# Patient Record
Sex: Male | Born: 1970 | Race: Black or African American | Hispanic: No | Marital: Married | Smoking: Former smoker
Health system: Southern US, Community
[De-identification: ages and names within clinical notes are randomized; demographics above are authoritative.]

## PROBLEM LIST (undated history)

## (undated) DIAGNOSIS — K219 Gastro-esophageal reflux disease without esophagitis: Secondary | ICD-10-CM

## (undated) DIAGNOSIS — C9000 Multiple myeloma not having achieved remission: Secondary | ICD-10-CM

## (undated) DIAGNOSIS — Z923 Personal history of irradiation: Secondary | ICD-10-CM

## (undated) DIAGNOSIS — C801 Malignant (primary) neoplasm, unspecified: Secondary | ICD-10-CM

## (undated) DIAGNOSIS — Z828 Family history of other disabilities and chronic diseases leading to disablement, not elsewhere classified: Secondary | ICD-10-CM

## (undated) DIAGNOSIS — IMO0002 Reserved for concepts with insufficient information to code with codable children: Secondary | ICD-10-CM

## (undated) DIAGNOSIS — IMO0001 Reserved for inherently not codable concepts without codable children: Secondary | ICD-10-CM

## (undated) DIAGNOSIS — Z8489 Family history of other specified conditions: Secondary | ICD-10-CM

## (undated) HISTORY — DX: Personal history of irradiation: Z92.3

## (undated) HISTORY — PX: PORTACATH PLACEMENT: SHX2246

---

## 1997-12-31 ENCOUNTER — Encounter: Admission: RE | Admit: 1997-12-31 | Discharge: 1997-12-31 | Payer: Self-pay | Admitting: *Deleted

## 1998-06-04 ENCOUNTER — Encounter: Payer: Self-pay | Admitting: Emergency Medicine

## 1998-06-04 ENCOUNTER — Emergency Department (HOSPITAL_COMMUNITY): Admission: EM | Admit: 1998-06-04 | Discharge: 1998-06-04 | Payer: Self-pay | Admitting: Emergency Medicine

## 1998-06-06 ENCOUNTER — Emergency Department (HOSPITAL_COMMUNITY): Admission: EM | Admit: 1998-06-06 | Discharge: 1998-06-06 | Payer: Self-pay

## 1999-01-20 ENCOUNTER — Encounter: Payer: Self-pay | Admitting: Emergency Medicine

## 1999-01-20 ENCOUNTER — Emergency Department (HOSPITAL_COMMUNITY): Admission: EM | Admit: 1999-01-20 | Discharge: 1999-01-20 | Payer: Self-pay | Admitting: Emergency Medicine

## 1999-05-15 ENCOUNTER — Emergency Department (HOSPITAL_COMMUNITY): Admission: EM | Admit: 1999-05-15 | Discharge: 1999-05-15 | Payer: Self-pay | Admitting: Emergency Medicine

## 1999-05-15 ENCOUNTER — Encounter: Payer: Self-pay | Admitting: Emergency Medicine

## 1999-06-19 ENCOUNTER — Emergency Department (HOSPITAL_COMMUNITY): Admission: EM | Admit: 1999-06-19 | Discharge: 1999-06-19 | Payer: Self-pay | Admitting: Emergency Medicine

## 2000-11-05 ENCOUNTER — Emergency Department (HOSPITAL_COMMUNITY): Admission: EM | Admit: 2000-11-05 | Discharge: 2000-11-05 | Payer: Self-pay | Admitting: Emergency Medicine

## 2000-11-05 ENCOUNTER — Encounter: Payer: Self-pay | Admitting: Emergency Medicine

## 2001-09-28 ENCOUNTER — Emergency Department (HOSPITAL_COMMUNITY): Admission: EM | Admit: 2001-09-28 | Discharge: 2001-09-28 | Payer: Self-pay | Admitting: Emergency Medicine

## 2001-10-01 ENCOUNTER — Encounter: Payer: Self-pay | Admitting: Emergency Medicine

## 2001-10-01 ENCOUNTER — Emergency Department (HOSPITAL_COMMUNITY): Admission: EM | Admit: 2001-10-01 | Discharge: 2001-10-01 | Payer: Self-pay | Admitting: *Deleted

## 2010-05-24 ENCOUNTER — Emergency Department (HOSPITAL_COMMUNITY): Admission: EM | Admit: 2010-05-24 | Discharge: 2010-05-24 | Payer: Self-pay | Admitting: Emergency Medicine

## 2010-10-13 LAB — DIFFERENTIAL
Basophils Relative: 1 % (ref 0–1)
Eosinophils Relative: 2 % (ref 0–5)
Lymphocytes Relative: 41 % (ref 12–46)
Monocytes Absolute: 0.2 10*3/uL (ref 0.1–1.0)
Monocytes Relative: 6 % (ref 3–12)
Neutro Abs: 1.8 10*3/uL (ref 1.7–7.7)

## 2010-10-13 LAB — BASIC METABOLIC PANEL
Chloride: 109 mEq/L (ref 96–112)
GFR calc non Af Amer: >60 mL/min (ref >60)
Glucose, Bld: 94 mg/dL (ref 70–99)
Potassium: 4.1 mEq/L (ref 3.5–5.1)
Sodium: 140 mEq/L (ref 135–145)

## 2010-10-13 LAB — URINALYSIS, ROUTINE W REFLEX MICROSCOPIC
Bilirubin Urine: NEGATIVE
Glucose, UA: NEGATIVE mg/dL
Hgb urine dipstick: NEGATIVE
Ketones, ur: NEGATIVE mg/dL
Nitrite: NEGATIVE
Protein, ur: NEGATIVE mg/dL
Specific Gravity, Urine: 1.015 (ref 1.005–1.030)
Urobilinogen, UA: 0.2 mg/dL (ref 0.0–1.0)
pH: 6 (ref 5.0–8.0)

## 2010-10-13 LAB — CBC
HCT: 39.1 % (ref 39.0–52.0)
Hemoglobin: 13.4 g/dL (ref 13.0–17.0)
MCHC: 34.3 g/dL (ref 30.0–36.0)
MCV: 90 fL (ref 78.0–100.0)

## 2011-02-20 ENCOUNTER — Emergency Department (HOSPITAL_COMMUNITY)
Admission: EM | Admit: 2011-02-20 | Discharge: 2011-02-20 | Disposition: A | Discharge status: Home / Self Care | Payer: PRIVATE HEALTH INSURANCE | Attending: Emergency Medicine | Admitting: Emergency Medicine

## 2011-02-20 ENCOUNTER — Emergency Department (HOSPITAL_COMMUNITY): Payer: PRIVATE HEALTH INSURANCE

## 2011-02-20 DIAGNOSIS — S46909A Unspecified injury of unspecified muscle, fascia and tendon at shoulder and upper arm level, unspecified arm, initial encounter: Secondary | ICD-10-CM | POA: Insufficient documentation

## 2011-02-20 DIAGNOSIS — W010XXA Fall on same level from slipping, tripping and stumbling without subsequent striking against object, initial encounter: Secondary | ICD-10-CM | POA: Insufficient documentation

## 2011-02-20 DIAGNOSIS — M25519 Pain in unspecified shoulder: Secondary | ICD-10-CM | POA: Insufficient documentation

## 2011-02-20 DIAGNOSIS — S4980XA Other specified injuries of shoulder and upper arm, unspecified arm, initial encounter: Secondary | ICD-10-CM | POA: Insufficient documentation

## 2011-02-20 DIAGNOSIS — Y92009 Unspecified place in unspecified non-institutional (private) residence as the place of occurrence of the external cause: Secondary | ICD-10-CM | POA: Insufficient documentation

## 2011-04-16 ENCOUNTER — Emergency Department (HOSPITAL_COMMUNITY)
Admission: EM | Admit: 2011-04-16 | Discharge: 2011-04-16 | Disposition: A | Discharge status: Home / Self Care | Payer: PRIVATE HEALTH INSURANCE | Attending: Emergency Medicine | Admitting: Emergency Medicine

## 2011-04-16 DIAGNOSIS — T394X2A Poisoning by antirheumatics, not elsewhere classified, intentional self-harm, initial encounter: Secondary | ICD-10-CM | POA: Insufficient documentation

## 2011-04-16 DIAGNOSIS — T391X1A Poisoning by 4-Aminophenol derivatives, accidental (unintentional), initial encounter: Secondary | ICD-10-CM | POA: Insufficient documentation

## 2011-04-16 DIAGNOSIS — R45851 Suicidal ideations: Secondary | ICD-10-CM | POA: Insufficient documentation

## 2011-04-16 DIAGNOSIS — R109 Unspecified abdominal pain: Secondary | ICD-10-CM | POA: Insufficient documentation

## 2011-04-16 LAB — RAPID URINE DRUG SCREEN, HOSP PERFORMED
Opiates: NOT DETECTED
Tetrahydrocannabinol: NOT DETECTED

## 2011-04-16 LAB — COMPREHENSIVE METABOLIC PANEL
BUN: 16 mg/dL (ref 6–23)
Calcium: 9.4 mg/dL (ref 8.4–10.5)
Creatinine, Ser: 1 mg/dL (ref 0.50–1.35)
GFR calc Af Amer: >60 mL/min (ref >60)
Glucose, Bld: 83 mg/dL (ref 70–99)
Total Protein: 7.1 g/dL (ref 6.0–8.3)

## 2011-04-16 LAB — URINALYSIS, ROUTINE W REFLEX MICROSCOPIC
Hgb urine dipstick: NEGATIVE
Protein, ur: NEGATIVE mg/dL
Urobilinogen, UA: 0.2 mg/dL (ref 0.0–1.0)

## 2011-04-16 LAB — CBC
HCT: 39.7 % (ref 39.0–52.0)
MCHC: 35.3 g/dL (ref 30.0–36.0)
MCV: 88 fL (ref 78.0–100.0)
RDW: 12.4 % (ref 11.5–15.5)

## 2011-04-16 LAB — DIFFERENTIAL
Eosinophils Absolute: 0.1 10*3/uL (ref 0.0–0.7)
Eosinophils Relative: 1 % (ref 0–5)
Lymphocytes Relative: 42 % (ref 12–46)
Lymphs Abs: 1.8 10*3/uL (ref 0.7–4.0)
Monocytes Absolute: 0.4 10*3/uL (ref 0.1–1.0)

## 2011-04-16 LAB — SALICYLATE LEVEL: Salicylate Lvl: <2 mg/dL — ABNORMAL LOW (ref 2.8–20.0)

## 2011-04-16 LAB — ETHANOL: Alcohol, Ethyl (B): <11 mg/dL (ref 0–11)

## 2011-04-17 LAB — ACETAMINOPHEN LEVEL: Acetaminophen (Tylenol), Serum: 24 ug/mL (ref 10–30)

## 2011-11-13 ENCOUNTER — Emergency Department (HOSPITAL_BASED_OUTPATIENT_CLINIC_OR_DEPARTMENT_OTHER)
Admission: EM | Admit: 2011-11-13 | Discharge: 2011-11-13 | Disposition: A | Discharge status: Home / Self Care | Payer: PRIVATE HEALTH INSURANCE | Attending: Emergency Medicine | Admitting: Emergency Medicine

## 2011-11-13 ENCOUNTER — Encounter (HOSPITAL_BASED_OUTPATIENT_CLINIC_OR_DEPARTMENT_OTHER): Payer: Self-pay | Admitting: *Deleted

## 2011-11-13 DIAGNOSIS — H5789 Other specified disorders of eye and adnexa: Secondary | ICD-10-CM | POA: Insufficient documentation

## 2011-11-13 DIAGNOSIS — H11419 Vascular abnormalities of conjunctiva, unspecified eye: Secondary | ICD-10-CM | POA: Insufficient documentation

## 2011-11-13 DIAGNOSIS — H109 Unspecified conjunctivitis: Secondary | ICD-10-CM | POA: Insufficient documentation

## 2011-11-13 MED ORDER — CIPROFLOXACIN HCL 0.3 % OP SOLN
2.0000 [drp] | OPHTHALMIC | Status: DC
  Administered 2011-11-13: 2 [drp] via OPHTHALMIC
  Filled 2011-11-13: qty 2.5

## 2011-11-13 MED ORDER — FLUORESCEIN SODIUM 1 MG OP STRP
1.0000 | ORAL_STRIP | Freq: Once | OPHTHALMIC | Status: DC
  Filled 2011-11-13: qty 1

## 2011-11-13 MED ORDER — TETRACAINE HCL 0.5 % OP SOLN
1.0000 [drp] | Freq: Once | OPHTHALMIC | Status: DC
  Filled 2011-11-13: qty 2

## 2011-11-13 NOTE — ED Provider Notes (Signed)
History     CSN: [id number]  Arrival date & time 11/13/11  2012   First MD Initiated Contact with Patient 11/13/11 2132      Chief Complaint  Patient presents with  . Eye Injury    (Consider location/radiation/quality/duration/timing/severity/associated sxs/prior treatment) HPI Comments: Pt states that he was loading a trunk earlier today and he felt like something flew in his right eye:pt states that he has washed it out and he still has the sensation  Patient is a 41 y.o. male presenting with eye injury. The history is provided by the patient. No language interpreter was used.  Eye Injury This is a new problem. The current episode started today. The problem occurs constantly. The problem has been unchanged. The symptoms are aggravated by nothing. He has tried nothing for the symptoms.    History reviewed. No pertinent past medical history.  History reviewed. No pertinent past surgical history.  History reviewed. No pertinent family history.  History  Substance Use Topics  . Smoking status: Never Smoker   . Smokeless tobacco: Not on file  . Alcohol Use: No      Review of Systems  Constitutional: Negative.   HENT: Negative.   Eyes: Positive for redness.  Respiratory: Negative.   Cardiovascular: Negative.   Neurological: Negative.     Allergies  Review of patient's allergies indicates no known allergies.  Home Medications   Current Outpatient Rx  Name Route Sig Dispense Refill  . ACETAMINOPHEN 500 MG PO TABS Oral Take 1,000 mg by mouth every 6 (six) hours as needed. Patient used this medication for pain.    Marland Kitchen VITAMIN C 100 MG PO TABS Oral Take 100 mg by mouth daily.    . IBUPROFEN 200 MG PO TABS Oral Take 400 mg by mouth every 6 (six) hours as needed. Patient used this medication for pain.    Marland Kitchen ALKA-SELTZER PLUS SINUS PO Oral Take 2 tablets by mouth daily as needed. Patient used this medication for cold and sinus issues.      BP 127/86  Pulse 95  Temp(Src)  99.2 F (37.3 C) (Oral)  Ht 6' (1.829 m)  Wt 200 lb (90.719 kg)  BMI 27.12 kg/m2  SpO2 98%  Physical Exam  Nursing note and vitals reviewed. Constitutional: He is oriented to person, place, and time. He appears well-developed and well-nourished.  HENT:  Head: Normocephalic and atraumatic.  Eyes: EOM are normal. Pupils are equal, round, and reactive to light. Right conjunctiva is injected.  Slit lamp exam:      The right eye shows no fluorescein uptake.  Neck: Neck supple.  Cardiovascular: Normal rate and regular rhythm.   Pulmonary/Chest: Effort normal and breath sounds normal.  Musculoskeletal: Normal range of motion.  Neurological: He is alert and oriented to person, place, and time.    ED Course  Procedures (including critical care time)  Labs Reviewed - No data to display No results found.   1. Conjunctivitis       MDM  No sign of fb or abrasion noted:will treat as pt eye is very injected and inflamed at this point:pt given optho referal        Teressa Lower, NP 11/14/11 0009

## 2011-11-13 NOTE — ED Notes (Signed)
Pt states that while at work this Pm something got into his right eye pt states that despite irrigating eye he still feels as though something is in his eye pt with tearing and redness as well as pain

## 2011-11-13 NOTE — Discharge Instructions (Signed)
Conjunctivitis Conjunctivitis is commonly called "pink eye." Conjunctivitis can be caused by bacterial or viral infection, allergies, or injuries. There is usually redness of the lining of the eye, itching, discomfort, and sometimes discharge. There may be deposits of matter along the eyelids. A viral infection usually causes a watery discharge, while a bacterial infection causes a yellowish, thick discharge. Pink eye is very contagious and spreads by direct contact. You may be given antibiotic eyedrops as part of your treatment. Before using your eye medicine, remove all drainage from the eye by washing gently with warm water and cotton balls. Continue to use the medication until you have awakened 2 mornings in a row without discharge from the eye. Do not rub your eye. This increases the irritation and helps spread infection. Use separate towels from other household members. Wash your hands with soap and water before and after touching your eyes. Use cold compresses to reduce pain and sunglasses to relieve irritation from light. Do not wear contact lenses or wear eye makeup until the infection is gone. SEEK MEDICAL CARE IF:   Your symptoms are not better after 3 days of treatment.   You have increased pain or trouble seeing.   The outer eyelids become very red or swollen.  Document Released: 08/25/2004 Document Revised: 07/07/2011 Document Reviewed: 07/18/2005 [employer] Patient Information 2012 [employer]. 

## 2011-11-14 NOTE — ED Provider Notes (Signed)
Medical screening examination/treatment/procedure(s) were performed by non-physician practitioner and as supervising physician I was immediately available for consultation/collaboration.   Forbes Cellar, MD 11/14/11 [phone]

## 2012-11-02 ENCOUNTER — Emergency Department (HOSPITAL_COMMUNITY)
Admission: EM | Admit: 2012-11-02 | Discharge: 2012-11-02 | Disposition: A | Discharge status: Home / Self Care | Payer: BC Managed Care – PPO | Attending: Emergency Medicine | Admitting: Emergency Medicine

## 2012-11-02 ENCOUNTER — Encounter (HOSPITAL_COMMUNITY): Payer: Self-pay | Admitting: Emergency Medicine

## 2012-11-02 ENCOUNTER — Emergency Department (HOSPITAL_COMMUNITY): Payer: BC Managed Care – PPO

## 2012-11-02 DIAGNOSIS — S20211A Contusion of right front wall of thorax, initial encounter: Secondary | ICD-10-CM

## 2012-11-02 DIAGNOSIS — Y9367 Activity, basketball: Secondary | ICD-10-CM | POA: Insufficient documentation

## 2012-11-02 DIAGNOSIS — S20219A Contusion of unspecified front wall of thorax, initial encounter: Secondary | ICD-10-CM | POA: Insufficient documentation

## 2012-11-02 DIAGNOSIS — Y9239 Other specified sports and athletic area as the place of occurrence of the external cause: Secondary | ICD-10-CM | POA: Insufficient documentation

## 2012-11-02 DIAGNOSIS — R296 Repeated falls: Secondary | ICD-10-CM | POA: Insufficient documentation

## 2012-11-02 MED ORDER — IBUPROFEN 600 MG PO TABS: 600.0000 mg | ORAL_TABLET | Freq: Four times a day (QID) | ORAL | Status: DC | PRN

## 2012-11-02 MED ORDER — TRAMADOL HCL 50 MG PO TABS: 50.0000 mg | ORAL_TABLET | Freq: Four times a day (QID) | ORAL | Status: DC | PRN

## 2012-11-02 NOTE — ED Provider Notes (Signed)
History    This chart was scribed for non-physician practitioner working with Vida Roller, MD by Leone Payor, ED Scribe. This patient was seen in room WTR6/WTR6 and the patient's care was started at 1645.   CSN: [id number]  Arrival date & time 11/02/12  1645   None     Chief Complaint  Patient presents with  . Pain    Right ribcage     The history is provided by the patient. No language interpreter was used.    Mark [name] is a 42 y.o. male who presents to the Emergency Department complaining of ongoing, right rib area pain starting 2 days ago after fall while playing basketball. Pt states the pain is sharp and rates pain as an 8/10. Pt states he was in pain the day of onset but states the pain was reduced with ibuprofen. The pain returned today while at work. He denies taking ibuprofen today. He denies hemoptysis. Pt states that twisting, deep breathing, and coughing aggravate the pain.   Pt denies smoking and alcohol use.  History reviewed. No pertinent past medical history.  History reviewed. No pertinent past surgical history.  No family history on file.  History  Substance Use Topics  . Smoking status: Never Smoker   . Smokeless tobacco: Not on file  . Alcohol Use: No      Review of Systems  Constitutional: Negative for fever.  HENT: Negative for sore throat and rhinorrhea.   Eyes: Negative for redness.  Respiratory: Negative for cough and shortness of breath.        No hemoptysis  Cardiovascular: Positive for chest pain.  Gastrointestinal: Negative for nausea, vomiting, abdominal pain and diarrhea.  Genitourinary: Negative for dysuria.  Musculoskeletal: Positive for arthralgias (right rib area). Negative for myalgias.  Skin: Negative for rash.  Neurological: Negative for headaches.    Allergies  Review of patient's allergies indicates no known allergies.  Home Medications   Current Outpatient Rx  Name  Route  Sig  Dispense  Refill  . acetaminophen  (TYLENOL) 500 MG tablet   Oral   Take 1,000 mg by mouth every 6 (six) hours as needed. Patient used this medication for pain.         . Ascorbic Acid (VITAMIN C) 100 MG tablet   Oral   Take 100 mg by mouth daily.         Marland Kitchen ibuprofen (ADVIL,MOTRIN) 200 MG tablet   Oral   Take 400 mg by mouth every 6 (six) hours as needed. Patient used this medication for pain.         Marland Kitchen Phenylephrine-Aspirin (ALKA-SELTZER PLUS SINUS PO)   Oral   Take 2 tablets by mouth daily as needed. Patient used this medication for cold and sinus issues.           BP 134/77  Pulse 72  Temp(Src) 98.7 F (37.1 C) (Oral)  Resp 20  Wt 200 lb (90.719 kg)  BMI 27.12 kg/m2  SpO2 98%  Physical Exam  Nursing note and vitals reviewed. Constitutional: He appears well-developed and well-nourished. No distress.  HENT:  Head: Normocephalic and atraumatic.  Eyes: EOM are normal.  Neck: Neck supple. No tracheal deviation present.  Cardiovascular: Normal rate.   Pulmonary/Chest: Effort normal and breath sounds normal. No respiratory distress. He has no wheezes. He has no rales. He exhibits no tenderness.  Good expansion  Musculoskeletal: Normal range of motion.  Tenderness to right inferolateral ribs without bruising or deformity.  Neurological: He is alert.  Skin: Skin is warm and dry.  Psychiatric: He has a normal mood and affect. His behavior is normal.    ED Course  Procedures (including critical care time)  DIAGNOSTIC STUDIES: Oxygen Saturation is 98% on room air, normal by my interpretation.    COORDINATION OF CARE: 6:06 PM Discussed treatment plan with pt at bedside and pt agreed to plan.    Labs Reviewed - No data to display Dg Ribs Unilateral W/chest Right  11/02/2012  *RADIOLOGY REPORT*  Clinical Data: Anterior and lateral pain.  Fell while playing basketball.  Shortness of breath.  RIGHT RIBS AND CHEST - 3+ VIEW  Comparison: None.  Findings: Heart size is normal.  Lungs are free of focal  consolidations.  There is mild right base atelectasis and right apical pleural scarring.  No evidence for acute fracture.  IMPRESSION: No evidence for acute  abnormality.   Original Report Authenticated By: Norva Pavlov, M.D.      1. Rib contusion, right, initial encounter    6:18 PM Patient seen and examined. Patient informed of x-ray results.   Vital signs reviewed and are as follows: Filed Vitals:   11/02/12 1713  BP: 134/77  Pulse: 72  Temp: 98.7 F (37.1 C)  Resp: 20   Patient encouraged to take 10 deep breaths every hour to fully expanded lungs. Patient urged to return with worsening shortness of breath or trouble breathing. Patient counseled on use of ibuprofen.    MDM  Rib contusion. X-ray shows no pneumothorax or other abnormality of long. Patient has normal breath sounds, good expansion, no respiratory difficulty.  I personally performed the services described in this documentation, which was scribed in my presence. The recorded information has been reviewed and is accurate.    Renne Crigler, PA-C 11/02/12 [phone]

## 2012-11-02 NOTE — Progress Notes (Signed)
[facility] ED CM noted pt with coverage but no pcp listed [facility] ED CM spoke with pt on how to obtain an in network pcp with insurance coverage via the customer service number or web site   

## 2012-11-02 NOTE — ED Notes (Signed)
Patient fell while playing basketball on Wednesday injuring right rib area.  Pain is sharp and rates pain as an 8/10.

## 2012-11-02 NOTE — ED Provider Notes (Signed)
Medical screening examination/treatment/procedure(s) were performed by non-physician practitioner and as supervising physician I was immediately available for consultation/collaboration.    Vida Roller, MD 11/02/12 [phone]

## 2012-11-26 ENCOUNTER — Emergency Department (HOSPITAL_COMMUNITY): Payer: BC Managed Care – PPO

## 2012-11-26 ENCOUNTER — Encounter (HOSPITAL_COMMUNITY): Payer: Self-pay | Admitting: Emergency Medicine

## 2012-11-26 ENCOUNTER — Emergency Department (HOSPITAL_COMMUNITY)
Admission: EM | Admit: 2012-11-26 | Discharge: 2012-11-26 | Disposition: A | Discharge status: Home / Self Care | Payer: BC Managed Care – PPO | Attending: Emergency Medicine | Admitting: Emergency Medicine

## 2012-11-26 DIAGNOSIS — G8911 Acute pain due to trauma: Secondary | ICD-10-CM | POA: Insufficient documentation

## 2012-11-26 DIAGNOSIS — M549 Dorsalgia, unspecified: Secondary | ICD-10-CM

## 2012-11-26 DIAGNOSIS — M545 Low back pain, unspecified: Secondary | ICD-10-CM | POA: Insufficient documentation

## 2012-11-26 MED ORDER — IBUPROFEN 800 MG PO TABS: 800.0000 mg | ORAL_TABLET | Freq: Three times a day (TID) | ORAL | Status: DC

## 2012-11-26 MED ORDER — DIAZEPAM 5 MG PO TABS
5.0000 mg | ORAL_TABLET | Freq: Once | ORAL | Status: AC
  Administered 2012-11-26: 5 mg via ORAL
  Filled 2012-11-26: qty 1

## 2012-11-26 MED ORDER — KETOROLAC TROMETHAMINE 60 MG/2ML IM SOLN
60.0000 mg | Freq: Once | INTRAMUSCULAR | Status: AC
  Administered 2012-11-26: 60 mg via INTRAMUSCULAR
  Filled 2012-11-26: qty 2

## 2012-11-26 MED ORDER — CYCLOBENZAPRINE HCL 10 MG PO TABS: 10.0000 mg | ORAL_TABLET | Freq: Two times a day (BID) | ORAL | Status: DC | PRN

## 2012-11-26 NOTE — ED Provider Notes (Signed)
History     CSN: [id number]  Arrival date & time 11/26/12  7829   First MD Initiated Contact with Patient 11/26/12 1830      Chief Complaint  Patient presents with  . Back Pain    (Consider location/radiation/quality/duration/timing/severity/associated sxs/prior treatment) HPI Comments: Patient is a 42 year old male who presents with gradual onset of lower back pain that started this morning. The pain is aching and severe and radiates down his right leg. The pain is constant. Movement makes the pain worse as well as sitting. Nothing makes the pain better. Patient has not tried anything for pain. No associated symptoms. No saddles paresthesias or bladder/bowel incontinence.     Patient is a 42 y.o. male presenting with back pain.  Back Pain   History reviewed. No pertinent past medical history.  History reviewed. No pertinent past surgical history.  No family history on file.  History  Substance Use Topics  . Smoking status: Never Smoker   . Smokeless tobacco: Not on file  . Alcohol Use: No      Review of Systems  Musculoskeletal: Positive for back pain.  All other systems reviewed and are negative.    Allergies  Review of patient's allergies indicates no known allergies.  Home Medications   Current Outpatient Rx  Name  Route  Sig  Dispense  Refill  . ibuprofen (ADVIL,MOTRIN) 600 MG tablet   Oral   Take 1 tablet (600 mg total) by mouth every 6 (six) hours as needed for pain.   20 tablet   0     BP 141/86  Pulse 69  Temp(Src) 97.9 F (36.6 C) (Oral)  Resp 20  Wt 200 lb (90.719 kg)  BMI 27.12 kg/m2  SpO2 97%  Physical Exam  Nursing note and vitals reviewed. Constitutional: He is oriented to person, place, and time. He appears well-developed and well-nourished. No distress.  HENT:  Head: Normocephalic and atraumatic.  Eyes: Conjunctivae are normal.  Neck: Normal range of motion.  Cardiovascular: Normal rate and regular rhythm.  Exam reveals no  gallop and no friction rub.   No murmur heard. Pulmonary/Chest: Effort normal and breath sounds normal. He has no wheezes. He has no rales. He exhibits no tenderness.  Abdominal: Soft. There is no tenderness.  Musculoskeletal: Normal range of motion.  Paraspinal lumbosacral tenderness to palpation. No midline spine tenderness to palpation.   Neurological: He is alert and oriented to person, place, and time. Coordination normal.  Extremity strength and sensation equal and intact bilaterally. Speech is goal-oriented. Moves limbs without ataxia.   Skin: Skin is warm and dry.  Psychiatric: He has a normal mood and affect. His behavior is normal.    ED Course  Procedures (including critical care time)  Labs Reviewed - No data to display Dg Lumbar Spine Complete  11/26/2012  *RADIOLOGY REPORT*  Clinical Data: Back pain and right leg pain.  Injured several weeks ago.  LUMBAR SPINE - COMPLETE 4+ VIEW  Comparison: CT 05/24/2010  Findings: Five lumbar-type vertebral bodies show normal alignment. Disc space heights are within normal limits.  No evidence of facet arthropathy or pars defect.  No other focal finding.  Sacroiliac joints appear normal.  IMPRESSION: Normal radiographs   Original Report Authenticated By: Paulina Fusi, M.D.      1. Back pain       MDM  7:37 PM Xray of lumbar spine unremarkable. Patient will have toradol and valium for symptoms. No bladder/bowel incontinence or saddle paresthesias. Patient  able to ambulate without difficulty.         Emilia Beck, PA-C 11/26/12 1946

## 2012-11-26 NOTE — ED Notes (Signed)
Patient with right sided lower back pain radiating down right leg.  Thigh feels numb at times.  Patient reports he can't sit in one position for very long.  Seen here 2-3 weeks ago for a rib injury while playing ball.  Back pain started about two weeks ago, and patient has never had it before.

## 2012-12-04 NOTE — ED Provider Notes (Signed)
Medical screening examination/treatment/procedure(s) were performed by non-physician practitioner and as supervising physician I was immediately available for consultation/collaboration.  Raeford Razor, MD 12/04/12 [phone]

## 2013-01-16 ENCOUNTER — Encounter (HOSPITAL_BASED_OUTPATIENT_CLINIC_OR_DEPARTMENT_OTHER): Payer: Self-pay | Admitting: Emergency Medicine

## 2013-01-16 ENCOUNTER — Emergency Department (HOSPITAL_BASED_OUTPATIENT_CLINIC_OR_DEPARTMENT_OTHER)
Admission: EM | Admit: 2013-01-16 | Discharge: 2013-01-16 | Disposition: A | Discharge status: Home / Self Care | Payer: Worker's Compensation | Attending: Emergency Medicine | Admitting: Emergency Medicine

## 2013-01-16 ENCOUNTER — Emergency Department (HOSPITAL_BASED_OUTPATIENT_CLINIC_OR_DEPARTMENT_OTHER): Payer: Worker's Compensation

## 2013-01-16 DIAGNOSIS — Y9389 Activity, other specified: Secondary | ICD-10-CM | POA: Insufficient documentation

## 2013-01-16 DIAGNOSIS — Y990 Civilian activity done for income or pay: Secondary | ICD-10-CM | POA: Insufficient documentation

## 2013-01-16 DIAGNOSIS — Y9289 Other specified places as the place of occurrence of the external cause: Secondary | ICD-10-CM | POA: Insufficient documentation

## 2013-01-16 DIAGNOSIS — S20221A Contusion of right back wall of thorax, initial encounter: Secondary | ICD-10-CM

## 2013-01-16 DIAGNOSIS — R296 Repeated falls: Secondary | ICD-10-CM | POA: Insufficient documentation

## 2013-01-16 DIAGNOSIS — S20229A Contusion of unspecified back wall of thorax, initial encounter: Secondary | ICD-10-CM | POA: Insufficient documentation

## 2013-01-16 MED ORDER — IBUPROFEN 800 MG PO TABS: 800.0000 mg | ORAL_TABLET | Freq: Three times a day (TID) | ORAL | Status: DC

## 2013-01-16 MED ORDER — CYCLOBENZAPRINE HCL 10 MG PO TABS: 10.0000 mg | ORAL_TABLET | Freq: Two times a day (BID) | ORAL | Status: DC | PRN

## 2013-01-16 MED ORDER — TRAMADOL HCL 50 MG PO TABS: 50.0000 mg | ORAL_TABLET | Freq: Four times a day (QID) | ORAL | Status: DC | PRN

## 2013-01-16 NOTE — ED Provider Notes (Signed)
History     CSN: [id number]  Arrival date & time 01/16/13  1954   First MD Initiated Contact with Patient 01/16/13 2005      Chief Complaint  Patient presents with  . Fall  . Back Injury  . Workers comp     (Consider location/radiation/quality/duration/timing/severity/associated sxs/prior treatment) HPI Comments: Patient comes to the ER for evaluation of back injury. Patient reports that he had an injury at work earlier today. Patient was walking, pushing a hand truck. He lost his balance and fell backwards. Patient complaining of low back pain, more right than left. He did not hit his head. No loss of consciousness. Patient denies headache and neck pain. No numbness or tingling to lower extremities. No change in bowel or bladder function.  Patient is a 42 y.o. male presenting with fall.  Fall Pertinent negatives include no headaches.    No past medical history on file.  No past surgical history on file.  No family history on file.  History  Substance Use Topics  . Smoking status: Never Smoker   . Smokeless tobacco: Not on file  . Alcohol Use: No      Review of Systems  Musculoskeletal: Positive for back pain.  Neurological: Negative for headaches.  Hematological: Negative.   All other systems reviewed and are negative.    Allergies  Review of patient's allergies indicates no known allergies.  Home Medications  No current outpatient prescriptions on file.  BP 114/75  Pulse 91  Temp(Src) 98.9 F (37.2 C) (Oral)  Resp 16  Ht 6' (1.829 m)  Wt 205 lb (92.987 kg)  BMI 27.8 kg/m2  SpO2 99%  Physical Exam  Constitutional: He is oriented to person, place, and time. He appears well-developed and well-nourished. No distress.  HENT:  Head: Normocephalic and atraumatic.  Right Ear: Hearing normal.  Left Ear: Hearing normal.  Nose: Nose normal.  Mouth/Throat: Oropharynx is clear and moist and mucous membranes are normal.  Eyes: Conjunctivae and EOM are  normal. Pupils are equal, round, and reactive to light.  Neck: Normal range of motion. Neck supple.  Cardiovascular: Regular rhythm, S1 normal and S2 normal.  Exam reveals no gallop and no friction rub.   No murmur heard. Pulmonary/Chest: Effort normal and breath sounds normal. No respiratory distress. He exhibits no tenderness.  Abdominal: Soft. Normal appearance and bowel sounds are normal. There is no hepatosplenomegaly. There is no tenderness. There is no rebound, no guarding, no tenderness at McBurney's point and negative Murphy's sign. No hernia.  Musculoskeletal: Normal range of motion.       Lumbar back: He exhibits tenderness and spasm. He exhibits normal range of motion, no bony tenderness, no swelling and no deformity.  Neurological: He is alert and oriented to person, place, and time. He has normal strength. No cranial nerve deficit or sensory deficit. Coordination normal. GCS eye subscore is 4. GCS verbal subscore is 5. GCS motor subscore is 6.  Skin: Skin is warm, dry and intact. No rash noted. No cyanosis.  Psychiatric: He has a normal mood and affect. His speech is normal and behavior is normal. Thought content normal.    ED Course  Procedures (including critical care time)  Labs Reviewed - No data to display Dg Lumbar Spine Complete  01/16/2013   *RADIOLOGY REPORT*  Clinical Data: Fall, back pain.  LUMBAR SPINE - COMPLETE 4+ VIEW  Comparison: None.  Findings: There are five lumbar-type vertebral bodies.  No fracture or malalignment.  Disc spaces  well maintained.  SI joints are symmetric.  IMPRESSION: No acute bony abnormality.   Original Report Authenticated By: Charlett Nose, M.D.     Diagnosis: Back strain/contusion    MDM  Patient presents with low back pain after a fall. Pain is mostly in the lower back, on the right side. No step-off or defect on examination in the midline lumbar. No neck or upper back tenderness. X-ray of the lumbar spine was unremarkable. Patient's  neurologic exam including strength, sensation is normal. Patient to be treated with rest and analgesia.        Gilda Crease, MD 01/16/13 2045

## 2013-01-16 NOTE — ED Notes (Signed)
Patient transported to X-ray 

## 2013-01-16 NOTE — ED Notes (Signed)
Pt fell at work.  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  Pt having lower back pain and slight right shoulder pain.  No head injury or LOC.

## 2013-01-21 ENCOUNTER — Emergency Department (HOSPITAL_COMMUNITY)
Admission: EM | Admit: 2013-01-21 | Discharge: 2013-01-21 | Disposition: A | Discharge status: Home / Self Care | Payer: BC Managed Care – PPO | Attending: Emergency Medicine | Admitting: Emergency Medicine

## 2013-01-21 ENCOUNTER — Encounter (HOSPITAL_COMMUNITY): Payer: Self-pay | Admitting: *Deleted

## 2013-01-21 DIAGNOSIS — Y929 Unspecified place or not applicable: Secondary | ICD-10-CM | POA: Insufficient documentation

## 2013-01-21 DIAGNOSIS — S61209A Unspecified open wound of unspecified finger without damage to nail, initial encounter: Secondary | ICD-10-CM | POA: Insufficient documentation

## 2013-01-21 DIAGNOSIS — Y9389 Activity, other specified: Secondary | ICD-10-CM | POA: Insufficient documentation

## 2013-01-21 DIAGNOSIS — IMO0002 Reserved for concepts with insufficient information to code with codable children: Secondary | ICD-10-CM

## 2013-01-21 DIAGNOSIS — W268XXA Contact with other sharp object(s), not elsewhere classified, initial encounter: Secondary | ICD-10-CM | POA: Insufficient documentation

## 2013-01-21 DIAGNOSIS — Z23 Encounter for immunization: Secondary | ICD-10-CM | POA: Insufficient documentation

## 2013-01-21 DIAGNOSIS — Z791 Long term (current) use of non-steroidal anti-inflammatories (NSAID): Secondary | ICD-10-CM | POA: Insufficient documentation

## 2013-01-21 MED ORDER — TETANUS-DIPHTH-ACELL PERTUSSIS 5-2.5-18.5 LF-MCG/0.5 IM SUSP
0.5000 mL | Freq: Once | INTRAMUSCULAR | Status: AC
  Administered 2013-01-21: 0.5 mL via INTRAMUSCULAR
  Filled 2013-01-21: qty 0.5

## 2013-01-21 NOTE — ED Notes (Signed)
Cut left index finger on picture frame, bleeding controlled

## 2013-01-21 NOTE — ED Provider Notes (Signed)
History     CSN: [id number]  Arrival date & time 01/21/13  0019   First MD Initiated Contact with Patient 01/21/13 [phone]      Chief Complaint  Patient presents with  . Laceration    (Consider location/radiation/quality/duration/timing/severity/associated sxs/prior treatment) HPI Comments: Cut left index finger on picture frame about 2 hours ago  Patient is a 42 y.o. male presenting with skin laceration. The history is provided by the patient.  Laceration Location:  Finger Finger laceration location:  L index finger Length (cm):  .5 Depth:  Cutaneous Quality: straight   Bleeding: uncontrolled   Time since incident:  2 hours Laceration mechanism:  Broken glass Pain details:    Quality:  Dull   Severity:  Mild   Timing:  Constant Relieved by:  Pressure Tetanus status:  Out of date   History reviewed. No pertinent past medical history.  History reviewed. No pertinent past surgical history.  No family history on file.  History  Substance Use Topics  . Smoking status: Never Smoker   . Smokeless tobacco: Not on file  . Alcohol Use: No      Review of Systems  Constitutional: Negative for fever and chills.  Skin: Positive for wound.  Neurological: Negative for numbness.  All other systems reviewed and are negative.    Allergies  Review of patient's allergies indicates no known allergies.  Home Medications   Current Outpatient Rx  Name  Route  Sig  Dispense  Refill  . ibuprofen (ADVIL,MOTRIN) 200 MG tablet   Oral   Take 400 mg by mouth every 6 (six) hours as needed for pain.         . cyclobenzaprine (FLEXERIL) 10 MG tablet   Oral   Take 1 tablet (10 mg total) by mouth 2 (two) times daily as needed for muscle spasms.   20 tablet   0   . ibuprofen (ADVIL,MOTRIN) 800 MG tablet   Oral   Take 1 tablet (800 mg total) by mouth 3 (three) times daily.   21 tablet   0   . traMADol (ULTRAM) 50 MG tablet   Oral   Take 1 tablet (50 mg total) by mouth  every 6 (six) hours as needed for pain.   15 tablet   0     BP 126/79  Pulse 80  Temp(Src) 98.7 F (37.1 C) (Oral)  Resp 18  SpO2 97%  Physical Exam  Nursing note and vitals reviewed. Constitutional: He appears well-developed and well-nourished.  Eyes: Pupils are equal, round, and reactive to light.  Neck: Normal range of motion.  Cardiovascular: Normal rate.   Pulmonary/Chest: Effort normal.  Musculoskeletal: Normal range of motion. He exhibits tenderness.       Hands: Neurological: He is alert.  Skin: Skin is warm.    ED Course  LACERATION REPAIR Date/Time: 01/21/2013 1:57 AM Performed by: Arman Filter Authorized by: Arman Filter Consent: Verbal consent obtained. Risks and benefits: risks, benefits and alternatives were discussed Consent given by: patient Patient understanding: patient states understanding of the procedure being performed Patient identity confirmed: verbally with patient Time out: Immediately prior to procedure a "time out" was called to verify the correct patient, procedure, equipment, support staff and site/side marked as required. Body area: upper extremity Location details: left index finger Laceration length: 0.5 cm Foreign bodies: glass Tendon involvement: none Nerve involvement: none Vascular damage: no Anesthesia: local infiltration Local anesthetic: lidocaine 1% without epinephrine Anesthetic total: 1 ml Preparation: Patient was  prepped and draped in the usual sterile fashion. Irrigation solution: saline Irrigation method: syringe Amount of cleaning: standard Debridement: none Degree of undermining: none Skin closure: 4-0 Prolene Number of sutures: 4 Technique: simple Approximation: close Approximation difficulty: simple Dressing: antibiotic ointment Patient tolerance: Patient tolerated the procedure well with no immediate complications.   (including critical care time)  Labs Reviewed - No data to display No results  found.   1. Laceration       MDM   Patient.  Sutured.  Uncomplicated closure is instructed to have the sutures removed in 10 days to wash daily with soap and water twice, and antibiotic ointment and cover with a Band-Aid        Arman Filter, NP 01/21/13 0159  Arman Filter, NP 01/27/13 2055

## 2013-01-25 NOTE — ED Provider Notes (Signed)
Medical screening examination/treatment/procedure(s) were performed by non-physician practitioner and as supervising physician I was immediately available for consultation/collaboration.   Benny Lennert, MD 01/25/13 [phone]

## 2013-01-28 ENCOUNTER — Emergency Department (HOSPITAL_COMMUNITY)
Admission: EM | Admit: 2013-01-28 | Discharge: 2013-01-28 | Disposition: A | Discharge status: Home / Self Care | Payer: BC Managed Care – PPO | Attending: Emergency Medicine | Admitting: Emergency Medicine

## 2013-01-28 ENCOUNTER — Encounter (HOSPITAL_COMMUNITY): Payer: Self-pay | Admitting: Emergency Medicine

## 2013-01-28 DIAGNOSIS — S39012D Strain of muscle, fascia and tendon of lower back, subsequent encounter: Secondary | ICD-10-CM

## 2013-01-28 DIAGNOSIS — Y9389 Activity, other specified: Secondary | ICD-10-CM | POA: Insufficient documentation

## 2013-01-28 DIAGNOSIS — K2970 Gastritis, unspecified, without bleeding: Secondary | ICD-10-CM | POA: Insufficient documentation

## 2013-01-28 DIAGNOSIS — R112 Nausea with vomiting, unspecified: Secondary | ICD-10-CM | POA: Insufficient documentation

## 2013-01-28 DIAGNOSIS — Y9289 Other specified places as the place of occurrence of the external cause: Secondary | ICD-10-CM | POA: Insufficient documentation

## 2013-01-28 DIAGNOSIS — R1013 Epigastric pain: Secondary | ICD-10-CM | POA: Insufficient documentation

## 2013-01-28 DIAGNOSIS — W19XXXA Unspecified fall, initial encounter: Secondary | ICD-10-CM | POA: Insufficient documentation

## 2013-01-28 DIAGNOSIS — Z79899 Other long term (current) drug therapy: Secondary | ICD-10-CM | POA: Insufficient documentation

## 2013-01-28 DIAGNOSIS — S335XXA Sprain of ligaments of lumbar spine, initial encounter: Secondary | ICD-10-CM | POA: Insufficient documentation

## 2013-01-28 DIAGNOSIS — IMO0001 Reserved for inherently not codable concepts without codable children: Secondary | ICD-10-CM | POA: Insufficient documentation

## 2013-01-28 DIAGNOSIS — Y990 Civilian activity done for income or pay: Secondary | ICD-10-CM | POA: Insufficient documentation

## 2013-01-28 MED ORDER — HYDROCODONE-ACETAMINOPHEN 5-325 MG PO TABS
1.0000 | ORAL_TABLET | Freq: Once | ORAL | Status: AC
  Administered 2013-01-28: 1 via ORAL
  Filled 2013-01-28: qty 1

## 2013-01-28 MED ORDER — PANTOPRAZOLE SODIUM 40 MG PO TBEC
40.0000 mg | DELAYED_RELEASE_TABLET | Freq: Every day | ORAL | Status: DC
  Administered 2013-01-28: 40 mg via ORAL
  Filled 2013-01-28: qty 1

## 2013-01-28 MED ORDER — LANSOPRAZOLE 30 MG PO CPDR: 30.0000 mg | DELAYED_RELEASE_CAPSULE | Freq: Every day | ORAL | Status: DC

## 2013-01-28 MED ORDER — ONDANSETRON 4 MG PO TBDP: ORAL_TABLET | ORAL | Status: DC

## 2013-01-28 MED ORDER — ONDANSETRON 4 MG PO TBDP
4.0000 mg | ORAL_TABLET | Freq: Once | ORAL | Status: AC
  Administered 2013-01-28: 4 mg via ORAL
  Filled 2013-01-28: qty 1

## 2013-01-28 MED ORDER — HYDROCODONE-ACETAMINOPHEN 5-325 MG PO TABS: 1.0000 | ORAL_TABLET | ORAL | Status: DC | PRN

## 2013-01-28 MED ORDER — GI COCKTAIL ~~LOC~~
30.0000 mL | Freq: Once | ORAL | Status: AC
  Administered 2013-01-28: 30 mL via ORAL
  Filled 2013-01-28: qty 30

## 2013-01-28 NOTE — ED Provider Notes (Signed)
History    CSN: [id number] Arrival date & time 01/28/13  7829  First MD Initiated Contact with Patient 01/28/13 0740     Chief Complaint  Patient presents with  . Back Pain  . Nausea   (Consider location/radiation/quality/duration/timing/severity/associated sxs/prior Treatment) HPI Pt recently treated for lumbar strain s/p fall at work. Given Rx for flexeril, Ibuprofen 800 mg and ultram. Pt states he has developed epigastric pain and nausea over the last few days and stopped taking all medication. No gross blood or coffee ground emesis. No fever, chills. Pt cot to have R lumbar and R shoulder pain. No weakness, numbness or urinary incontinence.  History reviewed. No pertinent past medical history. History reviewed. No pertinent past surgical history. No family history on file. History  Substance Use Topics  . Smoking status: Never Smoker   . Smokeless tobacco: Not on file  . Alcohol Use: No    Review of Systems  Constitutional: Negative for chills.  HENT: Negative for neck pain.   Respiratory: Negative for shortness of breath.   Cardiovascular: Negative for chest pain.  Gastrointestinal: Positive for nausea, vomiting and abdominal pain. Negative for diarrhea, constipation and blood in stool.  Genitourinary: Negative for difficulty urinating.  Musculoskeletal: Positive for myalgias and back pain.  Skin: Negative for rash and wound.  Neurological: Negative for dizziness, weakness, light-headedness, numbness and headaches.  All other systems reviewed and are negative.    Allergies  Review of patient's allergies indicates no known allergies.  Home Medications   Current Outpatient Rx  Name  Route  Sig  Dispense  Refill  . cyclobenzaprine (FLEXERIL) 10 MG tablet   Oral   Take 1 tablet (10 mg total) by mouth 2 (two) times daily as needed for muscle spasms.   20 tablet   0   . traMADol (ULTRAM) 50 MG tablet   Oral   Take 1 tablet (50 mg total) by mouth every 6 (six)  hours as needed for pain.   15 tablet   0   . HYDROcodone-acetaminophen (NORCO) 5-325 MG per tablet   Oral   Take 1 tablet by mouth every 4 (four) hours as needed for pain.   10 tablet   0   . lansoprazole (PREVACID) 30 MG capsule   Oral   Take 1 capsule (30 mg total) by mouth daily.   30 capsule   0   . ondansetron (ZOFRAN ODT) 4 MG disintegrating tablet      4mg  ODT q4 hours prn nausea/vomit   8 tablet   0    BP 154/90  Pulse 80  Temp(Src) 98 F (36.7 C) (Oral)  Resp 19  SpO2 94% Physical Exam  Nursing note and vitals reviewed. Constitutional: He is oriented to person, place, and time. He appears well-developed and well-nourished. No distress.  HENT:  Head: Normocephalic and atraumatic.  Mouth/Throat: Oropharynx is clear and moist.  Eyes: EOM are normal. Pupils are equal, round, and reactive to light.  Neck: Normal range of motion. Neck supple.  Cardiovascular: Normal rate and regular rhythm.   Pulmonary/Chest: Effort normal and breath sounds normal. No respiratory distress. He has no wheezes. He has no rales.  Abdominal: Soft. Bowel sounds are normal. He exhibits no distension and no mass. There is tenderness (epigastric tenderness to palpation. No rebound or guarding. ). There is no rebound and no guarding.  Musculoskeletal: Normal range of motion. He exhibits tenderness (TTP over R trapezius and R lumbar paraspinal muscles. No midline T/L spine  tenderness). He exhibits no edema.  Neurological: He is alert and oriented to person, place, and time.  5/5 motor in all ext, sensation intact  Skin: Skin is warm and dry. No rash noted. No erythema.  Psychiatric: He has a normal mood and affect. His behavior is normal.    ED Course  Procedures (including critical care time) Labs Reviewed - No data to display No results found. 1. NSAID induced gastritis, initial encounter   2. Lumbar strain, subsequent encounter     MDM  Advised to stop Ibuprofen. Can cont flexeril.  Will add norco, prevacid and zofran. Pt has developed NSAID gastritis. Return precautions given.   Loren Racer, MD 01/28/13 [id number]

## 2013-01-28 NOTE — ED Notes (Signed)
Pt states that he had an accident (states a stack of bread fell on him) at work and was seen for pain. Pt states that he has horrible lower back and is nauseated when he takes the meds he was given and hasnt ben able to sleep in the last several days due to the pain.

## 2013-02-01 ENCOUNTER — Inpatient Hospital Stay (HOSPITAL_COMMUNITY)
Admission: EM | Admit: 2013-02-01 | Discharge: 2013-02-07 | DRG: 578 | Disposition: A | Discharge status: Home / Self Care | Payer: BC Managed Care – PPO | Attending: Internal Medicine | Admitting: Internal Medicine

## 2013-02-01 ENCOUNTER — Encounter (HOSPITAL_COMMUNITY): Payer: Self-pay | Admitting: *Deleted

## 2013-02-01 ENCOUNTER — Emergency Department (HOSPITAL_COMMUNITY): Payer: BC Managed Care – PPO

## 2013-02-01 ENCOUNTER — Inpatient Hospital Stay (HOSPITAL_COMMUNITY): Payer: BC Managed Care – PPO

## 2013-02-01 DIAGNOSIS — T4275XA Adverse effect of unspecified antiepileptic and sedative-hypnotic drugs, initial encounter: Secondary | ICD-10-CM | POA: Diagnosis present

## 2013-02-01 DIAGNOSIS — K5900 Constipation, unspecified: Secondary | ICD-10-CM | POA: Diagnosis present

## 2013-02-01 DIAGNOSIS — R112 Nausea with vomiting, unspecified: Secondary | ICD-10-CM | POA: Diagnosis present

## 2013-02-01 DIAGNOSIS — M949 Disorder of cartilage, unspecified: Secondary | ICD-10-CM

## 2013-02-01 DIAGNOSIS — T380X5A Adverse effect of glucocorticoids and synthetic analogues, initial encounter: Secondary | ICD-10-CM | POA: Diagnosis not present

## 2013-02-01 DIAGNOSIS — E860 Dehydration: Secondary | ICD-10-CM | POA: Diagnosis present

## 2013-02-01 DIAGNOSIS — M898X9 Other specified disorders of bone, unspecified site: Secondary | ICD-10-CM

## 2013-02-01 DIAGNOSIS — M899 Disorder of bone, unspecified: Secondary | ICD-10-CM

## 2013-02-01 DIAGNOSIS — Z833 Family history of diabetes mellitus: Secondary | ICD-10-CM

## 2013-02-01 DIAGNOSIS — R7309 Other abnormal glucose: Secondary | ICD-10-CM | POA: Diagnosis not present

## 2013-02-01 DIAGNOSIS — M799 Soft tissue disorder, unspecified: Secondary | ICD-10-CM | POA: Diagnosis present

## 2013-02-01 DIAGNOSIS — N179 Acute kidney failure, unspecified: Secondary | ICD-10-CM | POA: Diagnosis present

## 2013-02-01 DIAGNOSIS — C9000 Multiple myeloma not having achieved remission: Principal | ICD-10-CM | POA: Diagnosis present

## 2013-02-01 LAB — CALCIUM
Calcium: >15 mg/dL (ref 8.4–10.5)
Calcium: >15 mg/dL (ref 8.4–10.5)

## 2013-02-01 LAB — CBC WITH DIFFERENTIAL/PLATELET
Basophils Absolute: 0 10*3/uL (ref 0.0–0.1)
Eosinophils Absolute: 0 10*3/uL (ref 0.0–0.7)
Eosinophils Relative: 1 % (ref 0–5)
Lymphs Abs: 1 10*3/uL (ref 0.7–4.0)
MCH: 31.1 pg (ref 26.0–34.0)
MCV: 90.4 fL (ref 78.0–100.0)
Monocytes Absolute: 0.4 10*3/uL (ref 0.1–1.0)
Platelets: 268 10*3/uL (ref 150–400)
RDW: 11.6 % (ref 11.5–15.5)

## 2013-02-01 LAB — URINALYSIS, ROUTINE W REFLEX MICROSCOPIC
Hgb urine dipstick: NEGATIVE
Leukocytes, UA: NEGATIVE
Nitrite: NEGATIVE
Protein, ur: NEGATIVE mg/dL
Specific Gravity, Urine: 1.014 (ref 1.005–1.030)
Urobilinogen, UA: 0.2 mg/dL (ref 0.0–1.0)

## 2013-02-01 LAB — HEMOGLOBIN A1C
Hgb A1c MFr Bld: 5.2 % (ref <5.7)
Mean Plasma Glucose: 103 mg/dL (ref <117)

## 2013-02-01 LAB — COMPREHENSIVE METABOLIC PANEL
ALT: 18 U/L (ref 0–53)
Calcium: >15 mg/dL (ref 8.4–10.5)
Creatinine, Ser: 1.68 mg/dL — ABNORMAL HIGH (ref 0.50–1.35)
GFR calc Af Amer: 57 mL/min — ABNORMAL LOW (ref >90)
Glucose, Bld: 166 mg/dL — ABNORMAL HIGH (ref 70–99)
Sodium: 136 mEq/L (ref 135–145)
Total Protein: 7.9 g/dL (ref 6.0–8.3)

## 2013-02-01 MED ORDER — ACETAMINOPHEN 325 MG PO TABS: 650.0000 mg | ORAL_TABLET | Freq: Four times a day (QID) | ORAL | Status: DC | PRN

## 2013-02-01 MED ORDER — SODIUM CHLORIDE 0.9 % IJ SOLN
3.0000 mL | Freq: Two times a day (BID) | INTRAMUSCULAR | Status: DC
  Administered 2013-02-02 – 2013-02-06 (×6): 3 mL via INTRAVENOUS

## 2013-02-01 MED ORDER — SODIUM CHLORIDE 0.9 % IV SOLN
INTRAVENOUS | Status: DC
  Administered 2013-02-01 – 2013-02-03 (×5): via INTRAVENOUS
  Administered 2013-02-05: 75 mL/h via INTRAVENOUS

## 2013-02-01 MED ORDER — DEXAMETHASONE SODIUM PHOSPHATE 10 MG/ML IJ SOLN
8.0000 mg | Freq: Three times a day (TID) | INTRAMUSCULAR | Status: DC
  Administered 2013-02-01 – 2013-02-07 (×18): 8 mg via INTRAVENOUS
  Filled 2013-02-01 (×21): qty 0.8

## 2013-02-01 MED ORDER — ONDANSETRON HCL 4 MG/2ML IJ SOLN
4.0000 mg | Freq: Once | INTRAMUSCULAR | Status: AC
  Administered 2013-02-01: 4 mg via INTRAVENOUS
  Filled 2013-02-01: qty 2

## 2013-02-01 MED ORDER — HEPARIN SODIUM (PORCINE) 5000 UNIT/ML IJ SOLN
5000.0000 [IU] | Freq: Three times a day (TID) | INTRAMUSCULAR | Status: DC
  Administered 2013-02-01 – 2013-02-02 (×3): 5000 [IU] via SUBCUTANEOUS
  Filled 2013-02-01 (×6): qty 1

## 2013-02-01 MED ORDER — ONDANSETRON HCL 4 MG PO TABS: 4.0000 mg | ORAL_TABLET | Freq: Four times a day (QID) | ORAL | Status: DC | PRN

## 2013-02-01 MED ORDER — CALCITONIN (SALMON) 200 UNIT/ML IJ SOLN
6.0000 [IU]/kg | Freq: Three times a day (TID) | INTRAMUSCULAR | Status: AC
  Administered 2013-02-01 – 2013-02-02 (×3): 526 [IU] via SUBCUTANEOUS
  Filled 2013-02-01 (×3): qty 2.63

## 2013-02-01 MED ORDER — POLYETHYLENE GLYCOL 3350 17 G PO PACK
17.0000 g | PACK | Freq: Every day | ORAL | Status: DC | PRN
  Filled 2013-02-01: qty 1

## 2013-02-01 MED ORDER — PANTOPRAZOLE SODIUM 40 MG PO TBEC
40.0000 mg | DELAYED_RELEASE_TABLET | Freq: Two times a day (BID) | ORAL | Status: DC
  Administered 2013-02-01 – 2013-02-06 (×12): 40 mg via ORAL
  Filled 2013-02-01 (×16): qty 1

## 2013-02-01 MED ORDER — FUROSEMIDE 10 MG/ML IJ SOLN
40.0000 mg | Freq: Once | INTRAMUSCULAR | Status: AC
  Administered 2013-02-01: 40 mg via INTRAVENOUS
  Filled 2013-02-01: qty 4

## 2013-02-01 MED ORDER — ZOLPIDEM TARTRATE 5 MG PO TABS
5.0000 mg | ORAL_TABLET | Freq: Every evening | ORAL | Status: DC | PRN
  Administered 2013-02-01 – 2013-02-03 (×3): 5 mg via ORAL
  Filled 2013-02-01 (×3): qty 1

## 2013-02-01 MED ORDER — MORPHINE SULFATE 2 MG/ML IJ SOLN
1.0000 mg | INTRAMUSCULAR | Status: DC | PRN
  Administered 2013-02-01 – 2013-02-05 (×2): 1 mg via INTRAVENOUS
  Filled 2013-02-01 (×2): qty 1

## 2013-02-01 MED ORDER — CALCITONIN (SALMON) 200 UNIT/ML IJ SOLN
4.0000 [IU]/kg | Freq: Two times a day (BID) | INTRAMUSCULAR | Status: DC
  Administered 2013-02-01: 352 [IU] via INTRAMUSCULAR
  Filled 2013-02-01 (×2): qty 1.76

## 2013-02-01 MED ORDER — ONDANSETRON HCL 4 MG/2ML IJ SOLN
4.0000 mg | Freq: Four times a day (QID) | INTRAMUSCULAR | Status: DC | PRN
  Administered 2013-02-02: 4 mg via INTRAVENOUS
  Filled 2013-02-01 (×2): qty 2

## 2013-02-01 MED ORDER — SODIUM CHLORIDE 0.9 % IV BOLUS (SEPSIS)
1000.0000 mL | Freq: Once | INTRAVENOUS | Status: AC
  Administered 2013-02-01: 1000 mL via INTRAVENOUS

## 2013-02-01 MED ORDER — ACETAMINOPHEN 650 MG RE SUPP: 650.0000 mg | Freq: Four times a day (QID) | RECTAL | Status: DC | PRN

## 2013-02-01 NOTE — Consult Note (Signed)
#   [id number] is consult note.  Mark [name] 1:5-7

## 2013-02-01 NOTE — ED Notes (Signed)
Transporting pt to floor on the monitor.  All belongings with pt and wife.  Consulting civil engineer notified.

## 2013-02-01 NOTE — Care Management Note (Signed)
Cm spoke with patient at bedside with family member present. No PCP on record. Patient provided information concerning [facility] to find providers within insurance network and information concerning [employer]. No other barriers identified at this time.   Roxy Manns Jensine Luz,RN,BSN [phone]

## 2013-02-01 NOTE — H&P (Signed)
[facility] History and Physical  Salman Wellen ZOX:[id number] DOB: Feb 02, 1971 DOA: 02/01/2013  Referring physician: Dr. Radford Pax PCP: No primary provider on file.    Chief Complaint: abd pain, nausea, vomiting  HPI: Mark [name] is a 42 y.o. male w/o past medical hx of importance; cqame to ED complaining of nausea, vomiting and abd pain. Patient reports symptoms has been present for the last 6-7 days and appears to be just worsening. Patient was seen on 6/30 with same symptoms and at that time was discharge home with empiric treatment for gastritis. Patient back after failing to improve his symptoms at home; this time blood work demonstrated ARF, hypercalcemia (>15) and abd x-ray/MRI of lumbar spine showed lytic lesions. [facility] called to admit patient for further evaluation and treatment. Patient denies CP, SOB, cough, HA's, dysuria, hematuria, hematochezia/melena or any other acute complaints.  In ED patient received 1L bolus of IVF and lasix 40mg  iv.   Review of Systems:  Positive for chills, constipation, lower back pain; otherwise negative except as mentioned on HPI.  History reviewed. No pertinent past medical history.  History reviewed. No pertinent past surgical history.  Social History:  reports that he has never smoked. He has never used smokeless tobacco. He reports that he does not drink alcohol or use illicit drugs.   No Known Allergies  Family History  Problem Relation Age of Onset  . Diabetic kidney disease Mother   . Hypertension Mother   . Heart attack Mother   . Kidney failure Mother   . Coronary artery disease Mother   . HIV Father     Prior to Admission medications   Medication Sig Start Date End Date Taking? Authorizing Provider  cyclobenzaprine (FLEXERIL) 10 MG tablet Take 1 tablet (10 mg total) by mouth 2 (two) times daily as needed for muscle spasms. 01/16/13  Yes Gilda Crease, MD  HYDROcodone-acetaminophen (NORCO) 5-325 MG per tablet Take 1  tablet by mouth every 4 (four) hours as needed for pain. 01/28/13  Yes Loren Racer, MD  lansoprazole (PREVACID) 30 MG capsule Take 1 capsule (30 mg total) by mouth daily. 01/28/13  Yes Loren Racer, MD  traMADol (ULTRAM) 50 MG tablet Take 1 tablet (50 mg total) by mouth every 6 (six) hours as needed for pain. 01/16/13  Yes Gilda Crease, MD   Physical Exam: Filed Vitals:   02/01/13 0333 02/01/13 1117 02/01/13 1213  BP: 155/97 152/90 145/90  Pulse: 87 86 88  Temp: 98.7 F (37.1 C) 99.1 F (37.3 C) 98.8 F (37.1 C)  TempSrc: Oral Oral Oral  Resp: 22 16 18   Height: 6' (1.829 m)    Weight: 87.771 kg (193 lb 8 oz)    SpO2: 94% 97% 100%     General:  NAD, afebrile, reports feeling nauseated and with increase thirst  Eyes: PERRL, EOMI, no icterus or nystagmus  ENT: mild dryness on MM, no erythema, exudates or thrush inside his mouth; no drainage out of ears or nostrils  Neck: supple, no bruits  Cardiovascular: S1 and S2, RRR, no rubs or gallops  Respiratory: CTA bilaterally  Abdomen: soft, slight tenderness diffusely (but pain is vague); no distension, no guarding, positive BS  Skin: no rash or petechiae  Musculoskeletal: no edema, no cyanosis or clubbing  Psychiatric: mood is appropriate  Neurologic: AAOX3, CN intact, no focal motor or sensory deficit appreciated on exam.  Labs on Admission:  Basic Metabolic Panel:  Recent Labs Lab 02/01/13 0415 02/01/13 0528 02/01/13 0934  NA  136  --   --   K 4.1  --   --   CL 97  --   --   CO2 31  --   --   GLUCOSE 166*  --   --   BUN 29*  --   --   CREATININE 1.68*  --   --   CALCIUM >15.0* >15.0* >15.0*   Liver Function Tests:  Recent Labs Lab 02/01/13 0415  AST 20  ALT 18  ALKPHOS 85  BILITOT 0.5  PROT 7.9  ALBUMIN 3.9    Recent Labs Lab 02/01/13 0415  LIPASE 23   CBC:  Recent Labs Lab 02/01/13 0415  WBC 6.0  NEUTROABS 4.5  HGB 13.0  HCT 37.8*  MCV 90.4  PLT 268   Radiological Exams  on Admission: Dg Chest 2 View  02/01/2013   *RADIOLOGY REPORT*  Clinical Data: Right lower back pain, constipation  CHEST - 2 VIEW  Comparison: 11/02/2012  Findings: Lungs are clear.  No pleural effusion or pneumothorax.  Cardiomediastinal silhouette is within normal limits.  Visualized osseous structures are within normal limits.  IMPRESSION: No evidence of acute cardiopulmonary disease.   Original Report Authenticated By: Charline Bills, M.D.   Mr Lumbar Spine Wo Contrast  02/01/2013   *RADIOLOGY REPORT*  Clinical Data: Low back pain.  Fall.  Lytic lesion in lumbar vertebral pedicle.  MRI LUMBAR SPINE WITHOUT CONTRAST  Technique:  Multiplanar and multiecho pulse sequences of the lumbar spine were obtained without intravenous contrast.  Comparison: 02/01/2013  Findings: The lytic mass involving the right pedicle, transverse process, and body of L5 measures 6.2 x 6.4 by 4.7 cm and has intermediate to low T1 signal characteristics and high T2 signal characteristics.  A similar signal characteristics are present in a 2.0 by 1.9 cm vertebral body lesion at the T12 level.  There are innumerable speckled T2 signal hyperintense lesions throughout the visualized lumbar spine and upper sacrum measuring between one and 8 mm also with similar imaging characteristics.  Visualized portions the kidneys appear unremarkable and no adenopathy in the visualized portion of the retroperitoneum noted.  No significant vertebral subluxation.  The pedicles in the lumbar spine are mildly congenitally narrow.  I do not see a definite intradural lesion, although IV contrast was not administered.  Additional findings at individual levels are as follows:  L1-2:  No impingement.  L2-3:  Borderline subarticular lateral recess stenosis bilaterally due to mild facet arthropathy.  L3-4:  Mild left and borderline right subarticular lateral recess stenosis due to minimal disc bulge and facet arthropathy.  L4-5:  Poor definition of the right L4  nerve separate from the mass arising from the right L5 vertebra in the lateral extraforaminal space.  The right L5 nerve roots abut the tumor from the expanded right L5 pedicle in the lateral extraforaminal space.  Disc bulge noted.  L5-S1:  The right L5 nerve appears partially encased by the right- sided tumor.  IMPRESSION:  1.  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  Dominant 6.4 cm expansile mass of the right L5 pedicle, transverse process, and body.  This mass at least partially encases the right L5 spinal nerve and abuts the right L4 nerve in the lateral extraforaminal space. 2.  Mild left subarticular lateral recess stenosis at L3-4 due to disc bulge and facet arthropathy.   Original Report Authenticated By: Gaylyn Rong, M.D.   Dg Abd 2  Views  02/01/2013   **ADDENDUM** CREATED: 02/01/2013 11:42:07  Additionally noted is a lytic lesion in the left iliac bone and a healing right lateral 7th rib fracture (likely pathologic).  **END ADDENDUM** SIGNED BY: Charline Bills, M.D.  02/01/2013   *RADIOLOGY REPORT*  Clinical Data: Abdominal pain, constipation  ABDOMEN - 2 VIEW  Comparison: CT abdomen pelvis dated 05/24/2010  Findings: Nonobstructive bowel gas pattern.  Moderate stool in the right colon.  No evidence of free air under the diaphragm on the upright view.  Lytic lesion involving the right pedicle at L5.  IMPRESSION: No evidence of small bowel obstruction or free air.  Moderate stool in the right colon.  Lytic lesion involving the right pedicle at L5.  MRI lumbar spine with/without contrast is suggested for further evaluation.  These results were called by telephone on 02/01/2013 at 2035 hours to Dr. Radford Pax, who verbally acknowledged these results.   Original Report Authenticated By: Charline Bills, M.D.    EKG:  Rate: 66  Rhythm: normal sinus rhythm and sinus arrhythmia  QRS Axis: normal   Intervals: QT shortened  ST/T Wave abnormalities: nonspecific T wave changes  Conduction Disutrbances:none  Narrative Interpretation: early repolarization noted  Old EKG Reviewed: changes noted  Assessment/Plan 1-Hypercalcemia: patient with elevated calcium, renal failure and lytic lesions on x-ray/MRI. High concerns for MM -will check phosphorus, PTH, ionized calcium, SPEP/UPEP and TSH in order to help with Diff diagnosis. -Will provide aggressive fluid resuscitation and use calcitonin -Hem/Onc consulted.  2-Lytic lesion of bone on x-ray: MRI has confirmed lytic lesions and has demonstrated multiple others affecting his spine. 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  3-Nausea with vomiting: will use PRN antiemetics  4-ARF (acute renal failure): due to MM and dehydration/NSAID's use. -will provide IVF's -UA w/o signs of infection -avoid nephrotoxic agents (especially NSAID's) -will follow Cr trend  5-Hyperglycemia: with hx of first degree relative with DM; no prior hx of DM. -will check A1C -Fasting glucose in am  6-Constipation: most likely due to recent use of narcotics and hypercalcemia. Will start miralax.  WUJ:WJXB use heparin.   Hem/Onc (Dr. Myna Hidalgo)  Code Status:Full Family Communication: wife at bedside Disposition Plan: Telemetry, inpatient, LOS > 2 midnights  Time spent: 55 minutes  Mark [name] [facility] Pager [phone]  If 7PM-7AM, please contact night-coverage [email or web address] Password [facility] 02/01/2013, 12:46 PM

## 2013-02-01 NOTE — ED Notes (Signed)
Patient transported to X-ray 

## 2013-02-01 NOTE — ED Notes (Signed)
PA at bedside; PA requested RN to perform EKG at a later time due important discussion of x-ray results.

## 2013-02-01 NOTE — ED Notes (Signed)
Pt states he hasn't gotten any better since Monday, that he has abdominal pain and nausea ,  Pt was diagnosed with gastritis and placed on medication that he says he has taken without relief,  Pt is alert and oriented

## 2013-02-01 NOTE — ED Provider Notes (Signed)
History    CSN: [id number] Arrival date & time 02/01/13  0259  First MD Initiated Contact with Patient 02/01/13 [phone]     Chief Complaint  Patient presents with  . Abdominal Pain  . Suture / Staple Removal   (Consider location/radiation/quality/duration/timing/severity/associated sxs/prior Treatment) The history is provided by the patient. No language interpreter was used.  Mark [name] is a 42 y/o M presenting to the ED with abdominal pain and nausea that has been ongoing since he was last seen in the ED on 01/28/2013 - patient describes that abdominal pain to be "knotted up" sensation that is constant with radiation to the to the right side of the back and right upper thigh - described the thigh to be of a stiffening sensation, stated that the patient is worse when he gets up and moves around, better when he is laying still. Stated that he has been taking the Vicodin, Flexeril, and Lansoprazole that he was prescribed on 01/28/2013 when he was diagnosed with gastritis. Patient reported that he has been running a low-grade fever for the past 2 days, 99.5-100 degrees fahrenheit. Reported that he has been feeling cold the past couple of days with shakes. Stated that he had to use an enema last night in order to have a BM, has not had a BM in the past couple of days. Reported that he has been feeling light-headed. Denied diarrhea, dysuria, vomiting, hematochezia, melena, chest pain, shortness of breath, difficulty breathing, headache, numbness, tingling, hematuria, urinary symptoms. Denied family history of cancer. Denied abdominal surgery. PCP none    History reviewed. No pertinent past medical history. History reviewed. No pertinent past surgical history. Family History  Problem Relation Age of Onset  . Diabetic kidney disease Mother   . Hypertension Mother   . Heart attack Mother   . Kidney failure Mother   . Coronary artery disease Mother   . HIV Father    History  Substance Use Topics   . Smoking status: Never Smoker   . Smokeless tobacco: Never Used  . Alcohol Use: No    Review of Systems  Constitutional: Positive for fever. Negative for chills.  HENT: Negative for sore throat, trouble swallowing, neck pain and neck stiffness.   Eyes: Negative for visual disturbance.  Respiratory: Negative for chest tightness and shortness of breath.   Cardiovascular: Negative for chest pain.  Gastrointestinal: Positive for nausea, abdominal pain and constipation. Negative for vomiting, diarrhea, blood in stool and anal bleeding.  Genitourinary: Negative for dysuria, hematuria, decreased urine volume and difficulty urinating.  Musculoskeletal: Positive for myalgias and back pain.  Neurological: Positive for light-headedness. Negative for dizziness, weakness and numbness.  All other systems reviewed and are negative.    Allergies  Review of patient's allergies indicates no known allergies.  Home Medications   Current Outpatient Rx  Name  Route  Sig  Dispense  Refill  . cyclobenzaprine (FLEXERIL) 10 MG tablet   Oral   Take 1 tablet (10 mg total) by mouth 2 (two) times daily as needed for muscle spasms.   20 tablet   0   . HYDROcodone-acetaminophen (NORCO) 5-325 MG per tablet   Oral   Take 1 tablet by mouth every 4 (four) hours as needed for pain.   10 tablet   0   . lansoprazole (PREVACID) 30 MG capsule   Oral   Take 1 capsule (30 mg total) by mouth daily.   30 capsule   0   . traMADol (ULTRAM) 50  MG tablet   Oral   Take 1 tablet (50 mg total) by mouth every 6 (six) hours as needed for pain.   15 tablet   0    BP 155/97  Pulse 87  Temp(Src) 98.7 F (37.1 C) (Oral)  Resp 22  Ht 6' (1.829 m)  Wt 193 lb 8 oz (87.771 kg)  BMI 26.24 kg/m2  SpO2 94% Physical Exam  Nursing note and vitals reviewed. Constitutional: He is oriented to person, place, and time. He appears well-developed and well-nourished. No distress.  HENT:  Head: Normocephalic and atraumatic.   Mouth/Throat: Oropharynx is clear and moist. No oropharyngeal exudate.  Eyes: Conjunctivae and EOM are normal. Pupils are equal, round, and reactive to light. Right eye exhibits no discharge. Left eye exhibits no discharge.  Neck: Normal range of motion. Neck supple.  Negative neck stiffness Negative nuchal rigidity  Cardiovascular: Normal rate, regular rhythm and normal heart sounds.  Exam reveals no friction rub.   No murmur heard. Pulses:      Radial pulses are 2+ on the right side, and 2+ on the left side.       Dorsalis pedis pulses are 2+ on the right side, and 2+ on the left side.  Pulmonary/Chest: Effort normal and breath sounds normal. No respiratory distress. He has no wheezes. He has no rales.  Abdominal: Soft. Normal appearance and bowel sounds are normal. He exhibits no distension. There is tenderness in the right lower quadrant. There is guarding and positive Murphy's sign. There is no rigidity and no rebound.  Musculoskeletal: Normal range of motion.  Strength 5+/5+ with resistance  Lymphadenopathy:    He has no cervical adenopathy.  Neurological: He is alert and oriented to person, place, and time. No cranial nerve deficit. He exhibits normal muscle tone. Coordination normal. GCS eye subscore is 4. GCS verbal subscore is 5. GCS motor subscore is 6.  Cranial nerves III-XII grossly intact  Skin: Skin is warm and dry. No rash noted. He is not diaphoretic. No erythema.  4 single interrupted sutures placed to the distal pad of the left index finger - negative inflammation, swelling, drainage - negative sign of infection   Psychiatric: He has a normal mood and affect. His behavior is normal. Thought content normal.    ED Course  Procedures (including critical care time)  9:40AM Spoke with Dr. Lestine Box - admitting the patient to the hospital.    Date: 02/01/2013  Rate: 66  Rhythm: normal sinus rhythm and sinus arrhythmia  QRS Axis: normal  Intervals: QT shortened  ST/T  Wave abnormalities: nonspecific T wave changes  Conduction Disutrbances:none  Narrative Interpretation: early repolarization noted  Old EKG Reviewed: changes noted   Labs Reviewed  CBC WITH DIFFERENTIAL - Abnormal; Notable for the following:    RBC 4.18 (*)    HCT 37.8 (*)    All other components within normal limits  COMPREHENSIVE METABOLIC PANEL - Abnormal; Notable for the following:    Glucose, Bld 166 (*)    BUN 29 (*)    Creatinine, Ser 1.68 (*)    Calcium >15.0 (*)    GFR calc non Af Amer 49 (*)    GFR calc Af Amer 57 (*)    All other components within normal limits  URINALYSIS, ROUTINE W REFLEX MICROSCOPIC - Abnormal; Notable for the following:    APPearance CLOUDY (*)    All other components within normal limits  CALCIUM - Abnormal; Notable for the following:  Calcium >15.0 (*)    All other components within normal limits  LIPASE, BLOOD  CALCIUM   Dg Chest 2 View  02/01/2013   *RADIOLOGY REPORT*  Clinical Data: Right lower back pain, constipation  CHEST - 2 VIEW  Comparison: 11/02/2012  Findings: Lungs are clear.  No pleural effusion or pneumothorax.  Cardiomediastinal silhouette is within normal limits.  Visualized osseous structures are within normal limits.  IMPRESSION: No evidence of acute cardiopulmonary disease.   Original Report Authenticated By: Charline Bills, M.D.   Dg Abd 2 Views  02/01/2013   *RADIOLOGY REPORT*  Clinical Data: Abdominal pain, constipation  ABDOMEN - 2 VIEW  Comparison: CT abdomen pelvis dated 05/24/2010  Findings: Nonobstructive bowel gas pattern.  Moderate stool in the right colon.  No evidence of free air under the diaphragm on the upright view.  Lytic lesion involving the right pedicle at L5.  IMPRESSION: No evidence of small bowel obstruction or free air.  Moderate stool in the right colon.  Lytic lesion involving the right pedicle at L5.  MRI lumbar spine with/without contrast is suggested for further evaluation.  These results were called  by telephone on 02/01/2013 at 2035 hours to Dr. Radford Pax, who verbally acknowledged these results.   Original Report Authenticated By: Charline Bills, M.D.   1. Hypercalcemia   2. Lytic lesion of bone on x-ray     MDM  Patient presenting to the ED with abdominal pain that has been ongoing for the past week - was seen in the ED on 01/28/2013, no labs drawn, was diagnosed with NSAID induced gastritis and given Vicodin, flexeril, and lansoprazole to take.  Sutures removed, 4, from distal pad of left index finger. Negative sign of infection. Negative dehiscence.  Negative acute abdomen, negative peritoneal signs. EKG noted shortened QT intervals which coincide with hypercalcemia. UA negative findings - negative signs of infection. CBC negative elevation of WBC count. Lipase negative elevations. CMP - BUN increased from 16 one year ago to 29, and Cr increased from 1.00 one year ago to 1.68 - renal insufficiency noted. Hypercalcemia with levels > 15.0, level repeated at 5:28AM calcium > 15.0. Abdominal series ordered - lytic lesion noted to the L5 region - imaging of lumbar spine from 01/16/2013 negative findings of lytic lesion noted. Radiologist called Dr. Lourena Simmonds and recommended MR Lumbar Spine to be ordered. Discussed case with Dr. Lourena Simmonds. Spoke with Dr. Lestine Box - patient to be admitted to the hospital for hypercalcemia, as inpatient to Telemetry, Team 4. Discussed lab findings with patient and concern with hypercalcemia and new lytic lesion noted to lumbar spine on xray that patient may possibly have cancer, discussed with patient that cancer is the highest differential at this moment - patient understood. Discussed with patient that he is to be admitted to the hospital to control calcium levels. Discussed with patient that once discharged from hospital further testing and work-up will be required to identify etiology - discussed dangers and stressed importance of following up - patient understood.    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    Raymon Mutton, PA-C 02/01/13 1649  Raymon Mutton, PA-C 02/01/13 2041

## 2013-02-01 NOTE — Care Management Note (Signed)
CARE MANAGEMENT NOTE 02/01/2013  Patient:  [facility]   Account Number:  [id number]  Date Initiated:  02/01/2013  Documentation initiated by:  Joron Velis  Subjective/Objective Assessment:   42 yo male admitted with ARf & hypercalcemia. No PCP on record.     Action/Plan:   Home when stable   Anticipated DC Date:     Anticipated DC Plan:  HOME/SELF CARE      DC Planning Services  CM consult  PCP issues      Choice offered to / List presented to:  NA   DME arranged  NA      DME agency  NA     HH arranged  NA      HH agency  NA   Status of service:  In process, will continue to follow Medicare Important Message given?   (If response is "NO", the following Medicare IM given date fields will be blank) Date Medicare IM given:   Date Additional Medicare IM given:    Discharge Disposition:    Per UR Regulation:  Reviewed for med. necessity/level of care/duration of stay  If discussed at Long Length of Stay Meetings, dates discussed:    Comments:  02/01/13 1351 Lydiann Bonifas,RN,BSN [phone] chart reviewed. CM to consult pt for need of PCP establishment prior to discharge. No other needs identified.

## 2013-02-01 NOTE — ED Notes (Signed)
Pt still in MRI.  Notified PA of Calcium level and that pt is still in MRI.

## 2013-02-01 NOTE — ED Notes (Signed)
Bed:WA11<BR> Expected date:<BR> Expected time:<BR> Means of arrival:<BR> Comments:<BR>

## 2013-02-01 NOTE — ED Notes (Signed)
Pt states was here Monday w/ abdominal pain, diagnosed w/ gastritis, sent home w/ medication, medication has not helped, still having nausea and abdominal pain.

## 2013-02-01 NOTE — ED Notes (Signed)
Patient transported to MRI 

## 2013-02-01 NOTE — Consult Note (Signed)
NAMENICHOLLAS, PERUSSE                 ACCOUNT NO.:  [id number]  MEDICAL RECORD NO.:  [id number]  LOCATION:  1406                         FACILITY:  [facility]  PHYSICIAN:  [name], M.D.  DATE OF BIRTH:  1971/07/08  DATE OF CONSULTATION: DATE OF DISCHARGE:                                CONSULTATION   REASON FOR CONSULTATION: 1. Hypercalcium. 2. Lytic lesion at L5. 3. Renal insufficiency.  HISTORY OF PRESENT ILLNESS:  Mr. Villella is a really nice 42 year old African American gentleman.  He really has no past medical history.  He has been in [place] for 16 years.  He is originally from [place].  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  He was thought to have a "gastritis", I think a week ago.  This did not get better.  He subsequently came to the emergency room.  While in the emergency room, he had a lab work done.  This was this morning.  His calcium was over 15.  BUN was 29.  Creatinine 1.68.  His total protein was 7.9 with albumin of 3.9.  Potassium was 4.1.  He had a CBC done which showed a white count of 6, hemoglobin 13, hematocrit 38, platelet count 268.  He did have a chest x-ray done.  This was unremarkable and the bones looked okay.  His abdominal x-ray showed a lytic lesion at the right pedicle at L5.  He says that he actually had fallen at work.  Given some medications to try to help with the discomfort and he thought this was causing his problems.  He has not noticed any obvious weight loss.  Appetite has been okay.  He has had no cough or shortness of breath.  He has had no fever, sweats, or chills.  He has had some right thigh pain.  He has had no leg swelling.  He has had no rashes.  He has been no change in bowel or bladder habits. We were called to help with the evaluation given that he had a lytic lesion and hypercalcemia.  PAST MEDICAL HISTORY:  Pretty much unremarkable.  He has no obvious risk factors for HIV or  hepatitis.  ALLERGIES:  None.  ADMISSION MEDICATIONS:  Flexeril 10 mg p.o. b.i.d. p.r.n., Vicodin 1 p.o. q.4 hours p.r.n., Prevacid 30 mg p.o. daily, and Ultram 50 mg p.o. q.6 hours p.r.n.  SOCIAL HISTORY:  Negative for any tobacco use.  There is really no alcohol use.  There is no obvious occupational exposures.  Again, he has never had a blood transfusion.  He is married and has a child.  He said he was checked for a hepatitis and HIV a year ago.  FAMILY HISTORY:  Remarkable for his father with HIV.  There is history of diabetes and coronary artery disease, and hypertension in the family.  REVIEW OF SYSTEMS:  As stated in history of present illness.  No additional findings noted on a 12-system review.  PHYSICAL EXAMINATION:  General:  This is a well-developed, well- nourished African American gentleman in no obvious distress.  Vital signs show a temperature of 98, pulse is 80, respiratory rate 18, blood  pressure 145/90.  Head and neck:  Normocephalic, atraumatic skull. There are no ocular or oral lesions.  There are no palpable, cervical, or supraclavicular lymph nodes.  Lungs:  Clear bilaterally.  Cardiac: Regular rate and rhythm with normal S1, S2.  There are no murmurs, rubs, or bruits.  Abdomen:  Soft with good bowel sounds.  There is no palpable abdominal mass.  There is no palpable hepatosplenomegaly.  Back exam does show some tenderness in the right sacroiliac region.  No masses are noted.  Extremities shows no clubbing, cyanosis, or edema. Neurological:  Shows no focal neurological deficits.  LABORATORY STUDIES:  Sodium of 136, potassium 4.1, BUN 29, creatinine 1.68.  Glucose 166.  LFTs are normal. MRI was done.  This shock shows a 6.2 x 6.4 x 4.7 cm lytic mass involving the right pedicle and transverse process, and body of L5. There is also a 2 x 1.9 cm lesion at T12.  Innumerable lesions are noted throughout the lumbosacral spine, the right L5 nerve root, but  the tumor.  IMPRESSION:  Mr. Antolin  is a very nice 42 year old African American gentleman, who presents with hypercalcemia.  He had a large lytic mass at the right L5 vertebral body.  I just cannot imagine what this might be.  He is awfully young for plasmacytoma.  I suspect that lymphoma or Hodgkin disease might be a possibility.  In a young guy, testicular cancer is always a possibility.  Again, he has not noted any issues with his testicles.   I would send off a tumor markers for testicular cancer.  Again, testicular cancer is not known to cause hypercalcemia.  Primary bone tumors are not associated with hypercalcemia.  Metastasis from renal cell carcinoma is a possibility.  We certainly have a large mass that we could biopsy.  Hopefully, this to be done via Interventional Radiology.  Mr. Sawaya is a real nice guy.  I talked to him about all this.  I am sure nothing will be done until next week, depending on the Radiology schedule.  He will definitely need Radiation Oncology to see him once we establish the diagnosis of malignancy.  I would get CT scans on him but for now I would hold off all these until we see his renal function improving.  We may want to get an ultrasound of his kidneys, just to make sure there is nothing going on with his kidneys with respect to renal cell carcinoma. We will follow Mr. Nichol along closely.  I definitely agree with the calcitonin.  I will give him higher dose however.  I would give 3 doses of this.  I would also give him Zometa to help get his calcium down.     [name], M.D.     PRE/MEDQ  D:  02/01/2013  T:  02/01/2013  Job:  [id number]

## 2013-02-02 ENCOUNTER — Encounter (HOSPITAL_COMMUNITY): Payer: Self-pay | Admitting: Radiology

## 2013-02-02 LAB — RENAL FUNCTION PANEL
Albumin: 3.9 g/dL (ref 3.5–5.2)
Chloride: 97 mEq/L (ref 96–112)
GFR calc non Af Amer: 57 mL/min — ABNORMAL LOW (ref >90)
Potassium: 4.8 mEq/L (ref 3.5–5.1)

## 2013-02-02 LAB — APTT: aPTT: 28 seconds (ref 24–37)

## 2013-02-02 LAB — LACTATE DEHYDROGENASE: LDH: 247 U/L (ref 94–250)

## 2013-02-02 MED ORDER — FLEET ENEMA 7-19 GM/118ML RE ENEM
1.0000 | ENEMA | Freq: Every day | RECTAL | Status: DC | PRN
  Administered 2013-02-02: 1 via RECTAL
  Administered 2013-02-03: 20:00:00 via RECTAL
  Administered 2013-02-05: 1 via RECTAL
  Filled 2013-02-02 (×3): qty 1

## 2013-02-02 MED ORDER — SODIUM CHLORIDE 0.9 % IV SOLN: 90.0000 mg | Freq: Once | INTRAVENOUS | Status: DC

## 2013-02-02 MED ORDER — HEPARIN SODIUM (PORCINE) 5000 UNIT/ML IJ SOLN
5000.0000 [IU] | Freq: Three times a day (TID) | INTRAMUSCULAR | Status: DC
  Administered 2013-02-02 – 2013-02-07 (×13): 5000 [IU] via SUBCUTANEOUS
  Filled 2013-02-02 (×18): qty 1

## 2013-02-02 NOTE — Progress Notes (Signed)
[facility] PROGRESS NOTE  Mark [name] WUJ:[id number] DOB: Jan 06, 1971 DOA: 02/01/2013 PCP: No primary provider on file.  Assessment/Plan: 1-Hypercalcemia: patient with elevated calcium, renal failure and lytic lesions on abd  x-ray/MRI. High concerns for MM  -calcium today 14.6 (2 more doses of calcitonin pending) -phosphorus and TSH WNL, PTH and SPEP/UPEP pending  -Will continue IVF's -continue decadron  -if calcium remains elevated despite calcitonin tx will use zometa -Hem/Onc has been consulted and will follow rec's  2-Lytic lesion of bone on x-ray: MRI has confirmed lytic lesions and has demonstrated multiple others affecting his spine.  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  -biopsy plan for 7/7  3-Nausea with vomiting: will continue PRN antiemetics   4-ARF (acute renal failure): due to presumably MM and dehydration/NSAID's use.  -will continue IVF's -Cr 1.5 today  -UA w/o signs of infection  -renal US unremarkable  -avoid nephrotoxic agents (especially NSAID's)  -will follow Cr trend   5-Hyperglycemia: with hx of first degree relative with DM; no prior hx of DM.  -A1C 5.2 -will monitor as he is on decadron; but not indication for hypoglycemic therapy at this point.  6-Constipation: most likely due to recent use of narcotics and hypercalcemia. Will continue miralax.   DVT: will use heparin.   Code Status: Full Family Communication: no family at bedside Disposition Plan: home when medically stable   Consultants:  Hem/Onc  Procedures:  Lytic mass biopsy schedule for 7/7  See below for x-ray reports  Antibiotics:    HPI/Subjective: Afebrile; feeling better. Denies any vomiting. Intermittent nausea and abd discomfort. Patient is hungry and will like to have his diet advance.  Objective: Filed Vitals:   02/01/13 1213 02/01/13 2158 02/02/13 0513 02/02/13 1300  BP: 145/90 141/97 142/98 129/89  Pulse: 88 85 81 87  Temp: 98.8 F  (37.1 C) 98.4 F (36.9 C) 98.3 F (36.8 C) 98.5 F (36.9 C)  TempSrc: Oral Oral Oral Oral  Resp: 18 18 18 17   Height:      Weight:      SpO2: 100% 94% 94% 95%    Intake/Output Summary (Last 24 hours) at 02/02/13 1446 Last data filed at 02/02/13 0700  Gross per 24 hour  Intake 2191.67 ml  Output    800 ml  Net 1391.67 ml   Filed Weights   02/01/13 0333  Weight: 87.771 kg (193 lb 8 oz)    Exam:   General:  Afebrile, NAD  Cardiovascular: S1 and S2, no rubs or gallops  Respiratory: CTA bilaterally  Abdomen: no distended, positive BS; patient reports some intermittent discomfort but there is no tenderness or guarding on exam  Musculoskeletal: no edema, no cyanosis  Data Reviewed: Basic Metabolic Panel:  Recent Labs Lab 02/01/13 0415 02/01/13 0528 02/01/13 0934 02/01/13 1230 02/02/13 0515  NA 136  --   --   --  135  K 4.1  --   --   --  4.8  CL 97  --   --   --  97  CO2 31  --   --   --  25  GLUCOSE 166*  --   --   --  137*  BUN 29*  --   --   --  30*  CREATININE 1.68*  --   --   --  1.50*  CALCIUM >15.0* >15.0* >15.0*  --  14.5*  PHOS  --   --   --  4.4 4.2   Liver  Function Tests:  Recent Labs Lab 02/01/13 0415 02/02/13 0515  AST 20  --   ALT 18  --   ALKPHOS 85  --   BILITOT 0.5  --   PROT 7.9  --   ALBUMIN 3.9 3.9    Recent Labs Lab 02/01/13 0415  LIPASE 23   CBC:  Recent Labs Lab 02/01/13 0415  WBC 6.0  NEUTROABS 4.5  HGB 13.0  HCT 37.8*  MCV 90.4  PLT 268     Studies: Dg Chest 2 View  02/01/2013   *RADIOLOGY REPORT*  Clinical Data: Right lower back pain, constipation  CHEST - 2 VIEW  Comparison: 11/02/2012  Findings: Lungs are clear.  No pleural effusion or pneumothorax.  Cardiomediastinal silhouette is within normal limits.  Visualized osseous structures are within normal limits.  IMPRESSION: No evidence of acute cardiopulmonary disease.   Original Report Authenticated By: Charline Bills, M.D.   Mr Lumbar Spine Wo  Contrast  02/01/2013   *RADIOLOGY REPORT*  Clinical Data: Low back pain.  Fall.  Lytic lesion in lumbar vertebral pedicle.  MRI LUMBAR SPINE WITHOUT CONTRAST  Technique:  Multiplanar and multiecho pulse sequences of the lumbar spine were obtained without intravenous contrast.  Comparison: 02/01/2013  Findings: The lytic mass involving the right pedicle, transverse process, and body of L5 measures 6.2 x 6.4 by 4.7 cm and has intermediate to low T1 signal characteristics and high T2 signal characteristics.  A similar signal characteristics are present in a 2.0 by 1.9 cm vertebral body lesion at the T12 level.  There are innumerable speckled T2 signal hyperintense lesions throughout the visualized lumbar spine and upper sacrum measuring between one and 8 mm also with similar imaging characteristics.  Visualized portions the kidneys appear unremarkable and no adenopathy in the visualized portion of the retroperitoneum noted.  No significant vertebral subluxation.  The pedicles in the lumbar spine are mildly congenitally narrow.  I do not see a definite intradural lesion, although IV contrast was not administered.  Additional findings at individual levels are as follows:  L1-2:  No impingement.  L2-3:  Borderline subarticular lateral recess stenosis bilaterally due to mild facet arthropathy.  L3-4:  Mild left and borderline right subarticular lateral recess stenosis due to minimal disc bulge and facet arthropathy.  L4-5:  Poor definition of the right L4 nerve separate from the mass arising from the right L5 vertebra in the lateral extraforaminal space.  The right L5 nerve roots abut the tumor from the expanded right L5 pedicle in the lateral extraforaminal space.  Disc bulge noted.  L5-S1:  The right L5 nerve appears partially encased by the right- sided tumor.  IMPRESSION:  1.  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   Dominant 6.4 cm expansile mass of the right L5 pedicle, transverse process, and body.  This mass at least partially encases the right L5 spinal nerve and abuts the right L4 nerve in the lateral extraforaminal space. 2.  Mild left subarticular lateral recess stenosis at L3-4 due to disc bulge and facet arthropathy.   Original Report Authenticated By: Gaylyn Rong, M.D.   US Renal  02/02/2013   *RADIOLOGY REPORT*  Clinical Data: Hypercalcemia, question renal lesion, unable to have CT with contrast due to renal dysfunction  RENAL/URINARY TRACT ULTRASOUND COMPLETE  Comparison:  CT abdomen and pelvis 05/24/2010  Findings:  Right Kidney:  13.1 cm length.  Normal cortical thickness and echogenicity.  No  mass, hydronephrosis or shadowing calcification. No perinephric fluid.  Left Kidney:  12.2 cm length.  Normal cortical thickness and echogenicity.  Small extrarenal pelvis.  No mass, hydronephrosis or shadowing calcification.  No perinephric fluid.  Bladder:  Normal appearance with a calculated prevoid volume of 160 ml.  IMPRESSION: Unremarkable renal ultrasound.   Original Report Authenticated By: Ulyses Southward, M.D.   Dg Abd 2 Views  02/01/2013   **ADDENDUM** CREATED: 02/01/2013 11:42:07  Additionally noted is a lytic lesion in the left iliac bone and a healing right lateral 7th rib fracture (likely pathologic).  **END ADDENDUM** SIGNED BY: Charline Bills, M.D.  02/01/2013   *RADIOLOGY REPORT*  Clinical Data: Abdominal pain, constipation  ABDOMEN - 2 VIEW  Comparison: CT abdomen pelvis dated 05/24/2010  Findings: Nonobstructive bowel gas pattern.  Moderate stool in the right colon.  No evidence of free air under the diaphragm on the upright view.  Lytic lesion involving the right pedicle at L5.  IMPRESSION: No evidence of small bowel obstruction or free air.  Moderate stool in the right colon.  Lytic lesion involving the right pedicle at L5.  MRI lumbar spine with/without contrast is suggested for further  evaluation.  These results were called by telephone on 02/01/2013 at 2035 hours to Dr. Radford Pax, who verbally acknowledged these results.   Original Report Authenticated By: Charline Bills, M.D.    Scheduled Meds: . calcitonin  6 Units/kg Subcutaneous TID  . dexamethasone  8 mg Intravenous Q8H  . heparin  5,000 Units Subcutaneous Q8H  . pantoprazole  40 mg Oral BID  . sodium chloride  3 mL Intravenous Q12H   Continuous Infusions: . sodium chloride 75 mL/hr at 02/02/13 1150    Active Problems:   Hypercalcemia   Lytic lesion of bone on x-ray   Nausea with vomiting   ARF (acute renal failure)    Time spent: >30 minutes   Aspyn Warnke  [facility] Pager [phone]. If 7PM-7AM, please contact night-coverage at [email or web address], password [facility] An 02/02/2013, 2:46 PM  LOS: 1 day

## 2013-02-02 NOTE — Progress Notes (Signed)
CRITICAL VALUE ALERT  Critical value received:  Ionized calcium 2.34  Date of notification:  02/01/2013  Time of notification:  2350  Critical value read back:yes  Nurse who received alert:  L. Maryjo Rochester  MD notified (1st page):  Lenny Pastel NP  Time of first page:  [phone]  MD notified (2nd page):  Time of second page:  Responding MD:  Lenny Pastel NP  Time MD responded:  [phone]

## 2013-02-02 NOTE — H&P (Signed)
Mark [name] is an 42 y.o. male.   Chief Complaint: abdominal pain; back pain x few weeks Work up reveals bony spinal lesions; L5 paraspinal mass hypercalcemia Scheduled now for L 5 mass biopsy 7/7 HPI: None  History reviewed. No pertinent past medical history.  History reviewed. No pertinent past surgical history.  Family History  Problem Relation Age of Onset  . Diabetic kidney disease Mother   . Hypertension Mother   . Heart attack Mother   . Kidney failure Mother   . Coronary artery disease Mother   . HIV Father    Social History:  reports that he has never smoked. He has never used smokeless tobacco. He reports that he does not drink alcohol or use illicit drugs.  Allergies: No Known Allergies  Medications Prior to Admission  Medication Sig Dispense Refill  . cyclobenzaprine (FLEXERIL) 10 MG tablet Take 1 tablet (10 mg total) by mouth 2 (two) times daily as needed for muscle spasms.  20 tablet  0  . HYDROcodone-acetaminophen (NORCO) 5-325 MG per tablet Take 1 tablet by mouth every 4 (four) hours as needed for pain.  10 tablet  0  . lansoprazole (PREVACID) 30 MG capsule Take 1 capsule (30 mg total) by mouth daily.  30 capsule  0  . traMADol (ULTRAM) 50 MG tablet Take 1 tablet (50 mg total) by mouth every 6 (six) hours as needed for pain.  15 tablet  0    Results for orders placed during the hospital encounter of 02/01/13 (from the past 48 hour(s))  CBC WITH DIFFERENTIAL     Status: Abnormal   Collection Time    02/01/13  4:15 AM      Result Value Range   WBC 6.0  4.0 - 10.5 K/uL   RBC 4.18 (*) 4.22 - 5.81 MIL/uL   Hemoglobin 13.0  13.0 - 17.0 g/dL   HCT 16.1 (*) 09.6 - 04.5 %   MCV 90.4  78.0 - 100.0 fL   MCH 31.1  26.0 - 34.0 pg   MCHC 34.4  30.0 - 36.0 g/dL   RDW 40.9  81.1 - 91.4 %   Platelets 268  150 - 400 K/uL   Neutrophils Relative % 76  43 - 77 %   Neutro Abs 4.5  1.7 - 7.7 K/uL   Lymphocytes Relative 17  12 - 46 %   Lymphs Abs 1.0  0.7 - 4.0 K/uL    Monocytes Relative 7  3 - 12 %   Monocytes Absolute 0.4  0.1 - 1.0 K/uL   Eosinophils Relative 1  0 - 5 %   Eosinophils Absolute 0.0  0.0 - 0.7 K/uL   Basophils Relative 0  0 - 1 %   Basophils Absolute 0.0  0.0 - 0.1 K/uL  COMPREHENSIVE METABOLIC PANEL     Status: Abnormal   Collection Time    02/01/13  4:15 AM      Result Value Range   Sodium 136  135 - 145 mEq/L   Potassium 4.1  3.5 - 5.1 mEq/L   Chloride 97  96 - 112 mEq/L   CO2 31  19 - 32 mEq/L   Glucose, Bld 166 (*) 70 - 99 mg/dL   BUN 29 (*) 6 - 23 mg/dL   Creatinine, Ser 7.82 (*) 0.50 - 1.35 mg/dL   Calcium >95.6 (*) 8.4 - 10.5 mg/dL   Comment: CRITICAL RESULT CALLED TO, READ BACK BY AND VERIFIED WITH:     TNEILSEN RN AT  0510 ON [id number] BY DLONG   Total Protein 7.9  6.0 - 8.3 g/dL   Albumin 3.9  3.5 - 5.2 g/dL   AST 20  0 - 37 U/L   ALT 18  0 - 53 U/L   Alkaline Phosphatase 85  39 - 117 U/L   Total Bilirubin 0.5  0.3 - 1.2 mg/dL   GFR calc non Af Amer 49 (*) >90 mL/min   GFR calc Af Amer 57 (*) >90 mL/min   Comment:            The eGFR has been calculated     using the CKD EPI equation.     This calculation has not been     validated in all clinical     situations.     eGFR's persistently     <90 mL/min signify     possible Chronic Kidney Disease.  LIPASE, BLOOD     Status: None   Collection Time    02/01/13  4:15 AM      Result Value Range   Lipase 23  11 - 59 U/L  URINALYSIS, ROUTINE W REFLEX MICROSCOPIC     Status: Abnormal   Collection Time    02/01/13  4:16 AM      Result Value Range   Color, Urine YELLOW  YELLOW   APPearance CLOUDY (*) CLEAR   Specific Gravity, Urine 1.014  1.005 - 1.030   pH 6.5  5.0 - 8.0   Glucose, UA NEGATIVE  NEGATIVE mg/dL   Hgb urine dipstick NEGATIVE  NEGATIVE   Bilirubin Urine NEGATIVE  NEGATIVE   Ketones, ur NEGATIVE  NEGATIVE mg/dL   Protein, ur NEGATIVE  NEGATIVE mg/dL   Urobilinogen, UA 0.2  0.0 - 1.0 mg/dL   Nitrite NEGATIVE  NEGATIVE   Leukocytes, UA NEGATIVE   NEGATIVE   Comment: MICROSCOPIC NOT DONE ON URINES WITH NEGATIVE PROTEIN, BLOOD, LEUKOCYTES, NITRITE, OR GLUCOSE <1000 mg/dL.  CALCIUM     Status: Abnormal   Collection Time    02/01/13  5:28 AM      Result Value Range   Calcium >15.0 (*) 8.4 - 10.5 mg/dL   Comment: CONSISTENT WITH PREVIOUS RESULT     CRITICAL RESULT CALLED TO, READ BACK BY AND VERIFIED WITH:     TNEILSEN RN AT 0600 ON [id number] BY DLONG  CALCIUM     Status: Abnormal   Collection Time    02/01/13  9:34 AM      Result Value Range   Calcium >15.0 (*) 8.4 - 10.5 mg/dL   Comment: CRITICAL RESULT CALLED TO, READ BACK BY AND VERIFIED WITH:     BINGHAM,S. RN AT 1043 02/01/13 BARFIELD,T  CALCIUM, IONIZED     Status: Abnormal   Collection Time    02/01/13 12:30 PM      Result Value Range   Calcium, Ion 2.34 (*) 1.12 - 1.23 mmol/L   Comment: CRITICAL RESULT CALLED TO, READ BACK BY AND VERIFIED WITH:     LVERGELDEDIOS RN AT 2345 ON [id number] BY DLONG     (NOTE)     Result repeated and verified.     Amended report.  PHOSPHORUS     Status: None   Collection Time    02/01/13 12:30 PM      Result Value Range   Phosphorus 4.4  2.3 - 4.6 mg/dL  TSH     Status: None   Collection Time    02/01/13 12:30 PM  Result Value Range   TSH 3.673  0.350 - 4.500 uIU/mL  HEMOGLOBIN A1C     Status: None   Collection Time    02/01/13 12:30 PM      Result Value Range   Hemoglobin A1C 5.2  <5.7 %   Comment: (NOTE)                                                                               According to the ADA Clinical Practice Recommendations for 2011, when     HbA1c is used as a screening test:      >=6.5%   Diagnostic of Diabetes Mellitus               (if abnormal result is confirmed)     5.7-6.4%   Increased risk of developing Diabetes Mellitus     References:Diagnosis and Classification of Diabetes Mellitus,Diabetes     Care,2011,34(Suppl 1):S62-S69 and Standards of Medical Care in             Diabetes - 2011,Diabetes  Care,2011,34 (Suppl 1):S11-S61.   Mean Plasma Glucose 103  <117 mg/dL  RENAL FUNCTION PANEL     Status: Abnormal   Collection Time    02/02/13  5:15 AM      Result Value Range   Sodium 135  135 - 145 mEq/L   Potassium 4.8  3.5 - 5.1 mEq/L   Chloride 97  96 - 112 mEq/L   CO2 25  19 - 32 mEq/L   Glucose, Bld 137 (*) 70 - 99 mg/dL   BUN 30 (*) 6 - 23 mg/dL   Creatinine, Ser 1.61 (*) 0.50 - 1.35 mg/dL   Calcium 09.6 (*) 8.4 - 10.5 mg/dL   Comment: CRITICAL RESULT CALLED TO, READ BACK BY AND VERIFIED WITH:     VERGEL DEL DIOS, L RN @0617  ON 02/02/2013 BY MCREYNOLDS,B   Phosphorus 4.2  2.3 - 4.6 mg/dL   Albumin 3.9  3.5 - 5.2 g/dL   GFR calc non Af Amer 57 (*) >90 mL/min   GFR calc Af Amer 66 (*) >90 mL/min   Comment:            The eGFR has been calculated     using the CKD EPI equation.     This calculation has not been     validated in all clinical     situations.     eGFR's persistently     <90 mL/min signify     possible Chronic Kidney Disease.  LACTATE DEHYDROGENASE     Status: None   Collection Time    02/02/13  5:15 AM      Result Value Range   LDH 247  94 - 250 U/L  HCG, SERUM, QUALITATIVE     Status: None   Collection Time    02/02/13  5:15 AM      Result Value Range   Preg, Serum NEGATIVE  NEGATIVE   Comment:            THE SENSITIVITY OF THIS     METHODOLOGY IS >10 mIU/mL.  PROTIME-INR     Status: None   Collection Time    02/02/13  9:47 AM      Result Value Range   Prothrombin Time 13.0  11.6 - 15.2 seconds   INR 1.00  0.00 - 1.49  APTT     Status: None   Collection Time    02/02/13  9:47 AM      Result Value Range   aPTT 28  24 - 37 seconds   Dg Chest 2 View  02/01/2013   *RADIOLOGY REPORT*  Clinical Data: Right lower back pain, constipation  CHEST - 2 VIEW  Comparison: 11/02/2012  Findings: Lungs are clear.  No pleural effusion or pneumothorax.  Cardiomediastinal silhouette is within normal limits.  Visualized osseous structures are within normal limits.   IMPRESSION: No evidence of acute cardiopulmonary disease.   Original Report Authenticated By: Charline Bills, M.D.   Mr Lumbar Spine Wo Contrast  02/01/2013   *RADIOLOGY REPORT*  Clinical Data: Low back pain.  Fall.  Lytic lesion in lumbar vertebral pedicle.  MRI LUMBAR SPINE WITHOUT CONTRAST  Technique:  Multiplanar and multiecho pulse sequences of the lumbar spine were obtained without intravenous contrast.  Comparison: 02/01/2013  Findings: The lytic mass involving the right pedicle, transverse process, and body of L5 measures 6.2 x 6.4 by 4.7 cm and has intermediate to low T1 signal characteristics and high T2 signal characteristics.  A similar signal characteristics are present in a 2.0 by 1.9 cm vertebral body lesion at the T12 level.  There are innumerable speckled T2 signal hyperintense lesions throughout the visualized lumbar spine and upper sacrum measuring between one and 8 mm also with similar imaging characteristics.  Visualized portions the kidneys appear unremarkable and no adenopathy in the visualized portion of the retroperitoneum noted.  No significant vertebral subluxation.  The pedicles in the lumbar spine are mildly congenitally narrow.  I do not see a definite intradural lesion, although IV contrast was not administered.  Additional findings at individual levels are as follows:  L1-2:  No impingement.  L2-3:  Borderline subarticular lateral recess stenosis bilaterally due to mild facet arthropathy.  L3-4:  Mild left and borderline right subarticular lateral recess stenosis due to minimal disc bulge and facet arthropathy.  L4-5:  Poor definition of the right L4 nerve separate from the mass arising from the right L5 vertebra in the lateral extraforaminal space.  The right L5 nerve roots abut the tumor from the expanded right L5 pedicle in the lateral extraforaminal space.  Disc bulge noted.  L5-S1:  The right L5 nerve appears partially encased by the right- sided tumor.  IMPRESSION:  1.   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  Dominant 6.4 cm expansile mass of the right L5 pedicle, transverse process, and body.  This mass at least partially encases the right L5 spinal nerve and abuts the right L4 nerve in the lateral extraforaminal space. 2.  Mild left subarticular lateral recess stenosis at L3-4 due to disc bulge and facet arthropathy.   Original Report Authenticated By: Gaylyn Rong, M.D.   US Renal  02/02/2013   *RADIOLOGY REPORT*  Clinical Data: Hypercalcemia, question renal lesion, unable to have CT with contrast due to renal dysfunction  RENAL/URINARY TRACT ULTRASOUND COMPLETE  Comparison:  CT abdomen and pelvis 05/24/2010  Findings:  Right Kidney:  13.1 cm length.  Normal cortical thickness and echogenicity.  No mass, hydronephrosis or shadowing calcification. No perinephric fluid.  Left Kidney:  12.2 cm length.  Normal cortical thickness and echogenicity.  Small extrarenal pelvis.  No mass, hydronephrosis or shadowing calcification.  No perinephric fluid.  Bladder:  Normal appearance with a calculated prevoid volume of 160 ml.  IMPRESSION: Unremarkable renal ultrasound.   Original Report Authenticated By: Ulyses Southward, M.D.   Dg Abd 2 Views  02/01/2013   **ADDENDUM** CREATED: 02/01/2013 11:42:07  Additionally noted is a lytic lesion in the left iliac bone and a healing right lateral 7th rib fracture (likely pathologic).  **END ADDENDUM** SIGNED BY: Charline Bills, M.D.  02/01/2013   *RADIOLOGY REPORT*  Clinical Data: Abdominal pain, constipation  ABDOMEN - 2 VIEW  Comparison: CT abdomen pelvis dated 05/24/2010  Findings: Nonobstructive bowel gas pattern.  Moderate stool in the right colon.  No evidence of free air under the diaphragm on the upright view.  Lytic lesion involving the right pedicle at L5.  IMPRESSION: No evidence of small bowel obstruction or free air.  Moderate stool in  the right colon.  Lytic lesion involving the right pedicle at L5.  MRI lumbar spine with/without contrast is suggested for further evaluation.  These results were called by telephone on 02/01/2013 at 2035 hours to Dr. Radford Pax, who verbally acknowledged these results.   Original Report Authenticated By: Charline Bills, M.D.    Review of Systems  Constitutional: Negative for fever.  Respiratory: Negative for shortness of breath.   Cardiovascular: Negative for chest pain.  Gastrointestinal: Positive for abdominal pain. Negative for nausea and vomiting.  Musculoskeletal: Positive for back pain.  Neurological: Positive for weakness.    Blood pressure 142/98, pulse 81, temperature 98.3 F (36.8 C), temperature source Oral, resp. rate 18, height 6' (1.829 m), weight 193 lb 8 oz (87.771 kg), SpO2 94.00%. Physical Exam  Constitutional: He is oriented to person, place, and time.  Cardiovascular: Normal rate, regular rhythm and normal heart sounds.   No murmur heard. Respiratory: Breath sounds normal. He has no wheezes.  GI: Soft. Bowel sounds are normal. There is no tenderness.  Musculoskeletal: Normal range of motion.  Neurological: He is alert and oriented to person, place, and time.  Psychiatric: He has a normal mood and affect. His behavior is normal. Judgment and thought content normal.     Assessment/Plan Diffuse spinal bony lesions; L5 mass prob MM Scheduled for mass biopsy 7/7 Pt aware of procedure benefits and risks and agreeable to proceed Consent signed and in chart  [place] Luppino A 02/02/2013, 11:03 AM

## 2013-02-02 NOTE — Progress Notes (Signed)
CRITICAL VALUE ALERT  Critical value received:  Calcium 14.5  Date of notification:  02/02/2013  Time of notification:  0530  Critical value read back:yes  Nurse who received alert:  L. Maryjo Rochester  MD notified (1st page):  Lenny Pastel NP  Time of first page:  [phone]  MD notified (2nd page):  Time of second page:  Responding MD:  Lenny Pastel NP  Time MD responded:  [phone]

## 2013-02-03 LAB — BASIC METABOLIC PANEL
CO2: 27 mEq/L (ref 19–32)
Chloride: 102 mEq/L (ref 96–112)
GFR calc non Af Amer: 48 mL/min — ABNORMAL LOW (ref >90)
Glucose, Bld: 136 mg/dL — ABNORMAL HIGH (ref 70–99)
Potassium: 4.8 mEq/L (ref 3.5–5.1)
Sodium: 138 mEq/L (ref 135–145)

## 2013-02-03 LAB — AFP TUMOR MARKER: AFP-Tumor Marker: 5.8 ng/mL (ref 0.0–8.0)

## 2013-02-03 MED ORDER — SODIUM CHLORIDE 0.9 % IV SOLN
90.0000 mg | Freq: Once | INTRAVENOUS | Status: AC
  Administered 2013-02-03: 90 mg via INTRAVENOUS
  Filled 2013-02-03: qty 10

## 2013-02-03 NOTE — Progress Notes (Addendum)
[facility] PROGRESS NOTE  Mark [name] ION:[id number] DOB: 04-Jun-1971 DOA: 02/01/2013 PCP: No primary provider on file.  Assessment/Plan: 1-Hypercalcemia: patient with elevated calcium, renal failure and lytic lesions on abd  x-ray/MRI. High concerns for MM  -calcium today 14.9 (received 4 doses of calcitonin) -phosphorus and TSH WNL, PTH and SPEP/UPEP pending  -Will continue IVF's -continue decadron  -calcium 14.9; will give dose of zometa -Hem/Onc has been consulted and will follow rec's  2-Lytic lesion of bone on x-ray: MRI has confirmed lytic lesions and has demonstrated multiple others affecting his spine.  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  -biopsy plan for 7/7  3-Nausea with vomiting: will continue PRN antiemetics and will change diet to CLD again  4-ARF (acute renal failure): due to presumably MM and dehydration/NSAID's use.  -will continue IVF's -Cr 1.7 today  -UA w/o signs of infection  -renal US unremarkable  -avoid nephrotoxic agents (especially NSAID's)  -will follow Cr trend   5-Hyperglycemia: with hx of first degree relative with DM; no prior hx of DM.  -A1C 5.2 -will monitor as he is on decadron; but not indication for hypoglycemic therapy at this point.  6-Constipation: most likely due to recent use of narcotics and hypercalcemia. Will continue miralax and PRN enemas as requested by patient.  DVT: will use heparin.   Code Status: Full Family Communication: no family at bedside Disposition Plan: home when medically stable   Consultants:  Hem/Onc  Procedures:  Lytic mass biopsy schedule for 7/7  See below for x-ray reports  Antibiotics:    HPI/Subjective: Afebrile. Reports increase N/V overnight while trying to advance his diet. No CP or SOB  Objective: Filed Vitals:   02/02/13 0513 02/02/13 1300 02/02/13 2046 02/03/13 0601  BP: 142/98 129/89 146/93 144/99  Pulse: 81 87 75 70  Temp: 98.3 F (36.8 C) 98.5 F  (36.9 C) 98.7 F (37.1 C) 98.4 F (36.9 C)  TempSrc: Oral Oral Oral Oral  Resp: 18 17 18 16   Height:      Weight:      SpO2: 94% 95% 95% 97%    Intake/Output Summary (Last 24 hours) at 02/03/13 1509 Last data filed at 02/03/13 1008  Gross per 24 hour  Intake 2845.83 ml  Output    650 ml  Net 2195.83 ml   Filed Weights   02/01/13 0333  Weight: 87.771 kg (193 lb 8 oz)    Exam:   General:  Afebrile, NAD  Cardiovascular: S1 and S2, no rubs or gallops  Respiratory: CTA bilaterally  Abdomen: no distended, positive BS; patient reports some intermittent discomfort but there is no tenderness or guarding on exam  Musculoskeletal: no edema, no cyanosis  Data Reviewed: Basic Metabolic Panel:  Recent Labs Lab 02/01/13 0415 02/01/13 0528 02/01/13 0934 02/01/13 1230 02/02/13 0515 02/03/13 0514  NA 136  --   --   --  135 138  K 4.1  --   --   --  4.8 4.8  CL 97  --   --   --  97 102  CO2 31  --   --   --  25 27  GLUCOSE 166*  --   --   --  137* 136*  BUN 29*  --   --   --  30* 36*  CREATININE 1.68*  --   --   --  1.50* 1.72*  CALCIUM >15.0* >15.0* >15.0*  --  14.5* 14.9*  PHOS  --   --   --  4.4 4.2  --    Liver Function Tests:  Recent Labs Lab 02/01/13 0415 02/02/13 0515  AST 20  --   ALT 18  --   ALKPHOS 85  --   BILITOT 0.5  --   PROT 7.9  --   ALBUMIN 3.9 3.9    Recent Labs Lab 02/01/13 0415  LIPASE 23   CBC:  Recent Labs Lab 02/01/13 0415  WBC 6.0  NEUTROABS 4.5  HGB 13.0  HCT 37.8*  MCV 90.4  PLT 268     Studies: US Renal  02/02/2013   *RADIOLOGY REPORT*  Clinical Data: Hypercalcemia, question renal lesion, unable to have CT with contrast due to renal dysfunction  RENAL/URINARY TRACT ULTRASOUND COMPLETE  Comparison:  CT abdomen and pelvis 05/24/2010  Findings:  Right Kidney:  13.1 cm length.  Normal cortical thickness and echogenicity.  No mass, hydronephrosis or shadowing calcification. No perinephric fluid.  Left Kidney:  12.2 cm  length.  Normal cortical thickness and echogenicity.  Small extrarenal pelvis.  No mass, hydronephrosis or shadowing calcification.  No perinephric fluid.  Bladder:  Normal appearance with a calculated prevoid volume of 160 ml.  IMPRESSION: Unremarkable renal ultrasound.   Original Report Authenticated By: Ulyses Southward, M.D.    Scheduled Meds: . dexamethasone  8 mg Intravenous Q8H  . heparin  5,000 Units Subcutaneous Q8H  . pantoprazole  40 mg Oral BID  . sodium chloride  3 mL Intravenous Q12H   Continuous Infusions: . sodium chloride 100 mL/hr at 02/03/13 1115    Active Problems:   Hypercalcemia   Lytic lesion of bone on x-ray   Nausea with vomiting   ARF (acute renal failure)    Time spent: >30 minutes   Mark [name]  [facility] Pager [phone]. If 7PM-7AM, please contact night-coverage at [email or web address], password [facility] 02/03/2013, 3:09 PM  LOS: 2 days

## 2013-02-03 NOTE — Progress Notes (Signed)
CRITICAL VALUE ALERT  Critical value received:  Calcium 14.9  Date of notification:  02/03/2013  Time of notification:  0625  Critical value read back:yes  Nurse who received alert:  L. Vergel de Lucy Chris RN  MD notified (1st page):  Lenny Pastel NP  Time of first page:  0630  MD notified (2nd page):  Time of second page:  Responding MD:  Lenny Pastel NP  Time MD responded:  [phone]

## 2013-02-04 ENCOUNTER — Ambulatory Visit
Admit: 2013-02-04 | Discharge: 2013-02-04 | Disposition: A | Discharge status: Home / Self Care | Payer: BC Managed Care – PPO | Attending: Radiation Oncology | Admitting: Radiation Oncology

## 2013-02-04 ENCOUNTER — Inpatient Hospital Stay (HOSPITAL_COMMUNITY): Payer: BC Managed Care – PPO

## 2013-02-04 ENCOUNTER — Encounter: Payer: Self-pay | Admitting: Radiation Oncology

## 2013-02-04 DIAGNOSIS — M545 Low back pain, unspecified: Secondary | ICD-10-CM | POA: Insufficient documentation

## 2013-02-04 DIAGNOSIS — Z510 Encounter for antineoplastic radiation therapy: Secondary | ICD-10-CM | POA: Insufficient documentation

## 2013-02-04 DIAGNOSIS — C9000 Multiple myeloma not having achieved remission: Secondary | ICD-10-CM | POA: Insufficient documentation

## 2013-02-04 DIAGNOSIS — M899 Disorder of bone, unspecified: Secondary | ICD-10-CM

## 2013-02-04 LAB — RENAL FUNCTION PANEL
Albumin: 3.6 g/dL (ref 3.5–5.2)
BUN: 33 mg/dL — ABNORMAL HIGH (ref 6–23)
Chloride: 103 mEq/L (ref 96–112)
Glucose, Bld: 146 mg/dL — ABNORMAL HIGH (ref 70–99)
Potassium: 4.7 mEq/L (ref 3.5–5.1)

## 2013-02-04 LAB — CBC
MCV: 89.1 fL (ref 78.0–100.0)
Platelets: 286 10*3/uL (ref 150–400)
RBC: 3.96 MIL/uL — ABNORMAL LOW (ref 4.22–5.81)
WBC: 11.9 10*3/uL — ABNORMAL HIGH (ref 4.0–10.5)

## 2013-02-04 MED ORDER — FENTANYL CITRATE 0.05 MG/ML IJ SOLN
INTRAMUSCULAR | Status: AC | PRN
  Administered 2013-02-04: 100 ug via INTRAVENOUS

## 2013-02-04 MED ORDER — MIDAZOLAM HCL 2 MG/2ML IJ SOLN
INTRAMUSCULAR | Status: AC | PRN
  Administered 2013-02-04 (×2): 1 mg via INTRAVENOUS

## 2013-02-04 NOTE — Progress Notes (Signed)
CRITICAL VALUE ALERT  Critical value received:  Calcium 13.4  Date of notification:  02/04/2013  Time of notification:  0545  Critical value read back:yes  Nurse who received alert:  L. Vergel de Lucy Chris RN  MD notified (1st page):  Lenny Pastel NP  Time of first page:  [phone]  MD notified (2nd page):  Time of second page:  Responding MD:  Lenny Pastel NP  Time MD responded:  [phone]

## 2013-02-04 NOTE — Procedures (Signed)
Procedure:  CT guided core biopsy of right paraspinous mass Findings:  6 cm right paraspinous mass at L5 destroying vertebral body.  18 G core biopsy x 4 via 17 G needle.

## 2013-02-04 NOTE — Progress Notes (Signed)
Radiation Oncology         [phone] ________________________________  Initial In-patient Consultation  Name: Broderick Fonseca MRN: [id number]  Date: 02/04/2013  DOB: March 02, 1971  CC: Arlan Organ, MD  REFERRING PHYSICIAN: Arlan Organ, MD  DIAGNOSIS: Probable multiple myeloma  HISTORY OF PRESENT ILLNESS::Mark [name] is a 42 y.o. male who is seen out of the courtesy of Dr. Arlan Organ for an opinion concerning radiation therapy for what appears to be multiple myeloma.  The patient presented with a several week history of low back pain. He presented to the emergency room and workup revealed hypercalcemia, acute renal failure and lytic lesions along the lumbar spine area. Patient was subsequently admitted to the hospital.  An MRI the lumbar spine showed a soft tissue mass along the right side of L5 with destruction of the L5 vertebral body.  There also noted to be lytic areas throughout the lumbar spine suspicious for metastasis.  Earlier today the patient underwent biopsy of the soft tissue mass along the lower lumbar spine with results pending at this time.  radiation therapy is been consulted for consideration for radiation therapy directed at the lower lumbar spine area.     PREVIOUS RADIATION THERAPY: No  PAST MEDICAL HISTORY:  has no past medical history on file.    PAST SURGICAL HISTORY:History reviewed. No pertinent past surgical history.  FAMILY HISTORY: family history includes Coronary artery disease in his mother; Diabetic kidney disease in his mother; HIV in his father; Heart attack in his mother; Hypertension in his mother; and Kidney failure in his mother.  SOCIAL HISTORY:  reports that he has never smoked. He has never used smokeless tobacco. He reports that he does not drink alcohol or use illicit drugs. 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  ALLERGIES: Review of patient's allergies indicates no known  allergies.  MEDICATIONS:  No current facility-administered medications for this encounter.   No current outpatient prescriptions on file.   Facility-Administered Medications Ordered in Other Encounters  Medication Dose Route Frequency Provider Last Rate Last Dose  . 0.9 %  sodium chloride infusion   Intravenous Continuous Vassie Loll, MD 75 mL/hr at 02/04/13 0909 75 mL/hr at 02/04/13 0909  . acetaminophen (TYLENOL) tablet 650 mg  650 mg Oral Q6H PRN Vassie Loll, MD       Or  . acetaminophen (TYLENOL) suppository 650 mg  650 mg Rectal Q6H PRN Vassie Loll, MD      . dexamethasone (DECADRON) injection 8 mg  8 mg Intravenous Q8H [name], MD   8 mg at 02/04/13 1701  . heparin injection 5,000 Units  5,000 Units Subcutaneous Q8H Robet Leu, PA-C   5,000 Units at 02/03/13 2215  . morphine 2 MG/ML injection 1 mg  1 mg Intravenous Q3H PRN Vassie Loll, MD   1 mg at 02/01/13 1414  . ondansetron (ZOFRAN) tablet 4 mg  4 mg Oral Q6H PRN Vassie Loll, MD       Or  . ondansetron [facility]) injection 4 mg  4 mg Intravenous Q6H PRN Vassie Loll, MD   4 mg at 02/02/13 1925  . pantoprazole (PROTONIX) EC tablet 40 mg  40 mg Oral BID [name], MD   40 mg at 02/04/13 1009  . polyethylene glycol (MIRALAX / GLYCOLAX) packet 17 g  17 g Oral Daily PRN Vassie Loll, MD      . sodium chloride 0.9 % injection 3 mL  3 mL Intravenous Q12H Vassie Loll, MD   3 mL at 02/03/13 2215  . sodium phosphate (FLEET) 7-19 GM/118ML enema 1 enema  1 enema Rectal Daily PRN Vassie Loll, MD      . zolpidem [facility]) tablet 5 mg  5 mg Oral QHS PRN Rolan Lipa, NP   5 mg at 02/03/13 2214    REVIEW OF SYSTEMS:  A 15 point review of systems is documented in the electronic medical record. This was obtained by the nursing staff. However, I reviewed this with the patient to discuss relevant findings and make appropriate changes.  Patient has significant low back pain. He is also noticed some numbness in  the upper portion of his right leg. While at work prior to his admission the patient did notice his right leg to give out.  He has had problems with constipation recently. He denies any problems with urinary retention or urinary incontinence.   PHYSICAL EXAM: This is a very pleasant healthy-appearing 42 year old gentleman in no acute distress. He is sitting up in his hospital bed and accompanied by his wife son and daughter this evening.  General Appearance:    Alert, cooperative, no distress, appears stated age  Head:    Normocephalic, without obvious abnormality, atraumatic  Eyes:    PERRL, conjunctiva/corneas clear, EOM's intact        Nose:   Nares normal, septum midline, mucosa normal, no drainage    or sinus tenderness  Throat:   Lips, mucosa, and tongue normal; teeth and gums normal  Neck:   Supple, symmetrical, trachea midline, no adenopathy;       thyroid:  No enlargement/tenderness/nodules    Back:     Symmetric, no curvature, ROM normal, no CVA tenderness  Lungs:     Clear to auscultation bilaterally, respirations unlabored  Chest wall:    No tenderness or deformity  Heart:    Regular rate and rhythm, S1 and S2 normal, no murmur, rub   or gallop,  Monitor leads in place   Abdomen:     Soft, non-tender, bowel sounds active all four quadrants,    no masses, no organomegaly        Extremities:   Extremities normal, atraumatic, no cyanosis or edema  Pulses:   2+ and symmetric all extremities  Skin:   Skin color, texture, turgor normal, no rashes or lesions  Lymph nodes:   Cervical, supraclavicular, and axillary nodes normal  Neurologic:   CNII-XII intact. Normal strength, sensation and reflexes      throughout    LABORATORY DATA:  Lab Results  Component Value Date   WBC 11.9* 02/04/2013   HGB 12.7* 02/04/2013   HCT 35.3* 02/04/2013   MCV 89.1 02/04/2013   PLT 286 02/04/2013   Lab Results  Component Value Date   NA 135 02/04/2013   K 4.7 02/04/2013   CL 103 02/04/2013   CO2 26  02/04/2013   Lab Results  Component Value Date   ALT 18 02/01/2013   AST 20 02/01/2013   ALKPHOS 85 02/01/2013   BILITOT 0.5 02/01/2013     RADIOGRAPHY: Dg Chest 2 View  02/01/2013   *RADIOLOGY REPORT*  Clinical Data: Right lower back pain, constipation  CHEST - 2 VIEW  Comparison: 11/02/2012  Findings: Lungs are clear.  No pleural effusion or pneumothorax.  Cardiomediastinal silhouette is within normal limits.  Visualized osseous structures are within normal limits.  IMPRESSION: No evidence of acute cardiopulmonary disease.   Original Report Authenticated  By: Charline Bills, M.D.   Dg Lumbar Spine Complete  01/16/2013   *RADIOLOGY REPORT*  Clinical Data: Fall, back pain.  LUMBAR SPINE - COMPLETE 4+ VIEW  Comparison: None.  Findings: There are five lumbar-type vertebral bodies.  No fracture or malalignment.  Disc spaces well maintained.  SI joints are symmetric.  IMPRESSION: No acute bony abnormality.   Original Report Authenticated By: Charlett Nose, M.D.   Mr Lumbar Spine Wo Contrast  02/01/2013   *RADIOLOGY REPORT*  Clinical Data: Low back pain.  Fall.  Lytic lesion in lumbar vertebral pedicle.  MRI LUMBAR SPINE WITHOUT CONTRAST  Technique:  Multiplanar and multiecho pulse sequences of the lumbar spine were obtained without intravenous contrast.  Comparison: 02/01/2013  Findings: The lytic mass involving the right pedicle, transverse process, and body of L5 measures 6.2 x 6.4 by 4.7 cm and has intermediate to low T1 signal characteristics and high T2 signal characteristics.  A similar signal characteristics are present in a 2.0 by 1.9 cm vertebral body lesion at the T12 level.  There are innumerable speckled T2 signal hyperintense lesions throughout the visualized lumbar spine and upper sacrum measuring between one and 8 mm also with similar imaging characteristics.  Visualized portions the kidneys appear unremarkable and no adenopathy in the visualized portion of the retroperitoneum noted.  No significant  vertebral subluxation.  The pedicles in the lumbar spine are mildly congenitally narrow.  I do not see a definite intradural lesion, although IV contrast was not administered.  Additional findings at individual levels are as follows:  L1-2:  No impingement.  L2-3:  Borderline subarticular lateral recess stenosis bilaterally due to mild facet arthropathy.  L3-4:  Mild left and borderline right subarticular lateral recess stenosis due to minimal disc bulge and facet arthropathy.  L4-5:  Poor definition of the right L4 nerve separate from the mass arising from the right L5 vertebra in the lateral extraforaminal space.  The right L5 nerve roots abut the tumor from the expanded right L5 pedicle in the lateral extraforaminal space.  Disc bulge noted.  L5-S1:  The right L5 nerve appears partially encased by the right- sided tumor.  IMPRESSION:  1.  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  Dominant 6.4 cm expansile mass of the right L5 pedicle, transverse process, and body.  This mass at least partially encases the right L5 spinal nerve and abuts the right L4 nerve in the lateral extraforaminal space. 2.  Mild left subarticular lateral recess stenosis at L3-4 due to disc bulge and facet arthropathy.   Original Report Authenticated By: Gaylyn Rong, M.D.   US Renal  02/02/2013   *RADIOLOGY REPORT*  Clinical Data: Hypercalcemia, question renal lesion, unable to have CT with contrast due to renal dysfunction  RENAL/URINARY TRACT ULTRASOUND COMPLETE  Comparison:  CT abdomen and pelvis 05/24/2010  Findings:  Right Kidney:  13.1 cm length.  Normal cortical thickness and echogenicity.  No mass, hydronephrosis or shadowing calcification. No perinephric fluid.  Left Kidney:  12.2 cm length.  Normal cortical thickness and echogenicity.  Small extrarenal pelvis.  No mass, hydronephrosis or shadowing calcification.  No perinephric  fluid.  Bladder:  Normal appearance with a calculated prevoid volume of 160 ml.  IMPRESSION: Unremarkable renal ultrasound.   Original Report Authenticated By: Ulyses Southward, M.D.   Ct Biopsy  02/04/2013   *RADIOLOGY REPORT*  Clinical Data: Multiple bone lesions of the spine with large destructive mass  involving the right paraspinous tissues and destroying part of the right L5 vertebral body.  CT GUIDED CORE BIOPSY OF RIGHT PARASPINOUS SOFT TISSUE MASS  Sedation:   2.0 mg IV Versed;  100 mcg IV Fentanyl  Total Moderate Sedation Time: 10 minutes.  Procedure:  The procedure risks, benefits, and alternatives were explained to the patient.  Questions regarding the procedure were encouraged and answered.  The patient understands and consents to the procedure.  The lower lumbar paraspinous region was prepped with Betadine in a sterile fashion, and a sterile drape was applied covering the operative field.  A sterile gown and sterile gloves were used for the procedure.  Local anesthesia was provided with 1% Lidocaine.  CT was performed in a prone position.  A 17 gauge needle was advanced under CT guidance to the level of a right lower lumbar paraspinous mass.  Four separate 18 gauge core biopsy samples were obtained and submitted in formalin.  Complications: None  Findings: CT shows a large soft tissue mass in the right paraspinous region destroying the L5 vertebral body and measuring up to 6 cm in greatest diameter.  Multiple other smaller lytic lesions are identified in the visualized spine as well as both iliac bones and the sacrum.  Solid tissue was obtained from the paraspinous mass.  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   Original Report Authenticated By: Irish Lack, M.D.   Dg Abd 2 Views  02/01/2013   **ADDENDUM** CREATED: 02/01/2013 11:42:07  Additionally noted is a lytic lesion in the left iliac bone and a healing right lateral 7th rib  fracture (likely pathologic).  **END ADDENDUM** SIGNED BY: Charline Bills, M.D.  02/01/2013   *RADIOLOGY REPORT*  Clinical Data: Abdominal pain, constipation  ABDOMEN - 2 VIEW  Comparison: CT abdomen pelvis dated 05/24/2010  Findings: Nonobstructive bowel gas pattern.  Moderate stool in the right colon.  No evidence of free air under the diaphragm on the upright view.  Lytic lesion involving the right pedicle at L5.  IMPRESSION: No evidence of small bowel obstruction or free air.  Moderate stool in the right colon.  Lytic lesion involving the right pedicle at L5.  MRI lumbar spine with/without contrast is suggested for further evaluation.  These results were called by telephone on 02/01/2013 at 2035 hours to Dr. Radford Pax, who verbally acknowledged these results.   Original Report Authenticated By: Charline Bills, M.D.      IMPRESSION: Probable multiple myeloma. As above the patient's biopsy from earlier today is pending. Pending confirmation of malignancy the patient would be a good candidate for a short course of radiation therapy directed at the lower lumbar spine and associated soft tissue mass. I anticipate approximately 3 weeks of radiation therapy as part of patient's overall management.  PLAN: Simulation and planning later this week. I anticipate starting radiation therapy Thursday after biopsy results confirm a malignancy. I spent 55 minutes minutes face to face with the patient and more than 50% of that time was spent in counseling and/or coordination of care.   ------------------------------------------------  -----------------------------------  Billie Lade, PhD, MD

## 2013-02-04 NOTE — Progress Notes (Signed)
The calcium is coming down nicely. It is 13.4 today. He received calcitonin and Aredia.  He still not able to eat solids. He tried to advance his diet yesterday and unfortunately had nausea vomiting.  He is going for his biopsy today.  His LDH is normal. Alpha-fetoprotein is normal. We'll have to repeat a beta hCG.  His renal ultrasound also is unremarkable.  Is still having some pain in the right back and hip. I suspect this is because of the tumor.  He will need radiation unless we're dealing with lymphoma.  His vital signs are stable. No focal findings on physical exam.  Everything will come down to the results of the biopsy. This will dictate further testing and management recommendations.  We can probably cut his IV fluids back a little bit.  I will get electrophoresis studies on his urine and serum. I cannot explain why he has the renal insufficiency. It is possible that he may have light chain disease that we're not picking up in his blood work.  Pete ?E.  2 Cor 4:17

## 2013-02-04 NOTE — Progress Notes (Signed)
Received patient post biopsy, still drowsy but easily arousable. Puncture site with band aid, stained, no s/s of bleeding.

## 2013-02-04 NOTE — Progress Notes (Signed)
[facility] PROGRESS NOTE  Mark [name] JYN:[id number] DOB: [birth year] DOA: 02/01/2013 PCP: No primary provider on file.  Assessment/Plan: 1-Hypercalcemia: patient with elevated calcium, renal failure and lytic lesions on abd  x-ray/MRI. High concerns for MM  -phosphorus and TSH WNL, PTH and SPEP/UPEP pending  -Will continue IVF's -continue decadron as recommended by oncologist  -calcium 13.4; will repeat BMET in am. (Status post 4 doses of calcitonin and 1 dose of zometa) -Hem/Onc has been consulted and will follow rec's  2-Lytic lesion of bone on x-ray: MRI has confirmed lytic lesions and has demonstrated multiple others affecting his spine.  -will follow Hem/Onc rec's as patient most likely with MM or other kind of malignancy. -will follow biopsy results not direct further care and treatment.  -biopsy plan for today 7/7 -further   3-Nausea with vomiting: will continue PRN antiemetics and will advance as tolerated. Currently on CLD  4-ARF (acute renal failure): due to presumably MM and dehydration/NSAID's use.  -will continue IVF's, but will decrease rate to 75cc/hr -Cr 1.5 today  -UA w/o signs of infection  -renal US unremarkable  -avoid nephrotoxic agents (especially NSAID's)  -will follow Cr trend   5-Hyperglycemia: with hx of first degree relative with DM; no prior hx of DM.  -A1C 5.2 -will monitor as he is on decadron; but not indication for hypoglycemic therapy at this point.  6-Constipation: most likely due to recent use of narcotics and hypercalcemia. Will continue miralax and PRN enemas as requested by patient.  DVT: will use heparin.   Code Status: Full Family Communication: no wife at bedside Disposition Plan: home when medically stable; biopsy today   Consultants:  Hem/Onc  Procedures:  Lytic mass biopsy schedule for 7/7  See below for x-ray reports  Antibiotics:  none  HPI/Subjective: Afebrile. Reports being hungry, no further nausea,  vomiting or abdominal pain.  Objective: Filed Vitals:   02/03/13 0601 02/03/13 1335 02/03/13 2130 02/04/13 0539  BP: 144/99 130/65 147/86 127/87  Pulse: 70 63 66 66  Temp: 98.4 F (36.9 C) 98.3 F (36.8 C) 98.4 F (36.9 C) 97.8 F (36.6 C)  TempSrc: Oral Oral Oral Oral  Resp: 16 16 18 16   Height:      Weight:      SpO2: 97% 100% 100% 99%    Intake/Output Summary (Last 24 hours) at 02/04/13 1033 Last data filed at 02/04/13 0700  Gross per 24 hour  Intake 5068.75 ml  Output   2350 ml  Net 2718.75 ml   Filed Weights   02/01/13 0333  Weight: 87.771 kg (193 lb 8 oz)    Exam:   General:  Afebrile, NAD; feeling better  Cardiovascular: S1 and S2, no rubs or gallops  Respiratory: CTA bilaterally  Abdomen: no distended, positive BS; patient reports no tenderness; no guarding on exam  Musculoskeletal: no edema, no cyanosis  Data Reviewed: Basic Metabolic Panel:  Recent Labs Lab 02/01/13 0415 02/01/13 0528 02/01/13 0934 02/01/13 1230 02/02/13 0515 02/03/13 0514 02/04/13 0505  NA 136  --   --   --  135 138 135  K 4.1  --   --   --  4.8 4.8 4.7  CL 97  --   --   --  97 102 103  CO2 31  --   --   --  25 27 26   GLUCOSE 166*  --   --   --  137* 136* 146*  BUN 29*  --   --   --  30* 36* 33*  CREATININE 1.68*  --   --   --  1.50* 1.72* 1.55*  CALCIUM >15.0* >15.0* >15.0*  --  14.5* 14.9* 13.4*  PHOS  --   --   --  4.4 4.2  --  3.7   Liver Function Tests:  Recent Labs Lab 02/01/13 0415 02/02/13 0515 02/04/13 0505  AST 20  --   --   ALT 18  --   --   ALKPHOS 85  --   --   BILITOT 0.5  --   --   PROT 7.9  --   --   ALBUMIN 3.9 3.9 3.6    Recent Labs Lab 02/01/13 0415  LIPASE 23   CBC:  Recent Labs Lab 02/01/13 0415 02/04/13 0505  WBC 6.0 11.9*  NEUTROABS 4.5  --   HGB 13.0 12.7*  HCT 37.8* 35.3*  MCV 90.4 89.1  PLT 268 286     Studies: No results found.  Scheduled Meds: . dexamethasone  8 mg Intravenous Q8H  . heparin  5,000 Units  Subcutaneous Q8H  . pantoprazole  40 mg Oral BID  . sodium chloride  3 mL Intravenous Q12H   Continuous Infusions: . sodium chloride 75 mL/hr (02/04/13 0909)    Active Problems:   Hypercalcemia   Lytic lesion of bone on x-ray   Nausea with vomiting   ARF (acute renal failure)    Time spent: >30 minutes   Mark [name]  [facility] Pager [phone]. If 7PM-7AM, please contact night-coverage at [email or web address], password [facility] 02/04/2013, 10:33 AM  LOS: 3 days

## 2013-02-05 ENCOUNTER — Telehealth: Payer: Self-pay | Admitting: *Deleted

## 2013-02-05 ENCOUNTER — Ambulatory Visit
Admit: 2013-02-05 | Discharge: 2013-02-05 | Disposition: A | Discharge status: Home / Self Care | Payer: BC Managed Care – PPO | Attending: Radiation Oncology | Admitting: Radiation Oncology

## 2013-02-05 DIAGNOSIS — M899 Disorder of bone, unspecified: Secondary | ICD-10-CM

## 2013-02-05 LAB — BASIC METABOLIC PANEL
BUN: 37 mg/dL — ABNORMAL HIGH (ref 6–23)
CO2: 24 mEq/L (ref 19–32)
Chloride: 101 mEq/L (ref 96–112)
Glucose, Bld: 171 mg/dL — ABNORMAL HIGH (ref 70–99)
Potassium: 4.5 mEq/L (ref 3.5–5.1)

## 2013-02-05 LAB — PROTEIN ELECTROPH W RFLX QUANT IMMUNOGLOBULINS
Albumin ELP: 55.3 % — ABNORMAL LOW (ref 55.8–66.1)
Total Protein ELP: 8.2 g/dL (ref 6.0–8.3)

## 2013-02-05 LAB — UIFE/LIGHT CHAINS/TP QN, 24-HR UR
Albumin, U: DETECTED
Alpha 1, Urine: DETECTED — AB
Free Kappa/Lambda Ratio: 467 ratio — ABNORMAL HIGH (ref 2.04–10.37)
Total Protein, Urine: 47.7 mg/dL

## 2013-02-05 NOTE — Telephone Encounter (Signed)
Spoke w/Grace RN for pt in room 1409 and informed her pt is for ct sim today 10:30 am. She was aware. She states pt is alert, oriented x 3, NS @ 75 cc/hr in LFA. She states pt has not c/o pain since last night. Informed her that ct sim may take 45 -60 minutes, will require pt to lie flat on hard table and not move. Requested she assess pt for pain prior to his sim appointment and medicate if needed. Delorise Shiner verbalized agreement, understanding.

## 2013-02-05 NOTE — Progress Notes (Signed)
  Radiation Oncology         [phone] ________________________________  Name: Mark [name] MRN: [id number]  Date: 02/05/2013  DOB: [birth year]  SIMULATION AND TREATMENT PLANNING NOTE - INPATIENT  DIAGNOSIS:  Probable multiple myeloma  NARRATIVE:  The patient was brought to the CT Simulation planning suite.  Identity was confirmed.  All relevant records and images related to the planned course of therapy were reviewed.  The patient freely provided informed written consent to proceed with treatment after reviewing the details related to the planned course of therapy. The consent form was witnessed and verified by the simulation staff.  Then, the patient was set-up in a stable reproducible  supine position for radiation therapy.  CT images were obtained.  Surface markings were placed.  The CT images were loaded into the planning software.  Then the target and avoidance structures were contoured.  Treatment planning then occurred.  The radiation prescription was entered and confirmed.  Then, I designed and supervised the construction of a total of 5 medically necessary complex treatment devices.  I have requested : Isodose Plan.  I have ordered:dose calc.  PLAN:  The patient will receive 35 Gy in 14 fractions  ________________________________  -----------------------------------  Billie Lade, PhD, MD

## 2013-02-05 NOTE — Progress Notes (Signed)
CRITICAL VALUE ALERT  Critical value received:  Calcium >18 drawn 02/01/13  Date of notification:  02/05/13  Time of notification: 1700  Critical value read back:no  Nurse who received alert:  Leafy Half, RN  MD notified (1st page): Time of first page MD notified (2nd page):Consistent with previous results  Time of second page:z  Responding MD:   Time MD responded: 1

## 2013-02-05 NOTE — Progress Notes (Signed)
[facility] PROGRESS NOTE  Odis Turck ZOX:[id number] DOB: Jul 22, 1971 DOA: 02/01/2013 PCP: No primary provider on file.  Assessment/Plan: 1-Hypercalcemia: patient with elevated calcium, renal failure and lytic lesions on abd  x-ray/MRI. High concerns for MM  -phosphorus and TSH WNL, PTH, Kappa/Lambda light chain and SPEP/UPEP pending. -Will change IVF's to [facility] -continue decadron as recommended by oncologist  -calcium 11.6; will repeat BMET in am. (Status post 4 doses of calcitonin and 1 dose of zometa) -Hem/Onc has been consulted and will follow rec's  2-Lytic lesion of bone on x-ray: MRI has confirmed lytic lesions and has demonstrated multiple others affecting his spine.  -will follow Hem/Onc rec's as patient most likely with MM or other kind of malignancy. -biopsy performed on 7/7 -further decisions to be made after results from biopsy are back.  3-Nausea with vomiting: will continue PRN antiemetics. Most likely due to hypercalcemia. -now tolerating diet. -will change IVF's to [facility] and monitor  4-ARF (acute renal failure): due to presumably MM and dehydration/NSAID's use.  -will continue IVF's, but will decrease rate to 75cc/hr -Cr 1.53 today  -UA w/o signs of infection  -renal US unremarkable  -avoid nephrotoxic agents (especially NSAID's)  -will follow Cr trend   5-Hyperglycemia: with hx of first degree relative with DM; no prior hx of DM.  -A1C 5.2 -will monitor as he is on decadron; but not indication for hypoglycemic therapy at this point.  6-Constipation: most likely due to recent use of narcotics and hypercalcemia. Will continue miralax and PRN enemas  DVT: will use heparin.   Code Status: Full Family Communication: no wife at bedside Disposition Plan: home when medically stable; biopsy today   Consultants:  Hem/Onc (Dr. Myna Hidalgo)  Radiation oncology (Dr. Roselind Messier)  Procedures:  Lytic mass biopsy done on 7/7  See below for x-ray  reports  Antibiotics:  none  HPI/Subjective: Afebrile. Reports being hungry, no further nausea, vomiting or abdominal pain. Tolerating diet.  Objective: Filed Vitals:   02/04/13 1555 02/04/13 1704 02/04/13 2109 02/05/13 0606  BP: 130/88 138/99 131/87 130/70  Pulse: 57 57 78 61  Temp: 98.3 F (36.8 C)  97.8 F (36.6 C) 98.3 F (36.8 C)  TempSrc: Oral  Oral Oral  Resp: 18 18 18 18   Height:      Weight:      SpO2: 99% 99% 98% 97%    Intake/Output Summary (Last 24 hours) at 02/05/13 1204 Last data filed at 02/05/13 0935  Gross per 24 hour  Intake 2423.75 ml  Output      0 ml  Net 2423.75 ml   Filed Weights   02/01/13 0333  Weight: 87.771 kg (193 lb 8 oz)    Exam:   General:  Afebrile, NAD; feeling better; no abd, pain, no N/V and tolerating diet now.  Cardiovascular: S1 and S2, no rubs or gallops  Respiratory: CTA bilaterally  Abdomen: no distended, positive BS; patient reports no tenderness; no guarding on exam  Musculoskeletal: no edema, no cyanosis  Data Reviewed: Basic Metabolic Panel:  Recent Labs Lab 02/01/13 0415  02/01/13 0934 02/01/13 1230 02/02/13 0515 02/03/13 0514 02/04/13 0505 02/05/13 0410  NA 136  --   --   --  135 138 135 132*  K 4.1  --   --   --  4.8 4.8 4.7 4.5  CL 97  --   --   --  97 102 103 101  CO2 31  --   --   --  25 27 26  24  GLUCOSE 166*  --   --   --  137* 136* 146* 171*  BUN 29*  --   --   --  30* 36* 33* 37*  CREATININE 1.68*  --   --   --  1.50* 1.72* 1.55* 1.53*  CALCIUM >15.0*  < > >15.0*  --  14.5* 14.9* 13.4* 11.6*  PHOS  --   --   --  4.4 4.2  --  3.7  --   < > = values in this interval not displayed. Liver Function Tests:  Recent Labs Lab 02/01/13 0415 02/02/13 0515 02/04/13 0505  AST 20  --   --   ALT 18  --   --   ALKPHOS 85  --   --   BILITOT 0.5  --   --   PROT 7.9  --   --   ALBUMIN 3.9 3.9 3.6    Recent Labs Lab 02/01/13 0415  LIPASE 23   CBC:  Recent Labs Lab 02/01/13 0415  02/04/13 0505  WBC 6.0 11.9*  NEUTROABS 4.5  --   HGB 13.0 12.7*  HCT 37.8* 35.3*  MCV 90.4 89.1  PLT 268 286     Studies: Ct Biopsy  02/04/2013   *RADIOLOGY REPORT*  Clinical Data: Multiple bone lesions of the spine with large destructive mass involving the right paraspinous tissues and destroying part of the right L5 vertebral body.  CT GUIDED CORE BIOPSY OF RIGHT PARASPINOUS SOFT TISSUE MASS  Sedation:   2.0 mg IV Versed;  100 mcg IV Fentanyl  Total Moderate Sedation Time: 10 minutes.  Procedure:  The procedure risks, benefits, and alternatives were explained to the patient.  Questions regarding the procedure were encouraged and answered.  The patient understands and consents to the procedure.  The lower lumbar paraspinous region was prepped with Betadine in a sterile fashion, and a sterile drape was applied covering the operative field.  A sterile gown and sterile gloves were used for the procedure.  Local anesthesia was provided with 1% Lidocaine.  CT was performed in a prone position.  A 17 gauge needle was advanced under CT guidance to the level of a right lower lumbar paraspinous mass.  Four separate 18 gauge core biopsy samples were obtained and submitted in formalin.  Complications: None  Findings: CT shows a large soft tissue mass in the right paraspinous region destroying the L5 vertebral body and measuring up to 6 cm in greatest diameter.  Multiple other smaller lytic lesions are identified in the visualized spine as well as both iliac bones and the sacrum.  Solid tissue was obtained from the paraspinous mass.  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   Original Report Authenticated By: Irish Lack, M.D.    Scheduled Meds: . dexamethasone  8 mg Intravenous Q8H  . heparin  5,000 Units Subcutaneous Q8H  . pantoprazole  40 mg Oral BID  . sodium chloride  3 mL Intravenous Q12H   Continuous Infusions: . sodium  chloride 75 mL/hr (02/05/13 0935)    Active Problems:   Hypercalcemia   Lytic lesion of bone on x-ray   Nausea with vomiting   ARF (acute renal failure)    Time spent: >30 minutes   Sirron Francesconi  [facility] Pager [phone]. If 7PM-7AM, please contact night-coverage at [email or web address], password [facility] 02/05/2013, 12:04 PM  LOS: 4 days

## 2013-02-05 NOTE — Telephone Encounter (Signed)
Received call from [facility] for pt in rm 1409. She states she will give pt Morphine Sulfate 1 mg IV prior to his ct sim today. Debby Bud for her assistance with this pt.

## 2013-02-06 ENCOUNTER — Inpatient Hospital Stay (HOSPITAL_COMMUNITY): Payer: BC Managed Care – PPO

## 2013-02-06 LAB — CBC
HCT: 34.3 % — ABNORMAL LOW (ref 39.0–52.0)
Hemoglobin: 12 g/dL — ABNORMAL LOW (ref 13.0–17.0)
MCH: 30.8 pg (ref 26.0–34.0)
MCHC: 35 g/dL (ref 30.0–36.0)
RBC: 3.9 MIL/uL — ABNORMAL LOW (ref 4.22–5.81)

## 2013-02-06 LAB — BASIC METABOLIC PANEL
BUN: 33 mg/dL — ABNORMAL HIGH (ref 6–23)
Chloride: 99 mEq/L (ref 96–112)
Glucose, Bld: 240 mg/dL — ABNORMAL HIGH (ref 70–99)
Potassium: 4.4 mEq/L (ref 3.5–5.1)

## 2013-02-06 LAB — GLUCOSE, CAPILLARY: Glucose-Capillary: 296 mg/dL — ABNORMAL HIGH (ref 70–99)

## 2013-02-06 MED ORDER — INSULIN ASPART 100 UNIT/ML ~~LOC~~ SOLN
0.0000 [IU] | Freq: Three times a day (TID) | SUBCUTANEOUS | Status: DC
  Administered 2013-02-06: 8 [IU] via SUBCUTANEOUS

## 2013-02-06 MED ORDER — SODIUM CHLORIDE 0.9 % IV SOLN
INTRAVENOUS | Status: DC
  Administered 2013-02-06 (×2): via INTRAVENOUS

## 2013-02-06 NOTE — Progress Notes (Signed)
Still awaiting the results of his biopsy of the right paraspinal tumor.  His serum kappa light chain is elevated. His 24-hour urine is pending.  I may now suspect that we are dealing with either a plasmacytoma or myeloma.  He will need a bone marrow biopsy. He will also need a bone survey.  He feels better. His calcium is coming down nicely. His appetite is improving. He's not having nausea or vomiting.  His vital signs are all stable. His labs are also looking okay. Calcium is 10.6.  There are no changes [place] physical exam from yesterday. He has good strength in his legs. Abdomen is soft. There is no palpable hepatospleno megaly. Lungs are clear. Cardiac exam regular rate and rhythm with no murmurs rubs or bruits.  Hopefully, we can get the bone marrow biopsy done today or tomorrow. The bone survey will be done today. He can have his radiation as an outpatient. If all goes well, we might be oh to get him out on Thursday.  Whether he needs chemotherapy will be dictated by the bone marrow biopsy and possibly the bone survey.  If he has myeloma, he will ultimately need a stem cell transplant.  I talked to the patient and his wife this morning. I explained to them what might be happening in the future. He understands well.  Pete E.  Isaiah 41:10

## 2013-02-06 NOTE — Progress Notes (Signed)
Inpatient Diabetes Program Recommendations  AACE/ADA: New Consensus Statement on Inpatient Glycemic Control (2013)  Target Ranges:  Prepandial:   less than 140 mg/dL      Peak postprandial:   less than 180 mg/dL (1-2 hours)      Critically ill patients:  140 - 180 mg/dL   Reason for Visit: Results for Mark [name], Mark [name] (MRN [id number]) as of 02/06/2013 10:15  Ref. Range 02/06/2013 05:05  Glucose Latest Range: 70-99 mg/dL 147 (H)   No history of diabetes. Lab glucose=240 mg/dL this morning.  Patient is receiving IV Decadron which is likely contributing to elevated glucose.  Please consider moderate Novolog correction tid with meals and HS scale while on steroids.

## 2013-02-06 NOTE — Progress Notes (Signed)
[facility] PROGRESS NOTE  Elih Mooney ZOX:[id number] DOB: [birth year] DOA: 02/01/2013 PCP: No primary provider on file.  Assessment/Plan:  1-Hypercalcemia: Of malignancy probably.  Patient presents  with elevated calcium, renal failure and lytic lesions on abd x-ray/MRI. High concerns for MM  -phosphorus and TSH WNL,  PTH less than 2.5, Kappa/Lambda light chain elevated at 49.  SPEP/UPEP : positive for free Kappa light chains in urine.   -Continue with IV fluids.  -continue decadron as recommended by oncologist  _Status post 4 doses of calcitonin and 1 dose of zometa)  None survey ordered, BM Biopsy tomorrow.   2-Lytic lesion of bone on x-ray and 6.4 cm expansile mass of the right L5  pedicle, transverse process, and body : MRI has confirmed lytic lesions and has demonstrated multiple others affecting his spine.  -Patient will start Radiation therapy 7-10.  -biopsy performed on 7/7   3-Nausea with vomiting:  Most likely due to hypercalcemia.  -now tolerating diet.  -Resolved.   4-ARF (acute renal failure): due to presumably MM and dehydration/NSAID's use.  -IV fluids 50 cc/Hr. Creatinine trending down.  -Cr 1.53 today  -UA w/o signs of infection  -renal US unremarkable  -avoid nephrotoxic agents (especially NSAID's)   5-Hyperglycemia: with hx of first degree relative with DM; no prior hx of DM.  -A1C 5.2  -will monitor as he is on decadron; SSI.  6-Constipation: most likely due to recent use of narcotics and hypercalcemia. Will continue miralax and PRN enemas  DVT: will use heparin   Code Status: Full  Family Communication: Care discussed with patient.  Disposition Plan: home probably tomorrow.    Consultants: Hem/Onc (Dr. Myna Hidalgo)  Radiation oncology (Dr. Roselind Messier)   Procedures: Lytic mass biopsy done on 7/7  Bone Marrow Bx 7-10    Antibiotics:  None  HPI/Subjective: Feeling well. No significant Left side back pain.   Objective: Filed Vitals:    02/05/13 0606 02/05/13 1329 02/05/13 2027 02/06/13 0605  BP: 130/70 125/76 136/88 131/84  Pulse: 61 62 70 66  Temp: 98.3 F (36.8 C) 98.2 F (36.8 C) 98.6 F (37 C) 98.3 F (36.8 C)  TempSrc: Oral Oral Oral Oral  Resp: 18 18 16 16   Height:      Weight:      SpO2: 97% 100% 100% 99%    Intake/Output Summary (Last 24 hours) at 02/06/13 1225 Last data filed at 02/06/13 0900  Gross per 24 hour  Intake    960 ml  Output      0 ml  Net    960 ml   Filed Weights   02/01/13 0333  Weight: 87.771 kg (193 lb 8 oz)    Exam:   General:  No distress.   Cardiovascular: S 1, S 2 RRR  Respiratory: CTA  Abdomen: Bs present, soft, nt  Musculoskeletal: no edema.   Data Reviewed: Basic Metabolic Panel:  Recent Labs Lab 02/01/13 0415  02/01/13 1230 02/02/13 0515 02/03/13 0514 02/04/13 0505 02/05/13 0410 02/06/13 0505  NA 136  --   --  135 138 135 132* 131*  K 4.1  --   --  4.8 4.8 4.7 4.5 4.4  CL 97  --   --  97 102 103 101 99  CO2 31  --   --  25 27 26 24 24   GLUCOSE 166*  --   --  137* 136* 146* 171* 240*  BUN 29*  --   --  30* 36* 33* 37* 33*  CREATININE 1.68*  --   --  1.50* 1.72* 1.55* 1.53* 1.45*  CALCIUM >15.0*  < > >15.0* 14.5* 14.9* 13.4* 11.6* 10.6*  PHOS  --   --  4.4 4.2  --  3.7  --   --   < > = values in this interval not displayed. Liver Function Tests:  Recent Labs Lab 02/01/13 0415 02/02/13 0515 02/04/13 0505  AST 20  --   --   ALT 18  --   --   ALKPHOS 85  --   --   BILITOT 0.5  --   --   PROT 7.9  --   --   ALBUMIN 3.9 3.9 3.6    Recent Labs Lab 02/01/13 0415  LIPASE 23   No results found for this basename: AMMONIA,  in the last 168 hours CBC:  Recent Labs Lab 02/01/13 0415 02/04/13 0505 02/06/13 0505  WBC 6.0 11.9* 14.0*  NEUTROABS 4.5  --   --   HGB 13.0 12.7* 12.0*  HCT 37.8* 35.3* 34.3*  MCV 90.4 89.1 87.9  PLT 268 286 325   Cardiac Enzymes: No results found for this basename: CKTOTAL, CKMB, CKMBINDEX, TROPONINI,  in  the last 168 hours BNP (last 3 results) No results found for this basename: PROBNP,  in the last 8760 hours CBG: No results found for this basename: GLUCAP,  in the last 168 hours  No results found for this or any previous visit (from the past 240 hour(s)).   Studies: Ct Biopsy  02/04/2013   *RADIOLOGY REPORT*  Clinical Data: Multiple bone lesions of the spine with large destructive mass involving the right paraspinous tissues and destroying part of the right L5 vertebral body.  CT GUIDED CORE BIOPSY OF RIGHT PARASPINOUS SOFT TISSUE MASS  Sedation:   2.0 mg IV Versed;  100 mcg IV Fentanyl  Total Moderate Sedation Time: 10 minutes.  Procedure:  The procedure risks, benefits, and alternatives were explained to the patient.  Questions regarding the procedure were encouraged and answered.  The patient understands and consents to the procedure.  The lower lumbar paraspinous region was prepped with Betadine in a sterile fashion, and a sterile drape was applied covering the operative field.  A sterile gown and sterile gloves were used for the procedure.  Local anesthesia was provided with 1% Lidocaine.  CT was performed in a prone position.  A 17 gauge needle was advanced under CT guidance to the level of a right lower lumbar paraspinous mass.  Four separate 18 gauge core biopsy samples were obtained and submitted in formalin.  Complications: None  Findings: CT shows a large soft tissue mass in the right paraspinous region destroying the L5 vertebral body and measuring up to 6 cm in greatest diameter.  Multiple other smaller lytic lesions are identified in the visualized spine as well as both iliac bones and the sacrum.  Solid tissue was obtained from the paraspinous mass.  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   Original Report Authenticated By: Irish Lack, M.D.   Dg Bone Survey Met  02/06/2013   *RADIOLOGY REPORT*  Clinical Data:  Possible myeloma.  METASTATIC BONE SURVEY  Comparison: CT 02/04/2013  Findings: Recent biopsy of the right paraspinous mass at L5.  There is destruction of the right L5 pedicle and lateral vertebral body compatible with an aggressive process.  In addition there are lytic lesions in the iliac bone bilaterally, best  seen on the CT.  No pathologic fracture is identified.  No skull lesion is identified.  Multiple lytic lesions are seen in the skeleton, suspicious for myeloma.  This includes the spinous process of C6.  There are probable bilateral rib lesions.  There is a possible lesion in the right scapula.  There are small lytic lesions in the right humerus. There is a small lytic lesion in the left intertrochanteric femur. There is a larger slightly expansile lytic lesion in the right mid femur.  IMPRESSION: Multiple skeletal lesions are present suspicious for myeloma. These are most prominent in the pelvis and the right L5 vertebral body.  No pathologic fracture.   Original Report Authenticated By: Janeece Riggers, M.D.    Scheduled Meds: . dexamethasone  8 mg Intravenous Q8H  . heparin  5,000 Units Subcutaneous Q8H  . insulin aspart  0-15 Units Subcutaneous TID WC  . pantoprazole  40 mg Oral BID  . sodium chloride  3 mL Intravenous Q12H   Continuous Infusions: . sodium chloride 20 mL/hr (02/05/13 1207)  . sodium chloride      Active Problems:   Hypercalcemia   Lytic lesion of bone on x-ray   Nausea with vomiting   ARF (acute renal failure)    Time spent: 35 minutes.     Miette Molenda  [facility] Pager [phone]. If 7PM-7AM, please contact night-coverage at [email or web address], password [facility] 02/06/2013, 12:25 PM  LOS: 5 days

## 2013-02-06 NOTE — Progress Notes (Signed)
Subjective: Pr s/p L5 paraspinal mass biopsy a few days ago with ongoing workup for possible plasmacytoma or myeloma. IR is now requested to do bone marrow biopsy as well. He is doing fine from his other biopsy. PMHx and chart, meds reviewed.  Objective: Physical Exam: BP 131/84  Pulse 66  Temp(Src) 98.3 F (36.8 C) (Oral)  Resp 16  Ht 6' (1.829 m)  Wt 193 lb 8 oz (87.771 kg)  BMI 26.24 kg/m2  SpO2 99% ENT: unremarkable Lungs: CTA without w/r/r Heart: Regular   Labs: CBC  Recent Labs  02/04/13 0505 02/06/13 0505  WBC 11.9* 14.0*  HGB 12.7* 12.0*  HCT 35.3* 34.3*  PLT 286 325   BMET  Recent Labs  02/05/13 0410 02/06/13 0505  NA 132* 131*  K 4.5 4.4  CL 101 99  CO2 24 24  GLUCOSE 171* 240*  BUN 37* 33*  CREATININE 1.53* 1.45*  CALCIUM 11.6* 10.6*   LFT  Recent Labs  02/04/13 0505  ALBUMIN 3.6   PT/INR No results found for this basename: LABPROT, INR,  in the last 72 hours   Studies/Results: Ct Biopsy  02/04/2013   *RADIOLOGY REPORT*  Clinical Data: Multiple bone lesions of the spine with large destructive mass involving the right paraspinous tissues and destroying part of the right L5 vertebral body.  CT GUIDED CORE BIOPSY OF RIGHT PARASPINOUS SOFT TISSUE MASS  Sedation:   2.0 mg IV Versed;  100 mcg IV Fentanyl  Total Moderate Sedation Time: 10 minutes.  Procedure:  The procedure risks, benefits, and alternatives were explained to the patient.  Questions regarding the procedure were encouraged and answered.  The patient understands and consents to the procedure.  The lower lumbar paraspinous region was prepped with Betadine in a sterile fashion, and a sterile drape was applied covering the operative field.  A sterile gown and sterile gloves were used for the procedure.  Local anesthesia was provided with 1% Lidocaine.  CT was performed in a prone position.  A 17 gauge needle was advanced under CT guidance to the level of a right lower lumbar paraspinous mass.   Four separate 18 gauge core biopsy samples were obtained and submitted in formalin.  Complications: None  Findings: CT shows a large soft tissue mass in the right paraspinous region destroying the L5 vertebral body and measuring up to 6 cm in greatest diameter.  Multiple other smaller lytic lesions are identified in the visualized spine as well as both iliac bones and the sacrum.  Solid tissue was obtained from the paraspinous mass.  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   Original Report Authenticated By: Irish Lack, M.D.    Assessment/Plan: Hypercalcemia and bone lesions concerning for myeloma or plasmacytoma Discussed plans for bone marrow biopsy. Explained procedure, risks, complications, use of sedation. The pt ate breakfast this morning and therefore procedure cannot be done today. This will be done tomorrow am. Consent signed in chart    LOS: 5 days    [name] PA-C 02/06/2013 9:31 AM

## 2013-02-07 ENCOUNTER — Inpatient Hospital Stay (HOSPITAL_COMMUNITY): Payer: BC Managed Care – PPO

## 2013-02-07 ENCOUNTER — Encounter (HOSPITAL_COMMUNITY): Payer: Self-pay | Admitting: Radiology

## 2013-02-07 ENCOUNTER — Ambulatory Visit
Admit: 2013-02-07 | Discharge: 2013-02-07 | Disposition: A | Discharge status: Home / Self Care | Payer: BC Managed Care – PPO | Attending: Radiation Oncology | Admitting: Radiation Oncology

## 2013-02-07 DIAGNOSIS — C9000 Multiple myeloma not having achieved remission: Secondary | ICD-10-CM

## 2013-02-07 LAB — UIFE/LIGHT CHAINS/TP QN, 24-HR UR
Alpha 1, Urine: DETECTED — AB
Free Kappa Lt Chains,Ur: 145 mg/dL — ABNORMAL HIGH (ref 0.14–2.42)
Free Kappa/Lambda Ratio: 1318.18 ratio — ABNORMAL HIGH (ref 2.04–10.37)
Free Lambda Excretion/Day: 2.48 mg/d
Free Lambda Lt Chains,Ur: 0.11 mg/dL (ref 0.02–0.67)
Free Lt Chn Excr Rate: 3262.5 mg/d
Total Protein, Urine: 145.8 mg/dL
Volume, Urine: 2250 mL

## 2013-02-07 LAB — BASIC METABOLIC PANEL
CO2: 24 mEq/L (ref 19–32)
Calcium: 9.3 mg/dL (ref 8.4–10.5)
Creatinine, Ser: 1.36 mg/dL — ABNORMAL HIGH (ref 0.50–1.35)
GFR calc non Af Amer: 64 mL/min — ABNORMAL LOW (ref >90)
Sodium: 133 mEq/L — ABNORMAL LOW (ref 135–145)

## 2013-02-07 LAB — GLUCOSE, CAPILLARY
Glucose-Capillary: 232 mg/dL — ABNORMAL HIGH (ref 70–99)
Glucose-Capillary: 84 mg/dL (ref 70–99)

## 2013-02-07 LAB — BONE MARROW EXAM

## 2013-02-07 LAB — LACTATE DEHYDROGENASE: LDH: 201 U/L (ref 94–250)

## 2013-02-07 MED ORDER — INSULIN ASPART 100 UNIT/ML ~~LOC~~ SOLN: 0.0000 [IU] | Freq: Three times a day (TID) | SUBCUTANEOUS | Status: DC

## 2013-02-07 MED ORDER — FENTANYL CITRATE 0.05 MG/ML IJ SOLN
INTRAMUSCULAR | Status: AC | PRN
  Administered 2013-02-07: 100 ug via INTRAVENOUS

## 2013-02-07 MED ORDER — DEXAMETHASONE SODIUM PHOSPHATE 4 MG/ML IJ SOLN
4.0000 mg | Freq: Two times a day (BID) | INTRAMUSCULAR | Status: DC
  Filled 2013-02-07: qty 1

## 2013-02-07 MED ORDER — DEXAMETHASONE 4 MG PO TABS: 4.0000 mg | ORAL_TABLET | Freq: Two times a day (BID) | ORAL | Status: DC

## 2013-02-07 MED ORDER — DEXAMETHASONE SODIUM PHOSPHATE 10 MG/ML IJ SOLN
4.0000 mg | Freq: Two times a day (BID) | INTRAMUSCULAR | Status: DC
  Filled 2013-02-07: qty 0.4

## 2013-02-07 MED ORDER — MIDAZOLAM HCL 2 MG/2ML IJ SOLN
INTRAMUSCULAR | Status: AC | PRN
  Administered 2013-02-07: 2 mg via INTRAVENOUS

## 2013-02-07 MED ORDER — INSULIN ASPART 100 UNIT/ML ~~LOC~~ SOLN
15.0000 [IU] | Freq: Once | SUBCUTANEOUS | Status: AC
  Administered 2013-02-07: 15 [IU] via SUBCUTANEOUS

## 2013-02-07 NOTE — Progress Notes (Signed)
  Radiation Oncology         ([phone]) [phone] ________________________________  Name: Mark [name] MRN: [id number]  Date: 02/07/2013  DOB: 1971-01-21  Simulation Verification Note  Status: inpatient  NARRATIVE: The patient was brought to the treatment unit and placed in the planned treatment position. The clinical setup was verified. Then port films were obtained and uploaded to the radiation oncology medical record software.  The treatment beams were carefully compared against the planned radiation fields. The position location and shape of the radiation fields was reviewed. They targeted volume of tissue appears to be appropriately covered by the radiation beams. Organs at risk appear to be excluded as planned.  Based on my personal review, I approved the simulation verification. The patient's treatment will proceed as planned.  -----------------------------------  Billie Lade, PhD, MD

## 2013-02-07 NOTE — ED Provider Notes (Signed)
Medical screening examination/treatment/procedure(s) were performed by non-physician practitioner and as supervising physician I was immediately available for consultation/collaboration.    Nelia Shi, MD 02/07/13 [phone]

## 2013-02-07 NOTE — Progress Notes (Signed)
[facility] Radiation Oncology Dept Therapy Treatment Record Phone [phone]   Radiation Therapy was administered to Mark [name] on: 02/07/2013  2:19 PM and was treatment # 1 out of a planned course of 14 treatments.

## 2013-02-07 NOTE — Progress Notes (Signed)
Mark [name] does have myeloma. His bone survey does show multiple lytic lesions. His urine shows 3300 mg per day kappa light chain excretion.  He is going for his bone marrow biopsy today.  He will have a radiation therapy today.  His kidney function is improving. Creatinine is now down to 1.36. His calcium is down to 9.3.  His blood sugars are on the high side. These will clearly need to be watched. He is on Decadron. This is because his blood sugars do not high. We probably can cut back the Decadron dose.  From my point of view, he go home either today or tomorrow. He will need chemotherapy. We start this as an outpatient.  He's not complain of any pain. Again the Decadron is helping with this.  His vital signs are all stable. There is no weakness in his legs. He is ambulating okay. Lungs are clear. Cardiac exam regular rate and rhythm.  Again, [name] does have myeloma. He is light chain myeloma. This does tend be more aggressive in his clinical course. As such, he ultimately will need a bone marrow transplant.  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 The plasma cells are somewhat blastic in nature. I think this correlates fairly well with his overall clinical presentation.  Pete E.  Romans 5:3-5

## 2013-02-07 NOTE — Discharge Summary (Signed)
Physician Discharge Summary  Mark [name] HQI:[id number] DOB: 1970-08-28 DOA: 02/01/2013  PCP: No primary provider on file.  Admit date: 02/01/2013 Discharge date: 02/07/2013  Time spent: 35 minutes  Recommendations for Outpatient Follow-up:  1. Need Bmet to follow calcium level.  2. Need Blood sugar level follow up.  3. Need to follow up with Dr Myna Hidalgo for [facility] results and further treatment for MM.   Discharge Diagnoses:    Multiple Myeloma, light chain.    Hypercalcemia   ARF (acute renal failure)   Hyperglycemia secondary to steroids.   Discharge Condition: Stable.   Diet recommendation: Carb Modified.   Filed Weights   02/01/13 0333  Weight: 87.771 kg (193 lb 8 oz)    History of present illness:  Mark [name] is a 42 y.o. male w/o past medical hx of importance; cqame to ED complaining of nausea, vomiting and abd pain. Patient reports symptoms has been present for the last 6-7 days and appears to be just worsening. Patient was seen on 6/30 with same symptoms and at that time was discharge home with empiric treatment for gastritis. Patient back after failing to improve his symptoms at home; this time blood work demonstrated ARF, hypercalcemia (>15) and abd x-ray/MRI of lumbar spine showed lytic lesions. [facility] called to admit patient for further evaluation and treatment.  Patient denies CP, SOB, cough, HA's, dysuria, hematuria, hematochezia/melena or any other acute complaints.  In ED patient received 1L bolus of IVF and lasix 40mg  iv.   Hospital Course:  1-Hypercalcemia Of malignancy // Multiple myeloma.  Patient presents with elevated calcium, renal failure and lytic lesions on abd x-ray/MRI. High concerns for MM  -phosphorus and TSH WNL,  PTH less than 2.5, Kappa/Lambda light chain elevated at 49. SPEP/UPEP : positive for free Kappa light chains in urine.  -continue decadron as recommended by oncologist  _Status post 4 doses of calcitonin and 1 dose of zometa)  -BM Biopsy today.   -Patient will follow up with Dr Myna Hidalgo for chemotherapy.   2-Lytic lesion of bone on x-ray and 6.4 cm expansile mass of the right L5  pedicle, transverse process, and body  MRI has confirmed lytic lesions and has demonstrated multiple others affecting his spine.  -Patient will start Radiation therapy 7-10.  -biopsy performed on 7/7  -Bone survey: Multiple skeletal lesions are present suspicious for myeloma. These are most prominent in the pelvis and the right L5 vertebral body. No pathologic fracture.   3-Nausea with vomiting: Most likely due to hypercalcemia.  -now tolerating diet.  -Resolved.   4-ARF (acute renal failure): due to presumably MM and dehydration/NSAID's use.  -IV fluids 50 cc/Hr. Creatinine trending down.  -Cr decrease to 1.3. Cr peak to 1.7 during this admission.  -UA w/o signs of infection  -renal US unremarkable  -avoid nephrotoxic agents (especially NSAID's)   5-Hyperglycemia: with hx of first degree relative with DM; no prior hx of DM.  -A1C 5.2  -will monitor as he is on decadron; SSI.  6-Constipation: most likely due to recent use of narcotics and hypercalcemia. Will continue miralax and PRN enemas  DVT: will use heparin   Procedures: Lytic mass biopsy done on 7/7  Bone Marrow Bx 7-10    Consultations:  Dr Myna Hidalgo.   Berdine Dance  Discharge Exam: Filed Vitals:   02/07/13 1022 02/07/13 1025 02/07/13 1027 02/07/13 1031  BP: 128/92 131/83 136/81 139/85  Pulse: 78 77 79 77  Temp:      TempSrc:  Resp: 11 8 8 5   Height:      Weight:      SpO2: 100% 99% 99% 99%    General: No distress.  Cardiovascular: S 1, S 2 RRR Respiratory: CTA  Discharge Instructions  Discharge Orders   Future Appointments Provider Department Dept Phone   02/07/2013 2:00 PM Billie Lade, MD [facility] RAD[facility]COLOGY [phone]   Joint Appt [facility] [place] [facility] RAD[facility] ONCOLOG[facility] [phone]   02/08/2013 3:30 PM  [facility] [place] [facility] RAD[facility] ONCOLOGY [phone]   02/11/2013 3:00 PM [facility] [place] [facility] RADI[facility] ONCOLOGY [phone]   02/12/2013 3:00 PM [facility] [place] [facility] RAD[facility] ONCOLOG[facility] [phone]   02/13/2013 2:50 PM [facility] [place] [facility] RAD[facility] ONCOLOGY [phone]   02/14/2013 3:00 PM [facility] [place] [facility] RADIATION ONCOLOGY [phone]   02/15/2013 3:00 PM [facility] [place] [facility] RADIATION ONCOLOGY [phone]   02/18/2013 3:00 PM [facility] [place] [facility] RADIATION ONCOLOGY [phone]   02/19/2013 3:00 PM [facility] [place] [facility] RAD[facility] ONCOLOG[facility] [phone]   02/20/2013 3:00 PM [facility] [place] [facility] RAD[facility] ONCOLOGY [phone]   02/21/2013 3:00 PM [facility] [place] [facility] RADIATION ONCOLOGY [phone]   02/22/2013 3:00 PM [facility] [place] [facility] RAD[facility] ONCOLOGY [phone]   02/25/2013 3:00 PM [facility] [place] [facility] RADI[facility] ONCOLOGY [phone]   02/26/2013 3:00 PM [facility] RAD[facility]COL[facility] [phone]   Future Orders Complete By Expires     Diet Carb Modified  As directed     Increase activity slowly  As directed         Medication List         cyclobenzaprine 10 MG tablet  Commonly known as:  FLEXERIL  Take 1 tablet (10 mg total) by mouth 2 (two) times daily as needed for muscle spasms.     dexamethasone 4 MG tablet  Commonly known as:  DECADRON  Take 1 tablet (4 mg total) by mouth 2 (two) times daily with a meal.     HYDROcodone-acetaminophen 5-325 MG per tablet  Commonly known as:  NORCO  Take 1 tablet by mouth every 4 (four) hours as needed for pain.     insulin aspart 100 UNIT/ML injection  Commonly known as:  novoLOG  - Inject 0-15 Units into  the skin 3 (three) times daily with meals. Use for CBG 70 to 120 : 0 Units   -                       121 to 150: 2 units  -                        201 to 150 : 5 units   -                        251 to 350 : 8 units     lansoprazole 30 MG capsule  Commonly known as:  PREVACID  Take 1 capsule (30 mg total) by mouth daily.     traMADol 50 MG tablet  Commonly known as:  ULTRAM  Take 1 tablet (50 mg total) by mouth every 6 (six) hours as needed for pain.  No Known Allergies     Follow-up Information   Follow up with [name], MD In 1 week.   Contact information:   [address] [place] [address] [phone]        The results of significant diagnostics from this hospitalization (including imaging, microbiology, ancillary and laboratory) are listed below for reference.    Significant Diagnostic Studies: Dg Chest 2 View  02/01/2013   *RADIOLOGY REPORT*  Clinical Data: Right lower back pain, constipation  CHEST - 2 VIEW  Comparison: 11/02/2012  Findings: Lungs are clear.  No pleural effusion or pneumothorax.  Cardiomediastinal silhouette is within normal limits.  Visualized osseous structures are within normal limits.  IMPRESSION: No evidence of acute cardiopulmonary disease.   Original Report Authenticated By: Charline Bills, M.D.   Dg Lumbar Spine Complete  01/16/2013   *RADIOLOGY REPORT*  Clinical Data: Fall, back pain.  LUMBAR SPINE - COMPLETE 4+ VIEW  Comparison: None.  Findings: There are five lumbar-type vertebral bodies.  No fracture or malalignment.  Disc spaces well maintained.  SI joints are symmetric.  IMPRESSION: No acute bony abnormality.   Original Report Authenticated By: Charlett Nose, M.D.   Mr Lumbar Spine Wo Contrast  02/01/2013   *RADIOLOGY REPORT*  Clinical Data: Low back pain.  Fall.  Lytic lesion in lumbar vertebral pedicle.  MRI LUMBAR SPINE WITHOUT CONTRAST  Technique:  Multiplanar and multiecho pulse sequences of the lumbar  spine were obtained without intravenous contrast.  Comparison: 02/01/2013  Findings: The lytic mass involving the right pedicle, transverse process, and body of L5 measures 6.2 x 6.4 by 4.7 cm and has intermediate to low T1 signal characteristics and high T2 signal characteristics.  A similar signal characteristics are present in a 2.0 by 1.9 cm vertebral body lesion at the T12 level.  There are innumerable speckled T2 signal hyperintense lesions throughout the visualized lumbar spine and upper sacrum measuring between one and 8 mm also with similar imaging characteristics.  Visualized portions the kidneys appear unremarkable and no adenopathy in the visualized portion of the retroperitoneum noted.  No significant vertebral subluxation.  The pedicles in the lumbar spine are mildly congenitally narrow.  I do not see a definite intradural lesion, although IV contrast was not administered.  Additional findings at individual levels are as follows:  L1-2:  No impingement.  L2-3:  Borderline subarticular lateral recess stenosis bilaterally due to mild facet arthropathy.  L3-4:  Mild left and borderline right subarticular lateral recess stenosis due to minimal disc bulge and facet arthropathy.  L4-5:  Poor definition of the right L4 nerve separate from the mass arising from the right L5 vertebra in the lateral extraforaminal space.  The right L5 nerve roots abut the tumor from the expanded right L5 pedicle in the lateral extraforaminal space.  Disc bulge noted.  L5-S1:  The right L5 nerve appears partially encased by the right- sided tumor.  IMPRESSION:  1.  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  Dominant 6.4 cm expansile mass of the right L5 pedicle, transverse process, and body.  This mass at least partially encases the right L5 spinal nerve and abuts the right L4 nerve in the lateral extraforaminal space. 2.  Mild left  subarticular lateral recess stenosis at L3-4 due to disc bulge and facet arthropathy.   Original Report Authenticated By: Gaylyn Rong, M.D.   US Renal  02/02/2013   *RADIOLOGY REPORT*  Clinical Data: Hypercalcemia, question renal lesion, unable to have CT with contrast due to renal dysfunction  RENAL/URINARY TRACT ULTRASOUND COMPLETE  Comparison:  CT abdomen and pelvis 05/24/2010  Findings:  Right Kidney:  13.1 cm length.  Normal cortical thickness and echogenicity.  No mass, hydronephrosis or shadowing calcification. No perinephric fluid.  Left Kidney:  12.2 cm length.  Normal cortical thickness and echogenicity.  Small extrarenal pelvis.  No mass, hydronephrosis or shadowing calcification.  No perinephric fluid.  Bladder:  Normal appearance with a calculated prevoid volume of 160 ml.  IMPRESSION: Unremarkable renal ultrasound.   Original Report Authenticated By: Ulyses Southward, M.D.   Ct Biopsy  02/04/2013   *RADIOLOGY REPORT*  Clinical Data: Multiple bone lesions of the spine with large destructive mass involving the right paraspinous tissues and destroying part of the right L5 vertebral body.  CT GUIDED CORE BIOPSY OF RIGHT PARASPINOUS SOFT TISSUE MASS  Sedation:   2.0 mg IV Versed;  100 mcg IV Fentanyl  Total Moderate Sedation Time: 10 minutes.  Procedure:  The procedure risks, benefits, and alternatives were explained to the patient.  Questions regarding the procedure were encouraged and answered.  The patient understands and consents to the procedure.  The lower lumbar paraspinous region was prepped with Betadine in a sterile fashion, and a sterile drape was applied covering the operative field.  A sterile gown and sterile gloves were used for the procedure.  Local anesthesia was provided with 1% Lidocaine.  CT was performed in a prone position.  A 17 gauge needle was advanced under CT guidance to the level of a right lower lumbar paraspinous mass.  Four separate 18 gauge core biopsy samples were  obtained and submitted in formalin.  Complications: None  Findings: CT shows a large soft tissue mass in the right paraspinous region destroying the L5 vertebral body and measuring up to 6 cm in greatest diameter.  Multiple other smaller lytic lesions are identified in the visualized spine as well as both iliac bones and the sacrum.  Solid tissue was obtained from the paraspinous mass.  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   Original Report Authenticated By: Irish Lack, M.D.   Dg Abd 2 Views  02/01/2013   **ADDENDUM** CREATED: 02/01/2013 11:42:07  Additionally noted is a lytic lesion in the left iliac bone and a healing right lateral 7th rib fracture (likely pathologic).  **END ADDENDUM** SIGNED BY: Charline Bills, M.D.  02/01/2013   *RADIOLOGY REPORT*  Clinical Data: Abdominal pain, constipation  ABDOMEN - 2 VIEW  Comparison: CT abdomen pelvis dated 05/24/2010  Findings: Nonobstructive bowel gas pattern.  Moderate stool in the right colon.  No evidence of free air under the diaphragm on the upright view.  Lytic lesion involving the right pedicle at L5.  IMPRESSION: No evidence of small bowel obstruction or free air.  Moderate stool in the right colon.  Lytic lesion involving the right pedicle at L5.  MRI lumbar spine with/without contrast is suggested for further evaluation.  These results were called by telephone on 02/01/2013 at 2035 hours to Dr. Radford Pax, who verbally acknowledged these results.   Original Report Authenticated By: Charline Bills, M.D.   Dg Bone Survey Met  02/06/2013   *RADIOLOGY REPORT*  Clinical Data: Possible myeloma.  METASTATIC BONE SURVEY  Comparison: CT 02/04/2013  Findings: Recent biopsy of the right paraspinous mass at L5.  There is destruction of the right L5  pedicle and lateral vertebral body compatible with an aggressive process.  In addition there are lytic lesions in the iliac bone bilaterally, best  seen on the CT.  No pathologic fracture is identified.  No skull lesion is identified.  Multiple lytic lesions are seen in the skeleton, suspicious for myeloma.  This includes the spinous process of C6.  There are probable bilateral rib lesions.  There is a possible lesion in the right scapula.  There are small lytic lesions in the right humerus. There is a small lytic lesion in the left intertrochanteric femur. There is a larger slightly expansile lytic lesion in the right mid femur.  IMPRESSION: Multiple skeletal lesions are present suspicious for myeloma. These are most prominent in the pelvis and the right L5 vertebral body.  No pathologic fracture.   Original Report Authenticated By: Janeece Riggers, M.D.    Microbiology: No results found for this or any previous visit (from the past 240 hour(s)).   Labs: Basic Metabolic Panel:  Recent Labs Lab 02/01/13 0415  02/01/13 1230 02/02/13 0515 02/03/13 0514 02/04/13 0505 02/05/13 0410 02/06/13 0505 02/07/13 0450  NA 136  --   --  135 138 135 132* 131* 133*  K 4.1  --   --  4.8 4.8 4.7 4.5 4.4 4.2  CL 97  --   --  97 102 103 101 99 99  CO2 31  --   --  25 27 26 24 24 24   GLUCOSE 166*  --   --  137* 136* 146* 171* 240* 358*  BUN 29*  --   --  30* 36* 33* 37* 33* 28*  CREATININE 1.68*  --   --  1.50* 1.72* 1.55* 1.53* 1.45* 1.36*  CALCIUM >15.0*  < > >15.0* 14.5* 14.9* 13.4* 11.6* 10.6* 9.3  PHOS  --   --  4.4 4.2  --  3.7  --   --   --   < > = values in this interval not displayed. Liver Function Tests:  Recent Labs Lab 02/01/13 0415 02/02/13 0515 02/04/13 0505  AST 20  --   --   ALT 18  --   --   ALKPHOS 85  --   --   BILITOT 0.5  --   --   PROT 7.9  --   --   ALBUMIN 3.9 3.9 3.6    Recent Labs Lab 02/01/13 0415  LIPASE 23   No results found for this basename: AMMONIA,  in the last 168 hours CBC:  Recent Labs Lab 02/01/13 0415 02/04/13 0505 02/06/13 0505  WBC 6.0 11.9* 14.0*  NEUTROABS 4.5  --   --   HGB 13.0 12.7*  12.0*  HCT 37.8* 35.3* 34.3*  MCV 90.4 89.1 87.9  PLT 268 286 325   Cardiac Enzymes: No results found for this basename: CKTOTAL, CKMB, CKMBINDEX, TROPONINI,  in the last 168 hours BNP: BNP (last 3 results) No results found for this basename: PROBNP,  in the last 8760 hours CBG:  Recent Labs Lab 02/06/13 1652 02/07/13 0741  GLUCAP 296* 232*       Signed:  Mykal Kirchman  [facility] 02/07/2013, 10:45 AM

## 2013-02-07 NOTE — Progress Notes (Signed)
Inpatient Diabetes Program Recommendations  AACE/ADA: New Consensus Statement on Inpatient Glycemic Control (2013)  Target Ranges:  Prepandial:   less than 140 mg/dL      Peak postprandial:   less than 180 mg/dL (1-2 hours)      Critically ill patients:  140 - 180 mg/dL   Reason for Visit:   Agree with Novolog correction.  Note that A1C on 02/01/13 was 5.2% indicating that patient does not have diabetes.

## 2013-02-07 NOTE — Care Management Note (Signed)
    Page 1 of 2   02/07/2013     1:52:59 PM   CARE MANAGEMENT NOTE 02/07/2013  Patient:  Mark [name]   Account Number:  [id number]  Date Initiated:  02/01/2013  Documentation initiated by:  DAVIS,TYMEEKA  Subjective/Objective Assessment:   42 yo male admitted with ARf & hypercalcemia. No PCP on record.     Action/Plan:   Home when stable   Anticipated DC Date:  02/07/2013   Anticipated DC Plan:  HOME/SELF CARE      DC Planning Services  CM consult  PCP issues      Choice offered to / List presented to:     DME arranged  NA      DME agency  NA     HH arranged  NA      HH agency  NA   Status of service:  Completed, signed off Medicare Important Message given?   (If response is "NO", the following Medicare IM given date fields will be blank) Date Medicare IM given:   Date Additional Medicare IM given:    Discharge Disposition:  HOME/SELF CARE  Per UR Regulation:  Reviewed for med. necessity/level of care/duration of stay  If discussed at Long Length of Stay Meetings, dates discussed:   02/07/2013    Comments:  02/07/13 Elliotte Marsalis RN,BSN NCM [phone] WILL F/U W/DR. ENNEVER AS PCP.NO ORDERS OR NEEDS.  Sharmon Leyden, RN Registered Nurse Signed CASE MANAGEMENT Care Management Note Service date: 02/01/2013 3:33 PM Cm spoke with patient at bedside with family member present. No PCP on record. Patient provided information concerning [facility] to find providers within insurance network and information concerning [employer]. No other barriers identified at this time.   02/01/13 1351 Tymeeka Davis,RN,BSN [phone] chart reviewed. CM to consult pt for need of PCP establishment prior to discharge. No other needs identified.

## 2013-02-07 NOTE — Procedures (Signed)
Successful RT ILIAC BM ASP AND CORE BX NO COMP STABLE FULL REPORT IN PACS PATH PENDING  

## 2013-02-08 ENCOUNTER — Ambulatory Visit
Admit: 2013-02-08 | Discharge: 2013-02-08 | Disposition: A | Discharge status: Home / Self Care | Payer: BC Managed Care – PPO | Attending: Radiation Oncology | Admitting: Radiation Oncology

## 2013-02-08 ENCOUNTER — Other Ambulatory Visit: Payer: Self-pay | Admitting: Hematology & Oncology

## 2013-02-08 DIAGNOSIS — C9000 Multiple myeloma not having achieved remission: Secondary | ICD-10-CM

## 2013-02-11 ENCOUNTER — Ambulatory Visit
Admission: RE | Admit: 2013-02-11 | Discharge: 2013-02-11 | Disposition: A | Discharge status: Home / Self Care | Payer: BC Managed Care – PPO | Source: Ambulatory Visit | Attending: Radiation Oncology | Admitting: Radiation Oncology

## 2013-02-11 ENCOUNTER — Telehealth: Payer: Self-pay | Admitting: Hematology & Oncology

## 2013-02-11 NOTE — Telephone Encounter (Signed)
Pt aware of 8-6 appointment °

## 2013-02-12 ENCOUNTER — Ambulatory Visit
Admission: RE | Admit: 2013-02-12 | Discharge: 2013-02-12 | Disposition: A | Discharge status: Home / Self Care | Payer: BC Managed Care – PPO | Source: Ambulatory Visit | Attending: Radiation Oncology | Admitting: Radiation Oncology

## 2013-02-12 VITALS — BP 139/70 | HR 91 | Temp 98.1°F | Ht 72.0 in | Wt 197.9 lb

## 2013-02-12 DIAGNOSIS — C9000 Multiple myeloma not having achieved remission: Secondary | ICD-10-CM

## 2013-02-12 NOTE — Progress Notes (Signed)
[name] here with his wife for weekly under treat visit.  He has had 4 fractions to his l spine.  He has pain in his lower back that he rates at an 8/10.  He has not been taking any pain medication because he does not like how it makes him feel.  He does have occasional nausea.  He was given the radiation therapy and you book and discussed the potential side effects of radiation including fatigue, diarrhea, nausea and vomiting and skin changes.  He was advised to contact nursing with any questions or concerns.

## 2013-02-12 NOTE — Progress Notes (Signed)
[facility]    Radiation Oncology [address] [place]     Maryln Gottron, M.D. [place], [place] [address]               Billie Lade, M.D., Ph.D. Phone: [phone]      Molli Hazard A. Kathrynn Running, M.D. Fax: [phone]      Radene Gunning, M.D., Ph.D.         Lurline Hare, M.D.         Grayland Jack, M.D Weekly Treatment Management Note  Name: Mark [name]     MRN: [id number]        CSN: [id number] Date: 02/12/2013      DOB: Nov 05, 1970  CC: No primary provider on file.         No ref. provider found    Status: Outpatient  Diagnosis: The encounter diagnosis was Multiple myeloma.  Current Dose: 10 Gy   Current Fraction: 4  Planned Dose: 35 Gy  Narrative: [name] was seen today for weekly treatment management. The chart was checked and CBCT  were reviewed. He is tolerating his treatments well without any nausea or diarrhea. His pain continues to be quite significant. He does not like to take hydrocodone because the way it makes him feel.  Review of patient's allergies indicates no known allergies. Current Outpatient Prescriptions  Medication Sig Dispense Refill  . dexamethasone (DECADRON) 4 MG tablet Take 1 tablet (4 mg total) by mouth 2 (two) times daily with a meal.  60 tablet  0  . insulin aspart (NOVOLOG) 100 UNIT/ML injection Inject 0-15 Units into the skin 3 (three) times daily with meals. Use for CBG 70 to 120 : 0 Units                        121 to 150: 2 units                        201 to 150 : 5 units                         251 to 350 : 8 units  1 vial  12  . cyclobenzaprine (FLEXERIL) 10 MG tablet Take 1 tablet (10 mg total) by mouth 2 (two) times daily as needed for muscle spasms.  20 tablet  0  . HYDROcodone-acetaminophen (NORCO) 5-325 MG per tablet Take 1 tablet by mouth every 4 (four) hours as needed for pain.  10 tablet  0  . lansoprazole (PREVACID) 30 MG capsule Take 1 capsule (30 mg total) by mouth daily.  30 capsule  0  . traMADol (ULTRAM)  50 MG tablet Take 1 tablet (50 mg total) by mouth every 6 (six) hours as needed for pain.  15 tablet  0   No current facility-administered medications for this encounter.   Labs:  Lab Results  Component Value Date   WBC 14.0* 02/06/2013   HGB 12.0* 02/06/2013   HCT 34.3* 02/06/2013   MCV 87.9 02/06/2013   PLT 325 02/06/2013   Lab Results  Component Value Date   CREATININE 1.36* 02/07/2013   BUN 28* 02/07/2013   NA 133* 02/07/2013   K 4.2 02/07/2013   CL 99 02/07/2013   CO2 24 02/07/2013   Lab Results  Component Value Date   ALT 18 02/01/2013   AST 20 02/01/2013   PHOS 3.7 02/04/2013   BILITOT 0.5 02/01/2013  Physical Examination:  height is 6' (1.829 m) and weight is 197 lb 14.4 oz (89.767 kg). His temperature is 98.1 F (36.7 C). His blood pressure is 139/70 and his pulse is 91.    Wt Readings from Last 3 Encounters:  02/12/13 197 lb 14.4 oz (89.767 kg)  02/01/13 193 lb 8 oz (87.771 kg)  01/16/13 205 lb (92.987 kg)     Lungs - Normal respiratory effort, chest expands symmetrically. Lungs are clear to auscultation, no crackles or wheezes.  Heart has regular rhythm and rate  Abdomen is soft and non tender with normal bowel sounds  Assessment:  Patient tolerating treatments well  Plan: Continue treatment per original radiation prescription

## 2013-02-13 ENCOUNTER — Ambulatory Visit
Admission: RE | Admit: 2013-02-13 | Discharge: 2013-02-13 | Disposition: A | Discharge status: Home / Self Care | Payer: BC Managed Care – PPO | Source: Ambulatory Visit | Attending: Radiation Oncology | Admitting: Radiation Oncology

## 2013-02-14 ENCOUNTER — Ambulatory Visit
Admission: RE | Admit: 2013-02-14 | Discharge: 2013-02-14 | Disposition: A | Discharge status: Home / Self Care | Payer: BC Managed Care – PPO | Source: Ambulatory Visit | Attending: Radiation Oncology | Admitting: Radiation Oncology

## 2013-02-15 ENCOUNTER — Ambulatory Visit
Admission: RE | Admit: 2013-02-15 | Discharge: 2013-02-15 | Disposition: A | Discharge status: Home / Self Care | Payer: BC Managed Care – PPO | Source: Ambulatory Visit | Attending: Radiation Oncology | Admitting: Radiation Oncology

## 2013-02-18 ENCOUNTER — Ambulatory Visit
Admission: RE | Admit: 2013-02-18 | Discharge: 2013-02-18 | Disposition: A | Discharge status: Home / Self Care | Payer: BC Managed Care – PPO | Source: Ambulatory Visit | Attending: Radiation Oncology | Admitting: Radiation Oncology

## 2013-02-19 ENCOUNTER — Encounter: Payer: Self-pay | Admitting: Radiation Oncology

## 2013-02-19 ENCOUNTER — Ambulatory Visit
Admission: RE | Admit: 2013-02-19 | Discharge: 2013-02-19 | Disposition: A | Discharge status: Home / Self Care | Payer: BC Managed Care – PPO | Source: Ambulatory Visit | Attending: Radiation Oncology | Admitting: Radiation Oncology

## 2013-02-19 VITALS — BP 143/87 | HR 105 | Temp 98.4°F | Resp 20 | Wt 187.9 lb

## 2013-02-19 DIAGNOSIS — C9000 Multiple myeloma not having achieved remission: Secondary | ICD-10-CM

## 2013-02-19 NOTE — Progress Notes (Signed)
[facility]    Radiation Oncology [address] [place]     Maryln Gottron, M.D. [place], [place] [address]               Billie Lade, M.D., Ph.D. Phone: [phone]      Molli Hazard A. Kathrynn Running, M.D. Fax: [phone]      Radene Gunning, M.D., Ph.D.         Lurline Hare, M.D.         Grayland Jack, M.D Weekly Treatment Management Note  Name: Mark [name]     MRN: [id number]        CSN: [id number] Date: 02/19/2013      DOB: 1970/12/03  CC: No primary provider on file.         No ref. provider found    Status: Outpatient  Diagnosis: The encounter diagnosis was Multiple myeloma.  Current Dose: 22.5 Gy  Current Fraction: 9  Planned Dose: 35 Gy  Narrative: [name] was seen today for weekly treatment management. The chart was checked and CBCT  were reviewed. He has had some mild nausea with this treatment. His pain is improved slightly. He is not taking any prescription pain medication in light of side effects associated with this medicine.  Review of patient's allergies indicates no known allergies. Current Outpatient Prescriptions  Medication Sig Dispense Refill  . cyclobenzaprine (FLEXERIL) 10 MG tablet Take 1 tablet (10 mg total) by mouth 2 (two) times daily as needed for muscle spasms.  20 tablet  0  . dexamethasone (DECADRON) 4 MG tablet Take 1 tablet (4 mg total) by mouth 2 (two) times daily with a meal.  60 tablet  0  . HYDROcodone-acetaminophen (NORCO) 5-325 MG per tablet Take 1 tablet by mouth every 4 (four) hours as needed for pain.  10 tablet  0  . insulin aspart (NOVOLOG) 100 UNIT/ML injection Inject 0-15 Units into the skin 3 (three) times daily with meals. Use for CBG 70 to 120 : 0 Units                        121 to 150: 2 units                        201 to 150 : 5 units                         251 to 350 : 8 units  1 vial  12  . lansoprazole (PREVACID) 30 MG capsule Take 1 capsule (30 mg total) by mouth daily.  30 capsule  0  . traMADol (ULTRAM)  50 MG tablet Take 1 tablet (50 mg total) by mouth every 6 (six) hours as needed for pain.  15 tablet  0   No current facility-administered medications for this encounter.     Physical Examination:  weight is 187 lb 14.4 oz (85.231 kg). His oral temperature is 98.4 F (36.9 C). His blood pressure is 143/87 and his pulse is 105. His respiration is 20.    Wt Readings from Last 3 Encounters:  02/19/13 187 lb 14.4 oz (85.231 kg)  02/12/13 197 lb 14.4 oz (89.767 kg)  02/01/13 193 lb 8 oz (87.771 kg)     Lungs - Normal respiratory effort, chest expands symmetrically. Lungs are clear to auscultation, no crackles or wheezes.  Heart has regular rhythm and rate  Abdomen is soft and  non tender with normal bowel sounds  Assessment:  Patient tolerating treatments well  Plan: Continue treatment per original radiation prescription

## 2013-02-19 NOTE — Progress Notes (Signed)
Pt c/o pain in his lower back 6-7/10. He states he has not taken any pain meds for this pain or med for muscle spasms. He reports loss of appetite, states he eats 5-6 small meals and snacks daily. He states he has had nausea, no vomiting, denies diarrhea. Pt on Decadron 4 mg bid.

## 2013-02-20 ENCOUNTER — Ambulatory Visit
Admission: RE | Admit: 2013-02-20 | Discharge: 2013-02-20 | Disposition: A | Discharge status: Home / Self Care | Payer: BC Managed Care – PPO | Source: Ambulatory Visit | Attending: Radiation Oncology | Admitting: Radiation Oncology

## 2013-02-20 ENCOUNTER — Encounter: Payer: Self-pay | Admitting: Hematology & Oncology

## 2013-02-20 VITALS — BP 155/86 | HR 101 | Temp 98.5°F | Resp 18

## 2013-02-20 DIAGNOSIS — C9000 Multiple myeloma not having achieved remission: Secondary | ICD-10-CM

## 2013-02-20 NOTE — Addendum Note (Signed)
Encounter addended by: Agnes Lawrence, RN on: 02/20/2013  3:29 PM<BR>     Documentation filed: Chief Complaint Section, Notes Section, Vitals Section

## 2013-02-20 NOTE — Progress Notes (Signed)
Mark [name] wheeled to nursing by linac 1 therapist. Therapist reports Mark [name] was hardly able to ambulate to treatment table. Assessed Mark [name]'s vitals. BP and pulse slightly elevated. Mark [name] unable to keep eyes open. Reports he is very weak. Therapist report he seems confused today. Strongly encouraged Mark [name] to allow Dr. Roselind Messier to evaluate him but, Mark [name] adamantly refused. Tried to convince Mark [name] to allow lab work to be drawn. Mark [name] refused lab work stating he would do it tomorrow. Wheeled Mark [name] to his wife's Mark [name]. Assisted Mark [name] into the [facility]. Explained to Mark [name]'s wife that he refused lab work and to be seen by physician. Advised Mark [name]'s wife to call [phone] if his condition became worse and she verbalized understanding.

## 2013-02-21 ENCOUNTER — Emergency Department (HOSPITAL_COMMUNITY): Payer: BC Managed Care – PPO

## 2013-02-21 ENCOUNTER — Encounter: Payer: Self-pay | Admitting: Hematology & Oncology

## 2013-02-21 ENCOUNTER — Encounter: Payer: Self-pay | Admitting: Radiation Oncology

## 2013-02-21 ENCOUNTER — Encounter (HOSPITAL_COMMUNITY): Payer: Self-pay | Admitting: Emergency Medicine

## 2013-02-21 ENCOUNTER — Inpatient Hospital Stay (HOSPITAL_COMMUNITY)
Admission: EM | Admit: 2013-02-21 | Discharge: 2013-02-26 | DRG: 296 | Disposition: A | Discharge status: Home / Self Care | Payer: BC Managed Care – PPO | Attending: Internal Medicine | Admitting: Internal Medicine

## 2013-02-21 ENCOUNTER — Telehealth: Payer: Self-pay | Admitting: *Deleted

## 2013-02-21 ENCOUNTER — Ambulatory Visit
Admission: RE | Admit: 2013-02-21 | Discharge: 2013-02-21 | Disposition: A | Discharge status: Home / Self Care | Payer: BC Managed Care – PPO | Source: Ambulatory Visit | Attending: Radiation Oncology | Admitting: Radiation Oncology

## 2013-02-21 VITALS — BP 157/86 | HR 89 | Temp 98.9°F | Resp 20 | Wt 187.0 lb

## 2013-02-21 DIAGNOSIS — T380X5A Adverse effect of glucocorticoids and synthetic analogues, initial encounter: Secondary | ICD-10-CM | POA: Diagnosis present

## 2013-02-21 DIAGNOSIS — D630 Anemia in neoplastic disease: Secondary | ICD-10-CM | POA: Diagnosis present

## 2013-02-21 DIAGNOSIS — C9000 Multiple myeloma not having achieved remission: Secondary | ICD-10-CM | POA: Insufficient documentation

## 2013-02-21 DIAGNOSIS — M545 Low back pain, unspecified: Secondary | ICD-10-CM | POA: Diagnosis present

## 2013-02-21 DIAGNOSIS — G8929 Other chronic pain: Secondary | ICD-10-CM | POA: Diagnosis present

## 2013-02-21 DIAGNOSIS — D61818 Other pancytopenia: Secondary | ICD-10-CM | POA: Diagnosis present

## 2013-02-21 DIAGNOSIS — N179 Acute kidney failure, unspecified: Secondary | ICD-10-CM

## 2013-02-21 DIAGNOSIS — D649 Anemia, unspecified: Secondary | ICD-10-CM

## 2013-02-21 DIAGNOSIS — E139 Other specified diabetes mellitus without complications: Secondary | ICD-10-CM | POA: Diagnosis present

## 2013-02-21 DIAGNOSIS — R112 Nausea with vomiting, unspecified: Secondary | ICD-10-CM | POA: Diagnosis present

## 2013-02-21 DIAGNOSIS — M549 Dorsalgia, unspecified: Secondary | ICD-10-CM

## 2013-02-21 DIAGNOSIS — Z794 Long term (current) use of insulin: Secondary | ICD-10-CM

## 2013-02-21 DIAGNOSIS — Z79899 Other long term (current) drug therapy: Secondary | ICD-10-CM | POA: Insufficient documentation

## 2013-02-21 DIAGNOSIS — N182 Chronic kidney disease, stage 2 (mild): Secondary | ICD-10-CM

## 2013-02-21 DIAGNOSIS — E860 Dehydration: Secondary | ICD-10-CM | POA: Diagnosis present

## 2013-02-21 DIAGNOSIS — M899 Disorder of bone, unspecified: Secondary | ICD-10-CM

## 2013-02-21 DIAGNOSIS — K5900 Constipation, unspecified: Secondary | ICD-10-CM | POA: Diagnosis present

## 2013-02-21 DIAGNOSIS — Q998 Other specified chromosome abnormalities: Secondary | ICD-10-CM

## 2013-02-21 DIAGNOSIS — N189 Chronic kidney disease, unspecified: Secondary | ICD-10-CM | POA: Diagnosis present

## 2013-02-21 DIAGNOSIS — T451X5A Adverse effect of antineoplastic and immunosuppressive drugs, initial encounter: Secondary | ICD-10-CM | POA: Diagnosis present

## 2013-02-21 HISTORY — DX: Malignant (primary) neoplasm, unspecified: C80.1

## 2013-02-21 HISTORY — DX: Multiple myeloma not having achieved remission: C90.00

## 2013-02-21 LAB — GLUCOSE, CAPILLARY
Glucose-Capillary: 107 mg/dL — ABNORMAL HIGH (ref 70–99)
Glucose-Capillary: 189 mg/dL — ABNORMAL HIGH (ref 70–99)

## 2013-02-21 LAB — COMPREHENSIVE METABOLIC PANEL
AST: 20 U/L (ref 0–37)
Albumin: 4.2 g/dL (ref 3.5–5.2)
Alkaline Phosphatase: 78 U/L (ref 39–117)
BUN: 20 mg/dL (ref 6–23)
CO2: 33 mEq/L — ABNORMAL HIGH (ref 19–32)
Chloride: 100 mEq/L (ref 96–112)
GFR calc non Af Amer: 63 mL/min — ABNORMAL LOW (ref >90)
Potassium: 3.5 mEq/L (ref 3.5–5.1)
Total Bilirubin: 1 mg/dL (ref 0.3–1.2)

## 2013-02-21 LAB — CBC WITH DIFFERENTIAL/PLATELET
BASO%: 0 % (ref 0.0–2.0)
Basophils Absolute: 0 10*3/uL (ref 0.0–0.1)
Basophils Relative: 0 % (ref 0–1)
EOS%: 1.2 % (ref 0.0–7.0)
Eosinophils Absolute: 0.1 10*3/uL (ref 0.0–0.5)
HCT: 32.2 % — ABNORMAL LOW (ref 39.0–52.0)
Hemoglobin: 10.9 g/dL — ABNORMAL LOW (ref 13.0–17.0)
LYMPH%: 8.6 % — ABNORMAL LOW (ref 14.0–49.0)
Lymphocytes Relative: 4 % — ABNORMAL LOW (ref 12–46)
MCH: 31.6 pg (ref 27.2–33.4)
MCHC: 34 g/dL (ref 32.0–36.0)
MCHC: 33.9 g/dL (ref 30.0–36.0)
MCV: 92.7 fL (ref 79.3–98.0)
MONO%: 7.7 % (ref 0.0–14.0)
Monocytes Relative: 9 % (ref 3–12)
NEUT#: 4.7 10*3/uL (ref 1.5–6.5)
Neutro Abs: 5.2 10*3/uL (ref 1.7–7.7)
Neutrophils Relative %: 87 % — ABNORMAL HIGH (ref 43–77)
Platelets: 169 10*3/uL (ref 140–400)
RBC: 3.58 10*6/uL — ABNORMAL LOW (ref 4.20–5.82)
RDW: 12.6 % (ref 11.0–14.6)
WBC: 6 10*3/uL (ref 4.0–10.5)
nRBC: 0 % (ref 0–0)

## 2013-02-21 LAB — BASIC METABOLIC PANEL (CC13)
CO2: 32 mEq/L — ABNORMAL HIGH (ref 22–29)
Calcium: 17.7 mg/dL (ref 8.4–10.4)
Creatinine: 1.5 mg/dL — ABNORMAL HIGH (ref 0.7–1.3)
Sodium: 145 mEq/L (ref 136–145)

## 2013-02-21 LAB — BASIC METABOLIC PANEL
CO2: 33 mEq/L — ABNORMAL HIGH (ref 19–32)
Calcium: >15 mg/dL (ref 8.4–10.5)
Chloride: 104 mEq/L (ref 96–112)
Glucose, Bld: 134 mg/dL — ABNORMAL HIGH (ref 70–99)
Sodium: 143 mEq/L (ref 135–145)

## 2013-02-21 LAB — APTT: aPTT: 21 seconds — ABNORMAL LOW (ref 24–37)

## 2013-02-21 LAB — URINALYSIS, ROUTINE W REFLEX MICROSCOPIC
Glucose, UA: NEGATIVE mg/dL
Hgb urine dipstick: NEGATIVE
Specific Gravity, Urine: 1.015 (ref 1.005–1.030)
pH: 6.5 (ref 5.0–8.0)

## 2013-02-21 MED ORDER — MORPHINE SULFATE 4 MG/ML IJ SOLN
4.0000 mg | INTRAMUSCULAR | Status: DC | PRN
  Administered 2013-02-21 – 2013-02-22 (×3): 4 mg via INTRAVENOUS
  Filled 2013-02-21 (×3): qty 1

## 2013-02-21 MED ORDER — ONDANSETRON HCL 4 MG/2ML IJ SOLN
4.0000 mg | Freq: Four times a day (QID) | INTRAMUSCULAR | Status: DC | PRN
  Administered 2013-02-22 – 2013-02-24 (×3): 4 mg via INTRAVENOUS
  Filled 2013-02-21 (×3): qty 2

## 2013-02-21 MED ORDER — INSULIN ASPART 100 UNIT/ML ~~LOC~~ SOLN
4.0000 [IU] | Freq: Three times a day (TID) | SUBCUTANEOUS | Status: DC
  Administered 2013-02-21 – 2013-02-26 (×11): 4 [IU] via SUBCUTANEOUS

## 2013-02-21 MED ORDER — SODIUM CHLORIDE 0.9 % IV BOLUS (SEPSIS)
1000.0000 mL | Freq: Once | INTRAVENOUS | Status: AC
  Administered 2013-02-21: 1000 mL via INTRAVENOUS

## 2013-02-21 MED ORDER — SODIUM CHLORIDE 0.9 % IV BOLUS (SEPSIS): 1000.0000 mL | Freq: Once | INTRAVENOUS | Status: DC

## 2013-02-21 MED ORDER — MORPHINE SULFATE 4 MG/ML IJ SOLN
4.0000 mg | Freq: Once | INTRAMUSCULAR | Status: AC
  Administered 2013-02-21: 4 mg via INTRAVENOUS
  Filled 2013-02-21: qty 1

## 2013-02-21 MED ORDER — HYDROCODONE-ACETAMINOPHEN 5-325 MG PO TABS: 1.0000 | ORAL_TABLET | ORAL | Status: DC | PRN

## 2013-02-21 MED ORDER — CALCITONIN (SALMON) 200 UNIT/ML IJ SOLN
500.0000 [IU] | Freq: Three times a day (TID) | INTRAMUSCULAR | Status: AC
  Administered 2013-02-21 – 2013-02-22 (×3): 500 [IU] via SUBCUTANEOUS
  Filled 2013-02-21 (×3): qty 2.5

## 2013-02-21 MED ORDER — SODIUM CHLORIDE 0.9 % IV SOLN
90.0000 mg | Freq: Once | INTRAVENOUS | Status: AC
  Administered 2013-02-21: 90 mg via INTRAVENOUS
  Filled 2013-02-21: qty 10

## 2013-02-21 MED ORDER — HEPARIN SODIUM (PORCINE) 5000 UNIT/ML IJ SOLN
5000.0000 [IU] | Freq: Three times a day (TID) | INTRAMUSCULAR | Status: DC
  Administered 2013-02-21 – 2013-02-25 (×10): 5000 [IU] via SUBCUTANEOUS
  Filled 2013-02-21 (×17): qty 1

## 2013-02-21 MED ORDER — SODIUM CHLORIDE 0.9 % IV SOLN
INTRAVENOUS | Status: DC
  Administered 2013-02-21 – 2013-02-22 (×4): via INTRAVENOUS
  Administered 2013-02-23: 200 mL/h via INTRAVENOUS
  Administered 2013-02-23 – 2013-02-26 (×5): via INTRAVENOUS

## 2013-02-21 MED ORDER — POLYETHYLENE GLYCOL 3350 17 G PO PACK
17.0000 g | PACK | Freq: Every day | ORAL | Status: DC | PRN
  Filled 2013-02-21: qty 1

## 2013-02-21 MED ORDER — ACETAMINOPHEN 500 MG PO TABS: 1000.0000 mg | ORAL_TABLET | Freq: Four times a day (QID) | ORAL | Status: DC | PRN

## 2013-02-21 MED ORDER — ONDANSETRON HCL 4 MG PO TABS: 4.0000 mg | ORAL_TABLET | Freq: Four times a day (QID) | ORAL | Status: DC | PRN

## 2013-02-21 MED ORDER — INSULIN DETEMIR 100 UNIT/ML ~~LOC~~ SOLN
10.0000 [IU] | Freq: Every day | SUBCUTANEOUS | Status: DC
  Administered 2013-02-21: 10 [IU] via SUBCUTANEOUS
  Filled 2013-02-21 (×6): qty 0.1

## 2013-02-21 MED ORDER — INSULIN ASPART 100 UNIT/ML ~~LOC~~ SOLN: 0.0000 [IU] | Freq: Three times a day (TID) | SUBCUTANEOUS | Status: DC

## 2013-02-21 MED ORDER — DEXAMETHASONE 4 MG PO TABS
4.0000 mg | ORAL_TABLET | Freq: Two times a day (BID) | ORAL | Status: DC
  Administered 2013-02-21 – 2013-02-25 (×9): 4 mg via ORAL
  Filled 2013-02-21 (×13): qty 1

## 2013-02-21 MED ORDER — CALCITONIN (SALMON) 200 UNIT/ML IJ SOLN
100.0000 [IU] | Freq: Two times a day (BID) | INTRAMUSCULAR | Status: DC
  Administered 2013-02-21: 100 [IU] via INTRAMUSCULAR
  Filled 2013-02-21 (×2): qty 0.5

## 2013-02-21 MED ORDER — SODIUM CHLORIDE 0.9 % IJ SOLN
3.0000 mL | Freq: Two times a day (BID) | INTRAMUSCULAR | Status: DC
  Administered 2013-02-21 – 2013-02-26 (×4): 3 mL via INTRAVENOUS

## 2013-02-21 MED ORDER — SODIUM CHLORIDE 0.9 % IV SOLN
INTRAVENOUS | Status: AC
  Administered 2013-02-21: 13:00:00 via INTRAVENOUS

## 2013-02-21 MED ORDER — INSULIN ASPART 100 UNIT/ML ~~LOC~~ SOLN: 0.0000 [IU] | Freq: Every day | SUBCUTANEOUS | Status: DC

## 2013-02-21 NOTE — H&P (Signed)
[facility] History and Physical  Mark [name] WUJ:[id number] DOB: [birth year] DOA: 02/21/2013  Referring physician: Cathi [name] PCP: Mark [name].  Specialists: oncology  Chief Complaint: generalized weakness  HPI: Mark [name] is a 42 y.o. male  Past medical history of multiple myeloma, with lytic lesions to his back currently receiving radiation, he has not started chemotherapy. His last treatment of radiation was to date of admission where basic metabolic panel was done and showed a calcium greater than 15 so he was sent to the ED. He has also been feeling very weak, with polyuria and polydipsia which is progressively getting worse. He relates Mark fever, chills or diarrhea. He does relate some nausea but Mark vomiting. He's also feels slow in his thinking. But relates Mark loss of consciousness or falls.  In the ED: A basic metabolic panel was done that shows a calcium greater than 15 , a mild anemia of 10.9, creatinine at baseline 1.3.  Review of Systems: The patient denies anorexia, fever, weight loss,, vision loss, decreased hearing, hoarseness, chest pain, syncope, dyspnea on exertion, peripheral edema, balance deficits, hemoptysis, abdominal pain, melena, hematochezia, severe indigestion/heartburn, hematuria, incontinence, genital sores, muscle weakness, suspicious skin lesions, transient blindness, difficulty walking, depression, unusual weight change, abnormal bleeding, enlarged lymph nodes, angioedema, and breast masses.    Past Medical History  Diagnosis Date  . Cancer   . Multiple myeloma    History reviewed. Mark pertinent past surgical history. Social History:  reports that he has quit smoking. His smoking use included Cigarettes. He smoked 1.00 pack per day. He has never used smokeless tobacco. He reports that he does not drink alcohol or use illicit drugs.   Allergies  Allergen Reactions  . Ibuprofen Nausea Only    Family History  Problem Relation Age  of Onset  . Diabetic kidney disease Mother   . Hypertension Mother   . Heart attack Mother   . Kidney failure Mother   . Coronary artery disease Mother   . HIV Father    Prior to Admission medications   Medication Sig Start Date End Date Taking? Authorizing Provider  acetaminophen (TYLENOL) 500 MG tablet Take 1,000 mg by mouth every 6 (six) hours as needed for pain.   Yes Historical Provider, MD  cyclobenzaprine (FLEXERIL) 10 MG tablet Take 1 tablet (10 mg total) by mouth 2 (two) times daily as needed for muscle spasms. 01/16/13  Yes Gilda Crease, MD  dexamethasone (DECADRON) 4 MG tablet Take 1 tablet (4 mg total) by mouth 2 (two) times daily with a meal. 02/07/13  Yes Belkys A Regalado, MD  HYDROcodone-acetaminophen (NORCO) 5-325 MG per tablet Take 1 tablet by mouth every 4 (four) hours as needed for pain. 01/28/13  Yes Loren Racer, MD  insulin aspart (NOVOLOG) 100 UNIT/ML injection Inject 0-15 Units into the skin 3 (three) times daily with meals. Use for CBG 70 to 120 : 0 Units                        121 to 150: 2 units                        201 to 150 : 5 units                         251 to 350 : 8 units 02/07/13  Yes Alba Cory, MD   Physical Exam: Ceasar Mons  Vitals:   02/21/13 1037  BP: 159/96  Pulse: 100  Temp: 99.4 F (37.4 C)  TempSrc: Oral  Resp: 21  SpO2: 100%    BP 159/96  Pulse 100  Temp(Src) 99.4 F (37.4 C) (Oral)  Resp 21  SpO2 100%  General Appearance:    Alert, cooperative, Mark distress, appears stated age              Throat:   Lips, mucosa, and tongue are dry   Neck:   Supple, symmetrical, trachea midline, Mark adenopathy;       thyroid:  Mark enlargement/tenderness/nodules; Mark carotid   bruit or JVD  Back:     Symmetric, Mark curvature, ROM normal, Mark CVA tenderness  Lungs:     Clear to auscultation bilaterally, respirations unlabored  Chest wall:    Mark tenderness or deformity  Heart:    Regular rate and rhythm, S1 and S2 normal, Mark murmur,  rub   or gallop  Abdomen:     Soft, non-tender, bowel sounds active all four quadrants,    Mark masses, Mark organomegaly        Extremities:   Extremities normal, atraumatic, Mark cyanosis or edema  Pulses:   2+ and symmetric all extremities  Skin:   Skin color, texture, turgor normal, Mark rashes or lesions  Lymph nodes:   Cervical, supraclavicular, and axillary nodes normal  Neurologic:   CNII-XII intact. Normal strength, sensation and hyperreflexia   3+      Labs on Admission:  Basic Metabolic Panel:  Recent Labs Lab 02/21/13 0908 02/21/13 1136  NA 145 140  K 3.8 3.5  CL  --  100  CO2 32* 33*  GLUCOSE 115 104*  BUN 19.5 20  CREATININE 1.5* 1.38*  CALCIUM 17.7 Repeated and Verified* >15.0*   Liver Function Tests:  Recent Labs Lab 02/21/13 1136  AST 20  ALT 26  ALKPHOS 78  BILITOT 1.0  PROT 7.6  ALBUMIN 4.2   Mark results found for this basename: LIPASE, AMYLASE,  in the last 168 hours Mark results found for this basename: AMMONIA,  in the last 168 hours CBC:  Recent Labs Lab 02/21/13 0907 02/21/13 1136  WBC 5.7 6.0  NEUTROABS 4.7 5.2  HGB 11.3* 10.9*  HCT 33.2* 32.2*  MCV 92.7 92.8  PLT 169 169   Cardiac Enzymes:  Recent Labs Lab 02/21/13 1136  TROPONINI <0.30    BNP (last 3 results) Mark results found for this basename: PROBNP,  in the last 8760 hours CBG: Mark results found for this basename: GLUCAP,  in the last 168 hours  Radiological Exams on Admission: Dg Chest 2 View  02/21/2013   *RADIOLOGY REPORT*  Clinical Data: Back pain and weakness  CHEST - 2 VIEW  Comparison: 02/01/2013  Findings: The heart and pulmonary vascularity are within normal limits.  The lungs are clear bilaterally.  Mark acute bony abnormality is seen.  Some old rib fractures are noted on the right.  IMPRESSION: Mark acute abnormality noted   Original Report Authenticated By: Alcide Clever, M.D.    EKG: Independently reviewed. Sinus rhythm with a short QT, normal axis Mark T wave  abnormalities.  Assessment/Plan  Hypercalcemia: - This most likely due to multiple myeloma. We'll go ahead and admit him to telemetry floor. We'll start him on aggressive IV fluids, strict I.'s and O.'s., Starting calcitonin. Monitor strict I.'s and O.'s. - Check a basic metabolic panel in the morning. - We'll start him additionally on by  Zolendronic acid. - consult oncology to start chemotherapy. Zofran for nausea. - last BM several days ago miralax.  Multiple myeloma - Consult Oncology, he was to start chemotherapy today.  Code Status: full Family Communication: wife Disposition Plan: inpatient  Time spent: 75 minutes  Marinda Elk [facility] Pager [phone]  If 7PM-7AM, please contact night-coverage [email or web address] Password [facility] 02/21/2013, 1:27 PM

## 2013-02-21 NOTE — Telephone Encounter (Signed)
CALLED PATIENT TO INFORM OF LAB  APPT FOR TODAY, LVM FOR A RETURN CALL

## 2013-02-21 NOTE — ED Provider Notes (Signed)
History    CSN: [id number] Arrival date & time 02/21/13  1034  First MD Initiated Contact with Patient 02/21/13 1051     Chief Complaint  Patient presents with  . Back Pain   (Consider location/radiation/quality/duration/timing/severity/associated sxs/prior Treatment) HPI Pt with history of multiple myeloma currently undergoing radiation on his back present with lethargy, fatigue and bl side/flank pain. No fever, CP, SOB, cough. Had calcium level checked and was >17. Instructed to come to the ED to be admitted.  Past Medical History  Diagnosis Date  . Cancer   . Multiple myeloma    History reviewed. No pertinent past surgical history. Family History  Problem Relation Age of Onset  . Diabetic kidney disease Mother   . Hypertension Mother   . Heart attack Mother   . Kidney failure Mother   . Coronary artery disease Mother   . HIV Father    History  Substance Use Topics  . Smoking status: Never Smoker   . Smokeless tobacco: Never Used  . Alcohol Use: No    Review of Systems  Constitutional: Positive for chills and fatigue. Negative for fever.  HENT: Negative for neck pain and neck stiffness.   Respiratory: Negative for cough and shortness of breath.   Cardiovascular: Negative for chest pain, palpitations and leg swelling.  Gastrointestinal: Positive for nausea. Negative for vomiting, abdominal pain, diarrhea and constipation.  Genitourinary: Negative for dysuria and frequency.  Musculoskeletal: Positive for myalgias, back pain and arthralgias.  Skin: Negative for rash and wound.  Neurological: Positive for weakness. Negative for dizziness, light-headedness, numbness and headaches.  All other systems reviewed and are negative.    Allergies  Ibuprofen  Home Medications   Current Outpatient Rx  Name  Route  Sig  Dispense  Refill  . acetaminophen (TYLENOL) 500 MG tablet   Oral   Take 1,000 mg by mouth every 6 (six) hours as needed for pain.         .  cyclobenzaprine (FLEXERIL) 10 MG tablet   Oral   Take 1 tablet (10 mg total) by mouth 2 (two) times daily as needed for muscle spasms.   20 tablet   0   . dexamethasone (DECADRON) 4 MG tablet   Oral   Take 1 tablet (4 mg total) by mouth 2 (two) times daily with a meal.   60 tablet   0   . HYDROcodone-acetaminophen (NORCO) 5-325 MG per tablet   Oral   Take 1 tablet by mouth every 4 (four) hours as needed for pain.   10 tablet   0   . insulin aspart (NOVOLOG) 100 UNIT/ML injection   Subcutaneous   Inject 0-15 Units into the skin 3 (three) times daily with meals. Use for CBG 70 to 120 : 0 Units                        121 to 150: 2 units                        201 to 150 : 5 units                         251 to 350 : 8 units   1 vial   12    BP 159/96  Pulse 100  Temp(Src) 99.4 F (37.4 C) (Oral)  Resp 21  SpO2 100% Physical Exam  Nursing note and vitals  reviewed. Constitutional: He is oriented to person, place, and time. He appears well-developed and well-nourished. No distress.  Mild lethargy  HENT:  Head: Normocephalic and atraumatic.  Mouth/Throat: Oropharynx is clear and moist.  Eyes: EOM are normal. Pupils are equal, round, and reactive to light.  Neck: Normal range of motion. Neck supple.  Cardiovascular: Normal rate and regular rhythm.   Pulmonary/Chest: Effort normal and breath sounds normal. No respiratory distress. He has no wheezes. He has no rales. He exhibits no tenderness.  Abdominal: Soft. Bowel sounds are normal. He exhibits no distension and no mass. There is no tenderness. There is no rebound and no guarding.  Musculoskeletal: Normal range of motion. He exhibits tenderness (bl flank tenderness to mild palpation). He exhibits no edema.  Neurological: He is oriented to person, place, and time.  Drowsy appearing, 4/5 motor in all ext, sensation grosslly intact  Skin: Skin is warm and dry. No rash noted. No erythema.    ED Course  Procedures (including  critical care time) Labs Reviewed  CBC WITH DIFFERENTIAL - Abnormal; Notable for the following:    RBC 3.47 (*)    Hemoglobin 10.9 (*)    HCT 32.2 (*)    Neutrophils Relative % 87 (*)    Lymphocytes Relative 4 (*)    Lymphs Abs 0.2 (*)    All other components within normal limits  COMPREHENSIVE METABOLIC PANEL - Abnormal; Notable for the following:    CO2 33 (*)    Glucose, Bld 104 (*)    Creatinine, Ser 1.38 (*)    Calcium >15.0 (*)    GFR calc non Af Amer 63 (*)    GFR calc Af Amer 73 (*)    All other components within normal limits  URINALYSIS, ROUTINE W REFLEX MICROSCOPIC - Abnormal; Notable for the following:    APPearance CLOUDY (*)    All other components within normal limits  APTT - Abnormal; Notable for the following:    aPTT 21 (*)    All other components within normal limits  TROPONIN I  PROTIME-INR   Dg Chest 2 View  02/21/2013   *RADIOLOGY REPORT*  Clinical Data: Back pain and weakness  CHEST - 2 VIEW  Comparison: 02/01/2013  Findings: The heart and pulmonary vascularity are within normal limits.  The lungs are clear bilaterally.  No acute bony abnormality is seen.  Some old rib fractures are noted on the right.  IMPRESSION: No acute abnormality noted   Original Report Authenticated By: Alcide Clever, M.D.   1. Hypercalcemia   2. Back pain     Date: 02/21/2013  Rate: 89  Rhythm: normal sinus rhythm  QRS Axis: normal  Intervals: normal  ST/T Wave abnormalities: ST elevations diffusely  Conduction Disutrbances:none  Narrative Interpretation:   Old EKG Reviewed: unchanged Diffuse, mild ST elevation similar to prev EKG 02/01/13   MDM  Discussed with Dr Robb Matar. Will admit pt.   Loren Racer, MD 02/21/13 [phone]

## 2013-02-21 NOTE — ED Notes (Signed)
Bed given @ 1326 pm

## 2013-02-21 NOTE — Progress Notes (Signed)
Pt was listed in EPIC with generic worker compensation coverage CM spoke with pt to inquire about pcp but informed by male family member at bedside that the pt has blue cross and blue shield coverage She confirmed pt did not have a pcp Cm informed her she would request ED registration staff to come to update pt's EPIC information Cm spoke with Madelaine Bhat in ED registration to have insurance information updated in [facility]

## 2013-02-21 NOTE — ED Notes (Signed)
Ranae Palms, MD, made aware of calcium level.

## 2013-02-21 NOTE — Progress Notes (Signed)
Utilization Review completed.  December Hedtke RN CM  

## 2013-02-21 NOTE — Progress Notes (Signed)
[facility]    Radiation Oncology [address] [place]     Maryln Gottron, M.D. [place], [place] [address]               Billie Lade, M.D., Ph.D. Phone: [phone]      Molli Hazard A. Kathrynn Running, M.D. Fax: [phone]      Radene Gunning, M.D., Ph.D.         Lurline Hare, M.D.         Grayland Jack, M.D Weekly Treatment Management Note  Name: Mark [name]     MRN: [id number]        CSN: [id number] Date: 02/21/2013      DOB: January 03, 1971  CC: No primary provider on file.         No ref. provider found    Status: Outpatient  Diagnosis: The encounter diagnosis was Multiple myeloma.  Current Dose: 27.5 Gy  Current Fraction: 11 of 14 planned  Planned Dose: 35 Gy  Narrative: Mark [name] was seen today for weekly treatment management. The chart was checked and CBCT  were reviewed. The patient is been feeling poorly with a lot of fatigue and weakness. I recommended the obtained labs yesterday afternoon after his appointment. He did not wish to stay for this but agreed to come in earlier this morning prior to his treatment.  He continues to have little improvement in his pain.  Review of patient's allergies indicates no known allergies. Current Outpatient Prescriptions  Medication Sig Dispense Refill  . cyclobenzaprine (FLEXERIL) 10 MG tablet Take 1 tablet (10 mg total) by mouth 2 (two) times daily as needed for muscle spasms.  20 tablet  0  . dexamethasone (DECADRON) 4 MG tablet Take 1 tablet (4 mg total) by mouth 2 (two) times daily with a meal.  60 tablet  0  . HYDROcodone-acetaminophen (NORCO) 5-325 MG per tablet Take 1 tablet by mouth every 4 (four) hours as needed for pain.  10 tablet  0  . insulin aspart (NOVOLOG) 100 UNIT/ML injection Inject 0-15 Units into the skin 3 (three) times daily with meals. Use for CBG 70 to 120 : 0 Units                        121 to 150: 2 units                        201 to 150 : 5 units                         251 to 350 : 8 units  1  vial  12  . lansoprazole (PREVACID) 30 MG capsule Take 1 capsule (30 mg total) by mouth daily.  30 capsule  0  . traMADol (ULTRAM) 50 MG tablet Take 1 tablet (50 mg total) by mouth every 6 (six) hours as needed for pain.  15 tablet  0   No current facility-administered medications for this encounter.   Labs:  Lab Results  Component Value Date   WBC 5.7 02/21/2013   HGB 11.3* 02/21/2013   HCT 33.2* 02/21/2013   MCV 92.7 02/21/2013   PLT 169 02/21/2013   Lab Results  Component Value Date   CREATININE 1.5* 02/21/2013   BUN 19.5 02/21/2013   NA 145 02/21/2013   K 3.8 02/21/2013   CL 99 02/07/2013   CO2 32* 02/21/2013   Lab Results  Component Value Date   ALT 18 02/01/2013   AST 20 02/01/2013   PHOS 3.7 02/04/2013   BILITOT 0.5 02/01/2013    Physical Examination:  weight is 187 lb (84.823 kg). His oral temperature is 98.9 F (37.2 C). His blood pressure is 157/86 and his pulse is 89. His respiration is 20 and oxygen saturation is 100%.    Wt Readings from Last 3 Encounters:  02/21/13 187 lb (84.823 kg)  02/19/13 187 lb 14.4 oz (85.231 kg)  02/12/13 197 lb 14.4 oz (89.767 kg)     Lungs - Normal respiratory effort, chest expands symmetrically. Lungs are clear to auscultation, no crackles or wheezes.  Heart has regular rhythm and rate  Abdomen is soft and non tender with normal bowel sounds  Assessment:  Patient tolerating treatments well.  Issues as above  Plan: Continue treatment per original radiation prescription.  The patient's calcium is 17.7. He will be admitted in light of this issue.  I did speak with Dr. Arlan Organ his medical oncologist and the patient will likely start chemotherapy tomorrow as an inpatient.

## 2013-02-21 NOTE — ED Notes (Signed)
Patient with history of multiple myeloma cancer in the lower back reports that after receiving radiation today his calcium level was 17 and so his doctor sent him to the ED to be admitted. Patient state pain of 8/10 in lower back.

## 2013-02-21 NOTE — Progress Notes (Signed)
Pt in nursing per Dr Roselind Messier following labs and radiation tx today. Pt c/o ongoing fatigue,  Nausea, pain in his left lower back and side and his right side. He rates pain as 7-8/10, last took Hydrocodone 5 days ago. Pt states he is going to begin taking Hydrocodone more often. He denies loss of appetite. Pt does not have antiemetic med, requests prescription from [facility], [address]

## 2013-02-21 NOTE — Progress Notes (Signed)
CRITICAL VALUE ALERT  Critical value received:  Calcium>15  Date of notification:  02/21/13  Time of notification:  2110  Critical value read back:yes Nurse who received alert:  Karie Schwalbe MD notified (1st page): yes  Time of first page:  2130 MD notified (2nd page):  Time of second page:  Responding MD:  Time MD responded:

## 2013-02-21 NOTE — Telephone Encounter (Signed)
Spoke w/Diane RN, [facility] and informed her that per Dr Roselind Messier pt to be admitted to [facility] through the ED under Dr Gustavo Lah care. Informed her reason for admit is pt's calcium level 17.7 today. Diane states pt to go to triage area. Scott, transporter informed, and Scott transported pt to ED per wheelchair, wife at pt's side.

## 2013-02-22 ENCOUNTER — Ambulatory Visit
Admission: RE | Admit: 2013-02-22 | Discharge: 2013-02-22 | Disposition: A | Discharge status: Home / Self Care | Payer: BC Managed Care – PPO | Source: Ambulatory Visit | Attending: Radiation Oncology | Admitting: Radiation Oncology

## 2013-02-22 ENCOUNTER — Inpatient Hospital Stay (HOSPITAL_COMMUNITY): Payer: BC Managed Care – PPO

## 2013-02-22 ENCOUNTER — Telehealth: Payer: Self-pay | Admitting: *Deleted

## 2013-02-22 DIAGNOSIS — K5900 Constipation, unspecified: Secondary | ICD-10-CM | POA: Diagnosis present

## 2013-02-22 DIAGNOSIS — E860 Dehydration: Secondary | ICD-10-CM

## 2013-02-22 DIAGNOSIS — D649 Anemia, unspecified: Secondary | ICD-10-CM | POA: Diagnosis present

## 2013-02-22 DIAGNOSIS — N179 Acute kidney failure, unspecified: Secondary | ICD-10-CM | POA: Diagnosis present

## 2013-02-22 LAB — CBC
MCHC: 33.7 g/dL (ref 30.0–36.0)
Platelets: 146 10*3/uL — ABNORMAL LOW (ref 150–400)
RDW: 12.5 % (ref 11.5–15.5)
WBC: 5.1 10*3/uL (ref 4.0–10.5)

## 2013-02-22 LAB — COMPREHENSIVE METABOLIC PANEL
ALT: 23 U/L (ref 0–53)
Alkaline Phosphatase: 73 U/L (ref 39–117)
BUN: 20 mg/dL (ref 6–23)
CO2: 33 mEq/L — ABNORMAL HIGH (ref 19–32)
Chloride: 106 mEq/L (ref 96–112)
GFR calc Af Amer: 65 mL/min — ABNORMAL LOW (ref >90)
GFR calc non Af Amer: 56 mL/min — ABNORMAL LOW (ref >90)
Glucose, Bld: 132 mg/dL — ABNORMAL HIGH (ref 70–99)
Potassium: 4 mEq/L (ref 3.5–5.1)
Sodium: 143 mEq/L (ref 135–145)
Total Bilirubin: 1 mg/dL (ref 0.3–1.2)
Total Protein: 6.9 g/dL (ref 6.0–8.3)

## 2013-02-22 LAB — GLUCOSE, CAPILLARY
Glucose-Capillary: 123 mg/dL — ABNORMAL HIGH (ref 70–99)
Glucose-Capillary: 117 mg/dL — ABNORMAL HIGH (ref 70–99)

## 2013-02-22 MED ORDER — FAMCICLOVIR 500 MG PO TABS
250.0000 mg | ORAL_TABLET | Freq: Every day | ORAL | Status: DC
  Administered 2013-02-22 – 2013-02-25 (×4): 250 mg via ORAL
  Filled 2013-02-22 (×6): qty 0.5

## 2013-02-22 MED ORDER — MIDAZOLAM HCL 2 MG/2ML IJ SOLN
INTRAMUSCULAR | Status: AC
  Filled 2013-02-22: qty 4

## 2013-02-22 MED ORDER — BISACODYL 10 MG RE SUPP
10.0000 mg | Freq: Every day | RECTAL | Status: DC | PRN
  Administered 2013-02-24: 10 mg via RECTAL
  Filled 2013-02-22 (×2): qty 1

## 2013-02-22 MED ORDER — MIDAZOLAM HCL 2 MG/2ML IJ SOLN
INTRAMUSCULAR | Status: AC | PRN
  Administered 2013-02-22 (×3): 1 mg via INTRAVENOUS

## 2013-02-22 MED ORDER — CEFAZOLIN SODIUM-DEXTROSE 2-3 GM-% IV SOLR
2.0000 g | INTRAVENOUS | Status: AC
  Filled 2013-02-22 (×2): qty 50

## 2013-02-22 MED ORDER — HEPARIN SOD (PORK) LOCK FLUSH 100 UNIT/ML IV SOLN
INTRAVENOUS | Status: AC | PRN
  Administered 2013-02-22: 500 [IU]

## 2013-02-22 MED ORDER — MIDAZOLAM HCL 2 MG/2ML IJ SOLN
INTRAMUSCULAR | Status: AC
  Filled 2013-02-22: qty 2

## 2013-02-22 MED ORDER — FENTANYL CITRATE 0.05 MG/ML IJ SOLN
INTRAMUSCULAR | Status: AC
  Filled 2013-02-22: qty 2

## 2013-02-22 MED ORDER — FENTANYL CITRATE 0.05 MG/ML IJ SOLN
INTRAMUSCULAR | Status: AC | PRN
  Administered 2013-02-22: 100 ug via INTRAVENOUS
  Administered 2013-02-22: 50 ug via INTRAVENOUS

## 2013-02-22 MED ORDER — CEFAZOLIN SODIUM-DEXTROSE 2-3 GM-% IV SOLR
INTRAVENOUS | Status: AC
  Filled 2013-02-22: qty 50

## 2013-02-22 MED ORDER — LIDOCAINE HCL 1 % IJ SOLN
INTRAMUSCULAR | Status: AC
  Filled 2013-02-22: qty 20

## 2013-02-22 MED ORDER — HYDROCODONE-ACETAMINOPHEN 5-325 MG PO TABS
1.0000 | ORAL_TABLET | ORAL | Status: DC | PRN
  Administered 2013-02-24: 1 via ORAL
  Filled 2013-02-22: qty 1

## 2013-02-22 MED ORDER — FENTANYL CITRATE 0.05 MG/ML IJ SOLN
INTRAMUSCULAR | Status: AC
  Filled 2013-02-22: qty 4

## 2013-02-22 MED ORDER — POLYETHYLENE GLYCOL 3350 17 G PO PACK
17.0000 g | PACK | Freq: Every day | ORAL | Status: DC
  Administered 2013-02-23: 17 g via ORAL
  Filled 2013-02-22 (×5): qty 1

## 2013-02-22 MED ORDER — SENNA 8.6 MG PO TABS
2.0000 | ORAL_TABLET | Freq: Every day | ORAL | Status: DC
  Administered 2013-02-24 – 2013-02-25 (×2): 17.2 mg via ORAL
  Filled 2013-02-22 (×5): qty 2

## 2013-02-22 MED ORDER — CEFAZOLIN SODIUM-DEXTROSE 2-3 GM-% IV SOLR
2.0000 g | INTRAVENOUS | Status: AC
  Administered 2013-02-22: 2 g via INTRAVENOUS

## 2013-02-22 MED ORDER — ACETAMINOPHEN 325 MG PO TABS: 650.0000 mg | ORAL_TABLET | Freq: Four times a day (QID) | ORAL | Status: DC | PRN

## 2013-02-22 NOTE — Consult Note (Signed)
#   [id number] is consult note.  Pete E.  Hebrews 12:12

## 2013-02-22 NOTE — Procedures (Signed)
Procedure:  Porta-cath Access:  Right IJ vein Single lumen port with catheter tip at cavoatrial junction.  Accessed and ready to use.  No PTX.

## 2013-02-22 NOTE — Progress Notes (Addendum)
[facility] PROGRESS NOTE  Mark [name] ZOX:[id number] DOB: 06-22-1971 DOA: 02/21/2013 PCP: No primary provider on file.  Brief narrative 42 year old male with history of multiple myeloma (right lumbosacral mass confirmed as plasmacytoma), on radiation therapy to the lumbosacral mass, admitted to the [facility] on 02/21/2013 with complaints of generalized weakness, polyuria and polydipsia. In the ED, serum calcium 17.7, hemoglobin 10.9 and creatinine 1.3. Hospitalist admission was requested.  Assessment/Plan: 1. Hypercalcemia: Most likely secondary to multiple myeloma. Patient clinically still appears dehydrated. Increase normal saline infusion. Patient status post zoledronic acid x1 and calcitonin x3. Continue steroids. Oncology input appreciated and plan to start chemotherapy tomorrow. Hypercalcemia improving. Follow daily BMP. 2. Multiple myeloma: Oncology input appreciated. Plan for Port-A-Cath placement today and start chemotherapy on 7/26. 3. 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 Follow CBCs. 4. Dehydration: secondary to #1. IVF. 5. Constipation: Secondary to hypercalcemia and pain medications. Bowel regimen. 6. Stage II chronic kidney disease: Creatinine probably at baseline. Follow CBC daily.  7. Chronic low back pain/lumbosacral mass: No neurological deficits. Pain management and continued radiation treatment. 8. Steroid induced Hyperglycemia: last A1C: 5.2. Continue current insulins: Levemir, Novolog meal & SSI. Good IP control.  Code Status: Full Family Communication: None Disposition Plan: Home when medically stable.   Consultants:  Oncology  Procedures:  None  Antibiotics:  None   HPI/Subjective: Feels better. Low back pain: 5/10 in severity without radiation-better controlled with pain medications. Constipation-last BM approximately 7 days ago. Passing flatus. Polyuria and polydipsia slightly better. Feels stronger   Objective: Filed Vitals:    02/21/13 1759 02/21/13 2100 02/22/13 0453 02/22/13 1403  BP: 159/82 151/85 153/97 126/79  Pulse:  98 93 91  Temp:  98.2 F (36.8 C) 98.2 F (36.8 C) 98.3 F (36.8 C)  TempSrc:  Oral Oral Oral  Resp:  18 20 20   Height:      Weight:      SpO2:  98% 100% 99%    Intake/Output Summary (Last 24 hours) at 02/22/13 1444 Last data filed at 02/22/13 0908  Gross per 24 hour  Intake    480 ml  Output    400 ml  Net     80 ml   Filed Weights   02/21/13 1513  Weight: 84.7 kg (186 lb 11.7 oz)    Exam:   General exam: Comfortable.Sitting at edge of bed. Mucosa still dry.   Respiratory system: Clear. No increased work of breathing.  Cardiovascular system: S1 & S2 heard, RRR. No JVD, murmurs, gallops, clicks or pedal edema.Telemetry: Sinus rhythm in the 90s-sinus tachycardia in the 100s.   Gastrointestinal system: Abdomen is nondistended, soft and nontender. Normal bowel sounds heard.  Central nervous system: Alert and oriented. No focal neurological deficits.  Extremities: Symmetric 5 x 5 power.   Data Reviewed: Basic Metabolic Panel:  Recent Labs Lab 02/21/13 0908 02/21/13 1136 02/21/13 2031 02/22/13 0448  NA 145 140 143 143  K 3.8 3.5 3.9 4.0  CL  --  100 104 106  CO2 32* 33* 33* 33*  GLUCOSE 115 104* 134* 132*  BUN 19.5 20 19 20   CREATININE 1.5* 1.38* 1.41* 1.51*  CALCIUM 17.7 Repeated and Verified* >15.0* >15.0* 14.8*   Liver Function Tests:  Recent Labs Lab 02/21/13 1136 02/22/13 0448  AST 20 17  ALT 26 23  ALKPHOS 78 73  BILITOT 1.0 1.0  PROT 7.6 6.9  ALBUMIN 4.2 3.7   No results found for this basename: LIPASE, AMYLASE,  in the last  168 hours No results found for this basename: AMMONIA,  in the last 168 hours CBC:  Recent Labs Lab 02/21/13 0907 02/21/13 1136 02/22/13 0448  WBC 5.7 6.0 5.1  NEUTROABS 4.7 5.2  --   HGB 11.3* 10.9* 10.1*  HCT 33.2* 32.2* 30.0*  MCV 92.7 92.8 94.0  PLT 169 169 146*   Cardiac Enzymes:  Recent Labs Lab  02/21/13 1136  TROPONINI <0.30   BNP (last 3 results) No results found for this basename: PROBNP,  in the last 8760 hours CBG:  Recent Labs Lab 02/21/13 1634 02/21/13 2114 02/21/13 2205 02/22/13 0729 02/22/13 1146  GLUCAP 107* 123* 189* 118* 117*    No results found for this or any previous visit (from the past 240 hour(s)).   Studies: Dg Chest 2 View  02/21/2013   *RADIOLOGY REPORT*  Clinical Data: Back pain and weakness  CHEST - 2 VIEW  Comparison: 02/01/2013  Findings: The heart and pulmonary vascularity are within normal limits.  The lungs are clear bilaterally.  No acute bony abnormality is seen.  Some old rib fractures are noted on the right.  IMPRESSION: No acute abnormality noted   Original Report Authenticated By: Alcide Clever, M.D.     Additional labs:   Scheduled Meds: . calcitonin  500 Units Subcutaneous Q8H  .  ceFAZolin (ANCEF) IV  2 g Intravenous 30 min Pre-Op  . dexamethasone  4 mg Oral BID WC  . famciclovir  250 mg Oral Daily  . heparin  5,000 Units Subcutaneous Q8H  . insulin aspart  0-15 Units Subcutaneous TID WC  . insulin aspart  0-5 Units Subcutaneous QHS  . insulin aspart  4 Units Subcutaneous TID WC  . insulin detemir  10 Units Subcutaneous QHS  . polyethylene glycol  17 g Oral Daily  . senna  2 tablet Oral Daily  . sodium chloride  3 mL Intravenous Q12H   Continuous Infusions: . sodium chloride 200 mL/hr (02/22/13 1102)    Active Problems:   Hypercalcemia   Multiple myeloma    Time spent: 45 minutes.    [facility]  [facility] Pager [phone].   If 8PM-8AM, please contact night-coverage at [email or web address], password [facility] 02/22/2013, 2:44 PM  LOS: 1 day

## 2013-02-22 NOTE — Consult Note (Signed)
NAMEJEMARION, Mark [name]                 ACCOUNT NO.:  [id number]  MEDICAL RECORD NO.:  [id number]  LOCATION:  1435                         FACILITY:  [facility]  PHYSICIAN:  Mark [name], M.D.  DATE OF BIRTH:  [birth year]  DATE OF CONSULTATION:  02/22/2013 DATE OF DISCHARGE:                                CONSULTATION   REASON FOR CONSULTATION: 1. Recurrent hypercalcemia. 2. Kappa light chain myeloma.  HISTORY OF PRESENT ILLNESS:  Mark [name] is a nice 42 year old African American gentleman.  I initially saw him back on February 01, 2013.  At that point in time, he presented with a right lumbosacral mass.  This was biopsied.  It was found to be a plasmacytoma.  Workup subsequently showed myeloma.  He had a bone marrow biopsy done.  This showed 16% plasma cells.  He had urine and blood studies done.  He was found have a kappa light chain in his urine.  He had a cytogenetics and FISH studies on his marrow.  He had multiple chromosomal abnormalities.  He has been under Mark [name] management for radiation therapy.  He is having no radiation therapy to this lumbosacral mass.  He had 2 more treatments left.  He was found to have a calcium, I think it is 18 yesterday.  He subsequently was admitted.  He said he was not feeling too well.  He just was weak.  He is nauseated.  He had no energy.  There is no bleeding.  He had no fever.  He is urinating okay.  He is constipated otherwise.  There is no leg swelling.  He was subsequently admitted.  On the day of admission, his BUN and creatinine were 19.5 and 1.5.  Glucose was 115.  Sodium 145.  His CBC showed a white cell count 5.7, hemoglobin 11.3, hematocrit 33.2, platelet count 169,000.  He was started on IV fluids.  He also got a dose of Aredia.  He was also placed on some calcitonin.  Chest x-ray when he came in was unremarkable.  I have seen him so that we can initiate systemic therapy for the myeloma.  PAST MEDICAL HISTORY:   Unremarkable.  ALLERGIES:  To nonsteroidals.  MEDICINE ON ADMISSION:  Flexeril 10 mg p.o. b.i.d. p.r.n., Decadron 4 mg p.o. b.i.d., Vicodin (5/325) 1 p.o. q.4 h. p.r.n., Tylenol as needed.  SOCIAL HISTORY:  Negative for tobacco or alcohol use.  He has no obvious occupational exposures.  FAMILY HISTORY:  Noncontributory.  REVIEW OF SYSTEMS:  As stated in history of present illness.  PHYSICAL EXAMINATION:  GENERAL:  This is a well-developed, well- nourished African American gentleman, in no obvious distress. VITAL SIGNS:  Temperature 98.2, pulse 93, respiratory rate 20, blood pressure 153/97. HEAD AND NECK:  No ocular or oral lesions.  There are no palpable cervical or supraclavicular lymph nodes. LUNGS:  Clear bilaterally. CARDIAC:  Regular rate and rhythm with normal S1 and S2.  There are no murmurs, rubs, or bruits. ABDOMEN:  Soft.  He has decreased bowel sounds.  There is no guarding or rebound tenderness.  There is no palpable hepatosplenomegaly. EXTREMITIES:  No clubbing, cyanosis, or edema.  He has good  range of motion of his joints.  He has good strength bilaterally. SKIN:  No rashes, ecchymosis or petechia. NEUROLOGICAL:  No focal neurological deficits.  LABORATORY STUDIES:  White cell count 5.1, hemoglobin 10.1, hematocrit 30, platelet count 146.  Sodium 143, potassium 4, BUN 20, creatinine 1.5.  Calcium 14.8 with an albumin of 3.7.  Total protein 6.9.  IMPRESSION:  Mark [name] is a 42 year old gentleman with kappa light chain myeloma.  He has recurrent hypercalcemia.  It is absolutely amazing how high his calcium gets and yet he really does not have other symptoms.  Regards, the hypercalcemia is clearly assigned as myeloma is active.  We definitely need to get started on systemic therapy for the myeloma.  He will need to have a Port-A-Cath placed.  Again, he has multiple chromosomal abnormalities.  This is an ominous prognostic finding.  He clearly is going to need a  stem cell transplant if we get him into a very good remission.  He will need to have a Port-A-Cath placed.  We will try to get this done today.  I will utilize Cytoxan/Velcade/Decadron.  I think this would be appropriate for him.  We will try to get this started over the weekend. This will be done if his Port-A-Cath can be placed.  His calcium is already responding to the interventions taken by the hospitalist.  We will see how his calcium continues to trend downward.  I talked to him this morning about everything.  He understood the reason why we need to start treatment on him.  We will see if Mark [name] can talk to him about chemotherapy side effects and give him some information.  We will certainly follow along closely.  Hopefully, he can be moved down to the 3rd floor to start treatment. His cardiac monitor has been okay, so I think that he probably taken off monitor.     Mark [name], M.D.     PRE/MEDQ  D:  02/22/2013  T:  02/22/2013  Job:  [id number]

## 2013-02-22 NOTE — Telephone Encounter (Signed)
Received call from [facility] for pt in rm 1435. She states pt will have port placed today at 2 pm, needs to have radiation treatment time adjusted. Notified RT on linac #1 to call Pattricia Boss RN @ [phone] to schedule pt for earlier treatment time today. RT verbalized understanding.

## 2013-02-22 NOTE — Progress Notes (Signed)
Patient ID: Mark [name], male   DOB: Mar 05, 1971, 42 y.o.   MRN: [id number] Request received for port a cath placement for chemotherapy in pt with recently diagnosed multiple myeloma. Additional PMH as below. Exam: pt awake/alert; chest- CTA bilat; heart- RRR; abd- soft,+BS,NT; ext - FROM, no edema.   Filed Vitals:   02/21/13 1513 02/21/13 1759 02/21/13 2100 02/22/13 0453  BP: 152/94 159/82 151/85 153/97  Pulse: 94  98 93  Temp: 99 F (37.2 C)  98.2 F (36.8 C) 98.2 F (36.8 C)  TempSrc: Oral  Oral Oral  Resp: 18  18 20   Height: 5\' 11"  (1.803 m)     Weight: 186 lb 11.7 oz (84.7 kg)     SpO2: 100%  98% 100%   Past Medical History  Diagnosis Date  . Cancer   . Multiple myeloma    History reviewed. No pertinent past surgical history. Dg Chest 2 View  02/21/2013   *RADIOLOGY REPORT*  Clinical Data: Back pain and weakness  CHEST - 2 VIEW  Comparison: 02/01/2013  Findings: The heart and pulmonary vascularity are within normal limits.  The lungs are clear bilaterally.  No acute bony abnormality is seen.  Some old rib fractures are noted on the right.  IMPRESSION: No acute abnormality noted   Original Report Authenticated By: Alcide Clever, M.D.   Dg Chest 2 View  02/01/2013   *RADIOLOGY REPORT*  Clinical Data: Right lower back pain, constipation  CHEST - 2 VIEW  Comparison: 11/02/2012  Findings: Lungs are clear.  No pleural effusion or pneumothorax.  Cardiomediastinal silhouette is within normal limits.  Visualized osseous structures are within normal limits.  IMPRESSION: No evidence of acute cardiopulmonary disease.   Original Report Authenticated By: Charline Bills, M.D.   Mr Lumbar Spine Wo Contrast  02/01/2013   *RADIOLOGY REPORT*  Clinical Data: Low back pain.  Fall.  Lytic lesion in lumbar vertebral pedicle.  MRI LUMBAR SPINE WITHOUT CONTRAST  Technique:  Multiplanar and multiecho pulse sequences of the lumbar spine were obtained without intravenous contrast.  Comparison: 02/01/2013   Findings: The lytic mass involving the right pedicle, transverse process, and body of L5 measures 6.2 x 6.4 by 4.7 cm and has intermediate to low T1 signal characteristics and high T2 signal characteristics.  A similar signal characteristics are present in a 2.0 by 1.9 cm vertebral body lesion at the T12 level.  There are innumerable speckled T2 signal hyperintense lesions throughout the visualized lumbar spine and upper sacrum measuring between one and 8 mm also with similar imaging characteristics.  Visualized portions the kidneys appear unremarkable and no adenopathy in the visualized portion of the retroperitoneum noted.  No significant vertebral subluxation.  The pedicles in the lumbar spine are mildly congenitally narrow.  I do not see a definite intradural lesion, although IV contrast was not administered.  Additional findings at individual levels are as follows:  L1-2:  No impingement.  L2-3:  Borderline subarticular lateral recess stenosis bilaterally due to mild facet arthropathy.  L3-4:  Mild left and borderline right subarticular lateral recess stenosis due to minimal disc bulge and facet arthropathy.  L4-5:  Poor definition of the right L4 nerve separate from the mass arising from the right L5 vertebra in the lateral extraforaminal space.  The right L5 nerve roots abut the tumor from the expanded right L5 pedicle in the lateral extraforaminal space.  Disc bulge noted.  L5-S1:  The right L5 nerve appears partially encased by the right- sided tumor.  IMPRESSION:  1.  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  Dominant 6.4 cm expansile mass of the right L5 pedicle, transverse process, and body.  This mass at least partially encases the right L5 spinal nerve and abuts the right L4 nerve in the lateral extraforaminal space. 2.  Mild left subarticular lateral recess stenosis at L3-4 due to disc bulge and facet  arthropathy.   Original Report Authenticated By: Gaylyn Rong, M.D.   US Renal  02/02/2013   *RADIOLOGY REPORT*  Clinical Data: Hypercalcemia, question renal lesion, unable to have CT with contrast due to renal dysfunction  RENAL/URINARY TRACT ULTRASOUND COMPLETE  Comparison:  CT abdomen and pelvis 05/24/2010  Findings:  Right Kidney:  13.1 cm length.  Normal cortical thickness and echogenicity.  No mass, hydronephrosis or shadowing calcification. No perinephric fluid.  Left Kidney:  12.2 cm length.  Normal cortical thickness and echogenicity.  Small extrarenal pelvis.  No mass, hydronephrosis or shadowing calcification.  No perinephric fluid.  Bladder:  Normal appearance with a calculated prevoid volume of 160 ml.  IMPRESSION: Unremarkable renal ultrasound.   Original Report Authenticated By: Ulyses Southward, M.D.   Ct Biopsy  02/07/2013   *RADIOLOGY REPORT*  Clinical Data:  Myeloma  CT GUIDED RIGHT ILIAC BONE MARROW ASPIRATION AND CORE BIOPSY  Date:  02/07/2013 07:05:00  Radiologist:  M. Ruel Favors, M.D.  Medications:  2 mg Versed, 100 mcg Fentanyl  Guidance:  CT  Sedation time:  10 minutes  Contrast volume:  None.  Complications:  No immediate  PROCEDURE/FINDINGS:  Informed consent was obtained from the patient following explanation of the procedure, risks, benefits and alternatives. The patient understands, agrees and consents for the procedure. All questions were addressed.  A time out was performed.  The patient was positioned prone and noncontrast localization CT was performed of the pelvis to demonstrate the iliac marrow spaces.  Maximal barrier sterile technique utilized including caps, mask, sterile gowns, sterile gloves, large sterile drape, hand hygiene, and betadine prep.  Under sterile conditions and local anesthesia, an 11 gauge coaxial bone biopsy needle was advanced into the right iliac marrow space. Needle position was confirmed with CT imaging. Initially, bone marrow aspiration was  performed. Next, the 11 gauge outer cannula was utilized to obtain a right iliac bone marrow core biopsy. Needle was removed. Hemostasis was obtained with compression. The patient tolerated the procedure well. Samples were prepared with the cytotechnologist. No immediate complications.  IMPRESSION: CT guided right iliac bone marrow aspiration and core biopsy.   Original Report Authenticated By: Judie Petit. Miles Costain, M.D.   Ct Biopsy  02/04/2013   *RADIOLOGY REPORT*  Clinical Data: Multiple bone lesions of the spine with large destructive mass involving the right paraspinous tissues and destroying part of the right L5 vertebral body.  CT GUIDED CORE BIOPSY OF RIGHT PARASPINOUS SOFT TISSUE MASS  Sedation:   2.0 mg IV Versed;  100 mcg IV Fentanyl  Total Moderate Sedation Time: 10 minutes.  Procedure:  The procedure risks, benefits, and alternatives were explained to the patient.  Questions regarding the procedure were encouraged and answered.  The patient understands and consents to the procedure.  The lower lumbar paraspinous region was prepped with Betadine in a sterile fashion, and a sterile drape was applied covering the operative field.  A sterile gown and sterile gloves were used for the procedure.  Local anesthesia was provided with 1% Lidocaine.  CT was performed in  a prone position.  A 17 gauge needle was advanced under CT guidance to the level of a right lower lumbar paraspinous mass.  Four separate 18 gauge core biopsy samples were obtained and submitted in formalin.  Complications: None  Findings: CT shows a large soft tissue mass in the right paraspinous region destroying the L5 vertebral body and measuring up to 6 cm in greatest diameter.  Multiple other smaller lytic lesions are identified in the visualized spine as well as both iliac bones and the sacrum.  Solid tissue was obtained from the paraspinous mass.  IMPRESSION: CT guided core biopsy performed of a right lower lumbar paraspinous mass centered at the L5  level.   Original Report Authenticated By: Irish Lack, M.D.   Dg Abd 2 Views  02/01/2013   **ADDENDUM** CREATED: 02/01/2013 11:42:07  Additionally noted is a lytic lesion in the left iliac bone and a healing right lateral 7th rib fracture (likely pathologic).  **END ADDENDUM** SIGNED BY: Charline Bills, M.D.  02/01/2013   *RADIOLOGY REPORT*  Clinical Data: Abdominal pain, constipation  ABDOMEN - 2 VIEW  Comparison: CT abdomen pelvis dated 05/24/2010  Findings: Nonobstructive bowel gas pattern.  Moderate stool in the right colon.  No evidence of free air under the diaphragm on the upright view.  Lytic lesion involving the right pedicle at L5.  IMPRESSION: No evidence of small bowel obstruction or free air.  Moderate stool in the right colon.  Lytic lesion involving the right pedicle at L5.  MRI lumbar spine with/without contrast is suggested for further evaluation.  These results were called by telephone on 02/01/2013 at 2035 hours to Dr. Radford Pax, who verbally acknowledged these results.   Original Report Authenticated By: Charline Bills, M.D.   Dg Bone Survey Met  02/06/2013   *RADIOLOGY REPORT*  Clinical Data: Possible myeloma.  METASTATIC BONE SURVEY  Comparison: CT 02/04/2013  Findings: Recent biopsy of the right paraspinous mass at L5.  There is destruction of the right L5 pedicle and lateral vertebral body compatible with an aggressive process.  In addition there are lytic lesions in the iliac bone bilaterally, best seen on the CT.  No pathologic fracture is identified.  No skull lesion is identified.  Multiple lytic lesions are seen in the skeleton, suspicious for myeloma.  This includes the spinous process of C6.  There are probable bilateral rib lesions.  There is a possible lesion in the right scapula.  There are small lytic lesions in the right humerus. There is a small lytic lesion in the left intertrochanteric femur. There is a larger slightly expansile lytic lesion in the right mid femur.   IMPRESSION: Multiple skeletal lesions are present suspicious for myeloma. These are most prominent in the pelvis and the right L5 vertebral body.  No pathologic fracture.   Original Report Authenticated By: Janeece Riggers, M.D.  Results for orders placed during the hospital encounter of 02/21/13  CBC WITH DIFFERENTIAL      Result Value Range   WBC 6.0  4.0 - 10.5 K/uL   RBC 3.47 (*) 4.22 - 5.81 MIL/uL   Hemoglobin 10.9 (*) 13.0 - 17.0 g/dL   HCT 78.2 (*) 95.6 - 21.3 %   MCV 92.8  78.0 - 100.0 fL   MCH 31.4  26.0 - 34.0 pg   MCHC 33.9  30.0 - 36.0 g/dL   RDW 08.6  57.8 - 46.9 %   Platelets 169  150 - 400 K/uL   Neutrophils Relative % 87 (*)  43 - 77 %   Neutro Abs 5.2  1.7 - 7.7 K/uL   Lymphocytes Relative 4 (*) 12 - 46 %   Lymphs Abs 0.2 (*) 0.7 - 4.0 K/uL   Monocytes Relative 9  3 - 12 %   Monocytes Absolute 0.5  0.1 - 1.0 K/uL   Eosinophils Relative 1  0 - 5 %   Eosinophils Absolute 0.1  0.0 - 0.7 K/uL   Basophils Relative 0  0 - 1 %   Basophils Absolute 0.0  0.0 - 0.1 K/uL  COMPREHENSIVE METABOLIC PANEL      Result Value Range   Sodium 140  135 - 145 mEq/L   Potassium 3.5  3.5 - 5.1 mEq/L   Chloride 100  96 - 112 mEq/L   CO2 33 (*) 19 - 32 mEq/L   Glucose, Bld 104 (*) 70 - 99 mg/dL   BUN 20  6 - 23 mg/dL   Creatinine, Ser 1.61 (*) 0.50 - 1.35 mg/dL   Calcium >09.6 (*) 8.4 - 10.5 mg/dL   Total Protein 7.6  6.0 - 8.3 g/dL   Albumin 4.2  3.5 - 5.2 g/dL   AST 20  0 - 37 U/L   ALT 26  0 - 53 U/L   Alkaline Phosphatase 78  39 - 117 U/L   Total Bilirubin 1.0  0.3 - 1.2 mg/dL   GFR calc non Af Amer 63 (*) >90 mL/min   GFR calc Af Amer 73 (*) >90 mL/min  TROPONIN I      Result Value Range   Troponin I <0.30  <0.30 ng/mL  PROTIME-INR      Result Value Range   Prothrombin Time 11.6  11.6 - 15.2 seconds   INR 0.86  0.00 - 1.49  URINALYSIS, ROUTINE W REFLEX MICROSCOPIC      Result Value Range   Color, Urine YELLOW  YELLOW   APPearance CLOUDY (*) CLEAR   Specific Gravity, Urine  1.015  1.005 - 1.030   pH 6.5  5.0 - 8.0   Glucose, UA NEGATIVE  NEGATIVE mg/dL   Hgb urine dipstick NEGATIVE  NEGATIVE   Bilirubin Urine NEGATIVE  NEGATIVE   Ketones, ur NEGATIVE  NEGATIVE mg/dL   Protein, ur NEGATIVE  NEGATIVE mg/dL   Urobilinogen, UA 0.2  0.0 - 1.0 mg/dL   Nitrite NEGATIVE  NEGATIVE   Leukocytes, UA NEGATIVE  NEGATIVE  APTT      Result Value Range   aPTT 21 (*) 24 - 37 seconds  BASIC METABOLIC PANEL      Result Value Range   Sodium 143  135 - 145 mEq/L   Potassium 3.9  3.5 - 5.1 mEq/L   Chloride 104  96 - 112 mEq/L   CO2 33 (*) 19 - 32 mEq/L   Glucose, Bld 134 (*) 70 - 99 mg/dL   BUN 19  6 - 23 mg/dL   Creatinine, Ser 0.45 (*) 0.50 - 1.35 mg/dL   Calcium >40.9 (*) 8.4 - 10.5 mg/dL   GFR calc non Af Amer 61 (*) >90 mL/min   GFR calc Af Amer 71 (*) >90 mL/min  COMPREHENSIVE METABOLIC PANEL      Result Value Range   Sodium 143  135 - 145 mEq/L   Potassium 4.0  3.5 - 5.1 mEq/L   Chloride 106  96 - 112 mEq/L   CO2 33 (*) 19 - 32 mEq/L   Glucose, Bld 132 (*) 70 - 99 mg/dL   BUN  20  6 - 23 mg/dL   Creatinine, Ser 1.61 (*) 0.50 - 1.35 mg/dL   Calcium 09.6 (*) 8.4 - 10.5 mg/dL   Total Protein 6.9  6.0 - 8.3 g/dL   Albumin 3.7  3.5 - 5.2 g/dL   AST 17  0 - 37 U/L   ALT 23  0 - 53 U/L   Alkaline Phosphatase 73  39 - 117 U/L   Total Bilirubin 1.0  0.3 - 1.2 mg/dL   GFR calc non Af Amer 56 (*) >90 mL/min   GFR calc Af Amer 65 (*) >90 mL/min  CBC      Result Value Range   WBC 5.1  4.0 - 10.5 K/uL   RBC 3.19 (*) 4.22 - 5.81 MIL/uL   Hemoglobin 10.1 (*) 13.0 - 17.0 g/dL   HCT 04.5 (*) 40.9 - 81.1 %   MCV 94.0  78.0 - 100.0 fL   MCH 31.7  26.0 - 34.0 pg   MCHC 33.7  30.0 - 36.0 g/dL   RDW 91.4  78.2 - 95.6 %   Platelets 146 (*) 150 - 400 K/uL  GLUCOSE, CAPILLARY      Result Value Range   Glucose-Capillary 107 (*) 70 - 99 mg/dL  GLUCOSE, CAPILLARY      Result Value Range   Glucose-Capillary 189 (*) 70 - 99 mg/dL   Comment 1 Notify RN    GLUCOSE,  CAPILLARY      Result Value Range   Glucose-Capillary 123 (*) 70 - 99 mg/dL  GLUCOSE, CAPILLARY      Result Value Range   Glucose-Capillary 118 (*) 70 - 99 mg/dL   A/P: Pt with multiple myeloma. Plan is for port a cath placement today for chemotherapy. Details/risks of procedure d/w pt/wife with their understanding and consent.

## 2013-02-22 NOTE — Progress Notes (Signed)
CRITICAL VALUE ALERT  Critical value received:  Calcium 14.8 Date of notification:  02/22/13  Time of notification:  0555 Critical value read back:yes Nurse who received alert: Karie Schwalbe  MD notified (1st page):yes   Time of first page:  0608 MD notified (2nd page):  Time of second page:  Responding MD:   Time MD responded:  [phone]

## 2013-02-22 NOTE — Progress Notes (Signed)
Agree 

## 2013-02-23 DIAGNOSIS — Z5111 Encounter for antineoplastic chemotherapy: Secondary | ICD-10-CM

## 2013-02-23 DIAGNOSIS — K5909 Other constipation: Secondary | ICD-10-CM

## 2013-02-23 LAB — CBC
HCT: 25.2 % — ABNORMAL LOW (ref 39.0–52.0)
MCHC: 33.7 g/dL (ref 30.0–36.0)
MCV: 92.6 fL (ref 78.0–100.0)
Platelets: 135 10*3/uL — ABNORMAL LOW (ref 150–400)
RDW: 12.5 % (ref 11.5–15.5)
WBC: 3.8 10*3/uL — ABNORMAL LOW (ref 4.0–10.5)

## 2013-02-23 LAB — COMPREHENSIVE METABOLIC PANEL
ALT: 16 U/L (ref 0–53)
Albumin: 3 g/dL — ABNORMAL LOW (ref 3.5–5.2)
Alkaline Phosphatase: 62 U/L (ref 39–117)
BUN: 20 mg/dL (ref 6–23)
Chloride: 108 mEq/L (ref 96–112)
Glucose, Bld: 107 mg/dL — ABNORMAL HIGH (ref 70–99)
Potassium: 4 mEq/L (ref 3.5–5.1)
Sodium: 142 mEq/L (ref 135–145)
Total Bilirubin: 0.6 mg/dL (ref 0.3–1.2)

## 2013-02-23 LAB — IGG, IGA, IGM
IgA: 64 mg/dL — ABNORMAL LOW (ref 68–379)
IgM, Serum: 15 mg/dL — ABNORMAL LOW (ref 41–251)

## 2013-02-23 MED ORDER — MORPHINE SULFATE 2 MG/ML IJ SOLN
2.0000 mg | INTRAMUSCULAR | Status: DC | PRN
  Administered 2013-02-23 – 2013-02-25 (×3): 2 mg via INTRAVENOUS
  Filled 2013-02-23 (×3): qty 1

## 2013-02-23 MED ORDER — PROCHLORPERAZINE MALEATE 10 MG PO TABS: 10.0000 mg | ORAL_TABLET | Freq: Four times a day (QID) | ORAL | Status: DC | PRN

## 2013-02-23 MED ORDER — HEPARIN SOD (PORK) LOCK FLUSH 100 UNIT/ML IV SOLN: 250.0000 [IU] | Freq: Once | INTRAVENOUS | Status: AC | PRN

## 2013-02-23 MED ORDER — SODIUM CHLORIDE 0.9 % IV SOLN
300.0000 mg/m2 | Freq: Once | INTRAVENOUS | Status: AC
  Administered 2013-02-23: 620 mg via INTRAVENOUS
  Filled 2013-02-23: qty 31

## 2013-02-23 MED ORDER — SODIUM CHLORIDE 0.9 % IV SOLN: Freq: Once | INTRAVENOUS | Status: DC

## 2013-02-23 MED ORDER — ONDANSETRON HCL 8 MG PO TABS: 8.0000 mg | ORAL_TABLET | Freq: Two times a day (BID) | ORAL | Status: DC | PRN

## 2013-02-23 MED ORDER — LORAZEPAM 2 MG/ML IJ SOLN
0.5000 mg | Freq: Once | INTRAMUSCULAR | Status: AC
  Administered 2013-02-23: 0.5 mg via INTRAVENOUS
  Filled 2013-02-23: qty 1

## 2013-02-23 MED ORDER — ALTEPLASE 2 MG IJ SOLR
2.0000 mg | Freq: Once | INTRAMUSCULAR | Status: AC | PRN
  Filled 2013-02-23: qty 2

## 2013-02-23 MED ORDER — HEPARIN SOD (PORK) LOCK FLUSH 100 UNIT/ML IV SOLN: 500.0000 [IU] | Freq: Once | INTRAVENOUS | Status: AC | PRN

## 2013-02-23 MED ORDER — TRAMADOL HCL 50 MG PO TABS: 50.0000 mg | ORAL_TABLET | Freq: Four times a day (QID) | ORAL | Status: DC | PRN

## 2013-02-23 MED ORDER — LORAZEPAM 0.5 MG PO TABS: 0.5000 mg | ORAL_TABLET | Freq: Four times a day (QID) | ORAL | Status: DC | PRN

## 2013-02-23 MED ORDER — INSULIN ASPART 100 UNIT/ML ~~LOC~~ SOLN
0.0000 [IU] | Freq: Three times a day (TID) | SUBCUTANEOUS | Status: DC
  Administered 2013-02-23: 1 [IU] via SUBCUTANEOUS
  Administered 2013-02-25 (×2): 2 [IU] via SUBCUTANEOUS

## 2013-02-23 MED ORDER — SODIUM CHLORIDE 0.9 % IJ SOLN
10.0000 mL | INTRAMUSCULAR | Status: DC | PRN
  Administered 2013-02-26: 10 mL

## 2013-02-23 MED ORDER — SODIUM CHLORIDE 0.9 % IV SOLN
Freq: Once | INTRAVENOUS | Status: AC
  Administered 2013-02-23: 8 mg via INTRAVENOUS
  Filled 2013-02-23: qty 4

## 2013-02-23 MED ORDER — INSULIN ASPART 100 UNIT/ML ~~LOC~~ SOLN: 0.0000 [IU] | Freq: Every day | SUBCUTANEOUS | Status: DC

## 2013-02-23 MED ORDER — POLYETHYLENE GLYCOL 3350 17 G PO PACK: 17.0000 g | PACK | Freq: Every day | ORAL | Status: DC

## 2013-02-23 MED ORDER — BORTEZOMIB CHEMO SQ INJECTION 3.5 MG (2.5MG/ML)
1.3000 mg/m2 | Freq: Once | INTRAMUSCULAR | Status: AC
  Administered 2013-02-23: 2.75 mg via SUBCUTANEOUS
  Filled 2013-02-23: qty 1.1

## 2013-02-23 MED ORDER — SODIUM CHLORIDE 0.9 % IJ SOLN: 3.0000 mL | INTRAMUSCULAR | Status: DC | PRN

## 2013-02-23 MED ORDER — DOCUSATE SODIUM 50 MG PO CAPS
50.0000 mg | ORAL_CAPSULE | Freq: Four times a day (QID) | ORAL | Status: DC | PRN
  Filled 2013-02-23: qty 1

## 2013-02-23 NOTE — Progress Notes (Signed)
Daniele Yankowski   DOB:1971-07-24   WU#:[id number]   CSN#:[id number]  Subjective: uncomfortable in bed, afraid to move because of the port; constipated; pain is "not bad, but it's there." Family in room   Objective: young African American male examined in bed Filed Vitals:   02/23/13 0640  BP: 124/77  Pulse: 92  Temp: 99 F (37.2 C)  Resp: 20    Body mass index is 25.93 kg/(m^2).  Intake/Output Summary (Last 24 hours) at 02/23/13 0745 Last data filed at 02/23/13 0200  Gross per 24 hour  Intake    480 ml  Output    850 ml  Net   -370 ml     Sclerae unicteric  Oropharynx clear  Lungs clear -- no rales or rhonchi  Heart regular rate and rhythm  Abdomen benign  MSK no focal spinal tenderness, no peripheral edema  Neuro nonfocal   CBG (last 3)   Recent Labs  02/22/13 1725 02/22/13 2118 02/23/13 0741  GLUCAP 106* 113* 87     Labs:   ANC 5.2 on 02/21/2013  Lab Results  Component Value Date   WBC 3.8* 02/23/2013   HGB 8.5* 02/23/2013   HCT 25.2* 02/23/2013   MCV 92.6 02/23/2013   PLT 135* 02/23/2013   NEUTROABS 5.2 02/21/2013    @LASTCHEMISTRY @  Urine Studies No results found for this basename: UACOL, UAPR, USPG, UPH, UTP, UGL, UKET, UBIL, UHGB, UNIT, UROB, ULEU, UEPI, UWBC, URBC, UBAC, CAST, CRYS, UCOM, BILUA,  in the last 72 hours  Basic Metabolic Panel:  Recent Labs Lab 02/21/13 0908  02/21/13 1136 02/21/13 2031 02/22/13 0448 02/23/13 0520  NA 145  --  140 143 143 142  K 3.8  < > 3.5 3.9 4.0 4.0  CL  --   --  100 104 106 108  CO2 32*  --  33* 33* 33* 28  GLUCOSE 115  --  104* 134* 132* 107*  BUN 19.5  --  20 19 20 20   CREATININE 1.5*  --  1.38* 1.41* 1.51* 1.37*  CALCIUM 17.7 Repeated and Verified*  --  >15.0* >15.0* 14.8* 12.5*  < > = values in this interval not displayed. GFR Estimated Creatinine Clearance: 76.3 ml/min (by C-G formula based on Cr of 1.37). Liver Function Tests:  Recent Labs Lab 02/21/13 1136 02/22/13 0448 02/23/13 0520  AST 20  17 16   ALT 26 23 16   ALKPHOS 78 73 62  BILITOT 1.0 1.0 0.6  PROT 7.6 6.9 6.0  ALBUMIN 4.2 3.7 3.0*   No results found for this basename: LIPASE, AMYLASE,  in the last 168 hours No results found for this basename: AMMONIA,  in the last 168 hours Coagulation profile  Recent Labs Lab 02/21/13 1136  INR 0.86    CBC:  Recent Labs Lab 02/21/13 0907 02/21/13 1136 02/22/13 0448 02/23/13 0520  WBC 5.7 6.0 5.1 3.8*  NEUTROABS 4.7 5.2  --   --   HGB 11.3* 10.9* 10.1* 8.5*  HCT 33.2* 32.2* 30.0* 25.2*  MCV 92.7 92.8 94.0 92.6  PLT 169 169 146* 135*   Cardiac Enzymes:  Recent Labs Lab 02/21/13 1136  TROPONINI <0.30   BNP: No components found with this basename: POCBNP,  CBG:  Recent Labs Lab 02/22/13 0729 02/22/13 1146 02/22/13 1725 02/22/13 2118 02/23/13 0741  GLUCAP 118* 117* 106* 113* 87   D-Dimer No results found for this basename: DDIMER,  in the last 72 hours Hgb A1c No results found for this basename: HGBA1C,  in the last 72 hours Lipid Profile No results found for this basename: CHOL, HDL, LDLCALC, TRIG, CHOLHDL, LDLDIRECT,  in the last 72 hours Thyroid function studies No results found for this basename: TSH, T4TOTAL, FREET3, T3FREE, THYROIDAB,  in the last 72 hours Anemia work up No results found for this basename: VITAMINB12, FOLATE, FERRITIN, TIBC, IRON, RETICCTPCT,  in the last 72 hours Microbiology No results found for this or any previous visit (from the past 240 hour(s)).    Studies:  Dg Chest 2 View  02/21/2013   *RADIOLOGY REPORT*  Clinical Data: Back pain and weakness  CHEST - 2 VIEW  Comparison: 02/01/2013  Findings: The heart and pulmonary vascularity are within normal limits.  The lungs are clear bilaterally.  No acute bony abnormality is seen.  Some old rib fractures are noted on the right.  IMPRESSION: No acute abnormality noted   Original Report Authenticated By: Alcide Clever, M.D.   Ir Fluoro Guide Cv Line Right  02/22/2013    *RADIOLOGY REPORT*  Clinical Data: Recent diagnosis of multiple myeloma.  The patient requires a Port-A-Cath to begin chemotherapy.  IMPLANTED PORT A CATH PLACEMENT WITH ULTRASOUND AND FLUOROSCOPIC GUIDANCE  Sedation:  3.0 mg IV Versed; 150 mcg IV Fentanyl.  Total Moderate Sedation Time:  45 minutes.  Additional Medications:  2 grams IV Ancef.  As antibiotic prophylaxis, Ancef was ordered pre-procedure and administered intravenously within one hour of incision.  Fluoroscopy Time:  30 seconds.  Procedure:  The procedure, risks, benefits, and alternatives were explained to the patient.  Questions regarding the procedure were encouraged and answered.  The patient understands and consents to the procedure.  The right neck and chest were prepped with chlorhexidine in a sterile fashion, and a sterile drape was applied covering the operative field.  Maximum barrier sterile technique with sterile gowns and gloves were used for the procedure.  Local anesthesia was provided with 1% lidocaine and lidocaine with epinephrine.  Ultrasound was used to confirm patency of the right internal jugular vein.  After creating a small venotomy incision, a 21 gauge needle was advanced into the right internal jugular vein under direct, real-time ultrasound guidance.  Ultrasound image documentation was performed.  After securing guidewire access, an 8 Fr dilator was placed.  A J-wire was kinked to measure appropriate catheter length.  A subcutaneous port pocket was then created along the upper chest wall utilizing sharp and blunt dissection.  Portable cautery was utilized.  The pocket was irrigated with sterile saline.  A single lumen power injectable port was chosen for placement.  The 8 Fr catheter was tunneled from the port pocket site to the venotomy incision.  The port was placed in the pocket.  External catheter was trimmed to appropriate length based on guidewire measurement.  At the venotomy, an 8 Fr peel-away sheath was placed over a  guidewire.  The catheter was then placed through the sheath and the sheath removed.  Final catheter positioning was confirmed and documented with a fluoroscopic spot image.  The port was accessed with a needle and aspirated and flushed with heparinized saline. The needle was left in place.  The venotomy and port pocket incisions were closed with subcutaneous 3-0 Monocryl and subcuticular 4-0 Vicryl.  Dermabond was applied to both incisions.  Complications: None.  No pneumothorax.  Findings:  After catheter placement, the tip lies at the cavoatrial junction.  The catheter aspirates normally and is ready for immediate use.  IMPRESSION:  Placement of single lumen  port a cath via right internal jugular vein.  The catheter tip lies at the cavoatrial junction.  A power injectable port a cath was placed and is ready for immediate use.   Original Report Authenticated By: Irish Lack, M.D.   Ir US Guide Vasc Access Right  02/22/2013   *RADIOLOGY REPORT*  Clinical Data: Recent diagnosis of multiple myeloma.  The patient requires a Port-A-Cath to begin chemotherapy.  IMPLANTED PORT A CATH PLACEMENT WITH ULTRASOUND AND FLUOROSCOPIC GUIDANCE  Sedation:  3.0 mg IV Versed; 150 mcg IV Fentanyl.  Total Moderate Sedation Time:  45 minutes.  Additional Medications:  2 grams IV Ancef.  As antibiotic prophylaxis, Ancef was ordered pre-procedure and administered intravenously within one hour of incision.  Fluoroscopy Time:  30 seconds.  Procedure:  The procedure, risks, benefits, and alternatives were explained to the patient.  Questions regarding the procedure were encouraged and answered.  The patient understands and consents to the procedure.  The right neck and chest were prepped with chlorhexidine in a sterile fashion, and a sterile drape was applied covering the operative field.  Maximum barrier sterile technique with sterile gowns and gloves were used for the procedure.  Local anesthesia was provided with 1% lidocaine and  lidocaine with epinephrine.  Ultrasound was used to confirm patency of the right internal jugular vein.  After creating a small venotomy incision, a 21 gauge needle was advanced into the right internal jugular vein under direct, real-time ultrasound guidance.  Ultrasound image documentation was performed.  After securing guidewire access, an 8 Fr dilator was placed.  A J-wire was kinked to measure appropriate catheter length.  A subcutaneous port pocket was then created along the upper chest wall utilizing sharp and blunt dissection.  Portable cautery was utilized.  The pocket was irrigated with sterile saline.  A single lumen power injectable port was chosen for placement.  The 8 Fr catheter was tunneled from the port pocket site to the venotomy incision.  The port was placed in the pocket.  External catheter was trimmed to appropriate length based on guidewire measurement.  At the venotomy, an 8 Fr peel-away sheath was placed over a guidewire.  The catheter was then placed through the sheath and the sheath removed.  Final catheter positioning was confirmed and documented with a fluoroscopic spot image.  The port was accessed with a needle and aspirated and flushed with heparinized saline. The needle was left in place.  The venotomy and port pocket incisions were closed with subcutaneous 3-0 Monocryl and subcuticular 4-0 Vicryl.  Dermabond was applied to both incisions.  Complications: None.  No pneumothorax.  Findings:  After catheter placement, the tip lies at the cavoatrial junction.  The catheter aspirates normally and is ready for immediate use.  IMPRESSION:  Placement of single lumen port a cath via right internal jugular vein.  The catheter tip lies at the cavoatrial junction.  A power injectable port a cath was placed and is ready for immediate use.   Original Report Authenticated By: Irish Lack, M.D.    Assessment: 42 y.o. 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 His bortezomib can be changed to sQ with subsequent treatments and the dexamethasone to po;  as per Dr Gustavo Lah discretion. The dexamethasone in orders was doubled (premeds and treatment) and I cancelled one of those orders. I added lorazepam to premeds. May treat despite labs  Added tramadol for pain and a bowel prophylaxis regimen; liberalized activity orders so patient can start getting OOB and ambulate in halls with assistance.    Lowella Dell, MD 02/23/2013  7:45 AM

## 2013-02-23 NOTE — Progress Notes (Signed)
[facility] PROGRESS NOTE  Mark [name] ZOX:[id number] DOB: 30-Dec-1970 DOA: 02/21/2013 PCP: No primary provider on file.  Brief narrative 42 year old male with history of multiple myeloma (right lumbosacral mass confirmed as plasmacytoma), on radiation therapy to the lumbosacral mass, admitted to the [facility] on 02/21/2013 with complaints of generalized weakness, polyuria and polydipsia. In the ED, serum calcium 17.7, hemoglobin 10.9 and creatinine 1.3. Hospitalist admission was requested.  Assessment/Plan: 1. Hypercalcemia: Most likely secondary to multiple myeloma.  Patient aggressively hydrated and currently seems euvolemic-we'll reduce IVF. Patient status post zoledronic acid x1 and calcitonin x3. Continue steroids. Oncology input appreciated and plan to start 7/26. Hypercalcemia improving. Follow daily BMP. 2. Multiple myeloma: Oncology input appreciated. Port-A-Cath placed 7/25 and starting chemotherapy. Oncology following. 3. Anemia/pancytopenia: Secondary to multiple myeloma. Hemoglobin is dropped from 10.1 > 8.5-possibly dilutional. Transfuse if hemoglobin less than 8 g per DL. Follow daily CBCs 4. Dehydration: secondary to #1. Improved. Reduce IV fluids. 5. Constipation: Secondary to hypercalcemia and pain medications. Bowel regimen started. Patient wishes to try an enema. 6. Stage II chronic kidney disease: Creatinine probably at baseline. 7. Chronic low back pain/lumbosacral mass: No neurological deficits. Pain management and continued radiation treatment. Patient also has some pain at the Port-A-Cath site. 8. Steroid induced Hyperglycemia: last A1C: 5.2. Continue current insulins: Levemir, Novolog meal & SSI. Good IP control. 9. Nausea and vomiting: Unclear etiology.? Secondary to chemotherapy. Antiemetics and monitor.  Code Status: Full Family Communication: None Disposition Plan: Home when medically stable.   Consultants:  Oncology  Interventional  radiology  Procedures:  Port-A-Cath insertion 7/25  Antibiotics:  None   HPI/Subjective: Patient had a couple of episodes of vomiting this morning without coffee-ground or blood. No abdominal pain. No BM. Low back pain is better than on admission. Mild pain at work the cath insertion site.  Objective: Filed Vitals:   02/22/13 1754 02/22/13 1905 02/22/13 2125 02/23/13 0640  BP: 131/71 137/70 133/74 124/77  Pulse: 90 95 96 92  Temp: 98.2 F (36.8 C) 97.6 F (36.4 C) 99.3 F (37.4 C) 99 F (37.2 C)  TempSrc: Oral Oral Oral Oral  Resp: 18 20 20 20   Height:      Weight:    84.3 kg (185 lb 13.6 oz)  SpO2: 100% 98% 98% 98%    Intake/Output Summary (Last 24 hours) at 02/23/13 1225 Last data filed at 02/23/13 0200  Gross per 24 hour  Intake    240 ml  Output    850 ml  Net   -610 ml   Filed Weights   02/21/13 1513 02/23/13 0640  Weight: 84.7 kg (186 lb 11.7 oz) 84.3 kg (185 lb 13.6 oz)    Exam:   General exam: Comfortable.mucosa moist.  Respiratory system: Clear. No increased work of breathing. Porta cath right upper chest. Site looks clean, dry and intact.  Cardiovascular system: S1 & S2 heard, RRR. No JVD, murmurs, gallops, clicks or pedal edema.  Gastrointestinal system: Abdomen is nondistended, soft and nontender. Normal bowel sounds heard.  Central nervous system: Alert and oriented. No focal neurological deficits.  Extremities: Symmetric 5 x 5 power.   Data Reviewed: Basic Metabolic Panel:  Recent Labs Lab 02/21/13 0908 02/21/13 1136 02/21/13 2031 02/22/13 0448 02/23/13 0520  NA 145 140 143 143 142  K 3.8 3.5 3.9 4.0 4.0  CL  --  100 104 106 108  CO2 32* 33* 33* 33* 28  GLUCOSE 115 104* 134* 132* 107*  BUN 19.5 20  19 20 20   CREATININE 1.5* 1.38* 1.41* 1.51* 1.37*  CALCIUM 17.7 Repeated and Verified* >15.0* >15.0* 14.8* 12.5*   Liver Function Tests:  Recent Labs Lab 02/21/13 1136 02/22/13 0448 02/23/13 0520  AST 20 17 16   ALT 26 23 16    ALKPHOS 78 73 62  BILITOT 1.0 1.0 0.6  PROT 7.6 6.9 6.0  ALBUMIN 4.2 3.7 3.0*   No results found for this basename: LIPASE, AMYLASE,  in the last 168 hours No results found for this basename: AMMONIA,  in the last 168 hours CBC:  Recent Labs Lab 02/21/13 0907 02/21/13 1136 02/22/13 0448 02/23/13 0520  WBC 5.7 6.0 5.1 3.8*  NEUTROABS 4.7 5.2  --   --   HGB 11.3* 10.9* 10.1* 8.5*  HCT 33.2* 32.2* 30.0* 25.2*  MCV 92.7 92.8 94.0 92.6  PLT 169 169 146* 135*   Cardiac Enzymes:  Recent Labs Lab 02/21/13 1136  TROPONINI <0.30   BNP (last 3 results) No results found for this basename: PROBNP,  in the last 8760 hours CBG:  Recent Labs Lab 02/22/13 1146 02/22/13 1725 02/22/13 2118 02/23/13 0741 02/23/13 1207  GLUCAP 117* 106* 113* 87 85    No results found for this or any previous visit (from the past 240 hour(s)).   Studies: Ir Fluoro Guide Cv Line Right  02/22/2013   *RADIOLOGY REPORT*  Clinical Data: Recent diagnosis of multiple myeloma.  The patient requires a Port-A-Cath to begin chemotherapy.  IMPLANTED PORT A CATH PLACEMENT WITH ULTRASOUND AND FLUOROSCOPIC GUIDANCE  Sedation:  3.0 mg IV Versed; 150 mcg IV Fentanyl.  Total Moderate Sedation Time:  45 minutes.  Additional Medications:  2 grams IV Ancef.  As antibiotic prophylaxis, Ancef was ordered pre-procedure and administered intravenously within one hour of incision.  Fluoroscopy Time:  30 seconds.  Procedure:  The procedure, risks, benefits, and alternatives were explained to the patient.  Questions regarding the procedure were encouraged and answered.  The patient understands and consents to the procedure.  The right neck and chest were prepped with chlorhexidine in a sterile fashion, and a sterile drape was applied covering the operative field.  Maximum barrier sterile technique with sterile gowns and gloves were used for the procedure.  Local anesthesia was provided with 1% lidocaine and lidocaine with epinephrine.   Ultrasound was used to confirm patency of the right internal jugular vein.  After creating a small venotomy incision, a 21 gauge needle was advanced into the right internal jugular vein under direct, real-time ultrasound guidance.  Ultrasound image documentation was performed.  After securing guidewire access, an 8 Fr dilator was placed.  A J-wire was kinked to measure appropriate catheter length.  A subcutaneous port pocket was then created along the upper chest wall utilizing sharp and blunt dissection.  Portable cautery was utilized.  The pocket was irrigated with sterile saline.  A single lumen power injectable port was chosen for placement.  The 8 Fr catheter was tunneled from the port pocket site to the venotomy incision.  The port was placed in the pocket.  External catheter was trimmed to appropriate length based on guidewire measurement.  At the venotomy, an 8 Fr peel-away sheath was placed over a guidewire.  The catheter was then placed through the sheath and the sheath removed.  Final catheter positioning was confirmed and documented with a fluoroscopic spot image.  The port was accessed with a needle and aspirated and flushed with heparinized saline. The needle was left in place.  The  venotomy and port pocket incisions were closed with subcutaneous 3-0 Monocryl and subcuticular 4-0 Vicryl.  Dermabond was applied to both incisions.  Complications: None.  No pneumothorax.  Findings:  After catheter placement, the tip lies at the cavoatrial junction.  The catheter aspirates normally and is ready for immediate use.  IMPRESSION:  Placement of single lumen port a cath via right internal jugular vein.  The catheter tip lies at the cavoatrial junction.  A power injectable port a cath was placed and is ready for immediate use.   Original Report Authenticated By: Irish Lack, M.D.   Ir US Guide Vasc Access Right  02/22/2013   *RADIOLOGY REPORT*  Clinical Data: Recent diagnosis of multiple myeloma.  The  patient requires a Port-A-Cath to begin chemotherapy.  IMPLANTED PORT A CATH PLACEMENT WITH ULTRASOUND AND FLUOROSCOPIC GUIDANCE  Sedation:  3.0 mg IV Versed; 150 mcg IV Fentanyl.  Total Moderate Sedation Time:  45 minutes.  Additional Medications:  2 grams IV Ancef.  As antibiotic prophylaxis, Ancef was ordered pre-procedure and administered intravenously within one hour of incision.  Fluoroscopy Time:  30 seconds.  Procedure:  The procedure, risks, benefits, and alternatives were explained to the patient.  Questions regarding the procedure were encouraged and answered.  The patient understands and consents to the procedure.  The right neck and chest were prepped with chlorhexidine in a sterile fashion, and a sterile drape was applied covering the operative field.  Maximum barrier sterile technique with sterile gowns and gloves were used for the procedure.  Local anesthesia was provided with 1% lidocaine and lidocaine with epinephrine.  Ultrasound was used to confirm patency of the right internal jugular vein.  After creating a small venotomy incision, a 21 gauge needle was advanced into the right internal jugular vein under direct, real-time ultrasound guidance.  Ultrasound image documentation was performed.  After securing guidewire access, an 8 Fr dilator was placed.  A J-wire was kinked to measure appropriate catheter length.  A subcutaneous port pocket was then created along the upper chest wall utilizing sharp and blunt dissection.  Portable cautery was utilized.  The pocket was irrigated with sterile saline.  A single lumen power injectable port was chosen for placement.  The 8 Fr catheter was tunneled from the port pocket site to the venotomy incision.  The port was placed in the pocket.  External catheter was trimmed to appropriate length based on guidewire measurement.  At the venotomy, an 8 Fr peel-away sheath was placed over a guidewire.  The catheter was then placed through the sheath and the sheath  removed.  Final catheter positioning was confirmed and documented with a fluoroscopic spot image.  The port was accessed with a needle and aspirated and flushed with heparinized saline. The needle was left in place.  The venotomy and port pocket incisions were closed with subcutaneous 3-0 Monocryl and subcuticular 4-0 Vicryl.  Dermabond was applied to both incisions.  Complications: None.  No pneumothorax.  Findings:  After catheter placement, the tip lies at the cavoatrial junction.  The catheter aspirates normally and is ready for immediate use.  IMPRESSION:  Placement of single lumen port a cath via right internal jugular vein.  The catheter tip lies at the cavoatrial junction.  A power injectable port a cath was placed and is ready for immediate use.   Original Report Authenticated By: Irish Lack, M.D.     Additional labs:   Scheduled Meds: . sodium chloride   Intravenous Once  .  bortezomib SQ  1.3 mg/m2 (Treatment Plan Actual) Subcutaneous Once  .  ceFAZolin (ANCEF) IV  2 g Intravenous On Call  . cyclophosphamide  300 mg/m2 (Treatment Plan Actual) Intravenous Once  . dexamethasone  4 mg Oral BID WC  . famciclovir  250 mg Oral Daily  . heparin  5,000 Units Subcutaneous Q8H  . insulin aspart  0-15 Units Subcutaneous TID WC  . insulin aspart  0-5 Units Subcutaneous QHS  . insulin aspart  4 Units Subcutaneous TID WC  . insulin detemir  10 Units Subcutaneous QHS  . polyethylene glycol  17 g Oral Daily  . senna  2 tablet Oral Daily  . sodium chloride  3 mL Intravenous Q12H   Continuous Infusions: . sodium chloride 200 mL/hr at 02/23/13 1610    Principal Problem:   Hypercalcemia Active Problems:   Multiple myeloma   Anemia   Dehydration   Unspecified constipation   Chronic kidney disease (CKD), stage II (mild)    Time spent: 25 minutes.    [facility]  [facility] Pager [phone].   If 8PM-8AM, please contact night-coverage at [email or web address], password  [employer] 02/23/2013, 12:25 PM  LOS: 2 days

## 2013-02-24 DIAGNOSIS — R112 Nausea with vomiting, unspecified: Secondary | ICD-10-CM

## 2013-02-24 LAB — GLUCOSE, CAPILLARY
Glucose-Capillary: 112 mg/dL — ABNORMAL HIGH (ref 70–99)
Glucose-Capillary: 88 mg/dL (ref 70–99)

## 2013-02-24 LAB — BASIC METABOLIC PANEL
BUN: 23 mg/dL (ref 6–23)
CO2: 25 mEq/L (ref 19–32)
Calcium: 13.6 mg/dL (ref 8.4–10.5)
Chloride: 101 mEq/L (ref 96–112)
Creatinine, Ser: 1.38 mg/dL — ABNORMAL HIGH (ref 0.50–1.35)
Glucose, Bld: 109 mg/dL — ABNORMAL HIGH (ref 70–99)

## 2013-02-24 LAB — CBC
HCT: 26.9 % — ABNORMAL LOW (ref 39.0–52.0)
Hemoglobin: 9.4 g/dL — ABNORMAL LOW (ref 13.0–17.0)
MCH: 31.6 pg (ref 26.0–34.0)
MCV: 90.6 fL (ref 78.0–100.0)
RBC: 2.97 MIL/uL — ABNORMAL LOW (ref 4.22–5.81)

## 2013-02-24 MED ORDER — CALCITONIN (SALMON) 200 UNIT/ML IJ SOLN
500.0000 [IU] | Freq: Two times a day (BID) | INTRAMUSCULAR | Status: AC
  Administered 2013-02-24 – 2013-02-25 (×2): 500 [IU] via SUBCUTANEOUS
  Filled 2013-02-24 (×2): qty 2.5

## 2013-02-24 MED ORDER — FAMOTIDINE 20 MG PO TABS
20.0000 mg | ORAL_TABLET | Freq: Two times a day (BID) | ORAL | Status: DC
  Administered 2013-02-24 – 2013-02-25 (×2): 20 mg via ORAL
  Filled 2013-02-24 (×6): qty 1

## 2013-02-24 NOTE — Progress Notes (Signed)
CRITICAL VALUE ALERT  Critical value received:  Calcium of 13.6  Date of notification:  02/24/2013  Time of notification:  0640  Critical value read back:yes  Nurse who received alert:  Victorino Dike,  RN  MD notified (1st page): Dr  Waymon Amato   Time of first page:  07:38   Responding MD: [facility]

## 2013-02-24 NOTE — Progress Notes (Signed)
Mark [name]   DOB:09-Aug-1970   ZO#:[id number]   JWJ#:[id number]  Subjective:  Mark [name] is doing better. He had episode of abdominal pain but that is resolved. The patient continue to have low back pain. He had BM yesterday. No other complaints. Family in room   Review of Systems  Constitutional: Negative for fever, chills, malaise/fatigue and diaphoresis.  HENT: Negative for hearing loss, ear pain, nosebleeds, congestion, sore throat, neck pain and tinnitus.   Eyes: Negative for double vision.  Respiratory: Negative for cough, hemoptysis, sputum production and shortness of breath.   Cardiovascular: Negative for chest pain, palpitations and leg swelling.  Gastrointestinal: Positive for abdominal pain and constipation. Negative for nausea, vomiting, diarrhea and blood in stool.  Genitourinary: Negative for dysuria, urgency, frequency and hematuria.  Musculoskeletal: Positive for back pain. Negative for myalgias.  Neurological: Negative for dizziness, seizures, loss of consciousness and headaches.  Psychiatric/Behavioral: Negative for depression. The patient does not have insomnia.    Objective: young [place] male examined in bed.  Filed Vitals:   02/24/13 0653  BP: 134/88  Pulse: 78  Temp: 98.4 F (36.9 C)  Resp: 18    Body mass index is 26.95 kg/(m^2).  Intake/Output Summary (Last 24 hours) at 02/24/13 1119 Last data filed at 02/23/13 1700  Gross per 24 hour  Intake 1563.33 ml  Output    500 ml  Net 1063.33 ml     Sclerae unicteric  Oropharynx clear  Lungs clear -- no rales or rhonchi  Heart regular rate and rhythm  Abdomen benign  MSK no focal spinal tenderness, no peripheral edema  Neuro nonfocal   CBG (last 3)   Recent Labs  02/23/13 0741 02/23/13 1207 02/23/13 1709  GLUCAP 87 85 138*     Labs:   ANC 5.2 on 02/21/2013  Lab Results  Component Value Date   WBC 5.4 02/24/2013   HGB 9.4* 02/24/2013   HCT 26.9* 02/24/2013   MCV 90.6 02/24/2013   PLT  174 02/24/2013   NEUTROABS 5.2 02/21/2013     Basic Metabolic Panel:  Recent Labs Lab 02/21/13 1136 02/21/13 2031 02/22/13 0448 02/23/13 0520 02/24/13 0602  NA 140 143 143 142 134*  K 3.5 3.9 4.0 4.0 3.6  CL 100 104 106 108 101  CO2 33* 33* 33* 28 25  GLUCOSE 104* 134* 132* 107* 109*  BUN 20 19 20 20 23   CREATININE 1.38* 1.41* 1.51* 1.37* 1.38*  CALCIUM >15.0* >15.0* 14.8* 12.5* 13.6*   GFR Estimated Creatinine Clearance: 75.8 ml/min (by C-G formula based on Cr of 1.38). Liver Function Tests:  Recent Labs Lab 02/21/13 1136 02/22/13 0448 02/23/13 0520  AST 20 17 16   ALT 26 23 16   ALKPHOS 78 73 62  BILITOT 1.0 1.0 0.6  PROT 7.6 6.9 6.0  ALBUMIN 4.2 3.7 3.0*     Coagulation profile  Recent Labs Lab 02/21/13 1136  INR 0.86    CBC:  Recent Labs Lab 02/21/13 0907 02/21/13 1136 02/22/13 0448 02/23/13 0520 02/24/13 0602  WBC 5.7 6.0 5.1 3.8* 5.4  NEUTROABS 4.7 5.2  --   --   --   HGB 11.3* 10.9* 10.1* 8.5* 9.4*  HCT 33.2* 32.2* 30.0* 25.2* 26.9*  MCV 92.7 92.8 94.0 92.6 90.6  PLT 169 169 146* 135* 174   Cardiac Enzymes:  Recent Labs Lab 02/21/13 1136  TROPONINI <0.30    CBG:  Recent Labs Lab 02/22/13 1725 02/22/13 2118 02/23/13 0741 02/23/13 1207 02/23/13 1709  GLUCAP 106*  113* 87 85 138*      Studies:  Ir Fluoro Guide Cv Line Right  02/22/2013   *RADIOLOGY REPORT*  Clinical Data: Recent diagnosis of multiple myeloma.  The patient requires a Port-A-Cath to begin chemotherapy.  IMPLANTED PORT A CATH PLACEMENT WITH ULTRASOUND AND FLUOROSCOPIC GUIDANCE  Sedation:  3.0 mg IV Versed; 150 mcg IV Fentanyl.  Total Moderate Sedation Time:  45 minutes.  Additional Medications:  2 grams IV Ancef.  As antibiotic prophylaxis, Ancef was ordered pre-procedure and administered intravenously within one hour of incision.  Fluoroscopy Time:  30 seconds.  Procedure:  The procedure, risks, benefits, and alternatives were explained to the patient.  Questions  regarding the procedure were encouraged and answered.  The patient understands and consents to the procedure.  The right neck and chest were prepped with chlorhexidine in a sterile fashion, and a sterile drape was applied covering the operative field.  Maximum barrier sterile technique with sterile gowns and gloves were used for the procedure.  Local anesthesia was provided with 1% lidocaine and lidocaine with epinephrine.  Ultrasound was used to confirm patency of the right internal jugular vein.  After creating a small venotomy incision, a 21 gauge needle was advanced into the right internal jugular vein under direct, real-time ultrasound guidance.  Ultrasound image documentation was performed.  After securing guidewire access, an 8 Fr dilator was placed.  A J-wire was kinked to measure appropriate catheter length.  A subcutaneous port pocket was then created along the upper chest wall utilizing sharp and blunt dissection.  Portable cautery was utilized.  The pocket was irrigated with sterile saline.  A single lumen power injectable port was chosen for placement.  The 8 Fr catheter was tunneled from the port pocket site to the venotomy incision.  The port was placed in the pocket.  External catheter was trimmed to appropriate length based on guidewire measurement.  At the venotomy, an 8 Fr peel-away sheath was placed over a guidewire.  The catheter was then placed through the sheath and the sheath removed.  Final catheter positioning was confirmed and documented with a fluoroscopic spot image.  The port was accessed with a needle and aspirated and flushed with heparinized saline. The needle was left in place.  The venotomy and port pocket incisions were closed with subcutaneous 3-0 Monocryl and subcuticular 4-0 Vicryl.  Dermabond was applied to both incisions.  Complications: None.  No pneumothorax.  Findings:  After catheter placement, the tip lies at the cavoatrial junction.  The catheter aspirates normally and  is ready for immediate use.  IMPRESSION:  Placement of single lumen port a cath via right internal jugular vein.  The catheter tip lies at the cavoatrial junction.  A power injectable port a cath was placed and is ready for immediate use.   Original Report Authenticated By: Irish Lack, M.D.   Ir US Guide Vasc Access Right  02/22/2013   *RADIOLOGY REPORT*  Clinical Data: Recent diagnosis of multiple myeloma.  The patient requires a Port-A-Cath to begin chemotherapy.  IMPLANTED PORT A CATH PLACEMENT WITH ULTRASOUND AND FLUOROSCOPIC GUIDANCE  Sedation:  3.0 mg IV Versed; 150 mcg IV Fentanyl.  Total Moderate Sedation Time:  45 minutes.  Additional Medications:  2 grams IV Ancef.  As antibiotic prophylaxis, Ancef was ordered pre-procedure and administered intravenously within one hour of incision.  Fluoroscopy Time:  30 seconds.  Procedure:  The procedure, risks, benefits, and alternatives were explained to the patient.  Questions regarding the procedure  were encouraged and answered.  The patient understands and consents to the procedure.  The right neck and chest were prepped with chlorhexidine in a sterile fashion, and a sterile drape was applied covering the operative field.  Maximum barrier sterile technique with sterile gowns and gloves were used for the procedure.  Local anesthesia was provided with 1% lidocaine and lidocaine with epinephrine.  Ultrasound was used to confirm patency of the right internal jugular vein.  After creating a small venotomy incision, a 21 gauge needle was advanced into the right internal jugular vein under direct, real-time ultrasound guidance.  Ultrasound image documentation was performed.  After securing guidewire access, an 8 Fr dilator was placed.  A J-wire was kinked to measure appropriate catheter length.  A subcutaneous port pocket was then created along the upper chest wall utilizing sharp and blunt dissection.  Portable cautery was utilized.  The pocket was irrigated with  sterile saline.  A single lumen power injectable port was chosen for placement.  The 8 Fr catheter was tunneled from the port pocket site to the venotomy incision.  The port was placed in the pocket.  External catheter was trimmed to appropriate length based on guidewire measurement.  At the venotomy, an 8 Fr peel-away sheath was placed over a guidewire.  The catheter was then placed through the sheath and the sheath removed.  Final catheter positioning was confirmed and documented with a fluoroscopic spot image.  The port was accessed with a needle and aspirated and flushed with heparinized saline. The needle was left in place.  The venotomy and port pocket incisions were closed with subcutaneous 3-0 Monocryl and subcuticular 4-0 Vicryl.  Dermabond was applied to both incisions.  Complications: None.  No pneumothorax.  Findings:  After catheter placement, the tip lies at the cavoatrial junction.  The catheter aspirates normally and is ready for immediate use.  IMPRESSION:  Placement of single lumen port a cath via right internal jugular vein.  The catheter tip lies at the cavoatrial junction.  A power injectable port a cath was placed and is ready for immediate use.   Original Report Authenticated By: Irish Lack, M.D.    Assessment: 42 y.o. 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 (4) s/p port placement 02/22/2013; (5) constipation  Plan: I discussed with  the patient  and family medications his treatment and bone marrow transplant. I recommend to discuss all details with  Dr Myna Hidalgo.  I increased IV fluid to 125 ml/h and will follow patient closely.  We discussed his constipation for which we also can try milk of magnesium if he continue constipate.  Myra Rude, MD 02/24/2013  11:19 AM

## 2013-02-24 NOTE — Progress Notes (Signed)
[facility] PROGRESS NOTE  Braxson Hollingsworth XBJ:[id number] DOB: 02-24-1971 DOA: 02/21/2013 PCP: No primary provider on file.  Brief narrative 42 year old male with history of multiple myeloma (right lumbosacral mass confirmed as plasmacytoma), on radiation therapy to the lumbosacral mass, admitted to the [facility] on 02/21/2013 with complaints of generalized weakness, polyuria and polydipsia. In the ED, serum calcium 17.7, hemoglobin 10.9 and creatinine 1.3. Hospitalist admission was requested.  Assessment/Plan: 1. Hypercalcemia: Most likely secondary to multiple myeloma.  Patient aggressively hydrated and currently seems euvolemic-we'll reduce IVF. Patient status post zoledronic acid x1 and calcitonin x3. Continue steroids. Oncology input appreciated and plan to start 7/26. Calcium slightly higher today-corrected calcium 14.4. No change in management. Followup BMP name. 2. Multiple myeloma: Oncology input appreciated. Port-A-Cath placed 7/25 and started chemotherapy 7/26. Oncology following. 3. Anemia/pancytopenia: Secondary to multiple myeloma. Hemoglobin is dropped from 10.1 > 8.5-possibly dilutional. Transfuse if hemoglobin less than 8 g per DL. WBC & platelets normal. Hemoglobin stable. 4. Dehydration: secondary to #1. Improved. Continue current IV fluids at 75 mL per hour 5. Constipation: Secondary to hypercalcemia and pain medications. Bowel regimen started. Patient had a good BM after enema on 7/26 6. Stage II chronic kidney disease: Creatinine probably at baseline. 7. Chronic low back pain/lumbosacral mass: No neurological deficits. Pain management and continued radiation treatment. Patient also has some pain at the Port-A-Cath site-better. 8. Steroid induced Hyperglycemia: last A1C: 5.2. Continue current insulins: Levemir, Novolog meal & SSI. Good IP control. 9. Nausea and vomiting: Unclear etiology.? Secondary to chemotherapy. Antiemetics and monitor. Resolved.  Code Status:  Full Family Communication: Discussed with spouse at bedside. Disposition Plan: Home when medically stable.   Consultants:  Oncology  Interventional radiology  Procedures:  Port-A-Cath insertion 7/25  Antibiotics:  None   HPI/Subjective: No further nausea or vomiting. Some dyspepsia/abdominal pain after eating-resolved. Had a good BM after an enema on 7/26. Reduced pain at Port-A-Cath site.  Objective: Filed Vitals:   02/23/13 0640 02/23/13 1305 02/23/13 2226 02/24/13 0653  BP: 124/77 125/71 135/85 134/88  Pulse: 92 92 87 78  Temp: 99 F (37.2 C) 99.1 F (37.3 C) 99.1 F (37.3 C) 98.4 F (36.9 C)  TempSrc: Oral Oral Oral Oral  Resp: 20 18 16 18   Height:      Weight: 84.3 kg (185 lb 13.6 oz)   87.6 kg (193 lb 2 oz)  SpO2: 98% 100% 100% 100%    Intake/Output Summary (Last 24 hours) at 02/24/13 1228 Last data filed at 02/23/13 1700  Gross per 24 hour  Intake 1563.33 ml  Output    500 ml  Net 1063.33 ml   Filed Weights   02/21/13 1513 02/23/13 0640 02/24/13 0653  Weight: 84.7 kg (186 lb 11.7 oz) 84.3 kg (185 lb 13.6 oz) 87.6 kg (193 lb 2 oz)    Exam:   General exam: Comfortable. Mucosa moist.  Respiratory system: Clear. No increased work of breathing. Porta cath right upper chest. Site looks clean, dry and intact.  Cardiovascular system: S1 & S2 heard, RRR. No JVD, murmurs, gallops, clicks or pedal edema.  Gastrointestinal system: Abdomen is nondistended, soft and nontender. Normal bowel sounds heard.  Central nervous system: Alert and oriented. No focal neurological deficits.  Extremities: Symmetric 5 x 5 power.   Data Reviewed: Basic Metabolic Panel:  Recent Labs Lab 02/21/13 1136 02/21/13 2031 02/22/13 0448 02/23/13 0520 02/24/13 0602  NA 140 143 143 142 134*  K 3.5 3.9 4.0 4.0 3.6  CL 100  104 106 108 101  CO2 33* 33* 33* 28 25  GLUCOSE 104* 134* 132* 107* 109*  BUN 20 19 20 20 23   CREATININE 1.38* 1.41* 1.51* 1.37* 1.38*  CALCIUM  >15.0* >15.0* 14.8* 12.5* 13.6*   Liver Function Tests:  Recent Labs Lab 02/21/13 1136 02/22/13 0448 02/23/13 0520  AST 20 17 16   ALT 26 23 16   ALKPHOS 78 73 62  BILITOT 1.0 1.0 0.6  PROT 7.6 6.9 6.0  ALBUMIN 4.2 3.7 3.0*   No results found for this basename: LIPASE, AMYLASE,  in the last 168 hours No results found for this basename: AMMONIA,  in the last 168 hours CBC:  Recent Labs Lab 02/21/13 0907 02/21/13 1136 02/22/13 0448 02/23/13 0520 02/24/13 0602  WBC 5.7 6.0 5.1 3.8* 5.4  NEUTROABS 4.7 5.2  --   --   --   HGB 11.3* 10.9* 10.1* 8.5* 9.4*  HCT 33.2* 32.2* 30.0* 25.2* 26.9*  MCV 92.7 92.8 94.0 92.6 90.6  PLT 169 169 146* 135* 174   Cardiac Enzymes:  Recent Labs Lab 02/21/13 1136  TROPONINI <0.30   BNP (last 3 results) No results found for this basename: PROBNP,  in the last 8760 hours CBG:  Recent Labs Lab 02/22/13 2118 02/23/13 0741 02/23/13 1207 02/23/13 1709 02/23/13 2224  GLUCAP 113* 87 85 138* 116*    No results found for this or any previous visit (from the past 240 hour(s)).   Studies: Ir Fluoro Guide Cv Line Right  02/22/2013   *RADIOLOGY REPORT*  Clinical Data: Recent diagnosis of multiple myeloma.  The patient requires a Port-A-Cath to begin chemotherapy.  IMPLANTED PORT A CATH PLACEMENT WITH ULTRASOUND AND FLUOROSCOPIC GUIDANCE  Sedation:  3.0 mg IV Versed; 150 mcg IV Fentanyl.  Total Moderate Sedation Time:  45 minutes.  Additional Medications:  2 grams IV Ancef.  As antibiotic prophylaxis, Ancef was ordered pre-procedure and administered intravenously within one hour of incision.  Fluoroscopy Time:  30 seconds.  Procedure:  The procedure, risks, benefits, and alternatives were explained to the patient.  Questions regarding the procedure were encouraged and answered.  The patient understands and consents to the procedure.  The right neck and chest were prepped with chlorhexidine in a sterile fashion, and a sterile drape was applied  covering the operative field.  Maximum barrier sterile technique with sterile gowns and gloves were used for the procedure.  Local anesthesia was provided with 1% lidocaine and lidocaine with epinephrine.  Ultrasound was used to confirm patency of the right internal jugular vein.  After creating a small venotomy incision, a 21 gauge needle was advanced into the right internal jugular vein under direct, real-time ultrasound guidance.  Ultrasound image documentation was performed.  After securing guidewire access, an 8 Fr dilator was placed.  A J-wire was kinked to measure appropriate catheter length.  A subcutaneous port pocket was then created along the upper chest wall utilizing sharp and blunt dissection.  Portable cautery was utilized.  The pocket was irrigated with sterile saline.  A single lumen power injectable port was chosen for placement.  The 8 Fr catheter was tunneled from the port pocket site to the venotomy incision.  The port was placed in the pocket.  External catheter was trimmed to appropriate length based on guidewire measurement.  At the venotomy, an 8 Fr peel-away sheath was placed over a guidewire.  The catheter was then placed through the sheath and the sheath removed.  Final catheter positioning was confirmed and  documented with a fluoroscopic spot image.  The port was accessed with a needle and aspirated and flushed with heparinized saline. The needle was left in place.  The venotomy and port pocket incisions were closed with subcutaneous 3-0 Monocryl and subcuticular 4-0 Vicryl.  Dermabond was applied to both incisions.  Complications: None.  No pneumothorax.  Findings:  After catheter placement, the tip lies at the cavoatrial junction.  The catheter aspirates normally and is ready for immediate use.  IMPRESSION:  Placement of single lumen port a cath via right internal jugular vein.  The catheter tip lies at the cavoatrial junction.  A power injectable port a cath was placed and is ready  for immediate use.   Original Report Authenticated By: Irish Lack, M.D.   Ir US Guide Vasc Access Right  02/22/2013   *RADIOLOGY REPORT*  Clinical Data: Recent diagnosis of multiple myeloma.  The patient requires a Port-A-Cath to begin chemotherapy.  IMPLANTED PORT A CATH PLACEMENT WITH ULTRASOUND AND FLUOROSCOPIC GUIDANCE  Sedation:  3.0 mg IV Versed; 150 mcg IV Fentanyl.  Total Moderate Sedation Time:  45 minutes.  Additional Medications:  2 grams IV Ancef.  As antibiotic prophylaxis, Ancef was ordered pre-procedure and administered intravenously within one hour of incision.  Fluoroscopy Time:  30 seconds.  Procedure:  The procedure, risks, benefits, and alternatives were explained to the patient.  Questions regarding the procedure were encouraged and answered.  The patient understands and consents to the procedure.  The right neck and chest were prepped with chlorhexidine in a sterile fashion, and a sterile drape was applied covering the operative field.  Maximum barrier sterile technique with sterile gowns and gloves were used for the procedure.  Local anesthesia was provided with 1% lidocaine and lidocaine with epinephrine.  Ultrasound was used to confirm patency of the right internal jugular vein.  After creating a small venotomy incision, a 21 gauge needle was advanced into the right internal jugular vein under direct, real-time ultrasound guidance.  Ultrasound image documentation was performed.  After securing guidewire access, an 8 Fr dilator was placed.  A J-wire was kinked to measure appropriate catheter length.  A subcutaneous port pocket was then created along the upper chest wall utilizing sharp and blunt dissection.  Portable cautery was utilized.  The pocket was irrigated with sterile saline.  A single lumen power injectable port was chosen for placement.  The 8 Fr catheter was tunneled from the port pocket site to the venotomy incision.  The port was placed in the pocket.  External catheter  was trimmed to appropriate length based on guidewire measurement.  At the venotomy, an 8 Fr peel-away sheath was placed over a guidewire.  The catheter was then placed through the sheath and the sheath removed.  Final catheter positioning was confirmed and documented with a fluoroscopic spot image.  The port was accessed with a needle and aspirated and flushed with heparinized saline. The needle was left in place.  The venotomy and port pocket incisions were closed with subcutaneous 3-0 Monocryl and subcuticular 4-0 Vicryl.  Dermabond was applied to both incisions.  Complications: None.  No pneumothorax.  Findings:  After catheter placement, the tip lies at the cavoatrial junction.  The catheter aspirates normally and is ready for immediate use.  IMPRESSION:  Placement of single lumen port a cath via right internal jugular vein.  The catheter tip lies at the cavoatrial junction.  A power injectable port a cath was placed and is ready for immediate  use.   Original Report Authenticated By: Irish Lack, M.D.     Additional labs:   Scheduled Meds: . sodium chloride   Intravenous Once  . dexamethasone  4 mg Oral BID WC  . famciclovir  250 mg Oral Daily  . heparin  5,000 Units Subcutaneous Q8H  . insulin aspart  0-5 Units Subcutaneous QHS  . insulin aspart  0-9 Units Subcutaneous TID WC  . insulin aspart  4 Units Subcutaneous TID WC  . insulin detemir  10 Units Subcutaneous QHS  . polyethylene glycol  17 g Oral Daily  . senna  2 tablet Oral Daily  . sodium chloride  3 mL Intravenous Q12H   Continuous Infusions: . sodium chloride 75 mL/hr at 02/24/13 0549    Principal Problem:   Hypercalcemia Active Problems:   Multiple myeloma   Anemia   Dehydration   Unspecified constipation   Chronic kidney disease (CKD), stage II (mild)    Time spent: 25 minutes.    [facility]  [facility] Pager [phone].   If 8PM-8AM, please contact night-coverage at [email or web address], password  [facility] 02/24/2013, 12:28 PM  LOS: 3 days

## 2013-02-25 ENCOUNTER — Ambulatory Visit
Admission: RE | Admit: 2013-02-25 | Discharge: 2013-02-25 | Disposition: A | Discharge status: Home / Self Care | Payer: BC Managed Care – PPO | Source: Ambulatory Visit | Attending: Radiation Oncology | Admitting: Radiation Oncology

## 2013-02-25 DIAGNOSIS — M549 Dorsalgia, unspecified: Secondary | ICD-10-CM

## 2013-02-25 LAB — CBC
HCT: 26.5 % — ABNORMAL LOW (ref 39.0–52.0)
Hemoglobin: 9.3 g/dL — ABNORMAL LOW (ref 13.0–17.0)
MCH: 31.7 pg (ref 26.0–34.0)
MCHC: 35.1 g/dL (ref 30.0–36.0)
RDW: 12.4 % (ref 11.5–15.5)

## 2013-02-25 LAB — COMPREHENSIVE METABOLIC PANEL
ALT: 15 U/L (ref 0–53)
AST: 18 U/L (ref 0–37)
Alkaline Phosphatase: 62 U/L (ref 39–117)
CO2: 28 mEq/L (ref 19–32)
GFR calc Af Amer: 73 mL/min — ABNORMAL LOW (ref >90)
GFR calc non Af Amer: 63 mL/min — ABNORMAL LOW (ref >90)
Glucose, Bld: 109 mg/dL — ABNORMAL HIGH (ref 70–99)
Potassium: 3.6 mEq/L (ref 3.5–5.1)
Sodium: 143 mEq/L (ref 135–145)

## 2013-02-25 LAB — KAPPA/LAMBDA LIGHT CHAINS: Lambda free light chains: 1.07 mg/dL (ref 0.57–2.63)

## 2013-02-25 LAB — GLUCOSE, CAPILLARY
Glucose-Capillary: 180 mg/dL — ABNORMAL HIGH (ref 70–99)
Glucose-Capillary: 82 mg/dL (ref 70–99)

## 2013-02-25 MED ORDER — FUROSEMIDE 10 MG/ML IJ SOLN
40.0000 mg | Freq: Once | INTRAMUSCULAR | Status: AC
  Administered 2013-02-25: 40 mg via INTRAVENOUS
  Filled 2013-02-25: qty 4

## 2013-02-25 MED ORDER — SODIUM CHLORIDE 0.9 % IV SOLN
16.0000 mg | Freq: Once | INTRAVENOUS | Status: AC
  Administered 2013-02-25: 16 mg via INTRAVENOUS
  Filled 2013-02-25: qty 8

## 2013-02-25 MED ORDER — PROCHLORPERAZINE EDISYLATE 5 MG/ML IJ SOLN
10.0000 mg | Freq: Four times a day (QID) | INTRAMUSCULAR | Status: DC | PRN
  Administered 2013-02-25: 10 mg via INTRAVENOUS
  Filled 2013-02-25: qty 2

## 2013-02-25 MED ORDER — ONDANSETRON HCL 4 MG PO TABS
4.0000 mg | ORAL_TABLET | Freq: Once | ORAL | Status: AC
  Administered 2013-02-25: 4 mg via ORAL
  Filled 2013-02-25: qty 1

## 2013-02-25 MED ORDER — ONDANSETRON HCL 4 MG/2ML IJ SOLN
INTRAMUSCULAR | Status: AC
  Filled 2013-02-25: qty 2

## 2013-02-25 NOTE — Progress Notes (Signed)
[facility] Radiation Oncology Dept Therapy Treatment Record Phone [phone]   Radiation Therapy was administered to Mark [name] on: 02/25/2013  2:39 PM and was treatment #13 out of a planned course of14treatments.

## 2013-02-25 NOTE — Progress Notes (Signed)
He got his first dose of chemotherapy on Saturday. He tolerated this okay. He is having some nausea and vomiting. I will add Compazine.  His calcium is 11.8 today. I will increase his IV fluids. He has one more dose of calcitonin.  He still has radiation to complete. He gets his last radiation treatment tomorrow.  He is out of bed. He is urinating well. He's having no diarrhea or constipation.  His vital signs all look good. No specific findings on physical exam. Lungs are clear. Cardiac exam regular in rhythm with no murmurs rubs or bruits. Abdomen is soft. Bowel sounds were decent. Extremities shows no clubbing cyanosis or edema.  I would probably keep him one more day. That way he will finish his radiation. We can make sure that his calcium stays down. We will also see how his nausea and vomiting respond to Compazine.  Mark [name]  Psalm 31:14

## 2013-02-25 NOTE — Progress Notes (Signed)
[facility] PROGRESS NOTE  Mark [name] ION:[id number] DOB: 1971/03/09 DOA: 02/21/2013 PCP: No primary provider on file.  Brief narrative 42 year old male with history of multiple myeloma (right lumbosacral mass confirmed as plasmacytoma), on radiation therapy to the lumbosacral mass, admitted to the [facility] on 02/21/2013 with complaints of generalized weakness, polyuria and polydipsia. In the ED, serum calcium 17.7, hemoglobin 10.9 and creatinine 1.3. Hospitalist admission was requested.  Assessment/Plan: 1. Hypercalcemia: Most likely secondary to multiple myeloma.  Patient aggressively hydrated and currently seems euvolemic-we'll reduce IVF. Patient status post zoledronic acid x1 and calcitonin x3. Continue steroids. Oncology input appreciated started chemotherapy 7/26. Calcium continues to decrease. IV fluids increased by oncology and receiving the dose of Lasix-agree 2. Multiple myeloma: Oncology input appreciated. Port-A-Cath placed 7/25 and started chemotherapy 7/26. Oncology following. 3. Anemia/pancytopenia: Secondary to multiple myeloma. Hemoglobin is dropped from 10.1 > 8.5-possibly dilutional. Transfuse if hemoglobin less than 8 g per DL. Hemoglobin stable. 4. Dehydration: secondary to #1. Improved. Seems euvolemic. 5. Constipation: Secondary to hypercalcemia and pain medications. Bowel regimen started. Patient had a good BM after enema on 7/26 and BM on 7/27. 6. Stage II chronic kidney disease: Creatinine probably at baseline. 7. Chronic low back pain/lumbosacral mass: No neurological deficits. Pain management and continued radiation treatment-completes last treatment on 7/29. Patient also has some pain at the Port-A-Cath site-better. 8. Steroid induced Hyperglycemia: last A1C: 5.2. Continue current insulins: Levemir, Novolog meal & SSI. Good IP control. 9. Nausea and vomiting: Unclear etiology.? Secondary to chemotherapy. Antiemetics and monitor. Resolved.  Code  Status: Full Family Communication: Discussed with spouse at bedside. Disposition Plan: Home when medically stable-possibly 7/29.   Consultants:  Oncology  Interventional radiology  Procedures:  Port-A-Cath insertion 7/25  Antibiotics:  None   HPI/Subjective: Patient did not complain of nausea or vomiting this M.D. Patient complaining of pain across lower back this morning. Had BM yesterday.  Objective: Filed Vitals:   02/24/13 0653 02/24/13 1343 02/24/13 2210 02/25/13 0617  BP: 134/88 154/83 127/70 123/76  Pulse: 78 101 93 91  Temp: 98.4 F (36.9 C) 99.3 F (37.4 C) 99.2 F (37.3 C) 98.5 F (36.9 C)  TempSrc: Oral Oral Oral Oral  Resp: 18 16 16 16   Height:      Weight: 87.6 kg (193 lb 2 oz)   84.188 kg (185 lb 9.6 oz)  SpO2: 100% 100% 99% 100%    Intake/Output Summary (Last 24 hours) at 02/25/13 1711 Last data filed at 02/25/13 1700  Gross per 24 hour  Intake 2991.67 ml  Output      0 ml  Net 2991.67 ml   Filed Weights   02/23/13 0640 02/24/13 0653 02/25/13 0617  Weight: 84.3 kg (185 lb 13.6 oz) 87.6 kg (193 lb 2 oz) 84.188 kg (185 lb 9.6 oz)    Exam:   General exam: Comfortable. Mucosa moist.  Respiratory system: Clear. No increased work of breathing. Porta cath right upper chest. Site looks clean, dry and intact.  Cardiovascular system: S1 & S2 heard, RRR. No JVD, murmurs, gallops, clicks or pedal edema.  Gastrointestinal system: Abdomen is nondistended, soft and nontender. Normal bowel sounds heard.  Central nervous system: Alert and oriented. No focal neurological deficits.  Extremities: Symmetric 5 x 5 power.   Data Reviewed: Basic Metabolic Panel:  Recent Labs Lab 02/21/13 2031 02/22/13 0448 02/23/13 0520 02/24/13 0602 02/25/13 0600  NA 143 143 142 134* 143  K 3.9 4.0 4.0 3.6 3.6  CL 104 106 108  101 110  CO2 33* 33* 28 25 28   GLUCOSE 134* 132* 107* 109* 109*  BUN 19 20 20 23 22   CREATININE 1.41* 1.51* 1.37* 1.38* 1.38*  CALCIUM  >15.0* 14.8* 12.5* 13.6* 11.6*   Liver Function Tests:  Recent Labs Lab 02/21/13 1136 02/22/13 0448 02/23/13 0520 02/25/13 0600  AST 20 17 16 18   ALT 26 23 16 15   ALKPHOS 78 73 62 62  BILITOT 1.0 1.0 0.6 0.4  PROT 7.6 6.9 6.0 6.3  ALBUMIN 4.2 3.7 3.0* 3.2*   No results found for this basename: LIPASE, AMYLASE,  in the last 168 hours No results found for this basename: AMMONIA,  in the last 168 hours CBC:  Recent Labs Lab 02/21/13 0907 02/21/13 1136 02/22/13 0448 02/23/13 0520 02/24/13 0602 02/25/13 0600  WBC 5.7 6.0 5.1 3.8* 5.4 3.3*  NEUTROABS 4.7 5.2  --   --   --   --   HGB 11.3* 10.9* 10.1* 8.5* 9.4* 9.3*  HCT 33.2* 32.2* 30.0* 25.2* 26.9* 26.5*  MCV 92.7 92.8 94.0 92.6 90.6 90.4  PLT 169 169 146* 135* 174 154   Cardiac Enzymes:  Recent Labs Lab 02/21/13 1136  TROPONINI <0.30   BNP (last 3 results) No results found for this basename: PROBNP,  in the last 8760 hours CBG:  Recent Labs Lab 02/24/13 1239 02/24/13 1714 02/24/13 2241 02/25/13 0746 02/25/13 1211  GLUCAP 112* 88 124* 180* 82    No results found for this or any previous visit (from the past 240 hour(s)).   Studies: No results found.   Additional labs:   Scheduled Meds: . sodium chloride   Intravenous Once  . dexamethasone  4 mg Oral BID WC  . famciclovir  250 mg Oral Daily  . famotidine  20 mg Oral BID  . heparin  5,000 Units Subcutaneous Q8H  . insulin aspart  0-5 Units Subcutaneous QHS  . insulin aspart  0-9 Units Subcutaneous TID WC  . insulin aspart  4 Units Subcutaneous TID WC  . insulin detemir  10 Units Subcutaneous QHS  . ondansetron      . polyethylene glycol  17 g Oral Daily  . senna  2 tablet Oral Daily  . sodium chloride  3 mL Intravenous Q12H   Continuous Infusions: . sodium chloride 125 mL/hr at 02/25/13 1610    Principal Problem:   Hypercalcemia Active Problems:   Multiple myeloma   Anemia   Dehydration   Unspecified constipation   Chronic kidney  disease (CKD), stage II (mild)    Time spent: 25 minutes.    [facility]  [facility] Pager [phone].   If 8PM-8AM, please contact night-coverage at [email or web address], password [facility] 02/25/2013, 5:11 PM  LOS: 4 days

## 2013-02-26 ENCOUNTER — Ambulatory Visit
Admit: 2013-02-26 | Discharge: 2013-02-26 | Disposition: A | Discharge status: Home / Self Care | Payer: BC Managed Care – PPO | Attending: Radiation Oncology | Admitting: Radiation Oncology

## 2013-02-26 ENCOUNTER — Ambulatory Visit
Admission: RE | Admit: 2013-02-26 | Discharge: 2013-02-26 | Disposition: A | Discharge status: Home / Self Care | Payer: BC Managed Care – PPO | Source: Ambulatory Visit | Attending: Radiation Oncology | Admitting: Radiation Oncology

## 2013-02-26 VITALS — BP 139/88 | HR 98 | Temp 99.0°F | Resp 20 | Wt 185.7 lb

## 2013-02-26 DIAGNOSIS — C9000 Multiple myeloma not having achieved remission: Secondary | ICD-10-CM

## 2013-02-26 LAB — BASIC METABOLIC PANEL
GFR calc Af Amer: 67 mL/min — ABNORMAL LOW (ref >90)
GFR calc non Af Amer: 58 mL/min — ABNORMAL LOW (ref >90)
Glucose, Bld: 98 mg/dL (ref 70–99)
Potassium: 3.4 mEq/L — ABNORMAL LOW (ref 3.5–5.1)
Sodium: 145 mEq/L (ref 135–145)

## 2013-02-26 LAB — CBC
Hemoglobin: 9.9 g/dL — ABNORMAL LOW (ref 13.0–17.0)
MCHC: 35 g/dL (ref 30.0–36.0)

## 2013-02-26 LAB — GLUCOSE, CAPILLARY
Glucose-Capillary: 113 mg/dL — ABNORMAL HIGH (ref 70–99)
Glucose-Capillary: 80 mg/dL (ref 70–99)

## 2013-02-26 MED ORDER — INSULIN DETEMIR 100 UNIT/ML ~~LOC~~ SOLN: 10.0000 [IU] | Freq: Every day | SUBCUTANEOUS | Status: DC

## 2013-02-26 MED ORDER — INSULIN ASPART 100 UNIT/ML ~~LOC~~ SOLN: 0.0000 [IU] | Freq: Three times a day (TID) | SUBCUTANEOUS | Status: DC

## 2013-02-26 MED ORDER — FAMCICLOVIR 500 MG PO TABS: 250.0000 mg | ORAL_TABLET | Freq: Every day | ORAL | Status: DC

## 2013-02-26 MED ORDER — ACETAMINOPHEN 500 MG PO TABS: 500.0000 mg | ORAL_TABLET | Freq: Four times a day (QID) | ORAL | Status: DC | PRN

## 2013-02-26 MED ORDER — HEPARIN SOD (PORK) LOCK FLUSH 100 UNIT/ML IV SOLN
500.0000 [IU] | INTRAVENOUS | Status: AC | PRN
  Administered 2013-02-26: 500 [IU]
  Filled 2013-02-26: qty 5

## 2013-02-26 MED ORDER — FAMOTIDINE 20 MG PO TABS: 20.0000 mg | ORAL_TABLET | Freq: Two times a day (BID) | ORAL | Status: DC

## 2013-02-26 MED ORDER — SENNA 8.6 MG PO TABS: 2.0000 | ORAL_TABLET | Freq: Every day | ORAL | Status: DC

## 2013-02-26 MED ORDER — POLYETHYLENE GLYCOL 3350 17 G PO PACK: 17.0000 g | PACK | Freq: Every day | ORAL | Status: DC

## 2013-02-26 MED ORDER — POTASSIUM CHLORIDE CRYS ER 20 MEQ PO TBCR
40.0000 meq | EXTENDED_RELEASE_TABLET | Freq: Once | ORAL | Status: AC
  Administered 2013-02-26: 40 meq via ORAL
  Filled 2013-02-26: qty 2

## 2013-02-26 MED ORDER — DEXAMETHASONE 4 MG PO TABS: 2.0000 mg | ORAL_TABLET | Freq: Two times a day (BID) | ORAL | Status: DC

## 2013-02-26 NOTE — Progress Notes (Addendum)
Pt completed treatment today to [facility]. He is inpatient but states he is d/c today. Pt states his lower back pain is well controlled w/Hydrocodone. He takes 1-2 x daily. Discussed constipation prervention. He states he was nauseated 3 days ago, none since. He has medication for nausea. Pt staets his appetite "comes and goes". He is fatigued. Gave pt 1 month fu card.

## 2013-02-26 NOTE — Progress Notes (Signed)
[facility] Radiation Oncology Dept Therapy Treatment Record Phone [phone]   Radiation Therapy was administered to Mark [name] on: 02/26/2013  1:33 PM and was treatment # 14out of a planned course of 14 treatments.

## 2013-02-26 NOTE — Progress Notes (Signed)
Patient and his wife given d/c instructions and they demonstrated understanding using teach back method.  Patient understands follow up care and when he would need to call MD.  Pt with several prescriptions sent to [facility] pharmacy and they were concerned about cost.  I called the pharmacy and all of the meds will be free with the exception of the insulin which will be $45.  They were okay with this cost.  Allayne Butcher [facility]  02/26/2013

## 2013-02-26 NOTE — Discharge Summary (Signed)
Physician Discharge Summary  Mark [name] WJX:[id number] DOB: 09-Nov-1970 DOA: 02/21/2013  PCP: No primary provider on file.  Admit date: 02/21/2013 Discharge date: 02/26/2013  Time spent: Greater than 30 minutes  Recommendations for Outpatient Follow-up:  1. Dr. Arlan Organ, Oncology on 03/06/2013 at 11:45 AM with repeat labs (CBC & CMP) 2. Dr. Antony Blackbird, Radiation Oncology on 03/28/13 at 9:30 AM. 3. Closely monitor DM control and adjust insulins appropriately, especially if steroids are being tapered to DC.  Discharge Diagnoses:  Principal Problem:   Hypercalcemia Active Problems:   Multiple myeloma   Anemia   Dehydration   Unspecified constipation   Chronic kidney disease (CKD), stage II (mild)   Discharge Condition: Improved & Stable  Diet recommendation: Heart healthy and diabetic diet.  Filed Weights   02/24/13 0653 02/25/13 0617 02/26/13 0645  Weight: 87.6 kg (193 lb 2 oz) 84.188 kg (185 lb 9.6 oz) 84.6 kg (186 lb 8.2 oz)    History of present illness:  42 year old male with history of multiple myeloma (right lumbosacral mass confirmed as plasmacytoma), on radiation therapy to the lumbosacral mass, admitted to the [facility] on 02/21/2013 with complaints of generalized weakness, polyuria and polydipsia. In the ED, serum calcium 17.7, hemoglobin 10.9 and creatinine 1.3. Hospitalist admission was requested  Hospital Course:  1. Hypercalcemia: Most likely secondary to multiple myeloma. Patient was aggressively hydrated. Patient received zoledronic acid x1 and calcitonin x3. Per patient and spouse, he has been on Decadron 4 mg bid since approximately 02/07/2013. Same was continued in hospital. He received a dose of lasix too. Oncology consulted and started chemotherapy on 7/26. Calcium continues to steadily decrease- uncorrected 11.9 on day of discharge. OP close follow up with Oncology on discharge. Since patient has been on steroids for greater than 2 weeks, will  gradually taper rather than abruptly discontinue to avoid adrenal insufficiency. 2. Multiple myeloma- Kappa light chain, with multiple cytogenetic abnormalities/Lumbosacral plasmacytoma: Oncology consulted. Port-A-Cath placed 7/25 and started chemotherapy (bortezomib, cyclophosphamide and dexamethasone: day 1 cycle 1) 7/26. Oncology will continue to closely manage as OP. He completed course of radiation for lumbosacral plasmacytoma on 02/26/13. 3. Anemia/pancytopenia: Secondary to multiple myeloma. Stable. Transfuse if hemoglobin less than 8 g per DL.  4. Dehydration: secondary to #1. Resolved after IVF. 5. Constipation: Secondary to hypercalcemia and pain medications. Bowel regimen started. Patient had a good BM after enema on 7/26 and BM on 7/27. 6. Stage II chronic kidney disease: Creatinine probably at baseline. 7. Chronic low back pain/lumbosacral mass: No neurological deficits. Pain management and completed last treatment on 7/29.  8. Steroid induced Hyperglycemia: last A1C: 5.2. Continue SSI. Levemir added. Insulins may have to be adjusted as steroids are tapered off. 9. Nausea and vomiting: Unclear etiology.? Secondary to chemotherapy. Antiemetics and monitor. Resolved.  Procedures:  Port a Cath placement  Radiation treatment  Consultations:  Medical Oncology  Radiation Oncology.  IR  Discharge Exam:  Complaints: Mild intermittent low back pain. No nausea, vomiting or abdominal pain.  Filed Vitals:   02/25/13 0617 02/25/13 2206 02/26/13 0645 02/26/13 1457  BP: 123/76 134/87 136/77 140/78  Pulse: 91 100 96 103  Temp: 98.5 F (36.9 C) 98.4 F (36.9 C) 97.9 F (36.6 C) 98.3 F (36.8 C)  TempSrc: Oral Oral Oral Oral  Resp: 16 18 18 20   Height:      Weight: 84.188 kg (185 lb 9.6 oz)  84.6 kg (186 lb 8.2 oz)   SpO2: 100% 99% 100% 100%  General exam: Comfortable. Mucosa moist.   Respiratory system: Clear. No increased work of breathing. Porta cath right upper chest.  Site looks clean, dry and intact.   Cardiovascular system: S1 & S2 heard, RRR. No JVD, murmurs, gallops, clicks or pedal edema.   Gastrointestinal system: Abdomen is nondistended, soft and nontender. Normal bowel sounds heard.   Central nervous system: Alert and oriented. No focal neurological deficits.   Extremities: Symmetric 5 x 5 power.   Discharge Instructions      Discharge Orders   Future Appointments Provider Department Dept Phone   03/06/2013 11:15 AM Rachael Fee [facility] [phone]   03/06/2013 11:45 AM [name], MD [facility] [phone]   03/06/2013 12:45 PM [facility] [phone]   03/28/2013 9:30 AM Billie Lade, MD [facility] [phone]   Future Orders Complete By Expires     Call MD for:  difficulty breathing, headache or visual disturbances  As directed     Call MD for:  extreme fatigue  As directed     Call MD for:  persistant dizziness or light-headedness  As directed     Call MD for:  persistant nausea and vomiting  As directed     Call MD for:  redness, tenderness, or signs of infection (pain, swelling, redness, odor or green/yellow discharge around incision site)  As directed     Call MD for:  severe uncontrolled pain  As directed     Call MD for:  temperature >100.4  As directed     Diet - low sodium heart healthy  As directed     Diet Carb Modified  As directed     Increase activity slowly  As directed     TREATMENT CONDITIONS  As directed     Comments:      Notify the MD for the following lab values: ANC < 1500, PLT < 100K, Hemoglobin < 8.5, Creatinine > 1.5, urine output < 200 ml prior to cisplatin.  If labs are abnormal OR no lab data is available, MD must be notified and order obtained to begin chemotherapy.        Medication List         acetaminophen 500 MG tablet  Commonly known as:  TYLENOL   Take 1 tablet (500 mg total) by mouth every 6 (six) hours as needed for pain (Mild pain.).     cyclobenzaprine 10 MG tablet  Commonly known as:  FLEXERIL  Take 1 tablet (10 mg total) by mouth 2 (two) times daily as needed for muscle spasms.     dexamethasone 4 MG tablet  Commonly known as:  DECADRON  Take 0.5 tablets (2 mg total) by mouth 2 (two) times daily with a meal.     famciclovir 500 MG tablet  Commonly known as:  FAMVIR  Take 0.5 tablets (250 mg total) by mouth daily.     famotidine 20 MG tablet  Commonly known as:  PEPCID  Take 1 tablet (20 mg total) by mouth 2 (two) times daily.     HYDROcodone-acetaminophen 5-325 MG per tablet  Commonly known as:  NORCO  Take 1 tablet by mouth every 4 (four) hours as needed for pain.     insulin aspart 100 UNIT/ML injection  Commonly known as:  novoLOG  - Inject 0-9 Units into the skin 3 (three) times daily  with meals. CBG < 70: eat or drink something sweet and recheck blood sugar,   - CBG 70 - 120: 0 units  - CBG 121 - 150: 1 unit  - CBG 151 - 200: 2 units  - CBG 201 - 250: 3 units  - CBG 251 - 300: 5 units  - CBG 301 - 350: 7 units  - CBG 351 - 400: 9 units  - CBG > 400: call MD     insulin detemir 100 UNIT/ML injection  Commonly known as:  LEVEMIR  Inject 0.1 mLs (10 Units total) into the skin at bedtime.     polyethylene glycol packet  Commonly known as:  MIRALAX / GLYCOLAX  Take 17 g by mouth daily.     prochlorperazine 10 MG tablet  Commonly known as:  COMPAZINE  Take 1 tablet (10 mg total) by mouth every 6 (six) hours as needed (Nausea or vomiting).     senna 8.6 MG Tabs  Commonly known as:  SENOKOT  Take 2 tablets (17.2 mg total) by mouth daily.          The results of significant diagnostics from this hospitalization (including imaging, microbiology, ancillary and laboratory) are listed below for reference.    Significant Diagnostic Studies: Dg Chest 2 View  02/21/2013   *RADIOLOGY REPORT*   Clinical Data: Back pain and weakness  CHEST - 2 VIEW  Comparison: 02/01/2013  Findings: The heart and pulmonary vascularity are within normal limits.  The lungs are clear bilaterally.  No acute bony abnormality is seen.  Some old rib fractures are noted on the right.  IMPRESSION: No acute abnormality noted   Original Report Authenticated By: Alcide Clever, M.D.   Dg Chest 2 View  02/01/2013   *RADIOLOGY REPORT*  Clinical Data: Right lower back pain, constipation  CHEST - 2 VIEW  Comparison: 11/02/2012  Findings: Lungs are clear.  No pleural effusion or pneumothorax.  Cardiomediastinal silhouette is within normal limits.  Visualized osseous structures are within normal limits.  IMPRESSION: No evidence of acute cardiopulmonary disease.   Original Report Authenticated By: Charline Bills, M.D.   Mr Lumbar Spine Wo Contrast  02/01/2013   *RADIOLOGY REPORT*  Clinical Data: Low back pain.  Fall.  Lytic lesion in lumbar vertebral pedicle.  MRI LUMBAR SPINE WITHOUT CONTRAST  Technique:  Multiplanar and multiecho pulse sequences of the lumbar spine were obtained without intravenous contrast.  Comparison: 02/01/2013  Findings: The lytic mass involving the right pedicle, transverse process, and body of L5 measures 6.2 x 6.4 by 4.7 cm and has intermediate to low T1 signal characteristics and high T2 signal characteristics.  A similar signal characteristics are present in a 2.0 by 1.9 cm vertebral body lesion at the T12 level.  There are innumerable speckled T2 signal hyperintense lesions throughout the visualized lumbar spine and upper sacrum measuring between one and 8 mm also with similar imaging characteristics.  Visualized portions the kidneys appear unremarkable and no adenopathy in the visualized portion of the retroperitoneum noted.  No significant vertebral subluxation.  The pedicles in the lumbar spine are mildly congenitally narrow.  I do not see a definite intradural lesion, although IV contrast was not  administered.  Additional findings at individual levels are as follows:  L1-2:  No impingement.  L2-3:  Borderline subarticular lateral recess stenosis bilaterally due to mild facet arthropathy.  L3-4:  Mild left and borderline right subarticular lateral recess stenosis due to minimal disc bulge and facet arthropathy.  L4-5:  Poor definition of the right L4 nerve separate from the mass arising from the right L5 vertebra in the lateral extraforaminal space.  The right L5 nerve roots abut the tumor from the expanded right L5 pedicle in the lateral extraforaminal space.  Disc bulge noted.  L5-S1:  The right L5 nerve appears partially encased by the right- sided tumor.  IMPRESSION:  1.  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  Dominant 6.4 cm expansile mass of the right L5 pedicle, transverse process, and body.  This mass at least partially encases the right L5 spinal nerve and abuts the right L4 nerve in the lateral extraforaminal space. 2.  Mild left subarticular lateral recess stenosis at L3-4 due to disc bulge and facet arthropathy.   Original Report Authenticated By: Gaylyn Rong, M.D.   US Renal  02/02/2013   *RADIOLOGY REPORT*  Clinical Data: Hypercalcemia, question renal lesion, unable to have CT with contrast due to renal dysfunction  RENAL/URINARY TRACT ULTRASOUND COMPLETE  Comparison:  CT abdomen and pelvis 05/24/2010  Findings:  Right Kidney:  13.1 cm length.  Normal cortical thickness and echogenicity.  No mass, hydronephrosis or shadowing calcification. No perinephric fluid.  Left Kidney:  12.2 cm length.  Normal cortical thickness and echogenicity.  Small extrarenal pelvis.  No mass, hydronephrosis or shadowing calcification.  No perinephric fluid.  Bladder:  Normal appearance with a calculated prevoid volume of 160 ml.  IMPRESSION: Unremarkable renal ultrasound.   Original Report Authenticated By:  Ulyses Southward, M.D.   Ir Fluoro Guide Cv Line Right  02/22/2013   *RADIOLOGY REPORT*  Clinical Data: Recent diagnosis of multiple myeloma.  The patient requires a Port-A-Cath to begin chemotherapy.  IMPLANTED PORT A CATH PLACEMENT WITH ULTRASOUND AND FLUOROSCOPIC GUIDANCE  Sedation:  3.0 mg IV Versed; 150 mcg IV Fentanyl.  Total Moderate Sedation Time:  45 minutes.  Additional Medications:  2 grams IV Ancef.  As antibiotic prophylaxis, Ancef was ordered pre-procedure and administered intravenously within one hour of incision.  Fluoroscopy Time:  30 seconds.  Procedure:  The procedure, risks, benefits, and alternatives were explained to the patient.  Questions regarding the procedure were encouraged and answered.  The patient understands and consents to the procedure.  The right neck and chest were prepped with chlorhexidine in a sterile fashion, and a sterile drape was applied covering the operative field.  Maximum barrier sterile technique with sterile gowns and gloves were used for the procedure.  Local anesthesia was provided with 1% lidocaine and lidocaine with epinephrine.  Ultrasound was used to confirm patency of the right internal jugular vein.  After creating a small venotomy incision, a 21 gauge needle was advanced into the right internal jugular vein under direct, real-time ultrasound guidance.  Ultrasound image documentation was performed.  After securing guidewire access, an 8 Fr dilator was placed.  A J-wire was kinked to measure appropriate catheter length.  A subcutaneous port pocket was then created along the upper chest wall utilizing sharp and blunt dissection.  Portable cautery was utilized.  The pocket was irrigated with sterile saline.  A single lumen power injectable port was chosen for placement.  The 8 Fr catheter was tunneled from the port pocket site to the venotomy incision.  The port was placed in the pocket.  External catheter was trimmed to appropriate length based on guidewire  measurement.  At the venotomy, an 8 Fr peel-away sheath was placed over a guidewire.  The catheter was then placed through the sheath and the sheath removed.  Final catheter positioning was confirmed and documented with a fluoroscopic spot image.  The port was accessed with a needle and aspirated and flushed with heparinized saline. The needle was left in place.  The venotomy and port pocket incisions were closed with subcutaneous 3-0 Monocryl and subcuticular 4-0 Vicryl.  Dermabond was applied to both incisions.  Complications: None.  No pneumothorax.  Findings:  After catheter placement, the tip lies at the cavoatrial junction.  The catheter aspirates normally and is ready for immediate use.  IMPRESSION:  Placement of single lumen port a cath via right internal jugular vein.  The catheter tip lies at the cavoatrial junction.  A power injectable port a cath was placed and is ready for immediate use.   Original Report Authenticated By: Irish Lack, M.D.   Ir US Guide Vasc Access Right  02/22/2013   *RADIOLOGY REPORT*  Clinical Data: Recent diagnosis of multiple myeloma.  The patient requires a Port-A-Cath to begin chemotherapy.  IMPLANTED PORT A CATH PLACEMENT WITH ULTRASOUND AND FLUOROSCOPIC GUIDANCE  Sedation:  3.0 mg IV Versed; 150 mcg IV Fentanyl.  Total Moderate Sedation Time:  45 minutes.  Additional Medications:  2 grams IV Ancef.  As antibiotic prophylaxis, Ancef was ordered pre-procedure and administered intravenously within one hour of incision.  Fluoroscopy Time:  30 seconds.  Procedure:  The procedure, risks, benefits, and alternatives were explained to the patient.  Questions regarding the procedure were encouraged and answered.  The patient understands and consents to the procedure.  The right neck and chest were prepped with chlorhexidine in a sterile fashion, and a sterile drape was applied covering the operative field.  Maximum barrier sterile technique with sterile gowns and gloves were used  for the procedure.  Local anesthesia was provided with 1% lidocaine and lidocaine with epinephrine.  Ultrasound was used to confirm patency of the right internal jugular vein.  After creating a small venotomy incision, a 21 gauge needle was advanced into the right internal jugular vein under direct, real-time ultrasound guidance.  Ultrasound image documentation was performed.  After securing guidewire access, an 8 Fr dilator was placed.  A J-wire was kinked to measure appropriate catheter length.  A subcutaneous port pocket was then created along the upper chest wall utilizing sharp and blunt dissection.  Portable cautery was utilized.  The pocket was irrigated with sterile saline.  A single lumen power injectable port was chosen for placement.  The 8 Fr catheter was tunneled from the port pocket site to the venotomy incision.  The port was placed in the pocket.  External catheter was trimmed to appropriate length based on guidewire measurement.  At the venotomy, an 8 Fr peel-away sheath was placed over a guidewire.  The catheter was then placed through the sheath and the sheath removed.  Final catheter positioning was confirmed and documented with a fluoroscopic spot image.  The port was accessed with a needle and aspirated and flushed with heparinized saline. The needle was left in place.  The venotomy and port pocket incisions were closed with subcutaneous 3-0 Monocryl and subcuticular 4-0 Vicryl.  Dermabond was applied to both incisions.  Complications: None.  No pneumothorax.  Findings:  After catheter placement, the tip lies at the cavoatrial junction.  The catheter aspirates normally and is ready for immediate use.  IMPRESSION:  Placement of single lumen port a cath via right internal jugular vein.  The catheter tip lies  at the cavoatrial junction.  A power injectable port a cath was placed and is ready for immediate use.   Original Report Authenticated By: Irish Lack, M.D.   Ct Biopsy  02/07/2013    *RADIOLOGY REPORT*  Clinical Data:  Myeloma  CT GUIDED RIGHT ILIAC BONE MARROW ASPIRATION AND CORE BIOPSY  Date:  02/07/2013 07:05:00  Radiologist:  M. Ruel Favors, M.D.  Medications:  2 mg Versed, 100 mcg Fentanyl  Guidance:  CT  Sedation time:  10 minutes  Contrast volume:  None.  Complications:  No immediate  PROCEDURE/FINDINGS:  Informed consent was obtained from the patient following explanation of the procedure, risks, benefits and alternatives. The patient understands, agrees and consents for the procedure. All questions were addressed.  A time out was performed.  The patient was positioned prone and noncontrast localization CT was performed of the pelvis to demonstrate the iliac marrow spaces.  Maximal barrier sterile technique utilized including caps, mask, sterile gowns, sterile gloves, large sterile drape, hand hygiene, and betadine prep.  Under sterile conditions and local anesthesia, an 11 gauge coaxial bone biopsy needle was advanced into the right iliac marrow space. Needle position was confirmed with CT imaging. Initially, bone marrow aspiration was performed. Next, the 11 gauge outer cannula was utilized to obtain a right iliac bone marrow core biopsy. Needle was removed. Hemostasis was obtained with compression. The patient tolerated the procedure well. Samples were prepared with the cytotechnologist. No immediate complications.  IMPRESSION: CT guided right iliac bone marrow aspiration and core biopsy.   Original Report Authenticated By: Judie Petit. Miles Costain, M.D.   Ct Biopsy  02/04/2013   *RADIOLOGY REPORT*  Clinical Data: Multiple bone lesions of the spine with large destructive mass involving the right paraspinous tissues and destroying part of the right L5 vertebral body.  CT GUIDED CORE BIOPSY OF RIGHT PARASPINOUS SOFT TISSUE MASS  Sedation:   2.0 mg IV Versed;  100 mcg IV Fentanyl  Total Moderate Sedation Time: 10 minutes.  Procedure:  The procedure risks, benefits, and alternatives were explained to  the patient.  Questions regarding the procedure were encouraged and answered.  The patient understands and consents to the procedure.  The lower lumbar paraspinous region was prepped with Betadine in a sterile fashion, and a sterile drape was applied covering the operative field.  A sterile gown and sterile gloves were used for the procedure.  Local anesthesia was provided with 1% Lidocaine.  CT was performed in a prone position.  A 17 gauge needle was advanced under CT guidance to the level of a right lower lumbar paraspinous mass.  Four separate 18 gauge core biopsy samples were obtained and submitted in formalin.  Complications: None  Findings: CT shows a large soft tissue mass in the right paraspinous region destroying the L5 vertebral body and measuring up to 6 cm in greatest diameter.  Multiple other smaller lytic lesions are identified in the visualized spine as well as both iliac bones and the sacrum.  Solid tissue was obtained from the paraspinous mass.  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   Original Report Authenticated By: Irish Lack, M.D.   Dg Abd 2 Views  02/01/2013   **ADDENDUM** CREATED: 02/01/2013 11:42:07  Additionally noted is a lytic lesion in the left iliac bone and a healing right lateral 7th rib fracture (likely pathologic).  **END ADDENDUM** SIGNED BY: Charline Bills, M.D.  02/01/2013   *RADIOLOGY REPORT*  Clinical Data: Abdominal pain, constipation  ABDOMEN - 2 VIEW  Comparison: CT abdomen pelvis dated 05/24/2010  Findings: Nonobstructive bowel gas pattern.  Moderate stool in the right colon.  No evidence of free air under the diaphragm on the upright view.  Lytic lesion involving the right pedicle at L5.  IMPRESSION: No evidence of small bowel obstruction or free air.  Moderate stool in the right colon.  Lytic lesion involving the right pedicle at L5.  MRI lumbar spine with/without contrast is suggested for  further evaluation.  These results were called by telephone on 02/01/2013 at 2035 hours to Dr. Radford Pax, who verbally acknowledged these results.   Original Report Authenticated By: Charline Bills, M.D.   Dg Bone Survey Met  02/06/2013   *RADIOLOGY REPORT*  Clinical Data: Possible myeloma.  METASTATIC BONE SURVEY  Comparison: CT 02/04/2013  Findings: Recent biopsy of the right paraspinous mass at L5.  There is destruction of the right L5 pedicle and lateral vertebral body compatible with an aggressive process.  In addition there are lytic lesions in the iliac bone bilaterally, best seen on the CT.  No pathologic fracture is identified.  No skull lesion is identified.  Multiple lytic lesions are seen in the skeleton, suspicious for myeloma.  This includes the spinous process of C6.  There are probable bilateral rib lesions.  There is a possible lesion in the right scapula.  There are small lytic lesions in the right humerus. There is a small lytic lesion in the left intertrochanteric femur. There is a larger slightly expansile lytic lesion in the right mid femur.  IMPRESSION: Multiple skeletal lesions are present suspicious for myeloma. These are most prominent in the pelvis and the right L5 vertebral body.  No pathologic fracture.   Original Report Authenticated By: Janeece Riggers, M.D.    Microbiology: No results found for this or any previous visit (from the past 240 hour(s)).   Labs: Basic Metabolic Panel:  Recent Labs Lab 02/22/13 0448 02/23/13 0520 02/24/13 0602 02/25/13 0600 02/26/13 0520  NA 143 142 134* 143 145  K 4.0 4.0 3.6 3.6 3.4*  CL 106 108 101 110 108  CO2 33* 28 25 28 29   GLUCOSE 132* 107* 109* 109* 98  BUN 20 20 23 22 19   CREATININE 1.51* 1.37* 1.38* 1.38* 1.47*  CALCIUM 14.8* 12.5* 13.6* 11.6* 11.9*   Liver Function Tests:  Recent Labs Lab 02/21/13 1136 02/22/13 0448 02/23/13 0520 02/25/13 0600  AST 20 17 16 18   ALT 26 23 16 15   ALKPHOS 78 73 62 62  BILITOT 1.0  1.0 0.6 0.4  PROT 7.6 6.9 6.0 6.3  ALBUMIN 4.2 3.7 3.0* 3.2*   No results found for this basename: LIPASE, AMYLASE,  in the last 168 hours No results found for this basename: AMMONIA,  in the last 168 hours CBC:  Recent Labs Lab 02/21/13 0907  02/21/13 1136 02/22/13 0448 02/23/13 0520 02/24/13 0602 02/25/13 0600 02/26/13 0520  WBC 5.7  < > 6.0 5.1 3.8* 5.4 3.3* 3.1*  NEUTROABS 4.7  --  5.2  --   --   --   --   --   HGB 11.3*  < > 10.9* 10.1* 8.5* 9.4* 9.3* 9.9*  HCT 33.2*  < > 32.2* 30.0* 25.2* 26.9* 26.5* 28.3*  MCV 92.7  < > 92.8 94.0 92.6 90.6 90.4 90.7  PLT 169  < > 169 146* 135* 174 154 176  < > = values in this interval not displayed. Cardiac  Enzymes:  Recent Labs Lab 02/21/13 1136  TROPONINI <0.30   BNP: BNP (last 3 results) No results found for this basename: PROBNP,  in the last 8760 hours CBG:  Recent Labs Lab 02/25/13 0746 02/25/13 1211 02/25/13 2204 02/26/13 0730 02/26/13 1144  GLUCAP 180* 82 100* 113* 80    Additional labs:  Beta-2 microglobulin: 3.47  IgG 744, IgA 64, IgM 15, Kappa free light chain 66.3, Lamda free light chains: 1.07  Recent PTH < 2.5 on 7.4/14 & TSH 3.673.    Signed:  Ares Tegtmeyer  [facility] 02/26/2013, 3:02 PM

## 2013-02-26 NOTE — Progress Notes (Signed)
[facility]    Radiation Oncology [address] [place]     Maryln Gottron, M.D. [place], [place] [address]               Billie Lade, M.D., Ph.D. Phone: [phone]      Molli Hazard A. Kathrynn Running, M.D. Fax: [phone]      Radene Gunning, M.D., Ph.D.         Lurline Hare, M.D.         Grayland Jack, M.D Weekly Treatment Management Note  Name: Mark [name]     MRN: [id number]        CSN: [id number] Date: 02/26/2013      DOB: 01/22/1971  CC: No primary provider on file.         No ref. provider found    Status: INPATIENT  Diagnosis: The encounter diagnosis was Multiple myeloma.  Current Dose: 35 Gy  Current Fraction: 14  Planned Dose: 35 Gy  Narrative: Mark [name] was seen today for weekly treatment management. The chart was checked and CBCT  were reviewed. Patient is still admitted but is scheduled for discharge later today. Overall he feels much better since his admission and starting chemotherapy. His pain in the back and right leg is starting to improve in addition.  Ibuprofen No current facility-administered medications for this encounter.   Current Outpatient Prescriptions  Medication Sig Dispense Refill  . LORazepam (ATIVAN) 0.5 MG tablet Take 1 tablet (0.5 mg total) by mouth every 6 (six) hours as needed (Nausea or vomiting).  30 tablet  0  . ondansetron (ZOFRAN) 8 MG tablet Take 1 tablet (8 mg total) by mouth 2 (two) times daily as needed (Nausea or vomiting).  30 tablet  1  . prochlorperazine (COMPAZINE) 10 MG tablet Take 1 tablet (10 mg total) by mouth every 6 (six) hours as needed (Nausea or vomiting).  30 tablet  1   Facility-Administered Medications Ordered in Other Encounters  Medication Dose Route Frequency Provider Last Rate Last Dose  . 0.9 %  sodium chloride infusion   Intravenous Continuous [name], MD 125 mL/hr at 02/26/13 0557    . 0.9 %  sodium chloride infusion   Intravenous Once [name], MD      . acetaminophen (TYLENOL)  tablet 650 mg  650 mg Oral Q6H PRN Elease Etienne, MD      . bisacodyl (DULCOLAX) suppository 10 mg  10 mg Rectal Daily PRN Elease Etienne, MD   10 mg at 02/24/13 1252  . docusate sodium (COLACE) capsule 50 mg  50 mg Oral QID PRN Lowella Dell, MD      . famciclovir [facility]) tablet 250 mg  250 mg Oral Daily [name], MD   250 mg at 02/25/13 1016  . famotidine (PEPCID) tablet 20 mg  20 mg Oral BID Elease Etienne, MD   20 mg at 02/25/13 1000  . heparin injection 5,000 Units  5,000 Units Subcutaneous Q8H Marinda Elk, MD   5,000 Units at 02/25/13 1535  . HYDROcodone-acetaminophen (NORCO/VICODIN) 5-325 MG per tablet 1-2 tablet  1-2 tablet Oral Q4H PRN Elease Etienne, MD   1 tablet at 02/24/13 1637  . insulin aspart (novoLOG) injection 0-5 Units  0-5 Units Subcutaneous QHS Elease Etienne, MD      . insulin aspart (novoLOG) injection 0-9 Units  0-9 Units Subcutaneous TID WC Elease Etienne, MD   2 Units at 02/25/13 1743  .  insulin aspart (novoLOG) injection 4 Units  4 Units Subcutaneous TID WC Marinda Elk, MD   4 Units at 02/26/13 [phone]  . insulin detemir (LEVEMIR) injection 10 Units  10 Units Subcutaneous QHS Marinda Elk, MD   10 Units at 02/21/13 2206  . morphine 2 MG/ML injection 2 mg  2 mg Intravenous Q4H PRN Lowella Dell, MD   2 mg at 02/25/13 0825  . polyethylene glycol (MIRALAX / GLYCOLAX) packet 17 g  17 g Oral Daily Elease Etienne, MD   17 g at 02/23/13 0952  . prochlorperazine (COMPAZINE) injection 10 mg  10 mg Intravenous Q6H PRN [name], MD   10 mg at 02/25/13 0810  . senna (SENOKOT) tablet 17.2 mg  2 tablet Oral Daily Elease Etienne, MD   17.2 mg at 02/25/13 1016  . sodium chloride 0.9 % injection 10 mL  10 mL Intracatheter PRN [name], MD      . sodium chloride 0.9 % injection 3 mL  3 mL Intravenous Q12H Marinda Elk, MD   3 mL at 02/26/13 1022  . sodium chloride 0.9 % injection 3 mL  3 mL Intravenous PRN [name], MD      . traMADol Janean Sark) tablet 50 mg  50 mg Oral Q6H PRN Lowella Dell, MD       Labs:  Lab Results  Component Value Date   WBC 3.1* 02/26/2013   HGB 9.9* 02/26/2013   HCT 28.3* 02/26/2013   MCV 90.7 02/26/2013   PLT 176 02/26/2013   Lab Results  Component Value Date   CREATININE 1.47* 02/26/2013   BUN 19 02/26/2013   NA 145 02/26/2013   K 3.4* 02/26/2013   CL 108 02/26/2013   CO2 29 02/26/2013   Lab Results  Component Value Date   ALT 15 02/25/2013   AST 18 02/25/2013   PHOS 3.7 02/04/2013   BILITOT 0.4 02/25/2013    Physical Examination:  weight is 185 lb 11.2 oz (84.233 kg). His oral temperature is 99 F (37.2 C). His blood pressure is 139/88 and his pulse is 98. His respiration is 20.    Wt Readings from Last 3 Encounters:  02/26/13 186 lb 8.2 oz (84.6 kg)  02/26/13 185 lb 11.2 oz (84.233 kg)  02/21/13 187 lb (84.823 kg)     Lungs - Normal respiratory effort, chest expands symmetrically. Lungs are clear to auscultation, no crackles or wheezes.  Heart has regular rhythm and rate  Abdomen is soft and non tender with normal bowel sounds No significant skin reaction in the treatment area.  Assessment:  Patient tolerating treatments well  Plan: He'll be scheduled for followup in one month.

## 2013-02-26 NOTE — Progress Notes (Signed)
Mark [name] is doing well this morning. Not much in the way of nausea. He is out of bed. Pain control is good. His appetite is okay. His urinating well. His calcium is 11.9. This is holding stable.  Has one more dose of radiation today.  His vital signs are stable. Blood pressure 136/77. Lungs are clear bilaterally. Cardiac exam regular rate and rhythm with no murmurs rubs or bruits. Abdomen soft. Has good bowel sounds. There is no palpable abdominal mass. There is a palpable hepato- splenomegaly. Extremities shows no clubbing cyanosis or edema. Has good strength in his legs. Skin exam no rashes. Neurological exam no focal neurological deficits.  Laboratory studies White cell count is 3.1 hemoglobin 9.9 platelet count 176. Calcium is 11.9. BUN 19 creatinine 1.47.  I suspect she will be going home today. He's ready to go home.  We will see him back on August 1 for chemotherapy.  Pete E.  Romans 1:17

## 2013-02-27 ENCOUNTER — Encounter: Payer: Self-pay | Admitting: Radiation Oncology

## 2013-02-27 ENCOUNTER — Telehealth: Payer: Self-pay | Admitting: Hematology & Oncology

## 2013-02-27 ENCOUNTER — Other Ambulatory Visit: Payer: Self-pay | Admitting: Hematology & Oncology

## 2013-02-27 DIAGNOSIS — C9000 Multiple myeloma not having achieved remission: Secondary | ICD-10-CM

## 2013-02-27 NOTE — Telephone Encounter (Signed)
Left message to call that Dr. Myna Hidalgo wants to see him Friday 8-1

## 2013-02-27 NOTE — Progress Notes (Signed)
  Radiation Oncology         [phone] ________________________________  Name: Ashan Cueva MRN: [id number]  Date: 02/27/2013  DOB: [birth year]  End of Treatment Note  Diagnosis:   Multiple myeloma     Indication for treatment:  Low back pain from soft tissue mass and spine involvement      Radiation treatment dates:   July 10 through July 29  Site/dose:   Lower lumbar spine and upper sacrum  Beams/energy:   4 field set up using 15 MV photons  Narrative: The patient tolerated radiation treatment relatively well.   Towards the end of his therapy he did start to have some pain improvement  Plan: The patient has completed radiation treatment. The patient will return to radiation oncology clinic for routine followup in one month. I advised them to call or return sooner if they have any questions or concerns related to their recovery or treatment.  -----------------------------------  Billie Lade, PhD, MD

## 2013-02-27 NOTE — Progress Notes (Signed)
Discharge summary sent to payer through [facility]  

## 2013-02-27 NOTE — Progress Notes (Signed)
Radiation Oncology [phone]  ________________________________  Name: Mark [name] MRN: [id number]  Date: 02/27/2013 DOB: 28-Apr-1971   End of Treatment Note   Diagnosis: Multiple myeloma   Indication for treatment: Low back pain from soft tissue mass and spine involvement   Radiation treatment dates: July 10 through July 29   Site/dose: Lower lumbar spine and upper sacrum, 35 gray in 14 fractions  Beams/energy: 4 field set up using 15 MV photons   Narrative: The patient tolerated radiation treatment relatively well. Towards the end of his therapy he did start to have some pain improvement   Plan: The patient has completed radiation treatment. The patient will return to radiation oncology clinic for routine followup in one month. I advised them to call or return sooner if they have any questions or concerns related to their recovery or treatment.  -----------------------------------   Billie Lade, PhD, MD

## 2013-02-28 ENCOUNTER — Ambulatory Visit (HOSPITAL_BASED_OUTPATIENT_CLINIC_OR_DEPARTMENT_OTHER): Payer: BC Managed Care – PPO

## 2013-02-28 ENCOUNTER — Encounter: Payer: Self-pay | Admitting: Hematology & Oncology

## 2013-02-28 ENCOUNTER — Other Ambulatory Visit (HOSPITAL_BASED_OUTPATIENT_CLINIC_OR_DEPARTMENT_OTHER): Payer: BC Managed Care – PPO | Admitting: Lab

## 2013-02-28 ENCOUNTER — Ambulatory Visit (HOSPITAL_BASED_OUTPATIENT_CLINIC_OR_DEPARTMENT_OTHER): Payer: BC Managed Care – PPO | Admitting: Hematology & Oncology

## 2013-02-28 VITALS — BP 127/73 | HR 93 | Temp 98.3°F | Resp 18 | Ht 71.0 in | Wt 189.0 lb

## 2013-02-28 DIAGNOSIS — C9000 Multiple myeloma not having achieved remission: Secondary | ICD-10-CM

## 2013-02-28 DIAGNOSIS — Z5112 Encounter for antineoplastic immunotherapy: Secondary | ICD-10-CM

## 2013-02-28 DIAGNOSIS — Z5111 Encounter for antineoplastic chemotherapy: Secondary | ICD-10-CM

## 2013-02-28 LAB — CBC WITH DIFFERENTIAL (CANCER CENTER ONLY)
Eosinophils Absolute: 0.1 10*3/uL (ref 0.0–0.5)
HCT: 27.9 % — ABNORMAL LOW (ref 38.7–49.9)
LYMPH%: 7.4 % — ABNORMAL LOW (ref 14.0–48.0)
MCV: 93 fL (ref 82–98)
MONO#: 0.4 10*3/uL (ref 0.1–0.9)
NEUT%: 81.6 % — ABNORMAL HIGH (ref 40.0–80.0)
RBC: 3.01 10*6/uL — ABNORMAL LOW (ref 4.20–5.70)
WBC: 3.9 10*3/uL — ABNORMAL LOW (ref 4.0–10.0)

## 2013-02-28 LAB — CMP (CANCER CENTER ONLY)
CO2: 32 mEq/L (ref 18–33)
Creat: 1.6 mg/dl — ABNORMAL HIGH (ref 0.6–1.2)
Glucose, Bld: 94 mg/dL (ref 73–118)
Total Bilirubin: 1 mg/dl (ref 0.20–1.60)
Total Protein: 7 g/dL (ref 6.4–8.1)

## 2013-02-28 MED ORDER — LORAZEPAM 2 MG/ML IJ SOLN
0.5000 mg | Freq: Once | INTRAMUSCULAR | Status: AC
  Administered 2013-02-28: 0.5 mg via INTRAVENOUS

## 2013-02-28 MED ORDER — DEXAMETHASONE SODIUM PHOSPHATE 20 MG/5ML IJ SOLN
40.0000 mg | Freq: Once | INTRAMUSCULAR | Status: AC
  Administered 2013-02-28: 40 mg via INTRAVENOUS
  Filled 2013-02-28: qty 10

## 2013-02-28 MED ORDER — ZOLEDRONIC ACID 4 MG/5ML IV CONC: 4.0000 mg | Freq: Once | INTRAVENOUS | Status: DC

## 2013-02-28 MED ORDER — SODIUM CHLORIDE 0.9 % IV SOLN
300.0000 mg/m2 | Freq: Once | INTRAVENOUS | Status: AC
  Administered 2013-02-28: 620 mg via INTRAVENOUS
  Filled 2013-02-28: qty 31

## 2013-02-28 MED ORDER — CALCITONIN (SALMON) 200 UNIT/ML IJ SOLN
500.0000 [IU] | Freq: Once | INTRAMUSCULAR | Status: AC
  Administered 2013-02-28: 500 [IU] via SUBCUTANEOUS
  Filled 2013-02-28: qty 2.5

## 2013-02-28 MED ORDER — ZOLEDRONIC ACID 4 MG/100ML IV SOLN
4.0000 mg | Freq: Once | INTRAVENOUS | Status: AC
  Administered 2013-02-28: 4 mg via INTRAVENOUS
  Filled 2013-02-28: qty 100

## 2013-02-28 MED ORDER — HEPARIN SOD (PORK) LOCK FLUSH 100 UNIT/ML IV SOLN
500.0000 [IU] | Freq: Once | INTRAVENOUS | Status: DC | PRN
  Filled 2013-02-28: qty 5

## 2013-02-28 MED ORDER — ONDANSETRON 8 MG/50ML IVPB (CHCC)
8.0000 mg | Freq: Once | INTRAVENOUS | Status: AC
  Administered 2013-02-28: 8 mg via INTRAVENOUS

## 2013-02-28 MED ORDER — SODIUM CHLORIDE 0.9 % IJ SOLN
10.0000 mL | INTRAMUSCULAR | Status: DC | PRN
  Filled 2013-02-28: qty 10

## 2013-02-28 MED ORDER — BORTEZOMIB CHEMO SQ INJECTION 3.5 MG (2.5MG/ML)
1.3000 mg/m2 | Freq: Once | INTRAMUSCULAR | Status: AC
  Administered 2013-02-28: 2.75 mg via SUBCUTANEOUS
  Filled 2013-02-28: qty 2.75

## 2013-02-28 MED ORDER — SODIUM CHLORIDE 0.9 % IV SOLN
Freq: Once | INTRAVENOUS | Status: AC
  Administered 2013-02-28: 12:00:00 via INTRAVENOUS

## 2013-02-28 NOTE — Progress Notes (Signed)
This office note has been dictated.

## 2013-02-28 NOTE — Patient Instructions (Addendum)
Cyclophosphamide injection What is this medicine? CYCLOPHOSPHAMIDE (sye kloe FOSS fa mide) is a chemotherapy drug. It slows the growth of cancer cells. This medicine is used to treat many types of cancer like lymphoma, myeloma, leukemia, breast cancer, and ovarian cancer, to name a few. It is also used to treat nephrotic syndrome in children. This medicine may be used for other purposes; ask your health care provider or pharmacist if you have questions. What should I tell my health care provider before I take this medicine? They need to know if you have any of these conditions: -blood disorders -history of other chemotherapy -history of radiation therapy -infection -kidney disease -liver disease -tumors in the bone marrow -an unusual or allergic reaction to cyclophosphamide, other chemotherapy, other medicines, foods, dyes, or preservatives -pregnant or trying to get pregnant -breast-feeding How should I use this medicine? This drug is usually given as an injection into a vein or muscle or by infusion into a vein. It is administered in a hospital or clinic by a specially trained health care professional. Talk to your pediatrician regarding the use of this medicine in children. While this drug may be prescribed for selected conditions, precautions do apply. Overdosage: If you think you have taken too much of this medicine contact a poison control center or emergency room at once. NOTE: This medicine is only for you. Do not share this medicine with others. What if I miss a dose? It is important not to miss your dose. Call your doctor or health care professional if you are unable to keep an appointment. What may interact with this medicine? Do not take this medicine with any of the following medications: -mibefradil -nalidixic acid This medicine may also interact with the following medications: -doxorubicin -etanercept -medicines to increase blood counts like filgrastim, pegfilgrastim,  sargramostim -medicines that block muscle or nerve pain -St. John's Wort -phenobarbital -succinylcholine chloride -trastuzumab -vaccines Talk to your doctor or health care professional before taking any of these medicines: -acetaminophen -aspirin -ibuprofen -ketoprofen -naproxen This list may not describe all possible interactions. Give your health care provider a list of all the medicines, herbs, non-prescription drugs, or dietary supplements you use. Also tell them if you smoke, drink alcohol, or use illegal drugs. Some items may interact with your medicine. What should I watch for while using this medicine? Visit your doctor for checks on your progress. This drug may make you feel generally unwell. This is not uncommon, as chemotherapy can affect healthy cells as well as cancer cells. Report any side effects. Continue your course of treatment even though you feel ill unless your doctor tells you to stop. Drink water or other fluids as directed. Urinate often, even at night. In some cases, you may be given additional medicines to help with side effects. Follow all directions for their use. Call your doctor or health care professional for advice if you get a fever, chills or sore throat, or other symptoms of a cold or flu. Do not treat yourself. This drug decreases your body's ability to fight infections. Try to avoid being around people who are sick. This medicine may increase your risk to bruise or bleed. Call your doctor or health care professional if you notice any unusual bleeding. Be careful brushing and flossing your teeth or using a toothpick because you may get an infection or bleed more easily. If you have any dental work done, tell your dentist you are receiving this medicine. Avoid taking products that contain aspirin, acetaminophen, ibuprofen, naproxen,  or ketoprofen unless instructed by your doctor. These medicines may hide a fever. Do not become pregnant while taking this  medicine. Women should inform their doctor if they wish to become pregnant or think they might be pregnant. There is a potential for serious side effects to an unborn child. Talk to your health care professional or pharmacist for more information. Do not breast-feed an infant while taking this medicine. Men should inform their doctor if they wish to father a child. This medicine may lower sperm counts. If you are going to have surgery, tell your doctor or health care professional that you have taken this medicine. What side effects may I notice from receiving this medicine? Side effects that you should report to your doctor or health care professional as soon as possible: -allergic reactions like skin rash, itching or hives, swelling of the face, lips, or tongue -low blood counts - this medicine may decrease the number of white blood cells, red blood cells and platelets. You may be at increased risk for infections and bleeding. -signs of infection - fever or chills, cough, sore throat, pain or difficulty passing urine -signs of decreased platelets or bleeding - bruising, pinpoint red spots on the skin, black, tarry stools, blood in the urine -signs of decreased red blood cells - unusually weak or tired, fainting spells, lightheadedness -breathing problems -dark urine -mouth sores -pain, swelling, redness at site where injected -swelling of the ankles, feet, hands -trouble passing urine or change in the amount of urine -weight gain -yellowing of the eyes or skin Side effects that usually do not require medical attention (report to your doctor or health care professional if they continue or are bothersome): -changes in nail or skin color -diarrhea -hair loss -loss of appetite -missed menstrual periods -nausea, vomiting -stomach pain This list may not describe all possible side effects. Call your doctor for medical advice about side effects. You may report side effects to FDA at  1-800-FDA-1088. Where should I keep my medicine? This drug is given in a hospital or clinic and will not be stored at home. NOTE: This sheet is a summary. It may not cover all possible information. If you have questions about this medicine, talk to your doctor, pharmacist, or health care provider.  2013, Elsevier/Gold Standard. (10/23/2007 2:32:25 PM)   Bortezomib injection What is this medicine? BORTEZOMIB (bor TEZ oh mib) is a chemotherapy drug. It slows the growth of cancer cells. This medicine is used to treat multiple myeloma, lymphoma, and other cancers. This medicine may be used for other purposes; ask your health care provider or pharmacist if you have questions. What should I tell my health care provider before I take this medicine? They need to know if you have any of these conditions: -heart disease -irregular heartbeat -liver disease -low blood counts, like low white blood cells, platelets, or hemoglobin -peripheral neuropathy -taking medicine for blood pressure -an unusual or allergic reaction to bortezomib, mannitol, boron, other medicines, foods, dyes, or preservatives -pregnant or trying to get pregnant -breast-feeding How should I use this medicine? This medicine is for injection into a vein or for injection under the skin. It is given by a health care professional in a hospital or clinic setting. Talk to your pediatrician regarding the use of this medicine in children. Special care may be needed. Overdosage: If you think you have taken too much of this medicine contact a poison control center or emergency room at once. NOTE: This medicine is only for you.  Do not share this medicine with others. What if I miss a dose? It is important not to miss your dose. Call your doctor or health care professional if you are unable to keep an appointment. What may interact with this medicine? -medicines for diabetes -medicines to increase blood counts like filgrastim, pegfilgrastim,  sargramostim -zalcitabine Talk to your doctor or health care professional before taking any of these medicines: -acetaminophen -aspirin -ibuprofen -ketoprofen -naproxen This list may not describe all possible interactions. Give your health care provider a list of all the medicines, herbs, non-prescription drugs, or dietary supplements you use. Also tell them if you smoke, drink alcohol, or use illegal drugs. Some items may interact with your medicine. What should I watch for while using this medicine? Visit your doctor for checks on your progress. This drug may make you feel generally unwell. This is not uncommon, as chemotherapy can affect healthy cells as well as cancer cells. Report any side effects. Continue your course of treatment even though you feel ill unless your doctor tells you to stop. You may get drowsy or dizzy. Do not drive, use machinery, or do anything that needs mental alertness until you know how this medicine affects you. Do not stand or sit up quickly, especially if you are an older patient. This reduces the risk of dizzy or fainting spells. In some cases, you may be given additional medicines to help with side effects. Follow all directions for their use. Call your doctor or health care professional for advice if you get a fever, chills or sore throat, or other symptoms of a cold or flu. Do not treat yourself. This drug decreases your body's ability to fight infections. Try to avoid being around people who are sick. This medicine may increase your risk to bruise or bleed. Call your doctor or health care professional if you notice any unusual bleeding. Be careful brushing and flossing your teeth or using a toothpick because you may get an infection or bleed more easily. If you have any dental work done, tell your dentist you are receiving this medicine. Avoid taking products that contain aspirin, acetaminophen, ibuprofen, naproxen, or ketoprofen unless instructed by your doctor.  These medicines may hide a fever. Do not become pregnant while taking this medicine. Women should inform their doctor if they wish to become pregnant or think they might be pregnant. There is a potential for serious side effects to an unborn child. Talk to your health care professional or pharmacist for more information. Do not breast-feed an infant while taking this medicine. You may have vomiting or diarrhea while taking this medicine. Drink water or other fluids as directed. What side effects may I notice from receiving this medicine? Side effects that you should report to your doctor or health care professional as soon as possible: -allergic reactions like skin rash, itching or hives, swelling of the face, lips, or tongue -breathing problems -changes in hearing -changes in vision -fast, irregular heartbeat -feeling faint or lightheaded, falls -pain, tingling, numbness in the hands or feet -seizures -swelling of the ankles, feet, hands -unusual bleeding or bruising -unusually weak or tired -vomiting Side effects that usually do not require medical attention (report to your doctor or health care professional if they continue or are bothersome): -changes in emotions or moods -constipation -diarrhea -loss of appetite -headache -irritation at site where injected -nausea This list may not describe all possible side effects. Call your doctor for medical advice about side effects. You may report side effects  to FDA at 1-800-FDA-1088. Where should I keep my medicine? This drug is given in a hospital or clinic and will not be stored at home. NOTE: This sheet is a summary. It may not cover all possible information. If you have questions about this medicine, talk to your doctor, pharmacist, or health care provider.  2013, Elsevier/Gold Standard. (08/25/2010 11:42:36 AM)

## 2013-03-01 ENCOUNTER — Ambulatory Visit: Payer: Self-pay | Admitting: Hematology & Oncology

## 2013-03-01 ENCOUNTER — Ambulatory Visit: Payer: Self-pay

## 2013-03-01 ENCOUNTER — Other Ambulatory Visit: Payer: Self-pay | Admitting: Lab

## 2013-03-01 ENCOUNTER — Encounter: Payer: Self-pay | Admitting: Oncology

## 2013-03-01 ENCOUNTER — Encounter: Payer: Self-pay | Admitting: Radiation Oncology

## 2013-03-01 ENCOUNTER — Telehealth: Payer: Self-pay | Admitting: Hematology & Oncology

## 2013-03-01 NOTE — Telephone Encounter (Signed)
Pt brought by some [employer] forms to be completed. They have been completed and pt took them and the clinicals with him after tx.  [employer] [id number] [place] [id number] [phone] Fx [phone] Ph   COPY SCANNED

## 2013-03-01 NOTE — Progress Notes (Signed)
24 Hour Chemotherapy Follow up Call  [name] called at home following administration of chemotherapy. Patient reports NA  Medications reviewed NA   Following interventions recommended - call office if any problems arise.  Called home number and left message, called work number, no one there by that name.

## 2013-03-01 NOTE — Progress Notes (Signed)
DIAGNOSES: 1. 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 2. Hypercalcemia.  CURRENT THERAPY: 1. Velcade/Cytoxan/Decadron q.week. 2. Zometa 4 mg IV q.3 weeks.  INTERIM HISTORY:  [name] comes in for his 1st office visit.  I had seen him in the hospital on a couple occasions.  I initially saw him back on July 4th.  He also was found to have kappa light chain myeloma. 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  A bone marrow biopsy subsequently showed myeloma.  He present with hypercalcemia with a calcium level of over 15.  He has had radiation therapy to the right lumbosacral mass.  He completed this on the 29th.  His calcium has been somewhat difficult to control.  He has had Zometa a couple of times.  He has had calcitonin.  Calcium comes down nicely, but then begins to trend back upward.  He is not taking his pain medications.  He does hurt in the back.  Bone survey shows multiple skeletal lesions.  He has had no nausea or vomiting.  He has had a little bit of a headache.  He has had some constipation.  Again, he is not one who likes to take medicines.  PHYSICAL EXAMINATION:  General:  This is a fairly well-developed, well- nourished African American gentleman in no obvious distress.  Vital signs:  Temperature 98.3, pulse 93, respiratory rate 18, blood pressure 127/73.  Weight is 189.  Head:  Normocephalic, atraumatic skull.  There are no ocular or oral lesions.  There are no palpable cervical or supraclavicular lymph nodes.  Lungs:  Clear bilaterally.  Cardiac: Regular rate and rhythm with a normal S1 and S2.  There are no murmurs, rubs, or bruits.  Abdomen:  Soft.  He has good bowel sounds.  There is no fluid wave.  There is no palpable hepatosplenomegaly.  Extremities: No clubbing, cyanosis, or edema.  Strength is symmetric in his legs and arms.  Neurological:  No focal neurological deficit.  LABORATORY STUDIES:  White  cell count is 3.9, hemoglobin 9.8, hematocrit 27.9, platelet count 198.  Calcium is 12.7 with an albumin of 3.8.  BUN 20, creatinine 1.6.  IMPRESSION:  Mr. Behrend is a 42 year old gentleman with kappa light chain myeloma.  Again, he has ominous cytogenetics.  He has multiple cytogenetic abnormalities.  We will go ahead and treat him today.  We will get him on some IV fluids.  I think the best way for [place] to try to control his calcium is to treat the myeloma.  It is definitely conceivable that we may need to go with a more aggressive intervention.  He might need  chemotherapy for actual plasma cell leukemia if we do not see a good response.  We will go ahead with his Decadron today.  Again, we will give him IV fluids.  I will see if we can give him a dose of calcitonin.  We will have him come back early next week for lab work so we can monitor his calcium.  He will come back next week for his chemo.  I will plan see him back in another couple weeks or so.    ______________________________ [name], M.D. PRE/MEDQ  D:  02/28/2013  T:  03/01/2013  Job:  [id number]

## 2013-03-01 NOTE — Progress Notes (Signed)
Patient's spouse, [name], brought by [employer] requesting path report as well as itemized bills.  Printed and put in envelope for her to pick up for patient to file.

## 2013-03-04 ENCOUNTER — Other Ambulatory Visit (HOSPITAL_BASED_OUTPATIENT_CLINIC_OR_DEPARTMENT_OTHER): Payer: BC Managed Care – PPO | Admitting: Lab

## 2013-03-04 ENCOUNTER — Ambulatory Visit: Payer: Self-pay | Admitting: Hematology & Oncology

## 2013-03-04 DIAGNOSIS — C9000 Multiple myeloma not having achieved remission: Secondary | ICD-10-CM

## 2013-03-04 LAB — CMP (CANCER CENTER ONLY)
ALT(SGPT): 19 U/L (ref 10–47)
BUN, Bld: 17 mg/dL (ref 7–22)
CO2: 27 mEq/L (ref 18–33)
Calcium: 9.3 mg/dL (ref 8.0–10.3)
Creat: 1.2 mg/dl (ref 0.6–1.2)
Total Bilirubin: 1 mg/dl (ref 0.20–1.60)

## 2013-03-06 ENCOUNTER — Ambulatory Visit: Payer: Self-pay | Admitting: Hematology & Oncology

## 2013-03-06 ENCOUNTER — Ambulatory Visit: Payer: Self-pay

## 2013-03-06 ENCOUNTER — Other Ambulatory Visit: Payer: Self-pay | Admitting: Lab

## 2013-03-08 ENCOUNTER — Telehealth: Payer: Self-pay | Admitting: Hematology & Oncology

## 2013-03-08 ENCOUNTER — Ambulatory Visit: Payer: Self-pay | Admitting: Hematology & Oncology

## 2013-03-08 ENCOUNTER — Other Ambulatory Visit (HOSPITAL_BASED_OUTPATIENT_CLINIC_OR_DEPARTMENT_OTHER): Payer: BC Managed Care – PPO | Admitting: Lab

## 2013-03-08 ENCOUNTER — Ambulatory Visit (HOSPITAL_BASED_OUTPATIENT_CLINIC_OR_DEPARTMENT_OTHER): Payer: BC Managed Care – PPO

## 2013-03-08 DIAGNOSIS — Z5112 Encounter for antineoplastic immunotherapy: Secondary | ICD-10-CM

## 2013-03-08 DIAGNOSIS — C9000 Multiple myeloma not having achieved remission: Secondary | ICD-10-CM

## 2013-03-08 DIAGNOSIS — Z5111 Encounter for antineoplastic chemotherapy: Secondary | ICD-10-CM

## 2013-03-08 LAB — CMP (CANCER CENTER ONLY)
ALT(SGPT): 24 U/L (ref 10–47)
AST: 24 U/L (ref 11–38)
Albumin: 3.7 g/dL (ref 3.3–5.5)
CO2: 29 mEq/L (ref 18–33)
Calcium: 10.7 mg/dL — ABNORMAL HIGH (ref 8.0–10.3)
Chloride: 103 mEq/L (ref 98–108)
Creat: 1.3 mg/dl — ABNORMAL HIGH (ref 0.6–1.2)
Potassium: 4.2 mEq/L (ref 3.3–4.7)
Sodium: 141 mEq/L (ref 128–145)
Total Protein: 6.9 g/dL (ref 6.4–8.1)

## 2013-03-08 LAB — CBC WITH DIFFERENTIAL (CANCER CENTER ONLY)
BASO#: 0 10*3/uL (ref 0.0–0.2)
Eosinophils Absolute: 0 10*3/uL (ref 0.0–0.5)
HCT: 24.9 % — ABNORMAL LOW (ref 38.7–49.9)
HGB: 8.4 g/dL — ABNORMAL LOW (ref 13.0–17.1)
LYMPH#: 0.2 10*3/uL — ABNORMAL LOW (ref 0.9–3.3)
LYMPH%: 7.5 % — ABNORMAL LOW (ref 14.0–48.0)
MCV: 97 fL (ref 82–98)
MONO#: 0.4 10*3/uL (ref 0.1–0.9)
NEUT%: 80 % (ref 40.0–80.0)
RBC: 2.58 10*6/uL — ABNORMAL LOW (ref 4.20–5.70)
RDW: 13.9 % (ref 11.1–15.7)
WBC: 3.1 10*3/uL — ABNORMAL LOW (ref 4.0–10.0)

## 2013-03-08 MED ORDER — LORAZEPAM 2 MG/ML IJ SOLN
0.5000 mg | Freq: Once | INTRAMUSCULAR | Status: AC
  Administered 2013-03-08: 0.5 mg via INTRAVENOUS

## 2013-03-08 MED ORDER — BORTEZOMIB CHEMO SQ INJECTION 3.5 MG (2.5MG/ML)
1.3000 mg/m2 | Freq: Once | INTRAMUSCULAR | Status: AC
  Administered 2013-03-08: 2.75 mg via SUBCUTANEOUS
  Filled 2013-03-08: qty 2.75

## 2013-03-08 MED ORDER — HEPARIN SOD (PORK) LOCK FLUSH 100 UNIT/ML IV SOLN
500.0000 [IU] | Freq: Once | INTRAVENOUS | Status: AC | PRN
  Administered 2013-03-08: 500 [IU]
  Filled 2013-03-08: qty 5

## 2013-03-08 MED ORDER — SODIUM CHLORIDE 0.9 % IV SOLN
Freq: Once | INTRAVENOUS | Status: AC
  Administered 2013-03-08: 12:00:00 via INTRAVENOUS

## 2013-03-08 MED ORDER — DEXAMETHASONE SODIUM PHOSPHATE 20 MG/5ML IJ SOLN
40.0000 mg | Freq: Once | INTRAMUSCULAR | Status: AC
  Administered 2013-03-08: 40 mg via INTRAVENOUS
  Filled 2013-03-08: qty 10

## 2013-03-08 MED ORDER — SODIUM CHLORIDE 0.9 % IJ SOLN
10.0000 mL | INTRAMUSCULAR | Status: DC | PRN
  Administered 2013-03-08: 10 mL
  Filled 2013-03-08: qty 10

## 2013-03-08 MED ORDER — SODIUM CHLORIDE 0.9 % IV SOLN
300.0000 mg/m2 | Freq: Once | INTRAVENOUS | Status: AC
  Administered 2013-03-08: 620 mg via INTRAVENOUS
  Filled 2013-03-08: qty 31

## 2013-03-08 MED ORDER — ONDANSETRON 8 MG/50ML IVPB (CHCC)
8.0000 mg | Freq: Once | INTRAVENOUS | Status: AC
  Administered 2013-03-08: 8 mg via INTRAVENOUS

## 2013-03-08 NOTE — Patient Instructions (Addendum)
Cyclophosphamide injection °What is this medicine? °CYCLOPHOSPHAMIDE (sye kloe FOSS fa mide) is a chemotherapy drug. It slows the growth of cancer cells. This medicine is used to treat many types of cancer like lymphoma, myeloma, leukemia, breast cancer, and ovarian cancer, to name a few. It is also used to treat nephrotic syndrome in children. °This medicine may be used for other purposes; ask your health care provider or pharmacist if you have questions. °What should I tell my health care provider before I take this medicine? °They need to know if you have any of these conditions: °-blood disorders °-history of other chemotherapy °-history of radiation therapy °-infection °-kidney disease °-liver disease °-tumors in the bone marrow °-an unusual or allergic reaction to cyclophosphamide, other chemotherapy, other medicines, foods, dyes, or preservatives °-pregnant or trying to get pregnant °-breast-feeding °How should I use this medicine? °This drug is usually given as an injection into a vein or muscle or by infusion into a vein. It is administered in a hospital or clinic by a specially trained health care professional. °Talk to your pediatrician regarding the use of this medicine in children. While this drug may be prescribed for selected conditions, precautions do apply. °Overdosage: If you think you have taken too much of this medicine contact a poison control center or emergency room at once. °NOTE: This medicine is only for you. Do not share this medicine with others. °What if I miss a dose? °It is important not to miss your dose. Call your doctor or health care professional if you are unable to keep an appointment. °What may interact with this medicine? °Do not take this medicine with any of the following medications: °-mibefradil °-nalidixic acid °This medicine may also interact with the following medications: °-doxorubicin °-etanercept °-medicines to increase blood counts like filgrastim, pegfilgrastim,  sargramostim °-medicines that block muscle or nerve pain °-St. John's Wort °-phenobarbital °-succinylcholine chloride °-trastuzumab °-vaccines °Talk to your doctor or health care professional before taking any of these medicines: °-acetaminophen °-aspirin °-ibuprofen °-ketoprofen °-naproxen °This list may not describe all possible interactions. Give your health care provider a list of all the medicines, herbs, non-prescription drugs, or dietary supplements you use. Also tell them if you smoke, drink alcohol, or use illegal drugs. Some items may interact with your medicine. °What should I watch for while using this medicine? °Visit your doctor for checks on your progress. This drug may make you feel generally unwell. This is not uncommon, as chemotherapy can affect healthy cells as well as cancer cells. Report any side effects. Continue your course of treatment even though you feel ill unless your doctor tells you to stop. °Drink water or other fluids as directed. Urinate often, even at night. °In some cases, you may be given additional medicines to help with side effects. Follow all directions for their use. °Call your doctor or health care professional for advice if you get a fever, chills or sore throat, or other symptoms of a cold or flu. Do not treat yourself. This drug decreases your body's ability to fight infections. Try to avoid being around people who are sick. °This medicine may increase your risk to bruise or bleed. Call your doctor or health care professional if you notice any unusual bleeding. °Be careful brushing and flossing your teeth or using a toothpick because you may get an infection or bleed more easily. If you have any dental work done, tell your dentist you are receiving this medicine. °Avoid taking products that contain aspirin, acetaminophen, ibuprofen, naproxen,   or ketoprofen unless instructed by your doctor. These medicines may hide a fever. °Do not become pregnant while taking this  medicine. Women should inform their doctor if they wish to become pregnant or think they might be pregnant. There is a potential for serious side effects to an unborn child. Talk to your health care professional or pharmacist for more information. Do not breast-feed an infant while taking this medicine. °Men should inform their doctor if they wish to father a child. This medicine may lower sperm counts. °If you are going to have surgery, tell your doctor or health care professional that you have taken this medicine. °What side effects may I notice from receiving this medicine? °Side effects that you should report to your doctor or health care professional as soon as possible: °-allergic reactions like skin rash, itching or hives, swelling of the face, lips, or tongue °-low blood counts - this medicine may decrease the number of white blood cells, red blood cells and platelets. You may be at increased risk for infections and bleeding. °-signs of infection - fever or chills, cough, sore throat, pain or difficulty passing urine °-signs of decreased platelets or bleeding - bruising, pinpoint red spots on the skin, black, tarry stools, blood in the urine °-signs of decreased red blood cells - unusually weak or tired, fainting spells, lightheadedness °-breathing problems °-dark urine °-mouth sores °-pain, swelling, redness at site where injected °-swelling of the ankles, feet, hands °-trouble passing urine or change in the amount of urine °-weight gain °-yellowing of the eyes or skin °Side effects that usually do not require medical attention (report to your doctor or health care professional if they continue or are bothersome): °-changes in nail or skin color °-diarrhea °-hair loss °-loss of appetite °-missed menstrual periods °-nausea, vomiting °-stomach pain °This list may not describe all possible side effects. Call your doctor for medical advice about side effects. You may report side effects to FDA at  1-800-FDA-1088. °Where should I keep my medicine? °This drug is given in a hospital or clinic and will not be stored at home. °NOTE: This sheet is a summary. It may not cover all possible information. If you have questions about this medicine, talk to your doctor, pharmacist, or health care provider. °© 2013, [employer]. (10/23/2007 2:32:25 PM) ° °

## 2013-03-08 NOTE — Telephone Encounter (Signed)
FMLA papers completed for pts wife [name]) and faxed by nurse Amy on 03/07/2013.  Faxed to:   [employer] @ [phone]      COPY SCANNED

## 2013-03-11 LAB — KAPPA/LAMBDA LIGHT CHAINS
Kappa:Lambda Ratio: 42.78 — ABNORMAL HIGH (ref 0.26–1.65)
Lambda Free Lght Chn: 1.15 mg/dL (ref 0.57–2.63)

## 2013-03-13 ENCOUNTER — Ambulatory Visit: Payer: Self-pay

## 2013-03-13 ENCOUNTER — Ambulatory Visit: Payer: Self-pay | Admitting: Hematology & Oncology

## 2013-03-13 ENCOUNTER — Other Ambulatory Visit: Payer: Self-pay | Admitting: Lab

## 2013-03-20 ENCOUNTER — Telehealth: Payer: Self-pay | Admitting: *Deleted

## 2013-03-20 ENCOUNTER — Ambulatory Visit (HOSPITAL_BASED_OUTPATIENT_CLINIC_OR_DEPARTMENT_OTHER): Payer: BC Managed Care – PPO

## 2013-03-20 ENCOUNTER — Telehealth: Payer: Self-pay | Admitting: Nurse Practitioner

## 2013-03-20 ENCOUNTER — Other Ambulatory Visit (HOSPITAL_BASED_OUTPATIENT_CLINIC_OR_DEPARTMENT_OTHER): Payer: BC Managed Care – PPO | Admitting: Lab

## 2013-03-20 VITALS — BP 133/81 | HR 86 | Temp 97.8°F | Resp 16

## 2013-03-20 DIAGNOSIS — C9000 Multiple myeloma not having achieved remission: Secondary | ICD-10-CM

## 2013-03-20 DIAGNOSIS — M549 Dorsalgia, unspecified: Secondary | ICD-10-CM

## 2013-03-20 DIAGNOSIS — M899 Disorder of bone, unspecified: Secondary | ICD-10-CM

## 2013-03-20 DIAGNOSIS — R112 Nausea with vomiting, unspecified: Secondary | ICD-10-CM

## 2013-03-20 LAB — CBC WITH DIFFERENTIAL (CANCER CENTER ONLY)
Eosinophils Absolute: 0 10*3/uL (ref 0.0–0.5)
HCT: 28.4 % — ABNORMAL LOW (ref 38.7–49.9)
LYMPH%: 11.7 % — ABNORMAL LOW (ref 14.0–48.0)
MCH: 33.3 pg (ref 28.0–33.4)
MCV: 99 fL — ABNORMAL HIGH (ref 82–98)
MONO#: 0.5 10*3/uL (ref 0.1–0.9)
NEUT%: 74.9 % (ref 40.0–80.0)
RDW: 14.6 % (ref 11.1–15.7)
WBC: 3.6 10*3/uL — ABNORMAL LOW (ref 4.0–10.0)

## 2013-03-20 LAB — CMP (CANCER CENTER ONLY)
CO2: 30 mEq/L (ref 18–33)
Creat: 1.3 mg/dl — ABNORMAL HIGH (ref 0.6–1.2)
Glucose, Bld: 103 mg/dL (ref 73–118)
Total Bilirubin: 0.9 mg/dl (ref 0.20–1.60)

## 2013-03-20 MED ORDER — FENTANYL 25 MCG/HR TD PT72: 1.0000 | MEDICATED_PATCH | TRANSDERMAL | Status: DC

## 2013-03-20 MED ORDER — LACTULOSE 10 GM/15ML PO SOLN: 20.0000 g | Freq: Three times a day (TID) | ORAL | Status: DC

## 2013-03-20 MED ORDER — ZOLEDRONIC ACID 4 MG/100ML IV SOLN
4.0000 mg | Freq: Once | INTRAVENOUS | Status: AC
  Administered 2013-03-20: 4 mg via INTRAVENOUS
  Filled 2013-03-20: qty 100

## 2013-03-20 MED ORDER — CALCITONIN (SALMON) 200 UNIT/ML IJ SOLN
500.0000 [IU] | Freq: Once | INTRAMUSCULAR | Status: AC
  Administered 2013-03-20: 500 [IU] via INTRAMUSCULAR
  Filled 2013-03-20: qty 2.5

## 2013-03-20 MED ORDER — OXYCODONE HCL 5 MG PO TABS: ORAL_TABLET | ORAL | Status: DC

## 2013-03-20 MED ORDER — SODIUM CHLORIDE 0.9 % IV SOLN
INTRAVENOUS | Status: DC
  Administered 2013-03-20: 12:00:00 via INTRAVENOUS

## 2013-03-20 MED ORDER — FUROSEMIDE 10 MG/ML IJ SOLN
40.0000 mg | Freq: Once | INTRAMUSCULAR | Status: AC
  Administered 2013-03-20: 40 mg via INTRAVENOUS

## 2013-03-20 MED ORDER — METOCLOPRAMIDE HCL 5 MG/ML IJ SOLN
10.0000 mg | Freq: Once | INTRAMUSCULAR | Status: AC
  Administered 2013-03-20: 10 mg via INTRAVENOUS

## 2013-03-20 MED ORDER — HYDROMORPHONE HCL PF 1 MG/ML IJ SOLN
2.0000 mg | Freq: Once | INTRAMUSCULAR | Status: AC
  Administered 2013-03-20: 2 mg via INTRAVENOUS
  Filled 2013-03-20: qty 2

## 2013-03-20 NOTE — Telephone Encounter (Signed)
Left message with 8-21 CT 230pm to be NPO 4 hrs after he sees [place]. Left her message to please call and leave me message she got this so I dont call her early in the morning.

## 2013-03-20 NOTE — Progress Notes (Signed)
At 1143 pt c/0 pain all over.  Wife states he did take his oral pain medication this am but zomited it up.  Pt is worried about addiction to pain medication.  Reassured pt that he did not need to worry about this when he actually had pain.  Pt voiced that he would take something for pain.  dilausis 1mg  was given IV and pt c/o being "lightheaded".  Pt head reclined to a flat position with some relief.  At 1250 pt staqnding to walk to bathroom and went thought he was going to faint.  Helped back to chair.  VSS.  O2 sat was 88% so O2 started per [place] at 2L/min.  Skin warm and dry.  At 1300 O2 sat 98%.  Pt transferred to bed via WC.  Pt vomitted large amt of clear liquid.  Order received for Ativan but pt refused because he did not want to be lightheaded again.  At 1356 pt vomitted again, clear liquid and Reglan was given IV with relief and no lightheadedness.  Dr. Myna Hidalgo in to see pt.

## 2013-03-20 NOTE — Patient Instructions (Addendum)
Zoledronic Acid injection (Hypercalcemia, Oncology) What is this medicine? ZOLEDRONIC ACID (ZOE le dron ik AS id) lowers the amount of calcium loss from bone. It is used to treat too much calcium in your blood from cancer. It is also used to prevent complications of cancer that has spread to the bone. This medicine may be used for other purposes; ask your health care provider or pharmacist if you have questions. What should I tell my health care provider before I take this medicine? They need to know if you have any of these conditions: -aspirin-sensitive asthma -dental disease -kidney disease -an unusual or allergic reaction to zoledronic acid, other medicines, foods, dyes, or preservatives -pregnant or trying to get pregnant -breast-feeding How should I use this medicine? This medicine is for infusion into a vein. It is given by a health care professional in a hospital or clinic setting. Talk to your pediatrician regarding the use of this medicine in children. Special care may be needed. Overdosage: If you think you have taken too much of this medicine contact a poison control center or emergency room at once. NOTE: This medicine is only for you. Do not share this medicine with others. What if I miss a dose? It is important not to miss your dose. Call your doctor or health care professional if you are unable to keep an appointment. What may interact with this medicine? -certain antibiotics given by injection -NSAIDs, medicines for pain and inflammation, like ibuprofen or naproxen -some diuretics like bumetanide, furosemide -teriparatide -thalidomide This list may not describe all possible interactions. Give your health care provider a list of all the medicines, herbs, non-prescription drugs, or dietary supplements you use. Also tell them if you smoke, drink alcohol, or use illegal drugs. Some items may interact with your medicine. What should I watch for while using this medicine? Visit  your doctor or health care professional for regular checkups. It may be some time before you see the benefit from this medicine. Do not stop taking your medicine unless your doctor tells you to. Your doctor may order blood tests or other tests to see how you are doing. Women should inform their doctor if they wish to become pregnant or think they might be pregnant. There is a potential for serious side effects to an unborn child. Talk to your health care professional or pharmacist for more information. You should make sure that you get enough calcium and vitamin D while you are taking this medicine. Discuss the foods you eat and the vitamins you take with your health care professional. Some people who take this medicine have severe bone, joint, and/or muscle pain. This medicine may also increase your risk for a broken thigh bone. Tell your doctor right away if you have pain in your upper leg or groin. Tell your doctor if you have any pain that does not go away or that gets worse. What side effects may I notice from receiving this medicine? Side effects that you should report to your doctor or health care professional as soon as possible: -allergic reactions like skin rash, itching or hives, swelling of the face, lips, or tongue -anxiety, confusion, or depression -breathing problems -changes in vision -feeling faint or lightheaded, falls -jaw burning, cramping, pain -muscle cramps, stiffness, or weakness -trouble passing urine or change in the amount of urine Side effects that usually do not require medical attention (report to your doctor or health care professional if they continue or are bothersome): -bone, joint, or muscle pain -  fever -hair loss -irritation at site where injected -loss of appetite -nausea, vomiting -stomach upset -tired This list may not describe all possible side effects. Call your doctor for medical advice about side effects. You may report side effects to FDA at  1-800-FDA-1088. Where should I keep my medicine? This drug is given in a hospital or clinic and will not be stored at home. NOTE: This sheet is a summary. It may not cover all possible information. If you have questions about this medicine, talk to your doctor, pharmacist, or health care provider.  2013, [employer]vier/[employer] Standard. (01/14/2011 9:06:58 AM) Hypercalcemia Hypercalcemia means the calcium in your blood is too high. A level above 10.5 milligrams per deciliter of blood is considered high. Calcium in our blood is important for the control of many things, such as:  Blood clotting.  Conducting of nerve impulses.  Muscle contraction.  Maintaining teeth and bone health.  Other body functions. In the bloodstream, calcium maintains a constant balance with another mineral, phosphate. Calcium is absorbed into the body through the small intestine. This is helped by Vitamin D. Calcium levels are maintained mostly by vitamin D and a hormone (parathyroid hormone). But the kidneys also help. Hypercalcemia can happen when the concentration of calcium is too high for the kidneys to maintain balance. The body maintains a balance between the calcium we eat and the calcium already in our body. If calcium intake is increased or we cannot use calcium properly, there may be problems. Some common sources of calcium are:   Dairy products.  Nuts.  Eggs.  Whole grains.  Legumes.  Green leafy vegetables. CAUSES There are many causes of this condition, but some common ones are:  Hyperparathyroidism. This is an over activity of the parathyroid gland.  Cancers of the breast, kidney, lung, head and neck are common causes of calcium increases.  Medications that cause you to urinate more often (diuretics), nausea, vomiting and diarrhea also increase the calcium in the blood.  Overuse of calcium-containing antacids. SYMPTOMS  Many patients with mild hypercalcemia have no symptoms. For those with  symptoms common problems include:  Loss of appetite.  Constipation.  Increased thirst.  Heart rhythm changes.  Abnormal thinking.  Nausea.  Abdominal pain.  Kidney stones.  Mood swings.  Coma and death when severe.  Vomiting.  Increased urination.  High blood pressure.  Confusion. DIAGNOSIS   Your caregiver will do a medical history and perform a physical exam on you.  Calcium and parathyroid hormone (PTH) may be measured with a blood test. TREATMENT   The treatment depends on the calcium level and what is causing the higher level. Hypercalcemia can be lifethreatening. Fast lowering of the calcium level may be necessary.  With normal kidney function, fluids can be given by vein to clear the excess calcium. Hemodialysis works well to reduce dangerous calcium levels if there is poor kidney function. This is a procedure in which a machine is used to filter out unwanted substances. The blood is then returned to the body.  Drugs, such as diuretics, can be given after adequate fluid intake is established. These medications help the kidneys get rid of extra calcium. Drugs that lessen (inhibit) bone loss are helpful in gaining long-term control. Phosphate pills help lower high calcium levels caused by a low supply of phosphate. Anti-inflammatory agents such as steroids are helpful with some cancers and toxic levels of vitamin D.  Treatment of the underlying cause of the hypercalcemia will also correct the imbalance. Hyperparathyroidism  is usually treated by surgical removal of one or more of the parathyroid glands and any tissue, other than the glands themselves, that is producing too much hormone.  The hypercalcemia caused by cancer is difficult to treat without controlling the cancer. Symptoms can be improved with fluids and drug therapy as outlined above. PROGNOSIS   Surgery to remove the parathyroid glands is usually successful. This also depends on the amount of damage to  the kidneys and whether or not it can be treated.  Mild hypercalcemia can be controlled with good fluid intake and the use of effective medications.  Hypercalcemia often develops as a late complication of cancer. The expected outlook is poor without effective anticancer therapy. PREVENTION   If you are at risk for developing hypercalcemia, be familiar with early symptoms. Report these to your caregiver.  Good fluid intake (up to four quarts of liquid a day if possible) is helpful.  Try to control nausea and vomiting, and treat fevers to avoid dehydration.  Lowering the amount of calcium in your diet is not necessary. High blood calcium reduces absorption of calcium in the intestine.  Stay as active as possible. SEEK IMMEDIATE MEDICAL CARE IF:   You develop chest pain, sweating, or shortness of breath.  You get confused, feel faint or pass out.  You develop severe nausea and vomiting. MAKE SURE YOU:   Understand these instructions.  Will watch your condition.  Will get help right away if you are not doing well or get worse. Document Released: 10/01/2004 Document Revised: 10/10/2011 Document Reviewed: 07/13/2010 [facility] Patient Information 2014 [place], [place].

## 2013-03-20 NOTE — Telephone Encounter (Signed)
Pt called stating he has another headache and pain in his back and legs. Asked if he was taking his pain medication and he stated it made him "almost throw up". Reviewed with Dr Myna Hidalgo as the pt said he was feeling the same as he was the last time his Ca+ was elevated. To come in to have labs checked and for probable hydration. Pt to be here around 1100 as a work-in.

## 2013-03-21 ENCOUNTER — Telehealth: Payer: Self-pay | Admitting: Hematology & Oncology

## 2013-03-21 ENCOUNTER — Other Ambulatory Visit (HOSPITAL_BASED_OUTPATIENT_CLINIC_OR_DEPARTMENT_OTHER): Payer: Self-pay

## 2013-03-21 ENCOUNTER — Ambulatory Visit (HOSPITAL_BASED_OUTPATIENT_CLINIC_OR_DEPARTMENT_OTHER): Payer: BC Managed Care – PPO | Admitting: Lab

## 2013-03-21 ENCOUNTER — Ambulatory Visit (HOSPITAL_BASED_OUTPATIENT_CLINIC_OR_DEPARTMENT_OTHER): Payer: BC Managed Care – PPO

## 2013-03-21 ENCOUNTER — Ambulatory Visit (HOSPITAL_BASED_OUTPATIENT_CLINIC_OR_DEPARTMENT_OTHER)
Admission: RE | Admit: 2013-03-21 | Discharge: 2013-03-21 | Disposition: A | Discharge status: Home / Self Care | Payer: BC Managed Care – PPO | Source: Ambulatory Visit | Attending: Hematology & Oncology | Admitting: Hematology & Oncology

## 2013-03-21 VITALS — BP 109/69 | HR 90 | Temp 98.0°F | Resp 18

## 2013-03-21 DIAGNOSIS — C9000 Multiple myeloma not having achieved remission: Secondary | ICD-10-CM | POA: Insufficient documentation

## 2013-03-21 DIAGNOSIS — Z5111 Encounter for antineoplastic chemotherapy: Secondary | ICD-10-CM

## 2013-03-21 DIAGNOSIS — Z5112 Encounter for antineoplastic immunotherapy: Secondary | ICD-10-CM

## 2013-03-21 LAB — CBC WITH DIFFERENTIAL (CANCER CENTER ONLY)
BASO#: 0 10*3/uL (ref 0.0–0.2)
BASO%: 0.2 % (ref 0.0–2.0)
Eosinophils Absolute: 0 10*3/uL (ref 0.0–0.5)
HCT: 27.5 % — ABNORMAL LOW (ref 38.7–49.9)
HGB: 9.3 g/dL — ABNORMAL LOW (ref 13.0–17.1)
LYMPH#: 0.4 10*3/uL — ABNORMAL LOW (ref 0.9–3.3)
LYMPH%: 6.5 % — ABNORMAL LOW (ref 14.0–48.0)
MCV: 98 fL (ref 82–98)
MONO#: 0.5 10*3/uL (ref 0.1–0.9)
NEUT%: 84.4 % — ABNORMAL HIGH (ref 40.0–80.0)
RBC: 2.81 10*6/uL — ABNORMAL LOW (ref 4.20–5.70)
RDW: 14.3 % (ref 11.1–15.7)
WBC: 6.2 10*3/uL (ref 4.0–10.0)

## 2013-03-21 LAB — CMP (CANCER CENTER ONLY)
ALT(SGPT): 22 U/L (ref 10–47)
Alkaline Phosphatase: 77 U/L (ref 26–84)
CO2: 29 mEq/L (ref 18–33)
Creat: 1.1 mg/dl (ref 0.6–1.2)
Glucose, Bld: 161 mg/dL — ABNORMAL HIGH (ref 73–118)
Sodium: 139 mEq/L (ref 128–145)
Total Bilirubin: 0.9 mg/dl (ref 0.20–1.60)

## 2013-03-21 MED ORDER — DEXAMETHASONE SODIUM PHOSPHATE 20 MG/5ML IJ SOLN
40.0000 mg | Freq: Once | INTRAMUSCULAR | Status: AC
  Administered 2013-03-21: 40 mg via INTRAVENOUS
  Filled 2013-03-21: qty 10

## 2013-03-21 MED ORDER — LORAZEPAM 2 MG/ML IJ SOLN
0.5000 mg | Freq: Once | INTRAMUSCULAR | Status: AC
  Administered 2013-03-21: 0.5 mg via INTRAVENOUS

## 2013-03-21 MED ORDER — HEPARIN SOD (PORK) LOCK FLUSH 100 UNIT/ML IV SOLN
500.0000 [IU] | Freq: Once | INTRAVENOUS | Status: AC | PRN
  Administered 2013-03-21: 500 [IU]
  Filled 2013-03-21: qty 5

## 2013-03-21 MED ORDER — SODIUM CHLORIDE 0.9 % IJ SOLN
10.0000 mL | INTRAMUSCULAR | Status: DC | PRN
  Administered 2013-03-21: 10 mL
  Filled 2013-03-21: qty 10

## 2013-03-21 MED ORDER — SODIUM CHLORIDE 0.9 % IV SOLN
Freq: Once | INTRAVENOUS | Status: AC
  Administered 2013-03-21: 12:00:00 via INTRAVENOUS

## 2013-03-21 MED ORDER — SODIUM CHLORIDE 0.9 % IV SOLN
300.0000 mg/m2 | Freq: Once | INTRAVENOUS | Status: AC
  Administered 2013-03-21: 620 mg via INTRAVENOUS
  Filled 2013-03-21: qty 31

## 2013-03-21 MED ORDER — ONDANSETRON 8 MG/50ML IVPB (CHCC)
8.0000 mg | Freq: Once | INTRAVENOUS | Status: AC
  Administered 2013-03-21: 8 mg via INTRAVENOUS

## 2013-03-21 MED ORDER — IOHEXOL 300 MG/ML  SOLN
100.0000 mL | Freq: Once | INTRAMUSCULAR | Status: AC | PRN
  Administered 2013-03-21: 100 mL via INTRAVENOUS

## 2013-03-21 MED ORDER — BORTEZOMIB CHEMO SQ INJECTION 3.5 MG (2.5MG/ML)
1.3000 mg/m2 | Freq: Once | INTRAMUSCULAR | Status: AC
  Administered 2013-03-21: 2.75 mg via SUBCUTANEOUS
  Filled 2013-03-21: qty 2.75

## 2013-03-21 NOTE — Patient Instructions (Signed)
[facility] Discharge Instructions for Patients Receiving Chemotherapy  Today you received the following chemotherapy agents Cytoxan, Velcade  To help prevent nausea and vomiting after your treatment, we encourage you to take your nausea medication    If you develop nausea and vomiting that is not controlled by your nausea medication, call the clinic.   BELOW ARE SYMPTOMS THAT SHOULD BE REPORTED IMMEDIATELY:  *FEVER GREATER THAN 100.5 F  *CHILLS WITH OR WITHOUT FEVER  NAUSEA AND VOMITING THAT IS NOT CONTROLLED WITH YOUR NAUSEA MEDICATION  *UNUSUAL SHORTNESS OF BREATH  *UNUSUAL BRUISING OR BLEEDING  TENDERNESS IN MOUTH AND THROAT WITH OR WITHOUT PRESENCE OF ULCERS  *URINARY PROBLEMS  *BOWEL PROBLEMS  UNUSUAL RASH Items with * indicate a potential emergency and should be followed up as soon as possible.  Feel free to call the clinic you have any questions or concerns. The clinic phone number is [phone].    

## 2013-03-21 NOTE — Telephone Encounter (Signed)
Pt aware to be NPO 4 hrs before CT today and to get contrast to drink while getting tx

## 2013-03-21 NOTE — Progress Notes (Signed)
DIAGNOSIS: 1. Kappa light chain myeloma. 2. Recurrent hypercalcemia.  CURRENT THERAPY: 1. Weekly Velcade/Cytoxan/Decadron. 2. Zometa 4 mg IV q.3 weeks.  INTERIM HISTORY:  Mark [name] comes in for an unscheduled visit.  His wife calls in that he was having more pain.  He was having nausea and vomiting.  He was not feeling well at all.  This all typically goes along with him having recurrent hypercalcemia.  He came into the office today.  Calcium was 12.9.  His renal function was okay.  He last got his chemotherapy back on August 8th.  At that point in time, his light chain disease was improving with his kappa light chain of 49.2 mg/dL.  He said he is having more back pain.  He is not taking his pain medicine at home because it makes him sick and he also does not want get addicted.  I went ahead and gave him a prescription for Duragesic patch at 25 mcg.  I also gave him some oxycodone to take for breakthrough pain.  He has had no bleeding.  He has been constipated.  I did give him a prescription for lactulose to try to help with constipation.  He has had no fever.  He has had no mouth sores.  He has had no shortness of breath.  PHYSICAL EXAMINATION:  Vital signs:  Temperature of 97.8, pulse 86, respiratory rate 18, blood pressure 133/81. He did have nausea, vomiting while in the office.  We gave him IV fluids.  We gave him some IV antiemetics.  Oral exam:  His oral exam shows no mucositis.  Lungs:  Clear.  Cardiac exam:  Regular rate and rhythm with no murmurs, rubs or bruits.  Abdomen:  Soft.  He has some slight decreased bowel sounds.  There is no fluid wave.  There is no palpable hepatosplenomegaly.  Extremities:  Show no clubbing, cyanosis or edema.  He has good muscle strength in his legs.  LABORATORY STUDIES:  White cell count is 3.6, hemoglobin 9.6, hematocrit 28.4, platelet count 265.  Sodium 144, potassium 4.3, BUN 13, creatinine 1.3.  His LFTs were okay.  His  albumin is 4.1.  I am quite worried that we are not seeing a better response to the chemotherapy.  Granted, he has only had 3 cycles of treatment.  Again, he does have high risk cytogenetics.  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  I just think that we may have to consider a more aggressive leukemia- like program for him.  This would indicate most likely be the VD-PACE regimen.  I do not want to "give up" on the Cytoxan/Velcade/Decadron yet. However, the fact that he does have these chromosomal abnormalities is somewhat troublesome to me.  He will come back tomorrow and we will see how he is doing.  He apparently felt better after the IV fluids.  I gave some calcitonin and Zometa.  Mark [name] was here for probably about 4 hours today.    ______________________________ [name], M.D. PRE/MEDQ  D:  03/20/2013  T:  03/21/2013  Job:  [id number]

## 2013-03-22 ENCOUNTER — Ambulatory Visit: Payer: BC Managed Care – PPO | Admitting: Lab

## 2013-03-22 ENCOUNTER — Other Ambulatory Visit: Payer: Self-pay | Admitting: Lab

## 2013-03-22 ENCOUNTER — Ambulatory Visit: Payer: Self-pay

## 2013-03-22 ENCOUNTER — Ambulatory Visit (HOSPITAL_BASED_OUTPATIENT_CLINIC_OR_DEPARTMENT_OTHER): Payer: BC Managed Care – PPO | Admitting: Hematology & Oncology

## 2013-03-22 DIAGNOSIS — R52 Pain, unspecified: Secondary | ICD-10-CM

## 2013-03-22 DIAGNOSIS — C9000 Multiple myeloma not having achieved remission: Secondary | ICD-10-CM

## 2013-03-22 NOTE — Progress Notes (Signed)
No treatment today per dr. ennever 

## 2013-03-22 NOTE — Progress Notes (Signed)
This office note has been dictated.

## 2013-03-23 NOTE — Progress Notes (Signed)
DIAGNOSIS:  Kappa light chain myeloma.  CURRENT THERAPY: 1. Weekly Velcade/Cytoxan/Decadron (the patient is starting his second     3-week cycle). 2. Zometa 4 mg IV q.3 weeks.  INTERIM HISTORY:  Mr. Cowin comes in for followup.  We had to get him in earlier this week.  He was having more problems pain wise.  He is now on a fentanyl patch which seems to be helping him out quite a bit.  We also gave him his treatment yesterday.  His calcium was back to 12.9. This helped him out quite a bit.  Yesterday, his calcium was down to 11.9.  Today, he is feeling a whole lot better.  We did go ahead and repeat a CT scan of his abdomen and pelvis.  This was to see how this pelvic mass was going.  This was done on the 21st. His paraspinal mass at L5 now measures 5.3 x 4.8 cm.  He has a left iliac wing lesion measuring 3.2 x 1.5 cm.  His right iliac lesion measured 2.9 x 2.2 cm.  He has lytic lesions elsewhere.  Again, he is feeling better.  He is not having as much pain.  He has had no cough or shortness breath.  There has been some constipation.  I think he is on lactulose for this.  He has had no fever.  He has had no bleeding.  Overall, his performance status is ECOG 1.  PHYSICAL EXAM:  General:  This is a well-developed, well-nourished African American gentleman in no obvious distress.  Vital Signs:  Show a temperature of 98.4, pulse 84, respiratory rate 18, blood pressure 123/71.  Weight is 194.  Head and Neck:  Show a normocephalic, atraumatic skull.  There are no ocular or oral lesions.  There are no palpable cervical or supraclavicular lymph nodes.  Lungs:  Clear bilaterally.  Cardiac:  Regular rate and rhythm with a normal S1 and S2. There are no murmurs, rubs, or bruits.  Abdomen:  Soft.  He has good bowel sounds.  There is no fluid wave.  There is no palpable hepatosplenomegaly.  Extremities:  Show no clubbing, cyanosis, or edema. Neurological:  Shows no focal neurological  deficit.  LABORATORY DATA:  Not done this visit.  IMPRESSION:  Mr. Swinger is a nice 42 year old gentleman with kappa light chain myeloma.  He was diagnosed back in early July of 2014.  Unfortunately, he does have bad cytogenetics.  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  I have a sense that we are going to have to move him along with more aggressive therapy, almost as if he has leukemia.  We will go ahead and give him his next 2 weeks of treatment.  We will then reassess with scans.  If we have to increase the intensity of therapy, then I think it would probably be the VD-PACE regimen.  We will plan for followup for him in about 3 weeks.  I think that his calcium levels will clearly dictate his course.    ______________________________ [name], M.D. PRE/MEDQ  D:  03/22/2013  T:  03/23/2013  Job:  [id number]

## 2013-03-25 ENCOUNTER — Telehealth: Payer: Self-pay | Admitting: *Deleted

## 2013-03-25 NOTE — Telephone Encounter (Signed)
CALLED PATIENT TO INFORM OF FU VISIT FOR 03-28-13 BEING MOVED TO 3 PM ON 03-28-13, DUE TO DR. KINARD NEEDING THIS TIME FOR CASE THAT HE IS WORKING ON, LVM FOR A RETURN CALL

## 2013-03-26 ENCOUNTER — Encounter: Payer: Self-pay | Admitting: Radiation Oncology

## 2013-03-26 LAB — PROTEIN ELECTROPHORESIS, SERUM, WITH REFLEX
Alpha-1-Globulin: 8.5 % — ABNORMAL HIGH (ref 2.9–4.9)
Beta 2: 5 % (ref 3.2–6.5)
Gamma Globulin: 9 % — ABNORMAL LOW (ref 11.1–18.8)

## 2013-03-26 LAB — IGG, IGA, IGM
IgA: 49 mg/dL — ABNORMAL LOW (ref 68–379)
IgG (Immunoglobin G), Serum: 686 mg/dL (ref 650–1600)

## 2013-03-26 LAB — KAPPA/LAMBDA LIGHT CHAINS: Kappa:Lambda Ratio: 42.77 — ABNORMAL HIGH (ref 0.26–1.65)

## 2013-03-26 LAB — LACTATE DEHYDROGENASE: LDH: 180 U/L (ref 94–250)

## 2013-03-26 LAB — BETA 2 MICROGLOBULIN, SERUM: Beta-2 Microglobulin: 3.01 mg/L — ABNORMAL HIGH (ref 1.01–1.73)

## 2013-03-28 ENCOUNTER — Telehealth: Payer: Self-pay | Admitting: *Deleted

## 2013-03-28 ENCOUNTER — Encounter: Payer: Self-pay | Admitting: Radiation Oncology

## 2013-03-28 ENCOUNTER — Ambulatory Visit
Admission: RE | Admit: 2013-03-28 | Discharge: 2013-03-28 | Disposition: A | Discharge status: Home / Self Care | Payer: BC Managed Care – PPO | Source: Ambulatory Visit | Attending: Radiation Oncology | Admitting: Radiation Oncology

## 2013-03-28 ENCOUNTER — Ambulatory Visit: Payer: BC Managed Care – PPO | Admitting: Radiation Oncology

## 2013-03-28 VITALS — BP 134/81 | HR 86 | Temp 98.2°F | Resp 20 | Wt 201.2 lb

## 2013-03-28 DIAGNOSIS — C9000 Multiple myeloma not having achieved remission: Secondary | ICD-10-CM

## 2013-03-28 NOTE — Progress Notes (Signed)
Radiation Oncology         [phone] ________________________________  Name: Mark [name] MRN: [id number]  Date: 03/28/2013  DOB: [birth year]  Follow-Up Visit Note  CC: No primary provider on file.  Mark Macho, MD  Diagnosis:   Light chain multiple myeloma  Interval Since Last Radiation:  1  months  Narrative:  The patient returns today for routine follow-up.  Overall his lower back pain has improved. He denies any weakness in his lower extremities but does complain of pain along the back part of his thighs when standing for a long period of time or walking. Does notice some pain in the left upper anterior chest region. He denies any shortness of breath the patient continues with aggressive chemotherapy.                              ALLERGIES:  is allergic to ibuprofen.  Meds: Current Outpatient Prescriptions  Medication Sig Dispense Refill  . acetaminophen (TYLENOL) 500 MG tablet Take 1 tablet (500 mg total) by mouth every 6 (six) hours as needed for pain (Mild pain.).      Marland Kitchen famciclovir (FAMVIR) 500 MG tablet Take 0.5 tablets (250 mg total) by mouth daily.  30 tablet  0  . famotidine (PEPCID) 20 MG tablet Take 1 tablet (20 mg total) by mouth 2 (two) times daily.  60 tablet  0  . fentaNYL (DURAGESIC - DOSED MCG/HR) 25 MCG/HR patch Place 1 patch (25 mcg total) onto the skin every 3 (three) days.  10 patch  0  . lactulose (CHRONULAC) 10 GM/15ML solution Take 30 mLs (20 g total) by mouth 3 (three) times daily.  240 mL  4  . oxyCODONE (OXY IR/ROXICODONE) 5 MG immediate release tablet Take 1-2 pills, IF NEEDED, for pain flareup every 4hr  90 tablet  0  . prochlorperazine (COMPAZINE) 10 MG tablet Take 1 tablet (10 mg total) by mouth every 6 (six) hours as needed (Nausea or vomiting).  30 tablet  1   No current facility-administered medications for this encounter.    Physical Findings: The patient is in no acute distress. Patient is alert and oriented.  weight is 201 lb 3.2 oz  (91.264 kg). His temperature is 98.2 F (36.8 C). His blood pressure is 134/81 and his pulse is 86. His respiration is 20. Marland Kitchen  No palpable supraclavicular or axillary adenopathy. The lungs are clear to auscultation. The heart has a regular rhythm and rate. The abdomen is soft and nontender with normal bowel sounds. On neurological examination motor strength is 5 out of 5 in the proximal and distal muscle groups of the lower extremities. Palpation along the rib cage area reveals no point tenderness. Palpation along the spine area reveals no point tenderness.  Lab Findings: Lab Results  Component Value Date   WBC 6.2 03/21/2013   HGB 9.3* 03/21/2013   HCT 27.5* 03/21/2013   MCV 98 03/21/2013   PLT 253 03/21/2013      Radiographic Findings: Ct Abdomen Pelvis W Contrast  03/21/2013   *RADIOLOGY REPORT*  Clinical Data: Multiple myeloma  CT ABDOMEN AND PELVIS WITH CONTRAST  Technique:  Multidetector CT imaging of the abdomen and pelvis was performed following the standard protocol during bolus administration of intravenous contrast.  Contrast: OMNIPAQUE IOHEXOL 300 MG/ML  SOLN intravenously.  Comparison: CT scan of May 24, 2010; MRI scan of February 01, 2013.  Findings: Stable scarring is noted  in both lung bases.  No focal abnormality is noted in the liver, spleen or pancreas.  No gallstones are noted.  Adrenal glands and kidneys appear normal. No hydronephrosis or renal obstruction is noted.  No gross bowel abnormality is seen.  Urinary bladder appears normal.  No abnormal fluid collections or significant adenopathy is noted.  There is a destructive soft tissue lesion seen involving the right sided pedicle, transverse process and body of L5 which currently measures 5.3 x 4.8 cm and appears to be slightly smaller compared to previous MRI.  There is also noted a destructive lesion measuring 3.2 x 1.5 cm in the left iliac wing as well as 2.9 x 2.2 cm lytic area in the right iliac bone just above the  acetabulum. Another abnormality measuring 14 x 11 mm is noted in the superior portion of the right iliac wing.  Multiple other lucencies are noted throughout the spine and pelvis consistent with history of multiple myeloma.  At least two lytic lesions are seen involving the right lower ribs inferiorly.  IMPRESSION: Multiple lytic lesions are noted throughout the visualized skeleton, including the right ribs, spine and pelvis, consistent with history of multiple myeloma.  The largest lesion is noted involving the right side of the L5 vertebral body, pedicle and transverse process which currently measures 5.3 x 4.8 cm and may be slightly decreased in size compared to prior MRI exam.  Other large lytic lesions are seen involving both iliac bones as described above.   Original Report Authenticated By: Lupita Raider.,  M.D.    Impression:  The patient is recovering from the effects of radiation.  Overall his pain is improved  Since completing his palliative radiation therapy direct at the lower lumbar spine and upper sacrum region. I am unsure of the patient's pain in the left anterior chest region. I did review the patient's recent CT scans as it compares to this MRI of the lumbar spine. Scan shows significant shrinkage of the soft tissue mass associated with this bony disease in the lower lumbar spine area. The patient has a concerning lesion at T12 on his recent CT scan. I did speak with Dr. Myna Hidalgo and we will order MRI for further evaluation of this issue. Patient may require radiation to this area or possibly vertebroplasty.  Plan:  When necessary followup in radiation oncology. Patient will continue close followup in medical oncology and continues on Velcade,  CYTOXAN and Decadron  _____________________________________  -----------------------------------  Mark Lade, PhD, MD

## 2013-03-28 NOTE — Progress Notes (Signed)
Pt c/o leg pain that is intermittent but never completely gone; he states he walks and then has "pain in the back of his thighs that may only last 10 seconds." He states he has also developed left chest pain "that feels like his lower back pain" x 2 days. He states it is a dull ache that is constant. Pt wearing Duragesic patch, taking Oxy-IR 1 tab daily. He also c/o constipation, taking Chronulac 3 x  daily. Advised he take stool softener nightly as needed. Pt last had BM yesterday but states he "had to have an enema". Pt states appetite good, fatigue resolving.

## 2013-03-28 NOTE — Telephone Encounter (Signed)
CALLED PATIENT TO INFORM OF TEST, SPOKE WITH PATIENT AND HE IS AWARE OF THIS TEST

## 2013-03-29 ENCOUNTER — Ambulatory Visit (HOSPITAL_BASED_OUTPATIENT_CLINIC_OR_DEPARTMENT_OTHER): Payer: BC Managed Care – PPO

## 2013-03-29 ENCOUNTER — Other Ambulatory Visit (HOSPITAL_BASED_OUTPATIENT_CLINIC_OR_DEPARTMENT_OTHER): Payer: BC Managed Care – PPO | Admitting: Lab

## 2013-03-29 VITALS — BP 130/66 | HR 93 | Temp 99.5°F | Resp 18

## 2013-03-29 DIAGNOSIS — Z5112 Encounter for antineoplastic immunotherapy: Secondary | ICD-10-CM

## 2013-03-29 DIAGNOSIS — C9000 Multiple myeloma not having achieved remission: Secondary | ICD-10-CM

## 2013-03-29 DIAGNOSIS — Z5111 Encounter for antineoplastic chemotherapy: Secondary | ICD-10-CM

## 2013-03-29 LAB — CBC WITH DIFFERENTIAL (CANCER CENTER ONLY)
BASO%: 0 % (ref 0.0–2.0)
Eosinophils Absolute: 0 10*3/uL (ref 0.0–0.5)
HCT: 26 % — ABNORMAL LOW (ref 38.7–49.9)
LYMPH%: 9.9 % — ABNORMAL LOW (ref 14.0–48.0)
MCV: 99 fL — ABNORMAL HIGH (ref 82–98)
MONO#: 0.5 10*3/uL (ref 0.1–0.9)
NEUT%: 76.2 % (ref 40.0–80.0)
RDW: 14.9 % (ref 11.1–15.7)
WBC: 3.6 10*3/uL — ABNORMAL LOW (ref 4.0–10.0)

## 2013-03-29 LAB — TECHNOLOGIST REVIEW CHCC SATELLITE: Tech Review: 2

## 2013-03-29 LAB — CMP (CANCER CENTER ONLY)
BUN, Bld: 11 mg/dL (ref 7–22)
CO2: 30 mEq/L (ref 18–33)
Creat: 1.1 mg/dl (ref 0.6–1.2)
Glucose, Bld: 140 mg/dL — ABNORMAL HIGH (ref 73–118)
Total Bilirubin: 1.1 mg/dl (ref 0.20–1.60)

## 2013-03-29 MED ORDER — HEPARIN SOD (PORK) LOCK FLUSH 100 UNIT/ML IV SOLN
500.0000 [IU] | Freq: Once | INTRAVENOUS | Status: AC | PRN
  Administered 2013-03-29: 500 [IU]
  Filled 2013-03-29: qty 5

## 2013-03-29 MED ORDER — BORTEZOMIB CHEMO SQ INJECTION 3.5 MG (2.5MG/ML)
1.3000 mg/m2 | Freq: Once | INTRAMUSCULAR | Status: AC
  Administered 2013-03-29: 2.75 mg via SUBCUTANEOUS
  Filled 2013-03-29: qty 2.75

## 2013-03-29 MED ORDER — DEXAMETHASONE SODIUM PHOSPHATE 20 MG/5ML IJ SOLN
40.0000 mg | Freq: Once | INTRAMUSCULAR | Status: AC
  Administered 2013-03-29: 40 mg via INTRAVENOUS
  Filled 2013-03-29: qty 10

## 2013-03-29 MED ORDER — SODIUM CHLORIDE 0.9 % IV SOLN
300.0000 mg/m2 | Freq: Once | INTRAVENOUS | Status: AC
  Administered 2013-03-29: 620 mg via INTRAVENOUS
  Filled 2013-03-29: qty 31

## 2013-03-29 MED ORDER — ONDANSETRON 8 MG/50ML IVPB (CHCC)
8.0000 mg | Freq: Once | INTRAVENOUS | Status: AC
  Administered 2013-03-29: 8 mg via INTRAVENOUS

## 2013-03-29 MED ORDER — LORAZEPAM 2 MG/ML IJ SOLN
0.5000 mg | Freq: Once | INTRAMUSCULAR | Status: AC
  Administered 2013-03-29: 0.5 mg via INTRAVENOUS

## 2013-03-29 MED ORDER — SODIUM CHLORIDE 0.9 % IJ SOLN
10.0000 mL | INTRAMUSCULAR | Status: DC | PRN
  Administered 2013-03-29: 10 mL
  Filled 2013-03-29: qty 10

## 2013-03-29 MED ORDER — SODIUM CHLORIDE 0.9 % IV SOLN
Freq: Once | INTRAVENOUS | Status: AC
  Administered 2013-03-29: 12:00:00 via INTRAVENOUS

## 2013-03-29 MED ORDER — SODIUM CHLORIDE 0.9 % IJ SOLN
3.0000 mL | INTRAMUSCULAR | Status: DC | PRN
  Filled 2013-03-29: qty 10

## 2013-03-29 MED ORDER — ALTEPLASE 2 MG IJ SOLR
2.0000 mg | Freq: Once | INTRAMUSCULAR | Status: DC | PRN
  Filled 2013-03-29: qty 2

## 2013-03-29 MED ORDER — HEPARIN SOD (PORK) LOCK FLUSH 100 UNIT/ML IV SOLN
250.0000 [IU] | Freq: Once | INTRAVENOUS | Status: DC | PRN
  Filled 2013-03-29: qty 5

## 2013-03-29 NOTE — Patient Instructions (Addendum)
[facility] Discharge Instructions for Patients Receiving Chemotherapy  Today you received the following chemotherapy agents Cytoxan, Velcade  To help prevent nausea and vomiting after your treatment, we encourage you to take your nausea medication    If you develop nausea and vomiting that is not controlled by your nausea medication, call the clinic.   BELOW ARE SYMPTOMS THAT SHOULD BE REPORTED IMMEDIATELY:  *FEVER GREATER THAN 100.5 F  *CHILLS WITH OR WITHOUT FEVER  NAUSEA AND VOMITING THAT IS NOT CONTROLLED WITH YOUR NAUSEA MEDICATION  *UNUSUAL SHORTNESS OF BREATH  *UNUSUAL BRUISING OR BLEEDING  TENDERNESS IN MOUTH AND THROAT WITH OR WITHOUT PRESENCE OF ULCERS  *URINARY PROBLEMS  *BOWEL PROBLEMS  UNUSUAL RASH Items with * indicate a potential emergency and should be followed up as soon as possible.  Feel free to call the clinic you have any questions or concerns. The clinic phone number is [phone].    

## 2013-04-04 ENCOUNTER — Other Ambulatory Visit: Payer: Self-pay | Admitting: Radiation Oncology

## 2013-04-04 ENCOUNTER — Inpatient Hospital Stay (HOSPITAL_COMMUNITY): Admission: RE | Admit: 2013-04-04 | Payer: BC Managed Care – PPO | Source: Ambulatory Visit

## 2013-04-04 ENCOUNTER — Ambulatory Visit (HOSPITAL_COMMUNITY)
Admission: RE | Admit: 2013-04-04 | Discharge: 2013-04-04 | Disposition: A | Discharge status: Home / Self Care | Payer: BC Managed Care – PPO | Source: Ambulatory Visit | Attending: Radiation Oncology | Admitting: Radiation Oncology

## 2013-04-04 ENCOUNTER — Telehealth: Payer: Self-pay | Admitting: *Deleted

## 2013-04-04 ENCOUNTER — Other Ambulatory Visit (HOSPITAL_COMMUNITY): Payer: Self-pay

## 2013-04-04 DIAGNOSIS — C9000 Multiple myeloma not having achieved remission: Secondary | ICD-10-CM

## 2013-04-04 MED ORDER — LORAZEPAM 1 MG PO TABS: 1.0000 mg | ORAL_TABLET | Freq: Three times a day (TID) | ORAL | Status: DC

## 2013-04-04 NOTE — Telephone Encounter (Signed)
Called pt to confirm pharmacy. He would like prescriptions sent to [facility] [address], [phone]. Pt states he does not need pain medications refilled at this time. Informed pt this RN will call Lorazepam refill to [facility]. Pt verbalized understanding.

## 2013-04-04 NOTE — Telephone Encounter (Signed)
Mark [name], from MRI= [phone] called, only partial MRI completed today , patient couldn';ty lie still too much pain,  And need MD to write rx for pain med and ativan before completeing rest of MRI tomorrow at 4pm, he is scheduled for infusion tomorrow at [facility], will notify Dr.Kinard nurse and MD,  9:09 AM

## 2013-04-05 ENCOUNTER — Other Ambulatory Visit (HOSPITAL_BASED_OUTPATIENT_CLINIC_OR_DEPARTMENT_OTHER): Payer: BC Managed Care – PPO | Admitting: Lab

## 2013-04-05 ENCOUNTER — Other Ambulatory Visit: Payer: Self-pay | Admitting: Lab

## 2013-04-05 ENCOUNTER — Ambulatory Visit: Payer: Self-pay | Admitting: Hematology & Oncology

## 2013-04-05 ENCOUNTER — Other Ambulatory Visit (HOSPITAL_COMMUNITY): Payer: BC Managed Care – PPO

## 2013-04-05 ENCOUNTER — Ambulatory Visit (HOSPITAL_BASED_OUTPATIENT_CLINIC_OR_DEPARTMENT_OTHER): Payer: BC Managed Care – PPO

## 2013-04-05 ENCOUNTER — Ambulatory Visit (HOSPITAL_COMMUNITY): Payer: BC Managed Care – PPO

## 2013-04-05 ENCOUNTER — Ambulatory Visit: Payer: Self-pay

## 2013-04-05 VITALS — BP 102/64 | HR 97 | Temp 98.8°F | Resp 20

## 2013-04-05 DIAGNOSIS — Z5111 Encounter for antineoplastic chemotherapy: Secondary | ICD-10-CM

## 2013-04-05 DIAGNOSIS — C9000 Multiple myeloma not having achieved remission: Secondary | ICD-10-CM

## 2013-04-05 DIAGNOSIS — Z5112 Encounter for antineoplastic immunotherapy: Secondary | ICD-10-CM

## 2013-04-05 LAB — CBC WITH DIFFERENTIAL (CANCER CENTER ONLY)
BASO#: 0 10*3/uL (ref 0.0–0.2)
BASO%: 0 % (ref 0.0–2.0)
HCT: 24.5 % — ABNORMAL LOW (ref 38.7–49.9)
HGB: 8.3 g/dL — ABNORMAL LOW (ref 13.0–17.1)
LYMPH#: 0.4 10*3/uL — ABNORMAL LOW (ref 0.9–3.3)
MONO#: 0.5 10*3/uL (ref 0.1–0.9)
NEUT%: 68.4 % (ref 40.0–80.0)
WBC: 3.2 10*3/uL — ABNORMAL LOW (ref 4.0–10.0)

## 2013-04-05 LAB — CMP (CANCER CENTER ONLY)
ALT(SGPT): 18 U/L (ref 10–47)
Albumin: 3.7 g/dL (ref 3.3–5.5)
BUN, Bld: 11 mg/dL (ref 7–22)
CO2: 30 mEq/L (ref 18–33)
Calcium: 9.9 mg/dL (ref 8.0–10.3)
Chloride: 99 mEq/L (ref 98–108)
Creat: 0.9 mg/dl (ref 0.6–1.2)

## 2013-04-05 MED ORDER — ONDANSETRON 8 MG/50ML IVPB (CHCC)
8.0000 mg | Freq: Once | INTRAVENOUS | Status: AC
  Administered 2013-04-05: 8 mg via INTRAVENOUS

## 2013-04-05 MED ORDER — LORAZEPAM 2 MG/ML IJ SOLN
0.5000 mg | Freq: Once | INTRAMUSCULAR | Status: AC
  Administered 2013-04-05: 0.5 mg via INTRAVENOUS

## 2013-04-05 MED ORDER — HEPARIN SOD (PORK) LOCK FLUSH 100 UNIT/ML IV SOLN
500.0000 [IU] | Freq: Once | INTRAVENOUS | Status: AC | PRN
  Administered 2013-04-05: 500 [IU]
  Filled 2013-04-05: qty 5

## 2013-04-05 MED ORDER — SODIUM CHLORIDE 0.9 % IV SOLN
Freq: Once | INTRAVENOUS | Status: AC
  Administered 2013-04-05: 12:00:00 via INTRAVENOUS

## 2013-04-05 MED ORDER — SODIUM CHLORIDE 0.9 % IV SOLN
300.0000 mg/m2 | Freq: Once | INTRAVENOUS | Status: AC
  Administered 2013-04-05: 620 mg via INTRAVENOUS
  Filled 2013-04-05: qty 31

## 2013-04-05 MED ORDER — BORTEZOMIB CHEMO SQ INJECTION 3.5 MG (2.5MG/ML)
1.3000 mg/m2 | Freq: Once | INTRAMUSCULAR | Status: AC
  Administered 2013-04-05: 2.75 mg via SUBCUTANEOUS
  Filled 2013-04-05: qty 2.75

## 2013-04-05 MED ORDER — DEXAMETHASONE SODIUM PHOSPHATE 20 MG/5ML IJ SOLN
40.0000 mg | Freq: Once | INTRAMUSCULAR | Status: AC
  Administered 2013-04-05: 40 mg via INTRAVENOUS
  Filled 2013-04-05: qty 10

## 2013-04-05 MED ORDER — SODIUM CHLORIDE 0.9 % IJ SOLN
10.0000 mL | INTRAMUSCULAR | Status: DC | PRN
  Administered 2013-04-05: 10 mL
  Filled 2013-04-05: qty 10

## 2013-04-05 NOTE — Patient Instructions (Signed)
Bortezomib injection What is this medicine? BORTEZOMIB (bor TEZ oh mib) is a chemotherapy drug. It slows the growth of cancer cells. This medicine is used to treat multiple myeloma, lymphoma, and other cancers. This medicine may be used for other purposes; ask your health care provider or pharmacist if you have questions. What should I tell my health care provider before I take this medicine? They need to know if you have any of these conditions: -heart disease -irregular heartbeat -liver disease -low blood counts, like low white blood cells, platelets, or hemoglobin -peripheral neuropathy -taking medicine for blood pressure -an unusual or allergic reaction to bortezomib, mannitol, boron, other medicines, foods, dyes, or preservatives -pregnant or trying to get pregnant -breast-feeding How should I use this medicine? This medicine is for injection into a vein or for injection under the skin. It is given by a health care professional in a hospital or clinic setting. Talk to your pediatrician regarding the use of this medicine in children. Special care may be needed. Overdosage: If you think you have taken too much of this medicine contact a poison control center or emergency room at once. NOTE: This medicine is only for you. Do not share this medicine with others. What if I miss a dose? It is important not to miss your dose. Call your doctor or health care professional if you are unable to keep an appointment. What may interact with this medicine? -medicines for diabetes -medicines to increase blood counts like filgrastim, pegfilgrastim, sargramostim -zalcitabine Talk to your doctor or health care professional before taking any of these medicines: -acetaminophen -aspirin -ibuprofen -ketoprofen -naproxen This list may not describe all possible interactions. Give your health care provider a list of all the medicines, herbs, non-prescription drugs, or dietary supplements you use. Also  tell them if you smoke, drink alcohol, or use illegal drugs. Some items may interact with your medicine. What should I watch for while using this medicine? Visit your doctor for checks on your progress. This drug may make you feel generally unwell. This is not uncommon, as chemotherapy can affect healthy cells as well as cancer cells. Report any side effects. Continue your course of treatment even though you feel ill unless your doctor tells you to stop. You may get drowsy or dizzy. Do not drive, use machinery, or do anything that needs mental alertness until you know how this medicine affects you. Do not stand or sit up quickly, especially if you are an older patient. This reduces the risk of dizzy or fainting spells. In some cases, you may be given additional medicines to help with side effects. Follow all directions for their use. Call your doctor or health care professional for advice if you get a fever, chills or sore throat, or other symptoms of a cold or flu. Do not treat yourself. This drug decreases your body's ability to fight infections. Try to avoid being around people who are sick. This medicine may increase your risk to bruise or bleed. Call your doctor or health care professional if you notice any unusual bleeding. Be careful brushing and flossing your teeth or using a toothpick because you may get an infection or bleed more easily. If you have any dental work done, tell your dentist you are receiving this medicine. Avoid taking products that contain aspirin, acetaminophen, ibuprofen, naproxen, or ketoprofen unless instructed by your doctor. These medicines may hide a fever. Do not become pregnant while taking this medicine. Women should inform their doctor if they wish to   become pregnant or think they might be pregnant. There is a potential for serious side effects to an unborn child. Talk to your health care professional or pharmacist for more information. Do not breast-feed an infant while  taking this medicine. You may have vomiting or diarrhea while taking this medicine. Drink water or other fluids as directed. What side effects may I notice from receiving this medicine? Side effects that you should report to your doctor or health care professional as soon as possible: -allergic reactions like skin rash, itching or hives, swelling of the face, lips, or tongue -breathing problems -changes in hearing -changes in vision -fast, irregular heartbeat -feeling faint or lightheaded, falls -pain, tingling, numbness in the hands or feet -seizures -swelling of the ankles, feet, hands -unusual bleeding or bruising -unusually weak or tired -vomiting Side effects that usually do not require medical attention (report to your doctor or health care professional if they continue or are bothersome): -changes in emotions or moods -constipation -diarrhea -loss of appetite -headache -irritation at site where injected -nausea This list may not describe all possible side effects. Call your doctor for medical advice about side effects. You may report side effects to FDA at 1-800-FDA-1088. Where should I keep my medicine? This drug is given in a hospital or clinic and will not be stored at home. NOTE: This sheet is a summary. It may not cover all possible information. If you have questions about this medicine, talk to your doctor, pharmacist, or health care provider.  2013, [employer] Standard. (08/25/2010 11:42:36 AM)   Cyclophosphamide injection What is this medicine? CYCLOPHOSPHAMIDE (sye kloe FOSS fa mide) is a chemotherapy drug. It slows the growth of cancer cells. This medicine is used to treat many types of cancer like lymphoma, myeloma, leukemia, breast cancer, and ovarian cancer, to name a few. It is also used to treat nephrotic syndrome in children. This medicine may be used for other purposes; ask your health care provider or pharmacist if you have questions. What should I tell  my health care provider before I take this medicine? They need to know if you have any of these conditions: -blood disorders -history of other chemotherapy -history of radiation therapy -infection -kidney disease -liver disease -tumors in the bone marrow -an unusual or allergic reaction to cyclophosphamide, other chemotherapy, other medicines, foods, dyes, or preservatives -pregnant or trying to get pregnant -breast-feeding How should I use this medicine? This drug is usually given as an injection into a vein or muscle or by infusion into a vein. It is administered in a hospital or clinic by a specially trained health care professional. Talk to your pediatrician regarding the use of this medicine in children. While this drug may be prescribed for selected conditions, precautions do apply. Overdosage: If you think you have taken too much of this medicine contact a poison control center or emergency room at once. NOTE: This medicine is only for you. Do not share this medicine with others. What if I miss a dose? It is important not to miss your dose. Call your doctor or health care professional if you are unable to keep an appointment. What may interact with this medicine? Do not take this medicine with any of the following medications: -mibefradil -nalidixic acid This medicine may also interact with the following medications: -doxorubicin -etanercept -medicines to increase blood counts like filgrastim, pegfilgrastim, sargramostim -medicines that block muscle or nerve pain -St. John's Wort -phenobarbital -succinylcholine chloride -trastuzumab -vaccines Talk to your doctor or health care   professional before taking any of these medicines: -acetaminophen -aspirin -ibuprofen -ketoprofen -naproxen This list may not describe all possible interactions. Give your health care provider a list of all the medicines, herbs, non-prescription drugs, or dietary supplements you use. Also tell them  if you smoke, drink alcohol, or use illegal drugs. Some items may interact with your medicine. What should I watch for while using this medicine? Visit your doctor for checks on your progress. This drug may make you feel generally unwell. This is not uncommon, as chemotherapy can affect healthy cells as well as cancer cells. Report any side effects. Continue your course of treatment even though you feel ill unless your doctor tells you to stop. Drink water or other fluids as directed. Urinate often, even at night. In some cases, you may be given additional medicines to help with side effects. Follow all directions for their use. Call your doctor or health care professional for advice if you get a fever, chills or sore throat, or other symptoms of a cold or flu. Do not treat yourself. This drug decreases your body's ability to fight infections. Try to avoid being around people who are sick. This medicine may increase your risk to bruise or bleed. Call your doctor or health care professional if you notice any unusual bleeding. Be careful brushing and flossing your teeth or using a toothpick because you may get an infection or bleed more easily. If you have any dental work done, tell your dentist you are receiving this medicine. Avoid taking products that contain aspirin, acetaminophen, ibuprofen, naproxen, or ketoprofen unless instructed by your doctor. These medicines may hide a fever. Do not become pregnant while taking this medicine. Women should inform their doctor if they wish to become pregnant or think they might be pregnant. There is a potential for serious side effects to an unborn child. Talk to your health care professional or pharmacist for more information. Do not breast-feed an infant while taking this medicine. Men should inform their doctor if they wish to father a child. This medicine may lower sperm counts. If you are going to have surgery, tell your doctor or health care professional that  you have taken this medicine. What side effects may I notice from receiving this medicine? Side effects that you should report to your doctor or health care professional as soon as possible: -allergic reactions like skin rash, itching or hives, swelling of the face, lips, or tongue -low blood counts - this medicine may decrease the number of white blood cells, red blood cells and platelets. You may be at increased risk for infections and bleeding. -signs of infection - fever or chills, cough, sore throat, pain or difficulty passing urine -signs of decreased platelets or bleeding - bruising, pinpoint red spots on the skin, black, tarry stools, blood in the urine -signs of decreased red blood cells - unusually weak or tired, fainting spells, lightheadedness -breathing problems -dark urine -mouth sores -pain, swelling, redness at site where injected -swelling of the ankles, feet, hands -trouble passing urine or change in the amount of urine -weight gain -yellowing of the eyes or skin Side effects that usually do not require medical attention (report to your doctor or health care professional if they continue or are bothersome): -changes in nail or skin color -diarrhea -hair loss -loss of appetite -missed menstrual periods -nausea, vomiting -stomach pain This list may not describe all possible side effects. Call your doctor for medical advice about side effects. You may report side effects   to FDA at 1-800-FDA-1088. Where should I keep my medicine? This drug is given in a hospital or clinic and will not be stored at home. NOTE: This sheet is a summary. It may not cover all possible information. If you have questions about this medicine, talk to your doctor, pharmacist, or health care provider.  2013, [employer]/Gold Standard. (10/23/2007 2:32:25 PM)  

## 2013-04-09 ENCOUNTER — Ambulatory Visit (HOSPITAL_COMMUNITY)
Admission: RE | Admit: 2013-04-09 | Discharge: 2013-04-09 | Disposition: A | Discharge status: Home / Self Care | Payer: BC Managed Care – PPO | Source: Ambulatory Visit | Attending: Radiation Oncology | Admitting: Radiation Oncology

## 2013-04-09 DIAGNOSIS — C9000 Multiple myeloma not having achieved remission: Secondary | ICD-10-CM | POA: Insufficient documentation

## 2013-04-09 DIAGNOSIS — Z923 Personal history of irradiation: Secondary | ICD-10-CM | POA: Insufficient documentation

## 2013-04-09 DIAGNOSIS — M4804 Spinal stenosis, thoracic region: Secondary | ICD-10-CM | POA: Insufficient documentation

## 2013-04-09 DIAGNOSIS — M549 Dorsalgia, unspecified: Secondary | ICD-10-CM | POA: Insufficient documentation

## 2013-04-09 DIAGNOSIS — Z79899 Other long term (current) drug therapy: Secondary | ICD-10-CM | POA: Insufficient documentation

## 2013-04-09 MED ORDER — GADOBENATE DIMEGLUMINE 529 MG/ML IV SOLN
20.0000 mL | Freq: Once | INTRAVENOUS | Status: AC | PRN
  Administered 2013-04-09: 20 mL via INTRAVENOUS

## 2013-04-10 ENCOUNTER — Ambulatory Visit
Admission: RE | Admit: 2013-04-10 | Discharge: 2013-04-10 | Disposition: A | Discharge status: Home / Self Care | Payer: BC Managed Care – PPO | Source: Ambulatory Visit | Attending: Radiation Oncology | Admitting: Radiation Oncology

## 2013-04-10 DIAGNOSIS — Z923 Personal history of irradiation: Secondary | ICD-10-CM | POA: Insufficient documentation

## 2013-04-10 DIAGNOSIS — C9000 Multiple myeloma not having achieved remission: Secondary | ICD-10-CM

## 2013-04-10 NOTE — Progress Notes (Signed)
  Radiation Oncology         [phone]) [phone] ________________________________  Name: Mark [name] MRN: [id number]  Date: 04/10/2013  DOB: 05-29-1971  SIMULATION AND TREATMENT PLANNING NOTE  DIAGNOSIS:  Multiple myeloma  NARRATIVE:  The patient was brought to the CT Simulation planning suite.  Identity was confirmed.  All relevant records and images related to the planned course of therapy were reviewed.  The patient freely provided informed written consent to proceed with treatment after reviewing the details related to the planned course of therapy. The consent form was witnessed and verified by the simulation staff.  Then, the patient was set-up in a stable reproducible  supine position for radiation therapy.  CT images were obtained.  Surface markings were placed.  The CT images were loaded into the planning software.  Then the target and avoidance structures were contoured.  Treatment planning then occurred.  The radiation prescription was entered and confirmed.  Then, I designed and supervised the construction of a total of 3 medically necessary complex treatment devices.  I have requested : Isodose Plan.  I have ordered:dose calc.  PLAN:  The patient will receive 35 Gy in 14 fractions.  ________________________________   Special treatment procedure note  The patient has had prior radiation treatments to the lower abdomen than upper pelvis region. Additional time was taken in reviewing his previous treatments as it relates to his current setup. Given this the additional time and potential for increased toxicities, this constitutes a special treatment procedure. -----------------------------------  Billie Lade, PhD, MD

## 2013-04-14 ENCOUNTER — Emergency Department (HOSPITAL_COMMUNITY): Payer: BC Managed Care – PPO

## 2013-04-14 ENCOUNTER — Inpatient Hospital Stay (HOSPITAL_COMMUNITY)
Admission: EM | Admit: 2013-04-14 | Discharge: 2013-04-22 | DRG: 403 | Disposition: A | Discharge status: Home / Self Care | Payer: BC Managed Care – PPO | Attending: Hematology & Oncology | Admitting: Hematology & Oncology

## 2013-04-14 ENCOUNTER — Encounter (HOSPITAL_COMMUNITY): Payer: Self-pay

## 2013-04-14 DIAGNOSIS — D6481 Anemia due to antineoplastic chemotherapy: Secondary | ICD-10-CM | POA: Diagnosis present

## 2013-04-14 DIAGNOSIS — R51 Headache: Secondary | ICD-10-CM

## 2013-04-14 DIAGNOSIS — M549 Dorsalgia, unspecified: Secondary | ICD-10-CM

## 2013-04-14 DIAGNOSIS — Z87891 Personal history of nicotine dependence: Secondary | ICD-10-CM

## 2013-04-14 DIAGNOSIS — Q998 Other specified chromosome abnormalities: Secondary | ICD-10-CM

## 2013-04-14 DIAGNOSIS — Z8579 Personal history of other malignant neoplasms of lymphoid, hematopoietic and related tissues: Secondary | ICD-10-CM

## 2013-04-14 DIAGNOSIS — Z79899 Other long term (current) drug therapy: Secondary | ICD-10-CM

## 2013-04-14 DIAGNOSIS — X58XXXA Exposure to other specified factors, initial encounter: Secondary | ICD-10-CM | POA: Diagnosis present

## 2013-04-14 DIAGNOSIS — R519 Headache, unspecified: Secondary | ICD-10-CM

## 2013-04-14 DIAGNOSIS — E860 Dehydration: Secondary | ICD-10-CM

## 2013-04-14 DIAGNOSIS — D649 Anemia, unspecified: Secondary | ICD-10-CM

## 2013-04-14 DIAGNOSIS — S22009A Unspecified fracture of unspecified thoracic vertebra, initial encounter for closed fracture: Secondary | ICD-10-CM | POA: Diagnosis present

## 2013-04-14 DIAGNOSIS — C9000 Multiple myeloma not having achieved remission: Principal | ICD-10-CM | POA: Diagnosis present

## 2013-04-14 DIAGNOSIS — R112 Nausea with vomiting, unspecified: Secondary | ICD-10-CM

## 2013-04-14 DIAGNOSIS — Z923 Personal history of irradiation: Secondary | ICD-10-CM

## 2013-04-14 DIAGNOSIS — T451X5A Adverse effect of antineoplastic and immunosuppressive drugs, initial encounter: Secondary | ICD-10-CM | POA: Diagnosis present

## 2013-04-14 LAB — LIPASE, BLOOD: Lipase: 22 U/L (ref 11–59)

## 2013-04-14 LAB — CBC WITH DIFFERENTIAL/PLATELET
Basophils Absolute: 0 10*3/uL (ref 0.0–0.1)
HCT: 33.4 % — ABNORMAL LOW (ref 39.0–52.0)
Lymphocytes Relative: 15 % (ref 12–46)
Monocytes Absolute: 0.7 10*3/uL (ref 0.1–1.0)
Neutro Abs: 3.2 10*3/uL (ref 1.7–7.7)
Neutrophils Relative %: 70 % (ref 43–77)
RDW: 14.3 % (ref 11.5–15.5)
WBC: 4.5 10*3/uL (ref 4.0–10.5)

## 2013-04-14 LAB — COMPREHENSIVE METABOLIC PANEL
ALT: 13 U/L (ref 0–53)
AST: 18 U/L (ref 0–37)
Albumin: 4.7 g/dL (ref 3.5–5.2)
Alkaline Phosphatase: 131 U/L — ABNORMAL HIGH (ref 39–117)
CO2: 30 mEq/L (ref 19–32)
Chloride: 96 mEq/L (ref 96–112)
Creatinine, Ser: 1.15 mg/dL (ref 0.50–1.35)
GFR calc non Af Amer: 78 mL/min — ABNORMAL LOW (ref >90)
Potassium: 4.2 mEq/L (ref 3.5–5.1)
Sodium: 136 mEq/L (ref 135–145)
Total Bilirubin: 1 mg/dL (ref 0.3–1.2)

## 2013-04-14 LAB — URINALYSIS, ROUTINE W REFLEX MICROSCOPIC
Bilirubin Urine: NEGATIVE
Glucose, UA: NEGATIVE mg/dL
Hgb urine dipstick: NEGATIVE
Ketones, ur: NEGATIVE mg/dL
Protein, ur: NEGATIVE mg/dL
Urobilinogen, UA: 1 mg/dL (ref 0.0–1.0)

## 2013-04-14 LAB — CALCIUM, IONIZED: Calcium, Ion: 1.92 mmol/L (ref 1.12–1.23)

## 2013-04-14 LAB — POCT I-STAT TROPONIN I

## 2013-04-14 LAB — LACTIC ACID, PLASMA: Lactic Acid, Venous: 1.3 mmol/L (ref 0.5–2.2)

## 2013-04-14 MED ORDER — ACETAMINOPHEN 325 MG PO TABS: 650.0000 mg | ORAL_TABLET | Freq: Four times a day (QID) | ORAL | Status: DC | PRN

## 2013-04-14 MED ORDER — VALACYCLOVIR HCL 500 MG PO TABS
500.0000 mg | ORAL_TABLET | Freq: Every day | ORAL | Status: DC
  Administered 2013-04-14 – 2013-04-22 (×9): 500 mg via ORAL
  Filled 2013-04-14 (×9): qty 1

## 2013-04-14 MED ORDER — ACETAMINOPHEN 650 MG RE SUPP: 650.0000 mg | Freq: Four times a day (QID) | RECTAL | Status: DC | PRN

## 2013-04-14 MED ORDER — HYDROMORPHONE HCL PF 1 MG/ML IJ SOLN
1.0000 mg | Freq: Once | INTRAMUSCULAR | Status: AC
  Administered 2013-04-14: 1 mg via INTRAVENOUS
  Filled 2013-04-14: qty 1

## 2013-04-14 MED ORDER — ONDANSETRON HCL 4 MG/2ML IJ SOLN
4.0000 mg | Freq: Once | INTRAMUSCULAR | Status: AC
  Administered 2013-04-14: 4 mg via INTRAVENOUS
  Filled 2013-04-14: qty 2

## 2013-04-14 MED ORDER — LACTULOSE 10 GM/15ML PO SOLN
20.0000 g | Freq: Three times a day (TID) | ORAL | Status: DC
  Filled 2013-04-14 (×26): qty 30

## 2013-04-14 MED ORDER — SODIUM CHLORIDE 0.9 % IV SOLN
90.0000 mg | Freq: Once | INTRAVENOUS | Status: AC
  Administered 2013-04-14: 90 mg via INTRAVENOUS
  Filled 2013-04-14: qty 10

## 2013-04-14 MED ORDER — ONDANSETRON HCL 4 MG PO TABS: 4.0000 mg | ORAL_TABLET | Freq: Four times a day (QID) | ORAL | Status: DC | PRN

## 2013-04-14 MED ORDER — ENOXAPARIN SODIUM 40 MG/0.4ML ~~LOC~~ SOLN
40.0000 mg | SUBCUTANEOUS | Status: DC
  Administered 2013-04-14 – 2013-04-18 (×5): 40 mg via SUBCUTANEOUS
  Filled 2013-04-14 (×9): qty 0.4

## 2013-04-14 MED ORDER — MORPHINE SULFATE 2 MG/ML IJ SOLN
1.0000 mg | INTRAMUSCULAR | Status: DC | PRN
  Administered 2013-04-15 – 2013-04-21 (×2): 1 mg via INTRAVENOUS
  Filled 2013-04-14 (×2): qty 1

## 2013-04-14 MED ORDER — SODIUM CHLORIDE 0.9 % IV BOLUS (SEPSIS)
1000.0000 mL | Freq: Once | INTRAVENOUS | Status: AC
  Administered 2013-04-14: 1000 mL via INTRAVENOUS

## 2013-04-14 MED ORDER — SODIUM CHLORIDE 0.9 % IJ SOLN
3.0000 mL | Freq: Two times a day (BID) | INTRAMUSCULAR | Status: DC
  Administered 2013-04-14 – 2013-04-21 (×6): 3 mL via INTRAVENOUS

## 2013-04-14 MED ORDER — SODIUM CHLORIDE 0.9 % IV SOLN
INTRAVENOUS | Status: DC
  Administered 2013-04-14 – 2013-04-16 (×3): via INTRAVENOUS

## 2013-04-14 MED ORDER — SENNOSIDES-DOCUSATE SODIUM 8.6-50 MG PO TABS
1.0000 | ORAL_TABLET | Freq: Every evening | ORAL | Status: DC | PRN
  Administered 2013-04-18: 1 via ORAL
  Filled 2013-04-14 (×2): qty 1

## 2013-04-14 MED ORDER — FENTANYL 25 MCG/HR TD PT72
25.0000 ug | MEDICATED_PATCH | TRANSDERMAL | Status: DC
  Administered 2013-04-14 – 2013-04-20 (×3): 25 ug via TRANSDERMAL
  Filled 2013-04-14 (×3): qty 1

## 2013-04-14 MED ORDER — OXYCODONE HCL 5 MG PO TABS
5.0000 mg | ORAL_TABLET | ORAL | Status: DC | PRN
  Administered 2013-04-17: 5 mg via ORAL
  Filled 2013-04-14: qty 1

## 2013-04-14 MED ORDER — FAMOTIDINE 20 MG PO TABS
20.0000 mg | ORAL_TABLET | Freq: Two times a day (BID) | ORAL | Status: DC
  Administered 2013-04-14 – 2013-04-22 (×12): 20 mg via ORAL
  Filled 2013-04-14 (×17): qty 1

## 2013-04-14 MED ORDER — ONDANSETRON HCL 4 MG/2ML IJ SOLN
4.0000 mg | Freq: Four times a day (QID) | INTRAMUSCULAR | Status: DC | PRN
  Administered 2013-04-15: 4 mg via INTRAVENOUS
  Filled 2013-04-14: qty 2

## 2013-04-14 NOTE — ED Notes (Signed)
MD at bedside  Pt alert and oriented x4. Respirations even and unlabored, bilateral symmetrical rise and fall of chest. Skin warm and dry. In no acute distress. Denies needs.   

## 2013-04-14 NOTE — ED Notes (Signed)
Pt has hx of multiple myeloma of his back. Reports 7/10 middle right sided back pain. Nausea present as well. Reports weakness. Fell at home due to weakness. Also front headache.

## 2013-04-14 NOTE — ED Notes (Signed)
He states he feels "better", and was just assisted by our tech. To stand and urinate, which he does without problem.  I turn down the lights per his request.

## 2013-04-14 NOTE — ED Notes (Signed)
He states he is having persistent low back pain; and remains in no distress.  An adult male is with him.

## 2013-04-14 NOTE — ED Notes (Signed)
Report given to [place], rn on floor

## 2013-04-14 NOTE — ED Provider Notes (Signed)
CSN: [id number]     Arrival date & time 04/14/13  0803 History   First MD Initiated Contact with Patient 04/14/13 [phone]     Chief Complaint  Patient presents with  . Emesis  . Headache   (Consider location/radiation/quality/duration/timing/severity/associated sxs/prior Treatment) HPI This is a 42 year old male with history of multiple myeloma currently being treated by oncology who presents with headache, emesis, and back pain. The patient reports 2 days of nausea and vomiting. He describes the vomiting is nonbilious, nonbloody. He denies any fevers or abdominal pain. Patient reports a frontal headache without vision changes. Headache is 7/10. He denies any focal weakness or numbness. Patient states that prior to arrival he was in the bathroom and lifted up his arm and had acute onset of midback pain. It is worse with treatment. His pain is 10 out of 10.  Patient recently had an MRI scan that showed multiple thoracic lesions in the spine.  On review of systems, the patient also endorses one week of left-sided chest pain. He states that he told his doctor about this. He he denies any ripping or tearing nature to the pain. He denies any radiation of pain. He is currently denies any chest pain.  Past Medical History  Diagnosis Date  . Cancer   . Multiple myeloma   . History of radiation therapy 02/07/13- 02/26/13    lower L spine, upper sacrum, 35 gray in 14 fractions   Past Surgical History  Procedure Laterality Date  . Portacath placement     Family History  Problem Relation Age of Onset  . Diabetic kidney disease Mother   . Hypertension Mother   . Heart attack Mother   . Kidney failure Mother   . Coronary artery disease Mother   . HIV Father    History  Substance Use Topics  . Smoking status: Former Smoker -- 1.00 packs/day    Types: Cigarettes  . Smokeless tobacco: Never Used  . Alcohol Use: No    Review of Systems  Constitutional: Negative.  Negative for fever.  HENT: Negative  for neck pain.   Eyes: Negative for photophobia and visual disturbance.  Respiratory: Negative.  Negative for chest tightness and shortness of breath.   Cardiovascular: Positive for chest pain. Negative for leg swelling.  Gastrointestinal: Positive for nausea and vomiting. Negative for abdominal pain and diarrhea.  Genitourinary: Negative.  Negative for dysuria.  Musculoskeletal: Positive for back pain.  Skin: Negative for rash.  Neurological: Positive for headaches. Negative for dizziness, weakness and numbness.  Hematological: Positive for adenopathy.  Psychiatric/Behavioral: Negative for confusion. The patient is not nervous/anxious.   All other systems reviewed and are negative.    Allergies  Ibuprofen  Home Medications   Current Outpatient Rx  Name  Route  Sig  Dispense  Refill  . acetaminophen (TYLENOL) 500 MG tablet   Oral   Take 1 tablet (500 mg total) by mouth every 6 (six) hours as needed for pain (Mild pain.).         Marland Kitchen famciclovir (FAMVIR) 500 MG tablet   Oral   Take 0.5 tablets (250 mg total) by mouth daily.   30 tablet   0   . famotidine (PEPCID) 20 MG tablet   Oral   Take 1 tablet (20 mg total) by mouth 2 (two) times daily.   60 tablet   0   . fentaNYL (DURAGESIC - DOSED MCG/HR) 25 MCG/HR patch   Transdermal   Place 1 patch (25 mcg total)  onto the skin every 3 (three) days.   10 patch   0   . lactulose (CHRONULAC) 10 GM/15ML solution   Oral   Take 30 mLs (20 g total) by mouth 3 (three) times daily.   240 mL   4   . oxyCODONE (OXY IR/ROXICODONE) 5 MG immediate release tablet      Take 1-2 pills, IF NEEDED, for pain flareup every 4hr   90 tablet   0   . prochlorperazine (COMPAZINE) 10 MG tablet   Oral   Take 1 tablet (10 mg total) by mouth every 6 (six) hours as needed (Nausea or vomiting).   30 tablet   1    BP 133/76  Pulse 84  Temp(Src) 99.1 F (37.3 C) (Oral)  Resp 15  Ht 5\' 11"  (1.803 m)  Wt 200 lb (90.719 kg)  BMI 27.91 kg/m2   SpO2 95% Physical Exam  Nursing note and vitals reviewed. Constitutional: He is oriented to person, place, and time. He appears well-developed and well-nourished. He appears distressed.  HENT:  Head: Normocephalic and atraumatic.  Mouth/Throat: Oropharynx is clear and moist.  Eyes: EOM are normal. Pupils are equal, round, and reactive to light.  Neck: Neck supple.  Cardiovascular: Normal rate, regular rhythm and normal heart sounds.   No murmur heard. Pulmonary/Chest: Effort normal and breath sounds normal. No respiratory distress. He has no wheezes.  Abdominal: Soft. Bowel sounds are normal. There is no tenderness. There is no rebound.  Musculoskeletal: He exhibits no edema.  Tenderness to palpation of the midline of the mid thoracic spine as well as right of midline.  Lymphadenopathy:    He has no cervical adenopathy.  Neurological: He is alert and oriented to person, place, and time.  4/4 strength in all 4 extremities, no dysmetria noted, gait not tested  Skin: Skin is warm and dry.  Psychiatric: He has a normal mood and affect.    ED Course  Procedures (including critical care time)  CRITICAL CARE Performed by: Ross Marcus, F   Total critical care time: 35 min  Critical care time was exclusive of separately billable procedures and treating other patients.  Critical care was necessary to treat or prevent imminent or life-threatening deterioration.  Critical care was time spent personally by me on the following activities: development of treatment plan with patient and/or surrogate as well as nursing, discussions with consultants, evaluation of patient's response to treatment, examination of patient, obtaining history from patient or surrogate, ordering and performing treatments and interventions, ordering and review of laboratory studies, ordering and review of radiographic studies, pulse oximetry and re-evaluation of patient's condition.  Labs Review Labs Reviewed   CBC WITH DIFFERENTIAL - Abnormal; Notable for the following:    RBC 3.58 (*)    Hemoglobin 11.6 (*)    HCT 33.4 (*)    Monocytes Relative 15 (*)    All other components within normal limits  COMPREHENSIVE METABOLIC PANEL - Abnormal; Notable for the following:    Glucose, Bld 134 (*)    Calcium >15.0 (*)    Alkaline Phosphatase 131 (*)    GFR calc non Af Amer 78 (*)    All other components within normal limits  LIPASE, BLOOD  LACTIC ACID, PLASMA  URINALYSIS, ROUTINE W REFLEX MICROSCOPIC  CALCIUM, IONIZED  POCT I-STAT TROPONIN I   Imaging Review Dg Chest 1 View  04/14/2013   *RADIOLOGY REPORT*  Clinical Data: Emesis, headache, right-sided chest pain  CHEST - 1 VIEW  Comparison:  Prior chest x-ray 02/21/2013  Findings: Right IJ approach single lumen portacatheter in good position with the tip at the superior cavoatrial junction. Inspiratory volumes are low.  There are mild linear opacities in the bilateral bases.  Stable cardiac and mediastinal contours. Negative for pulmonary edema, large pleural effusion or pneumothorax.  Stable healed right sided rib fracture.  IMPRESSION: Linear bibasilar opacities favored to reflect atelectasis.  Early infiltrate difficult to exclude in the appropriate clinical setting.  The tip of the right IJ approach portacatheter is in good position at the superior cavoatrial junction.   Original Report Authenticated By: Malachy Moan, M.D.   Ct Thoracic Spine Wo Contrast  04/14/2013   *RADIOLOGY REPORT*  Clinical Data: multiple myeloma with known spine lesions, presenting today with back pain; evaluate for possible new compression fractures  CT THORACIC SPINE WITHOUT CONTRAST  Technique:  Multidetector CT imaging of the thoracic spine was performed without intravenous contrast administration. Multiplanar CT image reconstructions were also generated  Comparison: 04/09/13 mri  Findings: As described on report recent MRI, there are innumerable lytic lesions throughout  the vertebral bodies of the thoracic spine.  There are also lesions involving spinous process these at T6 and T7, as well as an expansile lesion involving the right transverse process of T1.  There are innumerable smaller lytic lesions throughout the visualized vertebral bodies, transverse process sees, posterior elements, and ribs.  As described on recent MRI, the largest single vertebral body lesion involves the T12 vertebral body, with lytic lesion occupying most of the vertebral body and leading to compression fractures of the superior and inferior endplates, with fracture line extending to involve the anterior cortex of the vertebral body. The posterior cortex is not involved and there is no retropulsion.   A lytic lesion in the anterior T9 vertebral body is also associated with a superior endplate compression fracture.  IMPRESSION: Innumerable lytic lesions throughout the thoracic spine consistent with multiple myeloma.  No change from MRI performed 04/09/2013 showing compression fractures of the superior and inferior T12 end plates due to large myelomatous lesion.  Also no change in a more subtle superior endplate compression fracture at T9, seen better with CT that by MRI.  No new findings when compared to recent MRI.   Original Report Authenticated By: Esperanza Heir, M.D.   EKG independently reviewed by myself: Sinus rhythm with a rate of 84, ST elevation in the anterior and lateral leads which is unchanged from prior. MDM   1. Hypercalcemia   2. History of multiple myeloma   3. Headache   4. Back pain    This is a 42 year old male with history of multiple myeloma who presents with headache, emesis, and back pain. While nontoxic-appearing on exam he does appear very uncomfortable. He is nonfocal. Lab work was obtained including a CBC and a CMP and troponin. CT scan of the thoracic spine was obtained to evaluate for acute fracture given his known lytic lesions. Lab work is notable for a calcium of  greater than 15. Patient was given a second liter of fluids. Ionized calcium was sent. I discussed the patient with his oncologist who recommended giving him pamidronate 90 mg IV. Reassessment of the patient after 2 L of fluid; patient appears more comfortable and reports improvement of his symptoms.  I discussed the patient with the admitting hospitalist who will take over his care.  Cardiac workup is negative. I feel the patient's presentation is most consistent with hypercalcemia secondary to malignancy.  Shon Baton, MD 04/14/13 [phone]

## 2013-04-14 NOTE — H&P (Signed)
[facility]          History and Physical    PCP:   No primary provider on file.   Oncologist: -Dr. Arlan Organ  Chief Complaint:  HA, N/V, Back pain  HPI: Pleasant 42 y/o man with diagnosis of MM currently undergoing chemotherapy. He states for the past 2 days has been having HA and Nausea with occasional emesis consistent of stomach contents. Today, when he was combing his hair, he developed acute onset of back pain. In the ED he was found to have a CA level of >15. We have been asked to admit him for further evaluation and management.  Allergies:   Allergies  Allergen Reactions  . Ibuprofen Nausea Only      Past Medical History  Diagnosis Date  . Cancer   . Multiple myeloma   . History of radiation therapy 02/07/13- 02/26/13    lower L spine, upper sacrum, 35 gray in 14 fractions    Past Surgical History  Procedure Laterality Date  . Portacath placement      Prior to Admission medications   Medication Sig Start Date End Date Taking? Authorizing Provider  acetaminophen (TYLENOL) 500 MG tablet Take 1 tablet (500 mg total) by mouth every 6 (six) hours as needed for pain (Mild pain.). 02/26/13  Yes Elease Etienne, MD  famciclovir (FAMVIR) 500 MG tablet Take 0.5 tablets (250 mg total) by mouth daily. 02/26/13  Yes Elease Etienne, MD  famotidine (PEPCID) 20 MG tablet Take 1 tablet (20 mg total) by mouth 2 (two) times daily. 02/26/13  Yes Elease Etienne, MD  fentaNYL (DURAGESIC - DOSED MCG/HR) 25 MCG/HR patch Place 1 patch (25 mcg total) onto the skin every 3 (three) days. 03/20/13  Yes [name], MD  lactulose (CHRONULAC) 10 GM/15ML solution Take 30 mLs (20 g total) by mouth 3 (three) times daily. 03/20/13  Yes [name], MD  oxyCODONE (OXY IR/ROXICODONE) 5 MG immediate release tablet Take 1-2 pills, IF NEEDED, for pain flareup every 4hr 03/20/13  Yes [name], MD  prochlorperazine (COMPAZINE) 10 MG tablet Take 1 tablet (10 mg total) by  mouth every 6 (six) hours as needed (Nausea or vomiting). 02/23/13  Yes [name], MD    Social History:  reports that he has quit smoking. His smoking use included Cigarettes. He has a 15 pack-year smoking history. He has never used smokeless tobacco. He reports that he does not drink alcohol or use illicit drugs.  Family History  Problem Relation Age of Onset  . Diabetic kidney disease Mother   . Hypertension Mother   . Heart attack Mother   . Kidney failure Mother   . Coronary artery disease Mother   . HIV Father     Review of Systems:  Constitutional: Denies fever, chills, diaphoresis, appetite change and fatigue.  HEENT: Denies photophobia, eye pain, redness, hearing loss, ear pain, congestion, sore throat, rhinorrhea, sneezing, mouth sores, trouble swallowing, neck pain, neck stiffness and tinnitus.   Respiratory: Denies SOB, DOE, cough, chest tightness,  and wheezing.   Cardiovascular: Denies chest pain, palpitations and leg swelling.  Gastrointestinal: Denies abdominal pain, diarrhea, constipation, blood in stool and abdominal distention.  Genitourinary: Denies dysuria, urgency, frequency, hematuria, flank pain and difficulty urinating.  Endocrine: Denies: hot or cold intolerance, sweats, changes in hair or nails, polyuria, polydipsia. Musculoskeletal: Denies myalgias, back pain, joint swelling, arthralgias and gait problem.  Skin: Denies pallor, rash and wound.  Neurological: Denies  dizziness, seizures, syncope, weakness, light-headedness, numbness and headaches.  Hematological: Denies adenopathy. Easy bruising, personal or family bleeding history  Psychiatric/Behavioral: Denies suicidal ideation, mood changes, confusion, nervousness, sleep disturbance and agitation   Physical Exam: Blood pressure 150/76, pulse 79, temperature 98.2 F (36.8 C), temperature source Oral, resp. rate 19, height 5\' 11"  (1.803 m), weight 88.497 kg (195 lb 1.6 oz), SpO2 100.00%. Gen: AA Ox3,  NAD HEENT: Reeder/AT/PERRL/EOMI/dry mucous membranes with cracked lips and tongue. Neck: supple, no JVD, no LAD, no bruits, no goiter. CV: RRR, no M/R/G Lungs: CTA B Abd: S/NT/ND/+BS/no masses Ext: no C/C/E/+pedal pulses Neuro: Grossly intact and non-focal.  Labs on Admission:  Results for orders placed during the hospital encounter of 04/14/13 (from the past 48 hour(s))  CBC WITH DIFFERENTIAL     Status: Abnormal   Collection Time    04/14/13  8:50 AM      Result Value Range   WBC 4.5  4.0 - 10.5 K/uL   RBC 3.58 (*) 4.22 - 5.81 MIL/uL   Hemoglobin 11.6 (*) 13.0 - 17.0 g/dL   HCT 16.1 (*) 09.6 - 04.5 %   MCV 93.3  78.0 - 100.0 fL   MCH 32.4  26.0 - 34.0 pg   MCHC 34.7  30.0 - 36.0 g/dL   RDW 40.9  81.1 - 91.4 %   Platelets 254  150 - 400 K/uL   Neutrophils Relative % 70  43 - 77 %   Neutro Abs 3.2  1.7 - 7.7 K/uL   Lymphocytes Relative 15  12 - 46 %   Lymphs Abs 0.7  0.7 - 4.0 K/uL   Monocytes Relative 15 (*) 3 - 12 %   Monocytes Absolute 0.7  0.1 - 1.0 K/uL   Eosinophils Relative 0  0 - 5 %   Eosinophils Absolute 0.0  0.0 - 0.7 K/uL   Basophils Relative 0  0 - 1 %   Basophils Absolute 0.0  0.0 - 0.1 K/uL  COMPREHENSIVE METABOLIC PANEL     Status: Abnormal   Collection Time    04/14/13  8:50 AM      Result Value Range   Sodium 136  135 - 145 mEq/L   Potassium 4.2  3.5 - 5.1 mEq/L   Chloride 96  96 - 112 mEq/L   CO2 30  19 - 32 mEq/L   Glucose, Bld 134 (*) 70 - 99 mg/dL   BUN 14  6 - 23 mg/dL   Creatinine, Ser 7.82  0.50 - 1.35 mg/dL   Calcium >95.6 (*) 8.4 - 10.5 mg/dL   Comment: REPEATED TO VERIFY     CRITICAL RESULT CALLED TO, READ BACK BY AND VERIFIED WITH:     TIM SMITH,RN [id number] @ 0956 BY J SCOTTON   Total Protein 7.5  6.0 - 8.3 g/dL   Albumin 4.7  3.5 - 5.2 g/dL   AST 18  0 - 37 U/L   ALT 13  0 - 53 U/L   Alkaline Phosphatase 131 (*) 39 - 117 U/L   Total Bilirubin 1.0  0.3 - 1.2 mg/dL   GFR calc non Af Amer 78 (*) >90 mL/min   GFR calc Af Amer >90  >90 mL/min    Comment: (NOTE)     The eGFR has been calculated using the CKD EPI equation.     This calculation has not been validated in all clinical situations.     eGFR's persistently <90 mL/min signify possible Chronic Kidney  Disease.  LIPASE, BLOOD     Status: None   Collection Time    04/14/13  8:50 AM      Result Value Range   Lipase 22  11 - 59 U/L  LACTIC ACID, PLASMA     Status: None   Collection Time    04/14/13  8:50 AM      Result Value Range   Lactic Acid, Venous 1.3  0.5 - 2.2 mmol/L  POCT I-STAT TROPONIN I     Status: None   Collection Time    04/14/13  9:08 AM      Result Value Range   Troponin i, poc 0.00  0.00 - 0.08 ng/mL   Comment 3            Comment: Due to the release kinetics of cTnI,     a negative result within the first hours     of the onset of symptoms does not rule out     myocardial infarction with certainty.     If myocardial infarction is still suspected,     repeat the test at appropriate intervals.  URINALYSIS, ROUTINE W REFLEX MICROSCOPIC     Status: Abnormal   Collection Time    04/14/13 11:03 AM      Result Value Range   Color, Urine YELLOW  YELLOW   APPearance CLOUDY (*) CLEAR   Specific Gravity, Urine 1.021  1.005 - 1.030   pH 6.0  5.0 - 8.0   Glucose, UA NEGATIVE  NEGATIVE mg/dL   Hgb urine dipstick NEGATIVE  NEGATIVE   Bilirubin Urine NEGATIVE  NEGATIVE   Ketones, ur NEGATIVE  NEGATIVE mg/dL   Protein, ur NEGATIVE  NEGATIVE mg/dL   Urobilinogen, UA 1.0  0.0 - 1.0 mg/dL   Nitrite NEGATIVE  NEGATIVE   Leukocytes, UA NEGATIVE  NEGATIVE   Comment: MICROSCOPIC NOT DONE ON URINES WITH NEGATIVE PROTEIN, BLOOD, LEUKOCYTES, NITRITE, OR GLUCOSE <1000 mg/dL.    Radiological Exams on Admission: Dg Chest 1 View  04/14/2013   *RADIOLOGY REPORT*  Clinical Data: Emesis, headache, right-sided chest pain  CHEST - 1 VIEW  Comparison: Prior chest x-ray 02/21/2013  Findings: Right IJ approach single lumen portacatheter in good position with the tip at  the superior cavoatrial junction. Inspiratory volumes are low.  There are mild linear opacities in the bilateral bases.  Stable cardiac and mediastinal contours. Negative for pulmonary edema, large pleural effusion or pneumothorax.  Stable healed right sided rib fracture.  IMPRESSION: Linear bibasilar opacities favored to reflect atelectasis.  Early infiltrate difficult to exclude in the appropriate clinical setting.  The tip of the right IJ approach portacatheter is in good position at the superior cavoatrial junction.   Original Report Authenticated By: Malachy Moan, M.D.   Ct Thoracic Spine Wo Contrast  04/14/2013   *RADIOLOGY REPORT*  Clinical Data: multiple myeloma with known spine lesions, presenting today with back pain; evaluate for possible new compression fractures  CT THORACIC SPINE WITHOUT CONTRAST  Technique:  Multidetector CT imaging of the thoracic spine was performed without intravenous contrast administration. Multiplanar CT image reconstructions were also generated  Comparison: 04/09/13 mri  Findings: As described on report recent MRI, there are innumerable lytic lesions throughout the vertebral bodies of the thoracic spine.  There are also lesions involving spinous process these at T6 and T7, as well as an expansile lesion involving the right transverse process of T1.  There are innumerable smaller lytic lesions throughout the visualized vertebral bodies, transverse  process sees, posterior elements, and ribs.  As described on recent MRI, the largest single vertebral body lesion involves the T12 vertebral body, with lytic lesion occupying most of the vertebral body and leading to compression fractures of the superior and inferior endplates, with fracture line extending to involve the anterior cortex of the vertebral body. The posterior cortex is not involved and there is no retropulsion.   A lytic lesion in the anterior T9 vertebral body is also associated with a superior endplate compression  fracture.  IMPRESSION: Innumerable lytic lesions throughout the thoracic spine consistent with multiple myeloma.  No change from MRI performed 04/09/2013 showing compression fractures of the superior and inferior T12 end plates due to large myelomatous lesion.  Also no change in a more subtle superior endplate compression fracture at T9, seen better with CT that by MRI.  No new findings when compared to recent MRI.   Original Report Authenticated By: Esperanza Heir, M.D.    Assessment/Plan Principal Problem:   Hypercalcemia Active Problems:   Nausea with vomiting   Multiple myeloma   Headache(784.0)   Back pain  Hypercalcemia -From MM and lytic lesions. -Aggressive IVF hydration. -Bisphosphonate (Pamidronate has been ordered in ED). -Recheck levels in am.  HA/N/V -2/2 hypercalcemia  Back Pain -Mostly resolved at this point. -Multiple lytic lesions seen on T-spine CT are presumed to be the cause. -Treat PRN with pain meds.  MM -Continue treatment as per oncologist.  DVT Prophylaxis -Lovenox  Code Status -Full code.   Time Spent on Admission: 75 minutes  HERNANDEZ ACOSTA,ESTELA [facility] Pager: [phone] 04/14/2013, 2:40 PM

## 2013-04-14 NOTE — ED Notes (Signed)
Patient reports vomiting, abdominal pain, and headache x 2 days. Patient denise any diarrhea or blood in his vomit.

## 2013-04-15 ENCOUNTER — Ambulatory Visit
Admission: RE | Admit: 2013-04-15 | Discharge: 2013-04-15 | Disposition: A | Discharge status: Home / Self Care | Payer: BC Managed Care – PPO | Source: Ambulatory Visit | Attending: Radiation Oncology | Admitting: Radiation Oncology

## 2013-04-15 ENCOUNTER — Inpatient Hospital Stay (HOSPITAL_COMMUNITY): Payer: BC Managed Care – PPO

## 2013-04-15 ENCOUNTER — Encounter: Payer: Self-pay | Admitting: Radiation Oncology

## 2013-04-15 DIAGNOSIS — M549 Dorsalgia, unspecified: Secondary | ICD-10-CM

## 2013-04-15 DIAGNOSIS — C9000 Multiple myeloma not having achieved remission: Principal | ICD-10-CM

## 2013-04-15 DIAGNOSIS — E860 Dehydration: Secondary | ICD-10-CM

## 2013-04-15 LAB — COMPREHENSIVE METABOLIC PANEL
ALT: 11 U/L (ref 0–53)
AST: 15 U/L (ref 0–37)
CO2: 31 mEq/L (ref 19–32)
Chloride: 99 mEq/L (ref 96–112)
Creatinine, Ser: 1.05 mg/dL (ref 0.50–1.35)
GFR calc non Af Amer: 87 mL/min — ABNORMAL LOW (ref >90)
Total Bilirubin: 0.8 mg/dL (ref 0.3–1.2)

## 2013-04-15 LAB — CBC
MCH: 32.2 pg (ref 26.0–34.0)
MCHC: 34.3 g/dL (ref 30.0–36.0)
Platelets: 176 10*3/uL (ref 150–400)
RBC: 2.95 MIL/uL — ABNORMAL LOW (ref 4.22–5.81)

## 2013-04-15 MED ORDER — TECHNETIUM TC 99M-LABELED RED BLOOD CELLS IV KIT
26.4000 | PACK | Freq: Once | INTRAVENOUS | Status: AC | PRN
  Administered 2013-04-15: 26.4 via INTRAVENOUS

## 2013-04-15 MED ORDER — ONDANSETRON 8 MG/NS 50 ML IVPB
8.0000 mg | Freq: Four times a day (QID) | INTRAVENOUS | Status: DC | PRN
  Administered 2013-04-19 – 2013-04-20 (×2): 8 mg via INTRAVENOUS
  Filled 2013-04-15 (×3): qty 8

## 2013-04-15 MED ORDER — PROMETHAZINE HCL 25 MG/ML IJ SOLN
25.0000 mg | Freq: Four times a day (QID) | INTRAMUSCULAR | Status: DC | PRN
  Administered 2013-04-15: 25 mg via INTRAVENOUS
  Administered 2013-04-19: 12.5 mg via INTRAVENOUS
  Administered 2013-04-22: 25 mg via INTRAVENOUS
  Filled 2013-04-15 (×3): qty 1

## 2013-04-15 MED ORDER — DEXAMETHASONE SODIUM PHOSPHATE 10 MG/ML IJ SOLN
20.0000 mg | Freq: Once | INTRAMUSCULAR | Status: AC
  Administered 2013-04-15: 20 mg via INTRAVENOUS
  Filled 2013-04-15 (×2): qty 2

## 2013-04-15 MED ORDER — ONDANSETRON HCL 4 MG PO TABS: 4.0000 mg | ORAL_TABLET | Freq: Four times a day (QID) | ORAL | Status: DC | PRN

## 2013-04-15 MED ORDER — CALCITONIN (SALMON) 200 UNIT/ML IJ SOLN
480.0000 [IU] | Freq: Two times a day (BID) | INTRAMUSCULAR | Status: AC
  Administered 2013-04-15 (×2): 480 [IU] via SUBCUTANEOUS
  Filled 2013-04-15 (×3): qty 2.4

## 2013-04-15 MED ORDER — ONDANSETRON HCL 4 MG/2ML IJ SOLN: 4.0000 mg | Freq: Four times a day (QID) | INTRAMUSCULAR | Status: DC | PRN

## 2013-04-15 NOTE — Consult Note (Signed)
#   [id number] is consult note.  His myeloma is showing itself to be very resilient and probably refractory. His hypercalcemia is a good sign of this.  We will have to move ahead and plan much more aggressive therapy. I will plan to use the VD-PACE program. This is almost a leukemic- type program. He still has a good performance status.  His calcium is better. I probably would also utilize calcitonin. He was response to this.  I will reevaluate his myeloma with blood and urine studies.  He probably can be taken off monitor. He can be moved downstairs to the third floor in anticipation of chemotherapy.  We needed MUGA scan on him.   Once he is moving to the third floor, I'll be more happy to be attending.  I appreciate the hospitalist helping me out so much!!  Pete E.  Psalm 30:2

## 2013-04-15 NOTE — Progress Notes (Addendum)
[facility] PROGRESS NOTE  Mark [name] MWU:[id number] DOB: 19-May-1971 DOA: 04/14/2013 PCP: No primary provider on file.  Assessment/Plan: Hypercalcemia  - Improved this morning to 12.9 -From MM and lytic lesions.  -Aggressive IVF hydration.  -Bisphosphonate given - Started on calcitonin this am. - Follow CMP in am.  HA/N/V  -2/2 hypercalcemia  - improved.  Back Pain  -Mostly resolved at this point.  -Multiple lytic lesions seen on T-spine CT are presumed to be the cause.  -Treat PRN with pain meds.   MM  -Continue treatment as per oncologist.   DVT Prophylaxis  -Lovenox  Will transfer to third floor and transfer the service to Dr Myna Hidalgo, as per his recommendation.  Code Status: Full Family Communication: *Discussed with patient in detail Disposition Plan: TBD   Consultants:  Oncology  Procedures:  None  Antibiotics:  None  HPI/Subjective: *42 y/o male with h/o Multiple Myeloma admitted with hypercalcemia. Calcium has now improved with pamidronate.No complaints this morning.  Objective: Filed Vitals:   04/15/13 0529  BP: 120/71  Pulse: 74  Temp: 98.4 F (36.9 C)  Resp: 16    Intake/Output Summary (Last 24 hours) at 04/15/13 1005 Last data filed at 04/14/13 2300  Gross per 24 hour  Intake 1173.33 ml  Output      0 ml  Net 1173.33 ml   Filed Weights   04/14/13 0821 04/14/13 1253  Weight: 90.719 kg (200 lb) 88.497 kg (195 lb 1.6 oz)    Exam:   General:  Appear in no acute distress  Cardiovascular: *S1s2 RRR  Respiratory: Clear bilaterally  Abdomen: Soft, nontender  Musculoskeletal: *No edema  Data Reviewed: Basic Metabolic Panel:  Recent Labs Lab 04/14/13 0850 04/15/13 0644  NA 136 136  K 4.2 4.2  CL 96 99  CO2 30 31  GLUCOSE 134* 96  BUN 14 9  CREATININE 1.15 1.05  CALCIUM >15.0* 12.9*   Liver Function Tests:  Recent Labs Lab 04/14/13 0850 04/15/13 0644  AST 18 15  ALT 13 11  ALKPHOS 131* 103  BILITOT  1.0 0.8  PROT 7.5 6.2  ALBUMIN 4.7 3.7    Recent Labs Lab 04/14/13 0850  LIPASE 22   No results found for this basename: AMMONIA,  in the last 168 hours CBC:  Recent Labs Lab 04/14/13 0850 04/15/13 0644  WBC 4.5 2.5*  NEUTROABS 3.2  --   HGB 11.6* 9.5*  HCT 33.4* 27.7*  MCV 93.3 93.9  PLT 254 176   Cardiac Enzymes: No results found for this basename: CKTOTAL, CKMB, CKMBINDEX, TROPONINI,  in the last 168 hours BNP (last 3 results) No results found for this basename: PROBNP,  in the last 8760 hours CBG: No results found for this basename: GLUCAP,  in the last 168 hours  No results found for this or any previous visit (from the past 240 hour(s)).   Studies: Dg Chest 1 View  04/14/2013   *RADIOLOGY REPORT*  Clinical Data: Emesis, headache, right-sided chest pain  CHEST - 1 VIEW  Comparison: Prior chest x-ray 02/21/2013  Findings: Right IJ approach single lumen portacatheter in good position with the tip at the superior cavoatrial junction. Inspiratory volumes are low.  There are mild linear opacities in the bilateral bases.  Stable cardiac and mediastinal contours. Negative for pulmonary edema, large pleural effusion or pneumothorax.  Stable healed right sided rib fracture.  IMPRESSION: Linear bibasilar opacities favored to reflect atelectasis.  Early infiltrate difficult to exclude in the appropriate clinical setting.  The tip of the right IJ approach portacatheter is in good position at the superior cavoatrial junction.   Original Report Authenticated By: Malachy Moan, M.D.   Ct Thoracic Spine Wo Contrast  04/14/2013   *RADIOLOGY REPORT*  Clinical Data: multiple myeloma with known spine lesions, presenting today with back pain; evaluate for possible new compression fractures  CT THORACIC SPINE WITHOUT CONTRAST  Technique:  Multidetector CT imaging of the thoracic spine was performed without intravenous contrast administration. Multiplanar CT image reconstructions were also  generated  Comparison: 04/09/13 mri  Findings: As described on report recent MRI, there are innumerable lytic lesions throughout the vertebral bodies of the thoracic spine.  There are also lesions involving spinous process these at T6 and T7, as well as an expansile lesion involving the right transverse process of T1.  There are innumerable smaller lytic lesions throughout the visualized vertebral bodies, transverse process sees, posterior elements, and ribs.  As described on recent MRI, the largest single vertebral body lesion involves the T12 vertebral body, with lytic lesion occupying most of the vertebral body and leading to compression fractures of the superior and inferior endplates, with fracture line extending to involve the anterior cortex of the vertebral body. The posterior cortex is not involved and there is no retropulsion.   A lytic lesion in the anterior T9 vertebral body is also associated with a superior endplate compression fracture.  IMPRESSION: Innumerable lytic lesions throughout the thoracic spine consistent with multiple myeloma.  No change from MRI performed 04/09/2013 showing compression fractures of the superior and inferior T12 end plates due to large myelomatous lesion.  Also no change in a more subtle superior endplate compression fracture at T9, seen better with CT that by MRI.  No new findings when compared to recent MRI.   Original Report Authenticated By: Esperanza Heir, M.D.    Scheduled Meds: . calcitonin  480 Units Subcutaneous BID  . enoxaparin (LOVENOX) injection  40 mg Subcutaneous Q24H  . famotidine  20 mg Oral BID  . fentaNYL  25 mcg Transdermal Q72H  . lactulose  20 g Oral TID  . sodium chloride  3 mL Intravenous Q12H  . valACYclovir  500 mg Oral Daily   Continuous Infusions: . sodium chloride 125 mL/hr at 04/14/13 2300    Principal Problem:   Hypercalcemia Active Problems:   Nausea with vomiting   Multiple myeloma   Headache(784.0)   Back  pain    Time spent: 35 min    [facility]  [facility] Pager [phone]*. If 7PM-7AM, please contact night-coverage at [email or web address], password [facility] 04/15/2013, 10:05 AM  LOS: 1 day

## 2013-04-15 NOTE — Consult Note (Signed)
Mark [name], Mark [name]                 ACCOUNT NO.:  [id number]  MEDICAL RECORD NO.:  [id number]  LOCATION:  1427                         FACILITY:  [facility]  PHYSICIAN:  [name], M.D.  DATE OF BIRTH:  [birth year]  DATE OF CONSULTATION:  04/15/2013 DATE OF DISCHARGE:                                CONSULTATION   REASON FOR CONSULTATION: 1. Recurrent hypercalcemia. 2. Kappa light chain myeloma.  HISTORY OF PRESENT ILLNESS:  Mark [name] is a very nice 42 year old African American gentleman.  I initially saw him back in July 4.  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  He was diagnosed with myeloma.  He had a kappa light chain myeloma.  He has had multiple negative cytogenetic abnormalities.  He has been treated with chemotherapy with Velcade/Cytoxan/Decadron.  His calcium have been controlled.  Unfortunately, now comes back in with calcium over 15.  He has had nausea, vomiting for 2 days.  He has had a poor appetite.  He has had some slight increase in back discomfort.  When we last checked his myeloma studies, his kappa light chain in the serum was 51 mg/dL.  This really has not changed since we initially started treating him.  He had a CT of the spine on the 14th.  This showed innumerable lytic lesions.  There was no change for MRI performed on September 9.  He had a compression fracture of this superior and inferior T12 endplate due to a large myelomatous lesion.  Again, no new findings were noted.  He had an MRI of the thoracic spine.  This showed multiple myelomatous like lesions.  Largest lesion was at T12.  There is no epidural tumor noted.  He now is admitted.  He has got IV fluids.  He has got Aredia.  I do not think he has gotten salmon calcitonin.  MEDICAL HISTORY:  Pretty much unremarkable.  ALLERGIES:  Motrin.  MEDICATIONS: 1. Fentanyl patch 25 mcg q.3 days. 2. Famvir  250 mg p.o. daily. 3. Pepcid 20 mg p.o. b.i.d. 4. OxyIR 5 mg p.o. q.6 hours p.r.n. 5. Compazine 10 mg p.o. q.6 hours p.r.n.  SOCIAL HISTORY:  Remarkable for tobacco use.  He has a 15-pack-year history of tobacco use.  There is no alcohol use.  There is no occupational exposure.  FAMILY HISTORY:  Noncontributory.  There is history of diabetes and hypertension in the family.  REVIEW OF SYSTEMS:  As stated in history of present illness.  He does have nausea and vomiting.  He does have some back pain.  He does have fatigue.  He has had no fever.  He has had no bleeding.  PHYSICAL EXAMINATION:  GENERAL:  This is a well-developed, well- nourished African American gentleman in no obvious distress.  VITAL SIGNS:  Temperature of 98.4, pulse 74, respiratory rate 16, blood pressure 120/71.  HEAD AND NECK:  No ocular or oral lesions.  He has no palpable cervical or supraclavicular lymph nodes.  LUNGS:  Clear bilaterally.  CARDIAC:  Regular rate and rhythm with normal S1, S2. There are no murmurs, rubs, or bruits.  ABDOMEN:  Soft.  He has good bowel sounds.  There is no fluid wave.  There is no palpable hepatosplenomegaly.  EXTREMITIES:  No clubbing, cyanosis, or edema.  He has good strength in his legs.  He has good range of motion of his joints.  SKIN:  No rashes, ecchymosis, or petechia.  NEUROLOGICAL: Shows no focal neurological deficits.  LABORATORY STUDIES:  White cell count is 2.5, hemoglobin 9.5, hematocrit 27.7, platelet count 176.  Sodium 136, potassium 4.2, BUN 9, creatinine 0.1.  Calcium 12.9 with an albumin of 3.7.  IMPRESSION:  Mark [name] is a 42 year old gentleman with kappa light chain myeloma.  His disease clearly is resilient.  One might even say refractory.  His response now has been transient.  Again, I suspect that we actually may be dealing with plasma cell leukemia.  This can tend to be more resistant.  He does have multiple adverse chromosomal abnormalities with  his myeloma.  I will go ahead repeat a 24-hour urine on him.  I want another bone survey on him.  We will see what his serum kappa light chains are.  I want to get a MUGA scan on him.  I think we are going to have to move ahead with more aggressive therapy. I will probably need to utilize a "leukemic type regimen" for this.  I will go ahead and give him VD-PACE.  I think this should be appropriate.  We need to get him to a much better remission, so we give him transplant.  Again, he has got a bad disease.  This is showing itself to be quite resilient.  We will follow him along very closely.  I think if I get off the monitored floor, we need to get him down to the third floor, so we can anticipate starting him on treatment in the next day or so.  I very much appreciate the great care that he has been getting from the hospitalist and by the staff on [facility].  I spent a good hour with him today.  He understands the situation that we are we are in.  His performance status is still quite good.     [name], M.D.     PRE/MEDQ  D:  04/15/2013  T:  04/15/2013  Job:  [id number]

## 2013-04-15 NOTE — Care Management Note (Signed)
   CARE MANAGEMENT NOTE 04/15/2013  Patient:  [facility]   Account Number:  [id number]  Date Initiated:  04/15/2013  Documentation initiated by:  Athalie Newhard  Subjective/Objective Assessment:   42 yo male admitted with hypercalcemia.     Action/Plan:   Home when stable   Anticipated DC Date:     Anticipated DC Plan:  HOME/SELF CARE      DC Planning Services  CM consult      Choice offered to / List presented to:  NA   DME arranged  NA      DME agency  NA     HH arranged  NA      HH agency  NA   Status of service:  In process, will continue to follow Medicare Important Message given?   (If response is "NO", the following Medicare IM given date fields will be blank) Date Medicare IM given:   Date Additional Medicare IM given:    Discharge Disposition:    Per UR Regulation:  Reviewed for med. necessity/level of care/duration of stay  If discussed at Long Length of Stay Meetings, dates discussed:    Comments:  9/15 14 1300 Natajah Derderian,RN,MSN [phone] Chart reviewed for utilization of services. No PCP on record. Cm to assess for PCP establishment prior to discharge.

## 2013-04-16 ENCOUNTER — Ambulatory Visit (HOSPITAL_BASED_OUTPATIENT_CLINIC_OR_DEPARTMENT_OTHER): Admission: RE | Admit: 2013-04-16 | Payer: BC Managed Care – PPO | Source: Ambulatory Visit

## 2013-04-16 ENCOUNTER — Ambulatory Visit: Payer: BC Managed Care – PPO

## 2013-04-16 DIAGNOSIS — Z5111 Encounter for antineoplastic chemotherapy: Secondary | ICD-10-CM

## 2013-04-16 DIAGNOSIS — M899 Disorder of bone, unspecified: Secondary | ICD-10-CM

## 2013-04-16 LAB — CBC
Hemoglobin: 10.2 g/dL — ABNORMAL LOW (ref 13.0–17.0)
MCHC: 34.9 g/dL (ref 30.0–36.0)
Platelets: UNDETERMINED 10*3/uL (ref 150–400)
RBC: 3.16 MIL/uL — ABNORMAL LOW (ref 4.22–5.81)

## 2013-04-16 LAB — BASIC METABOLIC PANEL
CO2: 25 mEq/L (ref 19–32)
GFR calc non Af Amer: >90 mL/min (ref >90)
Glucose, Bld: 127 mg/dL — ABNORMAL HIGH (ref 70–99)
Potassium: 4.4 mEq/L (ref 3.5–5.1)
Sodium: 137 mEq/L (ref 135–145)

## 2013-04-16 MED ORDER — HEPARIN SOD (PORK) LOCK FLUSH 100 UNIT/ML IV SOLN: 250.0000 [IU] | Freq: Once | INTRAVENOUS | Status: AC | PRN

## 2013-04-16 MED ORDER — PALONOSETRON HCL INJECTION 0.25 MG/5ML
0.2500 mg | INTRAVENOUS | Status: AC
  Administered 2013-04-16 – 2013-04-18 (×2): 0.25 mg via INTRAVENOUS
  Filled 2013-04-16 (×2): qty 5

## 2013-04-16 MED ORDER — SODIUM CHLORIDE 0.9 % IV SOLN: INTRAVENOUS | Status: DC

## 2013-04-16 MED ORDER — SODIUM CHLORIDE 0.9 % IJ SOLN
10.0000 mL | INTRAMUSCULAR | Status: DC | PRN
Start: 2013-04-16 — End: 2013-04-22

## 2013-04-16 MED ORDER — BORTEZOMIB CHEMO IV INJECTION 3.5 MG
1.3000 mg/m2 | INTRAMUSCULAR | Status: AC
  Administered 2013-04-16 – 2013-04-19 (×2): 2.7 mg via INTRAVENOUS
  Filled 2013-04-16 (×2): qty 2.7

## 2013-04-16 MED ORDER — DEXAMETHASONE 6 MG PO TABS
40.0000 mg | ORAL_TABLET | ORAL | Status: AC
  Administered 2013-04-16 – 2013-04-19 (×4): 40 mg via ORAL
  Filled 2013-04-16 (×5): qty 1

## 2013-04-16 MED ORDER — LORAZEPAM 0.5 MG PO TABS: 0.5000 mg | ORAL_TABLET | Freq: Four times a day (QID) | ORAL | Status: DC | PRN

## 2013-04-16 MED ORDER — PROCHLORPERAZINE MALEATE 10 MG PO TABS: 10.0000 mg | ORAL_TABLET | Freq: Four times a day (QID) | ORAL | Status: DC | PRN

## 2013-04-16 MED ORDER — SODIUM CHLORIDE 0.9 % IJ SOLN: 3.0000 mL | INTRAMUSCULAR | Status: DC | PRN

## 2013-04-16 MED ORDER — SODIUM CHLORIDE 0.9 % IV SOLN
150.0000 mg | Freq: Once | INTRAVENOUS | Status: AC
  Administered 2013-04-16: 150 mg via INTRAVENOUS
  Filled 2013-04-16: qty 5

## 2013-04-16 MED ORDER — COLD PACK MISC ONCOLOGY
1.0000 | Freq: Once | Status: AC | PRN
  Filled 2013-04-16: qty 1

## 2013-04-16 MED ORDER — HOT PACK MISC ONCOLOGY
1.0000 | Freq: Once | Status: AC | PRN
  Filled 2013-04-16: qty 1

## 2013-04-16 MED ORDER — ALTEPLASE 2 MG IJ SOLR
2.0000 mg | Freq: Once | INTRAMUSCULAR | Status: AC | PRN
  Filled 2013-04-16: qty 2

## 2013-04-16 MED ORDER — SODIUM CHLORIDE 0.9 % IJ SOLN
10.0000 mL | INTRAMUSCULAR | Status: DC | PRN
  Administered 2013-04-20: 10 mL

## 2013-04-16 MED ORDER — SODIUM CHLORIDE 0.9 % IV SOLN
10.0000 mg/m2 | INTRAVENOUS | Status: AC
  Administered 2013-04-16 – 2013-04-19 (×4): 22 mg via INTRAVENOUS
  Filled 2013-04-16 (×4): qty 11

## 2013-04-16 MED ORDER — SODIUM CHLORIDE 0.9 % IV SOLN
INTRAVENOUS | Status: AC
  Administered 2013-04-16 – 2013-04-18 (×3): 21 mg via INTRAVENOUS
  Administered 2013-04-19: 945 mg via INTRAVENOUS
  Filled 2013-04-16 (×4): qty 21

## 2013-04-16 MED ORDER — FLUCONAZOLE 100 MG PO TABS
100.0000 mg | ORAL_TABLET | Freq: Every day | ORAL | Status: DC
  Administered 2013-04-16 – 2013-04-22 (×7): 100 mg via ORAL
  Filled 2013-04-16 (×7): qty 1

## 2013-04-16 MED ORDER — POTASSIUM CHLORIDE 2 MEQ/ML IV SOLN
INTRAVENOUS | Status: AC
  Administered 2013-04-16 – 2013-04-18 (×7): via INTRAVENOUS
  Filled 2013-04-16 (×7): qty 10

## 2013-04-16 MED ORDER — HEPARIN SOD (PORK) LOCK FLUSH 100 UNIT/ML IV SOLN: 500.0000 [IU] | Freq: Once | INTRAVENOUS | Status: AC | PRN

## 2013-04-16 NOTE — Care Management Note (Signed)
Cm spoke with patient at bedside to assess for dc needs. No PCP on record. Pt gave Cm permission to provide pt with list of providers within network. No other HH services or dme needs identified.   Roxy Manns Anthonette Lesage,RN,MSN [phone]

## 2013-04-16 NOTE — Progress Notes (Signed)
[name] is now on 3 E. We will start him on chemotherapy today. His MUGA scan showed an ejection fraction of 70%. He had a PICC line put in so that he can receive his IV via continuous infusion.  His calcium is 11.1. His nausea little better.  The 24 hour urine is in progress. The rest of his labwork is pending. His bone survey does show some new lytic lesions.  It is apparent that his myeloma is no longer responsive to his first-line chemotherapy. This is why we are switching to a much more aggressive approach with VD-PACE.  I think we should be able to get a response with this. And then get him to an autologous bone marrow transplant.  His vital signs are all stable. Blood pressure 129/75. His lungs are clear bilaterally. Cardiac exam regular rate and rhythm. Abdomen is soft. Has good bowel sounds. There is no palpable hepatosplenomegaly. Extremities shows no clubbing cyanosis. He has good strength in his legs. Skin shows no rashes. There is no neurological findings.  I spoke to he and his wife at length this morning. I gave him the recommendations I had. I explained to them the chemotherapy and side effects. They understand this fully.  We will proceed with chemotherapy starting today. Will be hospitalized for another 5 or 6 days.  We will continue to monitor his electrolytes and CBC.  He's on Valtrex already. I will start him on Diflucan. He will need Levaquin as an outpatient.  I spent a good half hour so with he and his wife this morning.  Mark E.  Joshua 1:9

## 2013-04-16 NOTE — Progress Notes (Signed)
Peripherally Inserted Central Catheter/Midline Placement  The IV Nurse has discussed with the patient and/or persons authorized to consent for the patient, the purpose of this procedure and the potential benefits and risks involved with this procedure.  The benefits include less needle sticks, lab draws from the catheter and patient may be discharged home with the catheter.  Risks include, but not limited to, infection, bleeding, blood clot (thrombus formation), and puncture of an artery; nerve damage and irregular heat beat.  Alternatives to this procedure were also discussed.  PICC/Midline Placement Documentation        Mellissa Kohut 04/16/2013, 9:16 AM

## 2013-04-17 ENCOUNTER — Ambulatory Visit: Payer: BC Managed Care – PPO

## 2013-04-17 DIAGNOSIS — D649 Anemia, unspecified: Secondary | ICD-10-CM

## 2013-04-17 LAB — BASIC METABOLIC PANEL
CO2: 23 mEq/L (ref 19–32)
Calcium: 9.6 mg/dL (ref 8.4–10.5)
Chloride: 106 mEq/L (ref 96–112)
Glucose, Bld: 262 mg/dL — ABNORMAL HIGH (ref 70–99)
Sodium: 136 mEq/L (ref 135–145)

## 2013-04-17 LAB — CBC
Hemoglobin: 8.4 g/dL — ABNORMAL LOW (ref 13.0–17.0)
MCH: 31.8 pg (ref 26.0–34.0)
RBC: 2.64 MIL/uL — ABNORMAL LOW (ref 4.22–5.81)
WBC: 7.7 10*3/uL (ref 4.0–10.5)

## 2013-04-17 MED ORDER — BISACODYL 5 MG PO TBEC
5.0000 mg | DELAYED_RELEASE_TABLET | Freq: Once | ORAL | Status: AC
  Administered 2013-04-17: 5 mg via ORAL
  Filled 2013-04-17: qty 1

## 2013-04-17 MED ORDER — SODIUM BICARBONATE/SODIUM CHLORIDE MOUTHWASH
Freq: Four times a day (QID) | OROMUCOSAL | Status: DC
  Administered 2013-04-17 – 2013-04-18 (×6): via OROMUCOSAL
  Administered 2013-04-18: 1 via OROMUCOSAL
  Administered 2013-04-18: 10:00:00 via OROMUCOSAL
  Administered 2013-04-19: 1 via OROMUCOSAL
  Administered 2013-04-19 – 2013-04-22 (×11): via OROMUCOSAL
  Filled 2013-04-17: qty 1000

## 2013-04-17 MED ORDER — SODIUM BICARBONATE/SODIUM CHLORIDE MOUTHWASH
Freq: Four times a day (QID) | OROMUCOSAL | Status: DC
  Filled 2013-04-17: qty 1000

## 2013-04-17 MED ORDER — BIOTENE DRY MOUTH MT LIQD
15.0000 mL | Freq: Four times a day (QID) | OROMUCOSAL | Status: DC
  Administered 2013-04-17 – 2013-04-22 (×20): 15 mL via OROMUCOSAL

## 2013-04-17 NOTE — Progress Notes (Signed)
Mark [name] is doing better this morning. Very little in the way of nausea. His calcium was down to 9.6.  His chemotherapy started yesterday. He is doing well with this. He is ambulating. His appetite is a little better. He's had no diarrhea. He's not complaining of any bony pain.  Vital signs stable. Temperature 98.1 blood pressure 125/70. Pulse is 83. Oral exam shows no mucositis. I'll start him on some oral mouth rinse. Lungs are clear. Cardiac exam regular rate and rhythm with normal S1-S2. There are no murmurs. Abdomen is soft. Has good bowel sounds. Is no palpable hepato- splenomegaly. Extremities shows good muscle strength in his legs. He has good range of motion of joints. The swelling of the legs.  His hemoglobin is 8.4. We will need to watch this. It is possible we may have to transfuse him as I suspect that the chemotherapy will definitely lower his blood counts more.  We will continue him on chemotherapy for his resistant myeloma. His 24 hour urine is completed. We will see what the immunofixation shows.  Appreciative the great care that he is getting by the staff on 3E!!!!  Pete E

## 2013-04-18 ENCOUNTER — Ambulatory Visit: Payer: BC Managed Care – PPO

## 2013-04-18 LAB — CBC
Hemoglobin: 8.8 g/dL — ABNORMAL LOW (ref 13.0–17.0)
MCH: 32.5 pg (ref 26.0–34.0)
RBC: 2.71 MIL/uL — ABNORMAL LOW (ref 4.22–5.81)
WBC: 10.9 10*3/uL — ABNORMAL HIGH (ref 4.0–10.5)

## 2013-04-18 LAB — BASIC METABOLIC PANEL
CO2: 22 mEq/L (ref 19–32)
Chloride: 103 mEq/L (ref 96–112)
Glucose, Bld: 213 mg/dL — ABNORMAL HIGH (ref 70–99)
Potassium: 4 mEq/L (ref 3.5–5.1)
Sodium: 133 mEq/L — ABNORMAL LOW (ref 135–145)

## 2013-04-18 LAB — UIFE/LIGHT CHAINS/TP QN, 24-HR UR
Beta, Urine: DETECTED — AB
Free Kappa Lt Chains,Ur: 196 mg/dL — ABNORMAL HIGH (ref 0.14–2.42)
Free Lambda Lt Chains,Ur: 0.17 mg/dL (ref 0.02–0.67)
Free Lt Chn Excr Rate: 8869 mg/d
Gamma Globulin, Urine: DETECTED — AB
Time: 24 hours

## 2013-04-18 MED ORDER — POTASSIUM CHLORIDE 2 MEQ/ML IV SOLN
INTRAVENOUS | Status: DC
  Administered 2013-04-18 – 2013-04-20 (×6): via INTRAVENOUS
  Filled 2013-04-18 (×11): qty 10

## 2013-04-18 NOTE — Progress Notes (Signed)
Chemotherapy is going well. He's had no nausea vomiting. His calcium is now 8.6. He is out of bed. His appetite is pinked up. He did have a bowel movement yesterday.  His 24-hour urine shows a marked increase in his kappa light chain excretion. He is now up to the 9000 milligrams a day. Again, this is a clear indicator for his myeloma not responding to front-line therapy.  He's had no fever sweats or chills. He's had some pain but this is well-controlled. We will try to increase his fentanyl patch by 1 "unit". I will put him up  to 37.5 mcg every 3 day.  He's had no cough. No shortness of breath. He's had no mouth sores. He's not noticed any fluttering in his heart. There is no leg swelling. Is getting his Lovenox shots.   Vital signs show temperature 97.8 pulse 78. Respiratory rate is 16. Blood pressure 127/80. Oral exam shows no mucositis. Lungs are clear. Cardiac exam regular rate and rhythm. Abdomen soft. Extremities shows good strength. No weakness is noted. No edema is noted. Skin exam no rashes. No petechia noted. Neurological exam shows no focal neurological deficits.  His labs show his kappa light chain to be 8870mg  per day. White cell count 10.9 hemoglobin 8.8 platelets count 158. Calcium 8.6. Sodium 133. BUN 13 creatinine 0.8.  We will continue his chemotherapy. Is going well right now. So far no complications.  We clearly will need to do a 24-hour urines on him as this will be a great indicator for response.  Is getting great care up on 3 E.!!!  Pete E.

## 2013-04-18 NOTE — Progress Notes (Signed)
  Radiation Oncology         [phone] ________________________________  Name: Mark [name] MRN: [id number]  Date: 04/15/2013  DOB: 1970-10-21  Simulation Verification Note  Status: inpatient  NARRATIVE: The patient was brought to the treatment unit and placed in the planned treatment position. The clinical setup was verified. Then port films were obtained and uploaded to the radiation oncology medical record software.  The treatment beams were carefully compared against the planned radiation fields. The position location and shape of the radiation fields was reviewed. The targeted volume of tissue appears appropriately covered by the radiation beams. Organs at risk appear to be excluded as planned.  Based on my personal review, I approved the simulation verification. The patient's treatment will proceed as planned.  ------------------------------------------------  Lurline Hare, MD

## 2013-04-19 ENCOUNTER — Other Ambulatory Visit: Payer: Self-pay | Admitting: Lab

## 2013-04-19 ENCOUNTER — Ambulatory Visit: Payer: Self-pay

## 2013-04-19 ENCOUNTER — Ambulatory Visit: Payer: Self-pay | Admitting: Hematology & Oncology

## 2013-04-19 ENCOUNTER — Ambulatory Visit: Payer: BC Managed Care – PPO

## 2013-04-19 LAB — BASIC METABOLIC PANEL
CO2: 22 mEq/L (ref 19–32)
Calcium: 8.4 mg/dL (ref 8.4–10.5)
Chloride: 104 mEq/L (ref 96–112)
Creatinine, Ser: 0.79 mg/dL (ref 0.50–1.35)
Glucose, Bld: 169 mg/dL — ABNORMAL HIGH (ref 70–99)

## 2013-04-19 LAB — CBC
HCT: 27.5 % — ABNORMAL LOW (ref 39.0–52.0)
Hemoglobin: 9.8 g/dL — ABNORMAL LOW (ref 13.0–17.0)
MCH: 32.3 pg (ref 26.0–34.0)
MCV: 90.8 fL (ref 78.0–100.0)
RBC: 3.03 MIL/uL — ABNORMAL LOW (ref 4.22–5.81)

## 2013-04-19 MED ORDER — BISACODYL 5 MG PO TBEC: 5.0000 mg | DELAYED_RELEASE_TABLET | Freq: Every day | ORAL | Status: DC | PRN

## 2013-04-19 NOTE — Plan of Care (Signed)
Problem: Phase I Progression Outcomes Goal: OOB as tolerated unless otherwise ordered Outcome: Completed/Met Date Met:  04/19/13 04/18/13-1940 patient ambulating in hallway

## 2013-04-19 NOTE — Progress Notes (Signed)
Mr. Ahr continues to do well. His chemotherapy is going without difficulties. He gets his last bag of infusional chemotherapy today. He's had a little nausea but not bad. He is eating better. He's out of bed. He's had no cough. He's had no diarrhea or constipation. He's had no leg swelling. His pain seems to be under very good control.  On physical exam, temperature 97.6 pulse 63 with heart rate 60 blood pressure 117/76. Oral exam shows no mucositis. Neck is supple with no adenopathy. Lungs are clear. Cardiac exam regular rate and rhythm. Abdomen soft. Has good bowel sounds. Extremities shows good strength. There is no swelling. Skin shows no rashes. Neurological exam no focal neurological deficits.  Labs show his calcium to be 8.4. Creatinine 0.9. Glucose 169. Hemoglobin 9.8. White cell count 10.7. Platelets 216.  He has Kappa light chain myeloma. This, I would say, is refractory. He's on second line therapy with VD-PACE.  We will finish up his chemotherapy tomorrow. We will then be watching him through the weekend making sure that there is no problems with his labs or nausea.  The staff on 3 E. and then a great job with him with his chemotherapy!!!  Pete E.

## 2013-04-20 LAB — CBC
HCT: 26.8 % — ABNORMAL LOW (ref 39.0–52.0)
MCH: 32.2 pg (ref 26.0–34.0)
MCV: 90.8 fL (ref 78.0–100.0)
Platelets: 183 10*3/uL (ref 150–400)
RDW: 13.9 % (ref 11.5–15.5)

## 2013-04-20 LAB — GLUCOSE, CAPILLARY
Glucose-Capillary: 234 mg/dL — ABNORMAL HIGH (ref 70–99)
Glucose-Capillary: 169 mg/dL — ABNORMAL HIGH (ref 70–99)
Glucose-Capillary: 155 mg/dL — ABNORMAL HIGH (ref 70–99)

## 2013-04-20 LAB — BASIC METABOLIC PANEL
BUN: 19 mg/dL (ref 6–23)
CO2: 22 mEq/L (ref 19–32)
Calcium: 7.9 mg/dL — ABNORMAL LOW (ref 8.4–10.5)
Creatinine, Ser: 0.94 mg/dL (ref 0.50–1.35)
GFR calc Af Amer: >90 mL/min (ref >90)

## 2013-04-20 MED ORDER — APREPITANT 80 & 125 MG PO MISC
125.0000 mg | Freq: Once | ORAL | Status: DC
  Filled 2013-04-20: qty 3

## 2013-04-20 MED ORDER — SODIUM CHLORIDE 0.9 % IV SOLN
INTRAVENOUS | Status: DC
  Administered 2013-04-20: 11:00:00 via INTRAVENOUS

## 2013-04-20 MED ORDER — INSULIN ASPART 100 UNIT/ML ~~LOC~~ SOLN
10.0000 [IU] | Freq: Once | SUBCUTANEOUS | Status: AC
  Administered 2013-04-20: 10 [IU] via SUBCUTANEOUS

## 2013-04-20 MED ORDER — ONDANSETRON 8 MG/NS 50 ML IVPB
8.0000 mg | Freq: Three times a day (TID) | INTRAVENOUS | Status: DC
  Administered 2013-04-20 – 2013-04-22 (×5): 8 mg via INTRAVENOUS
  Filled 2013-04-20 (×9): qty 8

## 2013-04-20 MED ORDER — LEVOFLOXACIN 500 MG PO TABS
500.0000 mg | ORAL_TABLET | Freq: Every day | ORAL | Status: DC
  Administered 2013-04-20 – 2013-04-22 (×3): 500 mg via ORAL
  Filled 2013-04-20 (×3): qty 1

## 2013-04-20 MED ORDER — APREPITANT 80 & 125 MG PO TRIPAK DAY 2 & 3
80.0000 mg | Freq: Every day | ORAL | Status: DC
  Filled 2013-04-20: qty 2

## 2013-04-20 MED ORDER — INSULIN ASPART 100 UNIT/ML ~~LOC~~ SOLN: 0.0000 [IU] | Freq: Every day | SUBCUTANEOUS | Status: DC

## 2013-04-20 MED ORDER — INSULIN ASPART 100 UNIT/ML ~~LOC~~ SOLN: 0.0000 [IU] | Freq: Three times a day (TID) | SUBCUTANEOUS | Status: DC

## 2013-04-20 NOTE — Progress Notes (Signed)
Mr. Tantillo is having a little more nausea. There is no vomiting. His chemotherapy finishes today. I'll make sure that we are aggressive with his anti-emetics.  His chemotherapy, overall, went well. He really had no other side effects. He's having bowel movements. He is urinating well. He is out of bed. His appetite has been okay. There is no cough or shortness of breath. He's had no pain.  On physical exam, his temperature is 98.3. Pulse 88. Blood pressure 126/86. Oral exam shows no mucositis. There is no adenopathy. Lungs are clear. Cardiac exam regular rate and rhythm with no murmurs rubs or bruits. Abdomen is soft. He has good bowel sounds. There is no guarding or rebound tenderness. Extremities shows no clubbing cyanosis or edema. Has good strength in his legs. Skin exam no rashes. Neurological exam no focal neurological deficits.  Laboratory studies shows calcium of 7.9. Creatinine 0.94. Glucose is 287. Hemoglobin 9.5. White cell count 6.7.  We need to control his glucoses. I will change his IV fluid to normal saline. We'll get him on a sliding scale regular insulin.  I will start him on some oral Levaquin. We'll make sure that he also is on an anti-emetic program on schedule.  I expect that he will be him through the weekend. If all looks okay, then he should be able to go home on Monday.  As always, he's gotten fantastic care on 3 E!!   Pete E.

## 2013-04-20 NOTE — Plan of Care (Signed)
Problem: Phase II Progression Outcomes Goal: Nausea and vomiting controlled Outcome: Progressing Still with mild nausea after prn zofran and phenergan

## 2013-04-20 NOTE — Progress Notes (Signed)
Patient refused pm medications including lovenox. Stating he "did not feel well". C/o nausea. Given prn.

## 2013-04-21 LAB — GLUCOSE, CAPILLARY
Glucose-Capillary: 98 mg/dL (ref 70–99)
Glucose-Capillary: 120 mg/dL — ABNORMAL HIGH (ref 70–99)

## 2013-04-21 LAB — CBC
HCT: 26.6 % — ABNORMAL LOW (ref 39.0–52.0)
Hemoglobin: 9.6 g/dL — ABNORMAL LOW (ref 13.0–17.0)
MCV: 89.9 fL (ref 78.0–100.0)
RDW: 13.8 % (ref 11.5–15.5)
WBC: 2.7 10*3/uL — ABNORMAL LOW (ref 4.0–10.5)

## 2013-04-21 LAB — BASIC METABOLIC PANEL
BUN: 15 mg/dL (ref 6–23)
Chloride: 103 mEq/L (ref 96–112)
Creatinine, Ser: 0.88 mg/dL (ref 0.50–1.35)
GFR calc Af Amer: >90 mL/min (ref >90)
Glucose, Bld: 105 mg/dL — ABNORMAL HIGH (ref 70–99)
Potassium: 3.9 mEq/L (ref 3.5–5.1)

## 2013-04-21 MED ORDER — HEPARIN SOD (PORK) LOCK FLUSH 100 UNIT/ML IV SOLN
500.0000 [IU] | Freq: Once | INTRAVENOUS | Status: AC
  Administered 2013-04-21: 500 [IU] via INTRAVENOUS
  Filled 2013-04-21: qty 5

## 2013-04-21 NOTE — Progress Notes (Addendum)
Paged on call Oncologist re: pt portacath needle due to be changed today and pt most likely going home tomorrow.  We do have PICC line access.  Order obtained to deaccess PAC and run IVF through PICC line.

## 2013-04-22 ENCOUNTER — Other Ambulatory Visit: Payer: Self-pay | Admitting: Hematology & Oncology

## 2013-04-22 ENCOUNTER — Telehealth: Payer: Self-pay | Admitting: Hematology & Oncology

## 2013-04-22 ENCOUNTER — Ambulatory Visit: Payer: BC Managed Care – PPO

## 2013-04-22 DIAGNOSIS — C9000 Multiple myeloma not having achieved remission: Secondary | ICD-10-CM

## 2013-04-22 LAB — CBC
HCT: 24.5 % — ABNORMAL LOW (ref 39.0–52.0)
Hemoglobin: 8.8 g/dL — ABNORMAL LOW (ref 13.0–17.0)
MCV: 89.1 fL (ref 78.0–100.0)
RBC: 2.75 MIL/uL — ABNORMAL LOW (ref 4.22–5.81)
RDW: 14 % (ref 11.5–15.5)
WBC: 1.6 10*3/uL — ABNORMAL LOW (ref 4.0–10.5)

## 2013-04-22 LAB — GLUCOSE, CAPILLARY

## 2013-04-22 LAB — BASIC METABOLIC PANEL
BUN: 14 mg/dL (ref 6–23)
CO2: 26 mEq/L (ref 19–32)
Chloride: 103 mEq/L (ref 96–112)
Creatinine, Ser: 0.82 mg/dL (ref 0.50–1.35)
GFR calc Af Amer: >90 mL/min (ref >90)
Potassium: 3.4 mEq/L — ABNORMAL LOW (ref 3.5–5.1)

## 2013-04-22 MED ORDER — BIOTENE DRY MOUTH MT LIQD: 15.0000 mL | Freq: Four times a day (QID) | OROMUCOSAL | Status: DC

## 2013-04-22 MED ORDER — LORAZEPAM 0.5 MG PO TABS: 0.5000 mg | ORAL_TABLET | Freq: Four times a day (QID) | ORAL | Status: DC | PRN

## 2013-04-22 MED ORDER — FILGRASTIM 480 MCG/1.6ML IJ SOLN
480.0000 ug | Freq: Once | INTRAMUSCULAR | Status: AC
  Administered 2013-04-22: 480 ug via SUBCUTANEOUS
  Filled 2013-04-22: qty 1.6

## 2013-04-22 MED ORDER — FLUCONAZOLE 100 MG PO TABS: 100.0000 mg | ORAL_TABLET | Freq: Every day | ORAL | Status: DC

## 2013-04-22 MED ORDER — LEVOFLOXACIN 500 MG PO TABS: 500.0000 mg | ORAL_TABLET | Freq: Every day | ORAL | Status: DC

## 2013-04-22 MED ORDER — PROCHLORPERAZINE MALEATE 10 MG PO TABS: 10.0000 mg | ORAL_TABLET | Freq: Four times a day (QID) | ORAL | Status: DC | PRN

## 2013-04-22 MED ORDER — SODIUM BICARBONATE/SODIUM CHLORIDE MOUTHWASH: 4.0000 "application " | Freq: Four times a day (QID) | OROMUCOSAL | Status: DC

## 2013-04-22 NOTE — Telephone Encounter (Signed)
Per MD orders to sch lab and inj for 04/23/13 and 2weeks later lab/md apt.  Apts were sch for 04/23/13 and 05/06/13.  I called to give patient apts, but there wasn't an answer.  i left message on patient's voice mail of the apts dates/times and to call us back to confirm message was received

## 2013-04-22 NOTE — Discharge Instructions (Signed)
Call if you have severe nausea vomiting, bleeding, diarrhea, shortness of breath, or temperature greater than 101.  Come to the office on Tuesday for your white cell booster shot.  Please make sure he takes all 3 antibiotics: Levaquin, Diflucan, and Famvir to help prevent infections.  Please drink a lot of fluids so that your kidneys stay flushed.

## 2013-04-22 NOTE — Discharge Summary (Signed)
#   [id number] is d/c summary.  Mark E.

## 2013-04-22 NOTE — Progress Notes (Signed)
Pt did have 1 episode of vomiting overnight.  (He did refuse his Zofran dose scheduled for 2200.) PRN Phenergan given at time of emesis with good results per pt.  Will continue to monitor.  *Of note, pt refused his Emend on Saturday and Sunday.  Will see if Dr. Myna Hidalgo wants him to start the tri-pack today, or discontinue.

## 2013-04-22 NOTE — Discharge Summary (Signed)
Dictated.  Mark [name]

## 2013-04-23 ENCOUNTER — Ambulatory Visit (HOSPITAL_BASED_OUTPATIENT_CLINIC_OR_DEPARTMENT_OTHER): Payer: BC Managed Care – PPO

## 2013-04-23 ENCOUNTER — Ambulatory Visit: Payer: BC Managed Care – PPO

## 2013-04-23 ENCOUNTER — Encounter: Payer: Self-pay | Admitting: Radiation Oncology

## 2013-04-23 ENCOUNTER — Other Ambulatory Visit (HOSPITAL_BASED_OUTPATIENT_CLINIC_OR_DEPARTMENT_OTHER): Payer: BC Managed Care – PPO | Admitting: Lab

## 2013-04-23 VITALS — BP 110/78 | HR 96 | Temp 97.7°F | Resp 16

## 2013-04-23 DIAGNOSIS — Z5189 Encounter for other specified aftercare: Secondary | ICD-10-CM

## 2013-04-23 DIAGNOSIS — C9000 Multiple myeloma not having achieved remission: Secondary | ICD-10-CM

## 2013-04-23 LAB — CMP (CANCER CENTER ONLY)
ALT(SGPT): 16 U/L (ref 10–47)
AST: 15 U/L (ref 11–38)
Albumin: 3.4 g/dL (ref 3.3–5.5)
BUN, Bld: 11 mg/dL (ref 7–22)
Calcium: 7.2 mg/dL — ABNORMAL LOW (ref 8.0–10.3)
Chloride: 105 mEq/L (ref 98–108)
Glucose, Bld: 107 mg/dL (ref 73–118)
Potassium: 3.9 mEq/L (ref 3.3–4.7)
Sodium: 142 mEq/L (ref 128–145)
Total Bilirubin: 0.9 mg/dl (ref 0.20–1.60)
Total Protein: 6 g/dL — ABNORMAL LOW (ref 6.4–8.1)

## 2013-04-23 LAB — CBC WITH DIFFERENTIAL (CANCER CENTER ONLY)
BASO#: 0 10*3/uL (ref 0.0–0.2)
EOS%: 1 % (ref 0.0–7.0)
Eosinophils Absolute: 0.1 10*3/uL (ref 0.0–0.5)
HCT: 25.8 % — ABNORMAL LOW (ref 38.7–49.9)
HGB: 9.1 g/dL — ABNORMAL LOW (ref 13.0–17.1)
LYMPH#: 0.1 10*3/uL — ABNORMAL LOW (ref 0.9–3.3)
MCH: 32.9 pg (ref 28.0–33.4)
NEUT#: 7.9 10*3/uL — ABNORMAL HIGH (ref 1.5–6.5)
RBC: 2.77 10*6/uL — ABNORMAL LOW (ref 4.20–5.70)
RDW: 13.2 % (ref 11.1–15.7)
WBC: 8.1 10*3/uL (ref 4.0–10.0)

## 2013-04-23 MED ORDER — PEGFILGRASTIM INJECTION 6 MG/0.6ML
6.0000 mg | Freq: Once | SUBCUTANEOUS | Status: AC
  Administered 2013-04-23: 6 mg via SUBCUTANEOUS

## 2013-04-23 MED ORDER — PEGFILGRASTIM INJECTION 6 MG/0.6ML
SUBCUTANEOUS | Status: AC
  Filled 2013-04-23: qty 0.6

## 2013-04-23 NOTE — Discharge Summary (Signed)
Mark [name], Mark [name]                 ACCOUNT NO.:  [id number]  MEDICAL RECORD NO.:  [id number]  LOCATION:  1305                         FACILITY:  [facility]  PHYSICIAN:  [name], M.D.  DATE OF BIRTH:  Dec 22, 1970  DATE OF ADMISSION:  04/14/2013 DATE OF DISCHARGE:  04/22/2013                              DISCHARGE SUMMARY   DISCHARGE DIAGNOSES: 1. Refractory kappa light chain myeloma. 2. Initiation of chemotherapy with VDT-PACE. 3. Hypercalcemia. 4. Placement of peripherally inserted central catheter line. 5. Anemia secondary to chemotherapy.  CONDITION ON DISCHARGE:  Stable.  ACTIVITIES:  As tolerated.  DIET:  Without restrictions.  The patient will take a lot of fluids.  MEDICATION ON DISCHARGE: 1. Diflucan 100 mg p.o. daily. 2. Levaquin 500 mg p.o. daily. 3. Famvir 250 mg p.o. daily. 4. Pepcid 20 mg p.o. b.i.d. 5. Ativan 0.5 mg sublingual every 6 hours p.r.n. 6. Fentanyl patch 25 mcg q.3 days to the skin. 7. Lactulose elixir 30 mL p.o. t.i.d. 8. Oxycodone 5-10 mg p.o. every 6 hours p.r.n. 9. Compazine 10 mg p.o. every 6 hours p.r.n. nausea and vomiting.  HOSPITAL COURSE:  Mark [name] was admitted by the hospitalist on the 14th. He came in because of hypercalcemia.  It was clear that his myeloma was progressing.  He had previously been on treatment with Cytoxan/Velcade/Decadron.  We basically repeated his myeloma studies.  His 24-hour urine showed 8,900 mg of kappa light chain.  He had a CT scan of the chest.  This showed innumerable lytic lesions. No change was noted from MRI done 1 week prior.  There is compression fracture at T12.  This was found to be a myelomatous lesion.  Of note, there was fracture at T9.  We got a bone survey on him.  The bone survey showed some new lytic lesions throughout the skeleton.  We subsequently got his calcium down.  He got IV fluids.  He got Aredia. We give calcitonin.  His calcium came down really nicely.  On the day  of discharge, his calcium was 7.0.  I felt that we had to change therapy on him.  We need some of the more aggressive.  As such, we tried him on a leukemia regimen with VDT-PACE.  We started this on the April 17, 2013.  His body surface area was 2.11 m2.  He got the chemotherapy without any difficulties.  He got cisplatinum at 21 mg via continuous infusion over 4 days.  He got Cytoxan at 840 mg via continuous fusion daily for 4 days.  He also got Adriamycin 22 mg a day for 4 days.  He also got etoposide 84 mg per day for 4 days, continuous infusion.  He tolerated this well.  He got Decadron p.o.  While he was hospitalized, he got Lovenox for prophylaxis.  We are aggressive with his antiemetics.  I did give him Zofran afterwards.  He also got Aloxi.  We gave him Emend.  Again, he tolerated his chemotherapy fairly well.  He did have a little nausea and vomiting on the April 21, 2013.  On the April 22, 2013, his lab work showed a white count 1.6, hemoglobin 8.8,  hematocrit 24.5, platelet count 105.  His blood sugars improved.  We changed his IV fluids from D5 normal saline just to normal saline.  His potassium was 3.4 upon discharge.  Sodium was 136.  BUN 14, creatinine 0.8.  Calcium was 7.0.  PHYSICAL EXAMINATION:  VITAL SIGNS:  Upon discharge, his vital signs showed temperature 98.7, pulse 92, respiratory rate 16, blood pressure 108/70. HEENT:  Head exam showed no ocular or oral lesions.  There are no palpable cervical or supraclavicular lymph nodes. LUNGS:  Clear bilaterally. CARDIAC:  Regular rate and rhythm with normal S1, S2.  There are no murmurs, rubs, or bruits. ABDOMEN:  Soft.  He has good bowel sounds.  There is no palpable abdominal mass.  There is no palpable hepatosplenomegaly. EXTREMITIES:  Show no clubbing, cyanosis, or edema. NEUROLOGICAL:  Shows no focal neurological deficits. SKIN:  No rashes, ecchymosis, or petechia.     [name],  M.D.     PRE/MEDQ  D:  04/22/2013  T:  04/22/2013  Job:  [id number]

## 2013-04-23 NOTE — Progress Notes (Signed)
  Radiation Oncology         ([phone]) [phone] ________________________________  Name: Mark [name] MRN: [id number]  Date: 04/23/2013  DOB: 12-Aug-1970  End of Treatment Note  Diagnosis:   Multiple myeloma     Indication for treatment:  Pain and compression fractures and significant involvement of the  T12 vertebral body       Radiation treatment dates:   The patient was simulated for treatment however his situation worsened requiring inpatient systemic chemotherapy. Radiation therapy was therefore not delivered  Site/dose:  n/a  Beams/energy:     Narrative:   Plan: When necessary followup in radiation oncology. If the area at T12 causes more symptoms than he could at a later date proceed with radiation treatment to this area  -----------------------------------  Billie Lade, PhD, MD

## 2013-04-24 ENCOUNTER — Ambulatory Visit: Payer: BC Managed Care – PPO

## 2013-04-25 ENCOUNTER — Ambulatory Visit: Payer: BC Managed Care – PPO

## 2013-04-26 ENCOUNTER — Ambulatory Visit: Payer: BC Managed Care – PPO

## 2013-04-29 ENCOUNTER — Ambulatory Visit: Payer: BC Managed Care – PPO

## 2013-04-30 ENCOUNTER — Ambulatory Visit: Payer: BC Managed Care – PPO

## 2013-05-01 ENCOUNTER — Ambulatory Visit: Payer: BC Managed Care – PPO

## 2013-05-02 ENCOUNTER — Ambulatory Visit: Payer: BC Managed Care – PPO

## 2013-05-03 ENCOUNTER — Ambulatory Visit: Payer: BC Managed Care – PPO

## 2013-05-06 ENCOUNTER — Ambulatory Visit: Payer: BC Managed Care – PPO

## 2013-05-06 ENCOUNTER — Ambulatory Visit (HOSPITAL_BASED_OUTPATIENT_CLINIC_OR_DEPARTMENT_OTHER): Payer: BC Managed Care – PPO | Admitting: Lab

## 2013-05-06 ENCOUNTER — Ambulatory Visit (HOSPITAL_BASED_OUTPATIENT_CLINIC_OR_DEPARTMENT_OTHER): Payer: BC Managed Care – PPO | Admitting: Hematology & Oncology

## 2013-05-06 VITALS — BP 138/79 | HR 91 | Temp 98.5°F | Resp 18 | Ht 71.0 in | Wt 210.0 lb

## 2013-05-06 DIAGNOSIS — C9000 Multiple myeloma not having achieved remission: Secondary | ICD-10-CM

## 2013-05-06 DIAGNOSIS — D6481 Anemia due to antineoplastic chemotherapy: Secondary | ICD-10-CM

## 2013-05-06 LAB — CBC WITH DIFFERENTIAL (CANCER CENTER ONLY)
BASO#: 0 10*3/uL (ref 0.0–0.2)
BASO%: 0.2 % (ref 0.0–2.0)
EOS%: 0.4 % (ref 0.0–7.0)
HGB: 7.8 g/dL — ABNORMAL LOW (ref 13.0–17.1)
LYMPH#: 0.7 10*3/uL — ABNORMAL LOW (ref 0.9–3.3)
MCHC: 32.6 g/dL (ref 32.0–35.9)
MONO#: 1.1 10*3/uL — ABNORMAL HIGH (ref 0.1–0.9)
MONO%: 23.5 % — ABNORMAL HIGH (ref 0.0–13.0)
NEUT#: 2.7 10*3/uL (ref 1.5–6.5)
Platelets: 161 10*3/uL (ref 145–400)

## 2013-05-06 LAB — CMP (CANCER CENTER ONLY)
Alkaline Phosphatase: 226 U/L — ABNORMAL HIGH (ref 26–84)
BUN, Bld: 10 mg/dL (ref 7–22)
CO2: 29 mEq/L (ref 18–33)
Calcium: 6.5 mg/dL — ABNORMAL LOW (ref 8.0–10.3)
Creat: 1.3 mg/dl — ABNORMAL HIGH (ref 0.6–1.2)
Glucose, Bld: 91 mg/dL (ref 73–118)
Total Bilirubin: 0.8 mg/dl (ref 0.20–1.60)

## 2013-05-06 NOTE — Progress Notes (Signed)
This office note has been dictated.

## 2013-05-07 ENCOUNTER — Other Ambulatory Visit: Payer: Self-pay | Admitting: Hematology & Oncology

## 2013-05-07 ENCOUNTER — Ambulatory Visit: Payer: BC Managed Care – PPO

## 2013-05-07 ENCOUNTER — Telehealth: Payer: Self-pay | Admitting: *Deleted

## 2013-05-07 DIAGNOSIS — C9000 Multiple myeloma not having achieved remission: Secondary | ICD-10-CM

## 2013-05-07 NOTE — Progress Notes (Signed)
DIAGNOSES: 1. Kappa light chain myeloma -- refractory. 2. Recurrent hypercalcemia. 3. Anemia secondary to chemotherapy.  CURRENT THERAPY:  Patient is status post cycle 1 of salvage therapy with VD-PACE.  INTERIM HISTORY:  Mark [name] comes in for followup.  This is probably the best I have seen him look.  Hopefully, this is a sign that chemotherapy is working.  We got him in the hospital back in September.  We started him on VD-PACE chemo.  He started on the 17th.  He actually tolerated this pretty well.  Typically, for Mark [name], he starts having problems when his calcium goes back up.  When we last checked his lab work on September 23rd, his calcium was down to 7.2.  He has had no problems with fatigue or weakness.  He has had no nausea or vomiting.  He has had no fevers, sweats, or chills.  He has had no bruises.  He has had no headache.  There has been no bleeding or bruising.  Overall, his performance status is ECOG 1.  PHYSICAL EXAMINATION:  General:  This is a well-developed, well- nourished African American gentleman in no obvious distress.  Vital signs:  Temperature of 98.5, pulse 91, respiratory rate 18, blood pressure 138/79.  Weight is 210 pounds.  Head and neck:  Normocephalic, atraumatic skull.  He has no ocular or oral lesions.  There are no palpable cervical or supraclavicular lymph nodes.  Lungs:  Clear bilaterally.  Cardiac:  Regular rate and rhythm with a normal S1 and S2. There are no murmurs, rubs, or bruits.  Abdomen:  Soft.  He has good bowel sounds.  There is no fluid wave.  There is no palpable abdominal mass.  No palpable hepatosplenomegaly.  Back:  No tenderness over the spine, ribs, or hips.  Extremities:  No clubbing, cyanosis, or edema. Neurological:  No focal neurological deficits.  LABORATORY STUDIES:  White cell count is 4.6, hemoglobin 7.8, hematocrit 23.9, platelet count 161.  Electrolytes are pending.  IMPRESSION:  Mark [name] is a nice  42 year old African American gentleman with kappa light chain myeloma.  I would have to say that he is resilient at best.  He might be considered refractory.  Hopefully, we do have him on the right regimen.  Our goal is to try to get him into some kind of remission so that he can have a stem cell transplant.  His light chain studies will be the key for [place].  He will be admitted sometime this week.  I want to see what his light chain levels are.  If we find that they are down nicely, then we will get him into the hospital.  When he is in the hospital, I will probably do an MRI or CT scan.  We will probably get a 24-hour urine on him when he is in the hospital also.  He will need to have another central line put in for chemotherapy when in the hospital.  We will call Mr. Portela regarding his admission date.  I will plan to get him back to see [place] probably in about 3 weeks or so.  Again, if we find that he is responding well, then we will see about getting him out to [facility] for consideration of a stem cell transplant.    ______________________________ [name], M.D. PRE/MEDQ  D:  05/06/2013  T:  05/07/2013  Job:  [id number]

## 2013-05-07 NOTE — Telephone Encounter (Signed)
Spoke to Mark [name] in bed placement for [facility]. To be admitted tomorrow for chemotherapy. Pt was made aware and knows they will be calling him with a time to come in. If he doesn't hear anything by 12N tomorrow, he will call the office.

## 2013-05-08 ENCOUNTER — Inpatient Hospital Stay (HOSPITAL_COMMUNITY): Payer: BC Managed Care – PPO

## 2013-05-08 ENCOUNTER — Inpatient Hospital Stay (HOSPITAL_COMMUNITY)
Admission: AD | Admit: 2013-05-08 | Discharge: 2013-05-13 | DRG: 410 | Disposition: A | Discharge status: Home / Self Care | Payer: BC Managed Care – PPO | Source: Ambulatory Visit | Attending: Hematology & Oncology | Admitting: Hematology & Oncology

## 2013-05-08 ENCOUNTER — Encounter (HOSPITAL_COMMUNITY): Payer: Self-pay

## 2013-05-08 DIAGNOSIS — C9000 Multiple myeloma not having achieved remission: Secondary | ICD-10-CM | POA: Diagnosis present

## 2013-05-08 DIAGNOSIS — Z5111 Encounter for antineoplastic chemotherapy: Principal | ICD-10-CM

## 2013-05-08 DIAGNOSIS — T451X5A Adverse effect of antineoplastic and immunosuppressive drugs, initial encounter: Secondary | ICD-10-CM | POA: Diagnosis not present

## 2013-05-08 DIAGNOSIS — D6481 Anemia due to antineoplastic chemotherapy: Secondary | ICD-10-CM | POA: Diagnosis not present

## 2013-05-08 DIAGNOSIS — K5900 Constipation, unspecified: Secondary | ICD-10-CM | POA: Diagnosis not present

## 2013-05-08 LAB — IFE INTERPRETATION

## 2013-05-08 LAB — KAPPA/LAMBDA LIGHT CHAINS
Kappa free light chain: 10.7 mg/dL — ABNORMAL HIGH (ref 0.33–1.94)
Kappa:Lambda Ratio: 10.19 — ABNORMAL HIGH (ref 0.26–1.65)
Lambda Free Lght Chn: 1.05 mg/dL (ref 0.57–2.63)

## 2013-05-08 LAB — CBC WITH DIFFERENTIAL/PLATELET
Eosinophils Absolute: 0 10*3/uL (ref 0.0–0.7)
Lymphs Abs: 0.7 10*3/uL (ref 0.7–4.0)
MCH: 32.4 pg (ref 26.0–34.0)
MCHC: 33.9 g/dL (ref 30.0–36.0)
MCV: 95.4 fL (ref 78.0–100.0)
Monocytes Absolute: 0.9 10*3/uL (ref 0.1–1.0)
Monocytes Relative: 22 % — ABNORMAL HIGH (ref 3–12)
Neutrophils Relative %: 60 % (ref 43–77)
Platelets: 194 10*3/uL (ref 150–400)
RDW: 15.8 % — ABNORMAL HIGH (ref 11.5–15.5)

## 2013-05-08 LAB — PROTEIN ELECTROPHORESIS, SERUM, WITH REFLEX
Alpha-1-Globulin: 8 % — ABNORMAL HIGH (ref 2.9–4.9)
Alpha-2-Globulin: 11.7 % (ref 7.1–11.8)
Beta 2: 4.1 % (ref 3.2–6.5)
Beta Globulin: 5.9 % (ref 4.7–7.2)
Gamma Globulin: 7.2 % — ABNORMAL LOW (ref 11.1–18.8)

## 2013-05-08 LAB — COMPREHENSIVE METABOLIC PANEL
ALT: 10 U/L (ref 0–53)
AST: 16 U/L (ref 0–37)
Albumin: 3.7 g/dL (ref 3.5–5.2)
Alkaline Phosphatase: 228 U/L — ABNORMAL HIGH (ref 39–117)
BUN: 8 mg/dL (ref 6–23)
Calcium: 6.5 mg/dL — ABNORMAL LOW (ref 8.4–10.5)
Chloride: 107 mEq/L (ref 96–112)
Creatinine, Ser: 1 mg/dL (ref 0.50–1.35)
GFR calc Af Amer: >90 mL/min (ref >90)
GFR calc non Af Amer: >90 mL/min (ref >90)
Glucose, Bld: 130 mg/dL — ABNORMAL HIGH (ref 70–99)
Potassium: 4 mEq/L (ref 3.5–5.1)
Total Bilirubin: 0.4 mg/dL (ref 0.3–1.2)

## 2013-05-08 LAB — IGG, IGA, IGM
IgG (Immunoglobin G), Serum: 463 mg/dL — ABNORMAL LOW (ref 650–1600)
IgM, Serum: 7 mg/dL — ABNORMAL LOW (ref 41–251)

## 2013-05-08 LAB — TSH: TSH: 2.375 u[IU]/mL (ref 0.350–4.500)

## 2013-05-08 MED ORDER — PALONOSETRON HCL INJECTION 0.25 MG/5ML
0.2500 mg | INTRAVENOUS | Status: AC
  Administered 2013-05-08 – 2013-05-10 (×2): 0.25 mg via INTRAVENOUS
  Filled 2013-05-08 (×2): qty 5

## 2013-05-08 MED ORDER — POTASSIUM CHLORIDE 2 MEQ/ML IV SOLN
INTRAVENOUS | Status: DC
  Administered 2013-05-08: 16:00:00 via INTRAVENOUS
  Filled 2013-05-08 (×5): qty 10

## 2013-05-08 MED ORDER — ENOXAPARIN SODIUM 40 MG/0.4ML ~~LOC~~ SOLN
40.0000 mg | SUBCUTANEOUS | Status: DC
  Administered 2013-05-08 – 2013-05-11 (×4): 40 mg via SUBCUTANEOUS
  Filled 2013-05-08 (×6): qty 0.4

## 2013-05-08 MED ORDER — SODIUM CHLORIDE 0.9 % IJ SOLN
10.0000 mL | INTRAMUSCULAR | Status: DC | PRN
  Administered 2013-05-08: 10 mL

## 2013-05-08 MED ORDER — SODIUM CHLORIDE 0.9 % IV SOLN
10.0000 mg/m2 | INTRAVENOUS | Status: AC
  Administered 2013-05-08 – 2013-05-11 (×4): 22 mg via INTRAVENOUS
  Filled 2013-05-08 (×6): qty 11

## 2013-05-08 MED ORDER — HEPARIN SOD (PORK) LOCK FLUSH 100 UNIT/ML IV SOLN: 500.0000 [IU] | Freq: Once | INTRAVENOUS | Status: AC | PRN

## 2013-05-08 MED ORDER — SODIUM CHLORIDE 0.9 % IV SOLN
INTRAVENOUS | Status: AC
  Administered 2013-05-08 – 2013-05-09 (×2): 21 mg via INTRAVENOUS
  Administered 2013-05-10: 945 mg via INTRAVENOUS
  Administered 2013-05-11: 21 mg via INTRAVENOUS
  Filled 2013-05-08 (×4): qty 21

## 2013-05-08 MED ORDER — OXYCODONE HCL 5 MG PO TABS
5.0000 mg | ORAL_TABLET | ORAL | Status: DC | PRN
  Administered 2013-05-08: 10 mg via ORAL
  Filled 2013-05-08: qty 2

## 2013-05-08 MED ORDER — BORTEZOMIB CHEMO IV INJECTION 3.5 MG
1.3000 mg/m2 | INTRAMUSCULAR | Status: DC
  Administered 2013-05-08: 2.7 mg via INTRAVENOUS
  Filled 2013-05-08 (×2): qty 2.7

## 2013-05-08 MED ORDER — ACYCLOVIR 200 MG PO CAPS
400.0000 mg | ORAL_CAPSULE | Freq: Two times a day (BID) | ORAL | Status: DC
  Administered 2013-05-08 – 2013-05-11 (×8): 400 mg via ORAL
  Filled 2013-05-08 (×11): qty 2

## 2013-05-08 MED ORDER — DEXAMETHASONE 6 MG PO TABS
40.0000 mg | ORAL_TABLET | ORAL | Status: AC
  Administered 2013-05-08 – 2013-05-11 (×4): 40 mg via ORAL
  Filled 2013-05-08 (×4): qty 1

## 2013-05-08 MED ORDER — HOT PACK MISC ONCOLOGY
1.0000 | Freq: Once | Status: AC | PRN
  Filled 2013-05-08: qty 1

## 2013-05-08 MED ORDER — SODIUM CHLORIDE 0.9 % IV SOLN
150.0000 mg | Freq: Once | INTRAVENOUS | Status: AC
  Administered 2013-05-08: 150 mg via INTRAVENOUS
  Filled 2013-05-08: qty 5

## 2013-05-08 MED ORDER — HEPARIN SOD (PORK) LOCK FLUSH 100 UNIT/ML IV SOLN: 250.0000 [IU] | Freq: Once | INTRAVENOUS | Status: AC | PRN

## 2013-05-08 MED ORDER — SODIUM CHLORIDE 0.9 % IJ SOLN: 3.0000 mL | INTRAMUSCULAR | Status: DC | PRN

## 2013-05-08 MED ORDER — POTASSIUM CHLORIDE 2 MEQ/ML IV SOLN
INTRAVENOUS | Status: DC
  Administered 2013-05-08 – 2013-05-09 (×2): via INTRAVENOUS
  Filled 2013-05-08: qty 10

## 2013-05-08 MED ORDER — ALTEPLASE 2 MG IJ SOLR
2.0000 mg | Freq: Once | INTRAMUSCULAR | Status: AC | PRN
  Filled 2013-05-08: qty 2

## 2013-05-08 MED ORDER — SODIUM CHLORIDE 0.9 % IJ SOLN
10.0000 mL | Freq: Two times a day (BID) | INTRAMUSCULAR | Status: DC
  Administered 2013-05-08 – 2013-05-13 (×7): 10 mL

## 2013-05-08 MED ORDER — SODIUM CHLORIDE 0.9 % IJ SOLN
10.0000 mL | INTRAMUSCULAR | Status: DC | PRN
  Administered 2013-05-12: 10 mL

## 2013-05-08 MED ORDER — POTASSIUM CHLORIDE IN NACL 20-0.9 MEQ/L-% IV SOLN
INTRAVENOUS | Status: DC
  Administered 2013-05-08 – 2013-05-09 (×2): via INTRAVENOUS
  Filled 2013-05-08 (×3): qty 1000

## 2013-05-08 MED ORDER — COLD PACK MISC ONCOLOGY
1.0000 | Freq: Once | Status: AC | PRN
  Filled 2013-05-08: qty 1

## 2013-05-08 MED ORDER — SODIUM CHLORIDE 0.9 % IV SOLN
INTRAVENOUS | Status: DC
  Administered 2013-05-08: 20 mL/h via INTRAVENOUS

## 2013-05-08 NOTE — Progress Notes (Signed)
Peripherally Inserted Central Catheter/Midline Placement  The IV Nurse has discussed with the patient and/or persons authorized to consent for the patient, the purpose of this procedure and the potential benefits and risks involved with this procedure.  The benefits include less needle sticks, lab draws from the catheter and patient may be discharged home with the catheter.  Risks include, but not limited to, infection, bleeding, blood clot (thrombus formation), and puncture of an artery; nerve damage and irregular heat beat.  Alternatives to this procedure were also discussed.  PICC/Midline Placement Documentation        Mark [name] 05/08/2013, 1:42 PM

## 2013-05-08 NOTE — Care Management Note (Signed)
   CARE MANAGEMENT NOTE 05/08/2013  Patient:  [name]   Account Number:  [id number]  Date Initiated:  05/08/2013  Documentation initiated by:  Chrisanne Loose  Subjective/Objective Assessment:   42 yo male admitted with Kappa Light Chain Myeloma.     Action/Plan:   Home when stable   Anticipated DC Date:     Anticipated DC Plan:  HOME/SELF CARE      DC Planning Services  CM consult      Choice offered to / List presented to:  NA   DME arranged  NA      DME agency  NA     HH arranged  NA      HH agency  NA   Status of service:  In process, will continue to follow Medicare Important Message given?   (If response is "NO", the following Medicare IM given date fields will be blank) Date Medicare IM given:   Date Additional Medicare IM given:    Discharge Disposition:    Per UR Regulation:  Reviewed for med. necessity/level of care/duration of stay  If discussed at Long Length of Stay Meetings, dates discussed:    Comments:  05/08/13 1507 Mark Bellucci,RN,MSN [phone] Chart reviewed for utilization of services. No needs identified at this time.

## 2013-05-09 ENCOUNTER — Ambulatory Visit (HOSPITAL_COMMUNITY): Payer: BC Managed Care – PPO

## 2013-05-09 DIAGNOSIS — C9000 Multiple myeloma not having achieved remission: Secondary | ICD-10-CM

## 2013-05-09 DIAGNOSIS — Z5111 Encounter for antineoplastic chemotherapy: Principal | ICD-10-CM

## 2013-05-09 DIAGNOSIS — D649 Anemia, unspecified: Secondary | ICD-10-CM

## 2013-05-09 MED ORDER — POTASSIUM CHLORIDE 2 MEQ/ML IV SOLN
INTRAVENOUS | Status: DC
  Filled 2013-05-09: qty 10

## 2013-05-09 MED ORDER — OXYCODONE HCL 5 MG PO TABS: 5.0000 mg | ORAL_TABLET | ORAL | Status: DC | PRN

## 2013-05-09 MED ORDER — FENTANYL 25 MCG/HR TD PT72
25.0000 ug | MEDICATED_PATCH | TRANSDERMAL | Status: DC
  Administered 2013-05-09 – 2013-05-12 (×2): 25 ug via TRANSDERMAL
  Filled 2013-05-09 (×2): qty 1

## 2013-05-09 MED ORDER — BIOTENE DRY MOUTH MT LIQD
15.0000 mL | OROMUCOSAL | Status: DC
  Administered 2013-05-09 – 2013-05-13 (×24): 15 mL via OROMUCOSAL

## 2013-05-09 MED ORDER — IOHEXOL 300 MG/ML  SOLN
25.0000 mL | INTRAMUSCULAR | Status: AC
  Administered 2013-05-09 (×2): 25 mL via ORAL

## 2013-05-09 MED ORDER — FLUCONAZOLE 100 MG PO TABS
100.0000 mg | ORAL_TABLET | Freq: Every day | ORAL | Status: DC
  Administered 2013-05-09 – 2013-05-13 (×5): 100 mg via ORAL
  Filled 2013-05-09 (×5): qty 1

## 2013-05-09 MED ORDER — POTASSIUM CHLORIDE 2 MEQ/ML IV SOLN
INTRAVENOUS | Status: DC
  Administered 2013-05-09 – 2013-05-10 (×3): via INTRAVENOUS
  Filled 2013-05-09 (×10): qty 10

## 2013-05-09 MED ORDER — FAMCICLOVIR 500 MG PO TABS
250.0000 mg | ORAL_TABLET | Freq: Every day | ORAL | Status: DC
  Administered 2013-05-09: 250 mg via ORAL
  Filled 2013-05-09: qty 0.5

## 2013-05-09 MED ORDER — PROCHLORPERAZINE MALEATE 10 MG PO TABS
10.0000 mg | ORAL_TABLET | Freq: Four times a day (QID) | ORAL | Status: DC | PRN
  Administered 2013-05-12: 10 mg via ORAL
  Filled 2013-05-09: qty 1

## 2013-05-09 MED ORDER — FAMOTIDINE 20 MG PO TABS
20.0000 mg | ORAL_TABLET | Freq: Two times a day (BID) | ORAL | Status: DC
  Administered 2013-05-09 – 2013-05-13 (×9): 20 mg via ORAL
  Filled 2013-05-09 (×10): qty 1

## 2013-05-09 MED ORDER — SODIUM BICARBONATE/SODIUM CHLORIDE MOUTHWASH
4.0000 "application " | Freq: Four times a day (QID) | OROMUCOSAL | Status: DC
  Administered 2013-05-09 – 2013-05-13 (×17): 4 via OROMUCOSAL
  Filled 2013-05-09: qty 1000

## 2013-05-09 MED ORDER — MINERAL OIL RE ENEM
1.0000 | ENEMA | Freq: Once | RECTAL | Status: DC
  Filled 2013-05-09: qty 1

## 2013-05-09 MED ORDER — IOHEXOL 300 MG/ML  SOLN
100.0000 mL | Freq: Once | INTRAMUSCULAR | Status: AC | PRN
  Administered 2013-05-09: 100 mL via INTRAVENOUS

## 2013-05-09 NOTE — H&P (Signed)
#   [id number] is admit note.  Evern Core 2:9

## 2013-05-10 DIAGNOSIS — K5900 Constipation, unspecified: Secondary | ICD-10-CM

## 2013-05-10 LAB — GLUCOSE, CAPILLARY

## 2013-05-10 MED ORDER — SENNOSIDES-DOCUSATE SODIUM 8.6-50 MG PO TABS
2.0000 | ORAL_TABLET | Freq: Two times a day (BID) | ORAL | Status: DC
  Administered 2013-05-10 – 2013-05-12 (×5): 2 via ORAL
  Filled 2013-05-10 (×8): qty 2

## 2013-05-10 MED ORDER — INSULIN ASPART 100 UNIT/ML ~~LOC~~ SOLN
0.0000 [IU] | Freq: Every day | SUBCUTANEOUS | Status: DC
  Administered 2013-05-10: 4 [IU] via SUBCUTANEOUS
  Administered 2013-05-12: 3 [IU] via SUBCUTANEOUS

## 2013-05-10 MED ORDER — SODIUM CHLORIDE 0.9 % IV SOLN
90.0000 mg | Freq: Once | INTRAVENOUS | Status: DC
  Filled 2013-05-10: qty 10

## 2013-05-10 MED ORDER — SODIUM CHLORIDE 0.9 % IV SOLN: 90.0000 mg | Freq: Once | INTRAVENOUS | Status: DC

## 2013-05-10 MED ORDER — INSULIN ASPART 100 UNIT/ML ~~LOC~~ SOLN
0.0000 [IU] | Freq: Three times a day (TID) | SUBCUTANEOUS | Status: DC
  Administered 2013-05-10 – 2013-05-11 (×3): 3 [IU] via SUBCUTANEOUS
  Administered 2013-05-11: 8 [IU] via SUBCUTANEOUS
  Administered 2013-05-11: 5 [IU] via SUBCUTANEOUS
  Administered 2013-05-12 (×3): 3 [IU] via SUBCUTANEOUS

## 2013-05-10 MED ORDER — SODIUM CHLORIDE 0.9 % IV SOLN
90.0000 mg | Freq: Once | INTRAVENOUS | Status: AC
  Administered 2013-05-11: 90 mg via INTRAVENOUS
  Filled 2013-05-10: qty 10

## 2013-05-10 NOTE — H&P (Signed)
Mark [name], [name]                 ACCOUNT NO.:  [id number]  MEDICAL RECORD NO.:  [id number]  LOCATION:  1310                         FACILITY:  [facility]  PHYSICIAN:  [name], M.D.  DATE OF BIRTH:  1970-11-25  DATE OF ADMISSION:  05/08/2013 DATE OF DISCHARGE:                             HISTORY & PHYSICAL   REASON FOR ADMISSION:  Cycle #2 of chemotherapy for kappa light chain myeloma.  HISTORY OF PRESENT ILLNESS:  [name] is a very nice 42 year old African American gentleman.  He has kappa light chain myeloma.  He underwent salvage chemotherapy with VD-PACE.  He received his first cycle back on April 17, 2013.  He tolerated this well.  He did get Neulasta postchemotherapy.  He has been on prophylaxis with Diflucan, Levaquin, and Famvir.  He is now being admitted for cycle #2 of chemotherapy.  He is feeling well.  He is not having as much pain.  He is on a fentanyl patch.  This has been quite helpful with respect to his pain.  We did go ahead and repeat the lab work on the 6th.  Nicely enough, this showed as kappa light chain was down to 10.7 mg per day.  Previously, his serum kappa light chain was 51 mg/dL.  He has had no nausea or vomiting.  There has been no problem with bowels or bladder.  There has been no leg swelling.  His calcium has been very well controlled.  Typically, his calcium will go up once myeloma becomes more resistant.  He does have a Port-A-Cath in.  This has not been a problem for him.  PAST MEDICAL HISTORY:  Pretty much unremarkable.  ALLERGIES:  MOTRIN.  ADMISSION MEDICATIONS: 1. Famvir 250 mg p.o. daily. 2. Diflucan 100 mg p.o. daily. 3. Levaquin 500 mg p.o. daily. 4. Fentanyl patch 25 mcg to the skin every 3 days. 5. OxyIR 5-10 mg p.o. q.6 hours p.r.n. 6. Compazine 10 mg p.o. q.6 hours p.r.n. nausea and vomiting.  SOCIAL HISTORY:  Negative for tobacco or alcohol use.  FAMILY HISTORY:  Noncontributory.  PHYSICAL EXAMINATION:   GENERAL:  This is a well-developed, well- nourished, black gentleman in no obvious distress. VITAL SIGNS:  Shows temperature of 98, pulse 85, respiratory rate 18, blood pressure 135/82. HEAD AND NECK:  Shows a normocephalic, atraumatic skull.  Has no ocular or oral lesions.  There are no palpable cervical or supraclavicular lymph nodes. LUNGS:  Clear bilaterally. CARDIAC:  Regular rate and rhythm with a normal S1 and S2.  There are no murmurs, rubs, or bruits. ABDOMEN:  Soft.  He has good bowel sounds.  There is no fluid wave. There is no palpable abdominal mass.  There is no palpable hepatosplenomegaly. EXTREMITIES:  Show no clubbing, cyanosis, or edema. SKIN:  No rashes, ecchymosis, or petechia. NEUROLOGIC:  No focal neurological deficits.  LABORATORY STUDIES:  White cell count 4, hemoglobin 7.7, hematocrit 22.7, platelet count 194.  Sodium 142, potassium 4, BUN 8, creatinine 1. Calcium 6.5.  Alkaline phosphatase 228.  Total protein 6.1 with an albumin of 3.7.  IMPRESSION:  [name] is a 42 year old, African American gentleman with kappa light chain myeloma.  He is responding to treatment.  We now will move ahead with his second cycle of chemotherapy.  Hopefully, we will continue to see his light chains decrease.  I will not make any doses reduction with this chemotherapy.  I will do a 24-hour urine on him.  We will continue his Diflucan and Famvir.  He will give back on the Levaquin once he gets out of the hospital.  We will consider transfusing him, maybe at the end of this hospital stay.  I suspect that he likely will need to be transfused.  We will do a 24-hour urine on him.  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     [name], M.D.     PRE/MEDQ  D:  05/09/2013  T:  05/10/2013  Job:  [id number]

## 2013-05-10 NOTE — Progress Notes (Signed)
No problems with chemo so far.  No nausea or vomiting.  Is OOB ok.  No c/o pain.  No bleeding.  Some constipation.  Will try a laxative.   No fever, chills.  No mouth sores.  97.6 72 16 122/67  HEENT:  Mo mucositis.  No adenopathy. LUNG:  Clear COR:  RRR.  Nl S1 and S2.  No murmurs ABD:  Soft.  (+) BS.  Not tender EXT:  (-) C/C/E CNS:  No focal deficits.  His CT scan looks a little better.    24hr urine is pending.    Labs are pending.  I will give Aredia today.  Check labs in the AM  Pete E.  Psalm 16:8

## 2013-05-11 LAB — COMPREHENSIVE METABOLIC PANEL
Albumin: 3.7 g/dL (ref 3.5–5.2)
BUN: 11 mg/dL (ref 6–23)
CO2: 22 mEq/L (ref 19–32)
Calcium: 7.7 mg/dL — ABNORMAL LOW (ref 8.4–10.5)
Creatinine, Ser: 0.84 mg/dL (ref 0.50–1.35)
GFR calc Af Amer: >90 mL/min (ref >90)
GFR calc non Af Amer: >90 mL/min (ref >90)
Glucose, Bld: 237 mg/dL — ABNORMAL HIGH (ref 70–99)
Sodium: 132 mEq/L — ABNORMAL LOW (ref 135–145)
Total Protein: 6.3 g/dL (ref 6.0–8.3)

## 2013-05-11 LAB — CBC WITH DIFFERENTIAL/PLATELET
Basophils Relative: 0 % (ref 0–1)
Eosinophils Absolute: 0 10*3/uL (ref 0.0–0.7)
Eosinophils Relative: 0 % (ref 0–5)
HCT: 25.2 % — ABNORMAL LOW (ref 39.0–52.0)
Lymphocytes Relative: 2 % — ABNORMAL LOW (ref 12–46)
Lymphs Abs: 0.2 10*3/uL — ABNORMAL LOW (ref 0.7–4.0)
MCH: 31.8 pg (ref 26.0–34.0)
MCV: 94.4 fL (ref 78.0–100.0)
Monocytes Absolute: 0.4 10*3/uL (ref 0.1–1.0)
Monocytes Relative: 6 % (ref 3–12)
Platelets: 208 10*3/uL (ref 150–400)
RDW: 15.9 % — ABNORMAL HIGH (ref 11.5–15.5)
WBC: 7.8 10*3/uL (ref 4.0–10.5)

## 2013-05-11 LAB — GLUCOSE, CAPILLARY
Glucose-Capillary: 251 mg/dL — ABNORMAL HIGH (ref 70–99)
Glucose-Capillary: 216 mg/dL — ABNORMAL HIGH (ref 70–99)

## 2013-05-11 LAB — MAGNESIUM: Magnesium: 2.9 mg/dL — ABNORMAL HIGH (ref 1.5–2.5)

## 2013-05-11 MED ORDER — MAGNESIUM SULFATE 50 % IJ SOLN
INTRAVENOUS | Status: AC
  Administered 2013-05-11: 08:00:00 via INTRAVENOUS
  Filled 2013-05-11 (×2): qty 3

## 2013-05-11 MED ORDER — DEXTROSE-NACL 5-0.9 % IV SOLN
INTRAVENOUS | Status: DC
  Administered 2013-05-11 – 2013-05-12 (×4): via INTRAVENOUS

## 2013-05-11 MED ORDER — BORTEZOMIB CHEMO IV INJECTION 3.5 MG
1.3000 mg/m2 | INTRAMUSCULAR | Status: AC
  Administered 2013-05-11: 2.7 mg via INTRAVENOUS
  Filled 2013-05-11: qty 2.7

## 2013-05-11 NOTE — Progress Notes (Signed)
Mr. Gitto is doing well chemotherapy. I think he has one more day left of infusional chemotherapy.  He's had no nausea vomiting. He is out of bed. The laxatives have help with constipation. There is no cough or shortness of breath. He's had no fever. There's no bleeding. He's not noticed any leg swelling.  His blood sugars are on the high side. Again insulin to help with this.  He will get his Aredia today.  Pharmacy we will adjust his IV fluids because of potassium and magnesium are on the high side.  Is 20 for a urine, surprising, shows that he has a marked increase in kappa light chain excretion. I cannot really explain this as his serum light chains come down by 70%.  His hemoglobin is 8.5. White cell count 7.8. Platelet count 208. I will try to avoid any transfusion on him.  On his vital signs, temperature 97.9. Pulse 67. Blood pressure 130/73. His head in exam shows no mucositis. There is no adenopathy. There is no scleral icterus. Lungs are clear bilaterally. Cardiac exam regular and rhythm with no murmurs rubs or bruits. Abdomen is soft. Has good bowel sounds. Extremities shows no clubbing cyanosis or edema.  We will continue him on chemotherapy. He is getting fantastic care by the nurses and staff on [address]. In  [place].  Phillipians 4:13

## 2013-05-12 LAB — GLUCOSE, CAPILLARY
Glucose-Capillary: 174 mg/dL — ABNORMAL HIGH (ref 70–99)
Glucose-Capillary: 173 mg/dL — ABNORMAL HIGH (ref 70–99)
Glucose-Capillary: 177 mg/dL — ABNORMAL HIGH (ref 70–99)
Glucose-Capillary: 258 mg/dL — ABNORMAL HIGH (ref 70–99)

## 2013-05-12 MED ORDER — ACYCLOVIR 400 MG PO TABS
400.0000 mg | ORAL_TABLET | Freq: Two times a day (BID) | ORAL | Status: DC
  Administered 2013-05-12 – 2013-05-13 (×3): 400 mg via ORAL
  Filled 2013-05-12 (×4): qty 1

## 2013-05-12 NOTE — Progress Notes (Signed)
[name] is still doing well.  No complaints of nausea or vomiting. No shortness of breath. No constipation. There is no pain. He says has not had a Duragesic patch since he came into the hospital.  His chemotherapy will finish up today.  He is out of bed without problems. His appetite is still doing quite well.  [name], temperature 90.8 pulse 78 respiratory 16 blood pressure 118/73. Head exam shows no ocular or oral lesions. There is no palpable cervical or supraclavicular lymph nodes. Lungs are clear bilaterally to. Cardiac exam regular rate and rhythm. Abdomen is soft. Has good bowel sounds. There is no fluid wave. Extremities shows no clubbing cyanosis or edema.  We'll check labs on him tomorrow. Again, he'll finish up his chemotherapy today. Hopefully, we will be able to discharge him tomorrow.  Mark [name].

## 2013-05-13 ENCOUNTER — Other Ambulatory Visit: Payer: Self-pay | Admitting: Hematology & Oncology

## 2013-05-13 ENCOUNTER — Telehealth: Payer: Self-pay | Admitting: Hematology & Oncology

## 2013-05-13 DIAGNOSIS — C9000 Multiple myeloma not having achieved remission: Secondary | ICD-10-CM

## 2013-05-13 LAB — CBC
HCT: 22.2 % — ABNORMAL LOW (ref 39.0–52.0)
Hemoglobin: 7.7 g/dL — ABNORMAL LOW (ref 13.0–17.0)
RDW: 14.9 % (ref 11.5–15.5)
WBC: 2.1 10*3/uL — ABNORMAL LOW (ref 4.0–10.5)

## 2013-05-13 LAB — UIFE/LIGHT CHAINS/TP QN, 24-HR UR
Albumin, U: DETECTED
Beta, Urine: DETECTED — AB
Free Kappa/Lambda Ratio: 263.33 ratio — ABNORMAL HIGH (ref 2.04–10.37)
Free Lambda Excretion/Day: 0.92 mg/d
Free Lambda Lt Chains,Ur: 0.09 mg/dL (ref 0.02–0.67)
Free Lt Chn Excr Rate: 242.93 mg/d
Gamma Globulin, Urine: DETECTED — AB
Time: 24 hours
Total Protein, Urine-Ur/day: 249 mg/d — ABNORMAL HIGH (ref 10–140)
Total Protein, Urine: 24.3 mg/dL
Volume, Urine: 1025 mL

## 2013-05-13 LAB — COMPREHENSIVE METABOLIC PANEL
ALT: 8 U/L (ref 0–53)
Albumin: 3.2 g/dL — ABNORMAL LOW (ref 3.5–5.2)
Alkaline Phosphatase: 159 U/L — ABNORMAL HIGH (ref 39–117)
BUN: 13 mg/dL (ref 6–23)
Chloride: 104 mEq/L (ref 96–112)
GFR calc non Af Amer: >90 mL/min (ref >90)
Potassium: 4 mEq/L (ref 3.5–5.1)
Sodium: 135 mEq/L (ref 135–145)
Total Bilirubin: 0.3 mg/dL (ref 0.3–1.2)

## 2013-05-13 MED ORDER — HEPARIN SOD (PORK) LOCK FLUSH 100 UNIT/ML IV SOLN
500.0000 [IU] | Freq: Once | INTRAVENOUS | Status: AC
  Administered 2013-05-13: 500 [IU] via INTRAVENOUS
  Filled 2013-05-13: qty 5

## 2013-05-13 MED ORDER — SODIUM CHLORIDE 0.9 % IV SOLN
16.0000 mg | Freq: Once | INTRAVENOUS | Status: DC
  Filled 2013-05-13: qty 8

## 2013-05-13 NOTE — Progress Notes (Signed)
Chemotherapy completed last night w/o difficulty. Patient experienced some nausea but he reported no vomiting.

## 2013-05-13 NOTE — Telephone Encounter (Signed)
Called and spoke with Patient's wife and gave her inj apt date and time for 05/14/13.  She stated they will be here

## 2013-05-13 NOTE — Discharge Summary (Signed)
Mark [name], Mark [name]                 ACCOUNT NO.:  [id number]  MEDICAL RECORD NO.:  [id number]  LOCATION:  1310                         FACILITY:  [facility]  PHYSICIAN:  [name], M.D.  DATE OF BIRTH:  1971-01-01  DATE OF ADMISSION:  05/08/2013 DATE OF DISCHARGE:  05/13/2013                              DISCHARGE SUMMARY   DIAGNOSES UPON DISCHARGE: 1. Kappa light chain myeloma. 2. Status post cycle #2 VD-PACE. 3. Anemia secondary to chemotherapy. 4. Insertion of PICC line -- removed upon discharge.  CONDITION ON DISCHARGE:  Stable.  ACTIVITIES:  As tolerated.  DIET:  Without restrictions.  FOLLOWUP: 1. The patient will come to the [facility] on     October 14 for his Neulasta shot. 2. He will come to the office for his regular scheduled appointments     as previously scheduled.  MEDICATIONS UPON DISCHARGE: 1. Famvir 250 mg p.o. daily. 2. Pepcid 20 mg p.o. daily. 3. Fentanyl patch 25 mcg to the skin every 3 days. 4. Diflucan 100 mg p.o. daily. 5. Levaquin 500 mg p.o. daily. 6. OxyIR 5-10 mg p.o. q.4 h. p.r.n. 7. Compazine 10 mg p.o. q.6 h. p.r.n. 8. Senokot-S two p.o. b.i.d. p.r.n.  HOSPITAL COURSE:  Mark [name] was admitted for his second cycle of chemotherapy.  He tolerated his first cycle quite well.  Upon admission, he did require to have a PICC line placed.  This was done by the IV team without difficulty.  Upon admission, his lab work looked pretty good.  His BUN and creatinine were 8 and 1.0.  Alkaline phosphatase was 228.  Calcium was 6.5 with albumin of 3.7.  His hemoglobin was 7.7 and platelet count was 194. White cell count 4.  When we checked his kappa light chains prior to admission, they were down to 10.7 mg per dL; however, we did do a 40-JWJX urine on him.  This is still pending.  He had IV fluids per pharmacy.  He received his chemotherapy.  This was started on the 8th.  He had a body surface area of 2.17 sq. m.  His Velcade  dose was 2.7 mg.  He got this on the 8th and on the 11th. His Adriamycin infusional dose was 22 mg per day for 4 days.  His platinum dose was 21 mg per day for 4 days.  His Cytoxan was given infusion only.  His Cytoxan dose was 840 mg per day for 4 days and his etoposide dose was 84 mg per day for 4 days.  We got IV Decadron 40 mg a day for 4 days.  He is premedicated with Aloxi.  He got Ativan as indicated.  He got Emend.  We also gave him a dose of Zometa while in the hospital.  He received this without any difficulty.  He had really very little trouble with the chemotherapy.  He seemed to tolerate it quite well.  He did have some constipation.  We increased his laxative intake and this helped.  While I was watching his hemoglobin closely, I wanted to try to avoid transfusion if possible.  On the day of discharge, his hemoglobin was 7.7.  I thought that we could avoid the transfusion given his young age.  When he was discharged, his sodium was 135, potassium 4.0, BUN 13, creatinine 0.79, calcium was 6.9.  Alkaline phosphatase was 159. Albumin was 3.2.  LFTs were normal.  His platelet count was 153.  PHYSICAL EXAMINATION:  VITAL SIGNS:  Upon discharge, his vital signs showed a temperature of 98.3, pulse of 72, respiratory rate 18, blood pressure 123/64. HEAD AND NECK:  Showed no ocular or oral lesions.  There was no mucositis.  He had no adenopathy in the neck.  There was no scleral icterus.  His conjunctivae were slightly pale. LUNGS:  Clear bilaterally. CARDIAC:  Regular rate and rhythm with normal S1, S2.  There were no murmurs, rubs, or bruits. ABDOMEN:  Soft.  He had good bowel sounds.  There is no fluid wave. There is no palpable abdominal mass.  There is no palpable hepatosplenomegaly. EXTREMITIES:  Show no clubbing, cyanosis, or edema.  He has good range of motion of the joint.  He has good strength in his arms and legs. SKIN:  No rashes, ecchymosis, or  petechia. NEUROLOGICAL:  No focal neurological deficits.  Again, have his PICC line removed.  I will give him a dose of Zofran 16 mg IV prior to discharge.  He will continue his oral antibiotics as an outpatient.  He has Biotene and sodium bicarb mouth rinses as an outpatient.  I will also plan to make a referral down to [facility] for consideration of stem cell transplantation.     [name], M.D.     PRE/MEDQ  D:  05/13/2013  T:  05/13/2013  Job:  [id number]

## 2013-05-13 NOTE — Discharge Summary (Signed)
#   [id number] is d/c summary.  Pete E.  Philippians 4:7

## 2013-05-13 NOTE — Progress Notes (Signed)
D/c instructions rendered, patient verbalized understanding,denies pain,d/c home, stable- Hulda Marin RN

## 2013-05-14 ENCOUNTER — Ambulatory Visit (HOSPITAL_BASED_OUTPATIENT_CLINIC_OR_DEPARTMENT_OTHER): Payer: BC Managed Care – PPO

## 2013-05-14 VITALS — BP 107/77 | HR 100 | Temp 99.3°F | Resp 18

## 2013-05-14 DIAGNOSIS — C9000 Multiple myeloma not having achieved remission: Secondary | ICD-10-CM

## 2013-05-14 DIAGNOSIS — Z5189 Encounter for other specified aftercare: Secondary | ICD-10-CM

## 2013-05-14 MED ORDER — PEGFILGRASTIM INJECTION 6 MG/0.6ML
6.0000 mg | Freq: Once | SUBCUTANEOUS | Status: AC
  Administered 2013-05-14: 6 mg via SUBCUTANEOUS

## 2013-05-14 MED ORDER — PEGFILGRASTIM INJECTION 6 MG/0.6ML
SUBCUTANEOUS | Status: AC
  Filled 2013-05-14: qty 0.6

## 2013-05-21 ENCOUNTER — Encounter: Payer: Self-pay | Admitting: Nurse Practitioner

## 2013-05-21 NOTE — Progress Notes (Signed)
Spoke with pt and informed him that per [facility], he has an appointment with Dr. Nada Libman on 05/29/13 @ 2pm, he should arrive by 1:30. She stated she has mailed him out an information packet and informed he should receive in the next couple of days. Pt verbalized understanding and appreciation.

## 2013-05-21 NOTE — Progress Notes (Signed)
As requested from [facility], I have notified radiology to send CDs and reports of the patient's original dx and any recent studies. Also contacted Pathology to send all Pathology slides, staging studies including BMBX reports. Both departments verbalized understanding and the need. Fed ex# given and pt identifiers. Information sheet also faxed for reference.

## 2013-06-03 ENCOUNTER — Encounter: Payer: Self-pay | Admitting: *Deleted

## 2013-06-03 ENCOUNTER — Ambulatory Visit (HOSPITAL_BASED_OUTPATIENT_CLINIC_OR_DEPARTMENT_OTHER): Payer: BC Managed Care – PPO | Admitting: Hematology & Oncology

## 2013-06-03 ENCOUNTER — Ambulatory Visit (HOSPITAL_BASED_OUTPATIENT_CLINIC_OR_DEPARTMENT_OTHER): Payer: BC Managed Care – PPO | Admitting: Lab

## 2013-06-03 VITALS — BP 131/79 | HR 109 | Temp 98.7°F | Resp 18 | Ht 71.0 in | Wt 210.0 lb

## 2013-06-03 DIAGNOSIS — C9000 Multiple myeloma not having achieved remission: Secondary | ICD-10-CM

## 2013-06-03 LAB — CBC WITH DIFFERENTIAL (CANCER CENTER ONLY)
BASO#: 0 10*3/uL (ref 0.0–0.2)
BASO%: 0.3 % (ref 0.0–2.0)
EOS%: 0.3 % (ref 0.0–7.0)
Eosinophils Absolute: 0 10*3/uL (ref 0.0–0.5)
HCT: 27.3 % — ABNORMAL LOW (ref 38.7–49.9)
HGB: 8.8 g/dL — ABNORMAL LOW (ref 13.0–17.1)
LYMPH#: 0.7 10*3/uL — ABNORMAL LOW (ref 0.9–3.3)
LYMPH%: 17.5 % (ref 14.0–48.0)
MCH: 32.2 pg (ref 28.0–33.4)
MCHC: 32.2 g/dL (ref 32.0–35.9)
MONO%: 15.6 % — ABNORMAL HIGH (ref 0.0–13.0)
NEUT#: 2.5 10*3/uL (ref 1.5–6.5)
NEUT%: 66.3 % (ref 40.0–80.0)
RDW: 15.7 % (ref 11.1–15.7)
WBC: 3.8 10*3/uL — ABNORMAL LOW (ref 4.0–10.0)

## 2013-06-03 LAB — CMP (CANCER CENTER ONLY)
ALT(SGPT): 18 U/L (ref 10–47)
Alkaline Phosphatase: 107 U/L — ABNORMAL HIGH (ref 26–84)
CO2: 26 mEq/L (ref 18–33)
Calcium: 7.5 mg/dL — ABNORMAL LOW (ref 8.0–10.3)
Chloride: 104 mEq/L (ref 98–108)
Sodium: 139 mEq/L (ref 128–145)
Total Bilirubin: 0.8 mg/dl (ref 0.20–1.60)
Total Protein: 6.9 g/dL (ref 6.4–8.1)

## 2013-06-03 LAB — LACTATE DEHYDROGENASE: LDH: 342 U/L — ABNORMAL HIGH (ref 94–250)

## 2013-06-03 NOTE — Progress Notes (Signed)
Spoke to Metcalf in Patient Placement. Made reservation for admission on 06/06/13. He is to receive chemo x 5 days per Dr Myna Hidalgo. Pt understands he will be called with a time to come in.

## 2013-06-03 NOTE — Progress Notes (Signed)
This office note has been dictated.

## 2013-06-04 NOTE — Progress Notes (Signed)
DIAGNOSES: 1. Kappa light chain myeloma. 2. 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 3. 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  CURRENT THERAPY: 1. The patient is status post 2 cycles of VD-PACE. 2. Zometa every month. 3. Neulasta for chemotherapy-induced neutropenia.  INTERIM HISTORY:  Mark [name] comes in for followup.  He really looks great.  He has responded very nicely to chemotherapy.  When we did his 24-hour urine in the hospital, his kappa light chain went from 8870 down to 242.  His serum light chain went from 41 down to 10.  He went to [facility]  He saw Dr. Marissa Calamity.  They will do a transplant on him.  They offered him either allogeneic or autologous.  He wants to go with autologous.  He has a very strong faith and knows that God will use him and use his bone marrow to get him better.  He has had no problems.  Pain resolved with Duragesic patch.  There is no cough or shortness of breath.  He has had no fevers, sweats, or chills.  He has had no rashes.  He has had no swelling.  Of note, he is on acyclovir, Diflucan, and Levaquin as prophylaxis.  PHYSICAL EXAMINATION:  General:  This is a well-developed, well- nourished African American gentleman, in no obvious distress.  Vital Signs:  Temperature of 98.7, pulse 109, respiratory rate 18, blood pressure 131/79, weight is 210 pounds.  Head and neck exam shows a normocephalic, atraumatic skull.  There are no ocular or oral lesions. There are no palpable cervical or supraclavicular lymph nodes.  Lungs are clear bilaterally.  Cardiac:  Regular rate and rhythm with a normal S1 and S2.  There are no murmurs, rubs, or bruits.  Abdomen is soft.  He has good bowel sounds.  There is no palpable abdominal mass.  There is no palpable hepatosplenomegaly.  Extremities show no clubbing, cyanosis, or edema.  He has good range of motion of his joints.  He has good strength in his arms and legs.  Skin:  No rashes, ecchymosis, or petechia.   Neurological exam shows no focal neurological deficits.  LABORATORY STUDIES:  White cell count is 3.8, hemoglobin 8.8, hematocrit 27.3, platelet count 203.  Sodium 139, potassium 4.3, BUN 9, creatinine 1.2.  Calcium is 7.5, with an albumin of 4.1.  IMPRESSION:  Mark [name] is a 42 year old gentleman with kappa light chain myeloma.  He presented with multiple plasmacytomas.  He has had radiation to the lumbosacral spine.  Again, he has responded to chemotherapy that in essence is almost like we are treating plasma cell leukemia.  He has multiple cytogenetic abnormalities.  We will go ahead and get him admitted for his 3rd cycle on June 06, 2013.  He goes back to [facility] on the 12th.  At that point in time, they will decide when to do the transplant.  I want him to come back here on the 13th for treatment with Neulasta.  I reviewed all of his lab work with him.  I gave him a copy of the lab work.  I looked at his blood smear, and his blood smear looked really, really good.  I will plan to get him back to see me the week of Thanksgiving and hopefully get him admitted on the 28th for his fourth and final cycle of treatment.    ______________________________ [name], M.D. PRE/MEDQ  D:  06/03/2013  T:  06/04/2013  Job:  [id number]

## 2013-06-05 LAB — PROTEIN ELECTROPHORESIS, SERUM, WITH REFLEX
Albumin ELP: 67.6 % — ABNORMAL HIGH (ref 55.8–66.1)
Alpha-1-Globulin: 5.5 % — ABNORMAL HIGH (ref 2.9–4.9)
Gamma Globulin: 7.1 % — ABNORMAL LOW (ref 11.1–18.8)

## 2013-06-05 LAB — IGG, IGA, IGM
IgA: 28 mg/dL — ABNORMAL LOW (ref 68–379)
IgM, Serum: 7 mg/dL — ABNORMAL LOW (ref 41–251)

## 2013-06-05 LAB — KAPPA/LAMBDA LIGHT CHAINS: Kappa:Lambda Ratio: 3.81 — ABNORMAL HIGH (ref 0.26–1.65)

## 2013-06-05 LAB — IFE INTERPRETATION

## 2013-06-06 ENCOUNTER — Other Ambulatory Visit: Payer: Self-pay

## 2013-06-06 ENCOUNTER — Encounter (HOSPITAL_COMMUNITY): Payer: Self-pay

## 2013-06-06 ENCOUNTER — Inpatient Hospital Stay (HOSPITAL_COMMUNITY): Payer: BC Managed Care – PPO

## 2013-06-06 ENCOUNTER — Encounter: Payer: Self-pay | Admitting: Hematology & Oncology

## 2013-06-06 ENCOUNTER — Inpatient Hospital Stay (HOSPITAL_COMMUNITY)
Admission: AD | Admit: 2013-06-06 | Discharge: 2013-06-11 | DRG: 847 | Disposition: A | Discharge status: Home / Self Care | Payer: BC Managed Care – PPO | Source: Ambulatory Visit | Attending: Hematology & Oncology | Admitting: Hematology & Oncology

## 2013-06-06 DIAGNOSIS — N182 Chronic kidney disease, stage 2 (mild): Secondary | ICD-10-CM

## 2013-06-06 DIAGNOSIS — N179 Acute kidney failure, unspecified: Secondary | ICD-10-CM

## 2013-06-06 DIAGNOSIS — R51 Headache: Secondary | ICD-10-CM

## 2013-06-06 DIAGNOSIS — T451X5A Adverse effect of antineoplastic and immunosuppressive drugs, initial encounter: Secondary | ICD-10-CM | POA: Diagnosis present

## 2013-06-06 DIAGNOSIS — D6481 Anemia due to antineoplastic chemotherapy: Secondary | ICD-10-CM | POA: Diagnosis present

## 2013-06-06 DIAGNOSIS — D649 Anemia, unspecified: Secondary | ICD-10-CM

## 2013-06-06 DIAGNOSIS — Z79899 Other long term (current) drug therapy: Secondary | ICD-10-CM

## 2013-06-06 DIAGNOSIS — M549 Dorsalgia, unspecified: Secondary | ICD-10-CM

## 2013-06-06 DIAGNOSIS — Z5111 Encounter for antineoplastic chemotherapy: Principal | ICD-10-CM

## 2013-06-06 DIAGNOSIS — T380X5A Adverse effect of glucocorticoids and synthetic analogues, initial encounter: Secondary | ICD-10-CM | POA: Diagnosis present

## 2013-06-06 DIAGNOSIS — C9000 Multiple myeloma not having achieved remission: Secondary | ICD-10-CM

## 2013-06-06 DIAGNOSIS — K5900 Constipation, unspecified: Secondary | ICD-10-CM

## 2013-06-06 DIAGNOSIS — R112 Nausea with vomiting, unspecified: Secondary | ICD-10-CM

## 2013-06-06 DIAGNOSIS — R7309 Other abnormal glucose: Secondary | ICD-10-CM | POA: Diagnosis present

## 2013-06-06 DIAGNOSIS — M899 Disorder of bone, unspecified: Secondary | ICD-10-CM

## 2013-06-06 DIAGNOSIS — E860 Dehydration: Secondary | ICD-10-CM

## 2013-06-06 DIAGNOSIS — Z923 Personal history of irradiation: Secondary | ICD-10-CM

## 2013-06-06 DIAGNOSIS — M898X9 Other specified disorders of bone, unspecified site: Secondary | ICD-10-CM

## 2013-06-06 LAB — COMPREHENSIVE METABOLIC PANEL
Albumin: 4.3 g/dL (ref 3.5–5.2)
Alkaline Phosphatase: 124 U/L — ABNORMAL HIGH (ref 39–117)
BUN: 10 mg/dL (ref 6–23)
Creatinine, Ser: 1.08 mg/dL (ref 0.50–1.35)
GFR calc Af Amer: >90 mL/min (ref >90)
Glucose, Bld: 77 mg/dL (ref 70–99)
Potassium: 3.9 mEq/L (ref 3.5–5.1)
Total Protein: 6.8 g/dL (ref 6.0–8.3)

## 2013-06-06 LAB — CBC WITH DIFFERENTIAL/PLATELET
Basophils Absolute: 0 10*3/uL (ref 0.0–0.1)
Eosinophils Absolute: 0 10*3/uL (ref 0.0–0.7)
Eosinophils Relative: 0 % (ref 0–5)
HCT: 27.4 % — ABNORMAL LOW (ref 39.0–52.0)
Hemoglobin: 9.2 g/dL — ABNORMAL LOW (ref 13.0–17.0)
Lymphs Abs: 0.8 10*3/uL (ref 0.7–4.0)
MCH: 31.9 pg (ref 26.0–34.0)
MCV: 95.1 fL (ref 78.0–100.0)
Monocytes Absolute: 1.2 10*3/uL — ABNORMAL HIGH (ref 0.1–1.0)
Monocytes Relative: 24 % — ABNORMAL HIGH (ref 3–12)
Neutro Abs: 2.9 10*3/uL (ref 1.7–7.7)
Neutrophils Relative %: 60 % (ref 43–77)
RBC: 2.88 MIL/uL — ABNORMAL LOW (ref 4.22–5.81)

## 2013-06-06 LAB — MAGNESIUM: Magnesium: 1.8 mg/dL (ref 1.5–2.5)

## 2013-06-06 MED ORDER — BORTEZOMIB CHEMO IV INJECTION 3.5 MG
1.3000 mg/m2 | INTRAMUSCULAR | Status: AC
  Administered 2013-06-06 – 2013-06-09 (×2): 2.7 mg via INTRAVENOUS
  Filled 2013-06-06 (×2): qty 2.7

## 2013-06-06 MED ORDER — SODIUM CHLORIDE 0.9 % IJ SOLN
10.0000 mL | INTRAMUSCULAR | Status: DC | PRN
  Administered 2013-06-07: 10 mL

## 2013-06-06 MED ORDER — ALTEPLASE 2 MG IJ SOLR
2.0000 mg | Freq: Once | INTRAMUSCULAR | Status: AC | PRN
  Filled 2013-06-06: qty 2

## 2013-06-06 MED ORDER — CYCLOPHOSPHAMIDE CHEMO INJECTION 1 GM
INTRAVENOUS | Status: AC
  Administered 2013-06-06: 945 mg via INTRAVENOUS
  Administered 2013-06-07: 21 mg via INTRAVENOUS
  Administered 2013-06-08: 100 mg via INTRAVENOUS
  Administered 2013-06-09: 21 mg via INTRAVENOUS
  Filled 2013-06-06 (×4): qty 21

## 2013-06-06 MED ORDER — HEPARIN SOD (PORK) LOCK FLUSH 100 UNIT/ML IV SOLN: 250.0000 [IU] | Freq: Once | INTRAVENOUS | Status: AC | PRN

## 2013-06-06 MED ORDER — BISACODYL 5 MG PO TBEC
5.0000 mg | DELAYED_RELEASE_TABLET | Freq: Once | ORAL | Status: AC
  Administered 2013-06-06: 5 mg via ORAL
  Filled 2013-06-06: qty 1

## 2013-06-06 MED ORDER — ACYCLOVIR 200 MG PO CAPS
400.0000 mg | ORAL_CAPSULE | Freq: Two times a day (BID) | ORAL | Status: DC
  Administered 2013-06-06 – 2013-06-10 (×8): 400 mg via ORAL
  Filled 2013-06-06 (×11): qty 2

## 2013-06-06 MED ORDER — PALONOSETRON HCL INJECTION 0.25 MG/5ML
0.2500 mg | INTRAVENOUS | Status: AC
  Administered 2013-06-06 – 2013-06-08 (×2): 0.25 mg via INTRAVENOUS
  Filled 2013-06-06 (×2): qty 5

## 2013-06-06 MED ORDER — SODIUM CHLORIDE 0.9 % IV SOLN
150.0000 mg | Freq: Once | INTRAVENOUS | Status: AC
  Administered 2013-06-06: 150 mg via INTRAVENOUS
  Filled 2013-06-06: qty 5

## 2013-06-06 MED ORDER — ENOXAPARIN SODIUM 40 MG/0.4ML ~~LOC~~ SOLN
40.0000 mg | SUBCUTANEOUS | Status: DC
  Administered 2013-06-06 – 2013-06-08 (×3): 40 mg via SUBCUTANEOUS
  Filled 2013-06-06 (×6): qty 0.4

## 2013-06-06 MED ORDER — SODIUM CHLORIDE 0.9 % IJ SOLN
10.0000 mL | Freq: Two times a day (BID) | INTRAMUSCULAR | Status: DC
  Administered 2013-06-06: 20 mL
  Administered 2013-06-09: 10 mL
  Administered 2013-06-09: 20 mL
  Administered 2013-06-10 – 2013-06-11 (×2): 10 mL

## 2013-06-06 MED ORDER — COLD PACK MISC ONCOLOGY
1.0000 | Freq: Once | Status: AC | PRN
  Filled 2013-06-06: qty 1

## 2013-06-06 MED ORDER — SODIUM CHLORIDE 0.9 % IV SOLN
INTRAVENOUS | Status: AC
  Administered 2013-06-06: 15:00:00 via INTRAVENOUS

## 2013-06-06 MED ORDER — SODIUM CHLORIDE 0.9 % IJ SOLN: 10.0000 mL | INTRAMUSCULAR | Status: DC | PRN

## 2013-06-06 MED ORDER — SODIUM CHLORIDE 0.9 % IV SOLN
INTRAVENOUS | Status: DC
  Administered 2013-06-08: 20 mL/h via INTRAVENOUS

## 2013-06-06 MED ORDER — HOT PACK MISC ONCOLOGY
1.0000 | Freq: Once | Status: AC | PRN
  Filled 2013-06-06: qty 1

## 2013-06-06 MED ORDER — DEXAMETHASONE 6 MG PO TABS
40.0000 mg | ORAL_TABLET | ORAL | Status: AC
  Administered 2013-06-06 – 2013-06-09 (×4): 40 mg via ORAL
  Filled 2013-06-06 (×5): qty 1

## 2013-06-06 MED ORDER — SODIUM CHLORIDE 0.9 % IJ SOLN: 3.0000 mL | INTRAMUSCULAR | Status: DC | PRN

## 2013-06-06 MED ORDER — SODIUM CHLORIDE 0.9 % IV SOLN
10.0000 mg/m2 | INTRAVENOUS | Status: AC
  Administered 2013-06-06 – 2013-06-09 (×4): 22 mg via INTRAVENOUS
  Filled 2013-06-06 (×4): qty 11

## 2013-06-06 MED ORDER — HEPARIN SOD (PORK) LOCK FLUSH 100 UNIT/ML IV SOLN: 500.0000 [IU] | Freq: Once | INTRAVENOUS | Status: AC | PRN

## 2013-06-06 MED ORDER — POTASSIUM CHLORIDE 2 MEQ/ML IV SOLN
INTRAVENOUS | Status: DC
  Administered 2013-06-06: 19:00:00 via INTRAVENOUS
  Filled 2013-06-06 (×3): qty 10

## 2013-06-06 NOTE — H&P (Signed)
#   [id number] is admit note.  Mark [name] e.  1 Samuel 16:7

## 2013-06-06 NOTE — Care Management Note (Signed)
   CARE MANAGEMENT NOTE 06/06/2013  Patient:  [facility]   Account Number:  [id number]  Date Initiated:  06/06/2013  Documentation initiated by:  Jinny Sweetland  Subjective/Objective Assessment:   42 yo male admitted with kappa light myeloma.     Action/Plan:   Home when stable   Anticipated DC Date:     Anticipated DC Plan:  HOME/SELF CARE      DC Planning Services  CM consult      Choice offered to / List presented to:  NA   DME arranged  NA      DME agency  NA     HH arranged  NA      HH agency  NA   Status of service:  Completed, signed off Medicare Important Message given?   (If response is "NO", the following Medicare IM given date fields will be blank) Date Medicare IM given:   Date Additional Medicare IM given:    Discharge Disposition:    Per UR Regulation:  Reviewed for med. necessity/level of care/duration of stay  If discussed at Long Length of Stay Meetings, dates discussed:    Comments:  06/06/13 1353 Lovelace Cerveny,RN,MSN [phone] Chart reviewed for utilization of services. PTA pt independent. Oncologist: Dr. Mahala Menghini. no needs or barriers to dc idnetified at this time.

## 2013-06-06 NOTE — Progress Notes (Signed)
Peripherally Inserted Central Catheter/Midline Placement  The IV Nurse has discussed with the patient and/or persons authorized to consent for the patient, the purpose of this procedure and the potential benefits and risks involved with this procedure.  The benefits include less needle sticks, lab draws from the catheter and patient may be discharged home with the catheter.  Risks include, but not limited to, infection, bleeding, blood clot (thrombus formation), and puncture of an artery; nerve damage and irregular heat beat.  Alternatives to this procedure were also discussed.  PICC/Midline Placement Documentation        Vevelyn Pat 06/06/2013, 2:41 PM

## 2013-06-07 LAB — BASIC METABOLIC PANEL
BUN: 11 mg/dL (ref 6–23)
CO2: 23 mEq/L (ref 19–32)
Creatinine, Ser: 0.86 mg/dL (ref 0.50–1.35)
GFR calc Af Amer: >90 mL/min (ref >90)
GFR calc non Af Amer: >90 mL/min (ref >90)
Glucose, Bld: 172 mg/dL — ABNORMAL HIGH (ref 70–99)
Potassium: 5.1 mEq/L (ref 3.5–5.1)
Sodium: 136 mEq/L (ref 135–145)

## 2013-06-07 LAB — URINALYSIS, ROUTINE W REFLEX MICROSCOPIC
Bilirubin Urine: NEGATIVE
Glucose, UA: NEGATIVE mg/dL
Hgb urine dipstick: NEGATIVE
Protein, ur: NEGATIVE mg/dL
Specific Gravity, Urine: 1.007 (ref 1.005–1.030)
Urobilinogen, UA: 0.2 mg/dL (ref 0.0–1.0)

## 2013-06-07 MED ORDER — FAMOTIDINE 20 MG PO TABS
20.0000 mg | ORAL_TABLET | Freq: Two times a day (BID) | ORAL | Status: DC
  Administered 2013-06-07 – 2013-06-10 (×7): 20 mg via ORAL
  Filled 2013-06-07 (×9): qty 1

## 2013-06-07 MED ORDER — POTASSIUM CHLORIDE 2 MEQ/ML IV SOLN
INTRAVENOUS | Status: AC
  Administered 2013-06-07 – 2013-06-09 (×3): via INTRAVENOUS
  Filled 2013-06-07 (×5): qty 10

## 2013-06-07 MED ORDER — BISACODYL 5 MG PO TBEC
10.0000 mg | DELAYED_RELEASE_TABLET | Freq: Two times a day (BID) | ORAL | Status: DC | PRN
  Administered 2013-06-07 – 2013-06-09 (×3): 10 mg via ORAL
  Filled 2013-06-07 (×3): qty 2

## 2013-06-07 NOTE — Progress Notes (Signed)
Mr. Dentinger is doing well so far. He is having no issues with chemotherapy. He is constipated which is not uncommon.  He's had no nausea vomiting. He's had no bleeding. There is no cough.  His admission chest x-ray looked fine. His admission EKG looked okay.  His appetite is decent.  Pain wise he's not having problems. I think we can probably hold on doing scans on him for right now.  His last kappa light chain was already down to 4.2.  His physical exam shows a temperature 98.9. Pulse is 102. Blood pressure 124/58. Head exam shows no ocular or oral lesions. He has no thrush. Lungs are clear bilaterally. Cardiac exam regular rate and rhythm with no murmurs rubs or bruits. Abdomen is soft. Has decent bowel sounds. Extremities shows no clubbing cyanosis. He has no edema. He has good strength. Skin exam no rashes. Neurological exam shows no focal neurological deficits.  We will continue him on his chemotherapy. It is working very nicely. He's tolerated well.  We will see what his 24-hour urine shows.  He is on VD-PACE. He has a strong faith.  Megan Mans 17:47

## 2013-06-07 NOTE — H&P (Signed)
Mark [name], Mark [name]                 ACCOUNT NO.:  [id number]  MEDICAL RECORD NO.:  [id number]  LOCATION:  1303                         FACILITY:  [facility]  PHYSICIAN:  [name], M.D.  DATE OF BIRTH:  [birth year]  DATE OF ADMISSION:  06/06/2013 DATE OF DISCHARGE:                             HISTORY & PHYSICAL   REASON FOR ADMISSION: 1. Cycle #3 of chemotherapy with VD-PACE for kappa light chain     myeloma. 2. Transient hypercalcemia secondary to myeloma. 3. Anemia secondary to chemotherapy.  HISTORY OF PRESENT ILLNESS:  Mark [name] is a very nice 42 year old African American gentleman.  He has a very aggressive light chain myeloma.  He presented in July.  He did not respond to standard front line therapy. He had recurrent bouts of hypercalcemia that were highly indicative of his diseased state.  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  He has had radiation for this.  We finally put him on a acute leukemic regimen.  The VD-PACE regimen has worked very well.  He responded very nicely.  His kappa light chain is now down to 4.2 mg/dL.  Prior to chemo, it was 51.  We are checking a 24-hour urine on him, but his 24-hour urine protein was down to 242 mg a day versus 8900 mg per day.  Last scans we did on prior admission showed an improvement in the plasmacytomas.  He has not had hypercalcemia since starting the VD-PACE program.  He has been to [facility] for consideration of bone marrow transplant. They will take him for a bone marrow transplant.  He wants to use his own stem cells.  As such, he is getting another dose of chemotherapy in hopes of "priming" his bone marrow.  His appetite is good.  He has had no nausea, vomiting.  There has been no problem with bowels or bladder.  He has had no headache.  He has had no leg swelling.  He has had no rashes.  Overall, his performance status is ECOG 0.  PAST MEDICAL HISTORY:  Unremarkable outside  of the kappa light chain myeloma.  ADMISSION MEDICATIONS: 1. Famvir 250 mg p.o. daily. 2. Pepcid 20 mg p.o. b.i.d. 3. Diflucan 100 mg p.o. daily. 4. Lactulose 20 mL p.o. p.r.n. 5. Levaquin 500 mg p.o. daily. 6. Compazine 10 mg p.o. q.6 hours p.r.n. 7. Oxycodone 5-10 mg p.o. q.4 hours p.r.n. pain.  SOCIAL HISTORY:  Negative for tobacco or alcohol use.  FAMILY HISTORY:  Noncontributory.  REVIEW OF SYSTEMS:  As stated in the history of present illness.  PHYSICAL EXAMINATION:  GENERAL:  This is a well-developed, well- nourished African American gentleman, in no obvious distress. VITAL SIGNS:  Temperature of 98.8, pulse is 109, respiratory rate 18, blood pressure 131/79, and weight is 210 pounds. HEAD AND NECK:  Normocephalic, atraumatic skull.  He has no ocular or oral lesions. NECK:  There are no palpable cervical or supraclavicular lymph nodes. LUNGS:  Clear bilaterally. CARDIAC:  Regular rate and rhythm with normal S1, S2.  There are no murmurs, rubs, or bruits. ABDOMEN:  Soft.  He has good bowel sounds.  There is no  fluid wave. There is no palpable abdominal mass.  There is no palpable hepatosplenomegaly. EXTREMITIES:  No clubbing, cyanosis, or edema. NEUROLOGICAL:  No focal neurological deficits. SKIN:  No rashes, ecchymosis, or petechiae.  LABORATORY STUDIES:  White cell count 4.8, hemoglobin 9.2, hematocrit 27.4, platelet count 216.  BUN 10, creatinine 1.08.  Calcium 8.6 with an albumin of 4.3.  Liver function tests are normal.  Sodium 139, potassium 3.9.  IMPRESSION:  Mark [name] is a very nice 42 year old African American gentleman with kappa light chain myeloma.  Again, this has been very aggressive.  However, he is responding to treatment.  His most recent serum kappa light chain has come down by half from his last done level.  We will see what his 24-hour urine shows.  We will have to believe that this also will show response.  We will access the Port-A-Cath.  He  does need to have a I think PICC line placed so that we can give the chemotherapy in addition to what is in his Port-A-Cath.  I do not think we got to do any scans on him this admission.  Again, his myeloma studies have improved.  So I would have to believe that his plasmocytoma is also improving.  We will hydrate him adequately.  He will get adequate antiemetics.  He will go back to [facility] next week, so they can begin the process of his transplant preparation.     [name], M.D.    PRE/MEDQ  D:  06/06/2013  T:  06/07/2013  Job:  [id number]

## 2013-06-08 DIAGNOSIS — D649 Anemia, unspecified: Secondary | ICD-10-CM

## 2013-06-08 DIAGNOSIS — C9000 Multiple myeloma not having achieved remission: Secondary | ICD-10-CM

## 2013-06-08 DIAGNOSIS — Z5111 Encounter for antineoplastic chemotherapy: Principal | ICD-10-CM

## 2013-06-08 LAB — BASIC METABOLIC PANEL
BUN: 11 mg/dL (ref 6–23)
CO2: 21 mEq/L (ref 19–32)
Chloride: 108 mEq/L (ref 96–112)
Creatinine, Ser: 0.87 mg/dL (ref 0.50–1.35)
GFR calc Af Amer: >90 mL/min (ref >90)
GFR calc non Af Amer: >90 mL/min (ref >90)
Glucose, Bld: 183 mg/dL — ABNORMAL HIGH (ref 70–99)
Potassium: 4.6 mEq/L (ref 3.5–5.1)

## 2013-06-08 LAB — PROTEIN, URINE, 24 HOUR: Protein, Urine: <3 mg/dL

## 2013-06-08 NOTE — Progress Notes (Signed)
Mark [name]   DOB:04-May-1971   UX#:[id number]   OZD#:[id number]  Subjective: Patient tolerating chemotherapy very well. He is currently receiving cycle #3 of VD-PACE for light chain myeloma. He is denying any fevers chills night sweats headaches he has no shortness of breath or chest pains. His vitals are stable. He has no nausea or vomiting.   Objective:  Filed Vitals:   06/08/13 0617  BP: 129/84  Pulse: 77  Temp: 98.2 F (36.8 C)  Resp: 20    Body mass index is 29.3 kg/(m^2).  Intake/Output Summary (Last 24 hours) at 06/08/13 1138 Last data filed at 06/07/13 2338  Gross per 24 hour  Intake   1897 ml  Output   2700 ml  Net   -803 ml     Sclerae unicteric  Oropharynx clear  No peripheral adenopathy  Lungs clear -- no rales or rhonchi  Heart regular rate and rhythm  Abdomen benign  MSK no focal spinal tenderness, no peripheral edema  Neuro nonfocal    CBG (last 3)  No results found for this basename: GLUCAP,  in the last 72 hours   Labs:  Lab Results  Component Value Date   WBC 4.8 06/06/2013   HGB 9.2* 06/06/2013   HCT 27.4* 06/06/2013   MCV 95.1 06/06/2013   PLT 216 06/06/2013   NEUTROABS 2.9 06/06/2013    Urine Studies No results found for this basename: UACOL, UAPR, USPG, UPH, UTP, UGL, UKET, UBIL, UHGB, UNIT, UROB, ULEU, UEPI, UWBC, URBC, UBAC, CAST, CRYS, UCOM, BILUA,  in the last 72 hours  Basic Metabolic Panel:  Recent Labs Lab 06/03/13 0841  06/06/13 1313 06/07/13 0430 06/08/13 0530  NA 139  --  139 136 138  K 4.3  < > 3.9 5.1 4.6  CL 104  --  104 107 108  CO2 26  --  27 23 21   GLUCOSE 166*  --  77 172* 183*  BUN 9  --  10 11 11   CREATININE 1.2  --  1.08 0.86 0.87  CALCIUM 7.5*  --  8.6 8.8 8.6  MG  --   --  1.8 2.2 2.6*  PHOS  --   --  4.8*  --   --   < > = values in this interval not displayed. GFR Estimated Creatinine Clearance: 133 ml/min (by C-G formula based on Cr of 0.87). Liver Function Tests:  Recent Labs Lab 06/03/13 0841  06/06/13 1313  AST 22 19  ALT 18 15  ALKPHOS 107* 124*  BILITOT 0.80 0.6  PROT 6.9 6.8  ALBUMIN  --  4.3   No results found for this basename: LIPASE, AMYLASE,  in the last 168 hours No results found for this basename: AMMONIA,  in the last 168 hours Coagulation profile No results found for this basename: INR, PROTIME,  in the last 168 hours  CBC:  Recent Labs Lab 06/03/13 0841 06/06/13 1313  WBC 3.8* 4.8  NEUTROABS 2.5 2.9  HGB 8.8* 9.2*  HCT 27.3* 27.4*  MCV 100* 95.1  PLT 203 216   Cardiac Enzymes: No results found for this basename: CKTOTAL, CKMB, CKMBINDEX, TROPONINI,  in the last 168 hours BNP: No components found with this basename: POCBNP,  CBG: No results found for this basename: GLUCAP,  in the last 168 hours D-Dimer No results found for this basename: DDIMER,  in the last 72 hours Hgb A1c No results found for this basename: HGBA1C,  in the last 72 hours Lipid Profile No  results found for this basename: CHOL, HDL, LDLCALC, TRIG, CHOLHDL, LDLDIRECT,  in the last 72 hours Thyroid function studies No results found for this basename: TSH, T4TOTAL, FREET3, T3FREE, THYROIDAB,  in the last 72 hours Anemia work up No results found for this basename: VITAMINB12, FOLATE, FERRITIN, TIBC, IRON, RETICCTPCT,  in the last 72 hours Microbiology No results found for this or any previous visit (from the past 240 hour(s)).    Studies:  X-ray Chest Pa And Lateral   06/06/2013   CLINICAL DATA:  Admission chest radiograph. Patient on for date chemotherapy regimen.  EXAM: CHEST  2 VIEW  COMPARISON:  05/08/2013  FINDINGS: Cardiac silhouette is normal in size. Mediastinum is normal in contour. No hilar masses.  Few minor areas of reticular lung scarring. The lungs are otherwise clear. No pleural effusion or pneumothorax.  Right anterior chest wall power Port-A-Cath has its tip in the lower superior vena cava, well positioned.  Bony thorax is intact. No osteoblastic or osteolytic  lesions.  IMPRESSION: No active cardiopulmonary disease.   Electronically Signed   By: Amie Portland M.D.   On: 06/06/2013 14:05    Assessment: 42 y.o. with kappa light chain myeloma and hypercalcemia secondary to myeloma as well as anemia secondary to chemotherapy. He is admitted to [facility] for inpatient chemotherapy consisting of VD-PACE. He eventually will undergo autologous bone marrow transplant at [facility]. He does have an upcoming appointment.    Plan:   #1 continue day 2 of chemotherapy.  #2 blood work is reviewed his calcium and potassium are normal. Magnesium slightly elevated but we will continue to monitor.  #3 anti-emetics: He has appropriate medications to take as needed.  #4 disposition: Hopefully he may be able to go home early next week.   Drue Second 06/08/2013

## 2013-06-09 DIAGNOSIS — D6481 Anemia due to antineoplastic chemotherapy: Secondary | ICD-10-CM

## 2013-06-09 DIAGNOSIS — R7309 Other abnormal glucose: Secondary | ICD-10-CM

## 2013-06-09 LAB — GLUCOSE, CAPILLARY: Glucose-Capillary: 170 mg/dL — ABNORMAL HIGH (ref 70–99)

## 2013-06-09 LAB — COMPREHENSIVE METABOLIC PANEL
ALT: 10 U/L (ref 0–53)
AST: 11 U/L (ref 0–37)
CO2: 21 mEq/L (ref 19–32)
Calcium: 8.3 mg/dL — ABNORMAL LOW (ref 8.4–10.5)
GFR calc Af Amer: >90 mL/min (ref >90)
Glucose, Bld: 263 mg/dL — ABNORMAL HIGH (ref 70–99)
Sodium: 135 mEq/L (ref 135–145)
Total Protein: 5.9 g/dL — ABNORMAL LOW (ref 6.0–8.3)

## 2013-06-09 LAB — CBC
HCT: 24 % — ABNORMAL LOW (ref 39.0–52.0)
MCH: 32 pg (ref 26.0–34.0)
MCHC: 34.2 g/dL (ref 30.0–36.0)
Platelets: 191 10*3/uL (ref 150–400)
RBC: 2.56 MIL/uL — ABNORMAL LOW (ref 4.22–5.81)

## 2013-06-09 MED ORDER — LORAZEPAM 2 MG/ML IJ SOLN
1.0000 mg | Freq: Four times a day (QID) | INTRAMUSCULAR | Status: DC | PRN
  Administered 2013-06-10 – 2013-06-11 (×4): 1 mg via INTRAVENOUS
  Filled 2013-06-09 (×4): qty 1

## 2013-06-09 MED ORDER — PROCHLORPERAZINE MALEATE 10 MG PO TABS
10.0000 mg | ORAL_TABLET | Freq: Four times a day (QID) | ORAL | Status: DC | PRN
  Administered 2013-06-10: 10 mg via ORAL
  Filled 2013-06-09 (×2): qty 1

## 2013-06-09 NOTE — Progress Notes (Signed)
IP PROGRESS NOTE  Subjective:   He is completing day 3 of chemotherapy. No complaint.  Objective: Vital signs in last 24 hours: Blood pressure 126/80, pulse 77, temperature 98.4 F (36.9 C), temperature source Oral, resp. rate 18, height 5\' 11"  (1.803 m), weight 210 lb (95.255 kg), SpO2 100.00%.  Intake/Output from previous day: 11/08 0701 - 11/09 0700 In: 1788 [P.O.:600; I.V.:500; IV Piggyback:688] Out: 950 [Urine:950]  Physical Exam:  HEENT: No thrush Lungs: Slight decrease in breath sounds over the right chest, no respiratory distress Cardiac: Regular rate and rhythm Abdomen: Nontender Extremities: No leg edema   Portacath/PICC-without erythema  Lab Results:  Recent Labs  06/06/13 1313 06/09/13 0525  WBC 4.8 6.7  HGB 9.2* 8.2*  HCT 27.4* 24.0*  PLT 216 191    BMET  Recent Labs  06/08/13 0530 06/09/13 0525  NA 138 135  K 4.6 4.5  CL 108 106  CO2 21 21  GLUCOSE 183* 263*  BUN 11 14  CREATININE 0.87 0.88  CALCIUM 8.6 8.3*    Studies/Results: No results found.  Medications: I have reviewed the patient's current medications.  Assessment/Plan:  1. Multiple myeloma-currently completing cycle 3 of VD-PACE  2. History of hypercalcemia secondary to myeloma  3. 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 Hyperglycemia secondary to steroids  He appears to be tolerating the chemotherapy well. Plan to continue chemotherapy as scheduled.   LOS: 3 days   Lynore Coscia  06/09/2013, 8:54 AM

## 2013-06-09 NOTE — Progress Notes (Signed)
Patient receiving 4th of 5 days' chemo. Tubing changed, blood return noted, unit protocol followed prior to administering chemo/biotherapy starting 6:30 pm. Patient tolerated the proceedings well. Of note, patient did ask about the protocol for not having to take his Lovenox, as he has been walking fairly often in the halls. This RN informed the patient that if he wanted to refuse any medication or procedure, has that right. Pt declined Lovenox shot today.

## 2013-06-09 NOTE — Progress Notes (Signed)
On call provider notified patient is complaining of nausea during chemotherapy. New orders received. Patient informed

## 2013-06-10 LAB — BASIC METABOLIC PANEL
CO2: 22 mEq/L (ref 19–32)
Calcium: 7.6 mg/dL — ABNORMAL LOW (ref 8.4–10.5)
Chloride: 109 mEq/L (ref 96–112)
GFR calc Af Amer: >90 mL/min (ref >90)
Sodium: 139 mEq/L (ref 135–145)

## 2013-06-10 LAB — UIFE/LIGHT CHAINS/TP QN, 24-HR UR
Albumin, U: DETECTED
Alpha 1, Urine: DETECTED — AB
Alpha 1, Urine: DETECTED — AB
Alpha 2, Urine: DETECTED — AB
Alpha 2, Urine: DETECTED — AB
Beta, Urine: DETECTED — AB
Free Kappa Lt Chains,Ur: 17.9 mg/dL — ABNORMAL HIGH (ref 0.14–2.42)
Free Kappa/Lambda Ratio: 179 ratio — ABNORMAL HIGH (ref 2.04–10.37)
Free Lambda Excretion/Day: 6.6 mg/d
Free Lambda Lt Chains,Ur: 0.1 mg/dL (ref 0.02–0.67)
Free Lt Chn Excr Rate: 1181.4 mg/d
Gamma Globulin, Urine: DETECTED — AB
Time: 24 hours
Total Protein, Urine: 18.5 mg/dL
Volume, Urine: 6600 mL

## 2013-06-10 LAB — MAGNESIUM: Magnesium: 2.3 mg/dL (ref 1.5–2.5)

## 2013-06-10 LAB — GLUCOSE, CAPILLARY: Glucose-Capillary: 83 mg/dL (ref 70–99)

## 2013-06-10 MED ORDER — METOCLOPRAMIDE HCL 5 MG/ML IJ SOLN
20.0000 mg | Freq: Four times a day (QID) | INTRAVENOUS | Status: DC | PRN
  Administered 2013-06-10 – 2013-06-11 (×3): 20 mg via INTRAVENOUS
  Filled 2013-06-10 (×3): qty 4

## 2013-06-10 NOTE — Care Management Note (Signed)
   CARE MANAGEMENT NOTE 06/10/2013  Patient:  [facility]   Account Number:  [id number]  Date Initiated:  06/06/2013  Documentation initiated by:  Salimah Martinovich  Subjective/Objective Assessment:   42 yo male admitted with kappa light myeloma.     Action/Plan:   Home when stable   Anticipated DC Date:     Anticipated DC Plan:  HOME/SELF CARE      DC Planning Services  CM consult      Choice offered to / List presented to:  NA   DME arranged  NA      DME agency  NA     HH arranged  NA      HH agency  NA   Status of service:  Completed, signed off Medicare Important Message given?   (If response is "NO", the following Medicare IM given date fields will be blank) Date Medicare IM given:   Date Additional Medicare IM given:    Discharge Disposition:    Per UR Regulation:  Reviewed for med. necessity/level of care/duration of stay  If discussed at Long Length of Stay Meetings, dates discussed:    Comments:  06/10/13 1222 Gisella Alwine,RN,MSN [phone] Day 4 of VD-Pace. IV reglan. Pt's spouse present at the bedside. Pt idependently ambulating in the hallway. No barriers to dc identified at htis time.  06/06/13 1353 Rylend Pietrzak,RN,MSN [phone] Chart reviewed for utilization of services. PTA pt independent. Oncologist: Dr. Mahala Menghini. no needs or barriers to dc idnetified at this time.

## 2013-06-11 ENCOUNTER — Other Ambulatory Visit: Payer: Self-pay | Admitting: Hematology & Oncology

## 2013-06-11 ENCOUNTER — Telehealth: Payer: Self-pay | Admitting: Hematology & Oncology

## 2013-06-11 DIAGNOSIS — C9000 Multiple myeloma not having achieved remission: Secondary | ICD-10-CM

## 2013-06-11 LAB — BASIC METABOLIC PANEL
Calcium: 7.4 mg/dL — ABNORMAL LOW (ref 8.4–10.5)
Chloride: 106 mEq/L (ref 96–112)
Creatinine, Ser: 0.88 mg/dL (ref 0.50–1.35)
GFR calc Af Amer: >90 mL/min (ref >90)
GFR calc non Af Amer: >90 mL/min (ref >90)
Potassium: 3.8 mEq/L (ref 3.5–5.1)

## 2013-06-11 LAB — MAGNESIUM: Magnesium: 2 mg/dL (ref 1.5–2.5)

## 2013-06-11 MED ORDER — HEPARIN SOD (PORK) LOCK FLUSH 100 UNIT/ML IV SOLN
500.0000 [IU] | INTRAVENOUS | Status: AC | PRN
  Administered 2013-06-11: 500 [IU]

## 2013-06-11 NOTE — Discharge Summary (Signed)
Dictated

## 2013-06-11 NOTE — Telephone Encounter (Signed)
Cancel 13th and rescheduled for 12th, per Dr. Bea Laura.

## 2013-06-11 NOTE — Progress Notes (Signed)
On call provider notified by telephone patient c/o abd pain;abd somewhat firm with hypoactive bowel sounds signs and nausea continues---no new orders.

## 2013-06-11 NOTE — Discharge Summary (Signed)
#   [id number] is D/C summary.  Mark [name]  Proverbs 3:5-6

## 2013-06-11 NOTE — Progress Notes (Signed)
Pt discharged home in stable condition. Discharge instructions given. Pt verbalized understanding.  

## 2013-06-11 NOTE — Progress Notes (Signed)
Patient up out of bed ambulating in the hallway.  No complaints ,states he will be discharged today.

## 2013-06-12 ENCOUNTER — Ambulatory Visit (HOSPITAL_BASED_OUTPATIENT_CLINIC_OR_DEPARTMENT_OTHER): Payer: BC Managed Care – PPO

## 2013-06-12 ENCOUNTER — Telehealth: Payer: Self-pay | Admitting: Hematology & Oncology

## 2013-06-12 ENCOUNTER — Encounter: Payer: Self-pay | Admitting: Nurse Practitioner

## 2013-06-12 ENCOUNTER — Other Ambulatory Visit (HOSPITAL_BASED_OUTPATIENT_CLINIC_OR_DEPARTMENT_OTHER): Payer: BC Managed Care – PPO | Admitting: Lab

## 2013-06-12 VITALS — BP 140/86 | HR 98 | Temp 98.8°F | Resp 16

## 2013-06-12 DIAGNOSIS — C9000 Multiple myeloma not having achieved remission: Secondary | ICD-10-CM

## 2013-06-12 DIAGNOSIS — Z5189 Encounter for other specified aftercare: Secondary | ICD-10-CM

## 2013-06-12 LAB — CMP (CANCER CENTER ONLY)
ALT(SGPT): 11 U/L (ref 10–47)
AST: 12 U/L (ref 11–38)
Albumin: 3.7 g/dL (ref 3.3–5.5)
Alkaline Phosphatase: 78 U/L (ref 26–84)
BUN, Bld: 12 mg/dL (ref 7–22)
Calcium: 7.4 mg/dL — ABNORMAL LOW (ref 8.0–10.3)
Chloride: 105 mEq/L (ref 98–108)
Glucose, Bld: 97 mg/dL (ref 73–118)
Potassium: 3.4 mEq/L (ref 3.3–4.7)
Total Bilirubin: 0.8 mg/dl (ref 0.20–1.60)

## 2013-06-12 LAB — CBC WITH DIFFERENTIAL (CANCER CENTER ONLY)
BASO%: 0 % (ref 0.0–2.0)
EOS%: 0.7 % (ref 0.0–7.0)
HGB: 8.5 g/dL — ABNORMAL LOW (ref 13.0–17.1)
LYMPH#: 0.1 10*3/uL — ABNORMAL LOW (ref 0.9–3.3)
MCHC: 33.7 g/dL (ref 32.0–35.9)
MONO%: 2 % (ref 0.0–13.0)
NEUT#: 1.4 10*3/uL — ABNORMAL LOW (ref 1.5–6.5)
Platelets: 138 10*3/uL — ABNORMAL LOW (ref 145–400)
RDW: 14.3 % (ref 11.1–15.7)
WBC: 1.5 10*3/uL — ABNORMAL LOW (ref 4.0–10.0)

## 2013-06-12 MED ORDER — PEGFILGRASTIM INJECTION 6 MG/0.6ML
SUBCUTANEOUS | Status: AC
  Filled 2013-06-12: qty 0.6

## 2013-06-12 MED ORDER — PEGFILGRASTIM INJECTION 6 MG/0.6ML
6.0000 mg | Freq: Once | SUBCUTANEOUS | Status: AC
  Administered 2013-06-12: 6 mg via SUBCUTANEOUS

## 2013-06-12 NOTE — Telephone Encounter (Signed)
Pt has been APPROVED for the NEULASTA/NEUPOGEN FIRST STEP Program and card is now ready to use.  Your confirmation number for card is [phone] .   If you have any questions or problems using your card after registration, please call [phone]) from 9 a.m. to 8 p.m. ET, Monday - Friday.

## 2013-06-12 NOTE — Progress Notes (Signed)
Cytogenetic and Fish faxed to Dr. Imelda Pillow, RN @ [phone]. Pt is currently being seen in their office.

## 2013-06-12 NOTE — Patient Instructions (Signed)

## 2013-06-13 ENCOUNTER — Other Ambulatory Visit: Payer: Self-pay | Admitting: Lab

## 2013-06-13 ENCOUNTER — Ambulatory Visit: Payer: Self-pay

## 2013-06-14 NOTE — Discharge Summary (Signed)
Mark [name], Mark [name]                 ACCOUNT NO.:  [id number]  MEDICAL RECORD NO.:  [id number]  LOCATION:  1303                         FACILITY:  [facility]  PHYSICIAN:  [name], M.D.  DATE OF BIRTH:  [birth year]  DATE OF ADMISSION:  06/06/2013 DATE OF DISCHARGE:  06/11/2013                              DISCHARGE SUMMARY   DIAGNOSES UPON DISCHARGE: 1. Refractory kappa light chain myeloma. 2. Status post cycle #3 of VD-PACE. 3. Anemia secondary to chemotherapy/myeloma. 4. 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  CONDITION UPON DISCHARGE:  Stable.  ACTIVITIES:  As tolerated.  DIET:  Without restrictions.  FOLLOWUP: 1. The patient will come to the [facility] on     June 12, 2013, for Neulasta and lab work. 2. The patient will go to [facility] on the 12th for evaluation     for bone marrow transplant.  MEDICATIONS ON DISCHARGE: 1. Colace 200 mg p.o. b.i.d. p.r.n. 2. Famvir 250 mg p.o. daily. 3. Pepcid 20 mg p.o. b.i.d. 4. Diflucan 100 mg p.o. daily. 5. Lactulose 20 mL p.o. q.8 hours p.r.n. constipation. 6. Levaquin 500 mg p.o. daily. 7. Oxycodone 5-10 mg p.o. q.6 hours p.r.n. pain. 8. Compazine 10 mg p.o. q.6 hours p.r.n. nausea and vomiting. 9. Sodium bicarb mouth rinses 15 mL b.i.d.  HOSPITAL COURSE:  Mark [name] was admitted for his 3rd cycle of chemotherapy.  He did have a double-lumen PICC line placed without difficulties.  This is in his left arm.  Upon admission, his lab work did show some anemia.  Again, he was asymptomatic with this.  He had his Port-A-Cath accessed.  He was started on IV fluids.  Pharmacy helped out immensely with his chemotherapy dosing.  He started his chemotherapy on the 6th.  Body surface area was 2.18 m2. He received Velcade at a total dose of 2.7 mg on day #1 and day #4.  He got IV Decadron at 40 mg a day for 4 days.  His Adriamycin was continuous infusion at 22 mg a day for 4 days.  His etoposide dose was 40  mg continuous infusion daily.  He got cisplatin at a dose of 10 mg per m2 per day for 4 days and Cytoxan was 840 mg per day via continuous infusion over 4 days.  He tolerated chemotherapy pretty well.  He did have some vomiting on the 10th.  We adjusted his antiemetics a little bit.  He felt better.  He is ambulating without pain.  He is having no problems urinating.  He has had problems with constipation.  This was helped with Colace and lactulose.  On the 9th, his white cell count was 6.7, hemoglobin 8.2, and platelet count 191.  On the 11th, sodium of 136, potassium 3.8, BUN 11, creatinine 0.88.  Calcium 7.4.  We did do a 24-hour urine on him.  He had 1181 mg of kappa light chain.  This is a little unusual.  His last 24-hour urine back in October showed 242 mg per day of kappa light chain.  His serum light chain studies are down to 4.19 mg per day.  With Mark [name], it is all about his  hypercalcemia as a indicator for myeloma progression.  He was ready to be discharged on the 11th.  PHYSICAL EXAMINATION:  GENERAL:  Well-developed, well-nourished African American gentleman in no obvious distress. VITAL SIGNS:  Temperature of 99.1, pulse 107, respiratory rate 16, blood pressure 120/88. HEAD AND NECK:  Normocephalic, atraumatic skull.  There are no ocular or oral lesions.  No palpable cervical or supraclavicular lymph nodes. LUNGS:  Clear bilaterally.  There are no rales, wheezes, or rhonchi. CARDIAC:  Regular rate and rhythm with normal S1 and S2.  There are no murmurs, rubs, or bruits. ABDOMEN:  Soft.  He has good bowel sounds.  There is no fluid wave. There is no palpable hepatosplenomegaly. EXTREMITIES:  No clubbing, cyanosis, or edema. NEUROLOGICAL:  No focal neurological deficits. SKIN:  No rashes, ecchymosis, or petechiae.     [name], M.D.     PRE/MEDQ  D:  06/11/2013  T:  06/12/2013  Job:  [id number]

## 2013-06-18 ENCOUNTER — Telehealth: Payer: Self-pay | Admitting: *Deleted

## 2013-06-25 ENCOUNTER — Ambulatory Visit (HOSPITAL_BASED_OUTPATIENT_CLINIC_OR_DEPARTMENT_OTHER): Payer: BC Managed Care – PPO | Admitting: Hematology & Oncology

## 2013-06-25 ENCOUNTER — Other Ambulatory Visit (HOSPITAL_BASED_OUTPATIENT_CLINIC_OR_DEPARTMENT_OTHER): Payer: BC Managed Care – PPO | Admitting: Lab

## 2013-06-25 VITALS — BP 141/78 | HR 88 | Temp 98.9°F | Resp 18 | Ht 69.0 in | Wt 210.0 lb

## 2013-06-25 DIAGNOSIS — C9000 Multiple myeloma not having achieved remission: Secondary | ICD-10-CM

## 2013-06-25 DIAGNOSIS — D649 Anemia, unspecified: Secondary | ICD-10-CM

## 2013-06-25 LAB — CBC WITH DIFFERENTIAL (CANCER CENTER ONLY)
BASO%: 0.3 % (ref 0.0–2.0)
EOS%: 0.3 % (ref 0.0–7.0)
Eosinophils Absolute: 0 10*3/uL (ref 0.0–0.5)
HCT: 26.5 % — ABNORMAL LOW (ref 38.7–49.9)
HGB: 8.5 g/dL — ABNORMAL LOW (ref 13.0–17.1)
LYMPH#: 0.5 10*3/uL — ABNORMAL LOW (ref 0.9–3.3)
MCH: 31.8 pg (ref 28.0–33.4)
MCHC: 32.1 g/dL (ref 32.0–35.9)
MCV: 99 fL — ABNORMAL HIGH (ref 82–98)
MONO#: 0.7 10*3/uL (ref 0.1–0.9)
MONO%: 23.1 % — ABNORMAL HIGH (ref 0.0–13.0)
NEUT#: 2 10*3/uL (ref 1.5–6.5)
NEUT%: 61.3 % (ref 40.0–80.0)
RDW: 15.9 % — ABNORMAL HIGH (ref 11.1–15.7)

## 2013-06-25 LAB — COMPREHENSIVE METABOLIC PANEL
ALT: 8 U/L (ref 0–53)
AST: 14 U/L (ref 0–37)
Alkaline Phosphatase: 104 U/L (ref 39–117)
BUN: 13 mg/dL (ref 6–23)
Calcium: 8.5 mg/dL (ref 8.4–10.5)
Chloride: 106 mEq/L (ref 96–112)
Creatinine, Ser: 1.14 mg/dL (ref 0.50–1.35)
Total Protein: 6.5 g/dL (ref 6.0–8.3)

## 2013-06-25 NOTE — Progress Notes (Signed)
This office note has been dictated.

## 2013-06-28 ENCOUNTER — Other Ambulatory Visit: Payer: Self-pay

## 2013-06-28 ENCOUNTER — Inpatient Hospital Stay (HOSPITAL_COMMUNITY)
Admission: AD | Admit: 2013-06-28 | Discharge: 2013-07-03 | DRG: 847 | Disposition: A | Discharge status: Home / Self Care | Payer: BC Managed Care – PPO | Source: Ambulatory Visit | Attending: Hematology & Oncology | Admitting: Hematology & Oncology

## 2013-06-28 ENCOUNTER — Other Ambulatory Visit: Payer: Self-pay | Admitting: Hematology & Oncology

## 2013-06-28 ENCOUNTER — Inpatient Hospital Stay (HOSPITAL_COMMUNITY): Payer: BC Managed Care – PPO

## 2013-06-28 ENCOUNTER — Encounter (HOSPITAL_COMMUNITY): Payer: Self-pay | Admitting: *Deleted

## 2013-06-28 DIAGNOSIS — R112 Nausea with vomiting, unspecified: Secondary | ICD-10-CM

## 2013-06-28 DIAGNOSIS — C9002 Multiple myeloma in relapse: Secondary | ICD-10-CM

## 2013-06-28 DIAGNOSIS — M898X9 Other specified disorders of bone, unspecified site: Secondary | ICD-10-CM

## 2013-06-28 DIAGNOSIS — Z5111 Encounter for antineoplastic chemotherapy: Principal | ICD-10-CM

## 2013-06-28 DIAGNOSIS — M899 Disorder of bone, unspecified: Secondary | ICD-10-CM

## 2013-06-28 DIAGNOSIS — N179 Acute kidney failure, unspecified: Secondary | ICD-10-CM

## 2013-06-28 DIAGNOSIS — R0989 Other specified symptoms and signs involving the circulatory and respiratory systems: Secondary | ICD-10-CM | POA: Diagnosis not present

## 2013-06-28 DIAGNOSIS — K5900 Constipation, unspecified: Secondary | ICD-10-CM

## 2013-06-28 DIAGNOSIS — D6481 Anemia due to antineoplastic chemotherapy: Secondary | ICD-10-CM | POA: Diagnosis not present

## 2013-06-28 DIAGNOSIS — R110 Nausea: Secondary | ICD-10-CM | POA: Diagnosis not present

## 2013-06-28 DIAGNOSIS — R5381 Other malaise: Secondary | ICD-10-CM | POA: Diagnosis present

## 2013-06-28 DIAGNOSIS — R0609 Other forms of dyspnea: Secondary | ICD-10-CM | POA: Diagnosis not present

## 2013-06-28 DIAGNOSIS — R142 Eructation: Secondary | ICD-10-CM | POA: Diagnosis present

## 2013-06-28 DIAGNOSIS — Z6831 Body mass index (BMI) 31.0-31.9, adult: Secondary | ICD-10-CM

## 2013-06-28 DIAGNOSIS — E860 Dehydration: Secondary | ICD-10-CM

## 2013-06-28 DIAGNOSIS — R141 Gas pain: Secondary | ICD-10-CM | POA: Diagnosis present

## 2013-06-28 DIAGNOSIS — M549 Dorsalgia, unspecified: Secondary | ICD-10-CM

## 2013-06-28 DIAGNOSIS — M8448XA Pathological fracture, other site, initial encounter for fracture: Secondary | ICD-10-CM | POA: Diagnosis present

## 2013-06-28 DIAGNOSIS — N182 Chronic kidney disease, stage 2 (mild): Secondary | ICD-10-CM

## 2013-06-28 DIAGNOSIS — R51 Headache: Secondary | ICD-10-CM

## 2013-06-28 DIAGNOSIS — E875 Hyperkalemia: Secondary | ICD-10-CM | POA: Diagnosis present

## 2013-06-28 DIAGNOSIS — D649 Anemia, unspecified: Secondary | ICD-10-CM

## 2013-06-28 DIAGNOSIS — T451X5A Adverse effect of antineoplastic and immunosuppressive drugs, initial encounter: Secondary | ICD-10-CM | POA: Diagnosis not present

## 2013-06-28 DIAGNOSIS — E876 Hypokalemia: Secondary | ICD-10-CM | POA: Diagnosis not present

## 2013-06-28 DIAGNOSIS — C9000 Multiple myeloma not having achieved remission: Secondary | ICD-10-CM | POA: Diagnosis present

## 2013-06-28 LAB — CBC
HCT: 23.3 % — ABNORMAL LOW (ref 39.0–52.0)
Hemoglobin: 7.6 g/dL — ABNORMAL LOW (ref 13.0–17.0)
MCH: 31.4 pg (ref 26.0–34.0)
MCHC: 32.6 g/dL (ref 30.0–36.0)
MCV: 96.3 fL (ref 78.0–100.0)
Platelets: 101 10*3/uL — ABNORMAL LOW (ref 150–400)
RBC: 2.42 MIL/uL — ABNORMAL LOW (ref 4.22–5.81)
RDW: 17.2 % — ABNORMAL HIGH (ref 11.5–15.5)
WBC: 2.6 10*3/uL — ABNORMAL LOW (ref 4.0–10.5)

## 2013-06-28 LAB — COMPREHENSIVE METABOLIC PANEL
AST: 14 U/L (ref 0–37)
Alkaline Phosphatase: 99 U/L (ref 39–117)
BUN: 21 mg/dL (ref 6–23)
CO2: 24 mEq/L (ref 19–32)
Calcium: 9.2 mg/dL (ref 8.4–10.5)
Creatinine, Ser: 1.44 mg/dL — ABNORMAL HIGH (ref 0.50–1.35)
GFR calc non Af Amer: 59 mL/min — ABNORMAL LOW (ref >90)

## 2013-06-28 MED ORDER — FAMCICLOVIR 500 MG PO TABS
250.0000 mg | ORAL_TABLET | Freq: Every day | ORAL | Status: DC
  Administered 2013-06-28 – 2013-07-02 (×5): 250 mg via ORAL
  Filled 2013-06-28 (×6): qty 0.5

## 2013-06-28 MED ORDER — BIOTENE DRY MOUTH MT LIQD: 15.0000 mL | OROMUCOSAL | Status: DC | PRN

## 2013-06-28 MED ORDER — BORTEZOMIB CHEMO IV INJECTION 3.5 MG
1.3000 mg/m2 | INTRAMUSCULAR | Status: AC
  Administered 2013-06-28 – 2013-07-01 (×2): 2.7 mg via INTRAVENOUS
  Filled 2013-06-28 (×2): qty 2.7

## 2013-06-28 MED ORDER — SODIUM CHLORIDE 0.9 % IV SOLN
INTRAVENOUS | Status: DC
  Administered 2013-06-28: 11:00:00 via INTRAVENOUS
  Administered 2013-06-28: 1000 mL via INTRAVENOUS
  Administered 2013-06-29 – 2013-07-02 (×4): via INTRAVENOUS

## 2013-06-28 MED ORDER — ZOLEDRONIC ACID 4 MG/5ML IV CONC
4.0000 mg | Freq: Once | INTRAVENOUS | Status: AC
  Administered 2013-06-28: 4 mg via INTRAVENOUS
  Filled 2013-06-28: qty 5

## 2013-06-28 MED ORDER — BISACODYL 5 MG PO TBEC
10.0000 mg | DELAYED_RELEASE_TABLET | Freq: Two times a day (BID) | ORAL | Status: DC | PRN
  Administered 2013-06-28 – 2013-07-01 (×5): 10 mg via ORAL
  Filled 2013-06-28 (×5): qty 2

## 2013-06-28 MED ORDER — HEPARIN SOD (PORK) LOCK FLUSH 100 UNIT/ML IV SOLN: 250.0000 [IU] | Freq: Once | INTRAVENOUS | Status: AC | PRN

## 2013-06-28 MED ORDER — DOCUSATE SODIUM 100 MG PO CAPS
200.0000 mg | ORAL_CAPSULE | Freq: Two times a day (BID) | ORAL | Status: DC
  Administered 2013-06-28: 200 mg via ORAL
  Filled 2013-06-28 (×2): qty 2

## 2013-06-28 MED ORDER — COLD PACK MISC ONCOLOGY
1.0000 | Freq: Once | Status: AC | PRN
  Filled 2013-06-28: qty 1

## 2013-06-28 MED ORDER — HOT PACK MISC ONCOLOGY
1.0000 | Freq: Once | Status: AC | PRN
  Filled 2013-06-28: qty 1

## 2013-06-28 MED ORDER — POTASSIUM CHLORIDE 2 MEQ/ML IV SOLN
INTRAVENOUS | Status: DC
  Administered 2013-06-28: 16:00:00 via INTRAVENOUS
  Filled 2013-06-28 (×2): qty 10

## 2013-06-28 MED ORDER — PALONOSETRON HCL INJECTION 0.25 MG/5ML
0.2500 mg | INTRAVENOUS | Status: AC
  Administered 2013-06-28 – 2013-06-30 (×2): 0.25 mg via INTRAVENOUS
  Filled 2013-06-28 (×2): qty 5

## 2013-06-28 MED ORDER — SODIUM CHLORIDE 0.9 % IJ SOLN: 10.0000 mL | INTRAMUSCULAR | Status: DC | PRN

## 2013-06-28 MED ORDER — OXYCODONE HCL 5 MG PO TABS
5.0000 mg | ORAL_TABLET | Freq: Four times a day (QID) | ORAL | Status: DC | PRN
  Administered 2013-06-28: 5 mg via ORAL
  Filled 2013-06-28: qty 1

## 2013-06-28 MED ORDER — SODIUM CHLORIDE 0.9 % IV SOLN
150.0000 mg | Freq: Once | INTRAVENOUS | Status: AC
  Administered 2013-06-28: 150 mg via INTRAVENOUS
  Filled 2013-06-28: qty 5

## 2013-06-28 MED ORDER — FAMOTIDINE 20 MG PO TABS
20.0000 mg | ORAL_TABLET | Freq: Two times a day (BID) | ORAL | Status: DC
  Administered 2013-06-28 – 2013-07-02 (×10): 20 mg via ORAL
  Filled 2013-06-28 (×12): qty 1

## 2013-06-28 MED ORDER — SODIUM CHLORIDE 0.9 % IJ SOLN
10.0000 mL | Freq: Two times a day (BID) | INTRAMUSCULAR | Status: DC
  Administered 2013-06-29 – 2013-06-30 (×2): 10 mL
  Administered 2013-07-02: 20 mL
  Administered 2013-07-02: 10 mL

## 2013-06-28 MED ORDER — SODIUM CHLORIDE 0.9 % IV SOLN
INTRAVENOUS | Status: AC
  Administered 2013-06-28 – 2013-07-01 (×4): 21 mg via INTRAVENOUS
  Filled 2013-06-28 (×5): qty 21

## 2013-06-28 MED ORDER — DEXAMETHASONE 6 MG PO TABS
40.0000 mg | ORAL_TABLET | ORAL | Status: AC
  Administered 2013-06-28 – 2013-07-01 (×4): 40 mg via ORAL
  Filled 2013-06-28 (×4): qty 1

## 2013-06-28 MED ORDER — SODIUM CHLORIDE 0.9 % IV SOLN
10.0000 mg/m2 | INTRAVENOUS | Status: AC
  Administered 2013-06-28 – 2013-07-01 (×4): 22 mg via INTRAVENOUS
  Filled 2013-06-28 (×5): qty 11

## 2013-06-28 MED ORDER — SODIUM CHLORIDE 0.9 % IV SOLN: INTRAVENOUS | Status: DC

## 2013-06-28 MED ORDER — ENOXAPARIN SODIUM 40 MG/0.4ML ~~LOC~~ SOLN
40.0000 mg | SUBCUTANEOUS | Status: DC
  Administered 2013-06-28: 40 mg via SUBCUTANEOUS
  Filled 2013-06-28 (×6): qty 0.4

## 2013-06-28 MED ORDER — ALTEPLASE 2 MG IJ SOLR
2.0000 mg | Freq: Once | INTRAMUSCULAR | Status: AC | PRN
  Filled 2013-06-28: qty 2

## 2013-06-28 MED ORDER — SODIUM CHLORIDE 0.9 % IJ SOLN: 3.0000 mL | INTRAMUSCULAR | Status: DC | PRN

## 2013-06-28 MED ORDER — SODIUM BICARBONATE/SODIUM CHLORIDE MOUTHWASH
OROMUCOSAL | Status: DC
  Administered 2013-06-28 – 2013-07-02 (×17): via OROMUCOSAL
  Filled 2013-06-28: qty 1000

## 2013-06-28 MED ORDER — HEPARIN SOD (PORK) LOCK FLUSH 100 UNIT/ML IV SOLN: 500.0000 [IU] | Freq: Once | INTRAVENOUS | Status: AC | PRN

## 2013-06-28 NOTE — H&P (Signed)
#   [id number] is the admit note.  Cindee Lame e

## 2013-06-28 NOTE — Progress Notes (Signed)
Peripherally Inserted Central Catheter/Midline Placement  The IV Nurse has discussed with the patient and/or persons authorized to consent for the patient, the purpose of this procedure and the potential benefits and risks involved with this procedure.  The benefits include less needle sticks, lab draws from the catheter and patient may be discharged home with the catheter.  Risks include, but not limited to, infection, bleeding, blood clot (thrombus formation), and puncture of an artery; nerve damage and irregular heat beat.  Alternatives to this procedure were also discussed.  PICC/Midline Placement Documentation        Mark [name] 06/28/2013, 1:52 PM

## 2013-06-29 DIAGNOSIS — R51 Headache: Secondary | ICD-10-CM

## 2013-06-29 DIAGNOSIS — R141 Gas pain: Secondary | ICD-10-CM

## 2013-06-29 DIAGNOSIS — E875 Hyperkalemia: Secondary | ICD-10-CM

## 2013-06-29 LAB — CBC
HCT: 22.5 % — ABNORMAL LOW (ref 39.0–52.0)
MCH: 32.1 pg (ref 26.0–34.0)
MCV: 94.9 fL (ref 78.0–100.0)
Platelets: 122 10*3/uL — ABNORMAL LOW (ref 150–400)
RBC: 2.37 MIL/uL — ABNORMAL LOW (ref 4.22–5.81)
RDW: 17.1 % — ABNORMAL HIGH (ref 11.5–15.5)
WBC: 4.6 10*3/uL (ref 4.0–10.5)

## 2013-06-29 LAB — BASIC METABOLIC PANEL
CO2: 22 mEq/L (ref 19–32)
Calcium: 9.3 mg/dL (ref 8.4–10.5)
Chloride: 104 mEq/L (ref 96–112)
Creatinine, Ser: 1.12 mg/dL (ref 0.50–1.35)
GFR calc Af Amer: >90 mL/min (ref >90)
Glucose, Bld: 166 mg/dL — ABNORMAL HIGH (ref 70–99)
Potassium: 5.3 mEq/L — ABNORMAL HIGH (ref 3.5–5.1)
Sodium: 136 mEq/L (ref 135–145)

## 2013-06-29 LAB — MAGNESIUM: Magnesium: 1.9 mg/dL (ref 1.5–2.5)

## 2013-06-29 LAB — ABO/RH: ABO/RH(D): B POS

## 2013-06-29 MED ORDER — SODIUM CHLORIDE 0.45 % IV SOLN
INTRAVENOUS | Status: DC
  Administered 2013-06-29: 10:00:00 via INTRAVENOUS
  Filled 2013-06-29 (×2): qty 1000

## 2013-06-29 NOTE — Progress Notes (Signed)
Mark [name]   DOB:Jan 13, 1971   WU#:[id number]   GNF#:[id number]  Subjective: admitted very early yesterday after a big Thanksgivings, still feeling a little "bloated," but ambulating in halls; detailed ROS otherwise negative   Objective: young-appearing African American man examined sitting in recliner Filed Vitals:   06/29/13 0517  BP: 114/74  Pulse: 80  Temp: 98 F (36.7 C)  Resp: 18    Body mass index is 31.44 kg/(m^2).  Intake/Output Summary (Last 24 hours) at 06/29/13 0906 Last data filed at 06/29/13 0610  Gross per 24 hour  Intake 4964.39 ml  Output   2375 ml  Net 2589.39 ml     Sclerae unicteric  Oropharynx clear  No cervical or supraclavicular adenopathy  Lungs clear -- no rales or rhonchi  Heart regular rate and rhythm  Abdomen +BS  MSK no peripheral edema  Neuro nonfocal, positive affect  Skin: PICC intact    CBG (last 3)  No results found for this basename: GLUCAP,  in the last 72 hours   Labs:  Results for Mark, [name] (MRN [id number]) as of 06/29/2013 09:06  Ref. Range 03/08/2013 10:26 03/21/2013 11:25 04/15/2013 08:25 05/06/2013 07:56 06/03/2013 08:41  Kappa free light chain Latest Range: 0.33-1.94 mg/dL 84.13 (H) 24.40 (H) 10.27 (H) 10.70 (H) 4.19 (H)    Lab Results  Component Value Date   WBC 4.6 06/29/2013   HGB 7.6* 06/29/2013   HCT 22.5* 06/29/2013   MCV 94.9 06/29/2013   PLT 122* 06/29/2013   NEUTROABS 2.0 06/25/2013    @LASTCHEMISTRY @  Urine Studies No results found for this basename: UACOL, UAPR, USPG, UPH, UTP, UGL, UKET, UBIL, UHGB, UNIT, UROB, ULEU, UEPI, UWBC, URBC, UBAC, CAST, CRYS, UCOM, BILUA,  in the last 72 hours  Basic Metabolic Panel:  Recent Labs Lab 06/25/13 1153 06/28/13 1120 06/29/13 0610  NA 141 141 136  K 4.4 4.4 5.3*  CL 106 106 104  CO2 26 24 22   GLUCOSE 75 125* 166*  BUN 13 21 16   CREATININE 1.14 1.44* 1.12  CALCIUM 8.5 9.2 9.3  MG  --   --  1.9   GFR Estimated Creatinine Clearance: 99.6 ml/min (by C-G  formula based on Cr of 1.12). Liver Function Tests:  Recent Labs Lab 06/25/13 1153 06/28/13 1120  AST 14 14  ALT 8 12  ALKPHOS 104 99  BILITOT 0.5 0.3  PROT 6.5 6.3  ALBUMIN 4.7 3.9   No results found for this basename: LIPASE, AMYLASE,  in the last 168 hours No results found for this basename: AMMONIA,  in the last 168 hours Coagulation profile No results found for this basename: INR, PROTIME,  in the last 168 hours  CBC:  Recent Labs Lab 06/25/13 1153 06/28/13 1120 06/29/13 0610  WBC 3.2* 2.6* 4.6  NEUTROABS 2.0  --   --   HGB 8.5* 7.6* 7.6*  HCT 26.5* 23.3* 22.5*  MCV 99* 96.3 94.9  PLT 83* 101* 122*   Cardiac Enzymes: No results found for this basename: CKTOTAL, CKMB, CKMBINDEX, TROPONINI,  in the last 168 hours BNP: No components found with this basename: POCBNP,  CBG: No results found for this basename: GLUCAP,  in the last 168 hours D-Dimer No results found for this basename: DDIMER,  in the last 72 hours Hgb A1c No results found for this basename: HGBA1C,  in the last 72 hours Lipid Profile No results found for this basename: CHOL, HDL, LDLCALC, TRIG, CHOLHDL, LDLDIRECT,  in the last 72 hours Thyroid function studies  No results found for this basename: TSH, T4TOTAL, FREET3, T3FREE, THYROIDAB,  in the last 72 hours Anemia work up No results found for this basename: VITAMINB12, FOLATE, FERRITIN, TIBC, IRON, RETICCTPCT,  in the last 72 hours Microbiology No results found for this or any previous visit (from the past 240 hour(s)).    Studies:  X-ray Chest Pa And Lateral   06/28/2013   CLINICAL DATA:  Patient is complaining of lower back pain and has a history of multiple myeloma  EXAM: CHEST  2 VIEW  COMPARISON:  Chest x-ray dated June 06, 2013.  FINDINGS: The the lungs are adequately inflated. There is no focal infiltrate. A PICC line is in place on the left with the tip in the region of the midportion of the SVC. The Port-A-Cath appliance on the right  is unchanged in appearance with its tip in the midportion of the SVC. The cardiac silhouette is normal in size. The pulmonary vascularity is not engorged. There is minimal linear scarring in the left lateral costophrenic gutter. There is no pleural effusion.  The thoracic vertebral bodies are preserved in height. The observed portions of the ribs exhibit no acute abnormalities. No definite sternal lesion is demonstrated.  IMPRESSION: There is no evidence of acute cardiopulmonary abnormality. No definite abnormality of the bony structures is demonstrated either.   Electronically Signed   By: David  [place]   On: 06/28/2013 15:23    Assessment: 42 y.o. [place] man with refractory kappa light chain myeloma, responding to current tretment, transplant planned for early 2015  (1) anemia, asymptomatic, not requiring transfusion so far  (2) hypercalcemia: controlled, s/p zolendronate August and pamidronate October 2014  (3) hyperkalemia-- will remove K+ from IVF, continue Mg++ and NS as ordered    Plan: He is currently day 2 cycle 4 bortezomib/ dexamethasone/ cisplatin/ cyclophosphamide/ etoposide/ doxorubicin (VD-PACE) with evidence of response. Discussed with patient, and gave him a copy of his kappa results. He is tolerating chemo well; potassium removed from premeds today and will recheck in AM. Discussed anemia and alerted him re symptoms that would move [place] towards transfusion--none needed today.  Lowella Dell, MD 06/29/2013  9:06 AM

## 2013-06-29 NOTE — H&P (Signed)
NAMECALOGERO, [name]                 ACCOUNT NO.:  [id number]  MEDICAL RECORD NO.:  [id number]  LOCATION:  1316                         FACILITY:  [facility]  PHYSICIAN:  [name], M.D.  DATE OF BIRTH:  12-30-1970  DATE OF ADMISSION:  06/28/2013 DATE OF DISCHARGE:                             HISTORY & PHYSICAL   REASON FOR ADMISSION:  Cycle #4 of chemotherapy with VD-PACE for refractory kappa light chain myeloma.  HISTORY OF PRESENT ILLNESS:  Mark [name] is a very nice 42 year old African American gentleman.  Mark [name] has light chain myeloma.  Mark [name] finally responded to high-dose chemotherapy with VD-PACE.  His light chain level went down to I think 4.3 mg/dL.  Mark [name] is being considered for stem cell transplant at [facility].  Mark [name] went there last week.  They will transplant him.  This probably will be done in January.  Mark [name] has been admitted for his 4th cycle of chemotherapy.  So far, Mark [name] has tolerated chemotherapy pretty well.  With each cycle, however, Mark [name] has had a little more in the way of nausea.  Mark [name] does feel a bit fatigued right now.  Mark [name] has a little bit of abdominal distention.  Mark [name] has little bit of nausea.  Mark [name] has had no diarrhea.  Mark [name] has had no cough.  Mark [name] has had no fever.  Mark [name] has had no rashes.  His bony pain, in which Mark [name] presented with myeloma, is much better.  PAST MEDICAL HISTORY:  Pretty much unremarkable outside of that associated with myeloma.  ALLERGIES:  None.  ADMISSION MEDICATIONS: 1. Famvir 250 mg p.o. daily. 2. Pepcid 20 mg p.o. b.i.d. 3. Diflucan 100 mg p.o. daily. 4. Levaquin 500 mg p.o. daily. 5. Compazine 10 mg p.o. q.6 hours p.r.n. 6. Oxycodone 5-10 mg p.o. q.6 hours p.r.n.  SOCIAL HISTORY:  Negative for tobacco use.  There is no alcohol use. There is no obvious occupational exposures.  FAMILY HISTORY:  Noncontributory.  REVIEW OF SYSTEMS:  As stated in the history of present illness.  PHYSICAL EXAMINATION:  GENERAL:  This is a well-developed,  well- nourished African American gentleman in no obvious distress.  Mark [name] is alert and oriented x3. VITAL SIGNS:  Temperature of 98.8, pulse 84, respiratory rate 18, blood pressure 120/66, weight 213 pounds. HEAD AND NECK:  Normocephalic, atraumatic skull.  Mark [name] has no ocular or oral lesions.  There is no scleral icterus.  There is no mucositis.  Mark [name] has no adenopathy in the neck. LUNGS:  Clear bilaterally.  Mark [name] has no rales, wheezes, or rhonchi. CARDIAC:  Regular rate and rhythm with a normal S1, S2.  There are no murmurs, rubs, or bruits. ABDOMEN:  Soft.  There may be some slight distention.  Bowel sounds are active.  There is no guarding or rebound tenderness.  There is no palpable hepatosplenomegaly. BACK:  No tenderness over the spine, ribs, or hips. EXTREMITIES:  No clubbing, cyanosis, or edema.  Mark [name] has good strength in his legs.  Mark [name] has good pulses in his distal extremities. NEUROLOGICAL:  No focal neurological deficits. SKIN:  No rashes, ecchymosis, or petechia.  LABORATORY STUDIES:  White cell count 2.6, hemoglobin 7.6, hematocrit 23.3,  platelet count 101.  Sodium 148, potassium 4.4, BUN 21, creatinine 1.44.  Calcium 9.2 with an albumin of 3.3.  Liver function tests were normal.  Chest x-ray is negative for any infiltrates or effusions.  EKG is normal sinus rhythm.  IMPRESSION:  Mark [name] is a 42 year old gentleman with refractory kappa light chain myeloma.  Mark [name] is fine and responding to dose intense chemotherapy with VD-PACE.  Again, Mark [name] is ultimately going to go for stem cell transplant.  We will go ahead and get a double-lumen PICC line in him.  This is necessary for the infusional chemotherapy.  I always check a 24-hour urine on him.  We will get this set up.  We will give him Zometa while in the hospital.  This typically helps him.  We will make sure we are aggressive with antiemetics.  I saw that his renal function is up a little bit.  Hopefully, the IV fluids will  help with this.  I do want to get a CT scan on him for re- evaluation of his bony lesions.  However, I want to make sure that his kidney function improves a little bit before we do this.  I expect Mark [name] to be in the hospital for another 4-5 days.  Once Mark [name] is discharged, we will go ahead and give him Neulasta in the office.     [name], M.D.     PRE/MEDQ  D:  06/28/2013  T:  06/29/2013  Job:  [id number]

## 2013-06-30 DIAGNOSIS — R0609 Other forms of dyspnea: Secondary | ICD-10-CM

## 2013-06-30 LAB — CBC WITH DIFFERENTIAL/PLATELET
Basophils Absolute: 0 10*3/uL (ref 0.0–0.1)
Basophils Relative: 0 % (ref 0–1)
Eosinophils Relative: 0 % (ref 0–5)
HCT: 22.7 % — ABNORMAL LOW (ref 39.0–52.0)
Lymphocytes Relative: 3 % — ABNORMAL LOW (ref 12–46)
Lymphs Abs: 0.2 10*3/uL — ABNORMAL LOW (ref 0.7–4.0)
Monocytes Relative: 8 % (ref 3–12)
Neutro Abs: 6 10*3/uL (ref 1.7–7.7)
Neutrophils Relative %: 89 % — ABNORMAL HIGH (ref 43–77)
Platelets: 112 10*3/uL — ABNORMAL LOW (ref 150–400)
RBC: 2.37 MIL/uL — ABNORMAL LOW (ref 4.22–5.81)
RDW: 17.8 % — ABNORMAL HIGH (ref 11.5–15.5)
WBC: 6.7 10*3/uL (ref 4.0–10.5)

## 2013-06-30 LAB — BASIC METABOLIC PANEL
CO2: 20 mEq/L (ref 19–32)
Calcium: 8.3 mg/dL — ABNORMAL LOW (ref 8.4–10.5)
Sodium: 138 mEq/L (ref 135–145)

## 2013-06-30 LAB — MAGNESIUM: Magnesium: 2.2 mg/dL (ref 1.5–2.5)

## 2013-06-30 MED ORDER — MAGNESIUM SULFATE 50 % IJ SOLN
INTRAVENOUS | Status: DC
  Administered 2013-06-30 – 2013-07-01 (×2): via INTRAVENOUS
  Filled 2013-06-30 (×4): qty 1000

## 2013-06-30 NOTE — Progress Notes (Signed)
Dr.Chism notified re; results of patient's Hgb level.no new order.- Hulda Marin RN

## 2013-06-30 NOTE — Progress Notes (Signed)
Pharmacy: Brief Chemo Note  Pharmacy helping manage MIVF while patient receiving chemo   Patient Initially started on D5 1/2 NS with 20 mEq K and 12 mEq magnesium at 42 ml/hr.  Potassium trended up yesterday and was removed from MIVF.  Patient also on NS at 100 ml/hr per MD  Potassium:  11/29 Potassium was removed from MIVF secondary to K elevated to 5.3.  Today on 11/30 K improved to 4.6.  Will continue to run MIVF without potassium.   Magnesium  Mangesium today trending up to 2.2 (1.9) - will reduce magnesium to avoid trending up further Sodium  WNL at 138  Plan for 11/30 1.) Continue D#3 of Cycle 4 VD-Pace per MD 2.) Reduce Magnesium in MIFV - Change MIVF to D5 1/2 NS with 6 mEq magnesium.  Continue to hold potassium 3.) Continue NS at 100 ml/hr per MD orders - Per Magrinat on 11/29 continue this MIVF as well.   Clydene Fake PharmD Pager #: [phone] 12:22 PM 06/30/2013

## 2013-06-30 NOTE — Progress Notes (Signed)
Mark [name]   DOB:[birth year]   WU#:[id number]   GNF#:[id number]  Subjective: Patient report mild nausea yesterday that resolved.  He reports ambulating the hallways at least 3 times daily without difficulty.  He denies palpitations, chest discomfort, fevers or chills.    Objective:  Filed Vitals:   06/30/13 0530  BP: 113/80  Pulse: 94  Temp: 98.6 F (37 C)  Resp: 18    Body mass index is 31.44 kg/(m^2).  Intake/Output Summary (Last 24 hours) at 06/30/13 0901 Last data filed at 06/30/13 8469  Gross per 24 hour  Intake 6052.4 ml  Output   2400 ml  Net 3652.4 ml    Well developed, well nourished, sitting in recliner  + L PICC Line  Sclerae unicteric  Oropharynx clear  Lungs clear -- no rales or rhonchi  Heart regular rate and rhythm  Abdomen mild distension, NT, +BS  MSK no peripheral edema  Neuro nonfocal  Labs:  Lab Results  Component Value Date   WBC 4.6 06/29/2013   HGB 7.6* 06/29/2013   HCT 22.5* 06/29/2013   MCV 94.9 06/29/2013   PLT 122* 06/29/2013   NEUTROABS 2.0 06/25/2013   Basic Metabolic Panel:  Recent Labs Lab 06/25/13 1153 06/28/13 1120 06/29/13 0610 06/30/13 0520  NA 141 141 136 138  K 4.4 4.4 5.3* 4.6  CL 106 106 104 107  CO2 26 24 22 20   GLUCOSE 75 125* 166* 213*  BUN 13 21 16 17   CREATININE 1.14 1.44* 1.12 1.08  CALCIUM 8.5 9.2 9.3 8.3*  MG  --   --  1.9 2.2   GFR Estimated Creatinine Clearance: 103.3 ml/min (by C-G formula based on Cr of 1.08). Liver Function Tests:  Recent Labs Lab 06/25/13 1153 06/28/13 1120  AST 14 14  ALT 8 12  ALKPHOS 104 99  BILITOT 0.5 0.3  PROT 6.5 6.3  ALBUMIN 4.7 3.9    CBC:  Recent Labs Lab 06/25/13 1153 06/28/13 1120 06/29/13 0610  WBC 3.2* 2.6* 4.6  NEUTROABS 2.0  --   --   HGB 8.5* 7.6* 7.6*  HCT 26.5* 23.3* 22.5*  MCV 99* 96.3 94.9  PLT 83* 101* 122*   Studies:  X-ray Chest Pa And Lateral   06/28/2013   CLINICAL DATA:  Patient is complaining of lower back pain and has a history of  multiple myeloma  EXAM: CHEST  2 VIEW  COMPARISON:  Chest x-ray dated June 06, 2013.  FINDINGS: The the lungs are adequately inflated. There is no focal infiltrate. A PICC line is in place on the left with the tip in the region of the midportion of the SVC. The Port-A-Cath appliance on the right is unchanged in appearance with its tip in the midportion of the SVC. The cardiac silhouette is normal in size. The pulmonary vascularity is not engorged. There is minimal linear scarring in the left lateral costophrenic gutter. There is no pleural effusion.  The thoracic vertebral bodies are preserved in height. The observed portions of the ribs exhibit no acute abnormalities. No definite sternal lesion is demonstrated.  IMPRESSION: There is no evidence of acute cardiopulmonary abnormality. No definite abnormality of the bony structures is demonstrated either.   Electronically Signed   By: Lera Gaines  [place]   On: 06/28/2013 15:23    Assessment: 42 y.o. Mark [name] man with refractory kappa light chain myeloma, responding to current tretment, transplant planned for early 2015   (1) anemia, asymptomatic, not requiring transfusion so far  (2) hypercalcemia: controlled, s/p  zolendronate August and pamidronate October 2014. Calcium 8.3 today.  (3) hyperkalemia, resolved. With removal of K+ from IVF, continue Mg++ and NS as ordered   Plan: He is currently day 3 cycle 4 bortezomib/ dexamethasone/ cisplatin/ cyclophosphamide/ etoposide/ doxorubicin (VD-PACE) with evidence of response.  He is tolerating chemo well. Check CBC and CMP, Mag tomorrow. Had mild dyspnea that resolved yesterday on his third walk.  Will add CBC this morning.   [name], MD 06/30/2013  9:01 AM

## 2013-07-01 ENCOUNTER — Inpatient Hospital Stay (HOSPITAL_COMMUNITY): Payer: BC Managed Care – PPO

## 2013-07-01 DIAGNOSIS — R110 Nausea: Secondary | ICD-10-CM

## 2013-07-01 LAB — CBC WITH DIFFERENTIAL/PLATELET
Basophils Relative: 0 % (ref 0–1)
Eosinophils Absolute: 0 10*3/uL (ref 0.0–0.7)
Eosinophils Relative: 0 % (ref 0–5)
HCT: 21.1 % — ABNORMAL LOW (ref 39.0–52.0)
Hemoglobin: 7.2 g/dL — ABNORMAL LOW (ref 13.0–17.0)
MCH: 32.4 pg (ref 26.0–34.0)
MCHC: 34.1 g/dL (ref 30.0–36.0)
Monocytes Absolute: 0.4 10*3/uL (ref 0.1–1.0)
Monocytes Relative: 7 % (ref 3–12)
Neutrophils Relative %: 92 % — ABNORMAL HIGH (ref 43–77)
Platelets: 91 10*3/uL — ABNORMAL LOW (ref 150–400)
RDW: 17.6 % — ABNORMAL HIGH (ref 11.5–15.5)

## 2013-07-01 LAB — BASIC METABOLIC PANEL
Chloride: 108 mEq/L (ref 96–112)
GFR calc Af Amer: >90 mL/min (ref >90)
GFR calc non Af Amer: >90 mL/min (ref >90)
Glucose, Bld: 336 mg/dL — ABNORMAL HIGH (ref 70–99)
Potassium: 4.3 mEq/L (ref 3.5–5.1)
Sodium: 137 mEq/L (ref 135–145)

## 2013-07-01 LAB — PREPARE RBC (CROSSMATCH)

## 2013-07-01 LAB — GLUCOSE, CAPILLARY: Glucose-Capillary: 257 mg/dL — ABNORMAL HIGH (ref 70–99)

## 2013-07-01 MED ORDER — INSULIN ASPART 100 UNIT/ML ~~LOC~~ SOLN: 0.0000 [IU] | Freq: Three times a day (TID) | SUBCUTANEOUS | Status: DC

## 2013-07-01 MED ORDER — INSULIN ASPART 100 UNIT/ML ~~LOC~~ SOLN
0.0000 [IU] | Freq: Every day | SUBCUTANEOUS | Status: DC
  Administered 2013-07-01: 3 [IU] via SUBCUTANEOUS

## 2013-07-01 MED ORDER — METOCLOPRAMIDE HCL 5 MG/ML IJ SOLN
10.0000 mg | Freq: Four times a day (QID) | INTRAMUSCULAR | Status: DC | PRN
  Administered 2013-07-01 – 2013-07-02 (×3): 10 mg via INTRAVENOUS
  Filled 2013-07-01 (×3): qty 2

## 2013-07-01 MED ORDER — FUROSEMIDE 10 MG/ML IJ SOLN
20.0000 mg | Freq: Once | INTRAMUSCULAR | Status: AC
  Administered 2013-07-01: 20 mg via INTRAVENOUS
  Filled 2013-07-01: qty 2

## 2013-07-01 NOTE — Progress Notes (Signed)
Mark [name] did well over the weekend. His nausea to get better. He is out of bed. He does walk around makes the nausea improved.  He's had no problems with constipation. There's been no pain issues. He's had no cough or shortness of breath.  His renal function improved nicely with IV fluids. We'll see about a followup CT scan tomorrow.  His hemoglobin is 7.2. I really think we will have to transfuse him. I believe this will make you feel better, particularly since wife is having surgery on Friday.  He's had no mouth sores. He's had no rashes. He's had no leg swelling.  His vital signs are stable. Temperature 98.4. Blood pressure 130/86. Pulse is 97. Lungs are clear. He has good breath sounds by laterally. Cardiac exam regular rate and rhythm with a 1/6 systolic ejection murmur. Abdomen is soft. Has good bowel sounds. There is no fluid wave. Is no palpable liver or spleen. Extremities shows no clubbing cyanosis or edema. Has good strength. Neurological exam is nonfocal.  BUN and creatinine are 18 and 0.9, respectively. Calcium 7.7. White cell count 6. Platelet count 91,000.  His blood sugars on the higher side. We need to make sure that these are well controlled.  We will continue his chemotherapy. Hopefully, our departure date will be either Wednesday or Thursday.  I appreciate the outstanding care that he is getting by the staff on [address]!!  [place]

## 2013-07-02 ENCOUNTER — Ambulatory Visit (HOSPITAL_COMMUNITY): Payer: BC Managed Care – PPO

## 2013-07-02 ENCOUNTER — Encounter (HOSPITAL_COMMUNITY): Payer: Self-pay | Admitting: Radiology

## 2013-07-02 ENCOUNTER — Other Ambulatory Visit (HOSPITAL_COMMUNITY): Payer: Self-pay

## 2013-07-02 LAB — PROTEIN ELECTROPHORESIS, SERUM
Albumin ELP: 68 % — ABNORMAL HIGH (ref 55.8–66.1)
Alpha-1-Globulin: 5 % — ABNORMAL HIGH (ref 2.9–4.9)
Alpha-2-Globulin: 10.5 % (ref 7.1–11.8)
Gamma Globulin: 6.6 % — ABNORMAL LOW (ref 11.1–18.8)
Total Protein ELP: 5.8 g/dL — ABNORMAL LOW (ref 6.0–8.3)

## 2013-07-02 LAB — UIFE/LIGHT CHAINS/TP QN, 24-HR UR
Alpha 2, Urine: DETECTED — AB
Free Kappa Lt Chains,Ur: 13.6 mg/dL — ABNORMAL HIGH (ref 0.14–2.42)
Free Lambda Excretion/Day: 21.42 mg/d
Free Lambda Lt Chains,Ur: 0.51 mg/dL (ref 0.02–0.67)
Free Lt Chn Excr Rate: 571.2 mg/d
Gamma Globulin, Urine: DETECTED — AB
Time: 24 hours
Volume, Urine: 4200 mL

## 2013-07-02 LAB — GLUCOSE, CAPILLARY
Glucose-Capillary: 93 mg/dL (ref 70–99)
Glucose-Capillary: 206 mg/dL — ABNORMAL HIGH (ref 70–99)

## 2013-07-02 LAB — MAGNESIUM: Magnesium: 1.7 mg/dL (ref 1.5–2.5)

## 2013-07-02 LAB — IMMUNOFIXATION ELECTROPHORESIS
IgA: 26 mg/dL — ABNORMAL LOW (ref 68–379)
IgG (Immunoglobin G), Serum: 467 mg/dL — ABNORMAL LOW (ref 650–1600)
IgM, Serum: 5 mg/dL — ABNORMAL LOW (ref 41–251)
Total Protein ELP: 6.3 g/dL (ref 6.0–8.3)

## 2013-07-02 LAB — TYPE AND SCREEN: Unit division: 0

## 2013-07-02 LAB — BASIC METABOLIC PANEL
BUN: 16 mg/dL (ref 6–23)
Calcium: 6.9 mg/dL — ABNORMAL LOW (ref 8.4–10.5)
Chloride: 107 mEq/L (ref 96–112)
Creatinine, Ser: 0.89 mg/dL (ref 0.50–1.35)
GFR calc non Af Amer: >90 mL/min (ref >90)
Glucose, Bld: 103 mg/dL — ABNORMAL HIGH (ref 70–99)

## 2013-07-02 MED ORDER — SODIUM CHLORIDE 0.9 % IV SOLN
150.0000 mg | Freq: Once | INTRAVENOUS | Status: DC
  Filled 2013-07-02: qty 5

## 2013-07-02 MED ORDER — IOHEXOL 300 MG/ML  SOLN
100.0000 mL | Freq: Once | INTRAMUSCULAR | Status: AC | PRN
  Administered 2013-07-02: 100 mL via INTRAVENOUS

## 2013-07-02 MED ORDER — POTASSIUM CHLORIDE 10 MEQ/50ML IV SOLN
10.0000 meq | INTRAVENOUS | Status: AC
  Administered 2013-07-02 (×4): 10 meq via INTRAVENOUS
  Filled 2013-07-02 (×4): qty 50

## 2013-07-02 MED ORDER — LORAZEPAM 1 MG PO TABS: 1.0000 mg | ORAL_TABLET | Freq: Four times a day (QID) | ORAL | Status: DC | PRN

## 2013-07-02 MED ORDER — FUROSEMIDE 10 MG/ML IJ SOLN
40.0000 mg | Freq: Once | INTRAMUSCULAR | Status: AC
  Administered 2013-07-02: 40 mg via INTRAVENOUS
  Filled 2013-07-02: qty 4

## 2013-07-02 MED ORDER — IOHEXOL 300 MG/ML  SOLN
25.0000 mL | INTRAMUSCULAR | Status: AC
  Administered 2013-07-02 (×2): 25 mL via ORAL

## 2013-07-02 NOTE — Progress Notes (Signed)
Mr. Botz is having a lot of nausea now. He does to do this with his chemotherapy. He had some Reglan started last night. I will as an Ativan.  He got his blood yesterday. He did well with this. We will check a CBC on him tomorrow.  Overall, chemotherapy is doing well. He should finishes up today or early tomorrow.  His 24-hour urine shows a nice and continual decrease in his myeloma kappa light chain protein.  We will see what his CT scans show.  Unfortunately, his wife is hospitalized over at [place]. He says she has pneumonia.  On his physical, temperature 98.6. Pulse 94. Blood pressure 120/90. His oral cavity shows no mucositis. There is no adenopathy in the neck. Lungs are clear. Cardiac exam regular in rhythm. Abdomen is soft. He has good bowel sounds. There is a probable hepatosplenomegaly. Extremities shows no clubbing. He does have some trace edema in his legs. Neurological exam is nonfocal.  I will give him some Lasix today. He is quite positive with his fluid intake. I want to make sure that we try to even this out.  Hopefully, we will be able to get him home tomorrow.  I appreciate the great care he is getting from the staff on [address]!!  [place] E.  Psalm 91:1-2

## 2013-07-03 DIAGNOSIS — D6481 Anemia due to antineoplastic chemotherapy: Secondary | ICD-10-CM

## 2013-07-03 LAB — CBC
HCT: 27.2 % — ABNORMAL LOW (ref 39.0–52.0)
Hemoglobin: 9.6 g/dL — ABNORMAL LOW (ref 13.0–17.0)
MCH: 31.5 pg (ref 26.0–34.0)
MCHC: 35.3 g/dL (ref 30.0–36.0)
RBC: 3.05 MIL/uL — ABNORMAL LOW (ref 4.22–5.81)
RDW: 17.4 % — ABNORMAL HIGH (ref 11.5–15.5)

## 2013-07-03 LAB — BASIC METABOLIC PANEL
BUN: 14 mg/dL (ref 6–23)
CO2: 23 mEq/L (ref 19–32)
Chloride: 104 mEq/L (ref 96–112)
Creatinine, Ser: 0.84 mg/dL (ref 0.50–1.35)
GFR calc Af Amer: >90 mL/min (ref >90)
Glucose, Bld: 149 mg/dL — ABNORMAL HIGH (ref 70–99)
Potassium: 3.7 mEq/L (ref 3.5–5.1)

## 2013-07-03 LAB — MAGNESIUM: Magnesium: 1.8 mg/dL (ref 1.5–2.5)

## 2013-07-03 MED ORDER — HEPARIN SOD (PORK) LOCK FLUSH 100 UNIT/ML IV SOLN
500.0000 [IU] | INTRAVENOUS | Status: DC | PRN
  Filled 2013-07-03: qty 5

## 2013-07-03 MED ORDER — PROCHLORPERAZINE MALEATE 10 MG PO TABS: 10.0000 mg | ORAL_TABLET | Freq: Four times a day (QID) | ORAL | Status: DC | PRN

## 2013-07-03 MED ORDER — FLUCONAZOLE 100 MG PO TABS: 100.0000 mg | ORAL_TABLET | Freq: Every day | ORAL | Status: DC

## 2013-07-03 MED ORDER — LEVOFLOXACIN 500 MG PO TABS: 500.0000 mg | ORAL_TABLET | Freq: Every day | ORAL | Status: DC

## 2013-07-03 NOTE — Progress Notes (Signed)
Pt. Was discharged home. He was given his discharge instructions and all questions were answered.  He was transported home by family.

## 2013-07-03 NOTE — Care Management Note (Signed)
Pt discharged, self-care, resides with spouse.No PCP documented. Cm previously provided patient with a list of PCP within pt's payer network.    Roxy Manns Keyleigh Manninen,RN,MSN [phone]

## 2013-07-03 NOTE — Discharge Summary (Signed)
#   [id number] is d/c summary.  Lawerance Sabal 5:16

## 2013-07-03 NOTE — Progress Notes (Signed)
Lab results for Kappa/lambda light chains were sent up via lab and placed by this RN in pt's chart. Eugene Garnet RN

## 2013-07-04 ENCOUNTER — Other Ambulatory Visit: Payer: Self-pay | Admitting: Hematology & Oncology

## 2013-07-04 ENCOUNTER — Ambulatory Visit (HOSPITAL_BASED_OUTPATIENT_CLINIC_OR_DEPARTMENT_OTHER): Payer: BC Managed Care – PPO

## 2013-07-04 ENCOUNTER — Other Ambulatory Visit (HOSPITAL_BASED_OUTPATIENT_CLINIC_OR_DEPARTMENT_OTHER): Payer: BC Managed Care – PPO | Admitting: Lab

## 2013-07-04 VITALS — BP 117/81 | HR 84 | Temp 98.1°F | Resp 18

## 2013-07-04 DIAGNOSIS — C9000 Multiple myeloma not having achieved remission: Secondary | ICD-10-CM

## 2013-07-04 DIAGNOSIS — Z5189 Encounter for other specified aftercare: Secondary | ICD-10-CM

## 2013-07-04 LAB — CBC WITH DIFFERENTIAL (CANCER CENTER ONLY)
BASO#: 0 10*3/uL (ref 0.0–0.2)
EOS%: 0.4 % (ref 0.0–7.0)
HGB: 10.2 g/dL — ABNORMAL LOW (ref 13.0–17.1)
MCH: 31.4 pg (ref 28.0–33.4)
MCHC: 34.2 g/dL (ref 32.0–35.9)
MCV: 92 fL (ref 82–98)
MONO%: 3.1 % (ref 0.0–13.0)
NEUT#: 2.4 10*3/uL (ref 1.5–6.5)
Platelets: 66 10*3/uL — ABNORMAL LOW (ref 145–400)
RBC: 3.25 10*6/uL — ABNORMAL LOW (ref 4.20–5.70)
RDW: 16 % — ABNORMAL HIGH (ref 11.1–15.7)

## 2013-07-04 LAB — CMP (CANCER CENTER ONLY)
AST: 14 U/L (ref 11–38)
Albumin: 3.7 g/dL (ref 3.3–5.5)
Alkaline Phosphatase: 70 U/L (ref 26–84)
BUN, Bld: 14 mg/dL (ref 7–22)
Creat: 1.1 mg/dl (ref 0.6–1.2)
Potassium: 3.5 mEq/L (ref 3.3–4.7)
Sodium: 137 mEq/L (ref 128–145)
Total Protein: 6 g/dL — ABNORMAL LOW (ref 6.4–8.1)

## 2013-07-04 LAB — KAPPA/LAMBDA LIGHT CHAINS
Kappa free light chain: 1.72 mg/dL (ref 0.33–1.94)
Kappa, lambda light chain ratio: 1.74 — ABNORMAL HIGH (ref 0.26–1.65)
Lambda free light chains: 0.99 mg/dL (ref 0.57–2.63)

## 2013-07-04 MED ORDER — PEGFILGRASTIM INJECTION 6 MG/0.6ML
6.0000 mg | Freq: Once | SUBCUTANEOUS | Status: AC
  Administered 2013-07-04: 6 mg via SUBCUTANEOUS

## 2013-07-04 MED ORDER — PEGFILGRASTIM INJECTION 6 MG/0.6ML
SUBCUTANEOUS | Status: AC
  Filled 2013-07-04: qty 0.6

## 2013-07-04 NOTE — Patient Instructions (Signed)

## 2013-07-04 NOTE — Discharge Summary (Signed)
Mark [name], Mark [name]                 ACCOUNT NO.:  [id number]  MEDICAL RECORD NO.:  [id number]  LOCATION:  1316                         FACILITY:  [facility]  PHYSICIAN:  [name], M.D.  DATE OF BIRTH:  March 07, 1971  DATE OF ADMISSION:  06/28/2013 DATE OF DISCHARGE:  07/03/2013                              DISCHARGE SUMMARY   DIAGNOSES UPON DISCHARGE: 1. Kappa light chain myeloma. 2. Status post fourth cycle of chemotherapy with VD-PACE. 3. Anemia secondary to chemotherapy-status post transfusion. 4. Insertion of double-lumen PICC line for chemotherapy. 5. Transient hypokalemia.  CONDITION UPON DISCHARGE:  Stable.  ACTIVITIES:  As tolerated.  DIET:  Without restrictions.  FOLLOWUP: 1. The patient will come to the [facility] on     December 4, for a lab work and Neulasta injection. 2. The patient will have a bone marrow biopsy done on December 24. 3. The patient will follow up at [facility], for stem cell     transplant in January.  MEDICATIONS UPON DISCHARGE: 1. Diflucan 100 mg p.o. daily x14 days. 2. Levaquin 500 mg p.o. daily x14 days. 3. Compazine 10 mg p.o. q.6 hours p.r.n. nausea and vomiting. 4. Famvir 250 mg p.o. daily. 5. Pepcid 20 mg p.o. b.i.d. 6. Oxycodone 5-10 mg p.o. q.6 hours p.r.n. pain. 7. Colace 200 mg p.o. b.i.d.  HOSPITAL COURSE:  Mark [name] was admitted for his fourth cycle of chemotherapy.  He has done incredibly well with his 3 cycles of chemo today.  He has responded very nicely to chemotherapy with a nice decrease in his kappa light chain.  He had a double-lumen PICC line placed for chemotherapy.  He also has a Port-A-Cath that was also accessed.  His body surface area was 2.17 m2.  He received his chemotherapy via infusion over 24 hours a day for 4 days.  He received the Cytoxan and cisplatin and Adriamycin all over infusion.  He also received the continuous IV infusion.  He got Decadron 40 mg a day, IV bolus for  4 days.  He was then premedicated with Aloxi and Emend.  He was  given Lovenox for DVT prophylaxis.  We did go ahead and transfuse him with 2 units of packed red blood cells.  His hemoglobin was 7.2 on the 1st.  I really felt that transfusion would help him as his hemoglobin would probably continue to drop with chemotherapy.  He tolerated the transfusion well.  His kidney function was good during the hospital stay.  He was little dehydrated when he came in.  We gave him IV fluids and his renal function normalized.  On the day of discharge, his BUN is 14, creatinine was 0.84.  Also on day of discharge, his white cell count was 2.2 hemoglobin 9.6, and platelet count was 81,000.  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 Everything looked pretty much stable with his bony lesions.  None of them appeared to be new.  He had a large lesion in the right iliac bone measuring 3 x 2.5 cm.  There was a lesion in the L5 transverse process and pedicle which was stable.  There is again a stable compression fracture at T12.  His liver looked fine.  There is no pulmonary or cardiac abnormalities on the CT scan.  We did a 24-hour urine on him.  This showed 570 mg per day of kappa light chain excretion.  This is down by 50% from his level back in November.  We did a serum protein electrophoresis on him.  He had no monoclonal spike in his serum.  His heavy chains were decreased.  His light chain levels are not back yet.  He had a chest x-ray on admission, which was fine.  He ambulated well.  He was eating well.  He had a little bit of nausea on the 1st, but we get him some extra antiemetics which helped quite a bit.  He completed his chemotherapy on the 2nd.  We continued him on IV fluids.  He was stable to go home on the 3rd.  Upon discharge, his vital signs showed temperature of 98.1, pulse 85, respiratory rate 16, blood pressure 131/90.  Head and neck exam shows  no ocular or oral lesions.  There is no scleral icterus.  He has no mucositis.  There is no adenopathy in the neck.  Lungs are clear bilaterally.  Cardiac exam regular rate and rhythm with normal S1 andS2. There are no murmurs, rubs or bruits.  Abdomen is soft.  He has good bowel sounds.  There is no fluid wave.  There is no palpable abdominal mass.  There is no palpable hepatosplenomegaly.  Extremity shows no clubbing, cyanosis or edema.  He has a good range of motion of his joints.  He has good muscle strength.  Skin exam with no rashes, ecchymosis, or petechia.  Neurological exam no focal neurological deficits.     [name], M.D.     PRE/MEDQ  D:  07/03/2013  T:  07/04/2013  Job:  [id number]

## 2013-07-06 NOTE — Progress Notes (Signed)
DIAGNOSIS:  Kappa light chain myeloma.  CURRENT THERAPY:  Patient status post 3 cycles of salvage chemotherapy with VD-PACE.  INTERIM HISTORY:  Mark [name] comes in for followup.  He went down at [facility] for a second visit.  They will take him for transplant. They wanted to do this in January after all the holidays.  So far, he has responded very nicely to treatment.  We follow his myeloma studies with each cycle.  When we first started, his kappa light chain was 51 mg/dL.  In November, it was down to 4.2 mg/dL.  His 24-hour urine shows his kappa light chain is 179 mg per __________.  He has tolerated chemotherapy very nicely.  He did have a little bit more nausea with the last cycle of chemotherapy.  He just stated that his bones hurt quite a bit.  I think this might be from the chemotherapy.  It also might be from him being anemic.  We have done bone surveys on him.  I will probably do another one when he comes into the hospital this week for treatment.  His appetite has been good.  He has had no problems with swallowing.  He has had no headache.  There are no visual issues.  He has had no change in bowel or bladder habits.  PHYSICAL EXAMINATION:  General:  This is a well-developed, well- nourished black gentleman in no obvious distress.  Vital Signs: Temperature of 98.9, pulse 88, respiratory rate 18, blood pressure 141/78.  Weight is 210 pounds.  Head and Neck:  Normocephalic, atraumatic skull.  There are no ocular or oral lesions.  There are no palpable cervical or supraclavicular lymph nodes.  Lungs:  Clear bilaterally.  Cardiac:  Regular rate and rhythm with a normal S1 and S2. There are no murmurs, rubs or bruits.  Abdomen:  Soft.  He has good bowel sounds.  There is no fluid wave.  There is no palpable hepatosplenomegaly.  Extremities:  No clubbing, cyanosis or edema. Neurological:  No focal neurological deficits.  Skin:  No rashes, ecchymosis, or  petechia.  LABORATORY STUDIES:  White cell count is 3.2, hemoglobin 8.5, hematocrit 26.5, platelet count 83,000.  IMPRESSION:  Mark [name] is a nice 42 year old African American gentleman. He really has resilient kappa light chain myeloma.  One would think that he might be considered for plasma cell leukemia.  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  We will go ahead and get him admitted on the 28th.  I will then plan to reassess him after his chemotherapy.  He is supposed to be due for a bone marrow test on the 24th.  With Mark [name], his hypercalcemia always will indicate whether or not he is responding.  Again, we will get him into the hospital on Friday.  I will plan to see him back after he is discharged.  He will need his Neulasta after he gets discharged.  We will probably have to admit him in December for another cycle of chemotherapy.    ______________________________ [name], M.D. PRE/MEDQ  D:  06/25/2013  T:  07/05/2013  Job:  [id number]

## 2013-07-10 ENCOUNTER — Other Ambulatory Visit: Payer: Self-pay | Admitting: Hematology & Oncology

## 2013-07-10 ENCOUNTER — Telehealth: Payer: Self-pay | Admitting: *Deleted

## 2013-07-10 DIAGNOSIS — C9000 Multiple myeloma not having achieved remission: Secondary | ICD-10-CM

## 2013-07-10 NOTE — Telephone Encounter (Signed)
Pt called stating that he has numbness in his fingers, toes, and right knee feels like it is going to buckle. Denies any swelling or warmth in his knee or leg. Has been having fevers at night but currently 100.8. Also says nothing tastes right. Denies any s/s of infection other than the fever. Is having some problem with buttoning buttons and zipping zippers. Reviewed with Dr Myna Hidalgo. To come in today to check CBC and CMP. And to make sure he is taking Levaquin. Pt stated he could not come in today due to transportation issues but could come in 1st thing tomorrow. Will him come in at 0900. He hasn't started the Levaquin prescribed on 07/03/13. Instructed him to start ASAP as it was to prevent him from infection. He verbalized understanding but then asked if he still needed to come in tomorrow. Explained that he does need to come in as Dr Myna Hidalgo wants to check his labs because of the other problems he is having. He said he would be here in the a.m.

## 2013-07-11 ENCOUNTER — Other Ambulatory Visit (HOSPITAL_BASED_OUTPATIENT_CLINIC_OR_DEPARTMENT_OTHER): Payer: BC Managed Care – PPO | Admitting: Lab

## 2013-07-11 DIAGNOSIS — C9000 Multiple myeloma not having achieved remission: Secondary | ICD-10-CM

## 2013-07-11 LAB — COMPREHENSIVE METABOLIC PANEL
ALT: 14 U/L (ref 0–53)
Alkaline Phosphatase: 94 U/L (ref 39–117)
BUN: 19 mg/dL (ref 6–23)
Chloride: 105 mEq/L (ref 96–112)
Creatinine, Ser: 1.13 mg/dL (ref 0.50–1.35)
Glucose, Bld: 123 mg/dL — ABNORMAL HIGH (ref 70–99)
Sodium: 140 mEq/L (ref 135–145)
Total Bilirubin: 0.5 mg/dL (ref 0.3–1.2)

## 2013-07-11 LAB — CBC WITH DIFFERENTIAL (CANCER CENTER ONLY)
BASO#: 0 10*3/uL (ref 0.0–0.2)
EOS%: 0 % (ref 0.0–7.0)
Eosinophils Absolute: 0 10*3/uL (ref 0.0–0.5)
HGB: 9.4 g/dL — ABNORMAL LOW (ref 13.0–17.1)
LYMPH%: 2.6 % — ABNORMAL LOW (ref 14.0–48.0)
MCH: 30.9 pg (ref 28.0–33.4)
MCHC: 33.2 g/dL (ref 32.0–35.9)
MONO#: 0.7 10*3/uL (ref 0.1–0.9)
MONO%: 17 % — ABNORMAL HIGH (ref 0.0–13.0)
NEUT#: 3.1 10*3/uL (ref 1.5–6.5)
NEUT%: 80.1 % — ABNORMAL HIGH (ref 40.0–80.0)

## 2013-07-15 ENCOUNTER — Telehealth: Payer: Self-pay | Admitting: *Deleted

## 2013-07-15 DIAGNOSIS — G620 Drug-induced polyneuropathy: Secondary | ICD-10-CM

## 2013-07-15 DIAGNOSIS — C9000 Multiple myeloma not having achieved remission: Secondary | ICD-10-CM

## 2013-07-15 MED ORDER — GABAPENTIN 300 MG PO CAPS: 300.0000 mg | ORAL_CAPSULE | Freq: Three times a day (TID) | ORAL | Status: DC

## 2013-07-15 NOTE — Telephone Encounter (Signed)
Pt left a message on the voicemail stating he was out of medication and was having difficulty with pain. Returned call at 1145, no answer. Left message on voicemail asking him to call the office to further discuss his pain.

## 2013-07-24 ENCOUNTER — Other Ambulatory Visit: Payer: Self-pay | Admitting: Hematology & Oncology

## 2013-07-24 ENCOUNTER — Encounter: Payer: Self-pay | Admitting: Nurse Practitioner

## 2013-07-24 DIAGNOSIS — C9000 Multiple myeloma not having achieved remission: Secondary | ICD-10-CM

## 2013-07-24 NOTE — Progress Notes (Signed)
Per Brett Canales at [place], pt was called and informed he is scheduled for his bx on Friday 07/26/13. Pt instructed to be as WL @ 0645 and NPO after midnight on 07/25/13. He verbalized understanding and appreciation.

## 2013-07-25 ENCOUNTER — Other Ambulatory Visit: Payer: Self-pay | Admitting: Radiology

## 2013-07-26 ENCOUNTER — Ambulatory Visit (HOSPITAL_COMMUNITY)
Admission: RE | Admit: 2013-07-26 | Discharge: 2013-07-26 | Disposition: A | Discharge status: Home / Self Care | Payer: BC Managed Care – PPO | Source: Ambulatory Visit | Attending: Hematology & Oncology | Admitting: Hematology & Oncology

## 2013-07-26 ENCOUNTER — Other Ambulatory Visit: Payer: Worker's Compensation | Admitting: Lab

## 2013-07-26 ENCOUNTER — Encounter (HOSPITAL_COMMUNITY): Payer: Self-pay

## 2013-07-26 ENCOUNTER — Other Ambulatory Visit (HOSPITAL_BASED_OUTPATIENT_CLINIC_OR_DEPARTMENT_OTHER): Payer: BC Managed Care – PPO

## 2013-07-26 ENCOUNTER — Ambulatory Visit (HOSPITAL_BASED_OUTPATIENT_CLINIC_OR_DEPARTMENT_OTHER): Payer: BC Managed Care – PPO | Admitting: Hematology & Oncology

## 2013-07-26 VITALS — BP 126/82 | HR 90 | Temp 97.7°F | Resp 18

## 2013-07-26 DIAGNOSIS — D649 Anemia, unspecified: Secondary | ICD-10-CM | POA: Insufficient documentation

## 2013-07-26 DIAGNOSIS — C9000 Multiple myeloma not having achieved remission: Secondary | ICD-10-CM

## 2013-07-26 DIAGNOSIS — Z923 Personal history of irradiation: Secondary | ICD-10-CM | POA: Insufficient documentation

## 2013-07-26 DIAGNOSIS — Z87891 Personal history of nicotine dependence: Secondary | ICD-10-CM | POA: Insufficient documentation

## 2013-07-26 DIAGNOSIS — Z79899 Other long term (current) drug therapy: Secondary | ICD-10-CM | POA: Insufficient documentation

## 2013-07-26 DIAGNOSIS — D704 Cyclic neutropenia: Secondary | ICD-10-CM | POA: Insufficient documentation

## 2013-07-26 LAB — CBC
MCV: 94.7 fL (ref 78.0–100.0)
Platelets: 181 10*3/uL (ref 150–400)
RBC: 2.81 MIL/uL — ABNORMAL LOW (ref 4.22–5.81)
RDW: 18.2 % — ABNORMAL HIGH (ref 11.5–15.5)
WBC: 4.2 10*3/uL (ref 4.0–10.5)

## 2013-07-26 LAB — CMP (CANCER CENTER ONLY)
ALT(SGPT): 18 U/L (ref 10–47)
AST: 23 U/L (ref 11–38)
Albumin: 4 g/dL (ref 3.3–5.5)
Alkaline Phosphatase: 84 U/L (ref 26–84)
Creat: 1.2 mg/dl (ref 0.6–1.2)
Total Bilirubin: 0.7 mg/dl (ref 0.20–1.60)

## 2013-07-26 LAB — PROTIME-INR
INR: 0.93 (ref 0.00–1.49)
Prothrombin Time: 12.3 seconds (ref 11.6–15.2)

## 2013-07-26 LAB — BONE MARROW EXAM: Bone Marrow Exam: 879

## 2013-07-26 MED ORDER — SODIUM CHLORIDE 0.9 % IV SOLN
INTRAVENOUS | Status: DC
  Administered 2013-07-26: 08:00:00 via INTRAVENOUS

## 2013-07-26 MED ORDER — HEPARIN SOD (PORK) LOCK FLUSH 100 UNIT/ML IV SOLN
500.0000 [IU] | INTRAVENOUS | Status: DC | PRN
  Administered 2013-07-26: 500 [IU]
  Filled 2013-07-26 (×2): qty 5

## 2013-07-26 MED ORDER — SODIUM CHLORIDE 0.9 % IJ SOLN
10.0000 mL | Freq: Once | INTRAMUSCULAR | Status: AC
  Administered 2013-07-26: 10 mL via INTRAVENOUS

## 2013-07-26 MED ORDER — TEMAZEPAM 15 MG PO CAPS: 15.0000 mg | ORAL_CAPSULE | Freq: Every evening | ORAL | Status: DC | PRN

## 2013-07-26 MED ORDER — FAMCICLOVIR 500 MG PO TABS: 250.0000 mg | ORAL_TABLET | Freq: Every day | ORAL | Status: DC

## 2013-07-26 MED ORDER — HEPARIN SOD (PORK) LOCK FLUSH 100 UNIT/ML IV SOLN
500.0000 [IU] | INTRAVENOUS | Status: DC
  Filled 2013-07-26: qty 5

## 2013-07-26 MED ORDER — MIDAZOLAM HCL 2 MG/2ML IJ SOLN
INTRAMUSCULAR | Status: AC
  Filled 2013-07-26: qty 4

## 2013-07-26 MED ORDER — MIDAZOLAM HCL 2 MG/2ML IJ SOLN
INTRAMUSCULAR | Status: AC | PRN
  Administered 2013-07-26: 2 mg via INTRAVENOUS

## 2013-07-26 MED ORDER — OXYCODONE HCL 5 MG PO TABS: ORAL_TABLET | ORAL | Status: DC

## 2013-07-26 MED ORDER — FENTANYL CITRATE 0.05 MG/ML IJ SOLN
INTRAMUSCULAR | Status: AC
  Filled 2013-07-26: qty 4

## 2013-07-26 MED ORDER — FENTANYL CITRATE 0.05 MG/ML IJ SOLN
INTRAMUSCULAR | Status: AC | PRN
  Administered 2013-07-26 (×2): 100 ug via INTRAVENOUS

## 2013-07-26 NOTE — H&P (Signed)
Agree.  For BM biopsy under CT guidance today.  Patient seen.

## 2013-07-26 NOTE — Progress Notes (Signed)
This office note has been dictated.

## 2013-07-26 NOTE — H&P (Signed)
Mark [name] is an 42 y.o. male.   Chief Complaint: "I'm having a bone marrow biopsy" HPI: Patient with history of multiple myeloma presents today for CT guided bone marrow biopsy in preparation for upcoming stem cell transplant.  Past Medical History  Diagnosis Date  . History of radiation therapy 02/07/13- 02/26/13    lower L spine, upper sacrum, 35 gray in 14 fractions  . Cancer   . Multiple myeloma dx'd 01/2013    Past Surgical History  Procedure Laterality Date  . Portacath placement      Family History  Problem Relation Age of Onset  . Diabetic kidney disease Mother   . Hypertension Mother   . Heart attack Mother   . Kidney failure Mother   . Coronary artery disease Mother   . HIV Father    Social History:  reports that he quit smoking about 10 years ago. His smoking use included Cigarettes. He has a 15 pack-year smoking history. He has never used smokeless tobacco. He reports that he does not drink alcohol or use illicit drugs.  Allergies: No Known Allergies  Current outpatient prescriptions:docusate sodium (COLACE) 100 MG capsule, Take 200 mg by mouth 2 (two) times daily., Disp: , Rfl: ;  famciclovir (FAMVIR) 500 MG tablet, Take 0.5 tablets (250 mg total) by mouth daily., Disp: 30 tablet, Rfl: 0;  famotidine (PEPCID) 20 MG tablet, Take 1 tablet (20 mg total) by mouth 2 (two) times daily., Disp: 60 tablet, Rfl: 0 fluconazole (DIFLUCAN) 100 MG tablet, Take 1 tablet (100 mg total) by mouth daily., Disp: 14 tablet, Rfl: 0;  pyridOXINE (VITAMIN B-6) 100 MG tablet, Take 100 mg by mouth 2 (two) times daily., Disp: , Rfl: ;  acetaminophen (TYLENOL) 500 MG tablet, Take 1,000 mg by mouth every 6 (six) hours as needed (pain)., Disp: , Rfl: ;  gabapentin (NEURONTIN) 300 MG capsule, Take 1 capsule (300 mg total) by mouth 3 (three) times daily., Disp: 90 capsule, Rfl: 2 ibuprofen (ADVIL,MOTRIN) 200 MG tablet, Take 400-600 mg by mouth every 6 (six) hours as needed (pain/fever)., Disp: , Rfl:   Current facility-administered medications:0.9 %  sodium chloride infusion, , Intravenous, Continuous, [name], PA-C, Last Rate: 20 mL/hr at 07/26/13 0800   Results for orders placed during the hospital encounter of 07/26/13 (from the past 48 hour(s))  CBC     Status: Abnormal   Collection Time    07/26/13  7:20 AM      Result Value Range   WBC 4.2  4.0 - 10.5 K/uL   RBC 2.81 (*) 4.22 - 5.81 MIL/uL   Hemoglobin 9.0 (*) 13.0 - 17.0 g/dL   HCT 16.1 (*) 09.6 - 04.5 %   MCV 94.7  78.0 - 100.0 fL   MCH 32.0  26.0 - 34.0 pg   MCHC 33.8  30.0 - 36.0 g/dL   RDW 40.9 (*) 81.1 - 91.4 %   Platelets 181  150 - 400 K/uL  PROTIME-INR     Status: None   Collection Time    07/26/13  7:20 AM      Result Value Range   Prothrombin Time 12.3  11.6 - 15.2 seconds   INR 0.93  0.00 - 1.49   No results found.  Review of Systems  Constitutional:       Mild elevation in temp with occ chill  Respiratory: Negative for cough and shortness of breath.   Cardiovascular:       Occ left sided chest discomfort  Gastrointestinal:  Negative for nausea, vomiting and abdominal pain.  Musculoskeletal: Positive for back pain.  Neurological: Negative for headaches.  Endo/Heme/Allergies: Does not bruise/bleed easily.    Blood pressure 131/85, pulse 87, temperature 99.1 F (37.3 C), temperature source Oral, resp. rate 18, height 5\' 11"  (1.803 m), weight 209 lb (94.802 kg), SpO2 99.00%. Physical Exam  Constitutional: He is oriented to person, place, and time. He appears well-developed and well-nourished.  Cardiovascular: Normal rate and regular rhythm.   Respiratory: Effort normal and breath sounds normal.  Clean, intact rt chest wall PAC  GI: Soft. Bowel sounds are normal. There is no tenderness.  Musculoskeletal: Normal range of motion. He exhibits no edema.  Neurological: He is alert and oriented to person, place, and time.     Assessment/Plan Pt with hx of multiple myeloma. Plan is for CT guided bone  marrow biopsy today in preparation for upcoming stem cell transplant. Details/risks of procedure d/w pt/mother with their understanding and consent.  Mark [name],D KEVIN 07/26/2013, 8:14 AM

## 2013-07-26 NOTE — Procedures (Signed)
Procedure:  Right iliac bone marrow biopsy under CT guidance Findings:  Aspirate and core biopsy samples obtained.

## 2013-07-27 NOTE — Progress Notes (Signed)
DIAGNOSIS:  Kappa light chain myeloma.  CURRENT THERAPY: 1. The patient is status post 4 cycles of VD-PACE. 2. Zometa 4 mg IV q.3 weeks. 3. Neulasta 6 mg for chemotherapy-induced neutropenia.  INTERIM HISTORY:  Mark [name] comes in for a visit.  He did have a bone marrow biopsy done today.  This was done at [facility].  They were very kind in doing this for [place].  He has responded incredibly well to chemotherapy.  When we started his chemotherapy, his kappa light chain was over 50.  When we checked it on the day of admission, it is now down to 1.72 mg/dL.  His kappa light chain excretion was down to 571 mg/dL.  This was back in November.  We did a CT scan while he was in the hospital.  This was of the chest, abdomen, and pelvis.  This showed stable bony disease.  He had no parenchymal organ involvement with respect to the myeloma.  He is getting ready for transplant at [facility].  He goes there on January 2nd, for an evaluation.  His wife unfortunately, has a thymoma.  She will have surgery to remove this on January 2nd.  He does feel tired.  He is having some pain, where he had a bone marrow biopsy.  We will go ahead and refill his oxycodone.  His appetite has been doing fairly well.  He had a little bit of nausea and vomiting with the last cycle of chemotherapy.  Afterwards, he did well.  He did have a temperature when he got home after his chemotherapy.  He was not taking his Levaquin as he should.  He also ran out of his Famvir.  I refilled the Famvir.  Overall, his performance status is ECOG 1.  PHYSICAL EXAMINATION:  General:  This is a well-developed, well- nourished African American gentleman, in no obvious distress.  Vital Signs:  Temperature 97.7, pulse 72, respiratory rate 18, blood pressure 128/79, weight is 209 pounds.  Head and Neck:  Normocephalic, atraumatic skull.  There are no ocular or oral lesions.  There are no palpable cervical or  supraclavicular lymph nodes.  Lungs:  Clear bilaterally. Cardiac:  Regular rate and rhythm with a normal S1, S2.  There are no murmurs, rubs, or bruits.  Abdomen:  Soft.  He has good bowel sounds. There is no fluid wave.  There is no palpable abdominal mass.  There is no palpable hepatosplenomegaly.  Back:  No tenderness over the spine, ribs, or hips.  Extremities:  No clubbing, cyanosis, or edema. Neurologic:  No focal neurological deficits.  Skin:  No rash, ecchymosis, or petechia.  LABORATORY STUDIES:  White cell count is 4.2, hemoglobin 9, hematocrit 26.6, platelet count 181.  IMPRESSION:  Mark [name] is a nice 42 year old African American gentleman. He presented with incredibly aggressive myeloma.  He had light chain myeloma.  I thought, he also was bordering on plasma cell leukemia.  He has multiple chromosomal abnormalities.  His myeloma numbers have improved incredibly well.  His kappa light chain in the serum has gone down by over 95%.  He did have the bone marrow biopsy done today.  He will be very interested to see how this looks.  Also be very interested to see what the cytogenetics are.  We will now transition him over to [facility].  We will be very happy to see him back whenever we need to, for the lab work or supportive care.    ______________________________  [name], M.D. PRE/MEDQ  D:  07/26/2013  T:  07/27/2013  Job:  [id number]

## 2013-07-29 LAB — KAPPA/LAMBDA LIGHT CHAINS
Kappa free light chain: 2.47 mg/dL — ABNORMAL HIGH (ref 0.33–1.94)
Kappa:Lambda Ratio: 1.78 — ABNORMAL HIGH (ref 0.26–1.65)
Lambda Free Lght Chn: 1.39 mg/dL (ref 0.57–2.63)

## 2013-07-29 LAB — LACTATE DEHYDROGENASE: LDH: 247 U/L (ref 94–250)

## 2013-07-29 LAB — IGG, IGA, IGM: IgG (Immunoglobin G), Serum: 483 mg/dL — ABNORMAL LOW (ref 650–1600)

## 2013-07-31 ENCOUNTER — Telehealth: Payer: Self-pay | Admitting: Nurse Practitioner

## 2013-07-31 NOTE — Telephone Encounter (Addendum)
Message copied by Glee Arvin on Wed Jul 31, 2013 10:09 AM ------      Message from: [name]      Created: Tue Jul 30, 2013  9:49 PM       Call - bone marrow only shows minimal myeloma now!!  This is fantastic!! Pete ------Pt verbalized understanding and appreciation.

## 2013-08-05 LAB — CHROMOSOME ANALYSIS, BONE MARROW

## 2013-08-05 LAB — TISSUE HYBRIDIZATION (BONE MARROW)-NCBH

## 2013-08-12 ENCOUNTER — Telehealth: Payer: Self-pay | Admitting: Hematology & Oncology

## 2013-08-12 NOTE — Telephone Encounter (Signed)
Faxed Medical Records via fax today  to:  °[facility] °Dr. Gabriel/Pat Odell RN ° °Ph: [phone] °Fx:  [phone] ° °Medical  Records requested recent bone marrow biopsy report ° ° °CONSENT COPY SCANNED ° ° °

## 2013-08-14 ENCOUNTER — Encounter: Payer: Self-pay | Admitting: Hematology & Oncology

## 2013-08-18 ENCOUNTER — Emergency Department (HOSPITAL_COMMUNITY): Payer: BC Managed Care – PPO

## 2013-08-18 ENCOUNTER — Inpatient Hospital Stay (HOSPITAL_COMMUNITY)
Admission: EM | Admit: 2013-08-18 | Discharge: 2013-08-21 | DRG: 872 | Disposition: A | Discharge status: Home / Self Care | Payer: BC Managed Care – PPO | Attending: Internal Medicine | Admitting: Internal Medicine

## 2013-08-18 ENCOUNTER — Encounter (HOSPITAL_COMMUNITY): Payer: Self-pay | Admitting: Emergency Medicine

## 2013-08-18 DIAGNOSIS — A419 Sepsis, unspecified organism: Principal | ICD-10-CM | POA: Diagnosis present

## 2013-08-18 DIAGNOSIS — K219 Gastro-esophageal reflux disease without esophagitis: Secondary | ICD-10-CM | POA: Diagnosis present

## 2013-08-18 DIAGNOSIS — Z87891 Personal history of nicotine dependence: Secondary | ICD-10-CM

## 2013-08-18 DIAGNOSIS — R6889 Other general symptoms and signs: Secondary | ICD-10-CM

## 2013-08-18 DIAGNOSIS — R059 Cough, unspecified: Secondary | ICD-10-CM | POA: Diagnosis present

## 2013-08-18 DIAGNOSIS — C9000 Multiple myeloma not having achieved remission: Secondary | ICD-10-CM | POA: Diagnosis present

## 2013-08-18 DIAGNOSIS — R0989 Other specified symptoms and signs involving the circulatory and respiratory systems: Secondary | ICD-10-CM | POA: Diagnosis present

## 2013-08-18 DIAGNOSIS — R079 Chest pain, unspecified: Secondary | ICD-10-CM | POA: Diagnosis present

## 2013-08-18 DIAGNOSIS — J111 Influenza due to unidentified influenza virus with other respiratory manifestations: Secondary | ICD-10-CM | POA: Diagnosis present

## 2013-08-18 DIAGNOSIS — N182 Chronic kidney disease, stage 2 (mild): Secondary | ICD-10-CM

## 2013-08-18 DIAGNOSIS — G8929 Other chronic pain: Secondary | ICD-10-CM | POA: Diagnosis present

## 2013-08-18 DIAGNOSIS — M549 Dorsalgia, unspecified: Secondary | ICD-10-CM

## 2013-08-18 DIAGNOSIS — D849 Immunodeficiency, unspecified: Secondary | ICD-10-CM

## 2013-08-18 DIAGNOSIS — D649 Anemia, unspecified: Secondary | ICD-10-CM

## 2013-08-18 DIAGNOSIS — Z923 Personal history of irradiation: Secondary | ICD-10-CM

## 2013-08-18 DIAGNOSIS — R05 Cough: Secondary | ICD-10-CM | POA: Diagnosis present

## 2013-08-18 DIAGNOSIS — D899 Disorder involving the immune mechanism, unspecified: Secondary | ICD-10-CM | POA: Diagnosis present

## 2013-08-18 DIAGNOSIS — Z8249 Family history of ischemic heart disease and other diseases of the circulatory system: Secondary | ICD-10-CM

## 2013-08-18 LAB — URINALYSIS, ROUTINE W REFLEX MICROSCOPIC
Bilirubin Urine: NEGATIVE
Glucose, UA: NEGATIVE mg/dL
HGB URINE DIPSTICK: NEGATIVE
KETONES UR: NEGATIVE mg/dL
Leukocytes, UA: NEGATIVE
Nitrite: NEGATIVE
PROTEIN: NEGATIVE mg/dL
Specific Gravity, Urine: 1.016 (ref 1.005–1.030)
UROBILINOGEN UA: 2 mg/dL — AB (ref 0.0–1.0)
pH: 6.5 (ref 5.0–8.0)

## 2013-08-18 LAB — COMPREHENSIVE METABOLIC PANEL
ALBUMIN: 3.9 g/dL (ref 3.5–5.2)
ALK PHOS: 84 U/L (ref 39–117)
ALT: 15 U/L (ref 0–53)
AST: 17 U/L (ref 0–37)
BILIRUBIN TOTAL: 1.1 mg/dL (ref 0.3–1.2)
BUN: 14 mg/dL (ref 6–23)
CHLORIDE: 100 meq/L (ref 96–112)
CO2: 24 mEq/L (ref 19–32)
Calcium: 7.7 mg/dL — ABNORMAL LOW (ref 8.4–10.5)
Creatinine, Ser: 1.24 mg/dL (ref 0.50–1.35)
GFR calc Af Amer: 82 mL/min — ABNORMAL LOW (ref >90)
GFR calc non Af Amer: 71 mL/min — ABNORMAL LOW (ref >90)
Glucose, Bld: 122 mg/dL — ABNORMAL HIGH (ref 70–99)
Potassium: 3.4 mEq/L — ABNORMAL LOW (ref 3.7–5.3)
Sodium: 139 mEq/L (ref 137–147)
Total Protein: 6.6 g/dL (ref 6.0–8.3)

## 2013-08-18 MED ORDER — SODIUM CHLORIDE 0.9 % IV BOLUS (SEPSIS)
1000.0000 mL | Freq: Once | INTRAVENOUS | Status: AC
  Administered 2013-08-18: 1000 mL via INTRAVENOUS

## 2013-08-18 MED ORDER — SODIUM CHLORIDE 0.9 % IV BOLUS (SEPSIS)
1000.0000 mL | Freq: Once | INTRAVENOUS | Status: AC
  Administered 2013-08-19: 1000 mL via INTRAVENOUS

## 2013-08-18 MED ORDER — SODIUM CHLORIDE 0.9 % IV SOLN
Freq: Once | INTRAVENOUS | Status: AC
  Administered 2013-08-19: 01:00:00 via INTRAVENOUS

## 2013-08-18 NOTE — ED Notes (Signed)
Patient is alert and oriented x3.  He is complaining of generalized body aches with a fever that  Started yesterday.  Patient is a cancer patient that recently had chemo treatment.  Currently he  Rates his pain 7 of 10 with nausea.  Patient also states that his cath site is hurting.

## 2013-08-19 ENCOUNTER — Encounter (HOSPITAL_COMMUNITY): Payer: Self-pay | Admitting: *Deleted

## 2013-08-19 DIAGNOSIS — J111 Influenza due to unidentified influenza virus with other respiratory manifestations: Secondary | ICD-10-CM

## 2013-08-19 DIAGNOSIS — R197 Diarrhea, unspecified: Secondary | ICD-10-CM

## 2013-08-19 DIAGNOSIS — A419 Sepsis, unspecified organism: Principal | ICD-10-CM

## 2013-08-19 DIAGNOSIS — C9000 Multiple myeloma not having achieved remission: Secondary | ICD-10-CM

## 2013-08-19 DIAGNOSIS — D899 Disorder involving the immune mechanism, unspecified: Secondary | ICD-10-CM

## 2013-08-19 DIAGNOSIS — D649 Anemia, unspecified: Secondary | ICD-10-CM

## 2013-08-19 LAB — COMPREHENSIVE METABOLIC PANEL
ALT: 12 U/L (ref 0–53)
AST: 16 U/L (ref 0–37)
Albumin: 3.2 g/dL — ABNORMAL LOW (ref 3.5–5.2)
Alkaline Phosphatase: 71 U/L (ref 39–117)
BUN: 13 mg/dL (ref 6–23)
CALCIUM: 6.8 mg/dL — AB (ref 8.4–10.5)
CO2: 22 meq/L (ref 19–32)
Chloride: 104 mEq/L (ref 96–112)
Creatinine, Ser: 1.05 mg/dL (ref 0.50–1.35)
GFR, EST NON AFRICAN AMERICAN: 87 mL/min — AB (ref >90)
Glucose, Bld: 149 mg/dL — ABNORMAL HIGH (ref 70–99)
Potassium: 3.6 mEq/L — ABNORMAL LOW (ref 3.7–5.3)
SODIUM: 139 meq/L (ref 137–147)
Total Bilirubin: 0.8 mg/dL (ref 0.3–1.2)
Total Protein: 5.6 g/dL — ABNORMAL LOW (ref 6.0–8.3)

## 2013-08-19 LAB — CBC WITH DIFFERENTIAL/PLATELET
BASOS ABS: 0 10*3/uL (ref 0.0–0.1)
BASOS PCT: 0 % (ref 0–1)
BASOS PCT: 0 % (ref 0–1)
Basophils Absolute: 0 10*3/uL (ref 0.0–0.1)
EOS ABS: 0 10*3/uL (ref 0.0–0.7)
Eosinophils Absolute: 0 10*3/uL (ref 0.0–0.7)
Eosinophils Relative: 0 % (ref 0–5)
Eosinophils Relative: 0 % (ref 0–5)
HCT: 26.4 % — ABNORMAL LOW (ref 39.0–52.0)
HEMATOCRIT: 31 % — AB (ref 39.0–52.0)
HEMOGLOBIN: 10.5 g/dL — AB (ref 13.0–17.0)
HEMOGLOBIN: 9 g/dL — AB (ref 13.0–17.0)
LYMPHS PCT: 3 % — AB (ref 12–46)
LYMPHS PCT: 3 % — AB (ref 12–46)
Lymphs Abs: 0.6 10*3/uL — ABNORMAL LOW (ref 0.7–4.0)
Lymphs Abs: 0.4 10*3/uL — ABNORMAL LOW (ref 0.7–4.0)
MCH: 31.1 pg (ref 26.0–34.0)
MCH: 31.3 pg (ref 26.0–34.0)
MCHC: 33.9 g/dL (ref 30.0–36.0)
MCHC: 34.1 g/dL (ref 30.0–36.0)
MCV: 91.7 fL (ref 78.0–100.0)
MCV: 91.7 fL (ref 78.0–100.0)
Monocytes Absolute: 0 10*3/uL — ABNORMAL LOW (ref 0.1–1.0)
Monocytes Absolute: 0 10*3/uL — ABNORMAL LOW (ref 0.1–1.0)
Monocytes Relative: 0 % — ABNORMAL LOW (ref 3–12)
Monocytes Relative: 0 % — ABNORMAL LOW (ref 3–12)
NEUTROS ABS: 19.8 10*3/uL — AB (ref 1.7–7.7)
NEUTROS PCT: 97 % — AB (ref 43–77)
Neutro Abs: 13.6 10*3/uL — ABNORMAL HIGH (ref 1.7–7.7)
Neutrophils Relative %: 97 % — ABNORMAL HIGH (ref 43–77)
PLATELETS: 97 10*3/uL — AB (ref 150–400)
Platelets: 126 10*3/uL — ABNORMAL LOW (ref 150–400)
RBC: 3.38 MIL/uL — ABNORMAL LOW (ref 4.22–5.81)
RBC: 2.88 MIL/uL — ABNORMAL LOW (ref 4.22–5.81)
RDW: 15.1 % (ref 11.5–15.5)
RDW: 15.2 % (ref 11.5–15.5)
WBC Morphology: INCREASED
WBC: 20.4 10*3/uL — AB (ref 4.0–10.5)
WBC: 14 10*3/uL — ABNORMAL HIGH (ref 4.0–10.5)

## 2013-08-19 LAB — LEGIONELLA ANTIGEN, URINE: LEGIONELLA ANTIGEN, URINE: NEGATIVE

## 2013-08-19 LAB — EXPECTORATED SPUTUM ASSESSMENT W REFEX TO RESP CULTURE

## 2013-08-19 LAB — CG4 I-STAT (LACTIC ACID): Lactic Acid, Venous: 0.62 mmol/L (ref 0.5–2.2)

## 2013-08-19 LAB — EXPECTORATED SPUTUM ASSESSMENT W GRAM STAIN, RFLX TO RESP C

## 2013-08-19 LAB — STREP PNEUMONIAE URINARY ANTIGEN: Strep Pneumo Urinary Antigen: NEGATIVE

## 2013-08-19 LAB — INFLUENZA PANEL BY PCR (TYPE A & B)
H1N1FLUPCR: NOT DETECTED
INFLAPCR: POSITIVE — AB
INFLBPCR: NEGATIVE

## 2013-08-19 MED ORDER — OSELTAMIVIR PHOSPHATE 75 MG PO CAPS
75.0000 mg | ORAL_CAPSULE | Freq: Once | ORAL | Status: AC
  Administered 2013-08-19: 75 mg via ORAL
  Filled 2013-08-19: qty 1

## 2013-08-19 MED ORDER — HEPARIN SODIUM (PORCINE) 5000 UNIT/ML IJ SOLN
5000.0000 [IU] | Freq: Three times a day (TID) | INTRAMUSCULAR | Status: DC
  Administered 2013-08-19 (×2): 5000 [IU] via SUBCUTANEOUS
  Filled 2013-08-19 (×4): qty 1

## 2013-08-19 MED ORDER — FAMCICLOVIR 500 MG PO TABS
500.0000 mg | ORAL_TABLET | Freq: Every day | ORAL | Status: DC
  Administered 2013-08-20: 500 mg via ORAL
  Filled 2013-08-19 (×2): qty 1

## 2013-08-19 MED ORDER — FLUCONAZOLE 200 MG PO TABS
200.0000 mg | ORAL_TABLET | Freq: Every day | ORAL | Status: DC
  Administered 2013-08-19 – 2013-08-20 (×2): 200 mg via ORAL
  Filled 2013-08-19 (×3): qty 1

## 2013-08-19 MED ORDER — VANCOMYCIN HCL IN DEXTROSE 1-5 GM/200ML-% IV SOLN
1000.0000 mg | Freq: Once | INTRAVENOUS | Status: AC
  Administered 2013-08-19: 1000 mg via INTRAVENOUS
  Filled 2013-08-19: qty 200

## 2013-08-19 MED ORDER — FAMCICLOVIR 500 MG PO TABS
250.0000 mg | ORAL_TABLET | Freq: Every day | ORAL | Status: DC
  Administered 2013-08-19: 250 mg via ORAL
  Filled 2013-08-19: qty 0.5

## 2013-08-19 MED ORDER — OXYCODONE HCL 5 MG PO TABS
5.0000 mg | ORAL_TABLET | ORAL | Status: DC | PRN
  Filled 2013-08-19 (×2): qty 2

## 2013-08-19 MED ORDER — ACETAMINOPHEN 325 MG PO TABS
650.0000 mg | ORAL_TABLET | ORAL | Status: DC | PRN
  Filled 2013-08-19 (×2): qty 2

## 2013-08-19 MED ORDER — DEXTROSE 5 % IV SOLN
1.0000 g | Freq: Two times a day (BID) | INTRAVENOUS | Status: DC
  Administered 2013-08-19: 1 g via INTRAVENOUS
  Filled 2013-08-19 (×2): qty 1

## 2013-08-19 MED ORDER — SODIUM CHLORIDE 0.9 % IV SOLN
INTRAVENOUS | Status: AC
  Administered 2013-08-19: 13:00:00 via INTRAVENOUS

## 2013-08-19 MED ORDER — SODIUM CHLORIDE 0.9 % IJ SOLN: 10.0000 mL | Freq: Two times a day (BID) | INTRAMUSCULAR | Status: DC

## 2013-08-19 MED ORDER — DEXTROSE 5 % IV SOLN
1.0000 g | Freq: Three times a day (TID) | INTRAVENOUS | Status: DC
  Administered 2013-08-19 – 2013-08-20 (×4): 1 g via INTRAVENOUS
  Filled 2013-08-19 (×7): qty 1

## 2013-08-19 MED ORDER — FAMOTIDINE 20 MG PO TABS
20.0000 mg | ORAL_TABLET | Freq: Two times a day (BID) | ORAL | Status: DC
  Administered 2013-08-19: 20 mg via ORAL
  Filled 2013-08-19 (×6): qty 1

## 2013-08-19 MED ORDER — BIOTENE DRY MOUTH MT LIQD: 15.0000 mL | OROMUCOSAL | Status: DC | PRN

## 2013-08-19 MED ORDER — SODIUM CHLORIDE 0.9 % IJ SOLN
10.0000 mL | INTRAMUSCULAR | Status: DC | PRN
  Administered 2013-08-21: 10 mL

## 2013-08-19 MED ORDER — VITAMIN B-6 100 MG PO TABS
100.0000 mg | ORAL_TABLET | Freq: Two times a day (BID) | ORAL | Status: DC
  Administered 2013-08-19 (×2): 100 mg via ORAL
  Filled 2013-08-19 (×6): qty 1

## 2013-08-19 MED ORDER — DOCUSATE SODIUM 100 MG PO CAPS
200.0000 mg | ORAL_CAPSULE | Freq: Two times a day (BID) | ORAL | Status: DC
  Administered 2013-08-19: 200 mg via ORAL

## 2013-08-19 MED ORDER — ENOXAPARIN SODIUM 40 MG/0.4ML ~~LOC~~ SOLN
40.0000 mg | SUBCUTANEOUS | Status: DC
  Filled 2013-08-19 (×3): qty 0.4

## 2013-08-19 MED ORDER — OSELTAMIVIR PHOSPHATE 75 MG PO CAPS
75.0000 mg | ORAL_CAPSULE | Freq: Two times a day (BID) | ORAL | Status: DC
  Administered 2013-08-19 – 2013-08-21 (×5): 75 mg via ORAL
  Filled 2013-08-19 (×6): qty 1

## 2013-08-19 MED ORDER — VANCOMYCIN HCL IN DEXTROSE 1-5 GM/200ML-% IV SOLN
1000.0000 mg | Freq: Three times a day (TID) | INTRAVENOUS | Status: DC
  Administered 2013-08-19 – 2013-08-20 (×4): 1000 mg via INTRAVENOUS
  Filled 2013-08-19 (×5): qty 200

## 2013-08-19 MED ORDER — FILGRASTIM 480 MCG/1.6ML IJ SOLN
960.0000 ug | Freq: Every day | INTRAMUSCULAR | Status: DC
  Administered 2013-08-19 – 2013-08-21 (×3): 960 ug via SUBCUTANEOUS
  Filled 2013-08-19 (×3): qty 3.2

## 2013-08-19 NOTE — Progress Notes (Signed)
Utilization review completed.  

## 2013-08-19 NOTE — Progress Notes (Addendum)
Mark [name]  [phone] Holiday NOTE  Mark [name]                                MR#: [id number]  DOB: 1971-04-21                       CSN#: [id number]  Reason for Consult: r/o Multiple Myeloma  DIAGNOSIS: Kappa light chain myeloma.   CURRENT THERAPY:  1. The patient is status post 4 cycles of VD-PACE.  2. Zometa 4 mg IV q.3 weeks.  3. Neulasta 6 mg for chemotherapy-induced neutropenia.  [name] is a 43 y.o.   male last seen at the office on 07/26/13, at which time a bone marrow biopsy had been performed. He had responded to chemo well to that point. He is currently in follow that Mark [name], last seen on 08/15/2013,where he is receiving chemotherapy mobilization with a combination of  etoposide 326m/m2 daily for 2 days, followed by G-CSF at a dose of 960 mcg once daily (10 mcg/kg/day). Day 1 was 08/15/2013 of etoposide treatment. He was to begin  Levofloxacin prophylaxis will begin on day 5 of therapy. He received 2 doses of Neupogen and 960 mcg injection daily,x2, off a total of 7 planned.his admission white count was 20, today at 14. the was scheduled for a total of stem cell transplant. He was admitted on on 08/19/2013 with generalized body aches fever and chills, chest congestion. He also noted yellow productive sputum.chest x-ray showed no acute findings.  We have been informed of the patient's admission, to help in the management of his oncologic and issues.   PMH:  Past Medical History  Diagnosis Date  . History of radiation therapy 02/07/13- 02/26/13    lower L spine, upper sacrum, 35 gray in 14 fractions  . Cancer   . Multiple myeloma dx'd 01/2013    Surgeries:  Past Surgical History  Procedure Laterality Date  . Portacath placement      Allergies: No Known Allergies  Medications:   Prior to Admission:  . sodium chloride   Intravenous STAT  . ceFEPime  (MAXIPIME) IV  1 g Intravenous Q8H  . docusate sodium  200 mg Oral BID  . famciclovir  250 mg Oral Daily  . famotidine  20 mg Oral BID  . heparin  5,000 Units Subcutaneous Q8H  . oseltamivir  75 mg Oral BID  . pyridOXINE  100 mg Oral BID  . vancomycin  1,000 mg Intravenous Q8H    PTXH:FSFSELTRV ROS: See history of present illness for significant positives, rest of the review of systems is negative.  Family History:    Family History  Problem Relation Age of Onset  . Diabetic kidney disease Mother   . Hypertension Mother   . Heart attack Mother   . Kidney failure Mother   . Coronary artery disease Mother   . HIV Father      Social History:  reports that he quit smoking about 10 years ago. His smoking use included Cigarettes. He has a 15 pack-year smoking history. He has never used smokeless tobacco. He reports that he does not drink alcohol or use illicit drugs.  Physical Exam     Filed Vitals:   08/19/13 0453  BP: 113/70  Pulse: 107  Temp: 100.1 F (37.8 C)  Resp:  20     Filed Weights   08/18/13 2215  Weight: 217 lb (98.431 kg)    General:  62 -year-old [place] American male  in no acute distress A. and O. x3  well-developed, well-nourished.  HEENT: Normocephalic, atraumatic, PERRLA. Sclerae anicteric.No periorbital edema. Oral cavity without thrush or lesions.No glossitis. NECK:supple. no thyromegaly, no cervical or supraclavicular adenopathy  LUNGS: clear bilaterally . No wheezing, rhonchi or rales. No axillary masses.port is nontender BREASTS: not examined. CARDIOVASCULAR: regular rate and rhythm,no murmur , rubs or gallops ABDOMEN: soft nontender , bowel sounds x4. No HSM. No organomegaly.  GU/rectal: deferred. EXTREMITIES: no clubbing cyanosis or edema. No bruising or petechial rash MUSCULOSKELETAL: no spinal tenderness.  NEURO: Non Focal. No Horner's.   Labs:     CBC   Recent Labs Lab 08/18/13 2300 08/19/13 0430  WBC 20.4* 14.0*  HGB 10.5*  9.0*  HCT 31.0* 26.4*  PLT 126* 97*  MCV 91.7 91.7  MCH 31.1 31.3  MCHC 33.9 34.1  RDW 15.1 15.2  LYMPHSABS 0.6* 0.4*  MONOABS 0.0* 0.0*  EOSABS 0.0 0.0  BASOSABS 0.0 0.0     CMP    Recent Labs Lab 08/18/13 2300 08/19/13 0430  NA 139 139  K 3.4* 3.6*  CL 100 104  CO2 24 22  GLUCOSE 122* 149*  BUN 14 13  CREATININE 1.24 1.05  CALCIUM 7.7* 6.8*  AST 17 16  ALT 15 12  ALKPHOS 84 71  BILITOT 1.1 0.8     Anemia panel:  No results found for this basename: VITAMINB12, FOLATE, FERRITIN, TIBC, IRON, RETICCTPCT,  in the last 72 hours   Imaging Studies:  Ct Biopsy  07/26/2013   CLINICAL DATA:  Multiple myeloma and need for repeat bone marrow biopsy prior to stem cell harvesting for bone marrow transplant.  EXAM: CT BIOPSY  ANESTHESIA/SEDATION: Versed 2.0 mg IV, Fentanyl 200 mcg IV  Total Moderate Sedation Time  15 minutes.  PROCEDURE: The procedure risks, benefits, and alternatives were explained to the patient. Questions regarding the procedure were encouraged and answered. The patient understands and consents to the procedure.  The right gluteal region was prepped with Betadine. Sterile gown and sterile gloves were used for the procedure. Local anesthesia was provided with 1% Lidocaine.  Under CT guidance, an 11 gauge OnControl bone cutting needle was advanced from a posterior approach into the right iliac bone. Needle positioning was confirmed with CT. Initial non heparinized and heparinized aspirate samples were obtained of bone marrow.  Core biopsy was performed via the core biopsy needle. Two separate core biopsy samples were obtained.  COMPLICATIONS: None  FINDINGS: Inspection of initial aspirate did reveal visible particles. Intact core biopsy samples were able to be obtained.  IMPRESSION: CT guided bone marrow biopsy of right posterior iliac bone with both aspirate and core samples obtained.   Electronically Signed   By: Aletta Edouard M.D.   On: 07/26/2013 11:00   Dg  Chest Port 1 View  08/18/2013   CLINICAL DATA:  Shortness of breath.  EXAM: PORTABLE CHEST - 1 VIEW  COMPARISON:  CT CHEST W/CM dated 07/02/2013; DG CHEST 2 VIEW dated 06/28/2013  FINDINGS: The lungs are well-aerated. A left-sided dual lumen catheter is seen ending about the cavoatrial junction. A right-sided chest port is seen ending about the mid to distal SVC. There is no evidence of focal opacification, pleural effusion or pneumothorax.  The cardiomediastinal silhouette is borderline normal in size. No acute osseous abnormalities are seen.  IMPRESSION: No acute cardiopulmonary process seen.   Electronically Signed   By: Garald Balding M.D.   On: 08/18/2013 23:28      A/P: 43 y.o. male admitted with fever chills and yellow productive cough, in the setting of recent chemotherapy for multiple myeloma, the patient is being treated [facility], receivinga combination of  etoposide 374m/m2 daily for 2 days, followed by G-CSF at a dose of 960 mcg once daily (10 mcg/kg/day). Day 1 was 08/15/2013 of etoposide treatment. He was to begin  Levofloxacin prophylaxis will begin on day 5 of therapy. He received 2 doses of Neupogen and 960 mcg injection daily,x2, off a total of 7 planned.his admission white count was 20, today at 14. the was scheduled for a total of stem cell transplant. We were kindly requested to evaluate the patient. We have spoken to ONorth Irwin number [phone] [facility] were to continue to be hospitalized, do to his fragile overall status, do to recent chemotherapy, and the need for high doses of Neupogen, currently at 960 mcg daily, without significant improvement in the white count,which of note, has decreased since admission from the 20-14 Dr.     is to see the patient following this consult with recommendations regarding diagnosis, treatment options and further workup studies. Addendum to this note to be written. Thank you  for the referral.  WRondel Jumbo PA-C 08/19/2013 10:09 AM    ADDENDUM: I agree with the above note by SClarise Cruz This is all could be from the high-dose etoposide that he received. Is not a common to see fever, pulmonary issues from high-dose etoposide.  He had a fantastic response to aggressive chemotherapy. His recent bone marrow shows normalization with 2% plasma cells and normalization of his cytogenetics. He had initial cytogenetics that were quite poor and indicated an aggressive nature for his myeloma. Because of this response, we really have a limited window of opportunity to get him transplanted to eradicate minimal residual disease.   I have him on the the high dose Neupogen. He'll be on 960 mcg daily. He must have this so that his stem cells can be mobilized.  I truly cannot argue with broad-spectrum antibiotics.  [facility] I would have no problems with this.  I does want to make sure that we support him aggressively while he is up here so that he does not have any further delays with his stem cell mobilization and transplant.  Mr. KWalshhas a fairly aggressive myeloma. Again, he really needs to have high-dose therapy with stem cell support in order to have the best chance of long-term remission.  On my exam, I cannot find anything focal. His lungs sound good. Good air movement. He has no mucositis. I find no rashes. Abdomen is soft. Bowel sounds are present.  He has body aches from the Neupogen. I'll be aggressive with pain medication so that he will be comfortable.  I very much appreciate the outstanding care from the hospitalist and the staff on 6 E.   Mark E.  1 Cor 13:13

## 2013-08-19 NOTE — ED Provider Notes (Signed)
CSN: [id number]     Arrival date & time 08/18/13  2153 History   First MD Initiated Contact with Patient 08/18/13 2308     Chief Complaint  Patient presents with  . Generalized Body Aches  . Fever   (Consider location/radiation/quality/duration/timing/severity/associated sxs/prior Treatment) HPI Comments: Pt with hx of multiple myeloma, undergoing chemo, comes in with cc of fevers, body aches, chills. Pt reports that his sx started yday. He also had a catheter placed a week ago, last chemo session was just few days back. Pt reports that he started having fevers y'day. He also has generalized body aches and chills, and nausea - but no emesis. Pt has no cough.   Patient is a 43 y.o. male presenting with fever. The history is provided by the patient.  Fever Associated symptoms: chills, myalgias and nausea   Associated symptoms: no chest pain, no confusion, no cough, no dysuria, no headaches and no rash     Past Medical History  Diagnosis Date  . History of radiation therapy 02/07/13- 02/26/13    lower L spine, upper sacrum, 35 gray in 14 fractions  . Cancer   . Multiple myeloma dx'd 01/2013   Past Surgical History  Procedure Laterality Date  . Portacath placement     Family History  Problem Relation Age of Onset  . Diabetic kidney disease Mother   . Hypertension Mother   . Heart attack Mother   . Kidney failure Mother   . Coronary artery disease Mother   . HIV Father    History  Substance Use Topics  . Smoking status: Former Smoker -- 1.00 packs/day for 15 years    Types: Cigarettes    Quit date: 06/29/2003  . Smokeless tobacco: Never Used  . Alcohol Use: No    Review of Systems  Constitutional: Positive for fever, chills and fatigue. Negative for activity change.  Eyes: Negative for visual disturbance.  Respiratory: Negative for cough, chest tightness and shortness of breath.   Cardiovascular: Negative for chest pain.  Gastrointestinal: Positive for nausea. Negative for  abdominal distention.  Genitourinary: Negative for dysuria, enuresis and difficulty urinating.  Musculoskeletal: Positive for myalgias. Negative for arthralgias and neck pain.  Skin: Negative for rash.  Allergic/Immunologic: Positive for immunocompromised state.  Neurological: Negative for dizziness, light-headedness and headaches.  Hematological: Does not bruise/bleed easily.  Psychiatric/Behavioral: Negative for confusion.    Allergies  Review of patient's allergies indicates no known allergies.  Home Medications   Current Outpatient Rx  Name  Route  Sig  Dispense  Refill  . docusate sodium (COLACE) 100 MG capsule   Oral   Take 200 mg by mouth 2 (two) times daily.         . famciclovir (FAMVIR) 500 MG tablet   Oral   Take 0.5 tablets (250 mg total) by mouth daily.   30 tablet   6   . famotidine (PEPCID) 20 MG tablet   Oral   Take 1 tablet (20 mg total) by mouth 2 (two) times daily.   60 tablet   0   . oxyCODONE (OXY IR/ROXICODONE) 5 MG immediate release tablet      Take 1-2 pillls, IF NEEDED, every 4 hrs for pain.   90 tablet   0   . pyridOXINE (VITAMIN B-6) 100 MG tablet   Oral   Take 100 mg by mouth 2 (two) times daily.          BP 113/58  Pulse 106  Temp(Src) 100 F (  37.8 C) (Oral)  Resp 16  Wt 217 lb (98.431 kg)  SpO2 98% Physical Exam  Nursing note and vitals reviewed. Constitutional: He is oriented to person, place, and time. He appears well-developed.  HENT:  Head: Normocephalic and atraumatic.  Eyes: Conjunctivae and EOM are normal. Pupils are equal, round, and reactive to light.  Neck: Normal range of motion. Neck supple.  Cardiovascular: Regular rhythm.   Pulmonary/Chest: Effort normal and breath sounds normal.  Abdominal: Soft. Bowel sounds are normal. He exhibits no distension. There is no tenderness. There is no rebound and no guarding.  Neurological: He is alert and oriented to person, place, and time.  Skin: Skin is warm.    ED  Course  Procedures (including critical care time) Labs Review Labs Reviewed  CBC WITH DIFFERENTIAL - Abnormal; Notable for the following:    WBC 20.4 (*)    RBC 3.38 (*)    Hemoglobin 10.5 (*)    HCT 31.0 (*)    Platelets 126 (*)    Neutrophils Relative % 97 (*)    Lymphocytes Relative 3 (*)    Monocytes Relative 0 (*)    Neutro Abs 19.8 (*)    Lymphs Abs 0.6 (*)    Monocytes Absolute 0.0 (*)    All other components within normal limits  COMPREHENSIVE METABOLIC PANEL - Abnormal; Notable for the following:    Potassium 3.4 (*)    Glucose, Bld 122 (*)    Calcium 7.7 (*)    GFR calc non Af Amer 71 (*)    GFR calc Af Amer 82 (*)    All other components within normal limits  URINALYSIS, ROUTINE W REFLEX MICROSCOPIC - Abnormal; Notable for the following:    Urobilinogen, UA 2.0 (*)    All other components within normal limits  CULTURE, BLOOD (ROUTINE X 2)  CULTURE, BLOOD (ROUTINE X 2)  URINE CULTURE  INFLUENZA PANEL BY PCR (TYPE A & B, H1N1)  CG4 I-STAT (LACTIC ACID)  POCT LACTIC ACID (LACTATE)   Imaging Review Dg Chest Port 1 View  08/18/2013   CLINICAL DATA:  Shortness of breath.  EXAM: PORTABLE CHEST - 1 VIEW  COMPARISON:  CT CHEST W/CM dated 07/02/2013; DG CHEST 2 VIEW dated 06/28/2013  FINDINGS: The lungs are well-aerated. A left-sided dual lumen catheter is seen ending about the cavoatrial junction. A right-sided chest port is seen ending about the mid to distal SVC. There is no evidence of focal opacification, pleural effusion or pneumothorax.  The cardiomediastinal silhouette is borderline normal in size. No acute osseous abnormalities are seen.  IMPRESSION: No acute cardiopulmonary process seen.   Electronically Signed   By: Garald Balding M.D.   On: 08/18/2013 23:28    EKG Interpretation   None       MDM  No diagnosis found.  Pt comes in with cc of fevers. Pt has flu like symptoms, but with that he is immunocompromised and recent cath placement. Exam is pretty  benign, besides tachycardia. Labs show leukocytosis with bandemia. Pt has 3/4 SIRs with no lactate elevation. Bandemia could be due to cancer/chemo - but given recent instrumentation, his fevers, chills could be due to bacteremia as well. Pt is cultured, and we will start tamiflu and vanc and cef.  Varney Biles, MD 08/19/13 0140

## 2013-08-19 NOTE — Progress Notes (Signed)
ANTIBIOTIC CONSULT NOTE - INITIAL  Pharmacy Consult for vancomycin Indication: Fever, sepsis  No Known Allergies  Patient Measurements: Height: 5' 10.87" (180 cm) Weight: 217 lb (98.431 kg) IBW/kg (Calculated) : 74.99 Adjusted Body Weight:   Vital Signs: Temp: 100.1 F (37.8 C) (01/19 0453) Temp src: Oral (01/19 0453) BP: 113/70 mmHg (01/19 0453) Pulse Rate: 107 (01/19 0453) Intake/Output from previous day: 01/18 0701 - 01/19 0700 In: -  Out: 550 [Urine:550] Intake/Output from this shift: Total I/O In: -  Out: 550 [Urine:550]  Labs:  Recent Labs  08/18/13 2300 08/19/13 0430  WBC 20.4* 14.0*  HGB 10.5* 9.0*  PLT 126* 97*  CREATININE 1.24 1.05   Estimated Creatinine Clearance: 110.5 ml/min (by C-G formula based on Cr of 1.05). No results found for this basename: VANCOTROUGH, VANCOPEAK, VANCORANDOM, GENTTROUGH, GENTPEAK, GENTRANDOM, TOBRATROUGH, TOBRAPEAK, TOBRARND, AMIKACINPEAK, AMIKACINTROU, AMIKACIN,  in the last 72 hours   Microbiology: No results found for this or any previous visit (from the past 720 hour(s)).  Medical History: Past Medical History  Diagnosis Date  . History of radiation therapy 02/07/13- 02/26/13    lower L spine, upper sacrum, 35 gray in 14 fractions  . Cancer   . Multiple myeloma dx'd 01/2013    Medications:  Anti-infectives   Start     Dose/Rate Route Frequency Ordered Stop   08/19/13 1000  famciclovir (FAMVIR) tablet 250 mg     250 mg Oral Daily 08/19/13 0331     08/19/13 1000  oseltamivir (TAMIFLU) capsule 75 mg     75 mg Oral 2 times daily 08/19/13 0551     08/19/13 0800  ceFEPIme (MAXIPIME) 1 g in dextrose 5 % 50 mL IVPB     1 g 100 mL/hr over 30 Minutes Intravenous Every 8 hours 08/19/13 0331 08/27/13 0759   08/19/13 0145  oseltamivir (TAMIFLU) capsule 75 mg     75 mg Oral  Once 08/19/13 0133 08/19/13 0238   08/19/13 0145  vancomycin (VANCOCIN) IVPB 1000 mg/200 mL premix     1,000 mg 200 mL/hr over 60 Minutes Intravenous   Once 08/19/13 0134 08/19/13 0416   08/19/13 0145  ceFEPIme (MAXIPIME) 1 g in dextrose 5 % 50 mL IVPB  Status:  Discontinued     1 g 100 mL/hr over 30 Minutes Intravenous Every 12 hours 08/19/13 0134 08/19/13 0449     Assessment: Patient with fever and sepsis.  First dose of antibiotics already given.  Goal of Therapy:  Vancomycin trough level 15-20 mcg/ml Cefepime/tamiflu dosed based on patient weight and renal function   Plan:   Vancomycin 1gm iv q8hr Cefepime 1gm iv q8hr Tamiflu 75mg po bid  Grimsley Jr, Julian Crowford 08/19/2013,5:52 AM  

## 2013-08-19 NOTE — Progress Notes (Signed)
[facility] PROGRESS NOTE  Mark [name] TDH:[id number] DOB: [birth year] DOA: 08/18/2013 PCP: No PCP Per Patient  Assessment/Plan: 1. Fever, Flu like symptoms: Continue with Tamiflu. Cover for PNA due to immune compromise status. Follow blood culture. Legionella negative, strep negative. Influenza panel pending.  2. MM: received Chem[facility] at [facility] 1-15, and 1-16. On Neupogen at home. Oncologist consulted.   Code Status: Full Code.  Family Communication: Care discussed with patient.  Disposition Plan: Remain inpatient.    Consultants:  Dr Marin Olp.   Procedures: None  Antibiotics:  Cefepime 1-18.  Vancomycin 1-18.   Tamiflu 1-19  HPI/Subjective: Feeling better than yesterday. Still coughing. No worsening dyspnea. He does relates sore throat, myalgia, runny nose.   Objective: Filed Vitals:   08/19/13 0453  BP: 113/70  Pulse: 107  Temp: 100.1 F (37.8 C)  Resp: 20    Intake/Output Summary (Last 24 hours) at 08/19/13 0853 Last data filed at 08/19/13 0541  Gross per 24 hour  Intake      0 ml  Output    550 ml  Net   -550 ml   Filed Weights   08/18/13 2215  Weight: 98.431 kg (217 lb)    Exam:   General:  No acute distress.   Cardiovascular: S 1, S 2 RRR  Respiratory: few ronchus.   Abdomen: Bs present, soft, NT  Musculoskeletal: no edema.   Data Reviewed: Basic Metabolic Panel:  Recent Labs Lab 08/18/13 2300 08/19/13 0430  NA 139 139  K 3.4* 3.6*  CL 100 104  CO2 24 22  GLUCOSE 122* 149*  BUN 14 13  CREATININE 1.24 1.05  CALCIUM 7.7* 6.8*   Liver Function Tests:  Recent Labs Lab 08/18/13 2300 08/19/13 0430  AST 17 16  ALT 15 12  ALKPHOS 84 71  BILITOT 1.1 0.8  PROT 6.6 5.6*  ALBUMIN 3.9 3.2*   No results found for this basename: LIPASE, AMYLASE,  in the last 168 hours No results found for this basename: AMMONIA,  in the last 168 hours CBC:  Recent Labs Lab 08/18/13 2300 08/19/13 0430  WBC 20.4* 14.0*  NEUTROABS  19.8* 13.6*  HGB 10.5* 9.0*  HCT 31.0* 26.4*  MCV 91.7 91.7  PLT 126* 97*   Cardiac Enzymes: No results found for this basename: CKTOTAL, CKMB, CKMBINDEX, TROPONINI,  in the last 168 hours BNP (last 3 results) No results found for this basename: PROBNP,  in the last 8760 hours CBG: No results found for this basename: GLUCAP,  in the last 168 hours  No results found for this or any previous visit (from the past 240 hour(s)).   Studies: Dg Chest Port 1 View  08/18/2013   CLINICAL DATA:  Shortness of breath.  EXAM: PORTABLE CHEST - 1 VIEW  COMPARISON:  CT CHEST W/CM dated 07/02/2013; DG CHEST 2 VIEW dated 06/28/2013  FINDINGS: The lungs are well-aerated. A left-sided dual lumen catheter is seen ending about the cavoatrial junction. A right-sided chest port is seen ending about the mid to distal SVC. There is no evidence of focal opacification, pleural effusion or pneumothorax.  The cardiomediastinal silhouette is borderline normal in size. No acute osseous abnormalities are seen.  IMPRESSION: No acute cardiopulmonary process seen.   Electronically Signed   By: Garald Balding M.D.   On: 08/18/2013 23:28    Scheduled Meds: . sodium chloride   Intravenous STAT  . ceFEPime (MAXIPIME) IV  1 g Intravenous Q8H  . docusate sodium  200 mg Oral BID  .  famciclovir  250 mg Oral Daily  . famotidine  20 mg Oral BID  . heparin  5,000 Units Subcutaneous Q8H  . oseltamivir  75 mg Oral BID  . pyridOXINE  100 mg Oral BID  . vancomycin  1,000 mg Intravenous Q8H   Continuous Infusions:   Principal Problem:   Flu Active Problems:   Multiple myeloma   Sepsis    Time spent: 35 minutes.     Mark [name]  [facility] Pager [phone]. If 7PM-7AM, please contact night-coverage at [email or web address], password [facility] 08/19/2013, 8:53 AM  LOS: 1 day

## 2013-08-19 NOTE — H&P (Signed)
[facility] History and Physical  Patient: Mark [name]  PQZ:[id number]  DOB: 1971/05/05  DOS: the patient was seen and examined on 08/19/2013 PCP: No PCP Per Patient  Chief Complaint: Fever  HPI: Mark [name] is a 43 y.o. male with Past medical history of multiple myeloma, currently undergoing chemotherapy mobilization with a combination of etopside for 2 days followed by Neupogen at 966mg/day, scheduled for autologous stem cell transplant. The patient is coming from home. The pt presented with complaints of generalized body ache, fever, chills, nasal and chest congestion, corrected chest pain, cough with yellow expectoration ongoing since last one day. Denies any sick contacts. He has taken Neupogen for 2 days. He was also recommended to start taking levofloxacin on day 5 his chemotherapy that would be tomorrow.  Last week she has undergone placement of a [facility] at [facility] He still has stitches at the site and was recommended to visit there to remove them on Thursday. He has some pain on movement of the shoulder ever since the procedure is done but no redness or warmth or swelling. Pt denies any orthopnea, PND, nausea, vomiting, abdominal pain, diarrhea, constipation, active bleeding, burning urination, dizziness, pedal edema,  focal neurological deficit.   Review of Systems: as mentioned in the history of present illness.  A Comprehensive review of the other systems is negative.  Past Medical History  Diagnosis Date  . History of radiation therapy 02/07/13- 02/26/13    lower L spine, upper sacrum, 35 gray in 14 fractions  . Cancer   . Multiple myeloma dx'd 01/2013   Past Surgical History  Procedure Laterality Date  . Portacath placement     Social History:  reports that he quit smoking about 10 years ago. His smoking use included Cigarettes. He has a 15 pack-year smoking history. He has never used smokeless tobacco. He reports that he does not drink alcohol or use illicit  drugs. Independent for most of his  ADL.  No Known Allergies  Family History  Problem Relation Age of Onset  . Diabetic kidney disease Mother   . Hypertension Mother   . Heart attack Mother   . Kidney failure Mother   . Coronary artery disease Mother   . HIV Father     Prior to Admission medications   Medication Sig Start Date End Date Taking? Authorizing Provider  docusate sodium (COLACE) 100 MG capsule Take 200 mg by mouth 2 (two) times daily.   Yes Historical Provider, MD  famciclovir (FAMVIR) 500 MG tablet Take 0.5 tablets (250 mg total) by mouth daily. 07/26/13  Yes PVolanda Napoleon MD  famotidine (PEPCID) 20 MG tablet Take 1 tablet (20 mg total) by mouth 2 (two) times daily. 02/26/13  Yes AModena Jansky MD  oxyCODONE (OXY IR/ROXICODONE) 5 MG immediate release tablet Take 1-2 pillls, IF NEEDED, every 4 hrs for pain. 07/26/13  Yes PVolanda Napoleon MD  pyridOXINE (VITAMIN B-6) 100 MG tablet Take 100 mg by mouth 2 (two) times daily.   Yes Historical Provider, MD    Physical Exam: Filed Vitals:   08/19/13 0300 08/19/13 0336 08/19/13 0400 08/19/13 0453  BP: 114/63  109/52 113/70  Pulse: 102  102 107  Temp:    100.1 F (37.8 C)  TempSrc:    Oral  Resp:    20  Height:  5' 10.87" (1.8 m)    Weight:      SpO2: 95%  97% 95%    General: Alert, Awake and Oriented to Time,  Place and Person. Appear in mild distress Eyes: PERRL ENT: Oral Mucosa clear moist. Neck: no JVD Cardiovascular: S1 and S2 Present, no Murmur, Peripheral Pulses Present Respiratory: Bilateral Air entry equal and Decreased, Clear to Auscultation,  no Crackles,no wheezes Abdomen: Bowel Sound Present, Soft and Non tender Skin: no Rash Extremities: no Pedal edema, no calf tenderness Neurologic: Grossly Unremarkable.  Labs on Admission:  CBC:  Recent Labs Lab 08/18/13 2300 08/19/13 0430  WBC 20.4* 14.0*  NEUTROABS 19.8* PENDING  HGB 10.5* 9.0*  HCT 31.0* 26.4*  MCV 91.7 91.7  PLT 126* 97*     CMP     Component Value Date/Time   NA 139 08/19/2013 0430   NA 140 07/26/2013 1325   NA 145 02/21/2013 0908   K 3.6* 08/19/2013 0430   K 4.2 07/26/2013 1325   K 3.8 02/21/2013 0908   CL 104 08/19/2013 0430   CL 104 07/26/2013 1325   CO2 22 08/19/2013 0430   CO2 29 07/26/2013 1325   CO2 32* 02/21/2013 0908   GLUCOSE 149* 08/19/2013 0430   GLUCOSE 102 07/26/2013 1325   GLUCOSE 115 02/21/2013 0908   BUN 13 08/19/2013 0430   BUN 14 07/26/2013 1325   BUN 19.5 02/21/2013 0908   CREATININE 1.05 08/19/2013 0430   CREATININE 1.2 07/26/2013 1325   CREATININE 1.5* 02/21/2013 0908   CALCIUM 6.8* 08/19/2013 0430   CALCIUM 8.8 07/26/2013 1325   CALCIUM 17.7 Repeated and Verified* 02/21/2013 0908   CALCIUM >15.0* 02/01/2013 1230   PROT 5.6* 08/19/2013 0430   PROT 6.5 07/26/2013 1325   ALBUMIN 3.2* 08/19/2013 0430   AST 16 08/19/2013 0430   AST 23 07/26/2013 1325   ALT 12 08/19/2013 0430   ALT 18 07/26/2013 1325   ALKPHOS 71 08/19/2013 0430   ALKPHOS 84 07/26/2013 1325   BILITOT 0.8 08/19/2013 0430   BILITOT 0.70 07/26/2013 1325   GFRNONAA 87* 08/19/2013 0430   GFRAA >90 08/19/2013 0430    No results found for this basename: LIPASE, AMYLASE,  in the last 168 hours No results found for this basename: AMMONIA,  in the last 168 hours  No results found for this basename: CKTOTAL, CKMB, CKMBINDEX, TROPONINI,  in the last 168 hours BNP (last 3 results) No results found for this basename: PROBNP,  in the last 8760 hours  Radiological Exams on Admission: Dg Chest Port 1 View  08/18/2013   CLINICAL DATA:  Shortness of breath.  EXAM: PORTABLE CHEST - 1 VIEW  COMPARISON:  CT CHEST W/CM dated 07/02/2013; DG CHEST 2 VIEW dated 06/28/2013  FINDINGS: The lungs are well-aerated. A left-sided dual lumen catheter is seen ending about the cavoatrial junction. A right-sided chest port is seen ending about the mid to distal SVC. There is no evidence of focal opacification, pleural effusion or pneumothorax.  The  cardiomediastinal silhouette is borderline normal in size. No acute osseous abnormalities are seen.  IMPRESSION: No acute cardiopulmonary process seen.   Electronically Signed   By: Garald Balding M.D.   On: 08/18/2013 23:28    Assessment/Plan Principal Problem:   Flu Active Problems:   Multiple myeloma   Sepsis   1. Flu The patient is and symptoms of influenza with upper respiratory congestion. His chest x-ray is negative for pneumonia. He has fever and leukocytosis but he is on neupogen. He also has recently undergone placement of TDC. He recently had chemotherapy etoposide 2 courses of 15 and 16 January. With this he is at high risk  systemic infection. He has been given Tamiflu, IV vancomycin and IV cefepime which I would continue. I would follow blood culture, obtain sputum culture, urine antigens, influenza PCR. Been given IV fluids for resuscitation.  2. Chronic pain OxyIR as needed  3. Multiple myeloma Patient is currently undergoing chemo-mobilization. She is supposed to get Neupogen daily until Thursday and then followup with [facility] for bone marrow biopsy. In the setting of acute infection were discussed with hematology before providing Neupogen. His routine doses 960 mcg daily.  4. GERD Continue Pepcid  DVT Prophylaxis: subcutaneous Heparin Nutrition: Regular diet  Code Status: Full  Disposition: Admitted to inpatient in med-surge unit.  Author: Berle Mull, MD [facility] Pager: [phone] 08/19/2013, 5:16 AM    If 7PM-7AM, please contact night-coverage [email or web address] Password [email or web address]

## 2013-08-20 LAB — URINE CULTURE
Colony Count: NO GROWTH
Culture: NO GROWTH

## 2013-08-20 LAB — CBC
HCT: 26.6 % — ABNORMAL LOW (ref 39.0–52.0)
Hemoglobin: 9.1 g/dL — ABNORMAL LOW (ref 13.0–17.0)
MCH: 31.2 pg (ref 26.0–34.0)
MCHC: 34.2 g/dL (ref 30.0–36.0)
MCV: 91.1 fL (ref 78.0–100.0)
PLATELETS: 78 10*3/uL — AB (ref 150–400)
RBC: 2.92 MIL/uL — ABNORMAL LOW (ref 4.22–5.81)
RDW: 14.8 % (ref 11.5–15.5)
WBC: 7.6 10*3/uL (ref 4.0–10.5)

## 2013-08-20 LAB — BASIC METABOLIC PANEL
BUN: 10 mg/dL (ref 6–23)
CO2: 24 mEq/L (ref 19–32)
Calcium: 7.8 mg/dL — ABNORMAL LOW (ref 8.4–10.5)
Chloride: 104 mEq/L (ref 96–112)
Creatinine, Ser: 1.01 mg/dL (ref 0.50–1.35)
Glucose, Bld: 193 mg/dL — ABNORMAL HIGH (ref 70–99)
POTASSIUM: 4.2 meq/L (ref 3.7–5.3)
Sodium: 140 mEq/L (ref 137–147)

## 2013-08-20 NOTE — Progress Notes (Signed)
I notified NP Knorr about pt temp of 101.8 and no PRN medication for fever ordered. Pt refused to take to tylenol. He stated that he was told that he cannot take Ibuprofen and Tylenol due his platelet count. Pt has myeloma. Decreased temp in pt room. Removed additional blankets. Will continue to monitor.

## 2013-08-20 NOTE — Progress Notes (Signed)
[facility] PROGRESS NOTE  Mark [name] ZXY:[id number] DOB: 10-17-1970 DOA: 08/18/2013 PCP: No PCP Per Patient  Assessment/Plan: 1. Fever, Flu : Positive for influenza A and better on Tamiflu which we will continue . Stop empiric antibiotics, monitor cultures.      2. MM: received Chemotherapy at [facility] 1-15, and 1-16. On Neupogen at home. Discussed with oncologist on call Dr. Marin Olp in detail, no need for transfer to [facility] at this point. If afebrile and stable will be discharged home in the morning with outpatient followup at [facility].     Code Status: Full Code.  Family Communication: Care discussed with patient.  Disposition Plan: Remain inpatient.    Consultants:  Dr Marin Olp.   Procedures: None  Antibiotics:  Cefepime 1-18.  Vancomycin 1-18.   Tamiflu 1-19  HPI/Subjective: Feeling better than yesterday. Still coughing. No worsening dyspnea. He does relates sore throat, myalgia, runny nose.   Objective: Filed Vitals:   08/20/13 1018  BP: 140/87  Pulse: 105  Temp: 99.4 F (37.4 C)  Resp: 18    Intake/Output Summary (Last 24 hours) at 08/20/13 1302 Last data filed at 08/20/13 0900  Gross per 24 hour  Intake 943.33 ml  Output      0 ml  Net 943.33 ml   Filed Weights   08/18/13 2215  Weight: 98.431 kg (217 lb)    Exam:   General:  No acute distress.   Cardiovascular: S 1, S 2 RRR  Respiratory: few ronchus.   Abdomen: Bs present, soft, NT  Musculoskeletal: no edema.   Data Reviewed: Basic Metabolic Panel:  Recent Labs Lab 08/18/13 2300 08/19/13 0430 08/20/13 0600  NA 139 139 140  K 3.4* 3.6* 4.2  CL 100 104 104  CO2 '24 22 24  ' GLUCOSE 122* 149* 193*  BUN '14 13 10  ' CREATININE 1.24 1.05 1.01  CALCIUM 7.7* 6.8* 7.8*   Liver Function Tests:  Recent Labs Lab 08/18/13 2300 08/19/13 0430  AST 17 16  ALT 15 12  ALKPHOS 84 71  BILITOT 1.1 0.8  PROT 6.6 5.6*  ALBUMIN 3.9 3.2*   No results found for this basename: LIPASE,  AMYLASE,  in the last 168 hours No results found for this basename: AMMONIA,  in the last 168 hours CBC:  Recent Labs Lab 08/18/13 2300 08/19/13 0430 08/20/13 0600  WBC 20.4* 14.0* 7.6  NEUTROABS 19.8* 13.6*  --   HGB 10.5* 9.0* 9.1*  HCT 31.0* 26.4* 26.6*  MCV 91.7 91.7 91.1  PLT 126* 97* 78*   Cardiac Enzymes: No results found for this basename: CKTOTAL, CKMB, CKMBINDEX, TROPONINI,  in the last 168 hours BNP (last 3 results) No results found for this basename: PROBNP,  in the last 8760 hours CBG: No results found for this basename: GLUCAP,  in the last 168 hours  Recent Results (from the past 240 hour(s))  CULTURE, BLOOD (ROUTINE X 2)     Status: None   Collection Time    08/18/13 10:44 PM      Result Value Range Status   Specimen Description BLOOD PORTA CATH   Final   Special Requests BOTTLES DRAWN AEROBIC AND ANAEROBIC 5CC   Final   Culture  Setup Time     Final   Value: 08/19/2013 08:52     Performed at [facility]   Culture     Final   Value:        BLOOD CULTURE RECEIVED NO GROWTH TO DATE CULTURE WILL BE HELD  FOR 5 DAYS BEFORE ISSUING A FINAL NEGATIVE REPORT     Performed at [facility]   Report Status PENDING   Incomplete  CULTURE, BLOOD (ROUTINE X 2)     Status: None   Collection Time    08/18/13 10:49 PM      Result Value Range Status   Specimen Description BLOOD RIGHT ANTECUBITAL   Final   Special Requests BOTTLES DRAWN AEROBIC AND ANAEROBIC 2CC   Final   Culture  Setup Time     Final   Value: 08/19/2013 08:53     Performed at [facility]   Culture     Final   Value:        BLOOD CULTURE RECEIVED NO GROWTH TO DATE CULTURE WILL BE HELD FOR 5 DAYS BEFORE ISSUING A FINAL NEGATIVE REPORT     Performed at [facility]   Report Status PENDING   Incomplete  URINE CULTURE     Status: None   Collection Time    08/18/13 10:51 PM      Result Value Range Status   Specimen Description URINE, CLEAN CATCH   Final   Special Requests  NONE   Final   Culture  Setup Time     Final   Value: 08/19/2013 09:43     Performed at [facility]     Final   Value: NO GROWTH     Performed at [facility]   Culture     Final   Value: NO GROWTH     Performed at [facility]   Report Status 08/20/2013 FINAL   Final  CULTURE, EXPECTORATED SPUTUM-ASSESSMENT     Status: None   Collection Time    08/19/13  8:18 AM      Result Value Range Status   Specimen Description SPUTUM   Final   Special Requests NONE   Final   Sputum evaluation     Final   Value: MICROSCOPIC FINDINGS SUGGEST THAT THIS SPECIMEN IS NOT REPRESENTATIVE OF LOWER RESPIRATORY SECRETIONS. PLEASE RECOLLECT.     CALLED TO OWEN RN AT [phone] BY TIBBITTS,KELLY   Report Status 08/19/2013 FINAL   Final     Studies: Dg Chest Port 1 View  08/18/2013   CLINICAL DATA:  Shortness of breath.  EXAM: PORTABLE CHEST - 1 VIEW  COMPARISON:  CT CHEST W/CM dated 07/02/2013; DG CHEST 2 VIEW dated 06/28/2013  FINDINGS: The lungs are well-aerated. A left-sided dual lumen catheter is seen ending about the cavoatrial junction. A right-sided chest port is seen ending about the mid to distal SVC. There is no evidence of focal opacification, pleural effusion or pneumothorax.  The cardiomediastinal silhouette is borderline normal in size. No acute osseous abnormalities are seen.  IMPRESSION: No acute cardiopulmonary process seen.   Electronically Signed   By: Garald Balding M.D.   On: 08/18/2013 23:28    Scheduled Meds: . ceFEPime (MAXIPIME) IV  1 g Intravenous Q8H  . docusate sodium  200 mg Oral BID  . enoxaparin (LOVENOX) injection  40 mg Subcutaneous Q24H  . famciclovir  500 mg Oral Daily  . famotidine  20 mg Oral BID  . filgrastim  960 mcg Subcutaneous Daily  . fluconazole  200 mg Oral Daily  . oseltamivir  75 mg Oral BID  . pyridOXINE  100 mg Oral BID  . sodium chloride  10-40 mL Intracatheter Q12H  . vancomycin  1,000 mg Intravenous Q8H  Continuous Infusions:   Principal Problem:   Flu Active Problems:   Multiple myeloma   Sepsis    Time spent: 35 minutes.     [facility] Pager [phone]. If 7PM-7AM, please contact night-coverage at [email or web address], password [facility] 08/20/2013, 1:02 PM  LOS: 2 days

## 2013-08-20 NOTE — Progress Notes (Signed)
Mr. Rajewski has influenza A. He now is on Tamiflu. This i certainly would  explain his symptoms. He continues on IV antibiotics. I would keep him on these for right now.  He is coughing up some slight purulent appearing sputum. There is no blood with this.  He says temperature broke last night. He has some sweats.  He's not bothered by the myalgias or arthralgias now.  His white cell count is coming down now. He.is Neupogen dose last night. His white cell count now 7.6.  He's had no nausea vomiting. There's no diarrhea.  His vital signs are temperature of 99.5. Pulse 103. Blood pressure 118/73. Oral exam shows no mucositis. Ocular exam shows no scleral icterus. There is no adenopathy in the neck. Lungs are clear bilaterally. He may have some scattered crackles bilaterally. Cardiac exam is slightly tachycardic but regular. He has no murmurs rubs or bruits. Abdomen is soft. There is good bowel sounds. Extremities shows no clubbing cyanosis or edema. Skin exam no rashes echo was or petechia. Neurological exam no focal neurological deficits.  Labs show a white cell count 7.6. Hemoglobin 9.1. Platelet count is pending. Potassium 4.2. Sodium 140. Creatinine 1. Glucose 193.  Again, looks like he has influenza A. He should begin to respond to the Tamiflu. Again I would keep him on systemic antibiotics to help minimize superinfection.  I would try to transfer him down to couple now. He is supposed to see them on Friday.  Again, his white cell count continued to drop. I'm sure that his platelets are on the low side but he does not need to be transfused.  I'm glad that we found that he had influenza A. we should be able to manage this pretty easily.  Again, we should get him down to [facility] today or tomorrow so that continuity can be maintained and he can proceed with transplant preparations.  Pete E.  Ephesians 4:4

## 2013-08-21 ENCOUNTER — Telehealth: Payer: Self-pay | Admitting: Radiation Oncology

## 2013-08-21 DIAGNOSIS — M549 Dorsalgia, unspecified: Secondary | ICD-10-CM

## 2013-08-21 DIAGNOSIS — N182 Chronic kidney disease, stage 2 (mild): Secondary | ICD-10-CM

## 2013-08-21 LAB — CBC WITH DIFFERENTIAL/PLATELET
BASOS PCT: 1 % (ref 0–1)
Basophils Absolute: 0 10*3/uL (ref 0.0–0.1)
Eosinophils Absolute: 0 10*3/uL (ref 0.0–0.7)
Eosinophils Relative: 2 % (ref 0–5)
HCT: 27.9 % — ABNORMAL LOW (ref 39.0–52.0)
HEMOGLOBIN: 9.4 g/dL — AB (ref 13.0–17.0)
LYMPHS ABS: 0.4 10*3/uL — AB (ref 0.7–4.0)
Lymphocytes Relative: 19 % (ref 12–46)
MCH: 30.7 pg (ref 26.0–34.0)
MCHC: 33.7 g/dL (ref 30.0–36.0)
MCV: 91.2 fL (ref 78.0–100.0)
MONO ABS: 0 10*3/uL — AB (ref 0.1–1.0)
Monocytes Relative: 1 % — ABNORMAL LOW (ref 3–12)
Neutro Abs: 1.5 10*3/uL — ABNORMAL LOW (ref 1.7–7.7)
Neutrophils Relative %: 77 % (ref 43–77)
PLATELETS: 59 10*3/uL — AB (ref 150–400)
RBC: 3.06 MIL/uL — AB (ref 4.22–5.81)
RDW: 14.8 % (ref 11.5–15.5)
WBC: 1.9 10*3/uL — ABNORMAL LOW (ref 4.0–10.5)

## 2013-08-21 LAB — BASIC METABOLIC PANEL
BUN: 9 mg/dL (ref 6–23)
CHLORIDE: 103 meq/L (ref 96–112)
CO2: 26 meq/L (ref 19–32)
CREATININE: 0.97 mg/dL (ref 0.50–1.35)
Calcium: 8.4 mg/dL (ref 8.4–10.5)
GFR calc Af Amer: >90 mL/min (ref >90)
GFR calc non Af Amer: >90 mL/min (ref >90)
Glucose, Bld: 82 mg/dL (ref 70–99)
Potassium: 4 mEq/L (ref 3.7–5.3)
Sodium: 142 mEq/L (ref 137–147)

## 2013-08-21 MED ORDER — FLUCONAZOLE 100 MG PO TABS
100.0000 mg | ORAL_TABLET | Freq: Every day | ORAL | Status: DC
Start: 2013-08-21 — End: 2013-12-19

## 2013-08-21 MED ORDER — OSELTAMIVIR PHOSPHATE 75 MG PO CAPS: 75.0000 mg | ORAL_CAPSULE | Freq: Two times a day (BID) | ORAL | Status: DC

## 2013-08-21 MED ORDER — LEVOFLOXACIN 500 MG PO TABS: 500.0000 mg | ORAL_TABLET | Freq: Every day | ORAL | Status: DC

## 2013-08-21 MED ORDER — LEVOFLOXACIN 500 MG PO TABS
500.0000 mg | ORAL_TABLET | Freq: Every day | ORAL | Status: DC
  Administered 2013-08-21: 500 mg via ORAL
  Filled 2013-08-21: qty 1

## 2013-08-21 MED ORDER — HEPARIN SOD (PORK) LOCK FLUSH 100 UNIT/ML IV SOLN
500.0000 [IU] | INTRAVENOUS | Status: AC | PRN
  Administered 2013-08-21: 500 [IU]

## 2013-08-21 MED ORDER — FLUCONAZOLE 100 MG PO TABS
100.0000 mg | ORAL_TABLET | Freq: Every day | ORAL | Status: DC
  Administered 2013-08-21: 100 mg via ORAL
  Filled 2013-08-21: qty 1

## 2013-08-21 NOTE — Telephone Encounter (Signed)
Faxed records to [facility].  OK per JDK.  Received confirmation.

## 2013-08-21 NOTE — Discharge Summary (Signed)
Physician Discharge Summary  Mark [name] XBO:[id number] DOB: 05-Feb-1971 DOA: 08/18/2013  PCP: No PCP Per Patient  Admit date: 08/18/2013 Discharge date: 08/21/2013  Time spent: 35 minutes  Recommendations for Outpatient Follow-up:  Follow up with PCP in 1-2 weeks Follow up with Oncology as scheduled  Discharge Diagnoses:  Principal Problem:   Flu Active Problems:   Multiple myeloma   Sepsis  Discharge Condition: Stable  Diet recommendation: Regular  Filed Weights   08/18/13 2215 08/21/13 0030  Weight: 98.431 kg (217 lb) 95.2 kg (209 lb 14.1 oz)   History of present illness:  Mark [name] is a 43 y.o. male with Past medical history of multiple myeloma, currently undergoing chemotherapy mobilization with a combination of etopside for 2 days followed by Neupogen at 967mg/day, scheduled for autologous stem cell transplant.  The patient is coming from home.  The pt presented with complaints of generalized body ache, fever, chills, nasal and chest congestion, corrected chest pain, cough with yellow expectoration ongoing since last one day. Denies any sick contacts. He has taken Neupogen for 2 days. He was also recommended to start taking levofloxacin on day 5 his chemotherapy that would be tomorrow.  Last week she has undergone [facility] He still has stitches at the site and was recommended to visit there to remove them on Thursday. He has some pain on movement of the shoulder ever since the procedure is done but no redness or warmth or swelling.  Pt denies any orthopnea, PND, nausea, vomiting, abdominal pain, diarrhea, constipation, active bleeding, burning urination, dizziness, pedal edema, focal neurological deficit.   Hospital Course:  1. Fever, Flu : Positive for influenza A and better on Tamiflu which we will continue . Stop empiric antibiotics, monitor cultures.  2. MM: [facility] and [facility]. On Neupogen at home. Had discussed with oncologist  on call Dr. EMarin Olpin detail, no need for transfer to [facility] at this point. WBC is expected to go down in the setting of chemo. Per Oncology, OK to d/c home today with close outpatient follow up.  Consultations:  Oncology  Discharge Exam: Filed Vitals:   08/20/13 1844 08/20/13 2341 08/21/13 0030 08/21/13 0528  BP: 110/64 113/74  135/71  Pulse: 101 94  89  Temp: 100 F (37.8 C) 99.7 F (37.6 C)  99.6 F (37.6 C)  TempSrc:  Oral  Oral  Resp: '16 20  20  ' Height:      Weight:   95.2 kg (209 lb 14.1 oz)   SpO2: 98% 97%  96%    General: awake, in nad Cardiovascular: regular, s1, s2 Respiratory: normal resp effort, no wheezing  Discharge Instructions     Medication List    ASK your doctor about these medications       docusate sodium 100 MG capsule  Commonly known as:  COLACE  Take 200 mg by mouth 2 (two) times daily.     famciclovir 500 MG tablet  Commonly known as:  FAMVIR  Take 0.5 tablets (250 mg total) by mouth daily.     famotidine 20 MG tablet  Commonly known as:  PEPCID  Take 1 tablet (20 mg total) by mouth 2 (two) times daily.     oxyCODONE 5 MG immediate release tablet  Commonly known as:  Oxy IR/ROXICODONE  Take 1-2 pillls, IF NEEDED, every 4 hrs for pain.     pyridOXINE 100 MG tablet  Commonly known as:  VITAMIN B-6  Take 100  mg by mouth 2 (two) times daily.       No Known Allergies    The results of significant diagnostics from this hospitalization (including imaging, microbiology, ancillary and laboratory) are listed below for reference.    Significant Diagnostic Studies: Ct Biopsy  07/26/2013   CLINICAL DATA:  Multiple myeloma and need for repeat bone marrow biopsy prior to stem cell harvesting for bone marrow transplant.  EXAM: CT BIOPSY  ANESTHESIA/SEDATION: Versed 2.0 mg IV, Fentanyl 200 mcg IV  Total Moderate Sedation Time  15 minutes.  PROCEDURE: The procedure risks, benefits, and alternatives were explained to the patient. Questions  regarding the procedure were encouraged and answered. The patient understands and consents to the procedure.  The right gluteal region was prepped with Betadine. Sterile gown and sterile gloves were used for the procedure. Local anesthesia was provided with 1% Lidocaine.  Under CT guidance, an 11 gauge OnControl bone cutting needle was advanced from a posterior approach into the right iliac bone. Needle positioning was confirmed with CT. Initial non heparinized and heparinized aspirate samples were obtained of bone marrow.  Core biopsy was performed via the core biopsy needle. Two separate core biopsy samples were obtained.  COMPLICATIONS: None  FINDINGS: Inspection of initial aspirate did reveal visible particles. Intact core biopsy samples were able to be obtained.  IMPRESSION: CT guided bone marrow biopsy of right posterior iliac bone with both aspirate and core samples obtained.   Electronically Signed   By: Aletta Edouard M.D.   On: 07/26/2013 11:00   Dg Chest Port 1 View  08/18/2013   CLINICAL DATA:  Shortness of breath.  EXAM: PORTABLE CHEST - 1 VIEW  COMPARISON:  CT CHEST W/CM dated 07/02/2013; DG CHEST 2 VIEW dated 06/28/2013  FINDINGS: The lungs are well-aerated. A left-sided dual lumen catheter is seen ending about the cavoatrial junction. A right-sided chest port is seen ending about the mid to distal SVC. There is no evidence of focal opacification, pleural effusion or pneumothorax.  The cardiomediastinal silhouette is borderline normal in size. No acute osseous abnormalities are seen.  IMPRESSION: No acute cardiopulmonary process seen.   Electronically Signed   By: Garald Balding M.D.   On: 08/18/2013 23:28    Microbiology: Recent Results (from the past 240 hour(s))  CULTURE, BLOOD (ROUTINE X 2)     Status: None   Collection Time    08/18/13 10:44 PM      Result Value Range Status   Specimen Description BLOOD PORTA CATH   Final   Special Requests BOTTLES DRAWN AEROBIC AND ANAEROBIC 5CC    Final   Culture  Setup Time     Final   Value: 08/19/2013 08:52     Performed at [facility]   Culture     Final   Value:        BLOOD CULTURE RECEIVED NO GROWTH TO DATE CULTURE WILL BE HELD FOR 5 DAYS BEFORE ISSUING A FINAL NEGATIVE REPORT     Performed at [facility]   Report Status PENDING   Incomplete  CULTURE, BLOOD (ROUTINE X 2)     Status: None   Collection Time    08/18/13 10:49 PM      Result Value Range Status   Specimen Description BLOOD RIGHT ANTECUBITAL   Final   Special Requests BOTTLES DRAWN AEROBIC AND ANAEROBIC 2CC   Final   Culture  Setup Time     Final   Value: 08/19/2013 08:53  Performed at [facility]     Final   Value:        BLOOD CULTURE RECEIVED NO GROWTH TO DATE CULTURE WILL BE HELD FOR 5 DAYS BEFORE ISSUING A FINAL NEGATIVE REPORT     Performed at [facility]   Report Status PENDING   Incomplete  URINE CULTURE     Status: None   Collection Time    08/18/13 10:51 PM      Result Value Range Status   Specimen Description URINE, CLEAN CATCH   Final   Special Requests NONE   Final   Culture  Setup Time     Final   Value: 08/19/2013 09:43     Performed at [place]     Final   Value: NO GROWTH     Performed at [facility]   Culture     Final   Value: NO GROWTH     Performed at [facility]   Report Status 08/20/2013 FINAL   Final  CULTURE, EXPECTORATED SPUTUM-ASSESSMENT     Status: None   Collection Time    08/19/13  8:18 AM      Result Value Range Status   Specimen Description SPUTUM   Final   Special Requests NONE   Final   Sputum evaluation     Final   Value: MICROSCOPIC FINDINGS SUGGEST THAT THIS SPECIMEN IS NOT REPRESENTATIVE OF LOWER RESPIRATORY SECRETIONS. PLEASE RECOLLECT.     CALLED TO OWEN RN AT [phone] BY TIBBITTS,KELLY   Report Status 08/19/2013 FINAL   Final     Labs: Basic Metabolic Panel:  Recent Labs Lab 08/18/13 2300  08/19/13 0430 08/20/13 0600 08/21/13 0515  NA 139 139 140 142  K 3.4* 3.6* 4.2 4.0  CL 100 104 104 103  CO2 '24 22 24 26  ' GLUCOSE 122* 149* 193* 82  BUN '14 13 10 9  ' CREATININE 1.24 1.05 1.01 0.97  CALCIUM 7.7* 6.8* 7.8* 8.4   Liver Function Tests:  Recent Labs Lab 08/18/13 2300 08/19/13 0430  AST 17 16  ALT 15 12  ALKPHOS 84 71  BILITOT 1.1 0.8  PROT 6.6 5.6*  ALBUMIN 3.9 3.2*   No results found for this basename: LIPASE, AMYLASE,  in the last 168 hours No results found for this basename: AMMONIA,  in the last 168 hours CBC:  Recent Labs Lab 08/18/13 2300 08/19/13 0430 08/20/13 0600 08/21/13 0515  WBC 20.4* 14.0* 7.6 1.9*  NEUTROABS 19.8* 13.6*  --  1.5*  HGB 10.5* 9.0* 9.1* 9.4*  HCT 31.0* 26.4* 26.6* 27.9*  MCV 91.7 91.7 91.1 91.2  PLT 126* 97* 78* 59*   Cardiac Enzymes: No results found for this basename: CKTOTAL, CKMB, CKMBINDEX, TROPONINI,  in the last 168 hours BNP: BNP (last 3 results) No results found for this basename: PROBNP,  in the last 8760 hours CBG: No results found for this basename: GLUCAP,  in the last 168 hours   Signed:  Roselia Snipe K  [facility] 08/21/2013, 8:29 AM

## 2013-08-21 NOTE — Progress Notes (Signed)
Discussed with patient that Heparin 1000 units/ml were instilled to his left chest temporary catheter on Monday. That catheter should not be flushed without drawing 3-5 ml of blood per port before ports being flushed with NS. Both ports clamped and capped with dead end caps, and ports taped together. Patient verbalized understanding and stated he would be seen on Friday morning at [facility]. Informed that he may call VAS Team 24/7, if questions or concerns arise before Friday morning.

## 2013-08-21 NOTE — Progress Notes (Signed)
Mr. Splitt feels better. He's afebrile. He's positive for influenza A. He is on Tamiflu.  His white cell count is going down. This is no surprise. He is on Neupogen. He needs to be on prophylactic Levaquin. I wrote for this.  He's had no cough. His appetite is good. Does note diarrhea. He's had no nausea vomiting. There's no mouth sores.  His vital signs are stable. His blood pressure is 135/71. His temperature is 99.6.  His oral exam is negative. There is no mucositis. Lungs are clear. There are no rales wheezes or rhonchi. Cardiac exam is regular rate rhythm. Abdomen is soft. Extremities shows no clubbing cyanosis or edema.  Her labs show a white cell count now 1.9. Hemoglobin 9.4. Platelet count 59,000. Potassium is 4.0.  He does need to go home. He is rapidly becoming neutropenic. I'll much rather have him out of the hospital.  He must be on Levaquin, Famvir, and Diflucan as prophylactic antibiotics. I suppose he will  be on Tamiflu for the influenza.  Again, he can go home. He goes to [facility] on Friday. It   [place] 55:22

## 2013-08-25 LAB — CULTURE, BLOOD (ROUTINE X 2)
CULTURE: NO GROWTH
Culture: NO GROWTH

## 2013-09-11 DIAGNOSIS — G629 Polyneuropathy, unspecified: Secondary | ICD-10-CM | POA: Insufficient documentation

## 2013-09-12 HISTORY — PX: BONE MARROW TRANSPLANT: SHX200

## 2013-09-20 DIAGNOSIS — R079 Chest pain, unspecified: Secondary | ICD-10-CM | POA: Insufficient documentation

## 2013-09-30 ENCOUNTER — Ambulatory Visit: Payer: Self-pay | Admitting: Hematology & Oncology

## 2013-09-30 ENCOUNTER — Other Ambulatory Visit: Payer: Self-pay | Admitting: Lab

## 2013-10-04 ENCOUNTER — Other Ambulatory Visit: Payer: Self-pay | Admitting: Lab

## 2013-10-04 ENCOUNTER — Ambulatory Visit: Payer: Self-pay | Admitting: Hematology & Oncology

## 2013-10-15 ENCOUNTER — Telehealth: Payer: Self-pay | Admitting: Hematology & Oncology

## 2013-10-15 NOTE — Telephone Encounter (Signed)
Faxed Medical Records via fax today to:  [place] Ph: [phone] Fx: [phone]  Medical  Records requested from 2012 to present   [place] SCANNED

## 2013-11-12 DIAGNOSIS — Z949 Transplanted organ and tissue status, unspecified: Secondary | ICD-10-CM | POA: Insufficient documentation

## 2013-11-15 ENCOUNTER — Telehealth: Payer: Self-pay | Admitting: Hematology & Oncology

## 2013-11-15 NOTE — Telephone Encounter (Signed)
Pt aware of 4-29 appointment

## 2013-11-27 ENCOUNTER — Encounter: Payer: Self-pay | Admitting: Hematology & Oncology

## 2013-11-27 ENCOUNTER — Ambulatory Visit (HOSPITAL_BASED_OUTPATIENT_CLINIC_OR_DEPARTMENT_OTHER): Payer: BC Managed Care – PPO | Admitting: Hematology & Oncology

## 2013-11-27 ENCOUNTER — Other Ambulatory Visit (HOSPITAL_BASED_OUTPATIENT_CLINIC_OR_DEPARTMENT_OTHER): Payer: BC Managed Care – PPO | Admitting: Lab

## 2013-11-27 VITALS — BP 140/82 | HR 79 | Temp 98.2°F | Resp 18 | Ht 70.0 in | Wt 209.0 lb

## 2013-11-27 DIAGNOSIS — C9000 Multiple myeloma not having achieved remission: Secondary | ICD-10-CM

## 2013-11-27 DIAGNOSIS — Z9484 Stem cells transplant status: Secondary | ICD-10-CM

## 2013-11-27 LAB — CBC WITH DIFFERENTIAL (CANCER CENTER ONLY)
BASO#: 0 10*3/uL (ref 0.0–0.2)
BASO%: 0.3 % (ref 0.0–2.0)
EOS%: 2.5 % (ref 0.0–7.0)
Eosinophils Absolute: 0.1 10*3/uL (ref 0.0–0.5)
HEMATOCRIT: 31 % — AB (ref 38.7–49.9)
HGB: 10.5 g/dL — ABNORMAL LOW (ref 13.0–17.1)
LYMPH#: 1.4 10*3/uL (ref 0.9–3.3)
LYMPH%: 37.3 % (ref 14.0–48.0)
MCH: 33 pg (ref 28.0–33.4)
MCHC: 33.9 g/dL (ref 32.0–35.9)
MCV: 98 fL (ref 82–98)
MONO#: 0.5 10*3/uL (ref 0.1–0.9)
MONO%: 12.6 % (ref 0.0–13.0)
NEUT#: 1.7 10*3/uL (ref 1.5–6.5)
NEUT%: 47.3 % (ref 40.0–80.0)
Platelets: 114 10*3/uL — ABNORMAL LOW (ref 145–400)
RBC: 3.18 10*6/uL — ABNORMAL LOW (ref 4.20–5.70)
RDW: 14.1 % (ref 11.1–15.7)
WBC: 3.7 10*3/uL — ABNORMAL LOW (ref 4.0–10.0)

## 2013-11-27 LAB — COMPREHENSIVE METABOLIC PANEL
ALT: 17 U/L (ref 0–53)
AST: 16 U/L (ref 0–37)
Albumin: 4.6 g/dL (ref 3.5–5.2)
Alkaline Phosphatase: 52 U/L (ref 39–117)
BILIRUBIN TOTAL: 0.8 mg/dL (ref 0.2–1.2)
BUN: 15 mg/dL (ref 6–23)
CO2: 28 meq/L (ref 19–32)
Calcium: 9.2 mg/dL (ref 8.4–10.5)
Chloride: 106 mEq/L (ref 96–112)
Creatinine, Ser: 1.16 mg/dL (ref 0.50–1.35)
Glucose, Bld: 89 mg/dL (ref 70–99)
Potassium: 4.1 mEq/L (ref 3.5–5.3)
Sodium: 141 mEq/L (ref 135–145)
Total Protein: 6.5 g/dL (ref 6.0–8.3)

## 2013-11-27 MED ORDER — LENALIDOMIDE 25 MG PO CAPS: ORAL_CAPSULE | ORAL | Status: DC

## 2013-11-27 NOTE — Patient Instructions (Signed)
Lenalidomide Oral Capsules What is this medicine? LENALIDOMIDE (len a LID oh mide) is used to treat certain types of cancer, including multiple myeloma and mantle cell lymphoma. It is also used to treat some myelodysplastic syndromes that cause severe anemia requiring blood transfusions. This medicine may be used for other purposes; ask your health care provider or pharmacist if you have questions. COMMON BRAND NAME(S): Revlimid What should I tell my health care provider before I take this medicine? They need to know if you have any of these conditions: -blood clots in the legs or the lungs -infection -irregular monthly periods or menstrual cycles -kidney disease -liver disease -an unusual or allergic reaction to lenalidomide, other medicines, foods, dyes, or preservatives -pregnant or trying to get pregnant -breast-feeding How should I use this medicine? Take this medicine by mouth with a glass of water. Follow the directions on the prescription label. Do not cut, crush, or chew this medicine. Take your medicine at regular intervals. Do not take it more often than directed. Do not stop taking except on your doctor's advice. A MedGuide will be given with each prescription and refill. Read this guide carefully each time. The MedGuide may change frequently. Talk to your pediatrician regarding the use of this medicine in children. Special care may be needed. Overdosage: If you think you have taken too much of this medicine contact a poison control center or emergency room at once. NOTE: This medicine is only for you. Do not share this medicine with others. What if I miss a dose? If you miss a dose, take it as soon as you can. If your next dose is to be taken in less than 12 hours, then do not take the missed dose. Take the next dose at your regular time. Do not take double or extra doses. What may interact with this medicine? -vaccines This list may not describe all possible interactions. Give  your health care provider a list of all the medicines, herbs, non-prescription drugs, or dietary supplements you use. Also tell them if you smoke, drink alcohol, or use illegal drugs. Some items may interact with your medicine. What should I watch for while using this medicine? Visit your doctor for regular check ups. Tell your doctor or healthcare professional if your symptoms do not start to get better or if they get worse. You will need to have important blood work done while you are taking this medicine. This medicine is available only through a special program. Doctors, pharmacies, and patients must meet all of the conditions of the program. Your health care provider will help you get signed up with the program if you need this medicine. Through the program you will only receive up to a 28 day supply of the medicine at one time. You will need a new prescription for each refill. This medicine can cause birth defects. Do not get pregnant while taking this drug. Females with child-bearing potential will need to have 2 negative pregnancy tests before starting this medicine. Pregnancy testing must be done every 2 to 4 weeks as directed while taking this medicine. Use 2 reliable forms of birth control together while you are taking this medicine and for 1 month after you stop taking this medicine. If you think that you might be pregnant talk to your doctor right away. Men must use a latex condom during sexual contact with a woman while taking this medicine and for 28 days after you stop taking this medicine. A latex condom is needed even  if you have had a vasectomy. Contact your doctor right away if your partner becomes pregnant. Do not donate sperm while taking this medicine and for 28 days after you stop taking this medicine. Do not give blood while taking the medicine and for 1 month after completion of treatment to avoid exposing pregnant women to the medicine through the donated blood. Talk to your doctor  about your risk of cancer. You may be more at risk for certain types of cancers if you take this medicine. You may need blood work done while you are taking this medicine. What side effects may I notice from receiving this medicine? Side effects that you should report to your doctor or health care professional as soon as possible: -allergic reactions like skin rash, itching or hives, swelling of the face, lips, or tongue -breathing problems -chest pain -fever, infection, runny nose, or sore throat -pain in the legs -right upper belly pain -signs and symptoms of bleeding such as bloody or black, tarry stools; red or dark-brown urine; spitting up blood or brown material that looks like coffee grounds; red spots on the skin; unusual bruising or bleeding from the eye, gums, or nose -swelling or your hands, ankles, or leg -tiredness -yellowing of the eyes or skin  Side effects that usually do not require medical attention (report to your doctor or health care professional if they continue or are bothersome): -diarrhea -dizziness -back pain This list may not describe all possible side effects. Call your doctor for medical advice about side effects. You may report side effects to FDA at 1-800-FDA-1088. Where should I keep my medicine? Keep out of the reach of children. Store at room temperature between 15 and 30 degrees C (59 and 86 degrees F). Throw away any unused medicine after the expiration date. NOTE: This sheet is a summary. It may not cover all possible information. If you have questions about this medicine, talk to your doctor, pharmacist, or health care provider.  2014, Elsevier/Gold Standard. (2012-12-04 16:37:01)

## 2013-11-27 NOTE — Progress Notes (Signed)
Pt completed application for REMS with [employer]. Received confirmation that pt was enrolled into the program. Rx and insurance info faxed to [facility] for a medication refill response. Information faxed to [phone]. Confirmation received and provider survey  Completed.

## 2013-11-28 MED ORDER — VITAMIN B-6 250 MG PO TABS: 250.0000 mg | ORAL_TABLET | Freq: Every day | ORAL | Status: DC

## 2013-11-28 NOTE — Progress Notes (Signed)
Hematology and Oncology Follow Up Visit  Mark [name] [id number] [birth year] 43 y.o. 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  Mark [name] is back for followup. We've not seen him for a while. We treated him with salvage chemotherapy with VD-PACE and he actually got into remission with this. He went to [facility] and had his stem cell transplant. He had a tough time with this. However, he got through this.  He actually looks quite good. He is not complaining of any pain. He's had some nausea and vomiting. He's had no bleeding. He's had no diarrhea. He's had no rashes. There's been occasional headache.  VS he wants to go back to work. I told him he can go back to work only part-time for the next couple weeks.  He was told by his doctors at [facility] that he is in remission and there is no evidence of myeloma.  He also was told about the possibility of maintenance therapy.  Medications: Current outpatient prescriptions:docusate sodium (COLACE) 100 MG capsule, Take 200 mg by mouth as needed. , Disp: , Rfl: ;  fluconazole (DIFLUCAN) 100 MG tablet, Take 1 tablet (100 mg total) by mouth daily., Disp: 10 tablet, Rfl: 0;  gabapentin (NEURONTIN) 100 MG capsule, Take 100 mg by mouth 2 (two) times daily. TAKES 3  100 MG TAB IN AM AND 3  100 MG TABS AT LUNCH, Disp: , Rfl:  gabapentin (NEURONTIN) 600 MG tablet, Take 600 mg by mouth at bedtime. , Disp: , Rfl: ;  lenalidomide (REVLIMID) 25 MG capsule, Take 1 pill a day for 21 days and then stop for 7 days., Disp: 21 capsule, Rfl: 1;  Pyridoxine HCl (VITAMIN B-6) 250 MG tablet, Take 1 tablet (250 mg total) by mouth daily., Disp: 30 tablet, Rfl: 6  Allergies: No Known Allergies  Past Medical History, Surgical history, Social history, and Family History were reviewed and updated.  Review of Systems: As above  Physical  Exam:  height is 5\' 10"  (1.778 m) and weight is 209 lb (94.802 kg). His oral temperature is 98.2 F (36.8 C). His blood pressure is 140/82 and his pulse is 79. His respiration is 18.   Well-developed and well-nourished African American gentleman. His head exam shows no ocular or oral lesions. There is no palpable cervical or supra-clavicular lymph nodes. Lungs are clear. Cardiac exam regular in rhythm with a normal S1 and S2. There are no murmurs rubs or bruits. Abdomen is soft. Has good bowel sounds. There is no fluid wave. There is a palpable hepatosplenomegaly. Back exam no tenderness over the spine ribs or hips. Extremities shows no clubbing cyanosis or edema. Has good range of motion of his joints. Has good muscle strength. Skin exam no rashes. Neurological exam shows no focal deficits.  Lab Results  Component Value Date   WBC 3.7* 11/27/2013   HGB 10.5* 11/27/2013   HCT 31.0* 11/27/2013   MCV 98 11/27/2013   PLT 114* 11/27/2013     Chemistry      Component Value Date/Time   NA 141 11/27/2013 1324   NA 140 07/26/2013 1325   NA 145 02/21/2013 0908   K 4.1 11/27/2013 1324   K 4.2 07/26/2013 1325   K 3.8 02/21/2013 0908   CL 106 11/27/2013 1324   CL 104 07/26/2013 1325   CO2 28 11/27/2013  1324   CO2 29 07/26/2013 1325   CO2 32* 02/21/2013 0908   BUN 15 11/27/2013 1324   BUN 14 07/26/2013 1325   BUN 19.5 02/21/2013 0908   CREATININE 1.16 11/27/2013 1324   CREATININE 1.2 07/26/2013 1325   CREATININE 1.5* 02/21/2013 0908      Component Value Date/Time   CALCIUM 9.2 11/27/2013 1324   CALCIUM 8.8 07/26/2013 1325   CALCIUM 17.7 Repeated and Verified* 02/21/2013 0908   CALCIUM >15.0* 02/01/2013 1230   ALKPHOS 52 11/27/2013 1324   ALKPHOS 84 07/26/2013 1325   AST 16 11/27/2013 1324   AST 23 07/26/2013 1325   ALT 17 11/27/2013 1324   ALT 18 07/26/2013 1325   BILITOT 0.8 11/27/2013 1324   BILITOT 0.70 07/26/2013 1325         Impression and Plan: Mr. [name] is a 44 year old African American  gentleman. He presented with extensive bony disease. He a plasmacytoma. He had kappa light chain myeloma. He had multiple chromosomal abnormalities.  He is status post stem cell transplant now.  He would be a good candidate for maintenance therapy. Revlimid  would certainly be reasonable. He's not had Revlimid to date.  I don't think he needs a bone marrow test right now. His blood counts look real good. However, down the road, I would think that a bone marrow test would be needed.  I would start him with Revlimid at 25 mg a day for 21 days on and 7 days off. I'll do this for 2 months. Did not put him on low-dose Revlimid 10 mg a day.  I do think that he will need Zometa. I don't see any contraindication to him to been on Zometa.  We will go ahead and get this all started next couple weeks.  I'll plan to see him back myself in about 4 weeks or so.  He does not have to go back to [facility] for another 3 or 4 months.  I spent a good 45 minutes with he and his wife. His wife he just had surgery for a thymoma. She's recovering from this.  [name], MD 4/30/20156:30 PM

## 2013-11-29 ENCOUNTER — Telehealth: Payer: Self-pay | Admitting: Hematology & Oncology

## 2013-11-29 NOTE — Telephone Encounter (Signed)
Left pt message for 5-21 appointment

## 2013-12-04 ENCOUNTER — Telehealth: Payer: Self-pay | Admitting: Hematology & Oncology

## 2013-12-04 NOTE — Telephone Encounter (Signed)
[employer] approved REVLIMID 25 mg capsule.   Auth [id number] Status: Approved Dates: 12/03/2013-12/04/2014   P: [phone]   COPY SCANNED

## 2013-12-05 ENCOUNTER — Encounter: Payer: Self-pay | Admitting: *Deleted

## 2013-12-05 NOTE — Progress Notes (Signed)
Received confirmation that [facility] shipped the patient's Revlimid on 12/04/13.

## 2013-12-19 ENCOUNTER — Ambulatory Visit (HOSPITAL_BASED_OUTPATIENT_CLINIC_OR_DEPARTMENT_OTHER): Payer: BC Managed Care – PPO | Admitting: Hematology & Oncology

## 2013-12-19 ENCOUNTER — Encounter: Payer: Self-pay | Admitting: Hematology & Oncology

## 2013-12-19 ENCOUNTER — Other Ambulatory Visit (HOSPITAL_BASED_OUTPATIENT_CLINIC_OR_DEPARTMENT_OTHER): Payer: BC Managed Care – PPO | Admitting: Lab

## 2013-12-19 ENCOUNTER — Ambulatory Visit (HOSPITAL_BASED_OUTPATIENT_CLINIC_OR_DEPARTMENT_OTHER): Payer: BC Managed Care – PPO

## 2013-12-19 VITALS — BP 127/77 | HR 77 | Temp 98.4°F | Resp 16 | Ht 70.0 in | Wt 213.0 lb

## 2013-12-19 DIAGNOSIS — C9000 Multiple myeloma not having achieved remission: Secondary | ICD-10-CM

## 2013-12-19 LAB — CMP (CANCER CENTER ONLY)
ALK PHOS: 55 U/L (ref 26–84)
ALT(SGPT): 31 U/L (ref 10–47)
AST: 29 U/L (ref 11–38)
Albumin: 4 g/dL (ref 3.3–5.5)
BILIRUBIN TOTAL: 1.6 mg/dL (ref 0.20–1.60)
BUN, Bld: 10 mg/dL (ref 7–22)
CO2: 29 mEq/L (ref 18–33)
Calcium: 8.5 mg/dL (ref 8.0–10.3)
Chloride: 101 mEq/L (ref 98–108)
Creat: 0.9 mg/dl (ref 0.6–1.2)
Glucose, Bld: 77 mg/dL (ref 73–118)
Potassium: 3.3 mEq/L (ref 3.3–4.7)
SODIUM: 141 meq/L (ref 128–145)
TOTAL PROTEIN: 6.9 g/dL (ref 6.4–8.1)

## 2013-12-19 LAB — CBC WITH DIFFERENTIAL (CANCER CENTER ONLY)
BASO#: 0 10*3/uL (ref 0.0–0.2)
BASO%: 0 % (ref 0.0–2.0)
EOS ABS: 0.1 10*3/uL (ref 0.0–0.5)
EOS%: 3.2 % (ref 0.0–7.0)
HEMATOCRIT: 32.2 % — AB (ref 38.7–49.9)
HGB: 11.1 g/dL — ABNORMAL LOW (ref 13.0–17.1)
LYMPH#: 0.7 10*3/uL — ABNORMAL LOW (ref 0.9–3.3)
LYMPH%: 26.7 % (ref 14.0–48.0)
MCH: 33 pg (ref 28.0–33.4)
MCHC: 34.5 g/dL (ref 32.0–35.9)
MCV: 96 fL (ref 82–98)
MONO#: 0.5 10*3/uL (ref 0.1–0.9)
MONO%: 17 % — ABNORMAL HIGH (ref 0.0–13.0)
NEUT#: 1.5 10*3/uL (ref 1.5–6.5)
NEUT%: 53.1 % (ref 40.0–80.0)
Platelets: 128 10*3/uL — ABNORMAL LOW (ref 145–400)
RBC: 3.36 10*6/uL — ABNORMAL LOW (ref 4.20–5.70)
RDW: 13.1 % (ref 11.1–15.7)
WBC: 2.8 10*3/uL — AB (ref 4.0–10.0)

## 2013-12-19 MED ORDER — HEPARIN SOD (PORK) LOCK FLUSH 100 UNIT/ML IV SOLN
250.0000 [IU] | Freq: Once | INTRAVENOUS | Status: DC | PRN
  Filled 2013-12-19: qty 5

## 2013-12-19 MED ORDER — SODIUM CHLORIDE 0.9 % IJ SOLN
3.0000 mL | Freq: Once | INTRAMUSCULAR | Status: DC | PRN
  Filled 2013-12-19: qty 10

## 2013-12-19 MED ORDER — PEGFILGRASTIM INJECTION 6 MG/0.6ML: 6.0000 mg | Freq: Once | SUBCUTANEOUS | Status: DC

## 2013-12-19 MED ORDER — ALTEPLASE 2 MG IJ SOLR
2.0000 mg | Freq: Once | INTRAMUSCULAR | Status: DC | PRN
  Filled 2013-12-19: qty 2

## 2013-12-19 MED ORDER — SODIUM CHLORIDE 0.9 % IV SOLN
Freq: Once | INTRAVENOUS | Status: AC
  Administered 2013-12-19: 15:00:00 via INTRAVENOUS

## 2013-12-19 MED ORDER — ZOLEDRONIC ACID 4 MG/100ML IV SOLN
4.0000 mg | Freq: Once | INTRAVENOUS | Status: AC
  Administered 2013-12-19: 4 mg via INTRAVENOUS
  Filled 2013-12-19: qty 100

## 2013-12-19 MED ORDER — HEPARIN SOD (PORK) LOCK FLUSH 100 UNIT/ML IV SOLN
500.0000 [IU] | Freq: Once | INTRAVENOUS | Status: DC | PRN
  Filled 2013-12-19: qty 5

## 2013-12-19 MED ORDER — SODIUM CHLORIDE 0.9 % IJ SOLN
10.0000 mL | INTRAMUSCULAR | Status: DC | PRN
  Filled 2013-12-19: qty 10

## 2013-12-19 MED ORDER — ACYCLOVIR 400 MG PO TABS: 400.0000 mg | ORAL_TABLET | Freq: Two times a day (BID) | ORAL | Status: DC

## 2013-12-19 NOTE — Patient Instructions (Signed)

## 2013-12-20 LAB — IGG, IGA, IGM
IgA: 38 mg/dL — ABNORMAL LOW (ref 68–379)
IgG (Immunoglobin G), Serum: 831 mg/dL (ref 650–1600)
IgM, Serum: 37 mg/dL — ABNORMAL LOW (ref 41–251)

## 2013-12-20 LAB — KAPPA/LAMBDA LIGHT CHAINS
KAPPA FREE LGHT CHN: 2.44 mg/dL — AB (ref 0.33–1.94)
Kappa:Lambda Ratio: 1.23 (ref 0.26–1.65)
LAMBDA FREE LGHT CHN: 1.99 mg/dL (ref 0.57–2.63)

## 2013-12-20 NOTE — Progress Notes (Signed)
Hematology and Oncology Follow Up Visit  Maximilliano Kersh [id number] 08-Aug-1970 43 y.o. 12/20/2013   Principle Diagnosis:   Kappa light chain myeloma  Current Therapy:   Revlimid 25 mg by mouth daily (21/7) Zometa 4 mg IV q. month Velcade q 3 week dosing Interim History:  Mr.  Melander is back for followup. He is doing fairly well. He's done well with the Revlimid. He's on the "induction" portion of the maintenance phase. He will finish up his first month this week. He then will have a second month of 25 mg. We will then get him on 10 mg.  He wants to back to work. I think it's okay if he goes back to work.  Is in no problems with bleeding. He's had some slight back discomfort. He's had no weakness. He's had no cough. He's had no headache. He's had no nausea vomiting.     Medications: Current outpatient prescriptions:acyclovir (ZOVIRAX) 400 MG tablet, Take 1 tablet (400 mg total) by mouth 2 (two) times daily. NOT SURE OF DOSE, Disp: 60 tablet, Rfl: 12;  docusate sodium (COLACE) 100 MG capsule, Take 200 mg by mouth as needed. , Disp: , Rfl: ;  gabapentin (NEURONTIN) 300 MG capsule, Take 300 mg by mouth 2 (two) times daily. USING 3  100 MG TAB. IN AM AND PM, Disp: , Rfl:  gabapentin (NEURONTIN) 600 MG tablet, Take 600 mg by mouth at bedtime., Disp: , Rfl: ;  lenalidomide (REVLIMID) 25 MG capsule, Take 1 pill a day for 21 days and then stop for 7 days., Disp: 21 capsule, Rfl: 1  Allergies: No Known Allergies  Past Medical History, Surgical history, Social history, and Family History were reviewed and updated.  Review of Systems: As above  Physical Exam:  height is 5\' 10"  (1.778 m) and weight is 213 lb (96.616 kg). His oral temperature is 98.4 F (36.9 C). His blood pressure is 127/77 and his pulse is 77. His respiration is 16.   Well-developed and well-nourished African American gentleman. Head and neck exam shows no ocular or oral lesion. There are no palpable cervical or supraclavicular  lymph nodes. Lungs are clear. Cardiac exam regular rate and rhythm with no murmurs rubs or bruits. Abdomen soft. Has good bowel sounds. There is no fluid wave. There is no palpable liver or spleen tip. Extremities shows no clubbing cyanosis or edema. Back exam no tenderness over the spine ribs or hips. Skin exam no rashes. Neurological exam nonfocal.  Lab Results  Component Value Date   WBC 2.8* 12/19/2013   HGB 11.1* 12/19/2013   HCT 32.2* 12/19/2013   MCV 96 12/19/2013   PLT 128* 12/19/2013     Chemistry      Component Value Date/Time   NA 141 12/19/2013 1318   NA 141 11/27/2013 1324   NA 145 02/21/2013 0908   K 3.3 12/19/2013 1318   K 4.1 11/27/2013 1324   K 3.8 02/21/2013 0908   CL 101 12/19/2013 1318   CL 106 11/27/2013 1324   CO2 29 12/19/2013 1318   CO2 28 11/27/2013 1324   CO2 32* 02/21/2013 0908   BUN 10 12/19/2013 1318   BUN 15 11/27/2013 1324   BUN 19.5 02/21/2013 0908   CREATININE 0.9 12/19/2013 1318   CREATININE 1.16 11/27/2013 1324   CREATININE 1.5* 02/21/2013 0908      Component Value Date/Time   CALCIUM 8.5 12/19/2013 1318   CALCIUM 9.2 11/27/2013 1324   CALCIUM 17.7 Repeated and Verified* 02/21/2013 9937  CALCIUM >15.0* 02/01/2013 1230   ALKPHOS 55 12/19/2013 1318   ALKPHOS 52 11/27/2013 1324   AST 29 12/19/2013 1318   AST 16 11/27/2013 1324   ALT 31 12/19/2013 1318   ALT 17 11/27/2013 1324   BILITOT 1.60 12/19/2013 1318   BILITOT 0.8 11/27/2013 1324         Impression and Plan: Mr. Ries is 43 year old African American gentleman. He has Kappa light chain myeloma. He did undergo stem cell transplant.  He is on maintenance therapy now. We will add Velcade. I think this is reasonable as he has had multiple chromosomal abnormalities.  We will came him Zometa.  I think that he still has a risk of recurrence.  We will hold off on any more about his right now. I don't think we need any scans.  We will give him back in another preoperative 4 weeks.   [name],  MD 5/22/20156:57 AM

## 2013-12-26 ENCOUNTER — Other Ambulatory Visit: Payer: Self-pay | Admitting: *Deleted

## 2013-12-26 DIAGNOSIS — C9000 Multiple myeloma not having achieved remission: Secondary | ICD-10-CM

## 2013-12-26 MED ORDER — LENALIDOMIDE 25 MG PO CAPS: ORAL_CAPSULE | ORAL | Status: DC

## 2013-12-29 ENCOUNTER — Emergency Department (HOSPITAL_COMMUNITY): Payer: BC Managed Care – PPO

## 2013-12-29 ENCOUNTER — Encounter (HOSPITAL_COMMUNITY): Payer: Self-pay | Admitting: Emergency Medicine

## 2013-12-29 ENCOUNTER — Emergency Department (HOSPITAL_COMMUNITY)
Admission: EM | Admit: 2013-12-29 | Discharge: 2013-12-29 | Disposition: A | Discharge status: Home / Self Care | Payer: BC Managed Care – PPO | Attending: Emergency Medicine | Admitting: Emergency Medicine

## 2013-12-29 DIAGNOSIS — D709 Neutropenia, unspecified: Secondary | ICD-10-CM | POA: Insufficient documentation

## 2013-12-29 DIAGNOSIS — R209 Unspecified disturbances of skin sensation: Secondary | ICD-10-CM | POA: Insufficient documentation

## 2013-12-29 DIAGNOSIS — R05 Cough: Secondary | ICD-10-CM | POA: Insufficient documentation

## 2013-12-29 DIAGNOSIS — R079 Chest pain, unspecified: Secondary | ICD-10-CM | POA: Insufficient documentation

## 2013-12-29 DIAGNOSIS — R110 Nausea: Secondary | ICD-10-CM | POA: Insufficient documentation

## 2013-12-29 DIAGNOSIS — T887XXA Unspecified adverse effect of drug or medicament, initial encounter: Secondary | ICD-10-CM

## 2013-12-29 DIAGNOSIS — T451X5A Adverse effect of antineoplastic and immunosuppressive drugs, initial encounter: Secondary | ICD-10-CM | POA: Insufficient documentation

## 2013-12-29 DIAGNOSIS — K5900 Constipation, unspecified: Secondary | ICD-10-CM | POA: Insufficient documentation

## 2013-12-29 DIAGNOSIS — C9000 Multiple myeloma not having achieved remission: Secondary | ICD-10-CM | POA: Insufficient documentation

## 2013-12-29 DIAGNOSIS — Z87891 Personal history of nicotine dependence: Secondary | ICD-10-CM | POA: Insufficient documentation

## 2013-12-29 DIAGNOSIS — R059 Cough, unspecified: Secondary | ICD-10-CM | POA: Insufficient documentation

## 2013-12-29 DIAGNOSIS — M549 Dorsalgia, unspecified: Secondary | ICD-10-CM | POA: Insufficient documentation

## 2013-12-29 DIAGNOSIS — R51 Headache: Secondary | ICD-10-CM | POA: Insufficient documentation

## 2013-12-29 DIAGNOSIS — R5383 Other fatigue: Principal | ICD-10-CM

## 2013-12-29 DIAGNOSIS — Z79899 Other long term (current) drug therapy: Secondary | ICD-10-CM | POA: Insufficient documentation

## 2013-12-29 DIAGNOSIS — Z859 Personal history of malignant neoplasm, unspecified: Secondary | ICD-10-CM | POA: Insufficient documentation

## 2013-12-29 DIAGNOSIS — R5381 Other malaise: Secondary | ICD-10-CM | POA: Insufficient documentation

## 2013-12-29 LAB — URINALYSIS, ROUTINE W REFLEX MICROSCOPIC
Bilirubin Urine: NEGATIVE
GLUCOSE, UA: NEGATIVE mg/dL
Hgb urine dipstick: NEGATIVE
Ketones, ur: NEGATIVE mg/dL
LEUKOCYTES UA: NEGATIVE
NITRITE: NEGATIVE
PH: 6 (ref 5.0–8.0)
Protein, ur: NEGATIVE mg/dL
Specific Gravity, Urine: 1.014 (ref 1.005–1.030)
Urobilinogen, UA: 1 mg/dL (ref 0.0–1.0)

## 2013-12-29 LAB — COMPREHENSIVE METABOLIC PANEL
ALBUMIN: 3.8 g/dL (ref 3.5–5.2)
ALT: 23 U/L (ref 0–53)
AST: 18 U/L (ref 0–37)
Alkaline Phosphatase: 60 U/L (ref 39–117)
BUN: 13 mg/dL (ref 6–23)
CO2: 25 mEq/L (ref 19–32)
CREATININE: 1.09 mg/dL (ref 0.50–1.35)
Calcium: 8.1 mg/dL — ABNORMAL LOW (ref 8.4–10.5)
Chloride: 106 mEq/L (ref 96–112)
GFR calc non Af Amer: 83 mL/min — ABNORMAL LOW (ref >90)
GLUCOSE: 108 mg/dL — AB (ref 70–99)
Potassium: 3.8 mEq/L (ref 3.7–5.3)
Sodium: 143 mEq/L (ref 137–147)
TOTAL PROTEIN: 6.6 g/dL (ref 6.0–8.3)
Total Bilirubin: 1.3 mg/dL — ABNORMAL HIGH (ref 0.3–1.2)

## 2013-12-29 LAB — CBC WITH DIFFERENTIAL/PLATELET
BASOS ABS: 0 10*3/uL (ref 0.0–0.1)
Basophils Relative: 1 % (ref 0–1)
EOS ABS: 0.1 10*3/uL (ref 0.0–0.7)
Eosinophils Relative: 6 % — ABNORMAL HIGH (ref 0–5)
HCT: 31.3 % — ABNORMAL LOW (ref 39.0–52.0)
Hemoglobin: 10.5 g/dL — ABNORMAL LOW (ref 13.0–17.0)
LYMPHS ABS: 0.9 10*3/uL (ref 0.7–4.0)
LYMPHS PCT: 39 % (ref 12–46)
MCH: 31.5 pg (ref 26.0–34.0)
MCHC: 33.5 g/dL (ref 30.0–36.0)
MCV: 94 fL (ref 78.0–100.0)
MONO ABS: 0.4 10*3/uL (ref 0.1–1.0)
Monocytes Relative: 18 % — ABNORMAL HIGH (ref 3–12)
NEUTROS ABS: 0.8 10*3/uL — AB (ref 1.7–7.7)
Neutrophils Relative %: 36 % — ABNORMAL LOW (ref 43–77)
Platelets: 166 10*3/uL (ref 150–400)
RBC: 3.33 MIL/uL — ABNORMAL LOW (ref 4.22–5.81)
RDW: 13.4 % (ref 11.5–15.5)
WBC: 2.2 10*3/uL — ABNORMAL LOW (ref 4.0–10.5)

## 2013-12-29 LAB — TROPONIN I

## 2013-12-29 MED ORDER — ONDANSETRON HCL 4 MG/2ML IJ SOLN
4.0000 mg | Freq: Once | INTRAMUSCULAR | Status: AC
  Administered 2013-12-29: 4 mg via INTRAVENOUS
  Filled 2013-12-29: qty 2

## 2013-12-29 MED ORDER — SODIUM CHLORIDE 0.9 % IV BOLUS (SEPSIS)
1000.0000 mL | Freq: Once | INTRAVENOUS | Status: AC
  Administered 2013-12-29: 1000 mL via INTRAVENOUS

## 2013-12-29 MED ORDER — HEPARIN SOD (PORK) LOCK FLUSH 100 UNIT/ML IV SOLN
500.0000 [IU] | Freq: Once | INTRAVENOUS | Status: AC
  Administered 2013-12-29: 500 [IU]
  Filled 2013-12-29: qty 5

## 2013-12-29 MED ORDER — HYDROMORPHONE HCL PF 1 MG/ML IJ SOLN
1.0000 mg | Freq: Once | INTRAMUSCULAR | Status: AC
  Administered 2013-12-29: 1 mg via INTRAVENOUS
  Filled 2013-12-29: qty 1

## 2013-12-29 MED ORDER — TRAMADOL HCL 50 MG PO TABS: 50.0000 mg | ORAL_TABLET | Freq: Four times a day (QID) | ORAL | Status: DC | PRN

## 2013-12-29 NOTE — ED Provider Notes (Signed)
Patient in XR on my attempt for initial eval. Patient's initial eval delayed.  ECG, SR 74, repol pattern, Abnormal     Mark Muskrat, MD 12/29/13 1506

## 2013-12-29 NOTE — ED Notes (Signed)
Pt's O2 Sat  90% on RA after receiving dilaudid and zofran; pt placed on 2 L O2 and O2 sat increased to 100%.

## 2013-12-29 NOTE — ED Provider Notes (Addendum)
CSN: [id number]     Arrival date & time 12/29/13  1310 History   First MD Initiated Contact with Patient 12/29/13 1343     Chief Complaint  Patient presents with  . Numbness  . Chest Pain  . Headache  . Weakness     (Consider location/radiation/quality/duration/timing/severity/associated sxs/prior Treatment) Patient is a 43 y.o. male presenting with chest pain, headaches, and weakness. The history is provided by the patient.  Chest Pain Associated symptoms: back pain, cough, headache, nausea and numbness   Associated symptoms: no abdominal pain, no diaphoresis, no shortness of breath and not vomiting   Headache Associated symptoms: back pain, cough, myalgias, nausea and numbness   Associated symptoms: no abdominal pain, no diarrhea, no neck pain, no neck stiffness, no sore throat and no vomiting   Weakness Associated symptoms include chest pain and headaches. Pertinent negatives include no abdominal pain and no shortness of breath.  pt with hx multiple myeloma, hx stem cell transplant, presents w variety of symptoms.  States started revlimid approximately 3 weeks ago.  States since then has experienced, general malaise and fatigue, dry mouth, constipation, bone and back pain, nausea/decreased appetite, low grade fever of 100, generalized weakness, 'neuropathy'/paresthesias bil feet and face, dry skin and dry mouth, and intermittent headaches. No acute or abrupt change in symptoms today, just says generally weak. No chills/sweats. Non prod cough, no sore throat, runny nose, or other uri c/o. No abd pain. No vomiting or diarrhea. Has bm q other day. No dysuria or gu c/o, urinating or normal frequency. Headaches dull, diffuse, intermittent, gradual in onset. No neck pain or stiffness. Denies any other recent change in meds.     Past Medical History  Diagnosis Date  . History of radiation therapy 02/07/13- 02/26/13    lower L spine, upper sacrum, 35 gray in 14 fractions  . Cancer   .  Multiple myeloma dx'd 01/2013   Past Surgical History  Procedure Laterality Date  . Portacath placement    . Bone marrow transplant     Family History  Problem Relation Age of Onset  . Diabetic kidney disease Mother   . Hypertension Mother   . Heart attack Mother   . Kidney failure Mother   . Coronary artery disease Mother   . HIV Father    History  Substance Use Topics  . Smoking status: Former Smoker -- 1.00 packs/day for 15 years    Types: Cigarettes    Start date: 06/29/1988    Quit date: 06/29/2003  . Smokeless tobacco: Never Used     Comment: quit smoking 10 years ago  . Alcohol Use: No    Review of Systems  Constitutional: Negative for chills and diaphoresis.  HENT: Negative for sore throat.   Eyes: Negative for redness and visual disturbance.  Respiratory: Positive for cough. Negative for shortness of breath.   Cardiovascular: Positive for chest pain. Negative for leg swelling.  Gastrointestinal: Positive for nausea. Negative for vomiting, abdominal pain and diarrhea.  Genitourinary: Negative for dysuria, flank pain and decreased urine volume.  Musculoskeletal: Positive for back pain and myalgias. Negative for neck pain and neck stiffness.  Skin: Negative for rash.  Neurological: Positive for numbness and headaches.  Hematological: Does not bruise/bleed easily.  Psychiatric/Behavioral: Negative for confusion.      Allergies  Review of patient's allergies indicates no known allergies.  Home Medications   Prior to Admission medications   Medication Sig Start Date End Date Taking? Authorizing Provider  acyclovir (ZOVIRAX)  800 MG tablet Take 800 mg by mouth 2 (two) times daily.   Yes Historical Provider, MD  bisacodyl (DULCOLAX) 5 MG EC tablet Take 5 mg by mouth daily as needed for moderate constipation.   Yes Historical Provider, MD  gabapentin (NEURONTIN) 300 MG capsule Take 300 mg by mouth 2 (two) times daily.    Yes Historical Provider, MD  gabapentin  (NEURONTIN) 600 MG tablet Take 600 mg by mouth at bedtime.   Yes Historical Provider, MD  lenalidomide (REVLIMID) 25 MG capsule Take 1 pill a day for 21 days and then stop for 7 days. Auth # [id number] 12/26/13  Yes [name], MD   BP 129/78  Pulse 74  Temp(Src) 100 F (37.8 C) (Oral)  Resp 16  SpO2 100% Physical Exam  Nursing note and vitals reviewed. Constitutional: He is oriented to person, place, and time. He appears well-developed and well-nourished. No distress.  HENT:  Head: Atraumatic.  Nose: Nose normal.  Mouth/Throat: Oropharynx is clear and moist.  No sinus or temporal tenderness.   Eyes: Conjunctivae are normal. Pupils are equal, round, and reactive to light. No scleral icterus.  Neck: Normal range of motion. Neck supple. No tracheal deviation present.  No stiffness or rigidity  Cardiovascular: Normal rate, regular rhythm, normal heart sounds and intact distal pulses.  Exam reveals no gallop and no friction rub.   No murmur heard. Pulmonary/Chest: Effort normal and breath sounds normal. No accessory muscle usage. No respiratory distress.  Port site right chest without sign of infection  Abdominal: Soft. Bowel sounds are normal. He exhibits no distension and no mass. There is no tenderness. There is no rebound and no guarding.  Genitourinary:  No cva tenderness  Musculoskeletal: Normal range of motion. He exhibits no edema and no tenderness.  Lymphadenopathy:    He has no cervical adenopathy.  Neurological: He is alert and oriented to person, place, and time.  Motor intact bil, steady gait.   Skin: Skin is warm and dry. No rash noted. He is not diaphoretic.  Psychiatric: He has a normal mood and affect.    ED Course  Procedures (including critical care time) Labs Review   Results for orders placed during the hospital encounter of 12/29/13  CBC WITH DIFFERENTIAL      Result Value Ref Range   WBC 2.2 (*) 4.0 - 10.5 K/uL   RBC 3.33 (*) 4.22 - 5.81 MIL/uL    Hemoglobin 10.5 (*) 13.0 - 17.0 g/dL   HCT 31.3 (*) 39.0 - 52.0 %   MCV 94.0  78.0 - 100.0 fL   MCH 31.5  26.0 - 34.0 pg   MCHC 33.5  30.0 - 36.0 g/dL   RDW 13.4  11.5 - 15.5 %   Platelets 166  150 - 400 K/uL   Neutrophils Relative % 36 (*) 43 - 77 %   Lymphocytes Relative 39  12 - 46 %   Monocytes Relative 18 (*) 3 - 12 %   Eosinophils Relative 6 (*) 0 - 5 %   Basophils Relative 1  0 - 1 %   Neutro Abs 0.8 (*) 1.7 - 7.7 K/uL   Lymphs Abs 0.9  0.7 - 4.0 K/uL   Monocytes Absolute 0.4  0.1 - 1.0 K/uL   Eosinophils Absolute 0.1  0.0 - 0.7 K/uL   Basophils Absolute 0.0  0.0 - 0.1 K/uL   WBC Morphology ATYPICAL LYMPHOCYTES    COMPREHENSIVE METABOLIC PANEL      Result Value  Ref Range   Sodium 143  137 - 147 mEq/L   Potassium 3.8  3.7 - 5.3 mEq/L   Chloride 106  96 - 112 mEq/L   CO2 25  19 - 32 mEq/L   Glucose, Bld 108 (*) 70 - 99 mg/dL   BUN 13  6 - 23 mg/dL   Creatinine, Ser 1.09  0.50 - 1.35 mg/dL   Calcium 8.1 (*) 8.4 - 10.5 mg/dL   Total Protein 6.6  6.0 - 8.3 g/dL   Albumin 3.8  3.5 - 5.2 g/dL   AST 18  0 - 37 U/L   ALT 23  0 - 53 U/L   Alkaline Phosphatase 60  39 - 117 U/L   Total Bilirubin 1.3 (*) 0.3 - 1.2 mg/dL   GFR calc non Af Amer 83 (*) >90 mL/min   GFR calc Af Amer >90  >90 mL/min  URINALYSIS, ROUTINE W REFLEX MICROSCOPIC      Result Value Ref Range   Color, Urine YELLOW  YELLOW   APPearance CLEAR  CLEAR   Specific Gravity, Urine 1.014  1.005 - 1.030   pH 6.0  5.0 - 8.0   Glucose, UA NEGATIVE  NEGATIVE mg/dL   Hgb urine dipstick NEGATIVE  NEGATIVE   Bilirubin Urine NEGATIVE  NEGATIVE   Ketones, ur NEGATIVE  NEGATIVE mg/dL   Protein, ur NEGATIVE  NEGATIVE mg/dL   Urobilinogen, UA 1.0  0.0 - 1.0 mg/dL   Nitrite NEGATIVE  NEGATIVE   Leukocytes, UA NEGATIVE  NEGATIVE  TROPONIN I      Result Value Ref Range   Troponin I <0.30  <0.30 ng/mL   Dg Chest 2 View  12/29/2013   CLINICAL DATA:  Chest discomfort, woke up with numbness to right side of body today,  headaches, fevers, nausea, history of multiple myeloma, bone marrow transplant December 2015  EXAM: CHEST  2 VIEW  COMPARISON:  08/18/2013  FINDINGS: The heart size and vascular pattern are normal. The lungs are clear. No pleural effusions. Port-A-Cath on the right is in unchanged position. Expansile lytic lesion lateral right seventh rib likely related to myeloma. This appears to been present previously.  IMPRESSION: No active cardiopulmonary disease.   Electronically Signed   By: Skipper Cliche M.D.   On: 12/29/2013 14:28         EKG Interpretation   Date/Time:  Sunday Dec 29 2013 13:15:38 EDT Ventricular Rate:  74 PR Interval:  162 QRS Duration: 92 QT Interval:  409 QTC Calculation: 454 R Axis:   61 Text Interpretation:  Sinus rhythm ST elev, probable normal early repol  pattern Baseline wander in lead(s) II III aVF Sinus rhythm Early  repolarization Abnormal ekg Confirmed by Carmin Muskrat  MD ([id number]) on  12/29/2013 3:06:36 PM      MDM  Iv ns bolus. Dilaudid 1 mg iv. zofran iv. Labs.  Reviewed nursing notes and prior charts for additional history.   Will discuss w pts oncologist.  Discussed pt with Dr Jana Hakim, including labs, neutrophil count, t 100, hx, symptoms, etc.  He indicates would advise stopping revlimid, give ivf, add bl cx to workup.   No antibiotic rx for now, to have f/u in heme/oncl clinic this week.   Additional ns iv.  Po fluids.  Pt feels much improved.   Vitals normal.   No cp or sob. Nausea resolved. Afeb.   Pt appears stable for d/c.      Mirna Mires, MD 12/29/13 [phone]

## 2013-12-29 NOTE — Discharge Instructions (Signed)
Rest. Drink plenty of fluids. Take zofran as need for nausea. You may take motrin or aleve as need for pain. You may take ultram as need for pain - no driving when taking.   We discussed your case with the on call heme/onc doctor for Dr Marin Olp - he indicates would advise holding/not re-starting the revlimid, but to further discuss this with your doctor/Dr Ennever in the next couple days. Follow up in the heme/onc clinic in the next couple days for recheck - call office tomorrow morning to arrange that appointment.   From today's lab tests, your neutrophil count is low (800), and calcium level slightly low (8.1) - discuss these results with your doctor and have them recheck.  Return to ER right away if worse, new symptoms, fevers, chills/sweats, weak/fainting, persistent vomiting, chest pain, trouble breathing, other concern.  You were given pain medication in the ER - no driving for the next 4 hours.   Multiple Myeloma Multiple myeloma is the most common cancer of bone. It is caused by the uncontrolled multiplication of a type of white blood cell in the marrow. This white blood cell is called a plasma cell. This means the bone marrow is overworking producing plasma cells. Soon these overproduced cells begin to take up room in the marrow that is needed by other cells. This means that there are soon not enough red or white blood cells or platelets. Not enough red cells mean that the person is anemic. There are not enough red blood cells to carry oxygen around the body. There are not enough white blood cells to fight disease. This causes the person with multiple myeloma to not feel well. There is also bone pain through much of the body. SYMPTOMS  Anemia causes fatigue (tiredness) and weakness.  Back pain is common. This is from fractures (break in bones) caused by damage to the bones of the back.  Lack of white blood cells makes infection more likely.  Bleeding is a common problem from lack of  the cells (platelets). Platelets help blood clots form. This may show up as bleeding from any place. Commonly this shows up as bleeding from the nose or gums.  Fractures (bone breaks) are more common anywhere. The back and ribs are the most commonly fractured areas. DIAGNOSIS  This tumor is often suggested by blood tests. Often doing a bone marrow sample makes the diagnosis (learning what is wrong). This is a test performed by taking a small sample of bone with a small needle. This bone often comes from the sternum (breast bone). This sample is sent to a pathologist (a specialist in looking at tissue under a microscope). After looking at the sample under the microscope, the pathologist is able to make a diagnosis of the problem. X-rays may also show boney changes. TREATMENT   Occasionally, anti-cancer medications may be used with multiple myeloma. Your caregiver can discuss this with you.  Medications can also be given to help with the bone pain.  There is no cure for multiple myeloma. Lifestyle changes can add years of quality living. HOME CARE INSTRUCTIONS  Often there is no specific treatment for multiple myeloma. Most of the treatment consists of adjustments in dietary and living activities. Some of these changes include:  Your dietitian or caregiver helping you with your dietary questions.  Taking iron and vitamins as prescribed by your caregiver.  Eating a well balanced diet.  Staying active, but follow restrictions suggested by your caregiver. Avoiding heavy lifting (more than  10 pounds) and activities that cause increased pain.  Drinking plenty of water.  Using back braces and a cane may help with some of the boney pain. SEEK IMMEDIATE MEDICAL CARE IF:  You develop severe, uncontrolled boney pain.  You or your family notices confusion, problems with decision-making or inability to stay awake.  You notice increased urination or constipation.  You notice problems holding your  water or stool.  You have numbness or loss of control of your extremities (arms/hands or legs/feet). Document Released: 04/12/2001 Document Revised: 10/10/2011 Document Reviewed: 07/13/2008 [facility] Patient Information 2014 [place].

## 2013-12-29 NOTE — ED Notes (Signed)
Pt has multiple complaints of chest pain, low back pain, numbness to face and  L side of body intermittently, fatigue and just generalized weakness. Pt is a multiple myeloma pt and started Revlimid the beginning of this month. Believes his symptoms are related to this med.

## 2013-12-29 NOTE — ED Notes (Addendum)
Pt unable to use the bathroom at this time

## 2014-01-02 ENCOUNTER — Emergency Department (HOSPITAL_COMMUNITY): Payer: BC Managed Care – PPO

## 2014-01-02 ENCOUNTER — Encounter (HOSPITAL_COMMUNITY): Payer: Self-pay | Admitting: Emergency Medicine

## 2014-01-02 ENCOUNTER — Emergency Department (HOSPITAL_COMMUNITY)
Admission: EM | Admit: 2014-01-02 | Discharge: 2014-01-02 | Disposition: A | Discharge status: Home / Self Care | Payer: BC Managed Care – PPO | Attending: Emergency Medicine | Admitting: Emergency Medicine

## 2014-01-02 DIAGNOSIS — Z87898 Personal history of other specified conditions: Secondary | ICD-10-CM | POA: Insufficient documentation

## 2014-01-02 DIAGNOSIS — Z9481 Bone marrow transplant status: Secondary | ICD-10-CM | POA: Insufficient documentation

## 2014-01-02 DIAGNOSIS — M79671 Pain in right foot: Secondary | ICD-10-CM

## 2014-01-02 DIAGNOSIS — M79609 Pain in unspecified limb: Secondary | ICD-10-CM | POA: Insufficient documentation

## 2014-01-02 DIAGNOSIS — R609 Edema, unspecified: Secondary | ICD-10-CM | POA: Insufficient documentation

## 2014-01-02 DIAGNOSIS — Z923 Personal history of irradiation: Secondary | ICD-10-CM | POA: Insufficient documentation

## 2014-01-02 DIAGNOSIS — Z79899 Other long term (current) drug therapy: Secondary | ICD-10-CM | POA: Insufficient documentation

## 2014-01-02 DIAGNOSIS — Z87891 Personal history of nicotine dependence: Secondary | ICD-10-CM | POA: Insufficient documentation

## 2014-01-02 MED ORDER — OXYCODONE HCL 5 MG PO TABS: 10.0000 mg | ORAL_TABLET | ORAL | Status: DC | PRN

## 2014-01-02 MED ORDER — MORPHINE SULFATE 4 MG/ML IJ SOLN
4.0000 mg | Freq: Once | INTRAMUSCULAR | Status: AC
  Administered 2014-01-02: 4 mg via INTRAMUSCULAR
  Filled 2014-01-02: qty 1

## 2014-01-02 NOTE — ED Notes (Signed)
Pt states he R foot pain since yesterday. Pt has pain just behind R great toe. Pt denies trauma to foot. Pt ambulatory to exam room with steady gait. Pt has hx of multiple myeloma.

## 2014-01-02 NOTE — ED Notes (Addendum)
Initial contact-A&Ox4. Right foot pain (on ball of foot) started yesterday evening without precipitating factors. No cuts to the foot noted. Pt still ambulatory but with limp. Took percocet last night with no relief. Hx multiple myeloma (dx July 2014) with BMT Feb 2015. Pt has right PAC.  No other complaints.

## 2014-01-02 NOTE — ED Provider Notes (Signed)
CSN: [id number]     Arrival date & time 01/02/14  2019 History   First MD Initiated Contact with Patient 01/02/14 2141     Chief Complaint  Patient presents with  . Foot Pain     (Consider location/radiation/quality/duration/timing/severity/associated sxs/prior Treatment) HPI  Mark [name] is a 43 y.o. male with past medical history significant for multiple myeloma, status post bone marrow transplant in February of 2015 complaining of atraumatic right foot pain onset yesterday around noon. Patient took Percocet with no relief. Mild swelling to the affected area. Denies reduced range of motion, numbness or paresthesia. Patient denies fever, chills, chest pain, shortness of breath, headache, falls.  Past Medical History  Diagnosis Date  . History of radiation therapy 02/07/13- 02/26/13    lower L spine, upper sacrum, 35 gray in 14 fractions  . Cancer   . Multiple myeloma dx'd 01/2013   Past Surgical History  Procedure Laterality Date  . Portacath placement    . Bone marrow transplant     Family History  Problem Relation Age of Onset  . Diabetic kidney disease Mother   . Hypertension Mother   . Heart attack Mother   . Kidney failure Mother   . Coronary artery disease Mother   . HIV Father    History  Substance Use Topics  . Smoking status: Former Smoker -- 1.00 packs/day for 15 years    Types: Cigarettes    Start date: 06/29/1988    Quit date: 06/29/2003  . Smokeless tobacco: Never Used     Comment: quit smoking 10 years ago  . Alcohol Use: No    Review of Systems  10 systems reviewed and found to be negative, except as noted in the HPI.   Allergies  Review of patient's allergies indicates no known allergies.  Home Medications   Prior to Admission medications   Medication Sig Start Date End Date Taking? Authorizing Provider  acyclovir (ZOVIRAX) 800 MG tablet Take 800 mg by mouth 2 (two) times daily.   Yes Historical Provider, MD  bisacodyl (DULCOLAX) 5 MG EC tablet  Take 5 mg by mouth daily as needed for moderate constipation.   Yes Historical Provider, MD  gabapentin (NEURONTIN) 300 MG capsule Take 300-600 mg by mouth 3 (three) times daily. Take 356m in the morning and afternoon and take 6060min the evening   Yes Historical Provider, MD  lenalidomide (REVLIMID) 25 MG capsule Take 25 mg by mouth See admin instructions. Take 2553maily for 21 days then stop for 7 days   Yes Historical Provider, MD  oxyCODONE (OXY IR/ROXICODONE) 5 MG immediate release tablet Take 2 tablets (10 mg total) by mouth every 4 (four) hours as needed for moderate pain or severe pain. 01/02/14   Harish Bram, PA-C  traMADol (ULTRAM) 50 MG tablet Take 1 tablet (50 mg total) by mouth every 6 (six) hours as needed. 12/29/13   KevMirna MiresD   BP 124/82  Pulse 88  Temp(Src) 99 F (37.2 C) (Oral)  Resp 20  SpO2 96% Physical Exam  Nursing note and vitals reviewed. Constitutional: He is oriented to person, place, and time. He appears well-developed and well-nourished. No distress.  HENT:  Head: Normocephalic and atraumatic.  Mouth/Throat: Oropharynx is clear and moist.  Eyes: Conjunctivae and EOM are normal. Pupils are equal, round, and reactive to light.  Neck: Normal range of motion.  Cardiovascular: Normal rate, regular rhythm and intact distal pulses.   Pulmonary/Chest: Effort normal and breath sounds normal.  No stridor. No respiratory distress. He has no wheezes. He has no rales. He exhibits no tenderness.  Abdominal: Soft. Bowel sounds are normal. He exhibits no distension and no mass. There is no tenderness. There is no rebound and no guarding.  Musculoskeletal: Normal range of motion. He exhibits edema and tenderness.       Feet:  Neurological: He is alert and oriented to person, place, and time.  Psychiatric: He has a normal mood and affect.    ED Course  Procedures (including critical care time) Labs Review Labs Reviewed - No data to display  Imaging Review Dg  Foot Complete Right  01/02/2014   CLINICAL DATA:  Medial foot pain.  No known injury.  EXAM: RIGHT FOOT COMPLETE - 3+ VIEW  COMPARISON:  None.  FINDINGS: There is an artifact which affects the frontal images, exam remains diagnostic however such that repeat radiation likely not justified. There is no fracture or acute malalignment. Smooth lucency through the lateral great toe sesamoid is usually an incidental/development finding. No joint narrowing. No evidence of bone lesion in this patient with history of myeloma.  IMPRESSION: Negative.   Electronically Signed   By: Jorje Guild M.D.   On: 01/02/2014 22:17     EKG Interpretation None      MDM   Final diagnoses:  Right foot pain    Filed Vitals:   01/02/14 2048  BP: 124/82  Pulse: 88  Temp: 99 F (37.2 C)  TempSrc: Oral  Resp: 20  SpO2: 96%    Medications  morphine 4 MG/ML injection 4 mg (4 mg Intramuscular Given 01/02/14 2209)    Mark [name] is a 43 y.o. male presenting with atraumatic right foot pain. No sign of infection, very tender to palpation. The patient would benefit from crutches. X-ray with no acute abnormality. Will increase his oxycodone to 10 mg every 4 when necessary pain. 5 close followup with podiatry.  Evaluation does not show pathology that would require ongoing emergent intervention or inpatient treatment. Pt is hemodynamically stable and mentating appropriately. Discussed findings and plan with patient/guardian, who agrees with care plan. All questions answered. Return precautions discussed and outpatient follow up given.       Monico Blitz, PA-C 01/02/14 2311

## 2014-01-02 NOTE — Discharge Instructions (Signed)
Take oxycodone for breakthrough pain, do not drink alcohol, drive, care for children or do other critical tasks while taking oxycodone. ° °Please follow with your primary care doctor in the next 2 days for a check-up. They must obtain records for further management.  ° °Do not hesitate to return to the Emergency Department for any new, worsening or concerning symptoms.  ° °

## 2014-01-03 NOTE — ED Provider Notes (Signed)
Medical screening examination/treatment/procedure(s) were conducted as a shared visit with non-physician practitioner(s) and myself.  I personally evaluated the patient during the encounter.   EKG Interpretation None      I interviewed and examined the patient. Lungs are CTAB. Cardiac exam wnl. Abdomen soft.  Mild swelling and ttp of the plantar distal medial aspect of the right foot. No cutaneous findings. No obvious injury per pt. No fevers. Plain film neg. Doubt infectious process. Will rec RICE. Return precautions provided.   Blanchard Kelch, MD 01/03/14 [phone]

## 2014-01-04 LAB — CULTURE, BLOOD (ROUTINE X 2)
CULTURE: NO GROWTH
Culture: NO GROWTH

## 2014-01-13 ENCOUNTER — Other Ambulatory Visit: Payer: Self-pay | Admitting: *Deleted

## 2014-01-13 ENCOUNTER — Other Ambulatory Visit (HOSPITAL_BASED_OUTPATIENT_CLINIC_OR_DEPARTMENT_OTHER): Payer: BC Managed Care – PPO | Admitting: Lab

## 2014-01-13 ENCOUNTER — Ambulatory Visit (HOSPITAL_BASED_OUTPATIENT_CLINIC_OR_DEPARTMENT_OTHER): Payer: BC Managed Care – PPO | Admitting: Hematology & Oncology

## 2014-01-13 ENCOUNTER — Encounter: Payer: Self-pay | Admitting: Hematology & Oncology

## 2014-01-13 ENCOUNTER — Ambulatory Visit (HOSPITAL_BASED_OUTPATIENT_CLINIC_OR_DEPARTMENT_OTHER): Payer: BC Managed Care – PPO

## 2014-01-13 VITALS — BP 149/74 | HR 72 | Temp 98.1°F | Resp 18 | Ht 70.0 in | Wt 221.0 lb

## 2014-01-13 DIAGNOSIS — C9000 Multiple myeloma not having achieved remission: Secondary | ICD-10-CM

## 2014-01-13 LAB — CBC WITH DIFFERENTIAL (CANCER CENTER ONLY)
BASO#: 0 10*3/uL (ref 0.0–0.2)
BASO%: 0.4 % (ref 0.0–2.0)
EOS%: 11.4 % — AB (ref 0.0–7.0)
Eosinophils Absolute: 0.3 10*3/uL (ref 0.0–0.5)
HEMATOCRIT: 31.9 % — AB (ref 38.7–49.9)
HGB: 10.7 g/dL — ABNORMAL LOW (ref 13.0–17.1)
LYMPH#: 0.6 10*3/uL — AB (ref 0.9–3.3)
LYMPH%: 23.2 % (ref 14.0–48.0)
MCH: 32.3 pg (ref 28.0–33.4)
MCHC: 33.5 g/dL (ref 32.0–35.9)
MCV: 96 fL (ref 82–98)
MONO#: 0.3 10*3/uL (ref 0.1–0.9)
MONO%: 12.2 % (ref 0.0–13.0)
NEUT#: 1.3 10*3/uL — ABNORMAL LOW (ref 1.5–6.5)
NEUT%: 52.8 % (ref 40.0–80.0)
Platelets: 112 10*3/uL — ABNORMAL LOW (ref 145–400)
RBC: 3.31 10*6/uL — ABNORMAL LOW (ref 4.20–5.70)
RDW: 12.9 % (ref 11.1–15.7)
WBC: 2.4 10*3/uL — AB (ref 4.0–10.0)

## 2014-01-13 LAB — CMP (CANCER CENTER ONLY)
ALBUMIN: 3.8 g/dL (ref 3.3–5.5)
ALT(SGPT): 24 U/L (ref 10–47)
AST: 26 U/L (ref 11–38)
Alkaline Phosphatase: 49 U/L (ref 26–84)
BUN, Bld: 12 mg/dL (ref 7–22)
CHLORIDE: 104 meq/L (ref 98–108)
CO2: 31 mEq/L (ref 18–33)
Calcium: 8.8 mg/dL (ref 8.0–10.3)
Creat: 1.2 mg/dl (ref 0.6–1.2)
Glucose, Bld: 111 mg/dL (ref 73–118)
POTASSIUM: 4 meq/L (ref 3.3–4.7)
Sodium: 141 mEq/L (ref 128–145)
Total Bilirubin: 1.8 mg/dl — ABNORMAL HIGH (ref 0.20–1.60)
Total Protein: 6.7 g/dL (ref 6.4–8.1)

## 2014-01-13 MED ORDER — LENALIDOMIDE 10 MG PO CAPS: 10.0000 mg | ORAL_CAPSULE | Freq: Every day | ORAL | Status: DC

## 2014-01-13 MED ORDER — HEPARIN SOD (PORK) LOCK FLUSH 100 UNIT/ML IV SOLN
500.0000 [IU] | Freq: Once | INTRAVENOUS | Status: AC | PRN
  Administered 2014-01-13: 500 [IU]
  Filled 2014-01-13: qty 5

## 2014-01-13 MED ORDER — SODIUM CHLORIDE 0.9 % IV SOLN
Freq: Once | INTRAVENOUS | Status: AC
  Administered 2014-01-13: 15:00:00 via INTRAVENOUS

## 2014-01-13 MED ORDER — SODIUM CHLORIDE 0.9 % IJ SOLN
10.0000 mL | INTRAMUSCULAR | Status: DC | PRN
  Administered 2014-01-13: 10 mL
  Filled 2014-01-13: qty 10

## 2014-01-13 MED ORDER — GABAPENTIN 300 MG PO CAPS: 300.0000 mg | ORAL_CAPSULE | Freq: Three times a day (TID) | ORAL | Status: DC

## 2014-01-13 MED ORDER — ZOLEDRONIC ACID 4 MG/100ML IV SOLN
4.0000 mg | Freq: Once | INTRAVENOUS | Status: AC
  Administered 2014-01-13: 4 mg via INTRAVENOUS
  Filled 2014-01-13: qty 100

## 2014-01-13 NOTE — Progress Notes (Signed)
Hematology and Oncology Follow Up Visit  Mark [name] [id number] October 22, 1970 43 y.o. 01/13/2014   Principle Diagnosis:   Kappa light chain myeloma  Current Therapy:   Revlimid 25 mg by mouth daily (21/7) Zometa 4 mg IV q. month Velcade q 3 week dosing     Interim History:  [name] is back for his followup. He start on Xarelto mid. So far, is doing well with this. He didn't cycles to date. He didn't go to the emergency room recently. Again the right foot swelled up. X-rays were taken and were negative. This got better. There is a swelling in his leg itself.  He's had no diarrhea. He's had no cough shortness of breath.  Want to go back to work. I think it is okay for him to go back to work. I really don't see much in the way restrictions for him.  He is not see the transplant doctors at [facility] probably for another couple months.  Medications: Current outpatient prescriptions:acyclovir (ZOVIRAX) 800 MG tablet, Take 800 mg by mouth 2 (two) times daily., Disp: , Rfl: ;  bisacodyl (DULCOLAX) 5 MG EC tablet, Take 5 mg by mouth daily as needed for moderate constipation., Disp: , Rfl: ;  gabapentin (NEURONTIN) 300 MG capsule, Take 300-600 mg by mouth 3 (three) times daily. Take 300mg  in the morning and afternoon and take 600mg  in the evening, Disp: , Rfl:  oxyCODONE (OXY IR/ROXICODONE) 5 MG immediate release tablet, Take 2 tablets (10 mg total) by mouth every 4 (four) hours as needed for moderate pain or severe pain., Disp: 12 tablet, Rfl: 0;  lenalidomide (REVLIMID) 10 MG capsule, Take 1 capsule (10 mg total) by mouth daily., Disp: 28 capsule, Rfl: 12;  traMADol (ULTRAM) 50 MG tablet, Take 1 tablet (50 mg total) by mouth every 6 (six) hours as needed., Disp: 20 tablet, Rfl: 0 No current facility-administered medications for this visit. Facility-Administered Medications Ordered in Other Visits: 0.9 %  sodium chloride infusion, , Intravenous, Once, [name], MD;  Zoledronic Acid (ZOMETA) 4 mg  IVPB, 4 mg, Intravenous, Once, [name], MD  Allergies: No Known Allergies  Past Medical History, Surgical history, Social history, and Family History were reviewed and updated.  Review of Systems: As above  Physical Exam:  height is 5\' 10"  (1.778 m) and weight is 221 lb (100.245 kg). His oral temperature is 98.1 F (36.7 C). His blood pressure is 149/74 and his pulse is 72. His respiration is 18.   Well-developed and well-nourished African American gentleman. Head and neck exam shows no ocular or oral lesions. He has no palpable cervical or supraclavicular lymph nodes. Lungs are clear bilateral. Cardiac exam regular rate and rhythm with no murmurs rubs or bruits. Abdomen is soft. Has good bowel sounds. There is no fluid wave. There is no palpable liver or spleen. Back exam shows no tenderness over the spine ribs or hips. Extremities shows no clubbing cyanosis or edema. Has good range of motion of his choice. Has good muscle strength. Skin exam shows no rashes ecchymoses or petechia. Neurological exam shows no focal neurological deficits.  Lab Results  Component Value Date   WBC 2.4* 01/13/2014   HGB 10.7* 01/13/2014   HCT 31.9* 01/13/2014   MCV 96 01/13/2014   PLT 112* 01/13/2014     Chemistry      Component Value Date/Time   NA 141 01/13/2014 1212   NA 143 12/29/2013 1410   NA 145 02/21/2013 0908  K 4.0 01/13/2014 1212   K 3.8 12/29/2013 1410   K 3.8 02/21/2013 0908   CL 104 01/13/2014 1212   CL 106 12/29/2013 1410   CO2 31 01/13/2014 1212   CO2 25 12/29/2013 1410   CO2 32* 02/21/2013 0908   BUN 12 01/13/2014 1212   BUN 13 12/29/2013 1410   BUN 19.5 02/21/2013 0908   CREATININE 1.2 01/13/2014 1212   CREATININE 1.09 12/29/2013 1410   CREATININE 1.5* 02/21/2013 0908      Component Value Date/Time   CALCIUM 8.8 01/13/2014 1212   CALCIUM 8.1* 12/29/2013 1410   CALCIUM 17.7 Repeated and Verified* 02/21/2013 0908   CALCIUM >15.0* 02/01/2013 1230   ALKPHOS 49 01/13/2014 1212   ALKPHOS 60  12/29/2013 1410   AST 26 01/13/2014 1212   AST 18 12/29/2013 1410   ALT 24 01/13/2014 1212   ALT 23 12/29/2013 1410   BILITOT 1.80* 01/13/2014 1212   BILITOT 1.3* 12/29/2013 1410         Impression and Plan: Mark [name] is 43 year old gentleman with Kappa light chain myeloma. He ultimately required aggressive chemotherapy and to get him into remission. We had to use the VD-PACE regimen. With this, we did get him into remission. His a light chains normalized. His bone marrow pretty much normalized. He still had quite a few chromosomal abnormalities. Everyone to when his transplant at [facility] back in January.  I'm giving him another 24-hour urine to do so we can see how his light chains are.  I thought about a bone marrow on him. Not sure, if this will help right now. We will see what his urine shows. This may change my mind.  I will do his Velcade every 2 weeks.  After this cycle of Revlimid, he will go to 10 mg a day of the Revlimid.  I'll plan to see him back in another 4 or 5 weeks.   [name], MD 6/15/20152:28 PM

## 2014-01-13 NOTE — Patient Instructions (Signed)

## 2014-01-14 ENCOUNTER — Telehealth: Payer: Self-pay | Admitting: Hematology & Oncology

## 2014-01-14 NOTE — Telephone Encounter (Signed)
[facility] - NPR  J7050 PR NORMAL SALINE SOLUTION INFUS J3489 PR ZOLEDRONIC ACID 1MG  J3490 PR DRUGS UNCLASSIFIED INJECTION  J1642 PR INJ HEPARIN SODIUM PER  J9041 J2505 - PREDETERMINATION FORM   Multiple myeloma - Primary 203.00

## 2014-01-15 ENCOUNTER — Other Ambulatory Visit: Payer: BC Managed Care – PPO | Admitting: Lab

## 2014-01-16 ENCOUNTER — Emergency Department (HOSPITAL_COMMUNITY)
Admission: EM | Admit: 2014-01-16 | Discharge: 2014-01-16 | Disposition: A | Discharge status: Home / Self Care | Payer: BC Managed Care – PPO | Attending: Emergency Medicine | Admitting: Emergency Medicine

## 2014-01-16 ENCOUNTER — Emergency Department (HOSPITAL_COMMUNITY): Payer: BC Managed Care – PPO

## 2014-01-16 ENCOUNTER — Encounter (HOSPITAL_COMMUNITY): Payer: Self-pay | Admitting: Emergency Medicine

## 2014-01-16 DIAGNOSIS — C9000 Multiple myeloma not having achieved remission: Secondary | ICD-10-CM | POA: Insufficient documentation

## 2014-01-16 DIAGNOSIS — S335XXA Sprain of ligaments of lumbar spine, initial encounter: Secondary | ICD-10-CM | POA: Insufficient documentation

## 2014-01-16 DIAGNOSIS — S39012A Strain of muscle, fascia and tendon of lower back, initial encounter: Secondary | ICD-10-CM

## 2014-01-16 DIAGNOSIS — Y92410 Unspecified street and highway as the place of occurrence of the external cause: Secondary | ICD-10-CM | POA: Insufficient documentation

## 2014-01-16 DIAGNOSIS — Z79899 Other long term (current) drug therapy: Secondary | ICD-10-CM | POA: Insufficient documentation

## 2014-01-16 DIAGNOSIS — Z859 Personal history of malignant neoplasm, unspecified: Secondary | ICD-10-CM | POA: Insufficient documentation

## 2014-01-16 DIAGNOSIS — S5010XA Contusion of unspecified forearm, initial encounter: Secondary | ICD-10-CM

## 2014-01-16 DIAGNOSIS — Y9389 Activity, other specified: Secondary | ICD-10-CM | POA: Insufficient documentation

## 2014-01-16 DIAGNOSIS — Z87891 Personal history of nicotine dependence: Secondary | ICD-10-CM | POA: Insufficient documentation

## 2014-01-16 DIAGNOSIS — Z923 Personal history of irradiation: Secondary | ICD-10-CM | POA: Insufficient documentation

## 2014-01-16 LAB — IGG, IGA, IGM
IGG (IMMUNOGLOBIN G), SERUM: 944 mg/dL (ref 650–1600)
IgA: 42 mg/dL — ABNORMAL LOW (ref 68–379)
IgM, Serum: 26 mg/dL — ABNORMAL LOW (ref 41–251)

## 2014-01-16 LAB — KAPPA/LAMBDA LIGHT CHAINS
KAPPA FREE LGHT CHN: 3.17 mg/dL — AB (ref 0.33–1.94)
Kappa:Lambda Ratio: 1.52 (ref 0.26–1.65)
Lambda Free Lght Chn: 2.09 mg/dL (ref 0.57–2.63)

## 2014-01-16 LAB — PROTEIN ELECTROPHORESIS, SERUM, WITH REFLEX
ALBUMIN ELP: 64 % (ref 55.8–66.1)
ALPHA-2-GLOBULIN: 8.6 % (ref 7.1–11.8)
Alpha-1-Globulin: 4.4 % (ref 2.9–4.9)
BETA 2: 3.6 % (ref 3.2–6.5)
Beta Globulin: 5.5 % (ref 4.7–7.2)
Gamma Globulin: 13.9 % (ref 11.1–18.8)
TOTAL PROTEIN, SERUM ELECTROPHOR: 6.6 g/dL (ref 6.0–8.3)

## 2014-01-16 LAB — BETA 2 MICROGLOBULIN, SERUM: Beta-2 Microglobulin: 3.05 mg/L — ABNORMAL HIGH (ref <=2.51)

## 2014-01-16 LAB — LACTATE DEHYDROGENASE: LDH: 210 U/L (ref 94–250)

## 2014-01-16 LAB — CARDIOLIPIN ANTIBODIES, IGG, IGM, IGA
ANTICARDIOLIPIN IGA: 3 U/mL (ref <22)
ANTICARDIOLIPIN IGG: 7 GPL U/mL (ref <23)
Anticardiolipin IgM: 1 MPL U/mL (ref <11)

## 2014-01-16 LAB — IFE INTERPRETATION

## 2014-01-16 MED ORDER — DIAZEPAM 5 MG PO TABS: 5.0000 mg | ORAL_TABLET | Freq: Four times a day (QID) | ORAL | Status: DC | PRN
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
## 2014-01-16 NOTE — ED Notes (Signed)
Pt A&OX4, ambulatory at d/c with steady gait, declined wheelchair, NAD.

## 2014-01-16 NOTE — ED Notes (Signed)
Dr Pickering at bedside 
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
## 2014-01-20 LAB — UIFE/LIGHT CHAINS/TP QN, 24-HR UR
ALPHA 2 UR: DETECTED — AB
Albumin, U: DETECTED
Alpha 1, Urine: DETECTED — AB
BETA UR: DETECTED — AB
Free Kappa Lt Chains,Ur: 50.7 mg/dL — ABNORMAL HIGH (ref 0.14–2.42)
Free Kappa/Lambda Ratio: 20.78 ratio — ABNORMAL HIGH (ref 2.04–10.37)
Free Lambda Excretion/Day: 39.04 mg/d
Free Lambda Lt Chains,Ur: 2.44 mg/dL — ABNORMAL HIGH (ref 0.02–0.67)
Free Lt Chn Excr Rate: 811.2 mg/d
GAMMA UR: DETECTED — AB
Time: 24 hours
Total Protein, Urine-Ur/day: 867 mg/d — ABNORMAL HIGH (ref 10–140)
Total Protein, Urine: 54.2 mg/dL
Volume, Urine: 1600 mL

## 2014-01-22 ENCOUNTER — Telehealth: Payer: Self-pay | Admitting: *Deleted

## 2014-01-22 NOTE — Telephone Encounter (Addendum)
Message copied by Lenn Sink on Wed Jan 22, 2014 10:40 AM ------      Message from: Burney Gauze R      Created: Wed Jan 22, 2014  6:43 AM       Call - myeloma level is still nice and low in the urine!!  Laurey Arrow ------Informed pt that myeloma level is still nice and low in the urine

## 2014-01-23 ENCOUNTER — Ambulatory Visit: Payer: Self-pay

## 2014-01-23 ENCOUNTER — Ambulatory Visit: Payer: Self-pay | Admitting: Hematology & Oncology

## 2014-01-23 ENCOUNTER — Other Ambulatory Visit: Payer: Self-pay | Admitting: Lab

## 2014-01-27 ENCOUNTER — Ambulatory Visit (HOSPITAL_BASED_OUTPATIENT_CLINIC_OR_DEPARTMENT_OTHER): Payer: BC Managed Care – PPO

## 2014-01-27 ENCOUNTER — Other Ambulatory Visit: Payer: Self-pay | Admitting: Oncology

## 2014-01-27 ENCOUNTER — Encounter: Payer: Self-pay | Admitting: *Deleted

## 2014-01-27 VITALS — BP 133/91 | HR 82 | Temp 98.2°F | Resp 18

## 2014-01-27 DIAGNOSIS — C9000 Multiple myeloma not having achieved remission: Secondary | ICD-10-CM

## 2014-01-27 DIAGNOSIS — Z5112 Encounter for antineoplastic immunotherapy: Secondary | ICD-10-CM

## 2014-01-27 MED ORDER — BORTEZOMIB CHEMO SQ INJECTION 3.5 MG (2.5MG/ML)
1.3000 mg/m2 | Freq: Once | INTRAMUSCULAR | Status: AC
  Administered 2014-01-27: 2.75 mg via SUBCUTANEOUS
  Filled 2014-01-27: qty 2.75

## 2014-01-27 MED ORDER — ONDANSETRON HCL 8 MG PO TABS
8.0000 mg | ORAL_TABLET | Freq: Once | ORAL | Status: AC
Start: 2014-01-27 — End: 2014-01-27
  Administered 2014-01-27: 8 mg via ORAL

## 2014-01-27 MED ORDER — PROCHLORPERAZINE MALEATE 10 MG PO TABS: 10.0000 mg | ORAL_TABLET | Freq: Four times a day (QID) | ORAL | Status: DC | PRN

## 2014-01-27 MED ORDER — ONDANSETRON HCL 8 MG PO TABS
ORAL_TABLET | ORAL | Status: AC
  Filled 2014-01-27: qty 1

## 2014-01-27 NOTE — Patient Instructions (Signed)
Bortezomib injection What is this medicine? BORTEZOMIB (bor TEZ oh mib) is a chemotherapy drug. It slows the growth of cancer cells. This medicine is used to treat multiple myeloma, and certain lymphomas, such as mantle-cell lymphoma. This medicine may be used for other purposes; ask your health care provider or pharmacist if you have questions. COMMON BRAND NAME(S): Velcade What should I tell my health care provider before I take this medicine? They need to know if you have any of these conditions: -diabetes -heart disease -irregular heartbeat -liver disease -on hemodialysis -low blood counts, like low white blood cells, platelets, or hemoglobin -peripheral neuropathy -taking medicine for blood pressure -an unusual or allergic reaction to bortezomib, mannitol, boron, other medicines, foods, dyes, or preservatives -pregnant or trying to get pregnant -breast-feeding How should I use this medicine? This medicine is for injection into a vein or for injection under the skin. It is given by a health care professional in a hospital or clinic setting. Talk to your pediatrician regarding the use of this medicine in children. Special care may be needed. Overdosage: If you think you have taken too much of this medicine contact a poison control center or emergency room at once. NOTE: This medicine is only for you. Do not share this medicine with others. What if I miss a dose? It is important not to miss your dose. Call your doctor or health care professional if you are unable to keep an appointment. What may interact with this medicine? This medicine may interact with the following medications: -ketoconazole -rifampin -ritonavir -St. John's Wort This list may not describe all possible interactions. Give your health care provider a list of all the medicines, herbs, non-prescription drugs, or dietary supplements you use. Also tell them if you smoke, drink alcohol, or use illegal drugs. Some items  may interact with your medicine. What should I watch for while using this medicine? Visit your doctor for checks on your progress. This drug may make you feel generally unwell. This is not uncommon, as chemotherapy can affect healthy cells as well as cancer cells. Report any side effects. Continue your course of treatment even though you feel ill unless your doctor tells you to stop. You may get drowsy or dizzy. Do not drive, use machinery, or do anything that needs mental alertness until you know how this medicine affects you. Do not stand or sit up quickly, especially if you are an older patient. This reduces the risk of dizzy or fainting spells. In some cases, you may be given additional medicines to help with side effects. Follow all directions for their use. Call your doctor or health care professional for advice if you get a fever, chills or sore throat, or other symptoms of a cold or flu. Do not treat yourself. This drug decreases your body's ability to fight infections. Try to avoid being around people who are sick. This medicine may increase your risk to bruise or bleed. Call your doctor or health care professional if you notice any unusual bleeding. You may need blood work done while you are taking this medicine. In some patients, this medicine may cause a serious brain infection that may cause death. If you have any problems seeing, thinking, speaking, walking, or standing, tell your doctor right away. If you cannot reach your doctor, urgently seek other source of medical care. Do not become pregnant while taking this medicine. Women should inform their doctor if they wish to become pregnant or think they might be pregnant. There is   a potential for serious side effects to an unborn child. Talk to your health care professional or pharmacist for more information. Do not breast-feed an infant while taking this medicine. Check with your doctor or health care professional if you get an attack of  severe diarrhea, nausea and vomiting, or if you sweat a lot. The loss of too much body fluid can make it dangerous for you to take this medicine. What side effects may I notice from receiving this medicine? Side effects that you should report to your doctor or health care professional as soon as possible: -allergic reactions like skin rash, itching or hives, swelling of the face, lips, or tongue -breathing problems -changes in hearing -changes in vision -fast, irregular heartbeat -feeling faint or lightheaded, falls -pain, tingling, numbness in the hands or feet -right upper belly pain -seizures -swelling of the ankles, feet, hands -unusual bleeding or bruising -unusually weak or tired -vomiting -yellowing of the eyes or skin Side effects that usually do not require medical attention (report to your doctor or health care professional if they continue or are bothersome): -changes in emotions or moods -constipation -diarrhea -loss of appetite -headache -irritation at site where injected -nausea This list may not describe all possible side effects. Call your doctor for medical advice about side effects. You may report side effects to FDA at 1-800-FDA-1088. Where should I keep my medicine? This drug is given in a hospital or clinic and will not be stored at home. NOTE: This sheet is a summary. It may not cover all possible information. If you have questions about this medicine, talk to your doctor, pharmacist, or health care provider.  2015, [employer]vier/Gold Standard. (2013-05-13 12:46:32)  

## 2014-01-27 NOTE — Progress Notes (Signed)
Received fax from [facility] stating that the patient's prescription shipped on 01/24/14 and was scheduled for delivery on 01/25/14.

## 2014-01-28 ENCOUNTER — Encounter: Payer: Self-pay | Admitting: Hematology & Oncology

## 2014-01-29 ENCOUNTER — Encounter: Payer: Self-pay | Admitting: *Deleted

## 2014-02-04 ENCOUNTER — Telehealth: Payer: Self-pay | Admitting: Hematology & Oncology

## 2014-02-04 NOTE — Telephone Encounter (Signed)
Patient called and cx 02/14/14 and resch 02/19/14

## 2014-02-14 ENCOUNTER — Other Ambulatory Visit: Payer: Self-pay | Admitting: Lab

## 2014-02-14 ENCOUNTER — Ambulatory Visit: Payer: Self-pay

## 2014-02-14 ENCOUNTER — Ambulatory Visit: Payer: Self-pay | Admitting: Hematology & Oncology

## 2014-02-19 ENCOUNTER — Ambulatory Visit (HOSPITAL_BASED_OUTPATIENT_CLINIC_OR_DEPARTMENT_OTHER)
Admission: RE | Admit: 2014-02-19 | Discharge: 2014-02-19 | Disposition: A | Discharge status: Home / Self Care | Payer: BC Managed Care – PPO | Source: Ambulatory Visit | Attending: Hematology & Oncology | Admitting: Hematology & Oncology

## 2014-02-19 ENCOUNTER — Ambulatory Visit (HOSPITAL_BASED_OUTPATIENT_CLINIC_OR_DEPARTMENT_OTHER): Payer: BC Managed Care – PPO | Admitting: Hematology & Oncology

## 2014-02-19 ENCOUNTER — Other Ambulatory Visit: Payer: Self-pay | Admitting: *Deleted

## 2014-02-19 ENCOUNTER — Encounter: Payer: Self-pay | Admitting: Hematology & Oncology

## 2014-02-19 ENCOUNTER — Ambulatory Visit (HOSPITAL_BASED_OUTPATIENT_CLINIC_OR_DEPARTMENT_OTHER): Payer: BC Managed Care – PPO

## 2014-02-19 ENCOUNTER — Other Ambulatory Visit (HOSPITAL_BASED_OUTPATIENT_CLINIC_OR_DEPARTMENT_OTHER): Payer: BC Managed Care – PPO | Admitting: Lab

## 2014-02-19 VITALS — BP 123/64 | HR 97 | Temp 99.1°F | Resp 18 | Ht 69.0 in | Wt 210.0 lb

## 2014-02-19 DIAGNOSIS — J189 Pneumonia, unspecified organism: Secondary | ICD-10-CM

## 2014-02-19 DIAGNOSIS — R509 Fever, unspecified: Secondary | ICD-10-CM | POA: Insufficient documentation

## 2014-02-19 DIAGNOSIS — Z9481 Bone marrow transplant status: Secondary | ICD-10-CM | POA: Insufficient documentation

## 2014-02-19 DIAGNOSIS — C9000 Multiple myeloma not having achieved remission: Secondary | ICD-10-CM | POA: Insufficient documentation

## 2014-02-19 DIAGNOSIS — J181 Lobar pneumonia, unspecified organism: Secondary | ICD-10-CM

## 2014-02-19 DIAGNOSIS — J984 Other disorders of lung: Secondary | ICD-10-CM | POA: Insufficient documentation

## 2014-02-19 DIAGNOSIS — M439 Deforming dorsopathy, unspecified: Secondary | ICD-10-CM | POA: Insufficient documentation

## 2014-02-19 LAB — CBC WITH DIFFERENTIAL (CANCER CENTER ONLY)
BASO#: 0 10*3/uL (ref 0.0–0.2)
BASO%: 0 % (ref 0.0–2.0)
EOS%: 2.5 % (ref 0.0–7.0)
Eosinophils Absolute: 0.1 10*3/uL (ref 0.0–0.5)
HCT: 29.1 % — ABNORMAL LOW (ref 38.7–49.9)
HEMOGLOBIN: 9.9 g/dL — AB (ref 13.0–17.1)
LYMPH#: 0.4 10*3/uL — ABNORMAL LOW (ref 0.9–3.3)
LYMPH%: 18.1 % (ref 14.0–48.0)
MCH: 32.4 pg (ref 28.0–33.4)
MCHC: 34 g/dL (ref 32.0–35.9)
MCV: 95 fL (ref 82–98)
MONO#: 0.3 10*3/uL (ref 0.1–0.9)
MONO%: 13.1 % — AB (ref 0.0–13.0)
NEUT%: 66.3 % (ref 40.0–80.0)
NEUTROS ABS: 1.6 10*3/uL (ref 1.5–6.5)
PLATELETS: 90 10*3/uL — AB (ref 145–400)
RBC: 3.06 10*6/uL — ABNORMAL LOW (ref 4.20–5.70)
RDW: 14.3 % (ref 11.1–15.7)
WBC: 2.4 10*3/uL — ABNORMAL LOW (ref 4.0–10.0)

## 2014-02-19 LAB — CMP (CANCER CENTER ONLY)
ALBUMIN: 3.9 g/dL (ref 3.3–5.5)
ALK PHOS: 42 U/L (ref 26–84)
ALT(SGPT): 21 U/L (ref 10–47)
AST: 25 U/L (ref 11–38)
BILIRUBIN TOTAL: 1.9 mg/dL — AB (ref 0.20–1.60)
BUN: 22 mg/dL (ref 7–22)
CO2: 27 mEq/L (ref 18–33)
Calcium: 8 mg/dL (ref 8.0–10.3)
Chloride: 101 mEq/L (ref 98–108)
Creat: 1.5 mg/dl — ABNORMAL HIGH (ref 0.6–1.2)
Glucose, Bld: 132 mg/dL — ABNORMAL HIGH (ref 73–118)
POTASSIUM: 3.7 meq/L (ref 3.3–4.7)
Sodium: 137 mEq/L (ref 128–145)
Total Protein: 7 g/dL (ref 6.4–8.1)

## 2014-02-19 MED ORDER — LENALIDOMIDE 10 MG PO CAPS: 10.0000 mg | ORAL_CAPSULE | Freq: Every day | ORAL | Status: DC

## 2014-02-19 MED ORDER — LEVOFLOXACIN 500 MG PO TABS: 500.0000 mg | ORAL_TABLET | Freq: Every day | ORAL | Status: DC

## 2014-02-19 MED ORDER — SODIUM CHLORIDE 0.9 % IV SOLN
Freq: Once | INTRAVENOUS | Status: AC
  Administered 2014-02-19: 16:00:00 via INTRAVENOUS

## 2014-02-19 MED ORDER — HEPARIN SOD (PORK) LOCK FLUSH 100 UNIT/ML IV SOLN
500.0000 [IU] | Freq: Once | INTRAVENOUS | Status: AC
  Administered 2014-02-19: 500 [IU] via INTRAVENOUS
  Filled 2014-02-19: qty 5

## 2014-02-19 MED ORDER — SODIUM CHLORIDE 0.9 % IJ SOLN
10.0000 mL | INTRAMUSCULAR | Status: DC | PRN
  Administered 2014-02-19: 10 mL via INTRAVENOUS
  Filled 2014-02-19: qty 10

## 2014-02-19 MED ORDER — LEVOFLOXACIN IN D5W 750 MG/150ML IV SOLN
750.0000 mg | Freq: Once | INTRAVENOUS | Status: AC
  Administered 2014-02-19: 750 mg via INTRAVENOUS
  Filled 2014-02-19: qty 150

## 2014-02-19 NOTE — Patient Instructions (Addendum)
Levofloxacin injection What is this medicine? LEVOFLOXACIN (lee voe FLOX a sin) is a quinolone antibiotic. It is used to treat certain kinds of bacterial infections. It will not work for colds, flu, or other viral infections. This medicine may be used for other purposes; ask your health care provider or pharmacist if you have questions. COMMON BRAND NAME(S): Levaquin What should I tell my health care provider before I take this medicine? They need to know if you have any of these conditions: -bone problems -cerebral disease -history of low levels of potassium in the blood -irregular heartbeat -joint problems -kidney disease -myasthenia gravis -seizure disorder -tendon problems -an unusual or allergic reaction to levofloxacin, other quinolone antibiotics, foods, dyes, or preservatives -pregnant or trying to get pregnant -breast-feeding How should I use this medicine? This medicine is for infusion into a vein. It is usually given by a health care professional in a hospital or clinic setting. If you get this medicine at home, you will be taught how to prepare and give this medicine. Use exactly as directed. Take your medicine at regular intervals. Do not take your medicine more often than directed. It is important that you put your used needles and syringes in a special sharps container. Do not put them in a trash can. If you do not have a sharps container, call your pharmacist or healthcare provider to get one. A special MedGuide will be given to you by the pharmacist with each prescription and refill. Be sure to read this information carefully each time. Talk to your pediatrician regarding the use of this medicine in children. While this drug may be prescribed for children as young as 6 months for selected conditions, precautions do apply. Overdosage: If you think you have taken too much of this medicine contact a poison control center or emergency room at once. NOTE: This medicine is only  for you. Do not share this medicine with others. What if I miss a dose? If you miss a dose, use it as soon as you can. If it is almost time for your next dose, use only that dose. Do not use double or extra doses. What may interact with this medicine? Do not take this medicine with any of the following medications - arsenic trioxide - chloroquine - droperidol - medications for irregular rhythm like amiodarone, disopyramide, dofetilide, flecainide, quinidine, procainamide, sotalol - some medicines for depression or mental problems like phenothiazines, pimozide, and ziprasidone This medicine may also interact with the following medications - amoxapine - birth control pills - cisapride - haloperidol -NSAIDS, medicines for pain and inflammation, like ibuprofen or naproxen - retinoid products like tretinoin or isotretinoin - risperidone - some other antibiotics like clarithromycin or erythromycin - theophylline - warfarin This list may not describe all possible interactions. Give your health care provider a list of all the medicines, herbs, non-prescription drugs, or dietary supplements you use. Also tell them if you smoke, drink alcohol, or use illegal drugs. Some items may interact with your medicine. What should I watch for while using this medicine? Tell your doctor or health care professional if your symptoms do not begin to improve in a few days. Drink several glasses of water a day and cut down on drinks that contain caffeine. You must not get dehydrated while taking this medicine. You may get drowsy or dizzy. Do not drive, use machinery, or do anything that needs mental alertness until you know how this medicine affects you. Do not sit or stand up quickly, especially   if you are an older patient. This reduces the risk of dizzy or fainting spells. This medicine can make you more sensitive to the sun. Keep out of the sun. If you cannot avoid being in the sun, wear protective  clothing and use a sunscreen. Do not use sun lamps or tanning beds/booths. Contact your doctor if you get a sunburn. If you are a diabetic monitor your blood glucose carefully. If you get an unusual reading stop taking this medicine and call your doctor right away. Do not treat diarrhea with over-the-counter products. Contact your doctor if you have diarrhea that lasts more than 2 days or if the diarrhea is severe and watery. What side effects may I notice from receiving this medicine? Side effects that you should report to your doctor or health care professional as soon as possible: -allergic reactions like skin rash or hives, swelling of the face, lips, or tongue -changes in vision -confusion, nightmares or hallucinations -difficulty breathing -irregular heartbeat, chest pain -joint, muscle or tendon pain -pain or difficulty passing urine -persistent headache with or without blurred vision -redness, blistering, peeling or loosening of the skin, including inside the mouth< -seizures -unusual pain, numbness, tingling, or weakness -vaginal irritation, discharge Side effects that usually do not require medical attention (report to your doctor or health care professional if they continue or are bothersome): -diarrhea -dry mouth -headache -pain, irritation at the site of injection -stomach upset, nausea -trouble sleeping This list may not describe all possible side effects. Call your doctor for medical advice about side effects. You may report side effects to FDA at 1-800-FDA-1088. Where should I keep my medicine? Keep out of the reach of children. If you are using this medicine at home, you will be instructed on how to store this medicine. Throw away any unused medicine after the expiration date on the label. NOTE: This sheet is a summary. It may not cover all possible information. If you have questions about this medicine, talk to your doctor, pharmacist, or health care provider.  2015,  [employer]vier/Gold Standard. (2013-03-21 10:54:33) Pneumonia, Adult Pneumonia is an infection of the lungs. It may be caused by a germ (virus or bacteria). Some types of pneumonia can spread easily from person to person. This can happen when you cough or sneeze. HOME CARE  Only take medicine as told by your doctor.  Take your medicine (antibiotics) as told. Finish it even if you start to feel better.  Do not smoke.  You may use a vaporizer or humidifier in your room. This can help loosen thick spit (mucus).  Sleep so you are almost sitting up (semi-upright). This helps reduce coughing.  Rest. A shot (vaccine) can help prevent pneumonia. Shots are often advised for:  People over 65 years old.  Patients on chemotherapy.  People with long-term (chronic) lung problems.  People with immune system problems. GET HELP RIGHT AWAY IF:   You are getting worse.  You cannot control your cough, and you are losing sleep.  You cough up blood.  Your pain gets worse, even with medicine.  You have a fever.  Any of your problems are getting worse, not better.  You have shortness of breath or chest pain. MAKE SURE YOU:   Understand these instructions.  Will watch your condition.  Will get help right away if you are not doing well or get worse. Document Released: 01/04/2008 Document Revised: 10/10/2011 Document Reviewed: 10/08/2010 ExitCare Patient Information 2015 [employer]. This information is not intended to replace   advice given to you by your health care provider. Make sure you discuss any questions you have with your health care provider.   

## 2014-02-20 ENCOUNTER — Ambulatory Visit: Payer: Self-pay | Admitting: Hematology & Oncology

## 2014-02-20 ENCOUNTER — Ambulatory Visit: Payer: Self-pay

## 2014-02-20 ENCOUNTER — Other Ambulatory Visit: Payer: Self-pay | Admitting: Lab

## 2014-02-20 NOTE — Progress Notes (Signed)
Hematology and Oncology Follow Up Visit  Mark [name] [id number] [birth year] 43 y.o. 02/20/2014   Principle Diagnosis:   Kappa light chain myeloma  Current Therapy:   Revlimid 10mg  po q day Zometa 4 mg IV q. month Velcade q 3 week dosing     Interim History:  [name] is back for followup. Unfortunately, his not looking good at all. He had a temperature of 102.7 yesterday. He was at work. He felt chilled at work.  He has been taking Aleve and, all. He says the temperature goes down but then comes right back. There is had a lot of sweating. He's had no shakes.  He denies any cough. He's had no nausea vomiting. There's been no diarrhea.  He's had no rashes. There's been no bleeding.  He's had no contact with anybody who has been obviously sick.  We did go ahead and get a chest x-ray on him. This did show an infiltrate in the left lung consistent with pneumonia.  We will go ahead and give him some IV antibiotics in the office with the [facility]. I'll have him on some oral antibiotics with Levaquin.  He will not get any treatment today. I told him to hold the Revlimid until Monday. Breath otherwise, he's been doing pretty well. He has had no bony pain. He's had no headache. There's been no visual problems.  Medications: Current outpatient prescriptions:acyclovir (ZOVIRAX) 800 MG tablet, Take 800 mg by mouth 2 (two) times daily., Disp: , Rfl: ;  bisacodyl (DULCOLAX) 5 MG EC tablet, Take 5 mg by mouth daily as needed for moderate constipation., Disp: , Rfl: ;  diazepam (VALIUM) 5 MG tablet, Take 1 tablet (5 mg total) by mouth every 6 (six) hours as needed for muscle spasms., Disp: 10 tablet, Rfl: 0 gabapentin (NEURONTIN) 300 MG capsule, Take 1-2 capsules (300-600 mg total) by mouth 3 (three) times daily. Take 300mg  in the morning and afternoon and take 600mg  in the evening, Disp: 120 capsule, Rfl: 3;  ibuprofen (ADVIL,MOTRIN) 200 MG tablet, Take 400 mg by mouth every 6 (six) hours as needed.,  Disp: , Rfl: ;  lenalidomide (REVLIMID) 10 MG capsule, Take 1 capsule (10 mg total) by mouth daily., Disp: 28 capsule, Rfl: 0 oxyCODONE (OXY IR/ROXICODONE) 5 MG immediate release tablet, Take 5 mg by mouth every 4 (four) hours as needed for severe pain., Disp: , Rfl: ;  prochlorperazine (COMPAZINE) 10 MG tablet, Take 1 tablet (10 mg total) by mouth every 6 (six) hours as needed for nausea or vomiting., Disp: 30 tablet, Rfl: 1;  levofloxacin (LEVAQUIN) 500 MG tablet, Take 1 tablet (500 mg total) by mouth daily., Disp: 9 tablet, Rfl: 0  Allergies: No Known Allergies  Past Medical History, Surgical history, Social history, and Family History were reviewed and updated.  Review of Systems: As above  Physical Exam:  height is 5\' 9"  (1.753 m) and weight is 210 lb (95.255 kg). His oral temperature is 99.1 F (37.3 C). His blood pressure is 123/64 and his pulse is 97. His respiration is 18.   Ill-appearing African American gentleman. He is alert and oriented x3. Vital signs are stable. Head and neck exam shows no ocular or oral lesions. He has no palpable cervical or supraclavicular lymph nodes. Oral cavity is totally dry. Lungs are clear. Cardiac exam regular rhythm with no murmurs rubs or bruits. Abdomen is soft. She has good bowel sounds. There is no fluid wave. There is no palpable liver or spleen tip. Back  exam no tenderness over the spine ribs or hips. Extremities shows no clubbing cyanosis or edema. Skin exam no rashes. Neurological exam is nonfocal.  Lab Results  Component Value Date   WBC 2.4* 02/19/2014   HGB 9.9* 02/19/2014   HCT 29.1* 02/19/2014   MCV 95 02/19/2014   PLT 90* 02/19/2014     Chemistry      Component Value Date/Time   NA 137 02/19/2014 1447   NA 143 12/29/2013 1410   NA 145 02/21/2013 0908   K 3.7 02/19/2014 1447   K 3.8 12/29/2013 1410   K 3.8 02/21/2013 0908   CL 101 02/19/2014 1447   CL 106 12/29/2013 1410   CO2 27 02/19/2014 1447   CO2 25 12/29/2013 1410   CO2 32* 02/21/2013  0908   BUN 22 02/19/2014 1447   BUN 13 12/29/2013 1410   BUN 19.5 02/21/2013 0908   CREATININE 1.5* 02/19/2014 1447   CREATININE 1.09 12/29/2013 1410   CREATININE 1.5* 02/21/2013 0908      Component Value Date/Time   CALCIUM 8.0 02/19/2014 1447   CALCIUM 8.1* 12/29/2013 1410   CALCIUM 17.7 Repeated and Verified* 02/21/2013 0908   CALCIUM >15.0* 02/01/2013 1230   ALKPHOS 42 02/19/2014 1447   ALKPHOS 60 12/29/2013 1410   AST 25 02/19/2014 1447   AST 18 12/29/2013 1410   ALT 21 02/19/2014 1447   ALT 23 12/29/2013 1410   BILITOT 1.90* 02/19/2014 1447   BILITOT 1.3* 12/29/2013 1410         Impression and Plan: Mark [name] is 43 year old done. 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 This was in January or early February.  He now has pneumonia.  Again, we are holding his chemotherapy for now.  He is hemodynamically stable so I think we can treat him as an outpatient.  We will give him some fluids in the office.  I don't think is any blood.  His white cell count is a little on the low side. We will have to watch this.  I want him back in 2 weeks.  We will see about getting him started on treatment then.  I told him that he is not to go back to work until Monday. I wrote him a note for this.  Hopefully, this is as an isolated episode and that he will not have any recurrent bouts of pneumonia. If he does, that we may have to consider IVIG.   [name], MD 7/23/20155:39 PM

## 2014-02-21 ENCOUNTER — Telehealth: Payer: Self-pay | Admitting: *Deleted

## 2014-02-21 NOTE — Telephone Encounter (Signed)
Called to see how pt is feeling. Pt didn't answer the call. Informed pt to call back.

## 2014-02-25 ENCOUNTER — Inpatient Hospital Stay (HOSPITAL_COMMUNITY): Payer: BC Managed Care – PPO

## 2014-02-25 ENCOUNTER — Ambulatory Visit (HOSPITAL_BASED_OUTPATIENT_CLINIC_OR_DEPARTMENT_OTHER)
Admission: RE | Admit: 2014-02-25 | Discharge: 2014-02-25 | Disposition: A | Discharge status: Home / Self Care | Payer: BC Managed Care – PPO | Source: Ambulatory Visit | Attending: Hematology & Oncology | Admitting: Hematology & Oncology

## 2014-02-25 ENCOUNTER — Inpatient Hospital Stay (HOSPITAL_COMMUNITY)
Admission: AD | Admit: 2014-02-25 | Discharge: 2014-02-28 | DRG: 840 | Disposition: A | Discharge status: Home / Self Care | Payer: BC Managed Care – PPO | Source: Ambulatory Visit | Attending: Hematology & Oncology | Admitting: Hematology & Oncology

## 2014-02-25 ENCOUNTER — Other Ambulatory Visit: Payer: Self-pay

## 2014-02-25 ENCOUNTER — Ambulatory Visit (HOSPITAL_BASED_OUTPATIENT_CLINIC_OR_DEPARTMENT_OTHER): Payer: BC Managed Care – PPO | Admitting: Hematology & Oncology

## 2014-02-25 ENCOUNTER — Encounter (HOSPITAL_COMMUNITY): Payer: Self-pay

## 2014-02-25 ENCOUNTER — Other Ambulatory Visit (HOSPITAL_BASED_OUTPATIENT_CLINIC_OR_DEPARTMENT_OTHER): Payer: BC Managed Care – PPO | Admitting: Lab

## 2014-02-25 VITALS — BP 120/84 | HR 110 | Temp 99.3°F | Resp 18

## 2014-02-25 DIAGNOSIS — S22009A Unspecified fracture of unspecified thoracic vertebra, initial encounter for closed fracture: Secondary | ICD-10-CM | POA: Diagnosis present

## 2014-02-25 DIAGNOSIS — B9789 Other viral agents as the cause of diseases classified elsewhere: Secondary | ICD-10-CM | POA: Diagnosis present

## 2014-02-25 DIAGNOSIS — Z831 Family history of other infectious and parasitic diseases: Secondary | ICD-10-CM

## 2014-02-25 DIAGNOSIS — Z841 Family history of disorders of kidney and ureter: Secondary | ICD-10-CM

## 2014-02-25 DIAGNOSIS — Z9484 Stem cells transplant status: Secondary | ICD-10-CM

## 2014-02-25 DIAGNOSIS — J189 Pneumonia, unspecified organism: Secondary | ICD-10-CM | POA: Diagnosis present

## 2014-02-25 DIAGNOSIS — Z923 Personal history of irradiation: Secondary | ICD-10-CM

## 2014-02-25 DIAGNOSIS — Z87891 Personal history of nicotine dependence: Secondary | ICD-10-CM

## 2014-02-25 DIAGNOSIS — Z791 Long term (current) use of non-steroidal anti-inflammatories (NSAID): Secondary | ICD-10-CM

## 2014-02-25 DIAGNOSIS — D72819 Decreased white blood cell count, unspecified: Secondary | ICD-10-CM | POA: Diagnosis present

## 2014-02-25 DIAGNOSIS — E46 Unspecified protein-calorie malnutrition: Secondary | ICD-10-CM | POA: Diagnosis present

## 2014-02-25 DIAGNOSIS — D6481 Anemia due to antineoplastic chemotherapy: Secondary | ICD-10-CM | POA: Diagnosis present

## 2014-02-25 DIAGNOSIS — Z8249 Family history of ischemic heart disease and other diseases of the circulatory system: Secondary | ICD-10-CM

## 2014-02-25 DIAGNOSIS — C9000 Multiple myeloma not having achieved remission: Secondary | ICD-10-CM

## 2014-02-25 DIAGNOSIS — T451X5A Adverse effect of antineoplastic and immunosuppressive drugs, initial encounter: Secondary | ICD-10-CM | POA: Diagnosis present

## 2014-02-25 DIAGNOSIS — X58XXXA Exposure to other specified factors, initial encounter: Secondary | ICD-10-CM | POA: Diagnosis present

## 2014-02-25 DIAGNOSIS — R112 Nausea with vomiting, unspecified: Secondary | ICD-10-CM

## 2014-02-25 DIAGNOSIS — Z79899 Other long term (current) drug therapy: Secondary | ICD-10-CM

## 2014-02-25 DIAGNOSIS — E669 Obesity, unspecified: Secondary | ICD-10-CM | POA: Diagnosis present

## 2014-02-25 DIAGNOSIS — Z6830 Body mass index (BMI) 30.0-30.9, adult: Secondary | ICD-10-CM

## 2014-02-25 LAB — CBC WITH DIFFERENTIAL (CANCER CENTER ONLY)
BASO#: 0 10*3/uL (ref 0.0–0.2)
BASO%: 0.5 % (ref 0.0–2.0)
EOS%: 2.3 % (ref 0.0–7.0)
Eosinophils Absolute: 0.1 10*3/uL (ref 0.0–0.5)
HEMATOCRIT: 30.4 % — AB (ref 38.7–49.9)
HEMOGLOBIN: 10.2 g/dL — AB (ref 13.0–17.1)
LYMPH#: 0.8 10*3/uL — ABNORMAL LOW (ref 0.9–3.3)
LYMPH%: 35.8 % (ref 14.0–48.0)
MCH: 31.9 pg (ref 28.0–33.4)
MCHC: 33.6 g/dL (ref 32.0–35.9)
MCV: 95 fL (ref 82–98)
MONO#: 0.4 10*3/uL (ref 0.1–0.9)
MONO%: 17.2 % — AB (ref 0.0–13.0)
NEUT%: 44.2 % (ref 40.0–80.0)
NEUTROS ABS: 1 10*3/uL — AB (ref 1.5–6.5)
Platelets: 143 10*3/uL — ABNORMAL LOW (ref 145–400)
RBC: 3.2 10*6/uL — ABNORMAL LOW (ref 4.20–5.70)
RDW: 14.5 % (ref 11.1–15.7)
WBC: 2.2 10*3/uL — ABNORMAL LOW (ref 4.0–10.0)

## 2014-02-25 LAB — CMP (CANCER CENTER ONLY)
ALK PHOS: 82 U/L (ref 26–84)
ALT(SGPT): 56 U/L — ABNORMAL HIGH (ref 10–47)
AST: 37 U/L (ref 11–38)
Albumin: 3.6 g/dL (ref 3.3–5.5)
BUN: 12 mg/dL (ref 7–22)
CO2: 26 mEq/L (ref 18–33)
Calcium: 8.6 mg/dL (ref 8.0–10.3)
Chloride: 99 mEq/L (ref 98–108)
Creat: 1.3 mg/dl — ABNORMAL HIGH (ref 0.6–1.2)
GLUCOSE: 92 mg/dL (ref 73–118)
Potassium: 4.4 mEq/L (ref 3.3–4.7)
Sodium: 140 mEq/L (ref 128–145)
Total Bilirubin: 1.1 mg/dl (ref 0.20–1.60)
Total Protein: 7.8 g/dL (ref 6.4–8.1)

## 2014-02-25 MED ORDER — ENOXAPARIN SODIUM 40 MG/0.4ML ~~LOC~~ SOLN
40.0000 mg | SUBCUTANEOUS | Status: DC
  Administered 2014-02-25: 40 mg via SUBCUTANEOUS
  Filled 2014-02-25 (×4): qty 0.4

## 2014-02-25 MED ORDER — GABAPENTIN 300 MG PO CAPS
300.0000 mg | ORAL_CAPSULE | Freq: Two times a day (BID) | ORAL | Status: DC
  Administered 2014-02-26 – 2014-02-28 (×4): 300 mg via ORAL
  Filled 2014-02-25 (×6): qty 1

## 2014-02-25 MED ORDER — LORAZEPAM 2 MG/ML IJ SOLN
0.5000 mg | Freq: Once | INTRAMUSCULAR | Status: AC
  Filled 2014-02-25 (×2): qty 1

## 2014-02-25 MED ORDER — OXYCODONE HCL 5 MG PO TABS
5.0000 mg | ORAL_TABLET | ORAL | Status: DC | PRN
Start: 2014-02-25 — End: 2014-02-28
  Administered 2014-02-25: 5 mg via ORAL
  Filled 2014-02-25: qty 1

## 2014-02-25 MED ORDER — PROCHLORPERAZINE MALEATE 10 MG PO TABS: 10.0000 mg | ORAL_TABLET | Freq: Four times a day (QID) | ORAL | Status: DC | PRN

## 2014-02-25 MED ORDER — LEVOFLOXACIN 500 MG PO TABS
500.0000 mg | ORAL_TABLET | ORAL | Status: DC
  Administered 2014-02-25: 500 mg via ORAL
  Filled 2014-02-25: qty 1

## 2014-02-25 MED ORDER — SODIUM CHLORIDE 0.9 % IV SOLN
500.0000 mg | Freq: Three times a day (TID) | INTRAVENOUS | Status: DC
  Administered 2014-02-25 – 2014-02-26 (×2): 500 mg via INTRAVENOUS
  Filled 2014-02-25 (×2): qty 500

## 2014-02-25 MED ORDER — GABAPENTIN 300 MG PO CAPS: 300.0000 mg | ORAL_CAPSULE | Freq: Three times a day (TID) | ORAL | Status: DC

## 2014-02-25 MED ORDER — IOHEXOL 350 MG/ML SOLN
100.0000 mL | Freq: Once | INTRAVENOUS | Status: AC | PRN
  Administered 2014-02-25: 100 mL via INTRAVENOUS

## 2014-02-25 MED ORDER — ACYCLOVIR 800 MG PO TABS
800.0000 mg | ORAL_TABLET | Freq: Two times a day (BID) | ORAL | Status: DC
  Administered 2014-02-25 – 2014-02-28 (×6): 800 mg via ORAL
  Filled 2014-02-25 (×7): qty 1

## 2014-02-25 MED ORDER — ACETAMINOPHEN 325 MG PO TABS
650.0000 mg | ORAL_TABLET | Freq: Four times a day (QID) | ORAL | Status: DC | PRN
Start: 2014-02-25 — End: 2014-02-25

## 2014-02-25 MED ORDER — IBUPROFEN 200 MG PO TABS: 400.0000 mg | ORAL_TABLET | Freq: Four times a day (QID) | ORAL | Status: DC | PRN

## 2014-02-25 MED ORDER — ONDANSETRON HCL 4 MG/2ML IJ SOLN
4.0000 mg | Freq: Three times a day (TID) | INTRAMUSCULAR | Status: DC | PRN
  Administered 2014-02-25: 4 mg via INTRAVENOUS
  Filled 2014-02-25: qty 2

## 2014-02-25 MED ORDER — SODIUM CHLORIDE 0.9 % IV SOLN
INTRAVENOUS | Status: DC
  Administered 2014-02-25 – 2014-02-27 (×2): via INTRAVENOUS

## 2014-02-25 MED ORDER — IOHEXOL 300 MG/ML  SOLN
25.0000 mL | INTRAMUSCULAR | Status: AC
  Administered 2014-02-25 (×2): 25 mL via ORAL

## 2014-02-25 MED ORDER — BIOTENE DRY MOUTH MT LIQD
15.0000 mL | Freq: Two times a day (BID) | OROMUCOSAL | Status: DC
  Administered 2014-02-25 – 2014-02-26 (×2): 15 mL via OROMUCOSAL

## 2014-02-25 MED ORDER — BISACODYL 5 MG PO TBEC
5.0000 mg | DELAYED_RELEASE_TABLET | Freq: Every day | ORAL | Status: DC | PRN
Start: 2014-02-25 — End: 2014-02-28

## 2014-02-25 MED ORDER — ACETAMINOPHEN 325 MG PO TABS: 650.0000 mg | ORAL_TABLET | Freq: Four times a day (QID) | ORAL | Status: DC | PRN

## 2014-02-25 MED ORDER — GABAPENTIN 300 MG PO CAPS
600.0000 mg | ORAL_CAPSULE | Freq: Every day | ORAL | Status: DC
  Administered 2014-02-25: 300 mg via ORAL
  Administered 2014-02-26 – 2014-02-27 (×2): 600 mg via ORAL
  Filled 2014-02-25 (×4): qty 2

## 2014-02-25 NOTE — H&P (Signed)
[place]  [phone]    ADMISSION NOTE  Admitting MD:  Allen Kell  Attending MD:  Allen Kell  Reason for Admission: Fever and Chills   HPI: Mark [name] is an 43 y.o. male with a history of Kappa light chain myeloma on revlimid, Velcade and Zometa as described below, admitted on 7/28 with 1 week of intermittent fever up to 102 F not improved with Aleve, chills,shortness of breath, dizziness, headache and fatigue. Patient was seen at the [place] [facility] point by Dr. Marin Olp. He denied any  nausea, vomiting, diarrhea or abdominal pain. He denies blurred vision. No chest pain or palpitations. Denies any sick contacts or rashes. No edema or worsening paresthesias.  His chest xray today shows improvement of the Left lower lobe pneumonia  but slight increase in the right, which may be suggestive of infiltrate. His WBC count today is 2.2.  Due to recent history of stem cell transplant, it was felt prudent to admit the patient for management and evaluation of symptoms.   Oncological history: Kappa light chain myeloma  CURRENT TREATMENT:  Revlimid 30m po q day  Zometa 4 mg IV q. month  Velcade q 3 week dosing    Other Past Medical History  Diagnosis Date  . History of radiation therapy 02/07/13- 02/26/13    lower L spine, upper sacrum, 35 gray in 14 fractions  Anemia of chronic disease Stage II Chronic kidney Disease History of peripheral neuropathy                                                                           09/11/13 History of sepsis secondary to flu                                                                        08/19/13   Surgeries:  Past Surgical History  Procedure Laterality Date  . Portacath placement    . Bone marrow transplant      Allergies: No Known Allergies  Medications:   Current Outpatient Prescriptions   Medication  Sig  Dispense  Refill   .  acyclovir (ZOVIRAX) 800 MG tablet  Take 800 mg by  mouth 2 (two) times daily.     .  bisacodyl (DULCOLAX) 5 MG EC tablet  Take 5 mg by mouth daily as needed for moderate constipation.     .  diazepam (VALIUM) 5 MG tablet  Take 1 tablet (5 mg total) by mouth every 6 (six) hours as needed for muscle spasms.  10 tablet  0   .  gabapentin (NEURONTIN) 300 MG capsule  Take 1-2 capsules (300-600 mg total) by mouth 3 (three) times daily. Take 3038min the morning and afternoon and take 60056mn the evening  120 capsule  3   .  ibuprofen (ADVIL,MOTRIN) 200 MG tablet  Take 400 mg by mouth every 6 (six) hours as needed.     .Marland Kitchen  lenalidomide (REVLIMID) 10 MG capsule  Take 1 capsule (10 mg total) by mouth daily.  28 capsule  0   .  levofloxacin (LEVAQUIN) 500 MG tablet  Take 1 tablet (500 mg total) by mouth daily.  9 tablet  0   .  oxyCODONE (OXY IR/ROXICODONE) 5 MG immediate release tablet  Take 5 mg by mouth every 4 (four) hours as needed for severe pain.     Marland Kitchen  prochlorperazine (COMPAZINE) 10 MG tablet  Take 1 tablet (10 mg total) by mouth every 6 (six) hours as needed for nausea or vomiting.  30 tablet  1    Scheduled Meds: . enoxaparin (LOVENOX) injection  40 mg Subcutaneous Q24H   Continuous Infusions: . sodium chloride     PRN Meds:. ZLD:JTTSVX of Systems  See HPI for significant positives.  Review of all other organs systems are otherwise negative  Family History:  Family History  Problem Relation Age of Onset  . Diabetic kidney disease Mother   . Hypertension Mother   . Heart attack Mother   . Kidney failure Mother   . Coronary artery disease Mother   . HIV Father     Social History:  reports that he quit smoking about 10 years ago. His smoking use included Cigarettes. He started smoking about 25 years ago. He has a 15 pack-year smoking history. He has never used smokeless tobacco. He reports that he does not drink alcohol or use illicit drugs.  Physical Exam:   There were no vitals filed for this visit.  ECOG FS:1 - Symptomatic but  completely ambulatory  Patient was examined by Laverna Peace, PA-C at the [facility] Ocular: Sclerae unicteric, pupils equal, round and reactive to light  Ear-nose-throat: Oropharynx clear, dentition fair  Lymphatic: No cervical or supraclavicular adenopathy  Lungs no rales or rhonchi, good excursion bilaterally  Heart regular rate and rhythm, no murmur appreciated  Abd soft, nontender, positive bowel sounds  MSK no focal spinal tenderness, no joint edema  Neuro: non-focal, well-oriented, appropriate affect   LABS: CBC   Recent Labs Lab 02/19/14 1447 02/25/14 1220  WBC 2.4* 2.2*  HGB 9.9* 10.2*  HCT 29.1* 30.4*  PLT 90* 143*  MCV 95 95  MCH 32.4 31.9  MCHC 34.0 33.6  RDW 14.3 14.5  LYMPHSABS 0.4* 0.8*  EOSABS 0.1 0.1  BASOSABS 0.0 0.0     Anemia panel:  No results found for this basename: VITAMINB12, FOLATE, FERRITIN, TIBC, IRON, RETICCTPCT,  in the last 72 hours  No results found for this basename: TSH, T4TOTAL, FREET3, T3FREE, THYROIDAB,  in the last 72 hours   No results found for this basename: esrsedrate      CMP    Recent Labs Lab 02/19/14 1447 02/25/14 1220  NA 137 140  K 3.7 4.4  CL 101 99  CO2 27 26  GLUCOSE 132* 92  BUN 22 12  CREATININE 1.5* 1.3*  CALCIUM 8.0 8.6  AST 25 37  ALT 21 56*  ALKPHOS 42 82  BILITOT 1.90* 1.10        Component Value Date/Time   BILITOT 1.10 02/25/2014 1220   BILITOT 1.3* 12/29/2013 1410      No results found for this basename: INR, PROTIME,  in the last 168 hours  No results found for this basename: DDIMER,  in the last 72 hours    Imaging Studies: Dg Chest 2 View  02/25/2014   CLINICAL DATA:  Follow-up of known phone pneumonia and fever  and congestion; history of multiple myeloma  EXAM: CHEST  2 VIEW  COMPARISON:  PA and lateral chest of February 19, 2014  FINDINGS: There has been marked interval improvement in the appearance of the infiltrate in the left upper lobe inferiorly. There remains  mildly increased density here. Elsewhere the lungs exhibit no alveolar density. There are coarse lung markings in the infrahilar region today more conspicuous than in the past. The heart is top-normal in size. The pulmonary vascularity is not engorged. The power port appliance is unchanged. The bony thorax is unremarkable.  IMPRESSION: There is been significant improvement in the appearance of the inferior left upper lobe pneumonia. However, slightly increased infrahilar lung markings on the right are present and may be reflective of subsegmental atelectasis or developing infiltrate here.   Electronically Signed   By: David  Martinique   On: 02/25/2014 12:36     A/P: 43 y.o. male with   1. Kappa Light chain Myeloma S/P Stem Cell Transplant 11/12/13 Current therapy includes  Revlimid 43m po q day, Zometa 4 mg IV q. Month, Velcade q 3 week dosing MRI of the lumbar spine with contrast to be performed, rule out lytic lesions  2. Recent Left lower Lobe pneumonia 3. Fever CXR shows improvement of the inferior lLL Pneumonia, but slight increase in the right, which may be suggestive of infiltrate.  Patient's orders for cultures and antibiotics, as well as supportive care with IVF, and antipyretic therapy  were written at the clinic by provider  during preadmission. CT  Angio of the chest and CT of the abdomen and pelvis will be performed.  Urine Culture to be drawn Flu panel will be checked  4. Leukopenia WBC 2.2 with ANC 1.1 Secondary to #2, #3 and recent chemo (C1 D1 on 6/29) He will be placed on droplet precautions.  5. Anemia of chronic disease No bleeding issues. No transfusion indicated at this time. Monitor counts   6. DVT prophylaxis Lovenox  Dr. EMarin OlpIs to see the patient following this admission note and an addendum is to be written.   [facility] 02/25/2014 3:45 PM

## 2014-02-25 NOTE — Progress Notes (Addendum)
[place]  [phone] [phone]  ID: Mark [name] OB: 1970-08-18 MR#: [id number] JYN#:[id number] Patient Care Team: No Pcp Per Patient as PCP - General (General Practice)  DIAGNOSIS: Kappa light chain myeloma  INTERVAL HISTORY: Mr. Shallenberger is here today with c/o fever, chills, SOB, dizziness, headache and fatigue. He had a temperature of 102 yesterday at home. This has persisted over a week now. He has been taking Aleve. He says the temperature goes down but then comes right back. He denies n/v, blurred vision, constipation, diarrhea, cough, rash, chest pain, palpitations, abdominal pain problems urinating or urine in urine or stool. He's had no contact with anybody who has been sick. His chest xray today shows improvement or pneumonia. He denies swelling, tenderness, numbness or tingling in his extremities. He has had no bony pain. His WBC count today is 2.2.   CURRENT TREATMENT: Revlimid 90m po q day  Zometa 4 mg IV q. month  Velcade q 3 week dosing  REVIEW OF SYSTEMS: All other 10 point review of systems is negative except for those issues mentioned above.  PAST MEDICAL HISTORY: Past Medical History  Diagnosis Date  . History of radiation therapy 02/07/13- 02/26/13    lower L spine, upper sacrum, 35 gray in 14 fractions  . Cancer   . Multiple myeloma dx'd 01/2013   PAST SURGICAL HISTORY: Past Surgical History  Procedure Laterality Date  . Portacath placement    . Bone marrow transplant     FAMILY HISTORY Family History  Problem Relation Age of Onset  . Diabetic kidney disease Mother   . Hypertension Mother   . Heart attack Mother   . Kidney failure Mother   . Coronary artery disease Mother   . HIV Father    GYNECOLOGIC HISTORY:  No LMP for male patient.   SOCIAL HISTORY:  ADVANCED DIRECTIVES:   HEALTH MAINTENANCE: History  Substance Use Topics  . Smoking status: Former Smoker -- 1.00 packs/day for 15 years    Types: Cigarettes     Start date: 06/29/1988    Quit date: 06/29/2003  . Smokeless tobacco: Never Used     Comment: quit smoking 10 years ago  . Alcohol Use: No   Colonoscopy: PAP: Bone density: Lipid panel:  No Known Allergies  Current Outpatient Prescriptions  Medication Sig Dispense Refill  . acyclovir (ZOVIRAX) 800 MG tablet Take 800 mg by mouth 2 (two) times daily.      . bisacodyl (DULCOLAX) 5 MG EC tablet Take 5 mg by mouth daily as needed for moderate constipation.      . diazepam (VALIUM) 5 MG tablet Take 1 tablet (5 mg total) by mouth every 6 (six) hours as needed for muscle spasms.  10 tablet  0  . gabapentin (NEURONTIN) 300 MG capsule Take 1-2 capsules (300-600 mg total) by mouth 3 (three) times daily. Take 3037min the morning and afternoon and take 60047mn the evening  120 capsule  3  . ibuprofen (ADVIL,MOTRIN) 200 MG tablet Take 400 mg by mouth every 6 (six) hours as needed.      . lMarland Kitchennalidomide (REVLIMID) 10 MG capsule Take 1 capsule (10 mg total) by mouth daily.  28 capsule  0  . levofloxacin (LEVAQUIN) 500 MG tablet Take 1 tablet (500 mg total) by mouth daily.  9 tablet  0  . oxyCODONE (OXY IR/ROXICODONE) 5 MG immediate release tablet Take 5 mg by mouth every 4 (four) hours as needed for severe pain.      .Marland Kitchen  prochlorperazine (COMPAZINE) 10 MG tablet Take 1 tablet (10 mg total) by mouth every 6 (six) hours as needed for nausea or vomiting.  30 tablet  1   No current facility-administered medications for this visit.   OBJECTIVE: Filed Vitals:   02/25/14 1406  BP: 120/84  Pulse: 110  Temp: 99.3 F (37.4 C)  Resp: 18   There is no weight on file to calculate BMI. ECOG FS:1 - Symptomatic but completely ambulatory Ocular: Sclerae unicteric, pupils equal, round and reactive to light Ear-nose-throat: Oropharynx clear, dentition fair Lymphatic: No cervical or supraclavicular adenopathy Lungs no rales or rhonchi, good excursion bilaterally Heart regular rate and rhythm, no murmur  appreciated Abd soft, nontender, positive bowel sounds MSK no focal spinal tenderness, no joint edema Neuro: non-focal, well-oriented, appropriate affect  LAB RESULTS: CMP     Component Value Date/Time   NA 140 02/25/2014 1220   NA 143 12/29/2013 1410   NA 145 02/21/2013 0908   K 4.4 02/25/2014 1220   K 3.8 12/29/2013 1410   K 3.8 02/21/2013 0908   CL 99 02/25/2014 1220   CL 106 12/29/2013 1410   CO2 26 02/25/2014 1220   CO2 25 12/29/2013 1410   CO2 32* 02/21/2013 0908   GLUCOSE 92 02/25/2014 1220   GLUCOSE 108* 12/29/2013 1410   GLUCOSE 115 02/21/2013 0908   BUN 12 02/25/2014 1220   BUN 13 12/29/2013 1410   BUN 19.5 02/21/2013 0908   CREATININE 1.3* 02/25/2014 1220   CREATININE 1.09 12/29/2013 1410   CREATININE 1.5* 02/21/2013 0908   CALCIUM 8.6 02/25/2014 1220   CALCIUM 8.1* 12/29/2013 1410   CALCIUM 17.7 Repeated and Verified* 02/21/2013 0908   CALCIUM >15.0* 02/01/2013 1230   PROT 7.8 02/25/2014 1220   PROT 6.6 12/29/2013 1410   ALBUMIN 3.8 12/29/2013 1410   AST 37 02/25/2014 1220   AST 18 12/29/2013 1410   ALT 56* 02/25/2014 1220   ALT 23 12/29/2013 1410   ALKPHOS 82 02/25/2014 1220   ALKPHOS 60 12/29/2013 1410   BILITOT 1.10 02/25/2014 1220   BILITOT 1.3* 12/29/2013 1410   GFRNONAA 83* 12/29/2013 1410   GFRAA >90 12/29/2013 1410   No results found for this basename: SPEP,  UPEP,   kappa and lambda light chains   Lab Results  Component Value Date   WBC 2.2* 02/25/2014   NEUTROABS 1.0* 02/25/2014   HGB 10.2* 02/25/2014   HCT 30.4* 02/25/2014   MCV 95 02/25/2014   PLT 143* 02/25/2014   No results found for this basename: LABCA2   No components found with this basename: [id number]   No results found for this basename: INR,  in the last 168 hours  STUDIES: Dg Chest 2 View  02/25/2014   CLINICAL DATA:  Follow-up of known phone pneumonia and fever and congestion; history of multiple myeloma  EXAM: CHEST  2 VIEW  COMPARISON:  PA and lateral chest of February 19, 2014  FINDINGS: There has been marked  interval improvement in the appearance of the infiltrate in the left upper lobe inferiorly. There remains mildly increased density here. Elsewhere the lungs exhibit no alveolar density. There are coarse lung markings in the infrahilar region today more conspicuous than in the past. The heart is top-normal in size. The pulmonary vascularity is not engorged. The power port appliance is unchanged. The bony thorax is unremarkable.  IMPRESSION: There is been significant improvement in the appearance of the inferior left upper lobe pneumonia. However, slightly increased infrahilar lung markings  on the right are present and may be reflective of subsegmental atelectasis or developing infiltrate here.   Electronically Signed   By: David  Martinique   On: 02/25/2014 12:36   ASSESSMENT/PLAN: Mr. Ruscitti is 43 year old done. 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 This was in January or early February.  His pneumonia is improved but his fever and other symptoms persist. WBC count 2.2.  We are going to admit him to [facility] for closer observation and testing. Orders are in and he is on his way to [facility] at this time.  We will wait and see what these tests show.  All questions were answered and he is in agreement with the aforementioned.   [name], NP 02/25/2014 2:08 PM   ADDENDUM: I agree with the above. He is going to be admitted. His white cell count going down a little bit. Still having temperatures.  I worry about the possibility of a pulmonary embolism. I didn't have to do a CT angiogram for this.  I'll get cultures on him. We'll get some CT scans on him. Will follow his temperature chart her closely.  I will dictate an admit note.

## 2014-02-26 ENCOUNTER — Inpatient Hospital Stay (HOSPITAL_COMMUNITY): Payer: BC Managed Care – PPO

## 2014-02-26 DIAGNOSIS — C9000 Multiple myeloma not having achieved remission: Principal | ICD-10-CM

## 2014-02-26 LAB — INFLUENZA PANEL BY PCR (TYPE A & B)
H1N1 flu by pcr: NOT DETECTED
Influenza A By PCR: NEGATIVE
Influenza B By PCR: NEGATIVE

## 2014-02-26 LAB — CBC
HEMATOCRIT: 25.1 % — AB (ref 39.0–52.0)
HEMOGLOBIN: 8.3 g/dL — AB (ref 13.0–17.0)
MCH: 31.1 pg (ref 26.0–34.0)
MCHC: 33.1 g/dL (ref 30.0–36.0)
MCV: 94 fL (ref 78.0–100.0)
Platelets: 127 10*3/uL — ABNORMAL LOW (ref 150–400)
RBC: 2.67 MIL/uL — AB (ref 4.22–5.81)
RDW: 15 % (ref 11.5–15.5)
WBC: 1.9 10*3/uL — AB (ref 4.0–10.5)

## 2014-02-26 LAB — COMPREHENSIVE METABOLIC PANEL
ALK PHOS: 78 U/L (ref 39–117)
ALT: 38 U/L (ref 0–53)
ANION GAP: 12 (ref 5–15)
AST: 21 U/L (ref 0–37)
Albumin: 2.8 g/dL — ABNORMAL LOW (ref 3.5–5.2)
BUN: 11 mg/dL (ref 6–23)
CHLORIDE: 105 meq/L (ref 96–112)
CO2: 22 meq/L (ref 19–32)
Calcium: 7.4 mg/dL — ABNORMAL LOW (ref 8.4–10.5)
Creatinine, Ser: 1.09 mg/dL (ref 0.50–1.35)
GFR calc Af Amer: >90 mL/min (ref >90)
GFR, EST NON AFRICAN AMERICAN: 83 mL/min — AB (ref >90)
GLUCOSE: 93 mg/dL (ref 70–99)
POTASSIUM: 3.9 meq/L (ref 3.7–5.3)
SODIUM: 139 meq/L (ref 137–147)
Total Bilirubin: 0.8 mg/dL (ref 0.3–1.2)
Total Protein: 6.1 g/dL (ref 6.0–8.3)

## 2014-02-26 LAB — URINE CULTURE
COLONY COUNT: NO GROWTH
Culture: NO GROWTH

## 2014-02-26 MED ORDER — GADOBENATE DIMEGLUMINE 529 MG/ML IV SOLN
20.0000 mL | Freq: Once | INTRAVENOUS | Status: AC | PRN
  Administered 2014-02-26: 20 mL via INTRAVENOUS

## 2014-02-26 MED ORDER — SODIUM CHLORIDE 0.9 % IV SOLN
500.0000 mg | Freq: Four times a day (QID) | INTRAVENOUS | Status: DC
  Administered 2014-02-26 – 2014-02-27 (×6): 500 mg via INTRAVENOUS
  Filled 2014-02-26 (×6): qty 500

## 2014-02-26 NOTE — Progress Notes (Signed)
Nutrition Brief Note  Patient identified on the Malnutrition Screening Tool (MST) Report  Wt Readings from Last 15 Encounters:  02/25/14 209 lb (94.802 kg)  02/19/14 210 lb (95.255 kg)  01/13/14 221 lb (100.245 kg)  12/19/13 213 lb (96.616 kg)  11/27/13 209 lb (94.802 kg)  08/21/13 209 lb 14.1 oz (95.2 kg)  07/26/13 209 lb (94.802 kg)  06/28/13 213 lb (96.616 kg)  06/25/13 210 lb (95.255 kg)  06/06/13 210 lb (95.255 kg)  06/03/13 210 lb (95.255 kg)  05/11/13 208 lb 1.8 oz (94.4 kg)  05/06/13 210 lb (95.255 kg)  04/14/13 195 lb 1.6 oz (88.497 kg)  03/28/13 201 lb 3.2 oz (91.264 kg)    Body mass index is 30.85 kg/(m^2). Patient meets criteria for Obesity I based on current BMI.   Current diet order is Regular, patient is consuming approximately 75% of meals at this time. Labs and medications reviewed.   Pt reported some nausea and decreased appetite for past one week; however weight has remained stable. Has been eating 2-3 balanced meals/day of soft foods and soups. Nausea has improved with medications, denied vomitus or abd pain post meals. Continued to encouraged balanced meals for recovery during illness  No nutrition interventions warranted at this time. If nutrition issues arise, please consult RD.   Atlee Abide MS RD LDN Clinical Dietitian [phone]

## 2014-02-26 NOTE — Addendum Note (Signed)
Addended by: Burney Gauze R on: 02/26/2014 06:42 AM   Modules accepted: Level of Service

## 2014-02-26 NOTE — Progress Notes (Signed)
Mark [name]   DOB:1971-06-02   YB#:[id number]   AJG#:[id number]  Subjective:  Scheduled Meds: . acyclovir  800 mg Oral BID  . antiseptic oral rinse  15 mL Mouth Rinse BID  . enoxaparin (LOVENOX) injection  40 mg Subcutaneous Q24H  . gabapentin  300 mg Oral BID  . gabapentin  600 mg Oral QHS  . imipenem-cilastatin  500 mg Intravenous 4 times per day  . LORazepam  0.5 mg Intravenous Once   Continuous Infusions: . sodium chloride 75 mL/hr at 02/25/14 1613   PRN Meds:.acetaminophen, bisacodyl, ibuprofen, ondansetron (ZOFRAN) IV, oxyCODONE, prochlorperazine   Objective:  Filed Vitals:   02/26/14 0607  BP: 103/57  Pulse: 88  Temp: 98.6 F (37 C)  Resp: 20      Intake/Output Summary (Last 24 hours) at 02/26/14 1115 Last data filed at 02/26/14 0500  Gross per 24 hour  Intake    960 ml  Output    100 ml  Net    860 ml    ECOG PERFORMANCE STATUS:  1 Symptomatic but completely ambulatory (Restricted in physically strenuous activity but ambulatory and able to carry out work of a light or sedentary nature. For example, light housework, office work)     GENERAL:alert, no distress and comfortable SKIN: skin color, texture, turgor are normal, no rashes or significant lesions EYES: normal, conjunctiva are pink and non-injected, sclera clear OROPHARYNX:no exudate, no erythema and lips, buccal mucosa, and tongue normal  NECK: supple, thyroid normal size, non-tender, without nodularity LYMPH:  no palpable lymphadenopathy in the cervical, axillary or inguinal LUNGS: clear to auscultation and percussion with normal breathing effort HEART: regular rate & rhythm and no murmurs and no lower extremity edema ABDOMEN:abdomen soft, non-tender and normal bowel sounds Musculoskeletal:no cyanosis of digits and no clubbing  PSYCH: alert & oriented x 3 with fluent speech NEURO: no focal motor/sensory deficits    CBG (last 3)  No results found for this basename: GLUCAP,  in the last 72  hours   Labs:   Recent Labs Lab 02/19/14 1447 02/25/14 1220 02/26/14 0715  WBC 2.4* 2.2* 1.9*  HGB 9.9* 10.2* 8.3*  HCT 29.1* 30.4* 25.1*  PLT 90* 143* 127*  MCV 95 95 94.0  MCH 32.4 31.9 31.1  MCHC 34.0 33.6 33.1  RDW 14.3 14.5 15.0  LYMPHSABS 0.4* 0.8*  --   EOSABS 0.1 0.1  --   BASOSABS 0.0 0.0  --      Chemistries:    Recent Labs Lab 02/19/14 1447 02/25/14 1220 02/26/14 0715  NA 137 140 139  K 3.7 4.4 3.9  CL 101 99 105  CO2 _0 GLUCOSE 132* 92 93  BUN _1 CREATININE 1.5* 1.3* 1.09  CALCIUM 8.0 8.6 7.4*  AST 25 37 21  ALT 21 56* 38  ALKPHOS 42 82 78  BILITOT 1.90* 1.10 0.8    GFR Estimated Creatinine Clearance: 101.3 ml/min (by C-G formula based on Cr of 1.09).  Liver Function Tests:  Recent Labs Lab 02/19/14 1447 02/25/14 1220 02/26/14 0715  AST 25 37 21  ALT 21 56* 38  ALKPHOS 42 82 78  BILITOT 1.90* 1.10 0.8  PROT 7.0 7.8 6.1  ALBUMIN  --   --  2.8*   No results found for this basename: LIPASE, AMYLASE,  in the last 168 hours No results found for this basename: AMMONIA,  in the last 168 hours  Urine Studies     Component Value Date/Time  COLORURINE YELLOW 12/29/2013 [place] 12/29/2013 1656   LABSPEC 1.014 12/29/2013 1656   PHURINE 6.0 12/29/2013 1656   GLUCOSEU NEGATIVE 12/29/2013 1656   HGBUR NEGATIVE 12/29/2013 1656   BILIRUBINUR NEGATIVE 12/29/2013 [place] 12/29/2013 1656   PROTEINUR NEGATIVE 12/29/2013 1656   UROBILINOGEN 1.0 12/29/2013 1656   NITRITE NEGATIVE 12/29/2013 1656   LEUKOCYTESUR NEGATIVE 12/29/2013 1656      Imaging Studies:  Dg Chest 2 View  02/25/2014   CLINICAL DATA:  Follow-up of known phone pneumonia and fever and congestion; history of multiple myeloma  EXAM: CHEST  2 VIEW  COMPARISON:  PA and lateral chest of February 19, 2014  FINDINGS: There has been marked interval improvement in the appearance of the infiltrate in the left upper lobe inferiorly. There remains  mildly increased density here. Elsewhere the lungs exhibit no alveolar density. There are coarse lung markings in the infrahilar region today more conspicuous than in the past. The heart is top-normal in size. The pulmonary vascularity is not engorged. The power port appliance is unchanged. The bony thorax is unremarkable.  IMPRESSION: There is been significant improvement in the appearance of the inferior left upper lobe pneumonia. However, slightly increased infrahilar lung markings on the right are present and may be reflective of subsegmental atelectasis or developing infiltrate here.   Electronically Signed   By: David  Martinique   On: 02/25/2014 12:36   Ct Angio Chest Pe W/cm &/or Wo Cm  02/25/2014   CLINICAL DATA:  Fever and cough.  Myeloma.  EXAM: CT ANGIOGRAPHY CHEST  CT ABDOMEN AND PELVIS WITH CONTRAST  TECHNIQUE: Multidetector CT imaging of the chest was performed using the standard protocol during bolus administration of intravenous contrast. Multiplanar CT image reconstructions and MIPs were obtained to evaluate the vascular anatomy. Multidetector CT imaging of the abdomen and pelvis was performed using the standard protocol during bolus administration of intravenous contrast.  CONTRAST:  128m OMNIPAQUE IOHEXOL 350 MG/ML SOLN  COMPARISON:  Chest x-ray 02/25/2014, 07/22/ 2015.  CT 07/02/2013.  FINDINGS: CTA CHEST FINDINGS  Port-A-Cath in good anatomic position.  Thoracic aorta is unremarkable. No dissection. No aneurysm. Pulmonary arteries are normal. No pulmonary embolus.  No significant mediastinal or hilar adenopathy. Thoracic esophagus is unremarkable.  Large airways are patent. Posterior left upper lung prominent pulmonary infiltrate is present. This is consistent with pneumonia. Similar finding noted on prior chest x-ray of 02/19/2014. Bibasilar atelectasis. No pleural effusion or pneumothorax.  Thyroid unremarkable. No significant axillary adenopathy. Innumerable bony lesions are again noted  throughout the axial and appendicular skeleton consistent with patient's known myeloma.  CT ABDOMEN and PELVIS FINDINGS  Punctate lucency noted in the right lobe of the liver unchanged and most likely a tiny cyst. Liver is otherwise unremarkable. Spleen is unremarkable. Pancreas unremarkable. No biliary distention. Gallbladder nondistended.  Adrenals are normal. No focal renal abnormalities identified. No hydronephrosis or obstructing ureteral stone. Bladder is nondistended. Stable mild bladder wall thickening.  No significant inguinal or retroperitoneal adenopathy. Abdominal aorta normal in caliber. No aneurysm. The visceral vessels are patent.  The appendix is prominent at 9 mm. Early changes of appendicitis cannot be excluded. Stool is noted throughout the colon. No evidence of bowel obstruction.Stomach contains debris. No free air. No mesenteric mass. No bowel herniation.  Extensive changes of myeloma noted involving the axial and appendicular skeleton. Similar findings noted on prior exam.  Review of the MIP images confirms the above findings.  IMPRESSION: 1. Persistent  posterior left upper lung pulmonary infiltrate. Similar findings noted on prior chest x-ray 02/19/2014. 2. Mild prominence of the appendix. Very early changes of appendicitis cannot be completely excluded. 3. Again noted are severe diffuse axial and appendicular bony changes of myeloma. Similar findings noted on prior study. These results were called by telephone at the time of interpretation on 02/25/2014 at 7:17 pm to nurse Grayland Ormond, who verbally acknowledged these results.   Electronically Signed   By: Marcello Moores  Register   On: 02/25/2014 19:19   Ct Abdomen Pelvis W Contrast  02/25/2014   CLINICAL DATA:  Fever and cough.  Myeloma.  EXAM: CT ANGIOGRAPHY CHEST  CT ABDOMEN AND PELVIS WITH CONTRAST  TECHNIQUE: Multidetector CT imaging of the chest was performed using the standard protocol during bolus administration of intravenous contrast.  Multiplanar CT image reconstructions and MIPs were obtained to evaluate the vascular anatomy. Multidetector CT imaging of the abdomen and pelvis was performed using the standard protocol during bolus administration of intravenous contrast.  CONTRAST:  137mL OMNIPAQUE IOHEXOL 350 MG/ML SOLN  COMPARISON:  Chest x-ray 02/25/2014, 07/22/ 2015.  CT 07/02/2013.  FINDINGS: CTA CHEST FINDINGS  Port-A-Cath in good anatomic position.  Thoracic aorta is unremarkable. No dissection. No aneurysm. Pulmonary arteries are normal. No pulmonary embolus.  No significant mediastinal or hilar adenopathy. Thoracic esophagus is unremarkable.  Large airways are patent. Posterior left upper lung prominent pulmonary infiltrate is present. This is consistent with pneumonia. Similar finding noted on prior chest x-ray of 02/19/2014. Bibasilar atelectasis. No pleural effusion or pneumothorax.  Thyroid unremarkable. No significant axillary adenopathy. Innumerable bony lesions are again noted throughout the axial and appendicular skeleton consistent with patient's known myeloma.  CT ABDOMEN and PELVIS FINDINGS  Punctate lucency noted in the right lobe of the liver unchanged and most likely a tiny cyst. Liver is otherwise unremarkable. Spleen is unremarkable. Pancreas unremarkable. No biliary distention. Gallbladder nondistended.  Adrenals are normal. No focal renal abnormalities identified. No hydronephrosis or obstructing ureteral stone. Bladder is nondistended. Stable mild bladder wall thickening.  No significant inguinal or retroperitoneal adenopathy. Abdominal aorta normal in caliber. No aneurysm. The visceral vessels are patent.  The appendix is prominent at 9 mm. Early changes of appendicitis cannot be excluded. Stool is noted throughout the colon. No evidence of bowel obstruction.Stomach contains debris. No free air. No mesenteric mass. No bowel herniation.  Extensive changes of myeloma noted involving the axial and appendicular skeleton.  Similar findings noted on prior exam.  Review of the MIP images confirms the above findings.  IMPRESSION: 1. Persistent posterior left upper lung pulmonary infiltrate. Similar findings noted on prior chest x-ray 02/19/2014. 2. Mild prominence of the appendix. Very early changes of appendicitis cannot be completely excluded. 3. Again noted are severe diffuse axial and appendicular bony changes of myeloma. Similar findings noted on prior study. These results were called by telephone at the time of interpretation on 02/25/2014 at 7:17 pm to nurse Grayland Ormond, who verbally acknowledged these results.   Electronically Signed   By: Marcello Moores  Register   On: 02/25/2014 19:19    Assessment/Plan: 43 y.o.  1. 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 Month, Velcade q 3 week dosing  MRI of the lumbar spine with contrast to be performed, rule out lytic lesions   2. Recent Left lower Lobe pneumonia  3. Fever  CXR shows improvement of the inferior LLL Pneumonia,  but slight increase in the right, which may be suggestive of infiltrate.  CT Angio of the chest and CT of the abdomen and pelvis on 7/28 showed persistent posterior left upper lung pulmonary infiltrate,Mild prominence of the appendix. Very early changes of appendicitis cannot be completely excluded. Severe diffuse axial and appendicular bony changes of myeloma, but similar to prior study. Cultures pending Continue Imipenem day 2, as well as supportive care with IVF Fever has resolved as of 7/29  4. Leukopenia  WBC today lower, at 1.9. Check differential.   Secondary to #2, #3 and recent chemo (C1 D1 on 6/29)  Continue droplet precautions. Broad spectrum coverage with Imipenem day 2. Continue Acyclovir therapy   5. Anemia of chronic disease  Due to recent chemo, ongoing infection, IVF Continue Imipenem day 2 No bleeding issues. No transfusion indicated at this time.  Monitor counts   6. DVT prophylaxis   on Lovenox  Monitor platelet counts, as a drop in value is seen, today at 127.  No bleeding issues. No transfusion is indicated at this time  7. Full Code       **Disclaimer: This note was dictated with voice recognition software. Similar sounding words can inadvertently be transcribed and this note may contain transcription errors which may not have been corrected upon publication of note.** WERTMAN,SARA E, PA-C 02/26/2014  11:15 AM  ADDENDUM:  He is doing a little bit better. His white cell count continues to drop. I'm not sure if this is some probable viral syndrome.  We need to get a bone marrow biopsy on him. I will see if radiology can do this in the next day or so. I think this is important so we can figure out why his blood counts are getting worse. So far, has not had a temperature spike since admission. His cultures were negative. His influenza titers are negative.  I saw the MRI report. It's possible that this numbness in the right leg when he lies down could be from the abnormality at L5-S1. Am not sure what we can really do about this. I'm not sure if radiation therapy could help.  He has this T12 fracture. He's asymptomatic with this. However, I wonder if kyphoplasty would be appropriate.  His appetite is a little bit better.  He has had no problems bleeding. He's had no diarrhea. He's had no cough or shortness of breath.  A CT angiogram did not show any pulmonary emboli.  I still worry about his myeloma recurrent. He had a very aggressive myeloma when he presented. Had multiple chromosomal abnormalities. I would think that he would be at significant risk for recurrence.  I will cut back his IV fluids a little bit.  I don't see we have to transfuse him. We will see what his blood counts are tomorrow.  [name], I can't find anything specific. His lungs sound clear. His cardiac exam is regular in rhythm. There is no  murmurs. Abdomen is soft. There is no palpable liver or spleen tip. Extremities shows good strength.  I do want to get a 24 urine on him. I think this will also help was with his myeloma evaluation.

## 2014-02-26 NOTE — Care Management Note (Signed)
    Page 1 of 1   02/28/2014     11:59:18 AM CARE MANAGEMENT NOTE 02/28/2014  Patient:  [facility]   Account Number:  [id number]  Date Initiated:  02/26/2014  Documentation initiated by:  [facility]  Subjective/Objective Assessment:   adm: Fever & Chills; Multiple myeloma     Action/Plan:   Anticipated DC Date:  02/28/2014   Anticipated DC Plan:  [place]  CM consult      Choice offered to / List presented to:             Status of service:  Completed, signed off Medicare Important Message given?   (If response is "NO", the following Medicare IM given date fields will be blank) Date Medicare IM given:   Medicare IM given by:   Date Additional Medicare IM given:   Additional Medicare IM given by:    Discharge Disposition:  HOME/SELF CARE  Per UR Regulation:  Reviewed for med. necessity/level of care/duration of stay  If discussed at [place] of Stay Meetings, dates discussed:    Comments:  02/28/14 Kavir Savoca RN,BSN NCM [phone] NO D/C [place].  02/26/14 11:20 Pt ind w/ADLs; lives with wife at home: [place] B[place] [phone].  Will follow for disposition needs.  Mariane Masters, BSN, CM [phone].

## 2014-02-27 ENCOUNTER — Ambulatory Visit (HOSPITAL_COMMUNITY): Payer: BC Managed Care – PPO

## 2014-02-27 ENCOUNTER — Encounter (HOSPITAL_COMMUNITY): Payer: Self-pay | Admitting: Radiology

## 2014-02-27 LAB — CBC WITH DIFFERENTIAL/PLATELET
BASOS ABS: 0 10*3/uL (ref 0.0–0.1)
Basophils Relative: 0 % (ref 0–1)
EOS PCT: 2 % (ref 0–5)
Eosinophils Absolute: 0 10*3/uL (ref 0.0–0.7)
HCT: 28.5 % — ABNORMAL LOW (ref 39.0–52.0)
Hemoglobin: 9.8 g/dL — ABNORMAL LOW (ref 13.0–17.0)
LYMPHS PCT: 26 % (ref 12–46)
Lymphs Abs: 0.7 10*3/uL (ref 0.7–4.0)
MCH: 31.6 pg (ref 26.0–34.0)
MCHC: 34.4 g/dL (ref 30.0–36.0)
MCV: 91.9 fL (ref 78.0–100.0)
MONO ABS: 0.4 10*3/uL (ref 0.1–1.0)
MONOS PCT: 16 % — AB (ref 3–12)
Neutro Abs: 1.5 10*3/uL — ABNORMAL LOW (ref 1.7–7.7)
Neutrophils Relative %: 56 % (ref 43–77)
Platelets: 175 10*3/uL (ref 150–400)
RBC: 3.1 MIL/uL — ABNORMAL LOW (ref 4.22–5.81)
RDW: 14.6 % (ref 11.5–15.5)
WBC: 2.7 10*3/uL — ABNORMAL LOW (ref 4.0–10.5)

## 2014-02-27 LAB — COMPREHENSIVE METABOLIC PANEL
ALT: 39 U/L (ref 0–53)
ANION GAP: 12 (ref 5–15)
AST: 24 U/L (ref 0–37)
Albumin: 3.6 g/dL (ref 3.5–5.2)
Alkaline Phosphatase: 90 U/L (ref 39–117)
BUN: 13 mg/dL (ref 6–23)
CALCIUM: 9.1 mg/dL (ref 8.4–10.5)
CO2: 23 meq/L (ref 19–32)
CREATININE: 1.18 mg/dL (ref 0.50–1.35)
Chloride: 101 mEq/L (ref 96–112)
GFR, EST AFRICAN AMERICAN: 87 mL/min — AB (ref >90)
GFR, EST NON AFRICAN AMERICAN: 75 mL/min — AB (ref >90)
Glucose, Bld: 106 mg/dL — ABNORMAL HIGH (ref 70–99)
Potassium: 4.8 mEq/L (ref 3.7–5.3)
Sodium: 136 mEq/L — ABNORMAL LOW (ref 137–147)
Total Bilirubin: 0.9 mg/dL (ref 0.3–1.2)
Total Protein: 7.3 g/dL (ref 6.0–8.3)

## 2014-02-27 LAB — BONE MARROW EXAM

## 2014-02-27 MED ORDER — FENTANYL CITRATE 0.05 MG/ML IJ SOLN
INTRAMUSCULAR | Status: AC | PRN
  Administered 2014-02-27 (×2): 50 ug via INTRAVENOUS

## 2014-02-27 MED ORDER — MIDAZOLAM HCL 2 MG/2ML IJ SOLN
INTRAMUSCULAR | Status: AC | PRN
  Administered 2014-02-27 (×2): 1 mg via INTRAVENOUS
  Administered 2014-02-27: 2 mg via INTRAVENOUS

## 2014-02-27 MED ORDER — FENTANYL CITRATE 0.05 MG/ML IJ SOLN
INTRAMUSCULAR | Status: AC
  Filled 2014-02-27: qty 6

## 2014-02-27 MED ORDER — MIDAZOLAM HCL 2 MG/2ML IJ SOLN
INTRAMUSCULAR | Status: AC
  Filled 2014-02-27: qty 6

## 2014-02-27 NOTE — Progress Notes (Signed)
Patient ID: Mark [name], male   DOB: May 20, 1971, 43 y.o.   MRN: [id number] Request noted for  T12 VP/KP on pt. On exam today pt is not tender at T12 region with deep palpation. Prefer to do these type of procedures when pt is moderately symptomatic at site of fx. Will plan to review case with Dr. Vernard Gambles. If pt develops sig pain at site and is candidate for VP/KP can potentially perform case on 8/4 at [name]. Will monitor.

## 2014-02-27 NOTE — Progress Notes (Signed)
Patient ID: Mark [name], male   DOB: [birth year], 43 y.o.   MRN: [id number] Request received for CT guided bone marrow biopsy in pt with history of kappa light chain myeloma,  leukopenia, anemia of chronic disease. Pt also with recently diagnosed LLL PNA, T12 fx . Additional PMH as below. Exam: pt awake/alert; chest- CTA bilat;intact rt chest wall PAC; heart- RRR; abd- soft,+BS,NT; ext- FROM, no edema.  Pt denies sig pain in mid back region at this time. Filed Vitals:   02/26/14 2147 02/27/14 0159 02/27/14 0500 02/27/14 0541  BP: 130/65 117/77  125/70  Pulse: 107 90  100  Temp: 100.3 F (37.9 C) 99.1 F (37.3 C)  99.7 F (37.6 C)  TempSrc: Oral   Oral  Resp: _0 Height:      Weight:   208 lb 8 oz (94.575 kg)   SpO2: 92% 98%  94%   Past Medical History  Diagnosis Date  . History of radiation therapy 02/07/13- 02/26/13    lower L spine, upper sacrum, 35 gray in 14 fractions  . Cancer   . Multiple myeloma dx'd 01/2013   Past Surgical History  Procedure Laterality Date  . Portacath placement    . Bone marrow transplant     Dg Chest 2 View  02/25/2014   CLINICAL DATA:  Follow-up of known phone pneumonia and fever and congestion; history of multiple myeloma  EXAM: CHEST  2 VIEW  COMPARISON:  PA and lateral chest of February 19, 2014  FINDINGS: There has been marked interval improvement in the appearance of the infiltrate in the left upper lobe inferiorly. There remains mildly increased density here. Elsewhere the lungs exhibit no alveolar density. There are coarse lung markings in the infrahilar region today more conspicuous than in the past. The heart is top-normal in size. The pulmonary vascularity is not engorged. The power port appliance is unchanged. The bony thorax is unremarkable.  IMPRESSION: There is been significant improvement in the appearance of the inferior left upper lobe pneumonia. However, slightly increased infrahilar lung markings on the right are present and may be reflective  of subsegmental atelectasis or developing infiltrate here.   Electronically Signed   By: David  Martinique   On: 02/25/2014 12:36   Dg Chest 2 View  02/19/2014   CLINICAL DATA:  Fever, history of myeloma and bone marrow transplant 6 months ago  EXAM: CHEST  2 VIEW  COMPARISON:  Chest x-ray of 12/29/2013  FINDINGS: There is a parenchymal opacity medially probably within the superior segment of the left lower lobe most consistent with pneumonia. A followup chest x-ray is recommended to ensure complete clearing. No pleural effusion is seen. The right lung is clear. Right-sided Port-A-Cath tip overlies the expected SVC -RA junction and the heart remains mildly enlarged and stable. No acute bony abnormality seen with a partial compression deformity noted in the lower thoracic spine appearing stable.  IMPRESSION: New parenchymal opacity medially probably within the superior segment of the left lower lobe most consistent with pneumonia. Recommend followup to ensure clearing.   Electronically Signed   By: Ivar Drape M.D.   On: 02/19/2014 15:56   Ct Angio Chest Pe W/cm &/or Wo Cm  02/25/2014   CLINICAL DATA:  Fever and cough.  Myeloma.  EXAM: CT ANGIOGRAPHY CHEST  CT ABDOMEN AND PELVIS WITH CONTRAST  TECHNIQUE: Multidetector CT imaging of the chest was performed using the standard protocol during bolus administration of intravenous contrast. Multiplanar CT image reconstructions  and MIPs were obtained to evaluate the vascular anatomy. Multidetector CT imaging of the abdomen and pelvis was performed using the standard protocol during bolus administration of intravenous contrast.  CONTRAST:  110m OMNIPAQUE IOHEXOL 350 MG/ML SOLN  COMPARISON:  Chest x-ray 02/25/2014, 07/22/ 2015.  CT 07/02/2013.  FINDINGS: CTA CHEST FINDINGS  Port-A-Cath in good anatomic position.  Thoracic aorta is unremarkable. No dissection. No aneurysm. Pulmonary arteries are normal. No pulmonary embolus.  No significant mediastinal or hilar adenopathy.  Thoracic esophagus is unremarkable.  Large airways are patent. Posterior left upper lung prominent pulmonary infiltrate is present. This is consistent with pneumonia. Similar finding noted on prior chest x-ray of 02/19/2014. Bibasilar atelectasis. No pleural effusion or pneumothorax.  Thyroid unremarkable. No significant axillary adenopathy. Innumerable bony lesions are again noted throughout the axial and appendicular skeleton consistent with patient's known myeloma.  CT ABDOMEN and PELVIS FINDINGS  Punctate lucency noted in the right lobe of the liver unchanged and most likely a tiny cyst. Liver is otherwise unremarkable. Spleen is unremarkable. Pancreas unremarkable. No biliary distention. Gallbladder nondistended.  Adrenals are normal. No focal renal abnormalities identified. No hydronephrosis or obstructing ureteral stone. Bladder is nondistended. Stable mild bladder wall thickening.  No significant inguinal or retroperitoneal adenopathy. Abdominal aorta normal in caliber. No aneurysm. The visceral vessels are patent.  The appendix is prominent at 9 mm. Early changes of appendicitis cannot be excluded. Stool is noted throughout the colon. No evidence of bowel obstruction.Stomach contains debris. No free air. No mesenteric mass. No bowel herniation.  Extensive changes of myeloma noted involving the axial and appendicular skeleton. Similar findings noted on prior exam.  Review of the MIP images confirms the above findings.  IMPRESSION: 1. Persistent posterior left upper lung pulmonary infiltrate. Similar findings noted on prior chest x-ray 02/19/2014. 2. Mild prominence of the appendix. Very early changes of appendicitis cannot be completely excluded. 3. Again noted are severe diffuse axial and appendicular bony changes of myeloma. Similar findings noted on prior study. These results were called by telephone at the time of interpretation on 02/25/2014 at 7:17 pm to nurse KGrayland Ormond who verbally acknowledged these  results.   Electronically Signed   By: TMarcello Moores Register   On: 02/25/2014 19:19   Mr Lumbar Spine W Wo Contrast  02/26/2014   CLINICAL DATA:  Patient with history of multiple myeloma and RIGHT lower extremity paresthesias. RIGHT lower extremity radiculopathy. Spinal involvement with multiple myeloma.  EXAM: MRI LUMBAR SPINE WITHOUT AND WITH CONTRAST  TECHNIQUE: Multiplanar and multiecho pulse sequences of the lumbar spine were obtained without and with intravenous contrast.  CONTRAST:  240mMULTIHANCE GADOBENATE DIMEGLUMINE 529 MG/ML IV SOLN  COMPARISON:  CT 02/25/2014.  MRI 02/01/2013.  FINDINGS: The study is moderately degraded by motion artifact which affects both sagittal and axial images. The patient was unable the comply with scanning due to pain in the supine position. Technical parameters or modified to accommodate patient discomfort.  Segmentation: Numbering used on prior exam is preserved. 5 lumbar type vertebral bodies.  Alignment:  Mild dextroconvex curve with the apex at L4.  Vertebrae: Infiltration and pathologic compression fracture of the T12 vertebra with progressive collapse associated with treatment related affects when compared to 05/09/2013 CT. Maximal loss of vertebral body height is about 75% but there is no retropulsion. Residual enhancing tumor is present in the T12 vertebra.  Small enhancing Schmorl's nodes are present on both sides of L1-L 2. 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  The plasmacytoma previously demonstrated on MRI has markedly decreased in size and vascularity with areas of T2 hyperintense necrotic/cystic tumor in the region of previously demonstrated mass. Vertebral body height at L5 is preserved.  Conus medullaris: Normal at L1-L2. No evidence of intramedullary or intrathecal disease. No central canal epidural tumor or cord compression.  Paraspinal tissues: Poorly evaluated  due to motion. Evaluation is deferred to recent CT.  Disc levels:  T11-T12: Sagittal imaging. Minimal disc bulging. Minimal central stenosis.  T12-L1: Shallow disc bulging with very mild central stenosis. No cord deformity.  L1-L2:  Disc desiccation and shallow disc bulging without stenosis.  L2-L3:  Negative.  L3-L4: Mild posterior ligamentum flavum redundancy and facet arthrosis producing very mild central stenosis. Foramina patent.  L4-L5: Mild central stenosis appears unchanged allowing for motion degradation at this level. Shallow broad-based disc bulging and short pedicles are present. Neural foramina grossly appear adequately patent. There is paraspinal edema around the posterior elements which is most compatible with treatment related changes rather than facet arthritis.  L5-S1: Central canal and lateral recesses remain patent. Disc desiccation is present. The RIGHT-sided pedicle and transverse process plasmacytoma is markedly improved. This is best evaluated on axial T1 weighted images. The the tumor now measures 5 cm AP x 4.5 cm transverse, previously 6.2 cm by 6.4 cm. Extraosseous portion of the tumor has resolved. There is still peripheral enhancement of the interosseous tumor, with central necrotic nonenhancing tissue. Persistent RIGHT foraminal stenosis associated with tumor and remodeling of the vertebra potentially affecting the RIGHT L5 nerve.  Myeloma in both iliac bones is incidentally noted, better seen on prior CT.  IMPRESSION: 1. 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 Substantial tumor mass reduction, now measuring 5 cm x 4.5 cm, previously 6.2 x 6.4 cm. Persistent RIGHT L5-S1 foraminal stenosis potentially affecting the RIGHT L5 nerve. Paraspinal edema and enhancement is likely secondary to radiotherapy rather than degenerative changes. 2. T12 pathologic compression fracture shows progressive collapse with some residual tumor in the fracture cleavage plane  compared to prior CT from 2014. 3. Moderately technically degraded study due to motion artifact. 4. No new infiltrating lesions in the lumbar spine.   Electronically Signed   By: Dereck Ligas M.D.   On: 02/26/2014 11:52   Ct Abdomen Pelvis W Contrast  02/25/2014   CLINICAL DATA:  Fever and cough.  Myeloma.  EXAM: CT ANGIOGRAPHY CHEST  CT ABDOMEN AND PELVIS WITH CONTRAST  TECHNIQUE: Multidetector CT imaging of the chest was performed using the standard protocol during bolus administration of intravenous contrast. Multiplanar CT image reconstructions and MIPs were obtained to evaluate the vascular anatomy. Multidetector CT imaging of the abdomen and pelvis was performed using the standard protocol during bolus administration of intravenous contrast.  CONTRAST:  159m OMNIPAQUE IOHEXOL 350 MG/ML SOLN  COMPARISON:  Chest x-ray 02/25/2014, 07/22/ 2015.  CT 07/02/2013.  FINDINGS: CTA CHEST FINDINGS  Port-A-Cath in good anatomic position.  Thoracic aorta is unremarkable. No dissection. No aneurysm. Pulmonary arteries are normal. No pulmonary embolus.  No significant mediastinal or hilar adenopathy. Thoracic esophagus is unremarkable.  Large airways are patent. Posterior left upper lung prominent pulmonary infiltrate is present. This is consistent with pneumonia. Similar finding noted on prior chest x-ray of 02/19/2014. Bibasilar atelectasis. No pleural effusion or pneumothorax.  Thyroid unremarkable. No significant axillary adenopathy. Innumerable bony lesions are again noted throughout the axial and appendicular skeleton consistent  with patient's known myeloma.  CT ABDOMEN and PELVIS FINDINGS  Punctate lucency noted in the right lobe of the liver unchanged and most likely a tiny cyst. Liver is otherwise unremarkable. Spleen is unremarkable. Pancreas unremarkable. No biliary distention. Gallbladder nondistended.  Adrenals are normal. No focal renal abnormalities identified. No hydronephrosis or obstructing ureteral  stone. Bladder is nondistended. Stable mild bladder wall thickening.  No significant inguinal or retroperitoneal adenopathy. Abdominal aorta normal in caliber. No aneurysm. The visceral vessels are patent.  The appendix is prominent at 9 mm. Early changes of appendicitis cannot be excluded. Stool is noted throughout the colon. No evidence of bowel obstruction.Stomach contains debris. No free air. No mesenteric mass. No bowel herniation.  Extensive changes of myeloma noted involving the axial and appendicular skeleton. Similar findings noted on prior exam.  Review of the MIP images confirms the above findings.  IMPRESSION: 1. Persistent posterior left upper lung pulmonary infiltrate. Similar findings noted on prior chest x-ray 02/19/2014. 2. Mild prominence of the appendix. Very early changes of appendicitis cannot be completely excluded. 3. Again noted are severe diffuse axial and appendicular bony changes of myeloma. Similar findings noted on prior study. These results were called by telephone at the time of interpretation on 02/25/2014 at 7:17 pm to nurse Grayland Ormond, who verbally acknowledged these results.   Electronically Signed   By: Marcello Moores  Register   On: 02/25/2014 19:19  Results for orders placed during the hospital encounter of 02/25/14  URINE CULTURE      Result Value Ref Range   Specimen Description URINE, CLEAN CATCH     Special Requests Immunocompromised     Culture  Setup Time       Value: 02/25/2014 22:35     Performed at [facility] Count       Value: NO GROWTH     Performed at [facility]   Culture       Value: NO GROWTH     Performed at [facility]   Report Status 02/26/2014 FINAL    INFLUENZA PANEL BY PCR (TYPE A & B, H1N1)      Result Value Ref Range   Influenza A By PCR NEGATIVE  NEGATIVE   Influenza B By PCR NEGATIVE  NEGATIVE   H1N1 flu by pcr NOT DETECTED  NOT DETECTED  CBC      Result Value Ref Range   WBC 1.9 (*) 4.0 - 10.5 K/uL   RBC  2.67 (*) 4.22 - 5.81 MIL/uL   Hemoglobin 8.3 (*) 13.0 - 17.0 g/dL   HCT 25.1 (*) 39.0 - 52.0 %   MCV 94.0  78.0 - 100.0 fL   MCH 31.1  26.0 - 34.0 pg   MCHC 33.1  30.0 - 36.0 g/dL   RDW 15.0  11.5 - 15.5 %   Platelets 127 (*) 150 - 400 K/uL  COMPREHENSIVE METABOLIC PANEL      Result Value Ref Range   Sodium 139  137 - 147 mEq/L   Potassium 3.9  3.7 - 5.3 mEq/L   Chloride 105  96 - 112 mEq/L   CO2 22  19 - 32 mEq/L   Glucose, Bld 93  70 - 99 mg/dL   BUN 11  6 - 23 mg/dL   Creatinine, Ser 1.09  0.50 - 1.35 mg/dL   Calcium 7.4 (*) 8.4 - 10.5 mg/dL   Total Protein 6.1  6.0 - 8.3 g/dL   Albumin 2.8 (*) 3.5 -  5.2 g/dL   AST 21  0 - 37 U/L   ALT 38  0 - 53 U/L   Alkaline Phosphatase 78  39 - 117 U/L   Total Bilirubin 0.8  0.3 - 1.2 mg/dL   GFR calc non Af Amer 83 (*) >90 mL/min   GFR calc Af Amer >90  >90 mL/min   Anion gap 12  5 - 15  CBC WITH DIFFERENTIAL      Result Value Ref Range   WBC 2.7 (*) 4.0 - 10.5 K/uL   RBC 3.10 (*) 4.22 - 5.81 MIL/uL   Hemoglobin 9.8 (*) 13.0 - 17.0 g/dL   HCT 28.5 (*) 39.0 - 52.0 %   MCV 91.9  78.0 - 100.0 fL   MCH 31.6  26.0 - 34.0 pg   MCHC 34.4  30.0 - 36.0 g/dL   RDW 14.6  11.5 - 15.5 %   Platelets 175  150 - 400 K/uL   Neutrophils Relative % 56  43 - 77 %   Neutro Abs 1.5 (*) 1.7 - 7.7 K/uL   Lymphocytes Relative 26  12 - 46 %   Lymphs Abs 0.7  0.7 - 4.0 K/uL   Monocytes Relative 16 (*) 3 - 12 %   Monocytes Absolute 0.4  0.1 - 1.0 K/uL   Eosinophils Relative 2  0 - 5 %   Eosinophils Absolute 0.0  0.0 - 0.7 K/uL   Basophils Relative 0  0 - 1 %   Basophils Absolute 0.0  0.0 - 0.1 K/uL  COMPREHENSIVE METABOLIC PANEL      Result Value Ref Range   Sodium 136 (*) 137 - 147 mEq/L   Potassium 4.8  3.7 - 5.3 mEq/L   Chloride 101  96 - 112 mEq/L   CO2 23  19 - 32 mEq/L   Glucose, Bld 106 (*) 70 - 99 mg/dL   BUN 13  6 - 23 mg/dL   Creatinine, Ser 1.18  0.50 - 1.35 mg/dL   Calcium 9.1  8.4 - 10.5 mg/dL   Total Protein 7.3  6.0 - 8.3 g/dL    Albumin 3.6  3.5 - 5.2 g/dL   AST 24  0 - 37 U/L   ALT 39  0 - 53 U/L   Alkaline Phosphatase 90  39 - 117 U/L   Total Bilirubin 0.9  0.3 - 1.2 mg/dL   GFR calc non Af Amer 75 (*) >90 mL/min   GFR calc Af Amer 87 (*) >90 mL/min   Anion gap 12  5 - 15   A/P:  Pt with history of kappa light chain myeloma,  leukopenia, anemia of chronic disease. Pt also with recently diagnosed LLL PNA. Plan is for CT guided bone marrow biopsy today. Details/risks of procedure d/w pt with his understanding and consent.

## 2014-02-27 NOTE — Procedures (Signed)
Technically successful CT guided bone marrow aspiration and biopsy of left iliac crest. No immediate complications.   

## 2014-02-27 NOTE — Progress Notes (Signed)
Mark [name] to is doing better today. He still has not had any temperature. He is going for is a bone marrow test today. He is doing his 24-hour urine.  Cultures are all negative.  He did have the MRI done. It showed that there was a compression fracture at T12. He is not symptomatic in this area. I still want to see if there is an indication for kyphoplasty.  He does have some stenosis that the right L5-S1 root. This might be causing his symptoms. Again, I don't think there is anything that we need to do a for this right now.  He has had a better appetite. There is no nausea vomiting.  His blood counts are improving. White cell count is now 2.7. Hemoglobin 9.8. Platelet count 175.  I just wonder if he did not have some type of viral syndrome. I will go ahead and stop the antibiotics right now. Again, his cultures are negative.  He is out of bed. He's not had any diarrhea. She's not had any cough. There is no headache. He's had no rashes.  His vital signs are all stable. His temperature is 98.8. Blood pressure 130/81. Pulse is 92. Lungs are clear. Oral exam shows no mucositis. Cardiac exam regular rate rhythm. Abdomen soft. Good bowel sounds. There is no fluid wave. There is no palpable liver spleen tip. Extremities shows no clubbing cyanosis or edema.  Possibly, we might be able to let him go home tomorrow. We will see what his temperature does today off antibiotics. I want to make sure that he finishes his 24 hour urine. We will check his blood counts.

## 2014-02-28 ENCOUNTER — Ambulatory Visit: Payer: Self-pay

## 2014-02-28 DIAGNOSIS — E46 Unspecified protein-calorie malnutrition: Secondary | ICD-10-CM

## 2014-02-28 DIAGNOSIS — D72819 Decreased white blood cell count, unspecified: Secondary | ICD-10-CM

## 2014-02-28 DIAGNOSIS — J189 Pneumonia, unspecified organism: Secondary | ICD-10-CM

## 2014-02-28 DIAGNOSIS — D638 Anemia in other chronic diseases classified elsewhere: Secondary | ICD-10-CM

## 2014-02-28 DIAGNOSIS — R5081 Fever presenting with conditions classified elsewhere: Secondary | ICD-10-CM

## 2014-02-28 LAB — COMPREHENSIVE METABOLIC PANEL
ALBUMIN: 3.3 g/dL — AB (ref 3.5–5.2)
ALK PHOS: 81 U/L (ref 39–117)
ALT: 30 U/L (ref 0–53)
AST: 21 U/L (ref 0–37)
Anion gap: 12 (ref 5–15)
BILIRUBIN TOTAL: 0.6 mg/dL (ref 0.3–1.2)
BUN: 14 mg/dL (ref 6–23)
CO2: 23 mEq/L (ref 19–32)
Calcium: 9.3 mg/dL (ref 8.4–10.5)
Chloride: 102 mEq/L (ref 96–112)
Creatinine, Ser: 1.12 mg/dL (ref 0.50–1.35)
GFR calc Af Amer: >90 mL/min (ref >90)
GFR calc non Af Amer: 80 mL/min — ABNORMAL LOW (ref >90)
Glucose, Bld: 107 mg/dL — ABNORMAL HIGH (ref 70–99)
POTASSIUM: 4.7 meq/L (ref 3.7–5.3)
Sodium: 137 mEq/L (ref 137–147)
Total Protein: 6.8 g/dL (ref 6.0–8.3)

## 2014-02-28 LAB — CBC
HEMATOCRIT: 27.9 % — AB (ref 39.0–52.0)
Hemoglobin: 9.2 g/dL — ABNORMAL LOW (ref 13.0–17.0)
MCH: 30.9 pg (ref 26.0–34.0)
MCHC: 33 g/dL (ref 30.0–36.0)
MCV: 93.6 fL (ref 78.0–100.0)
Platelets: 171 10*3/uL (ref 150–400)
RBC: 2.98 MIL/uL — ABNORMAL LOW (ref 4.22–5.81)
RDW: 14.7 % (ref 11.5–15.5)
WBC: 2.4 10*3/uL — AB (ref 4.0–10.5)

## 2014-02-28 MED ORDER — ASPIRIN EC 325 MG PO TBEC: 325.0000 mg | DELAYED_RELEASE_TABLET | Freq: Every day | ORAL | Status: DC

## 2014-02-28 MED ORDER — HEPARIN SOD (PORK) LOCK FLUSH 100 UNIT/ML IV SOLN
500.0000 [IU] | INTRAVENOUS | Status: AC | PRN
  Administered 2014-02-28: 500 [IU]
  Filled 2014-02-28: qty 5

## 2014-02-28 NOTE — Discharge Summary (Signed)
Physician Discharge Summary  Mark [name] JQZ:[id number] DOB: 06/08/1971 DOA: 02/25/2014  PCP: No PCP Per Patient  Admit date: 02/25/2014 Discharge date: 02/28/2014   Recommendations for Outpatient Follow-Up:  March 05, 2014 with labs. See AVS for details Prescription for ASA 325 mg daily was written and sent to his pharmacy.  Discharge Diagnosis:    Past Medical History  Diagnosis Date  . History of radiation therapy 02/07/13- 02/26/13    lower L spine, upper sacrum, 35 gray in 14 fractions  . Cancer   . Multiple myeloma dx'd 01/2013       Pain, controlled      Fever, resolved      Leukopenia      Anemia      Malnutrition  Discharge Condition: Improved.  Diet recommendation:   Regular.   History of Present Illness:   HPI: Mark [name] is an 43 y.o. male with a history of Kappa light chain myeloma on revlimid, Velcade and Zometa as described below, admitted on 7/28 with 1 week of intermittent fever up to 102 F not improved with Aleve, chills,shortness of breath, dizziness, headache and fatigue. Patient was seen at the [place] [facility] point by Dr. Marin Olp. He denied any nausea, vomiting, diarrhea or abdominal pain. He denies blurred vision. No chest pain or palpitations. Denies any sick contacts or rashes. No edema or worsening paresthesias. His chest xray on admission showed improvement of the Left lower lobe pneumonia but slight increase in the right, which may be suggestive of infiltrate. His WBC count was 2.2.  Due to recent history of stem cell transplant, it was felt prudent to admit the patient for management and evaluation of symptoms.     Hospital Course by Problem:   1. 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 Month, Velcade q 3 week dosing  MRI of the lumbar spine with contrast was performed to rule out lytic lesion. This showed  that there was a compression fracture at T12. He  is not symptomatic in this area. IR was consulted to see if there is an indication for kyphoplasty. Of note, stenosis that the right L5-S1 root was seen as well which might be causing his pain.   On exam this was non-tender at T12 region with deep palpation. IR prefer to do these type of procedures when patient is moderately symptomatic at site of fracture. According to chart note, If patient develops significant pain at site and is candidate for VP/KP [facility]  [facility] guided bone marrow aspiration and biopsy of left iliac crest on 7/30, with results pending 24 hr urine is pending  2. Recent Left lower Lobe pneumonia  3. Fever  CXR shows improvement of the inferior LLL Pneumonia, but slight increase in the right, which may be suggestive of infiltrate.  CT Angio of the chest and CT of the abdomen and pelvis on 7/28 showed persistent posterior left upper lung pulmonary infiltrate, Mild prominence of the appendix. Very early changes of appendicitis cannot be completely excluded. Severe diffuse axial and appendicular bony changes of myeloma, but similar to prior study.  He received Imipenem and supportive care with IVF, antipyretics Fever has resolved as of 7/29 . Denies chills or night sweats. Clinically stable  4. Leukopenia  WBC today lower, at 1.9 Secondary to #2, #3 and recent chemo (C1 D1 on 6/29)  He continued to be placed on dropplet precautions  He was placed on broad spectrum coverage with Imipenem and  Acyclovir therapy  As of 7/30, counts were improving, with White cell count was 2.7 Antibiotics and precautions were discontinued. Viral syndrome was suspected As of 7/31, WBC is maintained at 2.4. He is Afebrile, and asymptomatic for fever and chills.  5. Anemia of chronic disease  Due to recent chemo, ongoing infection, IVF  He received Imipenem coverage Counts were monitored, today's Hb is 9.2 He had bleeding issues. No transfusion indicated  at this time.   6. DVT prophylaxis  on Lovenox  Platelet counts were closely monitored. Initially they dropped to 127 in the setting of acute illness. These recovered well as of 7/30 at 175, today at 171k No bleeding issues. No transfusion is indicated at this time   7. Full Code  7. Malnutrition Dietitian evaluation was requested, as patient reported some nausea and decreased appetite for past one week; however weight has remained stable. Has been eating 2-3 balanced meals/day of soft foods and soups. Nausea has improved with medications, denied vomitus or abdominal pain post meals. Continued to encouraged balanced meals for recovery during illness  No nutrition interventions were warranted at this time. He continued on regular diet without complications Body mass index is 30.85 kg/(m^2). Patient meets criteria for Obesity I based on current BMI.   Discharge Instructions:      Medication List         acetaminophen 325 MG tablet  Commonly known as:  TYLENOL  Take 650 mg by mouth every 6 (six) hours as needed (pain).     acyclovir 800 MG tablet  Commonly known as:  ZOVIRAX  Take 800 mg by mouth 2 (two) times daily.     aspirin EC 325 MG tablet  Take 1 tablet (325 mg total) by mouth daily.     bisacodyl 5 MG EC tablet  Commonly known as:  DULCOLAX  Take 5 mg by mouth daily as needed for moderate constipation.     calcium-vitamin D 500-200 MG-UNIT per tablet  Commonly known as:  OSCAL WITH D  Take 1 tablet by mouth daily with breakfast.     gabapentin 300 MG capsule  Commonly known as:  NEURONTIN  Take 1-2 capsules (300-600 mg total) by mouth 3 (three) times daily. Take 339m in the morning and afternoon and take 6056min the evening     ibuprofen 200 MG tablet  Commonly known as:  ADVIL,MOTRIN  Take 400 mg by mouth every 6 (six) hours as needed.     lenalidomide 10 MG capsule  Commonly known as:  REVLIMID  Take 1 capsule (10 mg total) by mouth daily.      levofloxacin 500 MG tablet  Commonly known as:  LEVAQUIN  Take 1 tablet (500 mg total) by mouth daily.     oxyCODONE 5 MG immediate release tablet  Commonly known as:  Oxy IR/ROXICODONE  Take 5 mg by mouth every 4 (four) hours as needed for severe pain.     prochlorperazine 10 MG tablet  Commonly known as:  COMPAZINE  Take 1 tablet (10 mg total) by mouth every 6 (six) hours as needed for nausea or vomiting.           Medical Consultants:    Nutrition 7/29  Interventional Radiology 7/30   Discharge Exam:   Filed Vitals:   02/28/14 0549  BP: 109/63  Pulse: 87  Temp: 99 F (37.2  C)  Resp: 18   Filed Vitals:   02/27/14 2250 02/28/14 0208 02/28/14 0330 02/28/14 0549  BP: 114/68 126/86  109/63  Pulse: 99 97  87  Temp: 99.8 F (37.7 C) 100.1 F (37.8 C) 99.4 F (37.4 C) 99 F (37.2 C)  TempSrc: Oral  Oral Oral  Resp: _0 Height:      Weight:    210 lb 8 oz (95.482 kg)  SpO2: 99% 98%  98%    GENERAL:alert, no distress and comfortable SKIN: skin color, texture, turgor are normal, no rashes or significant lesions EYES: normal, conjunctiva are pink and non-injected, sclera clear OROPHARYNX:no exudate, no erythema and lips, buccal mucosa, and tongue normal  NECK: supple, thyroid normal size, non-tender, without nodularity LYMPH:  no palpable lymphadenopathy in the cervical, axillary or inguinal LUNGS: clear to auscultation and percussion with normal breathing effort HEART: regular rate & rhythm and no murmurs and no lower extremity edema ABDOMEN:abdomen soft, non-tender and normal bowel sounds There is no fluid wave. There is no palpable liver spleen tip Musculoskeletal:no cyanosis of digits and no clubbing  PSYCH: alert & oriented x 3 with fluent speech NEURO: no focal motor/sensory deficits    The results of significant diagnostics from this hospitalization (including imaging, microbiology, ancillary and laboratory) are listed below for reference.      Procedures and Diagnostic Studies:   Dg Chest 2 View  02/25/2014  FINDINGS: There has been marked interval improvement in the appearance of the infiltrate in the left upper lobe inferiorly. There remains mildly increased density here. Elsewhere the lungs exhibit no alveolar density. There are coarse lung markings in the infrahilar region today more conspicuous than in the past. The heart is top-normal in size. The pulmonary vascularity is not engorged. The power port appliance is unchanged. The bony thorax is unremarkable. IMPRESSION: There is been significant improvement in the appearance of the inferior left upper lobe pneumonia. However, slightly increased infrahilar lung markings on the right are present and may be reflective of subsegmental atelectasis or developing infiltrate here. Electronically Signed By: David Martinique On: 02/25/2014 12:36   Ct Angio Chest Pe W/cm &/or Wo Cm  02/25/2014  COMPARISON: Chest x-ray 02/25/2014, 07/22/ 2015. CT 07/02/2013. FINDINGS: CTA CHEST FINDINGS Port-A-Cath in good anatomic position. Thoracic aorta is unremarkable. No dissection. No aneurysm. Pulmonary arteries are normal. No pulmonary embolus. No significant mediastinal or hilar adenopathy. Thoracic esophagus is unremarkable. Large airways are patent. Posterior left upper lung prominent pulmonary infiltrate is present. This is consistent with pneumonia. Similar finding noted on prior chest x-ray of 02/19/2014. Bibasilar atelectasis. No pleural effusion or pneumothorax. Thyroid unremarkable. No significant axillary adenopathy. Innumerable bony lesions are again noted throughout the axial and appendicular skeleton consistent with patient's known myeloma. CT ABDOMEN and PELVIS FINDINGS Punctate lucency noted in the right lobe of the liver unchanged and most likely a tiny cyst. Liver is otherwise unremarkable. Spleen is unremarkable. Pancreas unremarkable. No biliary distention. Gallbladder nondistended. Adrenals are  normal. No focal renal abnormalities identified. No hydronephrosis or obstructing ureteral stone. Bladder is nondistended. Stable mild bladder wall thickening. No significant inguinal or retroperitoneal adenopathy. Abdominal aorta normal in caliber. No aneurysm. The visceral vessels are patent. The appendix is prominent at 9 mm. Early changes of appendicitis cannot be excluded. Stool is noted throughout the colon. No evidence of bowel obstruction.Stomach contains debris. No free air. No mesenteric mass. No bowel herniation. Extensive changes of myeloma noted involving the axial and appendicular skeleton.  Similar findings noted on prior exam. Review of the MIP images confirms the above findings. IMPRESSION: 1. Persistent posterior left upper lung pulmonary infiltrate. Similar findings noted on prior chest x-ray 02/19/2014. 2. Mild prominence of the appendix. Very early changes of appendicitis cannot be completely excluded. 3. Again noted are severe diffuse axial and appendicular bony changes of myeloma. Similar findings noted on prior study. These results were called by telephone at the time of interpretation on 02/25/2014 at 7:17 pm to nurse Grayland Ormond, who verbally acknowledged these results. Electronically Signed By: Marcello Moores Register On: 02/25/2014 19:19   Ct Abdomen Pelvis W Contrast  02/25/2014 FINDINGS Punctate lucency noted in the right lobe of the liver unchanged and most likely a tiny cyst. Liver is otherwise unremarkable. Spleen is unremarkable. Pancreas unremarkable. No biliary distention. Gallbladder nondistended. Adrenals are normal. No focal renal abnormalities identified. No hydronephrosis or obstructing ureteral stone. Bladder is nondistended. Stable mild bladder wall thickening. No significant inguinal or retroperitoneal adenopathy. Abdominal aorta normal in caliber. No aneurysm. The visceral vessels are patent. The appendix is prominent at 9 mm. Early changes of appendicitis cannot be excluded. Stool is  noted throughout the colon. No evidence of bowel obstruction.Stomach contains debris. No free air. No mesenteric mass. No bowel herniation. Extensive changes of myeloma noted involving the axial and appendicular skeleton. Similar findings noted on prior exam. Review of the MIP images confirms the above findings. IMPRESSION: 1. Persistent posterior left upper lung pulmonary infiltrate. Similar findings noted on prior chest x-ray 02/19/2014. 2. Mild prominence of the appendix. Very early changes of appendicitis cannot be completely excluded. 3. Again noted are severe diffuse axial and appendicular bony changes of myeloma. Similar findings noted on prior study. These results were called by telephone at the time of interpretation on 02/25/2014 at 7:17 pm to nurse Grayland Ormond, who verbally acknowledged these results. Electronically Signed By: Marcello Moores Register On: 02/25/2014 19:19   MRI LUMBAR SPINE WITHOUT AND WITH CONTRAST   COMPARISON: CT 02/25/2014. MRI 02/01/2013.  FINDINGS:  The study is moderately degraded by motion artifact which affects both sagittal and axial images. The patient was unable the comply with scanning due to pain in the supine position. Technical parameters or modified to accommodate patient discomfort. Segmentation: Numbering used on prior exam is preserved. 5 lumbar  type vertebral bodies. Alignment: Mild dextroconvex curve with the apex at L4. Vertebrae: Infiltration and pathologic compression fracture of the T12 vertebra with progressive collapse associated with treatment related affects when compared to 05/09/2013 CT. Maximal loss of vertebral body height is about 75% but there is no retropulsion. Residual enhancing tumor is present in the T12 vertebra. Small enhancing Schmorl's nodes are present on both sides of L1-L 2. 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 The plasmacytoma previously demonstrated  on MRI has markedly decreased in size and vascularity with areas of T2 hyperintense necrotic/cystic tumor in the region of previously demonstrated mass. Vertebral body height at L5 is preserved. Conus medullaris: Normal at L1-L2. No evidence of intramedullary or intrathecal disease. No central canal epidural tumor or cord compression. Paraspinal tissues: Poorly evaluated due to motion. Evaluation is deferred to recent CT. Disc levels: T11-T12: Sagittal imaging. Minimal disc bulging. Minimal central stenosis. T12-L1: Shallow disc bulging with very mild central stenosis. No cord deformity. L1-L2: Disc desiccation and shallow disc bulging without stenosis. L2-L3: Negative. 3-L4: Mild posterior ligamentum flavum redundancy and facet arthrosis producing very mild central stenosis.  Foramina patent. L4-L5: Mild central stenosis appears unchanged allowing for motion degradation at this level. Shallow broad-based disc bulging and  short pedicles are present. Neural foramina grossly appear adequately patent. There is paraspinal edema around the posterior elements which is most compatible with treatment related changes rather than facet arthritis. L5-S1: Central canal and lateral recesses remain patent. Disc desiccation is present. The RIGH-sided pedicle and transverse process plasmacytoma is markedly improved. This is best evaluated on axial T1 weighted images. The the tumor now measures 5 cm AP x 4.5 cm transverse, previously 6.2 cm by 6.4 cm. Extraosseous portion of the tumor has resolved. There is still peripheral enhancement of the interosseous tumor, with central necrotic nonenhancing tissue. Persistent RIGHT foraminal stenosis associated with tumor and remodeling of the vertebra potentially affecting the RIGHT L5 nerve. Myeloma in both iliac bones is incidentally noted, better seen on prior CT. IMPRESSION: 1. 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 Substantial tumor mass reduction, now  measuring 5 cm x 4.5 cm, previously 6.2 x 6.4 cm. Persistent RIGHT L5-S1 foraminal stenosis potentially affecting the RIGHT L5 nerve. Paraspinal edema and enhancement is likely secondary to radiotherapy rather than degenerative changes. 2. T12 pathologic compression fracture shows progressive collapse with some residual tumor in the fracture cleavage plane compared to prior CT from 2014.  3. Moderately technically degraded study due to motion artifact. 4. No new infiltrating lesions in the lumbar spine. Electronically Signed  By: Dereck Ligas M.D.  On: 02/26/2014 11:52   Sandi Mariscal, MD Physician Signed Radiology Procedures Service date: 02/27/2014 9:36 AM    Pre-procedure Dx: Multiple myeloma, without mention of having achieved remission [203.00]    Post-procedure Dx: Multiple myeloma, without mention of having achieved remission [203.00]    Procedures: CT BIOPSY [VEH2094 (Type: Custom)]   Technically successful CT guided bone marrow aspiration and biopsy of left iliac crest.  No immediate complications.      Labs:   Basic Metabolic Panel:  Recent Labs Lab 02/25/14 1220  02/26/14 0715 02/27/14 0545 02/28/14 0500  NA 140  --  139 136* 137  K 4.4  < > 3.9 4.8 4.7  CL 99  --  105 101 102  CO2 26  --  _0 GLUCOSE 92  --  93 106* 107*  BUN 12  --  _1 CREATININE 1.3*  --  1.09 1.18 1.12  CALCIUM 8.6  --  7.4* 9.1 9.3  < > = values in this interval not displayed. GFR Estimated Creatinine Clearance: 99 ml/min (by C-G formula based on Cr of 1.12). Liver Function Tests:  Recent Labs Lab 02/25/14 1220 02/26/14 0715 02/27/14 0545 02/28/14 0500  AST 37 _2 ALT 56* 38 39 30  ALKPHOS 82 78 90 81  BILITOT 1.10 0.8 0.9 0.6  PROT 7.8 6.1 7.3 6.8  ALBUMIN  --  2.8* 3.6 3.3*    CBC:  Recent Labs Lab 02/25/14 1220 02/26/14 0715 02/27/14 0545 02/28/14 0500  WBC 2.2* 1.9* 2.7* 2.4*  NEUTROABS 1.0*  --  1.5*  --   HGB 10.2* 8.3* 9.8* 9.2*  HCT 30.4*  25.1* 28.5* 27.9*  MCV 95 94.0 91.9 93.6  PLT 143* 127* 175 171    Signed:  WERTMAN,SARA E  02/28/2014, 8:03 AM   ADDENDUM:  I agree with the above note. We admitted Mr. Tubbs because of these fevers. All cultures were negative. His blood counts were going down.  He we  had him on empiric antibiotics. We had him on Primaxin. Again, all cultures were negative. He had no temperature while in the hospital.  We did do CT scans. It showed that his appendix was not enlarged. I spoke with one of the surgical colleagues. They said that if he is asymptomatic and afebrile with no white cell elevation that this could be followed.  We did an MRI of his back. He had changes consistent with a response to past treatment. He did have some stenosis at the right L5-S1 nerve root. This had been causing pain and numbness in the right leg when he lies down.  He had no nausea vomiting. He was in better. We did do a 24-hour urine on him. This is pending.  We did get a bone marrow biopsy on him. This is pending.  We stop the antibiotics on July 30. We watched him for a day. He did not have any fevers.  I felt that we could get him home.  On his MRI, it did show a compression fracture at T12. I thought maybe we could consider a kyphoplasty. I spoke with interventional radiology. Since he was asymptomatic in this area, they did not feel that any intervention was necessary. I do appreciate their consultation.  He had no problems going to the bathroom. He was not constipated.  Had no bleeding.  Had no mouth sores.  There were no visual issues.  I felt that we could get him home on the 31st. He is going to be back in the office in 4 or 5 days for treatment.  Told him to stay off the Revlimid until we saw him.  I told him to get on aspirin 325 mg by mouth daily. He should be taking this daily. I told him to take this with food.  He has an appointment with his transplant doctors at [facility] in 2  weeks.   Mark [name] 29:11

## 2014-02-28 NOTE — Progress Notes (Signed)
Discharge instructions reviewed with patient using teach back method and he demonstrated understanding.  Patient stable for discharge.  Assessment unchanged from this am.  Zandra Abts [facility]  02/28/2014  9:53 AM

## 2014-03-03 LAB — UIFE/LIGHT CHAINS/TP QN, 24-HR UR
ALBUMIN, U: DETECTED
Alpha 1, Urine: DETECTED — AB
Alpha 2, Urine: DETECTED — AB
Beta, Urine: DETECTED — AB
FREE KAPPA LT CHAINS, UR: 18.1 mg/dL — AB (ref 0.14–2.42)
FREE LAMBDA EXCRETION/DAY: 32.25 mg/d
FREE LAMBDA LT CHAINS, UR: 1.29 mg/dL — AB (ref 0.02–0.67)
FREE LT CHN EXCR RATE: 452.5 mg/d
Free Kappa/Lambda Ratio: 14.03 ratio — ABNORMAL HIGH (ref 2.04–10.37)
GAMMA UR: DETECTED — AB
TIME-UPE24: 24 h
Total Protein, Urine-Ur/day: 513 mg/d — ABNORMAL HIGH (ref 10–140)
Total Protein, Urine: 20.5 mg/dL
VOLUME, URINE-UPE24: 2500 mL

## 2014-03-05 ENCOUNTER — Ambulatory Visit (HOSPITAL_BASED_OUTPATIENT_CLINIC_OR_DEPARTMENT_OTHER): Payer: BC Managed Care – PPO

## 2014-03-05 ENCOUNTER — Encounter: Payer: Self-pay | Admitting: Family

## 2014-03-05 ENCOUNTER — Ambulatory Visit (HOSPITAL_BASED_OUTPATIENT_CLINIC_OR_DEPARTMENT_OTHER): Payer: BC Managed Care – PPO | Admitting: Lab

## 2014-03-05 ENCOUNTER — Ambulatory Visit (HOSPITAL_BASED_OUTPATIENT_CLINIC_OR_DEPARTMENT_OTHER): Payer: BC Managed Care – PPO | Admitting: Family

## 2014-03-05 VITALS — BP 123/77 | HR 92 | Temp 97.1°F | Resp 18 | Ht 69.0 in | Wt 206.0 lb

## 2014-03-05 DIAGNOSIS — J189 Pneumonia, unspecified organism: Secondary | ICD-10-CM

## 2014-03-05 DIAGNOSIS — C9000 Multiple myeloma not having achieved remission: Secondary | ICD-10-CM

## 2014-03-05 DIAGNOSIS — J181 Lobar pneumonia, unspecified organism: Secondary | ICD-10-CM

## 2014-03-05 DIAGNOSIS — Z5112 Encounter for antineoplastic immunotherapy: Secondary | ICD-10-CM

## 2014-03-05 LAB — CMP (CANCER CENTER ONLY)
ALT: 19 U/L (ref 10–47)
AST: 24 U/L (ref 11–38)
Albumin: 4 g/dL (ref 3.3–5.5)
Alkaline Phosphatase: 61 U/L (ref 26–84)
BILIRUBIN TOTAL: 1.4 mg/dL (ref 0.20–1.60)
BUN, Bld: 23 mg/dL — ABNORMAL HIGH (ref 7–22)
CO2: 26 mEq/L (ref 18–33)
Calcium: 8.4 mg/dL (ref 8.0–10.3)
Chloride: 97 mEq/L — ABNORMAL LOW (ref 98–108)
Creat: 1.5 mg/dl — ABNORMAL HIGH (ref 0.6–1.2)
Glucose, Bld: 100 mg/dL (ref 73–118)
Potassium: 4.1 mEq/L (ref 3.3–4.7)
Sodium: 138 mEq/L (ref 128–145)
TOTAL PROTEIN: 8 g/dL (ref 6.4–8.1)

## 2014-03-05 LAB — CBC WITH DIFFERENTIAL (CANCER CENTER ONLY)
BASO#: 0 10*3/uL (ref 0.0–0.2)
BASO%: 0.3 % (ref 0.0–2.0)
EOS%: 4.4 % (ref 0.0–7.0)
Eosinophils Absolute: 0.1 10*3/uL (ref 0.0–0.5)
HEMATOCRIT: 30.6 % — AB (ref 38.7–49.9)
HGB: 10.2 g/dL — ABNORMAL LOW (ref 13.0–17.1)
LYMPH#: 0.7 10*3/uL — ABNORMAL LOW (ref 0.9–3.3)
LYMPH%: 21.5 % (ref 14.0–48.0)
MCH: 31.8 pg (ref 28.0–33.4)
MCHC: 33.3 g/dL (ref 32.0–35.9)
MCV: 95 fL (ref 82–98)
MONO#: 0.5 10*3/uL (ref 0.1–0.9)
MONO%: 16.8 % — ABNORMAL HIGH (ref 0.0–13.0)
NEUT#: 1.8 10*3/uL (ref 1.5–6.5)
NEUT%: 57 % (ref 40.0–80.0)
Platelets: 219 10*3/uL (ref 145–400)
RBC: 3.21 10*6/uL — ABNORMAL LOW (ref 4.20–5.70)
RDW: 14.7 % (ref 11.1–15.7)
WBC: 3.2 10*3/uL — ABNORMAL LOW (ref 4.0–10.0)

## 2014-03-05 MED ORDER — SODIUM CHLORIDE 0.9 % IV SOLN
Freq: Once | INTRAVENOUS | Status: AC
  Administered 2014-03-05: 12:00:00 via INTRAVENOUS

## 2014-03-05 MED ORDER — OXYCODONE HCL 5 MG PO TABS: 5.0000 mg | ORAL_TABLET | ORAL | Status: DC | PRN

## 2014-03-05 MED ORDER — ZOLEDRONIC ACID 4 MG/100ML IV SOLN
4.0000 mg | Freq: Once | INTRAVENOUS | Status: AC
  Administered 2014-03-05: 4 mg via INTRAVENOUS
  Filled 2014-03-05: qty 100

## 2014-03-05 MED ORDER — GABAPENTIN 300 MG PO CAPS: 300.0000 mg | ORAL_CAPSULE | Freq: Three times a day (TID) | ORAL | Status: DC

## 2014-03-05 MED ORDER — ACYCLOVIR 800 MG PO TABS: 800.0000 mg | ORAL_TABLET | Freq: Two times a day (BID) | ORAL | Status: DC

## 2014-03-05 MED ORDER — ONDANSETRON HCL 8 MG PO TABS
8.0000 mg | ORAL_TABLET | Freq: Once | ORAL | Status: AC
  Administered 2014-03-05: 8 mg via ORAL

## 2014-03-05 MED ORDER — HEPARIN SOD (PORK) LOCK FLUSH 100 UNIT/ML IV SOLN
500.0000 [IU] | Freq: Once | INTRAVENOUS | Status: DC | PRN
  Filled 2014-03-05: qty 5

## 2014-03-05 MED ORDER — SODIUM CHLORIDE 0.9 % IJ SOLN
3.0000 mL | Freq: Once | INTRAMUSCULAR | Status: DC | PRN
  Filled 2014-03-05: qty 10

## 2014-03-05 MED ORDER — BORTEZOMIB CHEMO SQ INJECTION 3.5 MG (2.5MG/ML)
1.3000 mg/m2 | Freq: Once | INTRAMUSCULAR | Status: AC
  Administered 2014-03-05: 2.75 mg via SUBCUTANEOUS
  Filled 2014-03-05: qty 2.75

## 2014-03-05 MED ORDER — ONDANSETRON HCL 8 MG PO TABS
ORAL_TABLET | ORAL | Status: AC
  Filled 2014-03-05: qty 1

## 2014-03-05 NOTE — Progress Notes (Signed)
[facility]  Telephone:(336) [phone] Fax:(336) [phone]  ID: Mark [name] OB: [birth year] MR#: [id number]#:[id number] Patient Care Team: No Pcp Per Patient as PCP - General (General Practice)  DIAGNOSIS: Kappa light chain myeloma  INTERVAL HISTORY: Mr. Wherry is here today for follow-up. He is feeling much better after his recent hospital admission. He has had no fevers. He denies n/v, blurred vision, constipation, diarrhea, cough, rash, chest pain, palpitations, abdominal pain, problems urinating or blood in urine or stool.  He denies swelling, tenderness, numbness in his extremities. He does have some mild tingling in his feet that is tolerable. He has had no bone pain. His WBC count today is 3.2. His energy level is better and he is back at work.   CURRENT TREATMENT: Revlimid 38m po q day  Zometa 4 mg IV q. month  Velcade q 3 week dosing  REVIEW OF SYSTEMS: All other 10 point review of systems is negative.   PAST MEDICAL HISTORY: Past Medical History  Diagnosis Date  . History of radiation therapy 02/07/13- 02/26/13    lower L spine, upper sacrum, 35 gray in 14 fractions  . Cancer   . Multiple myeloma dx'd 01/2013   PAST SURGICAL HISTORY: Past Surgical History  Procedure Laterality Date  . Portacath placement    . Bone marrow transplant     FAMILY HISTORY Family History  Problem Relation Age of Onset  . Diabetic kidney disease Mother   . Hypertension Mother   . Heart attack Mother   . Kidney failure Mother   . Coronary artery disease Mother   . HIV Father    GYNECOLOGIC HISTORY:  No LMP for male patient.   SOCIAL HISTORY:  History   Social History  . Marital Status: Married    Spouse Name: N/A    Number of Children: N/A  . Years of Education: N/A   Occupational History  . Not on file.   Social History Main Topics  . Smoking status: Former Smoker -- 1.00 packs/day for 15 years    Types: Cigarettes    Start date: 06/29/1988    Quit date:  06/29/2003  . Smokeless tobacco: Never Used     Comment: quit smoking 10 years ago  . Alcohol Use: No  . Drug Use: No  . Sexual Activity: No   Other Topics Concern  . Not on file   Social History Narrative  . No narrative on file   ADVANCED DIRECTIVES: <no information>  HEALTH MAINTENANCE: History  Substance Use Topics  . Smoking status: Former Smoker -- 1.00 packs/day for 15 years    Types: Cigarettes    Start date: 06/29/1988    Quit date: 06/29/2003  . Smokeless tobacco: Never Used     Comment: quit smoking 10 years ago  . Alcohol Use: No   Colonoscopy: PAP: Bone density: Lipid panel:  No Known Allergies  Current Outpatient Prescriptions  Medication Sig Dispense Refill  . acetaminophen (TYLENOL) 325 MG tablet Take 650 mg by mouth every 6 (six) hours as needed (pain).      .Marland Kitchenacyclovir (ZOVIRAX) 800 MG tablet Take 1 tablet (800 mg total) by mouth 2 (two) times daily.  30 tablet  0  . aspirin EC 325 MG tablet Take 1 tablet (325 mg total) by mouth daily.  100 tablet  3  . bisacodyl (DULCOLAX) 5 MG EC tablet Take 5 mg by mouth daily as needed for moderate constipation.      . calcium-vitamin D (OSCAL  WITH D) 500-200 MG-UNIT per tablet Take 1 tablet by mouth daily with breakfast.      . gabapentin (NEURONTIN) 300 MG capsule Take 1-2 capsules (300-600 mg total) by mouth 3 (three) times daily. Take 361m in the morning and afternoon and take 6070min the evening  120 capsule  3  . ibuprofen (ADVIL,MOTRIN) 200 MG tablet Take 400 mg by mouth every 6 (six) hours as needed.      . Marland Kitchenenalidomide (REVLIMID) 10 MG capsule Take 1 capsule (10 mg total) by mouth daily.  28 capsule  0  . oxyCODONE (OXY IR/ROXICODONE) 5 MG immediate release tablet Take 1 tablet (5 mg total) by mouth every 4 (four) hours as needed for severe pain.  30 tablet  0  . prochlorperazine (COMPAZINE) 10 MG tablet Take 1 tablet (10 mg total) by mouth every 6 (six) hours as needed for nausea or vomiting.  30 tablet  1    No current facility-administered medications for this visit.   Facility-Administered Medications Ordered in Other Visits  Medication Dose Route Frequency Provider Last Rate Last Dose  . 0.9 %  sodium chloride infusion   Intravenous Once PeVolanda NapoleonMD      . bortezomib SQ (VELCADE) chemo injection 2.75 mg  1.3 mg/m2 (Treatment Plan Actual) Subcutaneous Once PeVolanda NapoleonMD      . heparin lock flush 100 unit/mL  500 Units Intracatheter Once PRN PeVolanda NapoleonMD      . sodium chloride 0.9 % injection 3 mL  3 mL Intravenous Once PRN PeVolanda NapoleonMD      . Zoledronic Acid (ZOMETA) 4 mg IVPB  4 mg Intravenous Once PeVolanda NapoleonMD       OBJECTIVE: Filed Vitals:   03/05/14 1006  BP: 123/77  Pulse: 92  Temp: 97.1 F (36.2 C)  Resp: 18   Body mass index is 30.41 kg/(m^2). ECOG FS:0 - Asymptomatic Ocular: Sclerae unicteric, pupils equal, round and reactive to light Ear-nose-throat: Oropharynx clear, dentition fair Lymphatic: No cervical or supraclavicular adenopathy Lungs no rales or rhonchi, good excursion bilaterally Heart regular rate and rhythm, no murmur appreciated Abd soft, nontender, positive bowel sounds MSK no focal spinal tenderness, no joint edema Neuro: non-focal, well-oriented, appropriate affect  LAB RESULTS: CMP     Component Value Date/Time   NA 138 03/05/2014 0927   NA 137 02/28/2014 0500   NA 145 02/21/2013 0908   K 4.1 03/05/2014 0927   K 4.7 02/28/2014 0500   K 3.8 02/21/2013 0908   CL 97* 03/05/2014 0927   CL 102 02/28/2014 0500   CO2 26 03/05/2014 0927   CO2 23 02/28/2014 0500   CO2 32* 02/21/2013 0908   GLUCOSE 100 03/05/2014 0927   GLUCOSE 107* 02/28/2014 0500   GLUCOSE 115 02/21/2013 0908   BUN 23* 03/05/2014 0927   BUN 14 02/28/2014 0500   BUN 19.5 02/21/2013 0908   CREATININE 1.5* 03/05/2014 0927   CREATININE 1.12 02/28/2014 0500   CREATININE 1.5* 02/21/2013 0908   CALCIUM 8.4 03/05/2014 0927   CALCIUM 9.3 02/28/2014 0500   CALCIUM 17.7 Repeated and  Verified* 02/21/2013 0908   CALCIUM >15.0* 02/01/2013 1230   PROT 8.0 03/05/2014 0927   PROT 6.8 02/28/2014 0500   ALBUMIN 3.3* 02/28/2014 0500   AST 24 03/05/2014 0927   AST 21 02/28/2014 0500   ALT 19 03/05/2014 0927   ALT 30 02/28/2014 0500   ALKPHOS 61 03/05/2014 0927   ALKPHOS  81 02/28/2014 0500   BILITOT 1.40 03/05/2014 0927   BILITOT 0.6 02/28/2014 0500   GFRNONAA 80* 02/28/2014 0500   GFRAA >90 02/28/2014 0500   No results found for this basename: SPEP, UPEP,  kappa and lambda light chains   Lab Results  Component Value Date   WBC 3.2* 03/05/2014   NEUTROABS 1.8 03/05/2014   HGB 10.2* 03/05/2014   HCT 30.6* 03/05/2014   MCV 95 03/05/2014   PLT 219 03/05/2014   No results found for this basename: LABCA2   No components found with this basename: [id number]   No results found for this basename: INR,  in the last 168 hours  STUDIES: None  ASSESSMENT/PLAN:Mr. Adames is 43 year old done. 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 This was in January or early February. He is feeling much better today.  We will proceed with his treatment today as planned.  His labs are much improved. I gave him a copy of his MRI and discussed this with him.  I refilled his acyclovir, neurontin and oxycodone today.  We will see him back in 1 month for labs and follow-up.  All questions were answered and he knows to call here with any questions or concerns. We can certainly see him sooner if need be.    [name], NP 03/05/2014 11:11 AM

## 2014-03-05 NOTE — Patient Instructions (Signed)
[facility] Discharge Instructions for Patients Receiving Chemotherapy  Today you received the following chemotherapy agents: Velcade.  To help prevent nausea and vomiting after your treatment, we encourage you to take your nausea medication as prescribed.   If you develop nausea and vomiting that is not controlled by your nausea medication, call the clinic.   BELOW ARE SYMPTOMS THAT SHOULD BE REPORTED IMMEDIATELY:  *FEVER GREATER THAN 100.5 F  *CHILLS WITH OR WITHOUT FEVER  NAUSEA AND VOMITING THAT IS NOT CONTROLLED WITH YOUR NAUSEA MEDICATION  *UNUSUAL SHORTNESS OF BREATH  *UNUSUAL BRUISING OR BLEEDING  TENDERNESS IN MOUTH AND THROAT WITH OR WITHOUT PRESENCE OF ULCERS  *URINARY PROBLEMS  *BOWEL PROBLEMS  UNUSUAL RASH Items with * indicate a potential emergency and should be followed up as soon as possible.  Feel free to call the clinic you have any questions or concerns. The clinic phone number is [phone].    

## 2014-03-06 ENCOUNTER — Ambulatory Visit: Payer: Self-pay

## 2014-03-06 ENCOUNTER — Ambulatory Visit: Payer: Self-pay | Admitting: Family

## 2014-03-06 ENCOUNTER — Telehealth: Payer: Self-pay | Admitting: *Deleted

## 2014-03-06 ENCOUNTER — Other Ambulatory Visit: Payer: Self-pay | Admitting: Lab

## 2014-03-06 NOTE — Telephone Encounter (Signed)
4:26 PM   Pt called today to ask Dr. Marin Olp when to restart back on his Revlimid ( pt states he was told to stop them when he had pneumonia ). Per Dr. Marin Olp tell patient to restart taking them today.  Pt was called on phone [phone] and told to restart today.

## 2014-03-07 LAB — IFE INTERPRETATION

## 2014-03-07 LAB — IGG, IGA, IGM
IgA: 85 mg/dL (ref 68–379)
IgG (Immunoglobin G), Serum: 1120 mg/dL (ref 650–1600)
IgM, Serum: 23 mg/dL — ABNORMAL LOW (ref 41–251)

## 2014-03-07 LAB — PROTEIN ELECTROPHORESIS, SERUM, WITH REFLEX
ALPHA-1-GLOBULIN: 6.3 % — AB (ref 2.9–4.9)
Albumin ELP: 55.6 % — ABNORMAL LOW (ref 55.8–66.1)
Alpha-2-Globulin: 13.6 % — ABNORMAL HIGH (ref 7.1–11.8)
Beta 2: 5.6 % (ref 3.2–6.5)
Beta Globulin: 5 % (ref 4.7–7.2)
GAMMA GLOBULIN: 13.9 % (ref 11.1–18.8)
Total Protein, Serum Electrophoresis: 7.4 g/dL (ref 6.0–8.3)

## 2014-03-07 LAB — BETA 2 MICROGLOBULIN, SERUM: BETA 2 MICROGLOBULIN: 3.84 mg/L — AB (ref <=2.51)

## 2014-03-07 LAB — KAPPA/LAMBDA LIGHT CHAINS
KAPPA FREE LGHT CHN: 2.75 mg/dL — AB (ref 0.33–1.94)
KAPPA LAMBDA RATIO: 1.62 (ref 0.26–1.65)
Lambda Free Lght Chn: 1.7 mg/dL (ref 0.57–2.63)

## 2014-03-07 LAB — LACTATE DEHYDROGENASE: LDH: 262 U/L — AB (ref 94–250)

## 2014-03-10 ENCOUNTER — Encounter: Payer: Self-pay | Admitting: Hematology & Oncology

## 2014-03-10 LAB — CHROMOSOME ANALYSIS, BONE MARROW

## 2014-03-19 ENCOUNTER — Ambulatory Visit: Payer: Self-pay | Admitting: Family

## 2014-03-19 ENCOUNTER — Other Ambulatory Visit: Payer: Self-pay | Admitting: Lab

## 2014-03-19 ENCOUNTER — Ambulatory Visit: Payer: Self-pay

## 2014-03-20 ENCOUNTER — Other Ambulatory Visit: Payer: Self-pay | Admitting: Lab

## 2014-03-20 ENCOUNTER — Ambulatory Visit: Payer: Self-pay

## 2014-03-20 ENCOUNTER — Other Ambulatory Visit: Payer: Self-pay | Admitting: *Deleted

## 2014-03-20 ENCOUNTER — Ambulatory Visit (HOSPITAL_BASED_OUTPATIENT_CLINIC_OR_DEPARTMENT_OTHER): Payer: BC Managed Care – PPO | Admitting: Lab

## 2014-03-20 ENCOUNTER — Ambulatory Visit: Payer: Self-pay | Admitting: Family

## 2014-03-20 ENCOUNTER — Ambulatory Visit (HOSPITAL_BASED_OUTPATIENT_CLINIC_OR_DEPARTMENT_OTHER): Payer: BC Managed Care – PPO

## 2014-03-20 VITALS — BP 121/75 | HR 80 | Temp 97.3°F | Resp 16

## 2014-03-20 DIAGNOSIS — C9000 Multiple myeloma not having achieved remission: Secondary | ICD-10-CM

## 2014-03-20 DIAGNOSIS — Z5112 Encounter for antineoplastic immunotherapy: Secondary | ICD-10-CM

## 2014-03-20 LAB — CBC WITH DIFFERENTIAL (CANCER CENTER ONLY)
BASO#: 0 10*3/uL (ref 0.0–0.2)
BASO%: 0.3 % (ref 0.0–2.0)
EOS%: 6.2 % (ref 0.0–7.0)
Eosinophils Absolute: 0.2 10*3/uL (ref 0.0–0.5)
HEMATOCRIT: 30.8 % — AB (ref 38.7–49.9)
HEMOGLOBIN: 10.1 g/dL — AB (ref 13.0–17.1)
LYMPH#: 1.2 10*3/uL (ref 0.9–3.3)
LYMPH%: 29.8 % (ref 14.0–48.0)
MCH: 32.2 pg (ref 28.0–33.4)
MCHC: 32.8 g/dL (ref 32.0–35.9)
MCV: 98 fL (ref 82–98)
MONO#: 0.6 10*3/uL (ref 0.1–0.9)
MONO%: 14.5 % — AB (ref 0.0–13.0)
NEUT#: 1.9 10*3/uL (ref 1.5–6.5)
NEUT%: 49.2 % (ref 40.0–80.0)
Platelets: 169 10*3/uL (ref 145–400)
RBC: 3.14 10*6/uL — ABNORMAL LOW (ref 4.20–5.70)
RDW: 16.2 % — AB (ref 11.1–15.7)
WBC: 3.9 10*3/uL — ABNORMAL LOW (ref 4.0–10.0)

## 2014-03-20 MED ORDER — BORTEZOMIB CHEMO SQ INJECTION 3.5 MG (2.5MG/ML)
1.3000 mg/m2 | Freq: Once | INTRAMUSCULAR | Status: AC
  Administered 2014-03-20: 2.75 mg via SUBCUTANEOUS
  Filled 2014-03-20: qty 2.75

## 2014-03-20 MED ORDER — ONDANSETRON HCL 8 MG PO TABS
8.0000 mg | ORAL_TABLET | Freq: Once | ORAL | Status: AC
  Administered 2014-03-20: 8 mg via ORAL

## 2014-03-20 MED ORDER — ONDANSETRON HCL 8 MG PO TABS
ORAL_TABLET | ORAL | Status: AC
  Filled 2014-03-20: qty 1

## 2014-03-20 NOTE — Patient Instructions (Signed)
Bortezomib injection What is this medicine? BORTEZOMIB (bor TEZ oh mib) is a chemotherapy drug. It slows the growth of cancer cells. This medicine is used to treat multiple myeloma, and certain lymphomas, such as mantle-cell lymphoma. This medicine may be used for other purposes; ask your health care provider or pharmacist if you have questions. COMMON BRAND NAME(S): Velcade What should I tell my health care provider before I take this medicine? They need to know if you have any of these conditions: -diabetes -heart disease -irregular heartbeat -liver disease -on hemodialysis -low blood counts, like low white blood cells, platelets, or hemoglobin -peripheral neuropathy -taking medicine for blood pressure -an unusual or allergic reaction to bortezomib, mannitol, boron, other medicines, foods, dyes, or preservatives -pregnant or trying to get pregnant -breast-feeding How should I use this medicine? This medicine is for injection into a vein or for injection under the skin. It is given by a health care professional in a hospital or clinic setting. Talk to your pediatrician regarding the use of this medicine in children. Special care may be needed. Overdosage: If you think you have taken too much of this medicine contact a poison control center or emergency room at once. NOTE: This medicine is only for you. Do not share this medicine with others. What if I miss a dose? It is important not to miss your dose. Call your doctor or health care professional if you are unable to keep an appointment. What may interact with this medicine? This medicine may interact with the following medications: -ketoconazole -rifampin -ritonavir -St. John's Wort This list may not describe all possible interactions. Give your health care provider a list of all the medicines, herbs, non-prescription drugs, or dietary supplements you use. Also tell them if you smoke, drink alcohol, or use illegal drugs. Some items  may interact with your medicine. What should I watch for while using this medicine? Visit your doctor for checks on your progress. This drug may make you feel generally unwell. This is not uncommon, as chemotherapy can affect healthy cells as well as cancer cells. Report any side effects. Continue your course of treatment even though you feel ill unless your doctor tells you to stop. You may get drowsy or dizzy. Do not drive, use machinery, or do anything that needs mental alertness until you know how this medicine affects you. Do not stand or sit up quickly, especially if you are an older patient. This reduces the risk of dizzy or fainting spells. In some cases, you may be given additional medicines to help with side effects. Follow all directions for their use. Call your doctor or health care professional for advice if you get a fever, chills or sore throat, or other symptoms of a cold or flu. Do not treat yourself. This drug decreases your body's ability to fight infections. Try to avoid being around people who are sick. This medicine may increase your risk to bruise or bleed. Call your doctor or health care professional if you notice any unusual bleeding. You may need blood work done while you are taking this medicine. In some patients, this medicine may cause a serious brain infection that may cause death. If you have any problems seeing, thinking, speaking, walking, or standing, tell your doctor right away. If you cannot reach your doctor, urgently seek other source of medical care. Do not become pregnant while taking this medicine. Women should inform their doctor if they wish to become pregnant or think they might be pregnant. There is   a potential for serious side effects to an unborn child. Talk to your health care professional or pharmacist for more information. Do not breast-feed an infant while taking this medicine. Check with your doctor or health care professional if you get an attack of  severe diarrhea, nausea and vomiting, or if you sweat a lot. The loss of too much body fluid can make it dangerous for you to take this medicine. What side effects may I notice from receiving this medicine? Side effects that you should report to your doctor or health care professional as soon as possible: -allergic reactions like skin rash, itching or hives, swelling of the face, lips, or tongue -breathing problems -changes in hearing -changes in vision -fast, irregular heartbeat -feeling faint or lightheaded, falls -pain, tingling, numbness in the hands or feet -right upper belly pain -seizures -swelling of the ankles, feet, hands -unusual bleeding or bruising -unusually weak or tired -vomiting -yellowing of the eyes or skin Side effects that usually do not require medical attention (report to your doctor or health care professional if they continue or are bothersome): -changes in emotions or moods -constipation -diarrhea -loss of appetite -headache -irritation at site where injected -nausea This list may not describe all possible side effects. Call your doctor for medical advice about side effects. You may report side effects to FDA at 1-800-FDA-1088. Where should I keep my medicine? This drug is given in a hospital or clinic and will not be stored at home. NOTE: This sheet is a summary. It may not cover all possible information. If you have questions about this medicine, talk to your doctor, pharmacist, or health care provider.  2015, [employer]vier/Gold Standard. (2013-05-13 12:46:32)  

## 2014-03-24 LAB — PROTEIN ELECTROPHORESIS, SERUM, WITH REFLEX
ALBUMIN ELP: 57.1 % (ref 55.8–66.1)
Alpha-1-Globulin: 5.9 % — ABNORMAL HIGH (ref 2.9–4.9)
Alpha-2-Globulin: 9.4 % (ref 7.1–11.8)
BETA GLOBULIN: 5.2 % (ref 4.7–7.2)
Beta 2: 4.9 % (ref 3.2–6.5)
Gamma Globulin: 17.5 % (ref 11.1–18.8)
M-Spike, %: 0.41 g/dL
Total Protein, Serum Electrophoresis: 7 g/dL (ref 6.0–8.3)

## 2014-03-24 LAB — IGG, IGA, IGM
IGM, SERUM: 139 mg/dL (ref 41–251)
IgA: 88 mg/dL (ref 68–379)
IgG (Immunoglobin G), Serum: 1210 mg/dL (ref 650–1600)

## 2014-03-24 LAB — COMPREHENSIVE METABOLIC PANEL
ALK PHOS: 46 U/L (ref 39–117)
ALT: 14 U/L (ref 0–53)
AST: 18 U/L (ref 0–37)
Albumin: 4.1 g/dL (ref 3.5–5.2)
BILIRUBIN TOTAL: 1.8 mg/dL — AB (ref 0.2–1.2)
BUN: 13 mg/dL (ref 6–23)
CALCIUM: 8.4 mg/dL (ref 8.4–10.5)
CO2: 24 mEq/L (ref 19–32)
Chloride: 107 mEq/L (ref 96–112)
Creatinine, Ser: 1.33 mg/dL (ref 0.50–1.35)
Glucose, Bld: 97 mg/dL (ref 70–99)
Potassium: 4.1 mEq/L (ref 3.5–5.3)
Sodium: 141 mEq/L (ref 135–145)
Total Protein: 7 g/dL (ref 6.0–8.3)

## 2014-03-24 LAB — KAPPA/LAMBDA LIGHT CHAINS
KAPPA LAMBDA RATIO: 0.43 (ref 0.26–1.65)
Kappa free light chain: 4.04 mg/dL — ABNORMAL HIGH (ref 0.33–1.94)
LAMBDA FREE LGHT CHN: 9.49 mg/dL — AB (ref 0.57–2.63)

## 2014-03-24 LAB — IFE INTERPRETATION

## 2014-03-24 LAB — BETA 2 MICROGLOBULIN, SERUM: Beta-2 Microglobulin: 3.29 mg/L — ABNORMAL HIGH (ref <=2.51)

## 2014-03-24 LAB — LACTATE DEHYDROGENASE: LDH: 283 U/L — ABNORMAL HIGH (ref 94–250)

## 2014-04-02 ENCOUNTER — Ambulatory Visit: Payer: Self-pay | Admitting: Family

## 2014-04-02 ENCOUNTER — Ambulatory Visit: Payer: Self-pay

## 2014-04-02 ENCOUNTER — Other Ambulatory Visit: Payer: Self-pay | Admitting: Lab

## 2014-04-03 ENCOUNTER — Ambulatory Visit (HOSPITAL_BASED_OUTPATIENT_CLINIC_OR_DEPARTMENT_OTHER): Payer: BC Managed Care – PPO

## 2014-04-03 ENCOUNTER — Ambulatory Visit (HOSPITAL_BASED_OUTPATIENT_CLINIC_OR_DEPARTMENT_OTHER): Payer: BC Managed Care – PPO | Admitting: Family

## 2014-04-03 ENCOUNTER — Encounter: Payer: Self-pay | Admitting: Family

## 2014-04-03 ENCOUNTER — Other Ambulatory Visit (HOSPITAL_BASED_OUTPATIENT_CLINIC_OR_DEPARTMENT_OTHER): Payer: BC Managed Care – PPO | Admitting: Lab

## 2014-04-03 VITALS — BP 125/90 | HR 81 | Temp 97.9°F | Resp 18 | Ht 69.0 in | Wt 207.0 lb

## 2014-04-03 DIAGNOSIS — C9000 Multiple myeloma not having achieved remission: Secondary | ICD-10-CM

## 2014-04-03 DIAGNOSIS — Z5112 Encounter for antineoplastic immunotherapy: Secondary | ICD-10-CM

## 2014-04-03 LAB — CBC WITH DIFFERENTIAL (CANCER CENTER ONLY)
BASO#: 0 10*3/uL (ref 0.0–0.2)
BASO%: 0.4 % (ref 0.0–2.0)
EOS%: 11.9 % — ABNORMAL HIGH (ref 0.0–7.0)
Eosinophils Absolute: 0.3 10*3/uL (ref 0.0–0.5)
HEMATOCRIT: 33.3 % — AB (ref 38.7–49.9)
HGB: 10.9 g/dL — ABNORMAL LOW (ref 13.0–17.1)
LYMPH#: 0.9 10*3/uL (ref 0.9–3.3)
LYMPH%: 33.2 % (ref 14.0–48.0)
MCH: 31.8 pg (ref 28.0–33.4)
MCHC: 32.7 g/dL (ref 32.0–35.9)
MCV: 97 fL (ref 82–98)
MONO#: 0.4 10*3/uL (ref 0.1–0.9)
MONO%: 15.9 % — ABNORMAL HIGH (ref 0.0–13.0)
NEUT#: 1.1 10*3/uL — ABNORMAL LOW (ref 1.5–6.5)
NEUT%: 38.6 % — AB (ref 40.0–80.0)
Platelets: 184 10*3/uL (ref 145–400)
RBC: 3.43 10*6/uL — ABNORMAL LOW (ref 4.20–5.70)
RDW: 15.4 % (ref 11.1–15.7)
WBC: 2.8 10*3/uL — ABNORMAL LOW (ref 4.0–10.0)

## 2014-04-03 LAB — COMPREHENSIVE METABOLIC PANEL
ALT: 16 U/L (ref 0–53)
AST: 22 U/L (ref 0–37)
Albumin: 4.3 g/dL (ref 3.5–5.2)
Alkaline Phosphatase: 48 U/L (ref 39–117)
BUN: 13 mg/dL (ref 6–23)
CALCIUM: 8.9 mg/dL (ref 8.4–10.5)
CO2: 26 meq/L (ref 19–32)
CREATININE: 1.2 mg/dL (ref 0.50–1.35)
Chloride: 106 mEq/L (ref 96–112)
GLUCOSE: 104 mg/dL — AB (ref 70–99)
Potassium: 4 mEq/L (ref 3.5–5.3)
Sodium: 141 mEq/L (ref 135–145)
Total Bilirubin: 2 mg/dL — ABNORMAL HIGH (ref 0.2–1.2)
Total Protein: 6.9 g/dL (ref 6.0–8.3)

## 2014-04-03 MED ORDER — ALTEPLASE 2 MG IJ SOLR
2.0000 mg | Freq: Once | INTRAMUSCULAR | Status: DC | PRN
  Filled 2014-04-03: qty 2

## 2014-04-03 MED ORDER — SODIUM CHLORIDE 0.9 % IV SOLN
Freq: Once | INTRAVENOUS | Status: DC
Start: 2014-04-03 — End: 2014-04-03

## 2014-04-03 MED ORDER — ONDANSETRON HCL 8 MG PO TABS
8.0000 mg | ORAL_TABLET | Freq: Once | ORAL | Status: AC
  Administered 2014-04-03: 8 mg via ORAL

## 2014-04-03 MED ORDER — SODIUM CHLORIDE 0.9 % IJ SOLN
10.0000 mL | INTRAMUSCULAR | Status: DC | PRN
  Administered 2014-04-03: 10 mL
  Filled 2014-04-03: qty 10

## 2014-04-03 MED ORDER — SODIUM CHLORIDE 0.9 % IJ SOLN
3.0000 mL | Freq: Once | INTRAMUSCULAR | Status: DC | PRN
  Filled 2014-04-03: qty 10

## 2014-04-03 MED ORDER — HEPARIN SOD (PORK) LOCK FLUSH 100 UNIT/ML IV SOLN
500.0000 [IU] | Freq: Once | INTRAVENOUS | Status: AC | PRN
  Administered 2014-04-03: 500 [IU]
  Filled 2014-04-03: qty 5

## 2014-04-03 MED ORDER — BORTEZOMIB CHEMO SQ INJECTION 3.5 MG (2.5MG/ML)
1.3000 mg/m2 | Freq: Once | INTRAMUSCULAR | Status: AC
  Administered 2014-04-03: 2.75 mg via SUBCUTANEOUS
  Filled 2014-04-03: qty 2.75

## 2014-04-03 MED ORDER — ZOLEDRONIC ACID 4 MG/100ML IV SOLN
4.0000 mg | Freq: Once | INTRAVENOUS | Status: AC
  Administered 2014-04-03: 4 mg via INTRAVENOUS
  Filled 2014-04-03: qty 100

## 2014-04-03 MED ORDER — HEPARIN SOD (PORK) LOCK FLUSH 100 UNIT/ML IV SOLN
250.0000 [IU] | Freq: Once | INTRAVENOUS | Status: DC | PRN
  Filled 2014-04-03: qty 5

## 2014-04-03 MED ORDER — ONDANSETRON HCL 8 MG PO TABS
ORAL_TABLET | ORAL | Status: AC
  Filled 2014-04-03: qty 1

## 2014-04-03 NOTE — Progress Notes (Signed)
[facility]  Telephone:(336) [phone] Fax:(336) [phone]  ID: Rhae Lerner OB: 1971-06-17 MR#: [id number] GDJ#:[id number] Patient Care Team: No Pcp Per Patient as PCP - General (General Practice)  DIAGNOSIS: Kappa light chain myeloma  INTERVAL HISTORY: Mr. Ferrari is here today for follow-up and Day 1 Cycle 4 of treatment. He is doing well. He denies fever, chills, n/v, blurred vision, constipation, diarrhea, cough, rash, SOB, chest pain, palpitations, abdominal pain, problems urinating or blood in urine or stool.  He denies swelling, tenderness, numbness in his extremities. He does have some mild tingling in his feet that is tolerable and unchanged. 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 His WBC count today is 2.8. His appetite is good and he is staying hydrated. His energy level is still better and he is doing well.    CURRENT TREATMENT: Revlimid 59m po q day  Zometa 4 mg IV q. month  Velcade q 3 week dosing  REVIEW OF SYSTEMS: All other 10 point review of systems is negative.   PAST MEDICAL HISTORY: Past Medical History  Diagnosis Date  . History of radiation therapy 02/07/13- 02/26/13    lower L spine, upper sacrum, 35 gray in 14 fractions  . Cancer   . Multiple myeloma dx'd 01/2013   PAST SURGICAL HISTORY: Past Surgical History  Procedure Laterality Date  . Portacath placement    . Bone marrow transplant     FAMILY HISTORY Family History  Problem Relation Age of Onset  . Diabetic kidney disease Mother   . Hypertension Mother   . Heart attack Mother   . Kidney failure Mother   . Coronary artery disease Mother   . HIV Father    GYNECOLOGIC HISTORY:  No LMP for male patient.   SOCIAL HISTORY:  History   Social History  . Marital Status: Married    Spouse Name: N/A    Number of Children: N/A  . Years of Education: N/A   Occupational History  . Not on file.   Social History Main Topics  .  Smoking status: Former Smoker -- 1.00 packs/day for 15 years    Types: Cigarettes    Start date: 06/29/1988    Quit date: 06/29/2003  . Smokeless tobacco: Never Used     Comment: quit smoking 10 years ago  . Alcohol Use: No  . Drug Use: No  . Sexual Activity: No   Other Topics Concern  . Not on file   Social History Narrative  . No narrative on file   ADVANCED DIRECTIVES: <no information>  HEALTH MAINTENANCE: History  Substance Use Topics  . Smoking status: Former Smoker -- 1.00 packs/day for 15 years    Types: Cigarettes    Start date: 06/29/1988    Quit date: 06/29/2003  . Smokeless tobacco: Never Used     Comment: quit smoking 10 years ago  . Alcohol Use: No   Colonoscopy: PAP: Bone density: Lipid panel:  No Known Allergies  Current Outpatient Prescriptions  Medication Sig Dispense Refill  . acetaminophen (TYLENOL) 325 MG tablet Take 650 mg by mouth every 6 (six) hours as needed (pain).      .Marland Kitchenacyclovir (ZOVIRAX) 800 MG tablet Take 1 tablet (800 mg total) by mouth 2 (two) times daily.  30 tablet  0  . aspirin EC 325 MG tablet Take 1 tablet (325 mg total) by mouth daily.  100 tablet  3  .  bisacodyl (DULCOLAX) 5 MG EC tablet Take 5 mg by mouth daily as needed for moderate constipation.      . calcium-vitamin D (OSCAL WITH D) 500-200 MG-UNIT per tablet Take 1 tablet by mouth daily with breakfast.      . gabapentin (NEURONTIN) 300 MG capsule Take 1-2 capsules (300-600 mg total) by mouth 3 (three) times daily. Take 360m in the morning and afternoon and take 6025min the evening  120 capsule  3  . ibuprofen (ADVIL,MOTRIN) 200 MG tablet Take 400 mg by mouth every 6 (six) hours as needed.      . Marland Kitchenenalidomide (REVLIMID) 10 MG capsule Take 1 capsule (10 mg total) by mouth daily.  28 capsule  0  . oxyCODONE (OXY IR/ROXICODONE) 5 MG immediate release tablet Take 1 tablet (5 mg total) by mouth every 4 (four) hours as needed for severe pain.  30 tablet  0  . prochlorperazine  (COMPAZINE) 10 MG tablet Take 1 tablet (10 mg total) by mouth every 6 (six) hours as needed for nausea or vomiting.  30 tablet  1   No current facility-administered medications for this visit.   OBJECTIVE: Filed Vitals:   04/03/14 1108  BP: 125/90  Pulse: 81  Temp: 97.9 F (36.6 C)  Resp: 18   Body mass index is 30.55 kg/(m^2). ECOG FS:0 - Asymptomatic Ocular: Sclerae unicteric, pupils equal, round and reactive to light Ear-nose-throat: Oropharynx clear, dentition fair Lymphatic: No cervical or supraclavicular adenopathy Lungs no rales or rhonchi, good excursion bilaterally Heart regular rate and rhythm, no murmur appreciated Abd soft, nontender, positive bowel sounds MSK no focal spinal tenderness, no joint edema Neuro: non-focal, well-oriented, appropriate affect  LAB RESULTS: CMP     Component Value Date/Time   NA 141 03/20/2014 1050   NA 138 03/05/2014 0927   NA 145 02/21/2013 0908   K 4.1 03/20/2014 1050   K 4.1 03/05/2014 0927   K 3.8 02/21/2013 0908   CL 107 03/20/2014 1050   CL 97* 03/05/2014 0927   CO2 24 03/20/2014 1050   CO2 26 03/05/2014 0927   CO2 32* 02/21/2013 0908   GLUCOSE 97 03/20/2014 1050   GLUCOSE 100 03/05/2014 0927   GLUCOSE 115 02/21/2013 0908   BUN 13 03/20/2014 1050   BUN 23* 03/05/2014 0927   BUN 19.5 02/21/2013 0908   CREATININE 1.33 03/20/2014 1050   CREATININE 1.5* 03/05/2014 0927   CREATININE 1.5* 02/21/2013 0908   CALCIUM 8.4 03/20/2014 1050   CALCIUM 8.4 03/05/2014 0927   CALCIUM 17.7 Repeated and Verified* 02/21/2013 0908   CALCIUM >15.0* 02/01/2013 1230   PROT 7.0 03/20/2014 1050   PROT 8.0 03/05/2014 0927   ALBUMIN 4.1 03/20/2014 1050   AST 18 03/20/2014 1050   AST 24 03/05/2014 0927   ALT 14 03/20/2014 1050   ALT 19 03/05/2014 0927   ALKPHOS 46 03/20/2014 1050   ALKPHOS 61 03/05/2014 0927   BILITOT 1.8* 03/20/2014 1050   BILITOT 1.40 03/05/2014 0927   GFRNONAA 80* 02/28/2014 0500   GFRAA >90 02/28/2014 0500   No results found for this basename: SPEP,  UPEP,   kappa  and lambda light chains   Lab Results  Component Value Date   WBC 2.8* 04/03/2014   NEUTROABS 1.1* 04/03/2014   HGB 10.9* 04/03/2014   HCT 33.3* 04/03/2014   MCV 97 04/03/2014   PLT 184 04/03/2014   No results found for this basename: LABCA2   No components found with this basename: [id number]  No results found for this basename: INR,  in the last 168 hours  STUDIES: None  ASSESSMENT/PLAN:Mr. Loveland is 43 year old done. 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 This was in January or early February. He is doing much better. We will proceed with Day 1 cycle 4 of treatment today.   His CBC today looks ok. We will see what his other labs show.   He does not need any refills at this time.  He has his treatment schedule.  All questions were answered and he knows to call here with any questions or concerns. We can certainly see him sooner if need be.    [name], NP 04/03/2014 11:34 AM

## 2014-04-03 NOTE — Patient Instructions (Signed)
Bortezomib injection What is this medicine? BORTEZOMIB (bor TEZ oh mib) is a chemotherapy drug. It slows the growth of cancer cells. This medicine is used to treat multiple myeloma, and certain lymphomas, such as mantle-cell lymphoma. This medicine may be used for other purposes; ask your health care provider or pharmacist if you have questions. COMMON BRAND NAME(S): Velcade What should I tell my health care provider before I take this medicine? They need to know if you have any of these conditions: -diabetes -heart disease -irregular heartbeat -liver disease -on hemodialysis -low blood counts, like low white blood cells, platelets, or hemoglobin -peripheral neuropathy -taking medicine for blood pressure -an unusual or allergic reaction to bortezomib, mannitol, boron, other medicines, foods, dyes, or preservatives -pregnant or trying to get pregnant -breast-feeding How should I use this medicine? This medicine is for injection into a vein or for injection under the skin. It is given by a health care professional in a hospital or clinic setting. Talk to your pediatrician regarding the use of this medicine in children. Special care may be needed. Overdosage: If you think you have taken too much of this medicine contact a poison control center or emergency room at once. NOTE: This medicine is only for you. Do not share this medicine with others. What if I miss a dose? It is important not to miss your dose. Call your doctor or health care professional if you are unable to keep an appointment. What may interact with this medicine? This medicine may interact with the following medications: -ketoconazole -rifampin -ritonavir -St. John's Wort This list may not describe all possible interactions. Give your health care provider a list of all the medicines, herbs, non-prescription drugs, or dietary supplements you use. Also tell them if you smoke, drink alcohol, or use illegal drugs. Some items  may interact with your medicine. What should I watch for while using this medicine? Visit your doctor for checks on your progress. This drug may make you feel generally unwell. This is not uncommon, as chemotherapy can affect healthy cells as well as cancer cells. Report any side effects. Continue your course of treatment even though you feel ill unless your doctor tells you to stop. You may get drowsy or dizzy. Do not drive, use machinery, or do anything that needs mental alertness until you know how this medicine affects you. Do not stand or sit up quickly, especially if you are an older patient. This reduces the risk of dizzy or fainting spells. In some cases, you may be given additional medicines to help with side effects. Follow all directions for their use. Call your doctor or health care professional for advice if you get a fever, chills or sore throat, or other symptoms of a cold or flu. Do not treat yourself. This drug decreases your body's ability to fight infections. Try to avoid being around people who are sick. This medicine may increase your risk to bruise or bleed. Call your doctor or health care professional if you notice any unusual bleeding. You may need blood work done while you are taking this medicine. In some patients, this medicine may cause a serious brain infection that may cause death. If you have any problems seeing, thinking, speaking, walking, or standing, tell your doctor right away. If you cannot reach your doctor, urgently seek other source of medical care. Do not become pregnant while taking this medicine. Women should inform their doctor if they wish to become pregnant or think they might be pregnant. There is   a potential for serious side effects to an unborn child. Talk to your health care professional or pharmacist for more information. Do not breast-feed an infant while taking this medicine. Check with your doctor or health care professional if you get an attack of  severe diarrhea, nausea and vomiting, or if you sweat a lot. The loss of too much body fluid can make it dangerous for you to take this medicine. What side effects may I notice from receiving this medicine? Side effects that you should report to your doctor or health care professional as soon as possible: -allergic reactions like skin rash, itching or hives, swelling of the face, lips, or tongue -breathing problems -changes in hearing -changes in vision -fast, irregular heartbeat -feeling faint or lightheaded, falls -pain, tingling, numbness in the hands or feet -right upper belly pain -seizures -swelling of the ankles, feet, hands -unusual bleeding or bruising -unusually weak or tired -vomiting -yellowing of the eyes or skin Side effects that usually do not require medical attention (report to your doctor or health care professional if they continue or are bothersome): -changes in emotions or moods -constipation -diarrhea -loss of appetite -headache -irritation at site where injected -nausea This list may not describe all possible side effects. Call your doctor for medical advice about side effects. You may report side effects to FDA at 1-800-FDA-1088. Where should I keep my medicine? This drug is given in a hospital or clinic and will not be stored at home. NOTE: This sheet is a summary. It may not cover all possible information. If you have questions about this medicine, talk to your doctor, pharmacist, or health care provider.  2015, [employer]. (2013-05-13 12:46:32) Zoledronic Acid injection (Hypercalcemia, Oncology) What is this medicine? ZOLEDRONIC ACID (ZOE le dron ik AS id) lowers the amount of calcium loss from bone. It is used to treat too much calcium in your blood from cancer. It is also used to prevent complications of cancer that has spread to the bone. This medicine may be used for other purposes; ask your health care provider or pharmacist if you have  questions. COMMON BRAND NAME(S): Zometa What should I tell my health care provider before I take this medicine? They need to know if you have any of these conditions: -aspirin-sensitive asthma -cancer, especially if you are receiving medicines used to treat cancer -dental disease or wear dentures -infection -kidney disease -receiving corticosteroids like dexamethasone or prednisone -an unusual or allergic reaction to zoledronic acid, other medicines, foods, dyes, or preservatives -pregnant or trying to get pregnant -breast-feeding How should I use this medicine? This medicine is for infusion into a vein. It is given by a health care professional in a hospital or clinic setting. Talk to your pediatrician regarding the use of this medicine in children. Special care may be needed. Overdosage: If you think you have taken too much of this medicine contact a poison control center or emergency room at once. NOTE: This medicine is only for you. Do not share this medicine with others. What if I miss a dose? It is important not to miss your dose. Call your doctor or health care professional if you are unable to keep an appointment. What may interact with this medicine? -certain antibiotics given by injection -NSAIDs, medicines for pain and inflammation, like ibuprofen or naproxen -some diuretics like bumetanide, furosemide -teriparatide -thalidomide This list may not describe all possible interactions. Give your health care provider a list of all the medicines, herbs, non-prescription drugs, or dietary   supplements you use. Also tell them if you smoke, drink alcohol, or use illegal drugs. Some items may interact with your medicine. What should I watch for while using this medicine? Visit your doctor or health care professional for regular checkups. It may be some time before you see the benefit from this medicine. Do not stop taking your medicine unless your doctor tells you to. Your doctor may  order blood tests or other tests to see how you are doing. Women should inform their doctor if they wish to become pregnant or think they might be pregnant. There is a potential for serious side effects to an unborn child. Talk to your health care professional or pharmacist for more information. You should make sure that you get enough calcium and vitamin D while you are taking this medicine. Discuss the foods you eat and the vitamins you take with your health care professional. Some people who take this medicine have severe bone, joint, and/or muscle pain. This medicine may also increase your risk for jaw problems or a broken thigh bone. Tell your doctor right away if you have severe pain in your jaw, bones, joints, or muscles. Tell your doctor if you have any pain that does not go away or that gets worse. Tell your dentist and dental surgeon that you are taking this medicine. You should not have major dental surgery while on this medicine. See your dentist to have a dental exam and fix any dental problems before starting this medicine. Take good care of your teeth while on this medicine. Make sure you see your dentist for regular follow-up appointments. What side effects may I notice from receiving this medicine? Side effects that you should report to your doctor or health care professional as soon as possible: -allergic reactions like skin rash, itching or hives, swelling of the face, lips, or tongue -anxiety, confusion, or depression -breathing problems -changes in vision -eye pain -feeling faint or lightheaded, falls -jaw pain, especially after dental work -mouth sores -muscle cramps, stiffness, or weakness -trouble passing urine or change in the amount of urine Side effects that usually do not require medical attention (report to your doctor or health care professional if they continue or are bothersome): -bone, joint, or muscle pain -constipation -diarrhea -fever -hair loss -irritation  at site where injected -loss of appetite -nausea, vomiting -stomach upset -trouble sleeping -trouble swallowing -weak or tired This list may not describe all possible side effects. Call your doctor for medical advice about side effects. You may report side effects to FDA at 1-800-FDA-1088. Where should I keep my medicine? This drug is given in a hospital or clinic and will not be stored at home. NOTE: This sheet is a summary. It may not cover all possible information. If you have questions about this medicine, talk to your doctor, pharmacist, or health care provider.  2015, [employer]. (2012-12-27 13:03:13)  

## 2014-04-14 ENCOUNTER — Other Ambulatory Visit: Payer: Self-pay | Admitting: *Deleted

## 2014-04-14 DIAGNOSIS — C9000 Multiple myeloma not having achieved remission: Secondary | ICD-10-CM

## 2014-04-14 MED ORDER — LENALIDOMIDE 10 MG PO CAPS: 10.0000 mg | ORAL_CAPSULE | Freq: Every day | ORAL | Status: DC

## 2014-04-17 ENCOUNTER — Ambulatory Visit (HOSPITAL_BASED_OUTPATIENT_CLINIC_OR_DEPARTMENT_OTHER): Payer: BC Managed Care – PPO

## 2014-04-17 ENCOUNTER — Other Ambulatory Visit (HOSPITAL_BASED_OUTPATIENT_CLINIC_OR_DEPARTMENT_OTHER): Payer: BC Managed Care – PPO | Admitting: Lab

## 2014-04-17 ENCOUNTER — Other Ambulatory Visit: Payer: Self-pay | Admitting: *Deleted

## 2014-04-17 VITALS — BP 101/59 | HR 66 | Temp 98.3°F | Resp 18

## 2014-04-17 DIAGNOSIS — Z23 Encounter for immunization: Secondary | ICD-10-CM

## 2014-04-17 DIAGNOSIS — C9000 Multiple myeloma not having achieved remission: Secondary | ICD-10-CM

## 2014-04-17 DIAGNOSIS — Z5112 Encounter for antineoplastic immunotherapy: Secondary | ICD-10-CM

## 2014-04-17 LAB — CBC WITH DIFFERENTIAL (CANCER CENTER ONLY)
BASO#: 0 10*3/uL (ref 0.0–0.2)
BASO%: 0.5 % (ref 0.0–2.0)
EOS ABS: 0.4 10*3/uL (ref 0.0–0.5)
EOS%: 9.2 % — ABNORMAL HIGH (ref 0.0–7.0)
HCT: 33 % — ABNORMAL LOW (ref 38.7–49.9)
HEMOGLOBIN: 10.8 g/dL — AB (ref 13.0–17.1)
LYMPH#: 1.2 10*3/uL (ref 0.9–3.3)
LYMPH%: 29.2 % (ref 14.0–48.0)
MCH: 31.6 pg (ref 28.0–33.4)
MCHC: 32.7 g/dL (ref 32.0–35.9)
MCV: 97 fL (ref 82–98)
MONO#: 0.6 10*3/uL (ref 0.1–0.9)
MONO%: 15 % — AB (ref 0.0–13.0)
NEUT#: 1.9 10*3/uL (ref 1.5–6.5)
NEUT%: 46.1 % (ref 40.0–80.0)
Platelets: 176 10*3/uL (ref 145–400)
RBC: 3.42 10*6/uL — AB (ref 4.20–5.70)
RDW: 14.5 % (ref 11.1–15.7)
WBC: 4 10*3/uL (ref 4.0–10.0)

## 2014-04-17 MED ORDER — ONDANSETRON HCL 8 MG PO TABS
ORAL_TABLET | ORAL | Status: AC
  Filled 2014-04-17: qty 1

## 2014-04-17 MED ORDER — BORTEZOMIB CHEMO SQ INJECTION 3.5 MG (2.5MG/ML)
1.3000 mg/m2 | Freq: Once | INTRAMUSCULAR | Status: AC
  Administered 2014-04-17: 2.75 mg via SUBCUTANEOUS
  Filled 2014-04-17: qty 2.75

## 2014-04-17 MED ORDER — INFLUENZA VAC SPLIT QUAD 0.5 ML IM SUSY
0.5000 mL | PREFILLED_SYRINGE | Freq: Once | INTRAMUSCULAR | Status: AC
  Administered 2014-04-17: 0.5 mL via INTRAMUSCULAR
  Filled 2014-04-17: qty 0.5

## 2014-04-17 MED ORDER — ONDANSETRON HCL 8 MG PO TABS
8.0000 mg | ORAL_TABLET | Freq: Once | ORAL | Status: AC
  Administered 2014-04-17: 8 mg via ORAL

## 2014-04-17 NOTE — Patient Instructions (Signed)
Bortezomib injection What is this medicine? BORTEZOMIB (bor TEZ oh mib) is a chemotherapy drug. It slows the growth of cancer cells. This medicine is used to treat multiple myeloma, and certain lymphomas, such as mantle-cell lymphoma. This medicine may be used for other purposes; ask your health care provider or pharmacist if you have questions. COMMON BRAND NAME(S): Velcade What should I tell my health care provider before I take this medicine? They need to know if you have any of these conditions: -diabetes -heart disease -irregular heartbeat -liver disease -on hemodialysis -low blood counts, like low white blood cells, platelets, or hemoglobin -peripheral neuropathy -taking medicine for blood pressure -an unusual or allergic reaction to bortezomib, mannitol, boron, other medicines, foods, dyes, or preservatives -pregnant or trying to get pregnant -breast-feeding How should I use this medicine? This medicine is for injection into a vein or for injection under the skin. It is given by a health care professional in a hospital or clinic setting. Talk to your pediatrician regarding the use of this medicine in children. Special care may be needed. Overdosage: If you think you have taken too much of this medicine contact a poison control center or emergency room at once. NOTE: This medicine is only for you. Do not share this medicine with others. What if I miss a dose? It is important not to miss your dose. Call your doctor or health care professional if you are unable to keep an appointment. What may interact with this medicine? This medicine may interact with the following medications: -ketoconazole -rifampin -ritonavir -St. John's Wort This list may not describe all possible interactions. Give your health care provider a list of all the medicines, herbs, non-prescription drugs, or dietary supplements you use. Also tell them if you smoke, drink alcohol, or use illegal drugs. Some items  may interact with your medicine. What should I watch for while using this medicine? Visit your doctor for checks on your progress. This drug may make you feel generally unwell. This is not uncommon, as chemotherapy can affect healthy cells as well as cancer cells. Report any side effects. Continue your course of treatment even though you feel ill unless your doctor tells you to stop. You may get drowsy or dizzy. Do not drive, use machinery, or do anything that needs mental alertness until you know how this medicine affects you. Do not stand or sit up quickly, especially if you are an older patient. This reduces the risk of dizzy or fainting spells. In some cases, you may be given additional medicines to help with side effects. Follow all directions for their use. Call your doctor or health care professional for advice if you get a fever, chills or sore throat, or other symptoms of a cold or flu. Do not treat yourself. This drug decreases your body's ability to fight infections. Try to avoid being around people who are sick. This medicine may increase your risk to bruise or bleed. Call your doctor or health care professional if you notice any unusual bleeding. You may need blood work done while you are taking this medicine. In some patients, this medicine may cause a serious brain infection that may cause death. If you have any problems seeing, thinking, speaking, walking, or standing, tell your doctor right away. If you cannot reach your doctor, urgently seek other source of medical care. Do not become pregnant while taking this medicine. Women should inform their doctor if they wish to become pregnant or think they might be pregnant. There is   a potential for serious side effects to an unborn child. Talk to your health care professional or pharmacist for more information. Do not breast-feed an infant while taking this medicine. Check with your doctor or health care professional if you get an attack of  severe diarrhea, nausea and vomiting, or if you sweat a lot. The loss of too much body fluid can make it dangerous for you to take this medicine. What side effects may I notice from receiving this medicine? Side effects that you should report to your doctor or health care professional as soon as possible: -allergic reactions like skin rash, itching or hives, swelling of the face, lips, or tongue -breathing problems -changes in hearing -changes in vision -fast, irregular heartbeat -feeling faint or lightheaded, falls -pain, tingling, numbness in the hands or feet -right upper belly pain -seizures -swelling of the ankles, feet, hands -unusual bleeding or bruising -unusually weak or tired -vomiting -yellowing of the eyes or skin Side effects that usually do not require medical attention (report to your doctor or health care professional if they continue or are bothersome): -changes in emotions or moods -constipation -diarrhea -loss of appetite -headache -irritation at site where injected -nausea This list may not describe all possible side effects. Call your doctor for medical advice about side effects. You may report side effects to FDA at 1-800-FDA-1088. Where should I keep my medicine? This drug is given in a hospital or clinic and will not be stored at home. NOTE: This sheet is a summary. It may not cover all possible information. If you have questions about this medicine, talk to your doctor, pharmacist, or health care provider.  2015, [employer]vier/Gold Standard. (2013-05-13 12:46:32)  

## 2014-04-21 LAB — SPEP & IFE WITH QIG
ALBUMIN ELP: 57.2 % (ref 55.8–66.1)
Alpha-1-Globulin: 5.3 % — ABNORMAL HIGH (ref 2.9–4.9)
Alpha-2-Globulin: 9.7 % (ref 7.1–11.8)
Beta 2: 4.7 % (ref 3.2–6.5)
Beta Globulin: 5 % (ref 4.7–7.2)
Gamma Globulin: 18.1 % (ref 11.1–18.8)
IGA: 94 mg/dL (ref 68–379)
IgG (Immunoglobin G), Serum: 1310 mg/dL (ref 650–1600)
IgM, Serum: 92 mg/dL (ref 41–251)
M-Spike, %: 0.23 g/dL
TOTAL PROTEIN, SERUM ELECTROPHOR: 6.9 g/dL (ref 6.0–8.3)

## 2014-04-21 LAB — BASIC METABOLIC PANEL
BUN: 11 mg/dL (ref 6–23)
CALCIUM: 9 mg/dL (ref 8.4–10.5)
CO2: 27 meq/L (ref 19–32)
CREATININE: 1.24 mg/dL (ref 0.50–1.35)
Chloride: 102 mEq/L (ref 96–112)
GLUCOSE: 93 mg/dL (ref 70–99)
Potassium: 4.8 mEq/L (ref 3.5–5.3)
SODIUM: 136 meq/L (ref 135–145)

## 2014-04-21 LAB — LACTATE DEHYDROGENASE: LDH: 189 U/L (ref 94–250)

## 2014-04-21 LAB — KAPPA/LAMBDA LIGHT CHAINS
KAPPA FREE LGHT CHN: 4.64 mg/dL — AB (ref 0.33–1.94)
KAPPA LAMBDA RATIO: 0.95 (ref 0.26–1.65)
LAMBDA FREE LGHT CHN: 4.86 mg/dL — AB (ref 0.57–2.63)

## 2014-04-21 LAB — BETA 2 MICROGLOBULIN, SERUM: Beta-2 Microglobulin: 3.26 mg/L — ABNORMAL HIGH (ref <=2.51)

## 2014-05-01 ENCOUNTER — Other Ambulatory Visit: Payer: Self-pay | Admitting: Lab

## 2014-05-01 ENCOUNTER — Other Ambulatory Visit: Payer: Self-pay

## 2014-05-01 ENCOUNTER — Ambulatory Visit (HOSPITAL_BASED_OUTPATIENT_CLINIC_OR_DEPARTMENT_OTHER): Payer: BC Managed Care – PPO

## 2014-05-01 ENCOUNTER — Ambulatory Visit: Payer: Self-pay

## 2014-05-01 ENCOUNTER — Ambulatory Visit (HOSPITAL_BASED_OUTPATIENT_CLINIC_OR_DEPARTMENT_OTHER)
Admission: RE | Admit: 2014-05-01 | Discharge: 2014-05-01 | Disposition: A | Discharge status: Home / Self Care | Payer: BC Managed Care – PPO | Source: Ambulatory Visit | Attending: Hematology & Oncology | Admitting: Hematology & Oncology

## 2014-05-01 ENCOUNTER — Other Ambulatory Visit (HOSPITAL_BASED_OUTPATIENT_CLINIC_OR_DEPARTMENT_OTHER): Payer: BC Managed Care – PPO | Admitting: Lab

## 2014-05-01 VITALS — BP 118/62 | HR 75 | Temp 98.6°F | Resp 16

## 2014-05-01 DIAGNOSIS — Z5112 Encounter for antineoplastic immunotherapy: Secondary | ICD-10-CM

## 2014-05-01 DIAGNOSIS — R05 Cough: Secondary | ICD-10-CM | POA: Diagnosis not present

## 2014-05-01 DIAGNOSIS — J069 Acute upper respiratory infection, unspecified: Secondary | ICD-10-CM

## 2014-05-01 DIAGNOSIS — C9000 Multiple myeloma not having achieved remission: Secondary | ICD-10-CM

## 2014-05-01 LAB — CBC WITH DIFFERENTIAL (CANCER CENTER ONLY)
BASO#: 0 10*3/uL (ref 0.0–0.2)
BASO%: 0.3 % (ref 0.0–2.0)
EOS%: 6.5 % (ref 0.0–7.0)
Eosinophils Absolute: 0.2 10*3/uL (ref 0.0–0.5)
HCT: 28.8 % — ABNORMAL LOW (ref 38.7–49.9)
HEMOGLOBIN: 9.2 g/dL — AB (ref 13.0–17.1)
LYMPH#: 1.5 10*3/uL (ref 0.9–3.3)
LYMPH%: 41 % (ref 14.0–48.0)
MCH: 31 pg (ref 28.0–33.4)
MCHC: 31.9 g/dL — ABNORMAL LOW (ref 32.0–35.9)
MCV: 97 fL (ref 82–98)
MONO#: 0.5 10*3/uL (ref 0.1–0.9)
MONO%: 13.2 % — AB (ref 0.0–13.0)
NEUT#: 1.4 10*3/uL — ABNORMAL LOW (ref 1.5–6.5)
NEUT%: 39 % — ABNORMAL LOW (ref 40.0–80.0)
Platelets: 152 10*3/uL (ref 145–400)
RBC: 2.97 10*6/uL — AB (ref 4.20–5.70)
RDW: 14.5 % (ref 11.1–15.7)
WBC: 3.6 10*3/uL — AB (ref 4.0–10.0)

## 2014-05-01 LAB — BASIC METABOLIC PANEL - CANCER CENTER ONLY
BUN: 9 mg/dL (ref 7–22)
CO2: 24 meq/L (ref 18–33)
Calcium: 8.4 mg/dL (ref 8.0–10.3)
Chloride: 103 mEq/L (ref 98–108)
Creat: 0.9 mg/dl (ref 0.6–1.2)
GLUCOSE: 119 mg/dL — AB (ref 73–118)
POTASSIUM: 3.8 meq/L (ref 3.3–4.7)
Sodium: 142 mEq/L (ref 128–145)

## 2014-05-01 MED ORDER — ONDANSETRON HCL 8 MG PO TABS
ORAL_TABLET | ORAL | Status: AC
  Filled 2014-05-01: qty 1

## 2014-05-01 MED ORDER — SODIUM CHLORIDE 0.9 % IJ SOLN
10.0000 mL | INTRAMUSCULAR | Status: DC | PRN
  Filled 2014-05-01: qty 10

## 2014-05-01 MED ORDER — ONDANSETRON HCL 8 MG PO TABS
8.0000 mg | ORAL_TABLET | Freq: Once | ORAL | Status: AC
Start: 2014-05-01 — End: 2014-05-01
  Administered 2014-05-01: 8 mg via ORAL

## 2014-05-01 MED ORDER — ZOLEDRONIC ACID 4 MG/100ML IV SOLN
4.0000 mg | Freq: Once | INTRAVENOUS | Status: AC
  Administered 2014-05-01: 4 mg via INTRAVENOUS
  Filled 2014-05-01: qty 100

## 2014-05-01 MED ORDER — BORTEZOMIB CHEMO SQ INJECTION 3.5 MG (2.5MG/ML)
1.3000 mg/m2 | Freq: Once | INTRAMUSCULAR | Status: AC
  Administered 2014-05-01: 2.75 mg via SUBCUTANEOUS
  Filled 2014-05-01: qty 2.75

## 2014-05-01 MED ORDER — AZITHROMYCIN 250 MG PO TABS: ORAL_TABLET | ORAL | Status: DC

## 2014-05-01 MED ORDER — HEPARIN SOD (PORK) LOCK FLUSH 100 UNIT/ML IV SOLN
250.0000 [IU] | Freq: Once | INTRAVENOUS | Status: DC | PRN
  Filled 2014-05-01: qty 5

## 2014-05-01 NOTE — Progress Notes (Signed)
Pt reports fevers up to 100.4 x 3 days with cough and slight dyspnea. Reports green to yellow sputum. Is taking OTC cough and cold meds without relief. Afebrile at present. Per dr Ginette Pitman, pt for CXR. dph

## 2014-05-01 NOTE — Patient Instructions (Signed)
Azithromycin tablets What is this medicine? AZITHROMYCIN (az ith roe MYE sin) is a macrolide antibiotic. It is used to treat or prevent certain kinds of bacterial infections. It will not work for colds, flu, or other viral infections. This medicine may be used for other purposes; ask your health care provider or pharmacist if you have questions. COMMON BRAND NAME(S): Zithromax, Zithromax Tri-Pak, Zithromax Z-Pak What should I tell my health care provider before I take this medicine? They need to know if you have any of these conditions: -kidney disease -liver disease -irregular heartbeat or heart disease -an unusual or allergic reaction to azithromycin, erythromycin, other macrolide antibiotics, foods, dyes, or preservatives -pregnant or trying to get pregnant -breast-feeding How should I use this medicine? Take this medicine by mouth with a full glass of water. Follow the directions on the prescription label. The tablets can be taken with food or on an empty stomach. If the medicine upsets your stomach, take it with food. Take your medicine at regular intervals. Do not take your medicine more often than directed. Take all of your medicine as directed even if you think your are better. Do not skip doses or stop your medicine early. Talk to your pediatrician regarding the use of this medicine in children. Special care may be needed. Overdosage: If you think you have taken too much of this medicine contact a poison control center or emergency room at once. NOTE: This medicine is only for you. Do not share this medicine with others. What if I miss a dose? If you miss a dose, take it as soon as you can. If it is almost time for your next dose, take only that dose. Do not take double or extra doses. What may interact with this medicine? Do not take this medicine with any of the following medications: -lincomycin This medicine may also interact with the following  medications: -amiodarone -antacids -birth control pills -cyclosporine -digoxin -magnesium -nelfinavir -phenytoin -warfarin This list may not describe all possible interactions. Give your health care provider a list of all the medicines, herbs, non-prescription drugs, or dietary supplements you use. Also tell them if you smoke, drink alcohol, or use illegal drugs. Some items may interact with your medicine. What should I watch for while using this medicine? Tell your doctor or health care professional if your symptoms do not improve. Do not treat diarrhea with over the counter products. Contact your doctor if you have diarrhea that lasts more than 2 days or if it is severe and watery. This medicine can make you more sensitive to the sun. Keep out of the sun. If you cannot avoid being in the sun, wear protective clothing and use sunscreen. Do not use sun lamps or tanning beds/booths. What side effects may I notice from receiving this medicine? Side effects that you should report to your doctor or health care professional as soon as possible: -allergic reactions like skin rash, itching or hives, swelling of the face, lips, or tongue -confusion, nightmares or hallucinations -dark urine -difficulty breathing -hearing loss -irregular heartbeat or chest pain -pain or difficulty passing urine -redness, blistering, peeling or loosening of the skin, including inside the mouth -white patches or sores in the mouth -yellowing of the eyes or skin Side effects that usually do not require medical attention (report to your doctor or health care professional if they continue or are bothersome): -diarrhea -dizziness, drowsiness -headache -stomach upset or vomiting -tooth discoloration -vaginal irritation This list may not describe all possible side effects.  Call your doctor for medical advice about side effects. You may report side effects to FDA at 1-800-FDA-1088. Where should I keep my  medicine? Keep out of the reach of children. Store at room temperature between 15 and 30 degrees C (59 and 86 degrees F). Throw away any unused medicine after the expiration date. NOTE: This sheet is a summary. It may not cover all possible information. If you have questions about this medicine, talk to your doctor, pharmacist, or health care provider.  2015, [employer]. (2013-02-21 15:38:48) Bortezomib injection What is this medicine? BORTEZOMIB (bor TEZ oh mib) is a chemotherapy drug. It slows the growth of cancer cells. This medicine is used to treat multiple myeloma, and certain lymphomas, such as mantle-cell lymphoma. This medicine may be used for other purposes; ask your health care provider or pharmacist if you have questions. COMMON BRAND NAME(S): Velcade What should I tell my health care provider before I take this medicine? They need to know if you have any of these conditions: -diabetes -heart disease -irregular heartbeat -liver disease -on hemodialysis -low blood counts, like low white blood cells, platelets, or hemoglobin -peripheral neuropathy -taking medicine for blood pressure -an unusual or allergic reaction to bortezomib, mannitol, boron, other medicines, foods, dyes, or preservatives -pregnant or trying to get pregnant -breast-feeding How should I use this medicine? This medicine is for injection into a vein or for injection under the skin. It is given by a health care professional in a hospital or clinic setting. Talk to your pediatrician regarding the use of this medicine in children. Special care may be needed. Overdosage: If you think you have taken too much of this medicine contact a poison control center or emergency room at once. NOTE: This medicine is only for you. Do not share this medicine with others. What if I miss a dose? It is important not to miss your dose. Call your doctor or health care professional if you are unable to keep an  appointment. What may interact with this medicine? This medicine may interact with the following medications: -ketoconazole -rifampin -ritonavir -St. John's Wort This list may not describe all possible interactions. Give your health care provider a list of all the medicines, herbs, non-prescription drugs, or dietary supplements you use. Also tell them if you smoke, drink alcohol, or use illegal drugs. Some items may interact with your medicine. What should I watch for while using this medicine? Visit your doctor for checks on your progress. This drug may make you feel generally unwell. This is not uncommon, as chemotherapy can affect healthy cells as well as cancer cells. Report any side effects. Continue your course of treatment even though you feel ill unless your doctor tells you to stop. You may get drowsy or dizzy. Do not drive, use machinery, or do anything that needs mental alertness until you know how this medicine affects you. Do not stand or sit up quickly, especially if you are an older patient. This reduces the risk of dizzy or fainting spells. In some cases, you may be given additional medicines to help with side effects. Follow all directions for their use. Call your doctor or health care professional for advice if you get a fever, chills or sore throat, or other symptoms of a cold or flu. Do not treat yourself. This drug decreases your body's ability to fight infections. Try to avoid being around people who are sick. This medicine may increase your risk to bruise or bleed. Call your doctor or health  care professional if you notice any unusual bleeding. You may need blood work done while you are taking this medicine. In some patients, this medicine may cause a serious brain infection that may cause death. If you have any problems seeing, thinking, speaking, walking, or standing, tell your doctor right away. If you cannot reach your doctor, urgently seek other source of medical care. Do  not become pregnant while taking this medicine. Women should inform their doctor if they wish to become pregnant or think they might be pregnant. There is a potential for serious side effects to an unborn child. Talk to your health care professional or pharmacist for more information. Do not breast-feed an infant while taking this medicine. Check with your doctor or health care professional if you get an attack of severe diarrhea, nausea and vomiting, or if you sweat a lot. The loss of too much body fluid can make it dangerous for you to take this medicine. What side effects may I notice from receiving this medicine? Side effects that you should report to your doctor or health care professional as soon as possible: -allergic reactions like skin rash, itching or hives, swelling of the face, lips, or tongue -breathing problems -changes in hearing -changes in vision -fast, irregular heartbeat -feeling faint or lightheaded, falls -pain, tingling, numbness in the hands or feet -right upper belly pain -seizures -swelling of the ankles, feet, hands -unusual bleeding or bruising -unusually weak or tired -vomiting -yellowing of the eyes or skin Side effects that usually do not require medical attention (report to your doctor or health care professional if they continue or are bothersome): -changes in emotions or moods -constipation -diarrhea -loss of appetite -headache -irritation at site where injected -nausea This list may not describe all possible side effects. Call your doctor for medical advice about side effects. You may report side effects to FDA at 1-800-FDA-1088. Where should I keep my medicine? This drug is given in a hospital or clinic and will not be stored at home. NOTE: This sheet is a summary. It may not cover all possible information. If you have questions about this medicine, talk to your doctor, pharmacist, or health care provider.  2015, [employer] Standard. (2013-05-13  12:46:32) Zoledronic Acid injection (Hypercalcemia, Oncology) What is this medicine? ZOLEDRONIC ACID (ZOE le dron ik AS id) lowers the amount of calcium loss from bone. It is used to treat too much calcium in your blood from cancer. It is also used to prevent complications of cancer that has spread to the bone. This medicine may be used for other purposes; ask your health care provider or pharmacist if you have questions. COMMON BRAND NAME(S): Zometa What should I tell my health care provider before I take this medicine? They need to know if you have any of these conditions: -aspirin-sensitive asthma -cancer, especially if you are receiving medicines used to treat cancer -dental disease or wear dentures -infection -kidney disease -receiving corticosteroids like dexamethasone or prednisone -an unusual or allergic reaction to zoledronic acid, other medicines, foods, dyes, or preservatives -pregnant or trying to get pregnant -breast-feeding How should I use this medicine? This medicine is for infusion into a vein. It is given by a health care professional in a hospital or clinic setting. Talk to your pediatrician regarding the use of this medicine in children. Special care may be needed. Overdosage: If you think you have taken too much of this medicine contact a poison control center or emergency room at once. NOTE: This medicine is  only for you. Do not share this medicine with others. What if I miss a dose? It is important not to miss your dose. Call your doctor or health care professional if you are unable to keep an appointment. What may interact with this medicine? -certain antibiotics given by injection -NSAIDs, medicines for pain and inflammation, like ibuprofen or naproxen -some diuretics like bumetanide, furosemide -teriparatide -thalidomide This list may not describe all possible interactions. Give your health care provider a list of all the medicines, herbs, non-prescription  drugs, or dietary supplements you use. Also tell them if you smoke, drink alcohol, or use illegal drugs. Some items may interact with your medicine. What should I watch for while using this medicine? Visit your doctor or health care professional for regular checkups. It may be some time before you see the benefit from this medicine. Do not stop taking your medicine unless your doctor tells you to. Your doctor may order blood tests or other tests to see how you are doing. Women should inform their doctor if they wish to become pregnant or think they might be pregnant. There is a potential for serious side effects to an unborn child. Talk to your health care professional or pharmacist for more information. You should make sure that you get enough calcium and vitamin D while you are taking this medicine. Discuss the foods you eat and the vitamins you take with your health care professional. Some people who take this medicine have severe bone, joint, and/or muscle pain. This medicine may also increase your risk for jaw problems or a broken thigh bone. Tell your doctor right away if you have severe pain in your jaw, bones, joints, or muscles. Tell your doctor if you have any pain that does not go away or that gets worse. Tell your dentist and dental surgeon that you are taking this medicine. You should not have major dental surgery while on this medicine. See your dentist to have a dental exam and fix any dental problems before starting this medicine. Take good care of your teeth while on this medicine. Make sure you see your dentist for regular follow-up appointments. What side effects may I notice from receiving this medicine? Side effects that you should report to your doctor or health care professional as soon as possible: -allergic reactions like skin rash, itching or hives, swelling of the face, lips, or tongue -anxiety, confusion, or depression -breathing problems -changes in vision -eye  pain -feeling faint or lightheaded, falls -jaw pain, especially after dental work -mouth sores -muscle cramps, stiffness, or weakness -trouble passing urine or change in the amount of urine Side effects that usually do not require medical attention (report to your doctor or health care professional if they continue or are bothersome): -bone, joint, or muscle pain -constipation -diarrhea -fever -hair loss -irritation at site where injected -loss of appetite -nausea, vomiting -stomach upset -trouble sleeping -trouble swallowing -weak or tired This list may not describe all possible side effects. Call your doctor for medical advice about side effects. You may report side effects to FDA at 1-800-FDA-1088. Where should I keep my medicine? This drug is given in a hospital or clinic and will not be stored at home. NOTE: This sheet is a summary. It may not cover all possible information. If you have questions about this medicine, talk to your doctor, pharmacist, or health care provider.  2015, [employer]. (2012-12-27 13:03:13)

## 2014-05-02 ENCOUNTER — Telehealth: Payer: Self-pay | Admitting: Hematology & Oncology

## 2014-05-02 ENCOUNTER — Other Ambulatory Visit: Payer: Self-pay | Admitting: *Deleted

## 2014-05-02 DIAGNOSIS — C9000 Multiple myeloma not having achieved remission: Secondary | ICD-10-CM

## 2014-05-02 NOTE — Telephone Encounter (Signed)
Pt wants to move 11-4. I left message to call, I dont have anything else

## 2014-05-07 ENCOUNTER — Other Ambulatory Visit: Payer: Self-pay | Admitting: *Deleted

## 2014-05-07 ENCOUNTER — Other Ambulatory Visit (HOSPITAL_BASED_OUTPATIENT_CLINIC_OR_DEPARTMENT_OTHER): Payer: BC Managed Care – PPO | Admitting: Lab

## 2014-05-07 ENCOUNTER — Ambulatory Visit (HOSPITAL_BASED_OUTPATIENT_CLINIC_OR_DEPARTMENT_OTHER)
Admission: RE | Admit: 2014-05-07 | Discharge: 2014-05-07 | Disposition: A | Discharge status: Home / Self Care | Payer: BC Managed Care – PPO | Source: Ambulatory Visit | Attending: Hematology & Oncology | Admitting: Hematology & Oncology

## 2014-05-07 ENCOUNTER — Ambulatory Visit (HOSPITAL_BASED_OUTPATIENT_CLINIC_OR_DEPARTMENT_OTHER): Payer: BC Managed Care – PPO

## 2014-05-07 ENCOUNTER — Ambulatory Visit (HOSPITAL_BASED_OUTPATIENT_CLINIC_OR_DEPARTMENT_OTHER): Payer: BC Managed Care – PPO | Admitting: Family

## 2014-05-07 VITALS — BP 126/78 | HR 80 | Temp 98.5°F | Resp 16 | Wt 199.0 lb

## 2014-05-07 DIAGNOSIS — R05 Cough: Secondary | ICD-10-CM | POA: Diagnosis not present

## 2014-05-07 DIAGNOSIS — C9000 Multiple myeloma not having achieved remission: Secondary | ICD-10-CM

## 2014-05-07 DIAGNOSIS — M545 Low back pain, unspecified: Secondary | ICD-10-CM

## 2014-05-07 DIAGNOSIS — R509 Fever, unspecified: Secondary | ICD-10-CM | POA: Diagnosis not present

## 2014-05-07 DIAGNOSIS — R5383 Other fatigue: Secondary | ICD-10-CM | POA: Diagnosis not present

## 2014-05-07 DIAGNOSIS — G8929 Other chronic pain: Secondary | ICD-10-CM

## 2014-05-07 DIAGNOSIS — J111 Influenza due to unidentified influenza virus with other respiratory manifestations: Secondary | ICD-10-CM

## 2014-05-07 LAB — CBC WITH DIFFERENTIAL (CANCER CENTER ONLY)
BASO#: 0 10*3/uL (ref 0.0–0.2)
BASO%: 0 % (ref 0.0–2.0)
EOS%: 6.4 % (ref 0.0–7.0)
Eosinophils Absolute: 0.1 10*3/uL (ref 0.0–0.5)
HCT: 31.4 % — ABNORMAL LOW (ref 38.7–49.9)
HGB: 10.3 g/dL — ABNORMAL LOW (ref 13.0–17.1)
LYMPH#: 0.8 10*3/uL — ABNORMAL LOW (ref 0.9–3.3)
LYMPH%: 37.3 % (ref 14.0–48.0)
MCH: 31.2 pg (ref 28.0–33.4)
MCHC: 32.8 g/dL (ref 32.0–35.9)
MCV: 95 fL (ref 82–98)
MONO#: 0.2 10*3/uL (ref 0.1–0.9)
MONO%: 9.5 % (ref 0.0–13.0)
NEUT%: 46.8 % (ref 40.0–80.0)
NEUTROS ABS: 1 10*3/uL — AB (ref 1.5–6.5)
PLATELETS: 115 10*3/uL — AB (ref 145–400)
RBC: 3.3 10*6/uL — ABNORMAL LOW (ref 4.20–5.70)
RDW: 14.4 % (ref 11.1–15.7)
WBC: 2.2 10*3/uL — AB (ref 4.0–10.0)

## 2014-05-07 LAB — CMP (CANCER CENTER ONLY)
ALBUMIN: 4 g/dL (ref 3.3–5.5)
ALT: 19 U/L (ref 10–47)
AST: 21 U/L (ref 11–38)
Alkaline Phosphatase: 35 U/L (ref 26–84)
BILIRUBIN TOTAL: 1.3 mg/dL (ref 0.20–1.60)
BUN, Bld: 10 mg/dL (ref 7–22)
CALCIUM: 10.2 mg/dL (ref 8.0–10.3)
CHLORIDE: 102 meq/L (ref 98–108)
CO2: 27 meq/L (ref 18–33)
Creat: 1 mg/dl (ref 0.6–1.2)
Glucose, Bld: 88 mg/dL (ref 73–118)
POTASSIUM: 3.6 meq/L (ref 3.3–4.7)
SODIUM: 141 meq/L (ref 128–145)
TOTAL PROTEIN: 7.6 g/dL (ref 6.4–8.1)

## 2014-05-07 MED ORDER — MORPHINE SULFATE 4 MG/ML IJ SOLN
INTRAMUSCULAR | Status: AC
Start: 2014-05-07 — End: 2014-05-07
  Filled 2014-05-07: qty 1

## 2014-05-07 MED ORDER — HYDROCODONE-HOMATROPINE 5-1.5 MG/5ML PO SYRP: 5.0000 mL | ORAL_SOLUTION | Freq: Four times a day (QID) | ORAL | Status: DC | PRN

## 2014-05-07 MED ORDER — TRAMADOL HCL 50 MG PO TABS: ORAL_TABLET | ORAL | Status: DC

## 2014-05-07 MED ORDER — OSELTAMIVIR PHOSPHATE 75 MG PO CAPS: 75.0000 mg | ORAL_CAPSULE | Freq: Two times a day (BID) | ORAL | Status: DC

## 2014-05-07 MED ORDER — KETOROLAC TROMETHAMINE 15 MG/ML IJ SOLN
INTRAMUSCULAR | Status: AC
  Filled 2014-05-07: qty 2

## 2014-05-07 MED ORDER — KETOROLAC TROMETHAMINE 30 MG/ML IJ SOLN
30.0000 mg | Freq: Once | INTRAMUSCULAR | Status: DC
  Administered 2014-05-07: 30 mg via INTRAVENOUS
  Filled 2014-05-07: qty 1

## 2014-05-07 MED ORDER — SODIUM CHLORIDE 0.9 % IJ SOLN
10.0000 mL | INTRAMUSCULAR | Status: DC | PRN
  Administered 2014-05-07: 10 mL via INTRAVENOUS
  Filled 2014-05-07: qty 10

## 2014-05-07 MED ORDER — MORPHINE SULFATE 4 MG/ML IJ SOLN
4.0000 mg | Freq: Once | INTRAMUSCULAR | Status: AC
  Administered 2014-05-07: 4 mg via INTRAVENOUS

## 2014-05-07 MED ORDER — MORPHINE SULFATE 4 MG/ML IJ SOLN
INTRAMUSCULAR | Status: AC
  Filled 2014-05-07: qty 1

## 2014-05-07 MED ORDER — HEPARIN SOD (PORK) LOCK FLUSH 100 UNIT/ML IV SOLN
500.0000 [IU] | Freq: Once | INTRAVENOUS | Status: AC
  Administered 2014-05-07: 500 [IU] via INTRAVENOUS
  Filled 2014-05-07: qty 5

## 2014-05-07 MED ORDER — SODIUM CHLORIDE 0.9 % IV SOLN
Freq: Once | INTRAVENOUS | Status: DC
Start: 2014-05-07 — End: 2014-05-07
  Administered 2014-05-07: 15:00:00 via INTRAVENOUS

## 2014-05-07 NOTE — Patient Instructions (Signed)
Dehydration, Adult Dehydration is when you lose more fluids from the body than you take in. Vital organs like the kidneys, brain, and heart cannot function without a proper amount of fluids and salt. Any loss of fluids from the body can cause dehydration.  CAUSES   Vomiting.  Diarrhea.  Excessive sweating.  Excessive urine output.  Fever. SYMPTOMS  Mild dehydration  Thirst.  Dry lips.  Slightly dry mouth. Moderate dehydration  Very dry mouth.  Sunken eyes.  Skin does not bounce back quickly when lightly pinched and released.  Dark urine and decreased urine production.  Decreased tear production.  Headache. Severe dehydration  Very dry mouth.  Extreme thirst.  Rapid, weak pulse (more than 100 beats per minute at rest).  Cold hands and feet.  Not able to sweat in spite of heat and temperature.  Rapid breathing.  Blue lips.  Confusion and lethargy.  Difficulty being awakened.  Minimal urine production.  No tears. DIAGNOSIS  Your caregiver will diagnose dehydration based on your symptoms and your exam. Blood and urine tests will help confirm the diagnosis. The diagnostic evaluation should also identify the cause of dehydration. TREATMENT  Treatment of mild or moderate dehydration can often be done at home by increasing the amount of fluids that you drink. It is best to drink small amounts of fluid more often. Drinking too much at one time can make vomiting worse. Refer to the home care instructions below. Severe dehydration needs to be treated at the hospital where you will probably be given intravenous (IV) fluids that contain water and electrolytes. HOME CARE INSTRUCTIONS   Ask your caregiver about specific rehydration instructions.  Drink enough fluids to keep your urine clear or pale yellow.  Drink small amounts frequently if you have nausea and vomiting.  Eat as you normally do.  Avoid:  Foods or drinks high in sugar.  Carbonated  drinks.  Juice.  Extremely hot or cold fluids.  Drinks with caffeine.  Fatty, greasy foods.  Alcohol.  Tobacco.  Overeating.  Gelatin desserts.  Wash your hands well to avoid spreading bacteria and viruses.  Only take over-the-counter or prescription medicines for pain, discomfort, or fever as directed by your caregiver.  Ask your caregiver if you should continue all prescribed and over-the-counter medicines.  Keep all follow-up appointments with your caregiver. SEEK MEDICAL CARE IF:  You have abdominal pain and it increases or stays in one area (localizes).  You have a rash, stiff neck, or severe headache.  You are irritable, sleepy, or difficult to awaken.  You are weak, dizzy, or extremely thirsty. SEEK IMMEDIATE MEDICAL CARE IF:   You are unable to keep fluids down or you get worse despite treatment.  You have frequent episodes of vomiting or diarrhea.  You have blood or green matter (bile) in your vomit.  You have blood in your stool or your stool looks black and tarry.  You have not urinated in 6 to 8 hours, or you have only urinated a small amount of very dark urine.  You have a fever.  You faint. MAKE SURE YOU:   Understand these instructions.  Will watch your condition.  Will get help right away if you are not doing well or get worse. Document Released: 07/18/2005 Document Revised: 10/10/2011 Document Reviewed: 03/07/2011 [employer] Patient Information 2015 [employer]. This information is not intended to replace advice given to you by your health care provider. Make sure you discuss any questions you have with your health care   provider.  

## 2014-05-07 NOTE — Progress Notes (Signed)
[facility]  [phone] [phone]  ID: Mark [name] OB: 01/29/1971 MR#: [id number] Patient Care Team: No Pcp Per Patient as PCP - General (General Practice)  DIAGNOSIS: Kappa light chain myeloma  INTERVAL HISTORY: Mark [name] is here today with c/o worsening infection. He started feeling bacd last week after his treatment. He was started on a z-pack. He started feeling worse on Sunday. He states that he had a fever at home. He is 98.5 now. He also c/o sore throat, chills/sweats, headache, productive cough with brown/green phlegm, upset stomach without diarrhea, and body aches. He denies  n/v, blurred vision, constipation, diarrhea, rash, SOB, chest pain, palpitations, abdominal pain, problems urinating or blood in urine or stool. He denies swelling, tenderness, numbness in his extremities. He does have some mild tingling in his feet that is tolerable and unchanged. He has a lot of back and hip issues and has chronic pain with this. He would like something different for pain because the ocycodone makes him have an upset stomach. His WBC count today is 2.2.   CURRENT THERAPY: Revlimid 79m po q day  Zometa 4 mg IV q. month  Velcade q 3 week dosing  REVIEW OF SYSTEMS: All other 10 point review of systems is negative except for those issues mentioned above.   PAST MEDICAL HISTORY: Past Medical History  Diagnosis Date  . History of radiation therapy 02/07/13- 02/26/13    lower L spine, upper sacrum, 35 gray in 14 fractions  . Cancer   . Multiple myeloma dx'd 01/2013   PAST SURGICAL HISTORY: Past Surgical History  Procedure Laterality Date  . Portacath placement    . Bone marrow transplant     FAMILY HISTORY Family History  Problem Relation Age of Onset  . Diabetic kidney disease Mother   . Hypertension Mother   . Heart attack Mother   . Kidney failure Mother   . Coronary artery disease Mother   . HIV Father    GYNECOLOGIC HISTORY:  No  LMP for male patient.   SOCIAL HISTORY:  History   Social History  . Marital Status: Married    Spouse Name: N/A    Number of Children: N/A  . Years of Education: N/A   Occupational History  . Not on file.   Social History Main Topics  . Smoking status: Former Smoker -- 1.00 packs/day for 15 years    Types: Cigarettes    Start date: 06/29/1988    Quit date: 06/29/2003  . Smokeless tobacco: Never Used     Comment: quit smoking 10 years ago  . Alcohol Use: No  . Drug Use: No  . Sexual Activity: No   Other Topics Concern  . Not on file   Social History Narrative  . No narrative on file   ADVANCED DIRECTIVES: <no information>  HEALTH MAINTENANCE: History  Substance Use Topics  . Smoking status: Former Smoker -- 1.00 packs/day for 15 years    Types: Cigarettes    Start date: 06/29/1988    Quit date: 06/29/2003  . Smokeless tobacco: Never Used     Comment: quit smoking 10 years ago  . Alcohol Use: No   Colonoscopy: PAP: Bone density: Lipid panel:  No Known Allergies  Current Outpatient Prescriptions  Medication Sig Dispense Refill  . acetaminophen (TYLENOL) 325 MG tablet Take 650 mg by mouth every 6 (six) hours as needed (pain).      .Marland Kitchenacyclovir (ZOVIRAX) 800 MG tablet Take 1 tablet (800  mg total) by mouth 2 (two) times daily.  30 tablet  0  . aspirin EC 325 MG tablet Take 1 tablet (325 mg total) by mouth daily.  100 tablet  3  . bisacodyl (DULCOLAX) 5 MG EC tablet Take 5 mg by mouth daily as needed for moderate constipation.      . calcium-vitamin D (OSCAL WITH D) 500-200 MG-UNIT per tablet Take 1 tablet by mouth daily with breakfast.      . gabapentin (NEURONTIN) 300 MG capsule Take 1-2 capsules (300-600 mg total) by mouth 3 (three) times daily. Take $RemoveBef'300mg'llyeOPKfLK$  in the morning and afternoon and take $Remove'600mg'zTEDJGx$  in the evening  120 capsule  3  . ibuprofen (ADVIL,MOTRIN) 200 MG tablet Take 400 mg by mouth every 6 (six) hours as needed.      Marland Kitchen lenalidomide (REVLIMID) 10 MG  capsule Take 1 capsule (10 mg total) by mouth daily. Auth # [id number]  28 capsule  0  . oseltamivir (TAMIFLU) 75 MG capsule Take 1 capsule (75 mg total) by mouth 2 (two) times daily.  10 capsule  0  . oxyCODONE (OXY IR/ROXICODONE) 5 MG immediate release tablet Take 1 tablet (5 mg total) by mouth every 4 (four) hours as needed for severe pain.  30 tablet  0  . prochlorperazine (COMPAZINE) 10 MG tablet Take 1 tablet (10 mg total) by mouth every 6 (six) hours as needed for nausea or vomiting.  30 tablet  1   Current Facility-Administered Medications  Medication Dose Route Frequency Provider Last Rate Last Dose  . 0.9 %  sodium chloride infusion   Intravenous Once [name], MD      . morphine 4 MG/ML injection 4 mg  4 mg Intravenous Once [name], MD       OBJECTIVE: Filed Vitals:   05/07/14 1343  BP: 126/78  Pulse: 80  Temp: 98.5 F (36.9 C)  Resp: 16   Body mass index is 29.37 kg/(m^2). ECOG FS:1 - Symptomatic but completely ambulatory Ocular: Sclerae unicteric, pupils equal, round and reactive to light Ear-nose-throat: Oropharynx clear, dentition fair Lymphatic: No cervical or supraclavicular adenopathy Lungs no rales or rhonchi, good excursion bilaterally Heart regular rate and rhythm, no murmur appreciated Abd soft, nontender, positive bowel sounds MSK no focal spinal tenderness, no joint edema Neuro: non-focal, well-oriented, appropriate affect  LAB RESULTS: CMP     Component Value Date/Time   NA 141 05/07/2014 1329   NA 136 04/17/2014 1051   NA 145 02/21/2013 0908   K 3.6 05/07/2014 1329   K 4.8 04/17/2014 1051   K 3.8 02/21/2013 0908   CL 102 05/07/2014 1329   CL 102 04/17/2014 1051   CO2 27 05/07/2014 1329   CO2 27 04/17/2014 1051   CO2 32* 02/21/2013 0908   GLUCOSE 88 05/07/2014 1329   GLUCOSE 93 04/17/2014 1051   GLUCOSE 115 02/21/2013 0908   BUN 10 05/07/2014 1329   BUN 11 04/17/2014 1051   BUN 19.5 02/21/2013 0908   CREATININE 1.0 05/07/2014 1329   CREATININE 1.24  04/17/2014 1051   CREATININE 1.5* 02/21/2013 0908   CALCIUM 10.2 05/07/2014 1329   CALCIUM 9.0 04/17/2014 1051   CALCIUM 17.7 Repeated and Verified* 02/21/2013 0908   CALCIUM >15.0* 02/01/2013 1230   PROT 7.6 05/07/2014 1329   PROT 6.9 04/03/2014 1008   ALBUMIN 4.3 04/03/2014 1008   AST 21 05/07/2014 1329   AST 22 04/03/2014 1008   ALT 19 05/07/2014 1329   ALT 16  04/03/2014 1008   ALKPHOS 35 05/07/2014 1329   ALKPHOS 48 04/03/2014 1008   BILITOT 1.30 05/07/2014 1329   BILITOT 2.0* 04/03/2014 1008   GFRNONAA 80* 02/28/2014 0500   GFRAA >90 02/28/2014 0500   No results found for this basename: SPEP, UPEP,  kappa and lambda light chains   Lab Results  Component Value Date   WBC 2.2* 05/07/2014   NEUTROABS 1.0* 05/07/2014   HGB 10.3* 05/07/2014   HCT 31.4* 05/07/2014   MCV 95 05/07/2014   PLT 115* 05/07/2014   No results found for this basename: LABCA2   No components found with this basename: [id number]   No results found for this basename: INR,  in the last 168 hours  STUDIES:  ASSESSMENT/PLAN: Mark [name] is 43 year old African American male with kappa light chain myeloma. 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 This was in January or early February.  He is not feeling well with the above mentioned symptoms.   His WBC are 2.2 today. This seems to be something viral. Possibly the flu. We will give him fluids today as well as some morphine for pain and toradol for inflamation.  We will also start him on Tamiflu today.  Prescriptions given for Hycodan andToradol. We discontinued his oxycodone.  We also re consulted radiology to evaluate him for kyphoplasty.  He has his treatment schedule.  All questions were answered and he knows to call here with any questions or concerns. We can certainly see him sooner if need be.   [name], NP 05/07/2014 2:14 PM

## 2014-05-08 ENCOUNTER — Emergency Department (HOSPITAL_COMMUNITY)
Admission: EM | Admit: 2014-05-08 | Discharge: 2014-05-08 | Disposition: A | Discharge status: Home / Self Care | Payer: BC Managed Care – PPO | Attending: Emergency Medicine | Admitting: Emergency Medicine

## 2014-05-08 ENCOUNTER — Encounter (HOSPITAL_COMMUNITY): Payer: Self-pay | Admitting: Emergency Medicine

## 2014-05-08 DIAGNOSIS — Z923 Personal history of irradiation: Secondary | ICD-10-CM | POA: Diagnosis not present

## 2014-05-08 DIAGNOSIS — J029 Acute pharyngitis, unspecified: Secondary | ICD-10-CM

## 2014-05-08 DIAGNOSIS — Z79899 Other long term (current) drug therapy: Secondary | ICD-10-CM | POA: Insufficient documentation

## 2014-05-08 DIAGNOSIS — K1379 Other lesions of oral mucosa: Secondary | ICD-10-CM | POA: Diagnosis not present

## 2014-05-08 DIAGNOSIS — Z7982 Long term (current) use of aspirin: Secondary | ICD-10-CM | POA: Insufficient documentation

## 2014-05-08 DIAGNOSIS — Z87891 Personal history of nicotine dependence: Secondary | ICD-10-CM | POA: Diagnosis not present

## 2014-05-08 DIAGNOSIS — J039 Acute tonsillitis, unspecified: Secondary | ICD-10-CM | POA: Insufficient documentation

## 2014-05-08 DIAGNOSIS — Z8579 Personal history of other malignant neoplasms of lymphoid, hematopoietic and related tissues: Secondary | ICD-10-CM | POA: Insufficient documentation

## 2014-05-08 DIAGNOSIS — Z859 Personal history of malignant neoplasm, unspecified: Secondary | ICD-10-CM | POA: Insufficient documentation

## 2014-05-08 DIAGNOSIS — J358 Other chronic diseases of tonsils and adenoids: Secondary | ICD-10-CM

## 2014-05-08 LAB — RAPID STREP SCREEN (MED CTR MEBANE ONLY): STREPTOCOCCUS, GROUP A SCREEN (DIRECT): NEGATIVE

## 2014-05-08 MED ORDER — NYSTATIN 100000 UNIT/ML MT SUSP: 500000.0000 [IU] | Freq: Four times a day (QID) | OROMUCOSAL | Status: DC

## 2014-05-08 MED ORDER — MAGIC MOUTHWASH W/LIDOCAINE: 5.0000 mL | Freq: Three times a day (TID) | ORAL | Status: DC

## 2014-05-08 NOTE — Discharge Instructions (Signed)
Thrush, Adult  °Thrush, also called oral candidiasis, is a fungal infection that develops in the mouth and throat and on the tongue. It causes white patches to form on the mouth and tongue. Thrush is most common in older adults, but it can occur at any age.  °Many cases of thrush are mild, but this infection can also be more serious. Thrush can be a recurring problem for people who have chronic illnesses or who take medicines that limit the body's ability to fight infection. Because these people have difficulty fighting infections, the fungus that causes thrush can spread throughout the body. This can cause life-threatening blood or organ infections. °CAUSES  °Thrush is usually caused by a yeast called Candida albicans. This fungus is normally present in small amounts in the mouth and on other mucous membranes. It usually causes no harm. However, when conditions are present that allow the fungus to grow uncontrolled, it invades surrounding tissues and becomes an infection. Less often, other Candida species can also lead to thrush.  °RISK FACTORS °Thrush is more likely to develop in the following people: °· People with an impaired ability to fight infection (weakened immune system).   °· Older adults.   °· People with HIV.   °· People with diabetes.   °· People with dry mouth (xerostomia).   °· Pregnant women.   °· People with poor dental care, especially those who have false teeth.   °· People who use antibiotic medicines.   °SIGNS AND SYMPTOMS  °Thrush can be a mild infection that causes no symptoms. If symptoms develop, they may include:  °· A burning feeling in the mouth and throat. This can occur at the start of a thrush infection.   °· White patches that adhere to the mouth and tongue. The tissue around the patches may be red, raw, and painful. If rubbed (during tooth brushing, for example), the patches and the tissue of the mouth may bleed easily.   °· A bad taste in the mouth or difficulty tasting foods.    °· Cottony feeling in the mouth.   °· Pain during eating and swallowing. °DIAGNOSIS  °Your health care provider can usually diagnose thrush by looking in your mouth and asking you questions about your health.  °TREATMENT  °Medicines that help prevent the growth of fungi (antifungals) are the standard treatment for thrush. These medicines are either applied directly to the affected area (topical) or swallowed (oral). The treatment will depend on the severity of the condition.  °Mild Thrush °Mild cases of thrush may clear up with the use of an antifungal mouth rinse or lozenges. Treatment usually lasts about 14 days.  °Moderate to Severe Thrush °· More severe thrush infections that have spread to the esophagus are treated with an oral antifungal medicine. A topical antifungal medicine may also be used.   °· For some severe infections, a treatment period longer than 14 days may be needed.   °· Oral antifungal medicines are almost never used during pregnancy because the fetus may be harmed. However, if a pregnant woman has a rare, severe thrush infection that has spread to her blood, oral antifungal medicines may be used. In this case, the risk of harm to the mother and fetus from the severe thrush infection may be greater than the risk posed by the use of antifungal medicines.   °Persistent or Recurrent Thrush °For cases of thrush that do not go away or keep coming back, treatment may involve the following:  °· Treatment may be needed twice as long as the symptoms last.   °· Treatment will   include both oral and topical antifungal medicines.   People with weakened immune systems can take an antifungal medicine on a continuous basis to prevent thrush infections.  It is important to treat conditions that make you more likely to get thrush, such as diabetes or HIV.  HOME CARE INSTRUCTIONS   Only take over-the-counter or prescription medicine as directed by your health care provider. Talk to your health care  provider about an over-the-counter medicine called gentian violet, which kills bacteria and fungi.   Eat plain, unflavored yogurt as directed by your health care provider. Check the label to make sure the yogurt contains live cultures. This yogurt can help healthy bacteria grow in the mouth that can stop the growth of the fungus that causes thrush.   Try these measures to help reduce the discomfort of thrush:   Drink cold liquids such as water or iced tea.   Try flavored ice treats or frozen juices.   Eat foods that are easy to swallow, such as gelatin, ice cream, or custard.   If the patches in your mouth are painful, try drinking from a straw.   Rinse your mouth several times a day with a warm saltwater rinse. You can make the saltwater mixture with 1 tsp (6 g) of salt in 8 fl oz (0.2 L) of warm water.   If you wear dentures, remove the dentures before going to bed, brush them vigorously, and soak them in a cleaning solution as directed by your health care provider.   Women who are breastfeeding should clean their nipples with an antifungal medicine as directed by their health care provider. Dry the nipples after breastfeeding. Applying lanolin-containing body lotion may help relieve nipple soreness.  SEEK MEDICAL CARE IF:  Your symptoms are getting worse or are not improving within 7 days of starting treatment.   You have symptoms of spreading infection, such as white patches on the skin outside of the mouth.   You are nursing and you have redness, burning, or pain in the nipples that is not relieved with treatment.  MAKE SURE YOU:  Understand these instructions.  Will watch your condition.  Will get help right away if you are not doing well or get worse. Document Released: 04/12/2004 Document Revised: 05/08/2013 Document Reviewed: 02/18/2013 [facility] Patient Information 2015 [place], [place]. This information is not intended to replace advice given to you by your  health care provider. Make sure you discuss any questions you have with your health care provider. Pharyngitis Pharyngitis is redness, pain, and swelling (inflammation) of your pharynx.  CAUSES  Pharyngitis is usually caused by infection. Most of the time, these infections are from viruses (viral) and are part of a cold. However, sometimes pharyngitis is caused by bacteria (bacterial). Pharyngitis can also be caused by allergies. Viral pharyngitis may be spread from person to person by coughing, sneezing, and personal items or utensils (cups, forks, spoons, toothbrushes). Bacterial pharyngitis may be spread from person to person by more intimate contact, such as kissing.  SIGNS AND SYMPTOMS  Symptoms of pharyngitis include:   Sore throat.   Tiredness (fatigue).   Low-grade fever.   Headache.  Joint pain and muscle aches.  Skin rashes.  Swollen lymph nodes.  Plaque-like film on throat or tonsils (often seen with bacterial pharyngitis). DIAGNOSIS  Your health care provider will ask you questions about your illness and your symptoms. Your medical history, along with a physical exam, is often all that is needed to diagnose pharyngitis. Sometimes, a  rapid strep test is done. Other lab tests may also be done, depending on the suspected cause.  TREATMENT  Viral pharyngitis will usually get better in 3-4 days without the use of medicine. Bacterial pharyngitis is treated with medicines that kill germs (antibiotics).  HOME CARE INSTRUCTIONS   Drink enough water and fluids to keep your urine clear or pale yellow.   Only take over-the-counter or prescription medicines as directed by your health care provider:   If you are prescribed antibiotics, make sure you finish them even if you start to feel better.   Do not take aspirin.   Get lots of rest.   Gargle with 8 oz of salt water ( tsp of salt per 1 qt of water) as often as every 1-2 hours to soothe your throat.   Throat  lozenges (if you are not at risk for choking) or sprays may be used to soothe your throat. SEEK MEDICAL CARE IF:   You have large, tender lumps in your neck.  You have a rash.  You cough up green, yellow-brown, or bloody spit. SEEK IMMEDIATE MEDICAL CARE IF:   Your neck becomes stiff.  You drool or are unable to swallow liquids.  You vomit or are unable to keep medicines or liquids down.  You have severe pain that does not go away with the use of recommended medicines.  You have trouble breathing (not caused by a stuffy nose). MAKE SURE YOU:   Understand these instructions.  Will watch your condition.  Will get help right away if you are not doing well or get worse. Document Released: 07/18/2005 Document Revised: 05/08/2013 Document Reviewed: 03/25/2013 [facility] Patient Information 2015 [place], [place]. This information is not intended to replace advice given to you by your health care provider. Make sure you discuss any questions you have with your health care provider.

## 2014-05-08 NOTE — ED Notes (Addendum)
Initial contact-Saw PCP yesterday. Prescribed Tamiflu. Reports "white things on the back of my throat." C/o sore throat with low grade fever. Denies N, V. Had diarrhea x1 this afternoon. Took ibuprofen and Tramadol this morning. No other complaints/concerns. Hx multiple myeloma-on oral medications and takes "a shot" once a week.

## 2014-05-08 NOTE — ED Provider Notes (Signed)
CSN: [id number]     Arrival date & time 05/08/14  2212 History   First MD Initiated Contact with Patient 05/08/14 2214    This chart was scribed for non-physician practitioner, Margarita Mail, PA-C, working with [name], MD by Forrestine Him, ED Scribe. This patient was seen in room WTR6/WTR6 and the patient's care was started at 10:48 PM.   No chief complaint on file.  The history is provided by the patient. No language interpreter was used.    HPI Comments: Mark [name] is a 43 y.o. male with a PMHx of multiple myeloma who presents to the Emergency Department complaining of constant, moderate sore throat onset few days. Pt also reports low grade fever between 99.5 to 99.8. He was seen by his PCP yesterday and was prescribed TamiFlu for flu like symptoms. Mark [name] states his white blood cell count is typically low and had his levels checked at last visit.. Currently he is on multiple medication including chemotherapy medications daily. No known allergies to medications.  Past Medical History  Diagnosis Date  . History of radiation therapy 02/07/13- 02/26/13    lower L spine, upper sacrum, 35 gray in 14 fractions  . Cancer   . Multiple myeloma dx'd 01/2013   Past Surgical History  Procedure Laterality Date  . Portacath placement    . Bone marrow transplant     Family History  Problem Relation Age of Onset  . Diabetic kidney disease Mother   . Hypertension Mother   . Heart attack Mother   . Kidney failure Mother   . Coronary artery disease Mother   . HIV Father    History  Substance Use Topics  . Smoking status: Former Smoker -- 1.00 packs/day for 15 years    Types: Cigarettes    Start date: 06/29/1988    Quit date: 06/29/2003  . Smokeless tobacco: Never Used     Comment: quit smoking 10 years ago  . Alcohol Use: No    Review of Systems  Constitutional: Positive for fever (Low grade). Negative for chills.  HENT: Positive for sore throat.       Allergies  Review of  patient's allergies indicates no known allergies.  Home Medications   Prior to Admission medications   Medication Sig Start Date End Date Taking? Authorizing Provider  acetaminophen (TYLENOL) 325 MG tablet Take 650 mg by mouth every 6 (six) hours as needed (pain).    Historical Provider, MD  acyclovir (ZOVIRAX) 800 MG tablet Take 1 tablet (800 mg total) by mouth 2 (two) times daily. 03/05/14   [name], NP  aspirin EC 325 MG tablet Take 1 tablet (325 mg total) by mouth daily. 02/28/14   Rondel Jumbo, PA-C  bisacodyl (DULCOLAX) 5 MG EC tablet Take 5 mg by mouth daily as needed for moderate constipation.    Historical Provider, MD  calcium-vitamin D (OSCAL WITH D) 500-200 MG-UNIT per tablet Take 1 tablet by mouth daily with breakfast.    Historical Provider, MD  gabapentin (NEURONTIN) 300 MG capsule Take 1-2 capsules (300-600 mg total) by mouth 3 (three) times daily. Take 370m in the morning and afternoon and take 6058min the evening 03/05/14   SaEliezer BottomNP  HYDROcodone-homatropine ([facility].5 MG/5ML syrup Take 5 mLs by mouth every 6 (six) hours as needed for cough. 05/07/14   PeVolanda NapoleonMD  ibuprofen (ADVIL,MOTRIN) 200 MG tablet Take 400 mg by mouth every 6 (six) hours as needed.  Historical Provider, MD  lenalidomide (REVLIMID) 10 MG capsule Take 1 capsule (10 mg total) by mouth daily. Auth # [id number] 04/14/14   [name], MD  oseltamivir (TAMIFLU) 75 MG capsule Take 1 capsule (75 mg total) by mouth 2 (two) times daily. 05/07/14   [name], NP  prochlorperazine (COMPAZINE) 10 MG tablet Take 1 tablet (10 mg total) by mouth every 6 (six) hours as needed for nausea or vomiting. 01/27/14   [name], MD  traMADol (ULTRAM) 50 MG tablet Take 1-2 pills, IF NEEDED, every 6 hrs for pain. 05/07/14   [name], MD   Triage Vitals: BP 125/82  Pulse 85  Temp(Src) 99.3 F (37.4 C) (Oral)  Resp 22  SpO2 100%   Physical Exam  Nursing note and vitals  reviewed. Constitutional: He is oriented to person, place, and time. He appears well-developed and well-nourished.  HENT:  Head: Normocephalic.  Mouth/Throat: Oropharyngeal exudate present.  Uvula is swollen Multiple exudate noted on uvula  Eyes: EOM are normal.  Neck: Normal range of motion.  Cardiovascular: Normal rate, regular rhythm and normal heart sounds.   Pulmonary/Chest: Effort normal and breath sounds normal.  Abdominal: He exhibits no distension.  Musculoskeletal: Normal range of motion.  Lymphadenopathy:    He has cervical adenopathy.  Neurological: He is alert and oriented to person, place, and time.  Psychiatric: He has a normal mood and affect.    ED Course  Procedures (including critical care time)  DIAGNOSTIC STUDIES: Oxygen Saturation is 100% on RA, Normal by my interpretation.    COORDINATION OF CARE: 11:03 PM- Will order rapid strep screen. Discussed treatment plan with pt at bedside and pt agreed to plan.     Labs Review Labs Reviewed - No data to display  Imaging Review Dg Chest 2 View  05/07/2014   CLINICAL DATA:  Cough, fever, fatigue  EXAM: CHEST  2 VIEW  COMPARISON:  Chest x-rays 05/01/2014  FINDINGS: No focal infiltrate or effusion is seen. Focal pleural thickening along the lateral right hemi thorax near the apex is stable. Right Port-A-Cath remains with the tip in the distal SVC near the expected right atrial junction. The heart is borderline enlarged.  IMPRESSION: No active cardiopulmonary disease. Port-A-Cath tip is in the distal SVC.   Electronically Signed   By: Ivar Drape M.D.   On: 05/07/2014 13:34     EKG Interpretation None      MDM   Final diagnoses:  Sore throat  Uvular hypertrophy  Tonsillar exudate    11:33 PM BP 125/82  Pulse 85  Temp(Src) 99.3 F (37.4 C) (Oral)  Resp 22  SpO2 100% Patient with Exudates over the uvula. ? Thrush. Negative rapid strep. Will d/c with magic mouth wash and nystatin oral solution. Strep  culture pending/ patient informed. CXR without acute abnormality.  Patient seen in shared visit with attending physician.  The patient appears reasonably screened and/or stabilized for discharge and I doubt any other medical condition or other [facility] requiring further screening, evaluation, or treatment in the ED at this time prior to discharge.  I personally performed the services described in this documentation, which was scribed in my presence. The recorded information has been reviewed and is accurate.    Margarita Mail, PA-C 05/08/14 2337

## 2014-05-09 NOTE — ED Provider Notes (Signed)
Medical screening examination/treatment/procedure(s) were conducted as a shared visit with non-physician practitioner(s) and myself.  I personally evaluated the patient during the encounter.  Posterior pharyngeal exudate on exam.  Primarily on the uvula.  Most likely a pharyngitis but some suggestion of thrush.  Will send culture. Treat with nystatin  [name], MD 05/09/14 [id number]

## 2014-05-10 LAB — CULTURE, GROUP A STREP

## 2014-05-12 ENCOUNTER — Ambulatory Visit (HOSPITAL_COMMUNITY)
Admission: RE | Admit: 2014-05-12 | Discharge: 2014-05-12 | Disposition: A | Discharge status: Home / Self Care | Payer: BC Managed Care – PPO | Source: Ambulatory Visit | Attending: Hematology & Oncology | Admitting: Hematology & Oncology

## 2014-05-12 DIAGNOSIS — M545 Low back pain, unspecified: Secondary | ICD-10-CM

## 2014-05-12 DIAGNOSIS — C9000 Multiple myeloma not having achieved remission: Secondary | ICD-10-CM

## 2014-05-12 DIAGNOSIS — J111 Influenza due to unidentified influenza virus with other respiratory manifestations: Secondary | ICD-10-CM

## 2014-05-13 ENCOUNTER — Other Ambulatory Visit: Payer: Self-pay | Admitting: Nurse Practitioner

## 2014-05-13 DIAGNOSIS — C9000 Multiple myeloma not having achieved remission: Secondary | ICD-10-CM

## 2014-05-13 MED ORDER — LENALIDOMIDE 10 MG PO CAPS: 10.0000 mg | ORAL_CAPSULE | Freq: Every day | ORAL | Status: DC

## 2014-05-14 ENCOUNTER — Other Ambulatory Visit (HOSPITAL_COMMUNITY): Payer: Self-pay | Admitting: Interventional Radiology

## 2014-05-14 DIAGNOSIS — C9000 Multiple myeloma not having achieved remission: Secondary | ICD-10-CM

## 2014-05-15 ENCOUNTER — Ambulatory Visit (HOSPITAL_BASED_OUTPATIENT_CLINIC_OR_DEPARTMENT_OTHER): Payer: BC Managed Care – PPO

## 2014-05-15 ENCOUNTER — Other Ambulatory Visit (HOSPITAL_BASED_OUTPATIENT_CLINIC_OR_DEPARTMENT_OTHER): Payer: BC Managed Care – PPO | Admitting: Lab

## 2014-05-15 VITALS — BP 108/68 | HR 82 | Temp 98.7°F | Resp 20

## 2014-05-15 DIAGNOSIS — Z5112 Encounter for antineoplastic immunotherapy: Secondary | ICD-10-CM

## 2014-05-15 DIAGNOSIS — C9000 Multiple myeloma not having achieved remission: Secondary | ICD-10-CM

## 2014-05-15 LAB — CBC WITH DIFFERENTIAL (CANCER CENTER ONLY)
BASO#: 0 10*3/uL (ref 0.0–0.2)
BASO%: 0 % (ref 0.0–2.0)
EOS%: 9.6 % — AB (ref 0.0–7.0)
Eosinophils Absolute: 0.2 10*3/uL (ref 0.0–0.5)
HEMATOCRIT: 33.2 % — AB (ref 38.7–49.9)
HGB: 10.7 g/dL — ABNORMAL LOW (ref 13.0–17.1)
LYMPH#: 1 10*3/uL (ref 0.9–3.3)
LYMPH%: 40.8 % (ref 14.0–48.0)
MCH: 30.9 pg (ref 28.0–33.4)
MCHC: 32.2 g/dL (ref 32.0–35.9)
MCV: 96 fL (ref 82–98)
MONO#: 0.3 10*3/uL (ref 0.1–0.9)
MONO%: 11.3 % (ref 0.0–13.0)
NEUT#: 0.9 10*3/uL — ABNORMAL LOW (ref 1.5–6.5)
NEUT%: 38.3 % — AB (ref 40.0–80.0)
Platelets: 113 10*3/uL — ABNORMAL LOW (ref 145–400)
RBC: 3.46 10*6/uL — ABNORMAL LOW (ref 4.20–5.70)
RDW: 14.6 % (ref 11.1–15.7)
WBC: 2.4 10*3/uL — ABNORMAL LOW (ref 4.0–10.0)

## 2014-05-15 LAB — BASIC METABOLIC PANEL
BUN: 16 mg/dL (ref 6–23)
CHLORIDE: 104 meq/L (ref 96–112)
CO2: 29 mEq/L (ref 19–32)
Calcium: 9 mg/dL (ref 8.4–10.5)
Creatinine, Ser: 1.09 mg/dL (ref 0.50–1.35)
GLUCOSE: 87 mg/dL (ref 70–99)
POTASSIUM: 4.8 meq/L (ref 3.5–5.3)
Sodium: 140 mEq/L (ref 135–145)

## 2014-05-15 MED ORDER — ONDANSETRON HCL 8 MG PO TABS
8.0000 mg | ORAL_TABLET | Freq: Once | ORAL | Status: AC
  Administered 2014-05-15: 8 mg via ORAL

## 2014-05-15 MED ORDER — BORTEZOMIB CHEMO SQ INJECTION 3.5 MG (2.5MG/ML)
1.3000 mg/m2 | Freq: Once | INTRAMUSCULAR | Status: AC
  Administered 2014-05-15: 2.75 mg via SUBCUTANEOUS
  Filled 2014-05-15: qty 2.75

## 2014-05-15 MED ORDER — ONDANSETRON HCL 8 MG PO TABS
ORAL_TABLET | ORAL | Status: AC
  Filled 2014-05-15: qty 1

## 2014-05-15 NOTE — Patient Instructions (Signed)
Bortezomib injection What is this medicine? BORTEZOMIB (bor TEZ oh mib) is a chemotherapy drug. It slows the growth of cancer cells. This medicine is used to treat multiple myeloma, and certain lymphomas, such as mantle-cell lymphoma. This medicine may be used for other purposes; ask your health care provider or pharmacist if you have questions. COMMON BRAND NAME(S): Velcade What should I tell my health care provider before I take this medicine? They need to know if you have any of these conditions: -diabetes -heart disease -irregular heartbeat -liver disease -on hemodialysis -low blood counts, like low white blood cells, platelets, or hemoglobin -peripheral neuropathy -taking medicine for blood pressure -an unusual or allergic reaction to bortezomib, mannitol, boron, other medicines, foods, dyes, or preservatives -pregnant or trying to get pregnant -breast-feeding How should I use this medicine? This medicine is for injection into a vein or for injection under the skin. It is given by a health care professional in a hospital or clinic setting. Talk to your pediatrician regarding the use of this medicine in children. Special care may be needed. Overdosage: If you think you have taken too much of this medicine contact a poison control center or emergency room at once. NOTE: This medicine is only for you. Do not share this medicine with others. What if I miss a dose? It is important not to miss your dose. Call your doctor or health care professional if you are unable to keep an appointment. What may interact with this medicine? This medicine may interact with the following medications: -ketoconazole -rifampin -ritonavir -St. John's Wort This list may not describe all possible interactions. Give your health care provider a list of all the medicines, herbs, non-prescription drugs, or dietary supplements you use. Also tell them if you smoke, drink alcohol, or use illegal drugs. Some items  may interact with your medicine. What should I watch for while using this medicine? Visit your doctor for checks on your progress. This drug may make you feel generally unwell. This is not uncommon, as chemotherapy can affect healthy cells as well as cancer cells. Report any side effects. Continue your course of treatment even though you feel ill unless your doctor tells you to stop. You may get drowsy or dizzy. Do not drive, use machinery, or do anything that needs mental alertness until you know how this medicine affects you. Do not stand or sit up quickly, especially if you are an older patient. This reduces the risk of dizzy or fainting spells. In some cases, you may be given additional medicines to help with side effects. Follow all directions for their use. Call your doctor or health care professional for advice if you get a fever, chills or sore throat, or other symptoms of a cold or flu. Do not treat yourself. This drug decreases your body's ability to fight infections. Try to avoid being around people who are sick. This medicine may increase your risk to bruise or bleed. Call your doctor or health care professional if you notice any unusual bleeding. You may need blood work done while you are taking this medicine. In some patients, this medicine may cause a serious brain infection that may cause death. If you have any problems seeing, thinking, speaking, walking, or standing, tell your doctor right away. If you cannot reach your doctor, urgently seek other source of medical care. Do not become pregnant while taking this medicine. Women should inform their doctor if they wish to become pregnant or think they might be pregnant. There is   a potential for serious side effects to an unborn child. Talk to your health care professional or pharmacist for more information. Do not breast-feed an infant while taking this medicine. Check with your doctor or health care professional if you get an attack of  severe diarrhea, nausea and vomiting, or if you sweat a lot. The loss of too much body fluid can make it dangerous for you to take this medicine. What side effects may I notice from receiving this medicine? Side effects that you should report to your doctor or health care professional as soon as possible: -allergic reactions like skin rash, itching or hives, swelling of the face, lips, or tongue -breathing problems -changes in hearing -changes in vision -fast, irregular heartbeat -feeling faint or lightheaded, falls -pain, tingling, numbness in the hands or feet -right upper belly pain -seizures -swelling of the ankles, feet, hands -unusual bleeding or bruising -unusually weak or tired -vomiting -yellowing of the eyes or skin Side effects that usually do not require medical attention (report to your doctor or health care professional if they continue or are bothersome): -changes in emotions or moods -constipation -diarrhea -loss of appetite -headache -irritation at site where injected -nausea This list may not describe all possible side effects. Call your doctor for medical advice about side effects. You may report side effects to FDA at 1-800-FDA-1088. Where should I keep my medicine? This drug is given in a hospital or clinic and will not be stored at home. NOTE: This sheet is a summary. It may not cover all possible information. If you have questions about this medicine, talk to your doctor, pharmacist, or health care provider.  2015, [employer]vier/Gold Standard. (2013-05-13 12:46:32)  

## 2014-05-16 ENCOUNTER — Ambulatory Visit: Payer: Self-pay

## 2014-05-16 ENCOUNTER — Other Ambulatory Visit: Payer: Self-pay | Admitting: Lab

## 2014-05-17 ENCOUNTER — Other Ambulatory Visit: Payer: Self-pay | Admitting: Radiology

## 2014-05-19 ENCOUNTER — Other Ambulatory Visit: Payer: Self-pay | Admitting: Radiology

## 2014-05-20 ENCOUNTER — Ambulatory Visit (HOSPITAL_COMMUNITY)
Admission: RE | Admit: 2014-05-20 | Discharge: 2014-05-20 | Disposition: A | Discharge status: Home / Self Care | Payer: BC Managed Care – PPO | Source: Ambulatory Visit | Attending: Interventional Radiology | Admitting: Interventional Radiology

## 2014-05-20 ENCOUNTER — Encounter (HOSPITAL_COMMUNITY): Payer: Self-pay

## 2014-05-20 ENCOUNTER — Other Ambulatory Visit (HOSPITAL_COMMUNITY): Payer: Self-pay | Admitting: Interventional Radiology

## 2014-05-20 DIAGNOSIS — Z923 Personal history of irradiation: Secondary | ICD-10-CM | POA: Insufficient documentation

## 2014-05-20 DIAGNOSIS — Z87891 Personal history of nicotine dependence: Secondary | ICD-10-CM | POA: Diagnosis not present

## 2014-05-20 DIAGNOSIS — Z9481 Bone marrow transplant status: Secondary | ICD-10-CM | POA: Insufficient documentation

## 2014-05-20 DIAGNOSIS — C9000 Multiple myeloma not having achieved remission: Secondary | ICD-10-CM | POA: Diagnosis present

## 2014-05-20 DIAGNOSIS — M4854XA Collapsed vertebra, not elsewhere classified, thoracic region, initial encounter for fracture: Secondary | ICD-10-CM | POA: Diagnosis not present

## 2014-05-20 DIAGNOSIS — Z79899 Other long term (current) drug therapy: Secondary | ICD-10-CM | POA: Insufficient documentation

## 2014-05-20 DIAGNOSIS — Z7982 Long term (current) use of aspirin: Secondary | ICD-10-CM | POA: Diagnosis not present

## 2014-05-20 HISTORY — PX: KYPHOPLASTY: SHX5884

## 2014-05-20 LAB — BASIC METABOLIC PANEL
Anion gap: 11 (ref 5–15)
BUN: 14 mg/dL (ref 6–23)
CHLORIDE: 104 meq/L (ref 96–112)
CO2: 24 mEq/L (ref 19–32)
CREATININE: 1.08 mg/dL (ref 0.50–1.35)
Calcium: 8.4 mg/dL (ref 8.4–10.5)
GFR calc Af Amer: >90 mL/min (ref >90)
GFR, EST NON AFRICAN AMERICAN: 84 mL/min — AB (ref >90)
Glucose, Bld: 96 mg/dL (ref 70–99)
Potassium: 4.3 mEq/L (ref 3.7–5.3)
Sodium: 139 mEq/L (ref 137–147)

## 2014-05-20 LAB — CBC
HCT: 29.7 % — ABNORMAL LOW (ref 39.0–52.0)
HEMOGLOBIN: 9.8 g/dL — AB (ref 13.0–17.0)
MCH: 30.3 pg (ref 26.0–34.0)
MCHC: 33 g/dL (ref 30.0–36.0)
MCV: 92 fL (ref 78.0–100.0)
Platelets: 87 10*3/uL — ABNORMAL LOW (ref 150–400)
RBC: 3.23 MIL/uL — ABNORMAL LOW (ref 4.22–5.81)
RDW: 14.5 % (ref 11.5–15.5)
WBC: 1.7 10*3/uL — ABNORMAL LOW (ref 4.0–10.5)

## 2014-05-20 LAB — PROTIME-INR
INR: 1.06 (ref 0.00–1.49)
PROTHROMBIN TIME: 13.9 s (ref 11.6–15.2)

## 2014-05-20 LAB — APTT: aPTT: 28 seconds (ref 24–37)

## 2014-05-20 MED ORDER — SODIUM CHLORIDE 0.9 % IV SOLN
INTRAVENOUS | Status: AC | PRN
  Administered 2014-05-20: 10 mL/h via INTRAVENOUS

## 2014-05-20 MED ORDER — MIDAZOLAM HCL 2 MG/2ML IJ SOLN
INTRAMUSCULAR | Status: AC
  Filled 2014-05-20: qty 2

## 2014-05-20 MED ORDER — TOBRAMYCIN SULFATE 1.2 G IJ SOLR
INTRAMUSCULAR | Status: AC
  Filled 2014-05-20: qty 1.2

## 2014-05-20 MED ORDER — HYDROMORPHONE HCL 1 MG/ML IJ SOLN
INTRAMUSCULAR | Status: AC
  Filled 2014-05-20: qty 2

## 2014-05-20 MED ORDER — FENTANYL CITRATE 0.05 MG/ML IJ SOLN
INTRAMUSCULAR | Status: AC | PRN
  Administered 2014-05-20: 25 ug via INTRAVENOUS
  Administered 2014-05-20: 12.5 ug via INTRAVENOUS
  Administered 2014-05-20 (×2): 25 ug via INTRAVENOUS
  Administered 2014-05-20: 12.5 ug via INTRAVENOUS

## 2014-05-20 MED ORDER — CEFAZOLIN SODIUM-DEXTROSE 2-3 GM-% IV SOLR
INTRAVENOUS | Status: AC
  Administered 2014-05-20: 2 g via INTRAVENOUS
  Filled 2014-05-20: qty 50

## 2014-05-20 MED ORDER — CEFAZOLIN SODIUM-DEXTROSE 2-3 GM-% IV SOLR
INTRAVENOUS | Status: AC
  Filled 2014-05-20: qty 50

## 2014-05-20 MED ORDER — SODIUM CHLORIDE 0.9 % IV SOLN: Freq: Once | INTRAVENOUS | Status: DC

## 2014-05-20 MED ORDER — BUPIVACAINE HCL (PF) 0.25 % IJ SOLN
INTRAMUSCULAR | Status: AC
  Filled 2014-05-20: qty 30

## 2014-05-20 MED ORDER — CEFAZOLIN SODIUM-DEXTROSE 2-3 GM-% IV SOLR
2.0000 g | Freq: Once | INTRAVENOUS | Status: AC
Start: 2014-05-20 — End: 2014-05-20
  Administered 2014-05-20: 2 g via INTRAVENOUS

## 2014-05-20 MED ORDER — SODIUM CHLORIDE 0.9 % IV SOLN: INTRAVENOUS | Status: AC

## 2014-05-20 MED ORDER — MIDAZOLAM HCL 2 MG/2ML IJ SOLN
INTRAMUSCULAR | Status: AC | PRN
  Administered 2014-05-20: 1 mg via INTRAVENOUS
  Administered 2014-05-20: 0.5 mg via INTRAVENOUS
  Administered 2014-05-20: 1 mg via INTRAVENOUS
  Administered 2014-05-20: 0.5 mg via INTRAVENOUS

## 2014-05-20 MED ORDER — FENTANYL CITRATE 0.05 MG/ML IJ SOLN
INTRAMUSCULAR | Status: AC
  Filled 2014-05-20: qty 2

## 2014-05-20 NOTE — Sedation Documentation (Signed)
283ml bolus given per MD

## 2014-05-20 NOTE — Procedures (Signed)
S/P T 12 RF ablation with vertebral body  augmentation

## 2014-05-20 NOTE — Discharge Instructions (Signed)
1No stooping,bending,or lifting more than 10 lbs for 2 weeks. 2.use walker to ambulate for 2 weeks. 3.RTC in 2 weeks.   KYPHOPLASTY/VERTEBROPLASTY DISCHARGE INSTRUCTIONS  Medications: (check all that apply)     Resume all home medications as before procedure.            Continue your pain medications as prescribed as needed.  Over the next 3-5 days, decrease your pain medication as tolerated.  Over the counter medications (i.e. Tylenol, ibuprofen, and aleve) may be substituted once severe/moderate pain symptoms have subsided.   Wound Care:   Bandages may be removed the day following your procedure.  You may get your incision wet once bandages are removed.  Bandaids may be used to cover the incisions until scab formation.  Topical ointments are optional.    If you develop a fever greater than 101 degrees, have increased skin redness at the incision sites or pus-like oozing from incisions occurring within 1 week of the procedure, contact radiology at [phone] or [phone].    Ice pack to back for 15-20 minutes 2-3 time per day for first 2-3 days post procedure.  The ice will expedite muscle healing and help with the pain from the incisions.   Activity:   Bedrest today with limited activity for 24 hours post procedure.    No driving for 48 hours.    Increase your activity as tolerated after bedrest (with assistance if necessary).    Refrain from any strenuous activity or heavy lifting (greater than 10 lbs.).   Follow up:   Contact radiology at [phone] or [phone] if any questions/concerns.    A physician assistant from radiology will contact you in approximately 1 week.    If a biopsy was performed at the time of your procedure, your referring physician should receive the results in usually 2-3 days.

## 2014-05-20 NOTE — H&P (Signed)
Chief Complaint: Back Pain Multiple Myeloma  Referring Physician(s): Deveshwar,Sanjeev K  History of Present Illness: Mark [name] is a 43 y.o. male  Dx MM 2014 New back pain for several weeks MRI reveals acute fx Thoracic 12 With tumor involvement Consulted with Dr Estanislado Pandy Now scheduled for T12 tumor ablation and vertebral body augmentation   Past Medical History  Diagnosis Date  . History of radiation therapy 02/07/13- 02/26/13    lower L spine, upper sacrum, 35 gray in 14 fractions  . Cancer   . Multiple myeloma dx'd 01/2013    Past Surgical History  Procedure Laterality Date  . Portacath placement    . Bone marrow transplant      Allergies: Review of patient's allergies indicates no known allergies.  Medications: Prior to Admission medications   Medication Sig Start Date End Date Taking? Authorizing Provider  acetaminophen (TYLENOL) 325 MG tablet Take 650 mg by mouth every 6 (six) hours as needed (pain).   Yes Historical Provider, MD  acyclovir (ZOVIRAX) 800 MG tablet Take 1 tablet (800 mg total) by mouth 2 (two) times daily. 03/05/14  Yes [name], NP  Alum & Mag Hydroxide-Simeth (MAGIC MOUTHWASH W/LIDOCAINE) SOLN Take 5 mLs by mouth 3 (three) times daily. 05/08/14  Yes Margarita Mail, PA-C  aspirin EC 325 MG tablet Take 1 tablet (325 mg total) by mouth daily. 02/28/14  Yes Coralee Pesa Wertman, PA-C  bisacodyl (DULCOLAX) 5 MG EC tablet Take 5 mg by mouth daily as needed for moderate constipation.   Yes Historical Provider, MD  calcium-vitamin D (OSCAL WITH D) 500-200 MG-UNIT per tablet Take 1 tablet by mouth daily with breakfast.   Yes Historical Provider, MD  gabapentin (NEURONTIN) 300 MG capsule Take 1-2 capsules (300-600 mg total) by mouth 3 (three) times daily. Take 360m in the morning and afternoon and take 6046min the evening 03/05/14  Yes SaEliezer BottomNP  ibuprofen (ADVIL,MOTRIN) 200 MG tablet Take 400 mg by mouth every 6 (six) hours as needed  (pain).    Yes Historical Provider, MD  lenalidomide (REVLIMID) 10 MG capsule Take 1 capsule (10 mg total) by mouth daily. Auth # [id number]  Yes PeVolanda NapoleonMD  oseltamivir (TAMIFLU) 75 MG capsule Take 1 capsule (75 mg total) by mouth 2 (two) times daily. 05/07/14  Yes SaEliezer BottomNP  traMADol (ULTRAM) 50 MG tablet Take 50-100 mg by mouth every 6 (six) hours as needed (pain).   Yes Historical Provider, MD    Family History  Problem Relation Age of Onset  . Diabetic kidney disease Mother   . Hypertension Mother   . Heart attack Mother   . Kidney failure Mother   . Coronary artery disease Mother   . HIV Father     History   Social History  . Marital Status: Married    Spouse Name: N/A    Number of Children: N/A  . Years of Education: N/A   Social History Main Topics  . Smoking status: Former Smoker -- 1.00 packs/day for 15 years    Types: Cigarettes    Start date: 06/29/1988    Quit date: 06/29/2003  . Smokeless tobacco: Never Used     Comment: quit smoking 10 years ago  . Alcohol Use: No  . Drug Use: No  . Sexual Activity: No   Other Topics Concern  . None   Social History Narrative  . None    Review of Systems: A 12 point  ROS discussed and pertinent positives are indicated in the HPI above.  All other systems are negative.  Review of Systems  Constitutional: Negative for fever, activity change, appetite change and fatigue.  Respiratory: Negative for cough and shortness of breath.   Cardiovascular: Negative for chest pain.  Gastrointestinal: Negative for abdominal pain.  Genitourinary: Negative for difficulty urinating.  Musculoskeletal: Positive for back pain.  Psychiatric/Behavioral: Negative for behavioral problems and confusion.    Vital Signs: BP 109/91  Pulse 73  Temp(Src) 97.4 F (36.3 C) (Oral)  Resp 20  Ht _0  (1.803 m)  Wt 89.812 kg (198 lb)  BMI 27.63 kg/m2  SpO2 100%  Physical Exam  Constitutional: He is oriented to  person, place, and time. He appears well-nourished.  Cardiovascular: Normal rate, regular rhythm and normal heart sounds.   Pulmonary/Chest: Effort normal and breath sounds normal. He has no wheezes.  Abdominal: Soft. Bowel sounds are normal. There is no tenderness.  Musculoskeletal: Normal range of motion.  Back pain  Neurological: He is alert and oriented to person, place, and time.  Skin: Skin is warm and dry.  Psychiatric: He has a normal mood and affect. His behavior is normal. Judgment and thought content normal.    Imaging: Dg Chest 2 View  05/07/2014   CLINICAL DATA:  Cough, fever, fatigue  EXAM: CHEST  2 VIEW  COMPARISON:  Chest x-rays 05/01/2014  FINDINGS: No focal infiltrate or effusion is seen. Focal pleural thickening along the lateral right hemi thorax near the apex is stable. Right Port-A-Cath remains with the tip in the distal SVC near the expected right atrial junction. The heart is borderline enlarged.  IMPRESSION: No active cardiopulmonary disease. Port-A-Cath tip is in the distal SVC.   Electronically Signed   By: Ivar Drape M.D.   On: 05/07/2014 13:34   Dg Chest 2 View  05/01/2014   CLINICAL DATA:  Productive cough for 2 or 3 weeks. History of pneumonia and multiple myeloma. Initial encounter.  EXAM: CHEST  2 VIEW  COMPARISON:  Chest CT 02/25/2014.  Radiographs 02/25/2014.  FINDINGS: Right IJ Port-A-Cath tip is unchanged at the SVC right atrial junction. The heart size and mediastinal contours are stable. There is improved aeration of the lungs. The previously demonstrated patchy left lower lobe airspace disease has resolved. There is no pleural effusion or edema. The bones appear unremarkable.  IMPRESSION: Interval clearing of left lower lobe infiltrate and overall improved pulmonary aeration. No acute cardiopulmonary process.   Electronically Signed   By: Camie Patience M.D.   On: 05/01/2014 12:48   Ir Radiologist Eval & Mgmt  05/13/2014   EXAM: NEW PATIENT OFFICE VISIT   CHIEF COMPLAINT: Low back pain.  History of multiple myeloma.  Current Pain Level: 1-10  HISTORY OF PRESENT ILLNESS: The patient is a 43 year old referred for evaluation for vertebral body augmentation for pain relief of compression fracture due to multiple myeloma at T12. The patient reports a having been diagnosed with multiple myeloma a few months ago. He was subsequently treated with bone marrow transplantation and subsequently placed on a maintenance dose of Revlimid.  The patient was diagnosed to have a compression fracture on 29th July 2015 during workup for pain which is felt to be related to multiple myeloma.  The patient reports persistent nagging and sometimes incapacitating pain in the lower thoracic region especially on raising his arms above his head and also upon lifting at work.  He claims the pain to be a 7 out  of 10.  There is intermittent circumferential radiation to the right of the chest wall. He denies any symptoms of autonomic dysfunction of his bowel or bladder. He denies any symptoms of fevers, chills or rigors. No symptoms of UTI such as dysuria, hematuria or flank pain. The patient takes tramadol 50 mg every 6 hr as needed when the pain is unbearable.  Past Medical History: Multiple myeloma as above.  Past Surgeries:  He denies any previous surgeries.  Medications: Revlimid. Acyclovir. Dulcolax. Compazine. Tramadol. Ibuprofen. Gabapentin. Calcium. Aspirin 325 mg. Acetaminophen. Tamiflu. Lidocaine. Mycostatin.  Allergies:  No known.  Social History: Married. Has three children all healthy. Two siblings. The patient works at a [employer] onto his job. Denies any alcohol or smoking. Denies use of illicit chemicals.  Family History: Diabetes in mother. Mother with renal failure. Mother with stroke. Sister with thyroid problems.  REVIEW OF SYSTEMS: Essentially negative for pathologic symptomatology.  PHYSICAL EXAMINATION: In no acute distress. Affect within normal limits. Neurologically no  lateralizing features. Alert, awake and oriented to time, place, space. Speech and comprehension within normal limits. Palpation of the back reveals tenderness in the lower thoracic region just to the right of midline.  ASSESSMENT AND PLAN: The patient's MRI scan from July 29th was reviewed.  This demonstrates a compression fracture at T12 with no significant retropulsion.  There, however, is abnormal signal within the vertebral body.  Given the patient's clinical history and examination, it was felt the patient could benefit from vertebral body augmentation at T12. This could be proceeded by radiofrequency ablation of the vertebral body prior to the installation of methylmethacrylate mixture. The procedure, the risks, the benefits and the alternatives were all reviewed in detail. Questions were answered to the patient's satisfaction.  The patient would like to proceed with the vertebral body augmentation, and if technically possible, proceeded by radiofrequency ablation. The patient was informed, the ablation would depend upon the morphology of the fracture at the time of the procedure. Should there be evidence of further collapse, installation of the methylmethacrylate mixture may be the safest treatment option. The patient understands and would like to proceed. The procedure will be scheduled at the earliest possible.   Electronically Signed   By: Luanne Bras M.D.   On: 05/12/2014 16:39    Labs:  CBC:  Recent Labs  05/01/14 1158 05/07/14 1329 05/15/14 1049 05/20/14 0711  WBC 3.6* 2.2* 2.4* 1.7*  HGB 9.2* 10.3* 10.7* 9.8*  HCT 28.8* 31.4* 33.2* 29.7*  PLT 152 115* 113* 87*    COAGS:  Recent Labs  07/26/13 0720 05/20/14 0711  INR 0.93 1.06  APTT  --  28    BMP:  Recent Labs  02/26/14 0715 02/27/14 0545 02/28/14 0500  05/01/14 1158 05/07/14 1329 05/15/14 1049 05/20/14 0711  NA 139 136* 137  < > 142 141 140 139  K 3.9 4.8 4.7  < > 3.8 3.6 4.8 4.3  CL 105 101 102  < >  103 102 104 104  CO2 _0 < > _1 GLUCOSE 93 106* 107*  < > 119* 88 87 96  BUN _2 < > _3 CALCIUM 7.4* 9.1 9.3  < > 8.4 10.2 9.0 8.4  CREATININE 1.09 1.18 1.12  < > 0.9 1.0 1.09 1.08  GFRNONAA 83* 75* 80*  --   --   --   --  84*  GFRAA >90 87* >90  --   --   --   --  >  90  < > = values in this interval not displayed.  LIVER FUNCTION TESTS:  Recent Labs  02/27/14 0545 02/28/14 0500 03/05/14 0927 03/20/14 1050 04/03/14 1008 05/07/14 1329  BILITOT 0.9 0.6 1.40 1.8* 2.0* 1.30  AST _0 ALT 39 _1 ALKPHOS 90 81 61 46 48 35  PROT 7.3 6.8 8.0 7.0 6.9 7.6  ALBUMIN 3.6 3.3*  --  4.1 4.3  --     TUMOR MARKERS: No results found for this basename: AFPTM, CEA, CA199, CHROMGRNA,  in the last 8760 hours  Assessment and Plan:  Hx MM New back pain MRI reveals T12 fx with tumor Pt now scheduled for T12 tumor ablation and augmentation Aware of procedure benefits and risks and agreeable to proceed Consent signed andin chart  Thank you for this interesting consult.  I greatly enjoyed meeting Mark [name] and look forward to participating in their care.    I spent a total of 20 minutes face to face in clinical consultation, greater than 50% of which was counseling/coordinating care for T12 tumor ablation/augmentation  Signed: Aquila Menzie A 05/20/2014, 8:55 AM

## 2014-05-22 MED FILL — Tobramycin Sulfate For Inj 1.2 GM: INTRAMUSCULAR | Qty: 1 | Status: AC

## 2014-05-23 ENCOUNTER — Encounter: Payer: Self-pay | Admitting: *Deleted

## 2014-05-23 NOTE — Progress Notes (Signed)
Received fax from [facility] stating that patient prescription of Revlimid has shipped.

## 2014-06-02 ENCOUNTER — Other Ambulatory Visit (HOSPITAL_COMMUNITY): Payer: Self-pay | Admitting: Interventional Radiology

## 2014-06-02 DIAGNOSIS — C9000 Multiple myeloma not having achieved remission: Secondary | ICD-10-CM

## 2014-06-02 DIAGNOSIS — M549 Dorsalgia, unspecified: Secondary | ICD-10-CM

## 2014-06-03 ENCOUNTER — Ambulatory Visit (HOSPITAL_COMMUNITY)
Admission: RE | Admit: 2014-06-03 | Discharge: 2014-06-03 | Disposition: A | Discharge status: Home / Self Care | Payer: BC Managed Care – PPO | Source: Ambulatory Visit | Attending: Interventional Radiology | Admitting: Interventional Radiology

## 2014-06-03 DIAGNOSIS — M549 Dorsalgia, unspecified: Secondary | ICD-10-CM

## 2014-06-03 DIAGNOSIS — C9000 Multiple myeloma not having achieved remission: Secondary | ICD-10-CM

## 2014-06-04 ENCOUNTER — Ambulatory Visit: Payer: Self-pay | Admitting: Hematology & Oncology

## 2014-06-04 ENCOUNTER — Ambulatory Visit: Payer: Self-pay

## 2014-06-04 ENCOUNTER — Other Ambulatory Visit: Payer: Self-pay | Admitting: Lab

## 2014-06-06 ENCOUNTER — Ambulatory Visit (HOSPITAL_BASED_OUTPATIENT_CLINIC_OR_DEPARTMENT_OTHER): Payer: BC Managed Care – PPO

## 2014-06-06 ENCOUNTER — Ambulatory Visit (HOSPITAL_BASED_OUTPATIENT_CLINIC_OR_DEPARTMENT_OTHER): Payer: BC Managed Care – PPO | Admitting: Hematology & Oncology

## 2014-06-06 ENCOUNTER — Other Ambulatory Visit (HOSPITAL_BASED_OUTPATIENT_CLINIC_OR_DEPARTMENT_OTHER): Payer: BC Managed Care – PPO | Admitting: Lab

## 2014-06-06 ENCOUNTER — Encounter: Payer: Self-pay | Admitting: Hematology & Oncology

## 2014-06-06 ENCOUNTER — Telehealth: Payer: Self-pay | Admitting: *Deleted

## 2014-06-06 ENCOUNTER — Ambulatory Visit (HOSPITAL_BASED_OUTPATIENT_CLINIC_OR_DEPARTMENT_OTHER)
Admission: RE | Admit: 2014-06-06 | Discharge: 2014-06-06 | Disposition: A | Discharge status: Home / Self Care | Payer: BC Managed Care – PPO | Source: Ambulatory Visit | Attending: Hematology & Oncology | Admitting: Hematology & Oncology

## 2014-06-06 ENCOUNTER — Other Ambulatory Visit: Payer: Self-pay | Admitting: *Deleted

## 2014-06-06 VITALS — BP 119/76 | HR 73 | Temp 98.4°F | Resp 18 | Ht 70.0 in | Wt 206.0 lb

## 2014-06-06 DIAGNOSIS — M25569 Pain in unspecified knee: Secondary | ICD-10-CM | POA: Diagnosis present

## 2014-06-06 DIAGNOSIS — M25561 Pain in right knee: Secondary | ICD-10-CM | POA: Diagnosis not present

## 2014-06-06 DIAGNOSIS — C9000 Multiple myeloma not having achieved remission: Secondary | ICD-10-CM

## 2014-06-06 DIAGNOSIS — M25562 Pain in left knee: Secondary | ICD-10-CM | POA: Insufficient documentation

## 2014-06-06 DIAGNOSIS — Z5112 Encounter for antineoplastic immunotherapy: Secondary | ICD-10-CM

## 2014-06-06 LAB — CBC WITH DIFFERENTIAL (CANCER CENTER ONLY)
BASO#: 0 10*3/uL (ref 0.0–0.2)
BASO%: 0.5 % (ref 0.0–2.0)
EOS%: 7 % (ref 0.0–7.0)
Eosinophils Absolute: 0.1 10*3/uL (ref 0.0–0.5)
HCT: 30.4 % — ABNORMAL LOW (ref 38.7–49.9)
HGB: 10.3 g/dL — ABNORMAL LOW (ref 13.0–17.1)
LYMPH#: 0.8 10*3/uL — AB (ref 0.9–3.3)
LYMPH%: 44.3 % (ref 14.0–48.0)
MCH: 32 pg (ref 28.0–33.4)
MCHC: 33.9 g/dL (ref 32.0–35.9)
MCV: 94 fL (ref 82–98)
MONO#: 0.3 10*3/uL (ref 0.1–0.9)
MONO%: 18.4 % — ABNORMAL HIGH (ref 0.0–13.0)
NEUT%: 29.8 % — ABNORMAL LOW (ref 40.0–80.0)
NEUTROS ABS: 0.6 10*3/uL — AB (ref 1.5–6.5)
Platelets: 116 10*3/uL — ABNORMAL LOW (ref 145–400)
RBC: 3.22 10*6/uL — ABNORMAL LOW (ref 4.20–5.70)
RDW: 15.1 % (ref 11.1–15.7)
WBC: 1.9 10*3/uL — AB (ref 4.0–10.0)

## 2014-06-06 LAB — CMP (CANCER CENTER ONLY)
ALT: 16 U/L (ref 10–47)
AST: 21 U/L (ref 11–38)
Albumin: 3.7 g/dL (ref 3.3–5.5)
Alkaline Phosphatase: 42 U/L (ref 26–84)
BUN, Bld: 16 mg/dL (ref 7–22)
CHLORIDE: 106 meq/L (ref 98–108)
CO2: 25 meq/L (ref 18–33)
CREATININE: 1 mg/dL (ref 0.6–1.2)
Calcium: 8.1 mg/dL (ref 8.0–10.3)
GLUCOSE: 101 mg/dL (ref 73–118)
Potassium: 3.6 mEq/L (ref 3.3–4.7)
Sodium: 139 mEq/L (ref 128–145)
Total Bilirubin: 1.7 mg/dl — ABNORMAL HIGH (ref 0.20–1.60)
Total Protein: 6.9 g/dL (ref 6.4–8.1)

## 2014-06-06 MED ORDER — ONDANSETRON HCL 8 MG PO TABS
8.0000 mg | ORAL_TABLET | Freq: Once | ORAL | Status: AC
  Administered 2014-06-06: 8 mg via ORAL

## 2014-06-06 MED ORDER — ZOLEDRONIC ACID 4 MG/100ML IV SOLN
4.0000 mg | Freq: Once | INTRAVENOUS | Status: AC
  Administered 2014-06-06: 4 mg via INTRAVENOUS
  Filled 2014-06-06: qty 100

## 2014-06-06 MED ORDER — KETOROLAC TROMETHAMINE 15 MG/ML IJ SOLN
INTRAMUSCULAR | Status: AC
  Filled 2014-06-06: qty 2

## 2014-06-06 MED ORDER — KETOROLAC TROMETHAMINE 30 MG/ML IJ SOLN: 30.0000 mg | Freq: Once | INTRAMUSCULAR | Status: DC

## 2014-06-06 MED ORDER — KETOROLAC TROMETHAMINE 30 MG/ML IJ SOLN
30.0000 mg | Freq: Once | INTRAMUSCULAR | Status: AC
  Administered 2014-06-06: 30 mg via INTRAVENOUS
  Filled 2014-06-06: qty 1

## 2014-06-06 MED ORDER — ACYCLOVIR 800 MG PO TABS: 800.0000 mg | ORAL_TABLET | Freq: Two times a day (BID) | ORAL | Status: DC

## 2014-06-06 MED ORDER — ONDANSETRON HCL 8 MG PO TABS
ORAL_TABLET | ORAL | Status: AC
  Filled 2014-06-06: qty 1

## 2014-06-06 MED ORDER — SODIUM CHLORIDE 0.9 % IJ SOLN
10.0000 mL | INTRAMUSCULAR | Status: AC | PRN
  Administered 2014-06-06: 10 mL
  Filled 2014-06-06: qty 10

## 2014-06-06 MED ORDER — HEPARIN SOD (PORK) LOCK FLUSH 100 UNIT/ML IV SOLN
500.0000 [IU] | INTRAVENOUS | Status: AC | PRN
  Administered 2014-06-06: 500 [IU]
  Filled 2014-06-06: qty 5

## 2014-06-06 MED ORDER — BORTEZOMIB CHEMO SQ INJECTION 3.5 MG (2.5MG/ML)
1.3000 mg/m2 | Freq: Once | INTRAMUSCULAR | Status: AC
  Administered 2014-06-06: 2.75 mg via SUBCUTANEOUS
  Filled 2014-06-06: qty 2.75

## 2014-06-06 MED ORDER — GABAPENTIN 300 MG PO CAPS: 300.0000 mg | ORAL_CAPSULE | Freq: Three times a day (TID) | ORAL | Status: DC

## 2014-06-06 NOTE — Telephone Encounter (Addendum)
Left message.   ----- Message from [name], MD sent at 06/06/2014  3:59 PM EST ----- Please call and let him know that the knee x-rays look good. There is no obvious abnormalities that they see. There is no myeloma. We can certainly give some pain medicine if he needs it. Laurey Arrow

## 2014-06-06 NOTE — Progress Notes (Signed)
Hematology and Oncology Follow Up Visit  Mark [name] [id number] [birth year] 43 y.o. 06/06/2014   Principle Diagnosis:   Kappa light chain myeloma  Current Therapy:   Revlimid 10mg  po q day (21/7) Zometa 4 mg IV q. month Velcade q 3 week dosing     Interim History:  Mark [name] is back for followup. He is feeling pretty well. He did undergo kyphoplasty on his back. This was done on October 20. He had kyphoplasty at T12.  the pathology report did not show any obvious myeloma. He's having lower back discomfort. 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 This might be causing some problems. We will get another MRI for an evaluation.  He is also complaining of pain in his knees. There's been no swelling. He's had no warmth of the knees. We will get some plain films of the knees. Is possible he may have some changes from his transplant and the high-dose chemotherapy.  He does feel a little tired. He's taken the Revlimid every day. I think we can probably have him take this 21 days on and then 7 days off.  He is working. He is doing okay at work.  His last myeloma studies back in September showed a Kappa  Light chain of 4.64.we may have to get a 24 hour urine on him.  Overall, his performance status is ECOG 1.  Medications: Current outpatient prescriptions: acetaminophen (TYLENOL) 325 MG tablet, Take 650 mg by mouth every 6 (six) hours as needed (pain)., Disp: , Rfl: ;  aspirin EC 325 MG tablet, Take 1 tablet (325 mg total) by mouth daily., Disp: 100 tablet, Rfl: 3;  bisacodyl (DULCOLAX) 5 MG EC tablet, Take 5 mg by mouth daily as needed for moderate constipation., Disp: , Rfl:  calcium-vitamin D (OSCAL WITH D) 500-200 MG-UNIT per tablet, Take 1 tablet by mouth daily with breakfast., Disp: , Rfl: ;  ibuprofen (ADVIL,MOTRIN) 200 MG tablet, Take 400 mg by mouth every 6 (six) hours as needed (pain). , Disp: , Rfl: ;  lenalidomide (REVLIMID) 10 MG capsule, Take 1 capsule (10 mg total) by mouth daily.  Auth # [id number], Disp: 28 capsule, Rfl: 0 prochlorperazine (COMPAZINE) 10 MG tablet, Take 10 mg by mouth every 8 (eight) hours as needed for nausea or vomiting., Disp: , Rfl: ;  traMADol (ULTRAM) 50 MG tablet, Take 50-100 mg by mouth every 6 (six) hours as needed (pain)., Disp: , Rfl: ;  acyclovir (ZOVIRAX) 800 MG tablet, Take 1 tablet (800 mg total) by mouth 2 (two) times daily., Disp: 60 tablet, Rfl: 0 gabapentin (NEURONTIN) 300 MG capsule, Take 1-2 capsules (300-600 mg total) by mouth 3 (three) times daily. Take 300mg  in the morning and afternoon and take 600mg  in the evening, Disp: 120 capsule, Rfl: 3 No current facility-administered medications for this visit. Facility-Administered Medications Ordered in Other Visits: ketorolac (TORADOL) 30 MG/ML injection 30 mg, 30 mg, Intravenous, Once, [name], MD;  zolendronic acid (ZOMETA) 4 mg in sodium chloride 0.9 % 100 mL IVPB, 4 mg, Intravenous, Once, [name], MD  Allergies: No Known Allergies  Past Medical History, Surgical history, Social history, and Family History were reviewed and updated.  Review of Systems: As above  Physical Exam:  height is 5\' 10"  (1.778 m) and weight is 206 lb (93.441 kg). His oral temperature is 98.4 F (36.9 C). His blood pressure is 119/76 and his pulse is 73. His respiration is 18.   Ill-appearing African American  gentleman. He is alert and oriented x3. Vital signs are stable. Head and neck exam shows no ocular or oral lesions. He has no palpable cervical or supraclavicular lymph nodes. Oral cavity is totally dry. Lungs are clear. Cardiac exam regular rate and rhythmrhythm with no murmurs rubs or bruits. Abdomen is soft. he has good bowel sounds. There is no fluid wave. There is no palpable liver or spleen tip. Back exam no tenderness over the spine ribs or hips. Extremities shows no clubbing cyanosis or edema.  He has good range of motion of his knees. There is no swelling of his knee jointSkin exam no  rashes. Neurological exam is nonfocal.  Lab Results  Component Value Date   WBC 1.9* 06/06/2014   HGB 10.3* 06/06/2014   HCT 30.4* 06/06/2014   MCV 94 06/06/2014   PLT 116* 06/06/2014     Chemistry      Component Value Date/Time   NA 139 06/06/2014 0821   NA 139 05/20/2014 0711   NA 145 02/21/2013 0908   K 3.6 06/06/2014 0821   K 4.3 05/20/2014 0711   K 3.8 02/21/2013 0908   CL 106 06/06/2014 0821   CL 104 05/20/2014 0711   CO2 25 06/06/2014 0821   CO2 24 05/20/2014 0711   CO2 32* 02/21/2013 0908   BUN 16 06/06/2014 0821   BUN 14 05/20/2014 0711   BUN 19.5 02/21/2013 0908   CREATININE 1.0 06/06/2014 0821   CREATININE 1.08 05/20/2014 0711   CREATININE 1.5* 02/21/2013 0908      Component Value Date/Time   CALCIUM 8.1 06/06/2014 0821   CALCIUM 8.4 05/20/2014 0711   CALCIUM 17.7 Repeated and Verified* 02/21/2013 0908   CALCIUM >15.0* 02/01/2013 1230   ALKPHOS 42 06/06/2014 0821   ALKPHOS 48 04/03/2014 1008   AST 21 06/06/2014 0821   AST 22 04/03/2014 1008   ALT 16 06/06/2014 0821   ALT 16 04/03/2014 1008   BILITOT 1.70* 06/06/2014 0821   BILITOT 2.0* 04/03/2014 1008         Impression and Plan: Mark [name] is 43 year old done. 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 This was in January or early February.  He is here for his Velcade. He will get Zometa.  I will see about the x-rays of his knees. I will also set up the MRI.  We will have to get a 24-hour urine on him. I think this will be very important for [place] to do.  We will have him take the Revlimid 21 days on and 7 days off.  I will plan to see him back in 3 weeks.  I spent about 30 minutes with him today.  [name], MD 11/6/20159:05 AM

## 2014-06-06 NOTE — Patient Instructions (Signed)
Bortezomib injection What is this medicine? BORTEZOMIB (bor TEZ oh mib) is a chemotherapy drug. It slows the growth of cancer cells. This medicine is used to treat multiple myeloma, and certain lymphomas, such as mantle-cell lymphoma. This medicine may be used for other purposes; ask your health care provider or pharmacist if you have questions. COMMON BRAND NAME(S): Velcade What should I tell my health care provider before I take this medicine? They need to know if you have any of these conditions: -diabetes -heart disease -irregular heartbeat -liver disease -on hemodialysis -low blood counts, like low white blood cells, platelets, or hemoglobin -peripheral neuropathy -taking medicine for blood pressure -an unusual or allergic reaction to bortezomib, mannitol, boron, other medicines, foods, dyes, or preservatives -pregnant or trying to get pregnant -breast-feeding How should I use this medicine? This medicine is for injection into a vein or for injection under the skin. It is given by a health care professional in a hospital or clinic setting. Talk to your pediatrician regarding the use of this medicine in children. Special care may be needed. Overdosage: If you think you have taken too much of this medicine contact a poison control center or emergency room at once. NOTE: This medicine is only for you. Do not share this medicine with others. What if I miss a dose? It is important not to miss your dose. Call your doctor or health care professional if you are unable to keep an appointment. What may interact with this medicine? This medicine may interact with the following medications: -ketoconazole -rifampin -ritonavir -St. John's Wort This list may not describe all possible interactions. Give your health care provider a list of all the medicines, herbs, non-prescription drugs, or dietary supplements you use. Also tell them if you smoke, drink alcohol, or use illegal drugs. Some items  may interact with your medicine. What should I watch for while using this medicine? Visit your doctor for checks on your progress. This drug may make you feel generally unwell. This is not uncommon, as chemotherapy can affect healthy cells as well as cancer cells. Report any side effects. Continue your course of treatment even though you feel ill unless your doctor tells you to stop. You may get drowsy or dizzy. Do not drive, use machinery, or do anything that needs mental alertness until you know how this medicine affects you. Do not stand or sit up quickly, especially if you are an older patient. This reduces the risk of dizzy or fainting spells. In some cases, you may be given additional medicines to help with side effects. Follow all directions for their use. Call your doctor or health care professional for advice if you get a fever, chills or sore throat, or other symptoms of a cold or flu. Do not treat yourself. This drug decreases your body's ability to fight infections. Try to avoid being around people who are sick. This medicine may increase your risk to bruise or bleed. Call your doctor or health care professional if you notice any unusual bleeding. You may need blood work done while you are taking this medicine. In some patients, this medicine may cause a serious brain infection that may cause death. If you have any problems seeing, thinking, speaking, walking, or standing, tell your doctor right away. If you cannot reach your doctor, urgently seek other source of medical care. Do not become pregnant while taking this medicine. Women should inform their doctor if they wish to become pregnant or think they might be pregnant. There is   a potential for serious side effects to an unborn child. Talk to your health care professional or pharmacist for more information. Do not breast-feed an infant while taking this medicine. Check with your doctor or health care professional if you get an attack of  severe diarrhea, nausea and vomiting, or if you sweat a lot. The loss of too much body fluid can make it dangerous for you to take this medicine. What side effects may I notice from receiving this medicine? Side effects that you should report to your doctor or health care professional as soon as possible: -allergic reactions like skin rash, itching or hives, swelling of the face, lips, or tongue -breathing problems -changes in hearing -changes in vision -fast, irregular heartbeat -feeling faint or lightheaded, falls -pain, tingling, numbness in the hands or feet -right upper belly pain -seizures -swelling of the ankles, feet, hands -unusual bleeding or bruising -unusually weak or tired -vomiting -yellowing of the eyes or skin Side effects that usually do not require medical attention (report to your doctor or health care professional if they continue or are bothersome): -changes in emotions or moods -constipation -diarrhea -loss of appetite -headache -irritation at site where injected -nausea This list may not describe all possible side effects. Call your doctor for medical advice about side effects. You may report side effects to FDA at 1-800-FDA-1088. Where should I keep my medicine? This drug is given in a hospital or clinic and will not be stored at home. NOTE: This sheet is a summary. It may not cover all possible information. If you have questions about this medicine, talk to your doctor, pharmacist, or health care provider.  2015, [employer]vier/Gold Standard. (2013-05-13 12:46:32)  

## 2014-06-10 ENCOUNTER — Telehealth: Payer: Self-pay | Admitting: *Deleted

## 2014-06-10 LAB — KAPPA/LAMBDA LIGHT CHAINS
KAPPA FREE LGHT CHN: 3.17 mg/dL — AB (ref 0.33–1.94)
Kappa:Lambda Ratio: 0.95 (ref 0.26–1.65)
LAMBDA FREE LGHT CHN: 3.34 mg/dL — AB (ref 0.57–2.63)

## 2014-06-10 LAB — LACTATE DEHYDROGENASE: LDH: 175 U/L (ref 94–250)

## 2014-06-10 LAB — SPEP & IFE WITH QIG
ALBUMIN ELP: 60.3 % (ref 55.8–66.1)
ALPHA-1-GLOBULIN: 4.2 % (ref 2.9–4.9)
ALPHA-2-GLOBULIN: 7.4 % (ref 7.1–11.8)
BETA 2: 4.5 % (ref 3.2–6.5)
BETA GLOBULIN: 5.5 % (ref 4.7–7.2)
Gamma Globulin: 18.1 % (ref 11.1–18.8)
IGM, SERUM: 25 mg/dL — AB (ref 41–251)
IgA: 146 mg/dL (ref 68–379)
IgG (Immunoglobin G), Serum: 1300 mg/dL (ref 650–1600)
M-Spike, %: 0.25 g/dL
Total Protein, Serum Electrophoresis: 6.6 g/dL (ref 6.0–8.3)

## 2014-06-10 NOTE — Telephone Encounter (Addendum)
-----   Message from [name], MD sent at 06/09/2014  4:07 PM EST ----- Call and tell him that his labs look better. His light chain level is lower. Mark [name] -Left voicemail informing pt that labs look better. His light chain level is lower

## 2014-06-12 ENCOUNTER — Other Ambulatory Visit: Payer: Self-pay | Admitting: *Deleted

## 2014-06-12 DIAGNOSIS — C9000 Multiple myeloma not having achieved remission: Secondary | ICD-10-CM

## 2014-06-12 MED ORDER — HYDROCODONE-IBUPROFEN 7.5-200 MG PO TABS: 1.0000 | ORAL_TABLET | Freq: Three times a day (TID) | ORAL | Status: DC | PRN

## 2014-06-12 MED ORDER — LENALIDOMIDE 10 MG PO CAPS: 10.0000 mg | ORAL_CAPSULE | Freq: Every day | ORAL | Status: DC

## 2014-06-18 ENCOUNTER — Telehealth: Payer: Self-pay | Admitting: *Deleted

## 2014-06-18 ENCOUNTER — Other Ambulatory Visit: Payer: Self-pay | Admitting: *Deleted

## 2014-06-18 DIAGNOSIS — C9000 Multiple myeloma not having achieved remission: Secondary | ICD-10-CM

## 2014-06-18 MED ORDER — AZITHROMYCIN 250 MG PO TABS: ORAL_TABLET | ORAL | Status: DC

## 2014-06-18 NOTE — Telephone Encounter (Signed)
Patient c/o head congestion, head aches and body aches. Has low grade fever of 99.6. He wants a prescription for a z-pack. Spoke to Dr Marin Olp. Will call prescription in.

## 2014-06-19 ENCOUNTER — Ambulatory Visit (HOSPITAL_COMMUNITY)
Admission: RE | Admit: 2014-06-19 | Discharge: 2014-06-19 | Disposition: A | Discharge status: Home / Self Care | Payer: BC Managed Care – PPO | Source: Ambulatory Visit | Attending: Hematology & Oncology | Admitting: Hematology & Oncology

## 2014-06-19 ENCOUNTER — Other Ambulatory Visit: Payer: Self-pay | Admitting: Hematology & Oncology

## 2014-06-19 DIAGNOSIS — M8448XS Pathological fracture, other site, sequela: Secondary | ICD-10-CM | POA: Diagnosis not present

## 2014-06-19 DIAGNOSIS — C9030 Solitary plasmacytoma not having achieved remission: Secondary | ICD-10-CM | POA: Insufficient documentation

## 2014-06-19 DIAGNOSIS — C9000 Multiple myeloma not having achieved remission: Secondary | ICD-10-CM | POA: Insufficient documentation

## 2014-06-19 DIAGNOSIS — M479 Spondylosis, unspecified: Secondary | ICD-10-CM | POA: Insufficient documentation

## 2014-06-19 DIAGNOSIS — M5146 Schmorl's nodes, lumbar region: Secondary | ICD-10-CM | POA: Insufficient documentation

## 2014-06-19 DIAGNOSIS — Z9484 Stem cells transplant status: Secondary | ICD-10-CM | POA: Diagnosis not present

## 2014-06-19 DIAGNOSIS — M2548 Effusion, other site: Secondary | ICD-10-CM | POA: Diagnosis not present

## 2014-06-19 DIAGNOSIS — R200 Anesthesia of skin: Secondary | ICD-10-CM | POA: Insufficient documentation

## 2014-06-19 DIAGNOSIS — M545 Low back pain: Secondary | ICD-10-CM | POA: Diagnosis present

## 2014-06-19 MED ORDER — GADOBENATE DIMEGLUMINE 529 MG/ML IV SOLN
19.0000 mL | Freq: Once | INTRAVENOUS | Status: AC | PRN
  Administered 2014-06-19: 19 mL via INTRAVENOUS

## 2014-06-20 ENCOUNTER — Telehealth: Payer: Self-pay | Admitting: *Deleted

## 2014-06-20 NOTE — Telephone Encounter (Addendum)
Spoke to patient. Also feeling better after we called in his z-pack prescription earlier this week  ----- Message from [name], MD sent at 06/19/2014  5:45 PM EST ----- Please call and let him know that the MRI does not show any obvious myeloma progression. This is good news. He has some arthritic issues which might be causing the problems that he is having. Laurey Arrow

## 2014-07-01 ENCOUNTER — Encounter: Payer: Self-pay | Admitting: *Deleted

## 2014-07-01 NOTE — Progress Notes (Signed)
Received fax from [facility] stating that the patient's prescription was shipped on 06/30/14.

## 2014-07-03 ENCOUNTER — Encounter: Payer: Self-pay | Admitting: Hematology & Oncology

## 2014-07-03 ENCOUNTER — Ambulatory Visit (HOSPITAL_BASED_OUTPATIENT_CLINIC_OR_DEPARTMENT_OTHER): Payer: BC Managed Care – PPO | Admitting: Lab

## 2014-07-03 ENCOUNTER — Ambulatory Visit (HOSPITAL_BASED_OUTPATIENT_CLINIC_OR_DEPARTMENT_OTHER): Payer: BC Managed Care – PPO

## 2014-07-03 ENCOUNTER — Ambulatory Visit (HOSPITAL_BASED_OUTPATIENT_CLINIC_OR_DEPARTMENT_OTHER): Payer: BC Managed Care – PPO | Admitting: Hematology & Oncology

## 2014-07-03 VITALS — BP 125/84 | HR 80 | Temp 98.8°F | Resp 18 | Ht 70.0 in | Wt 211.0 lb

## 2014-07-03 DIAGNOSIS — C9000 Multiple myeloma not having achieved remission: Secondary | ICD-10-CM

## 2014-07-03 DIAGNOSIS — Z5112 Encounter for antineoplastic immunotherapy: Secondary | ICD-10-CM

## 2014-07-03 LAB — CBC WITH DIFFERENTIAL (CANCER CENTER ONLY)
BASO#: 0 10*3/uL (ref 0.0–0.2)
BASO%: 0.5 % (ref 0.0–2.0)
EOS%: 7.2 % — ABNORMAL HIGH (ref 0.0–7.0)
Eosinophils Absolute: 0.2 10*3/uL (ref 0.0–0.5)
HEMATOCRIT: 32.2 % — AB (ref 38.7–49.9)
HGB: 11 g/dL — ABNORMAL LOW (ref 13.0–17.1)
LYMPH#: 0.9 10*3/uL (ref 0.9–3.3)
LYMPH%: 44 % (ref 14.0–48.0)
MCH: 32.5 pg (ref 28.0–33.4)
MCHC: 34.2 g/dL (ref 32.0–35.9)
MCV: 95 fL (ref 82–98)
MONO#: 0.3 10*3/uL (ref 0.1–0.9)
MONO%: 14 % — ABNORMAL HIGH (ref 0.0–13.0)
NEUT#: 0.7 10*3/uL — ABNORMAL LOW (ref 1.5–6.5)
NEUT%: 34.3 % — AB (ref 40.0–80.0)
PLATELETS: 163 10*3/uL (ref 145–400)
RBC: 3.38 10*6/uL — ABNORMAL LOW (ref 4.20–5.70)
RDW: 14.8 % (ref 11.1–15.7)
WBC: 2.1 10*3/uL — AB (ref 4.0–10.0)

## 2014-07-03 LAB — CMP (CANCER CENTER ONLY)
ALT(SGPT): 15 U/L (ref 10–47)
AST: 19 U/L (ref 11–38)
Albumin: 3.7 g/dL (ref 3.3–5.5)
Alkaline Phosphatase: 45 U/L (ref 26–84)
BILIRUBIN TOTAL: 2.1 mg/dL — AB (ref 0.20–1.60)
BUN, Bld: 12 mg/dL (ref 7–22)
CO2: 27 meq/L (ref 18–33)
Calcium: 8.1 mg/dL (ref 8.0–10.3)
Chloride: 107 mEq/L (ref 98–108)
Creat: 1.3 mg/dl — ABNORMAL HIGH (ref 0.6–1.2)
Glucose, Bld: 91 mg/dL (ref 73–118)
Potassium: 3.3 mEq/L (ref 3.3–4.7)
SODIUM: 139 meq/L (ref 128–145)
TOTAL PROTEIN: 6.9 g/dL (ref 6.4–8.1)

## 2014-07-03 MED ORDER — BORTEZOMIB CHEMO SQ INJECTION 3.5 MG (2.5MG/ML)
1.3000 mg/m2 | Freq: Once | INTRAMUSCULAR | Status: AC
  Administered 2014-07-03: 2.75 mg via SUBCUTANEOUS
  Filled 2014-07-03: qty 2.75

## 2014-07-03 MED ORDER — SODIUM CHLORIDE 0.9 % IJ SOLN
10.0000 mL | INTRAMUSCULAR | Status: DC | PRN
  Administered 2014-07-03: 10 mL via INTRAVENOUS
  Filled 2014-07-03: qty 10

## 2014-07-03 MED ORDER — ONDANSETRON HCL 8 MG PO TABS
ORAL_TABLET | ORAL | Status: AC
  Filled 2014-07-03: qty 1

## 2014-07-03 MED ORDER — HEPARIN SOD (PORK) LOCK FLUSH 100 UNIT/ML IV SOLN
250.0000 [IU] | Freq: Once | INTRAVENOUS | Status: DC | PRN
  Filled 2014-07-03: qty 5

## 2014-07-03 MED ORDER — ONDANSETRON HCL 8 MG PO TABS
8.0000 mg | ORAL_TABLET | Freq: Once | ORAL | Status: AC
  Administered 2014-07-03: 8 mg via ORAL

## 2014-07-03 MED ORDER — SODIUM CHLORIDE 0.9 % IJ SOLN
3.0000 mL | Freq: Once | INTRAMUSCULAR | Status: DC | PRN
  Filled 2014-07-03: qty 10

## 2014-07-03 MED ORDER — HEPARIN SOD (PORK) LOCK FLUSH 100 UNIT/ML IV SOLN
500.0000 [IU] | Freq: Once | INTRAVENOUS | Status: AC
  Administered 2014-07-03: 500 [IU] via INTRAVENOUS
  Filled 2014-07-03: qty 5

## 2014-07-03 MED ORDER — ZOLEDRONIC ACID 4 MG/100ML IV SOLN
4.0000 mg | Freq: Once | INTRAVENOUS | Status: AC
  Administered 2014-07-03: 4 mg via INTRAVENOUS
  Filled 2014-07-03: qty 100

## 2014-07-03 NOTE — Patient Instructions (Signed)
[facility] Discharge Instructions for Patients Receiving Chemotherapy  Today you received the following chemotherapy agents Zometa and Velcade.   To help prevent nausea and vomiting after your treatment, we encourage you to take your nausea medication as prescribed.    If you develop nausea and vomiting that is not controlled by your nausea medication, call the clinic.   BELOW ARE SYMPTOMS THAT SHOULD BE REPORTED IMMEDIATELY:  *FEVER GREATER THAN 100.5 F  *CHILLS WITH OR WITHOUT FEVER  NAUSEA AND VOMITING THAT IS NOT CONTROLLED WITH YOUR NAUSEA MEDICATION  *UNUSUAL SHORTNESS OF BREATH  *UNUSUAL BRUISING OR BLEEDING  TENDERNESS IN MOUTH AND THROAT WITH OR WITHOUT PRESENCE OF ULCERS  *URINARY PROBLEMS  *BOWEL PROBLEMS  UNUSUAL RASH Items with * indicate a potential emergency and should be followed up as soon as possible.  Feel free to call the clinic you have any questions or concerns. The clinic phone number is [phone].

## 2014-07-03 NOTE — Progress Notes (Signed)
Hematology and Oncology Follow Up Visit  Mark [name] [id number] Feb 12, 1971 43 y.o. 07/03/2014   Principle Diagnosis:   Kappa light chain myeloma  Current Therapy:   Revlimid 10mg  po q day (21/7) Zometa 4 mg IV q. month Velcade q 3 week dosing     Interim History:  [name] is back for followup. He is feeling pretty well. He did undergo kyphoplasty on his back. This was done on October 20. He had kyphoplasty at T12.  The pathology report did not show any obvious myeloma. He's having lower back discomfort. 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 When he lies down, he has a little bit of numbness in the right hip and lateral thigh.  He's having some pain in the left upper thigh. We have done a lot of x-rays and so far we have not found any obvious bony abnormality. .  We did do an MRI of his lumbar spine back in mid November. Everything looked pretty stable. He does have some spinal stenosis which is chronic and congenital. There is some disc bulge into the left L4-L5 space which could be affecting the left L4 nerve.  He is also complaining of pain in his knees. We did go ahead and get some x-rays of his knees. Everything looked okay. There is no obvious arthritis or bone spurs or myelomatous lesions.   He does feel a little tired. He is working.    His last myeloma studies back in September showed a Kappa  Light chain of 3.17.we may have to get a 24 hour urine on him. His M spike was 0.25 g/L  Overall, his performance status is ECOG 1.  Medications: Current outpatient prescriptions: acetaminophen (TYLENOL) 325 MG tablet, Take 650 mg by mouth every 6 (six) hours as needed (pain)., Disp: , Rfl: ;  acyclovir (ZOVIRAX) 800 MG tablet, Take 1 tablet (800 mg total) by mouth 2 (two) times daily., Disp: 60 tablet, Rfl: 0;  aspirin EC 325 MG tablet, Take 1 tablet (325 mg total) by mouth daily., Disp: 100 tablet, Rfl: 3 bisacodyl (DULCOLAX) 5 MG EC tablet, Take 5 mg by mouth daily as needed  for moderate constipation., Disp: , Rfl: ;  calcium-vitamin D (OSCAL WITH D) 500-200 MG-UNIT per tablet, Take 1 tablet by mouth daily with breakfast., Disp: , Rfl: ;  gabapentin (NEURONTIN) 300 MG capsule, Take 1-2 capsules (300-600 mg total) by mouth 3 (three) times daily. Take 300mg  in the morning and afternoon and take 600mg  in the evening, Disp: 120 capsule, Rfl: 3 HYDROcodone-ibuprofen (VICOPROFEN) 7.5-200 MG per tablet, Take 1-2 tablets by mouth every 8 (eight) hours as needed for moderate pain., Disp: 120 tablet, Rfl: 0;  ibuprofen (ADVIL,MOTRIN) 200 MG tablet, Take 400 mg by mouth every 6 (six) hours as needed (pain). , Disp: , Rfl: ;  lenalidomide (REVLIMID) 10 MG capsule, Take 1 capsule (10 mg total) by mouth daily. Auth # [id number], Disp: 28 capsule, Rfl: 0 prochlorperazine (COMPAZINE) 10 MG tablet, Take 10 mg by mouth every 8 (eight) hours as needed for nausea or vomiting., Disp: , Rfl:  No current facility-administered medications for this visit. Facility-Administered Medications Ordered in Other Visits: heparin lock flush 100 unit/mL, 250 Units, Intracatheter, Once PRN, [name], MD;  sodium chloride 0.9 % injection 3 mL, 3 mL, Intravenous, Once PRN, [name], MD  Allergies: No Known Allergies  Past Medical History, Surgical history, Social history, and Family History were reviewed and updated.  Review  of Systems: As above  Physical Exam:  height is 5\' 10"  (1.778 m) and weight is 211 lb (95.709 kg). His oral temperature is 98.8 F (37.1 C). His blood pressure is 125/84 and his pulse is 80. His respiration is 18.   Ill-appearing African American gentleman. He is alert and oriented x3. Vital signs are stable. Head and neck exam shows no ocular or oral lesions. He has no palpable cervical or supraclavicular lymph nodes. Oral cavity is totally dry. Lungs are clear. Cardiac exam regular rate and rhythm with no murmurs rubs or bruits. Abdomen is soft. he has good bowel sounds.  There is no fluid wave. There is no palpable liver or spleen tip. Back exam no tenderness over the spine ribs or hips. Extremities shows no clubbing cyanosis or edema.  He has good range of motion of his knees. There is no swelling of his knee jointSkin exam no rashes. Neurological exam is nonfocal.  Lab Results  Component Value Date   WBC 2.1* 07/03/2014   HGB 11.0* 07/03/2014   HCT 32.2* 07/03/2014   MCV 95 07/03/2014   PLT 163 07/03/2014     Chemistry      Component Value Date/Time   NA 139 07/03/2014 1045   NA 139 05/20/2014 0711   NA 145 02/21/2013 0908   K 3.3 07/03/2014 1045   K 4.3 05/20/2014 0711   K 3.8 02/21/2013 0908   CL 107 07/03/2014 1045   CL 104 05/20/2014 0711   CO2 27 07/03/2014 1045   CO2 24 05/20/2014 0711   CO2 32* 02/21/2013 0908   BUN 12 07/03/2014 1045   BUN 14 05/20/2014 0711   BUN 19.5 02/21/2013 0908   CREATININE 1.3* 07/03/2014 1045   CREATININE 1.08 05/20/2014 0711   CREATININE 1.5* 02/21/2013 0908      Component Value Date/Time   CALCIUM 8.1 07/03/2014 1045   CALCIUM 8.4 05/20/2014 0711   CALCIUM 17.7 Repeated and Verified* 02/21/2013 0908   CALCIUM >15.0* 02/01/2013 1230   ALKPHOS 45 07/03/2014 1045   ALKPHOS 48 04/03/2014 1008   AST 19 07/03/2014 1045   AST 22 04/03/2014 1008   ALT 15 07/03/2014 1045   ALT 16 04/03/2014 1008   BILITOT 2.10* 07/03/2014 1045   BILITOT 2.0* 04/03/2014 1008         Impression and Plan: Mark [name] is 43 year old -year-old Afro-American male. 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 This was in January or early February.  He is here for his Velcade. He will get Zometa.  We will give me a 24-hour urine container.   I will plan to see him back in 6 weeks. He gets his Velcade every 2 weeks.  I spent about 30 minutes with him today.  [name], MD 12/3/201512:16 PM

## 2014-07-07 LAB — PROTEIN ELECTROPHORESIS, SERUM, WITH REFLEX
ALPHA-1-GLOBULIN: 4 % (ref 2.9–4.9)
Albumin ELP: 59.4 % (ref 55.8–66.1)
Alpha-2-Globulin: 7.7 % (ref 7.1–11.8)
Beta 2: 4.8 % (ref 3.2–6.5)
Beta Globulin: 5.3 % (ref 4.7–7.2)
Gamma Globulin: 18.8 % (ref 11.1–18.8)
M-SPIKE, %: 0.26 g/dL
Total Protein, Serum Electrophoresis: 7.3 g/dL (ref 6.0–8.3)

## 2014-07-07 LAB — IGG, IGA, IGM
IGA: 130 mg/dL (ref 68–379)
IGM, SERUM: 21 mg/dL — AB (ref 41–251)
IgG (Immunoglobin G), Serum: 1370 mg/dL (ref 650–1600)

## 2014-07-07 LAB — KAPPA/LAMBDA LIGHT CHAINS
Kappa free light chain: 2.92 mg/dL — ABNORMAL HIGH (ref 0.33–1.94)
Kappa:Lambda Ratio: 0.99 (ref 0.26–1.65)
LAMBDA FREE LGHT CHN: 2.96 mg/dL — AB (ref 0.57–2.63)

## 2014-07-07 LAB — IFE INTERPRETATION

## 2014-07-08 ENCOUNTER — Telehealth: Payer: Self-pay | Admitting: *Deleted

## 2014-07-08 NOTE — Telephone Encounter (Addendum)
Spoke with patient and gave info  ----- Message from [name], MD sent at 07/04/2014  4:33 PM EST ----- Call and let him know that the myeloma levels are still nice and low. Mark [name]

## 2014-07-21 ENCOUNTER — Other Ambulatory Visit: Payer: Self-pay | Admitting: *Deleted

## 2014-07-21 DIAGNOSIS — C9000 Multiple myeloma not having achieved remission: Secondary | ICD-10-CM

## 2014-07-21 MED ORDER — LENALIDOMIDE 10 MG PO CAPS: 10.0000 mg | ORAL_CAPSULE | Freq: Every day | ORAL | Status: DC

## 2014-07-23 ENCOUNTER — Emergency Department (HOSPITAL_COMMUNITY)
Admission: EM | Admit: 2014-07-23 | Discharge: 2014-07-23 | Disposition: A | Discharge status: Home / Self Care | Payer: BC Managed Care – PPO | Attending: Emergency Medicine | Admitting: Emergency Medicine

## 2014-07-23 ENCOUNTER — Encounter (HOSPITAL_COMMUNITY): Payer: Self-pay | Admitting: Emergency Medicine

## 2014-07-23 DIAGNOSIS — B029 Zoster without complications: Secondary | ICD-10-CM | POA: Diagnosis not present

## 2014-07-23 DIAGNOSIS — Z7982 Long term (current) use of aspirin: Secondary | ICD-10-CM | POA: Insufficient documentation

## 2014-07-23 DIAGNOSIS — M549 Dorsalgia, unspecified: Secondary | ICD-10-CM | POA: Diagnosis not present

## 2014-07-23 DIAGNOSIS — Z79899 Other long term (current) drug therapy: Secondary | ICD-10-CM | POA: Insufficient documentation

## 2014-07-23 DIAGNOSIS — R21 Rash and other nonspecific skin eruption: Secondary | ICD-10-CM | POA: Diagnosis present

## 2014-07-23 DIAGNOSIS — Z8579 Personal history of other malignant neoplasms of lymphoid, hematopoietic and related tissues: Secondary | ICD-10-CM | POA: Diagnosis not present

## 2014-07-23 DIAGNOSIS — Z923 Personal history of irradiation: Secondary | ICD-10-CM | POA: Insufficient documentation

## 2014-07-23 DIAGNOSIS — Z87891 Personal history of nicotine dependence: Secondary | ICD-10-CM | POA: Diagnosis not present

## 2014-07-23 MED ORDER — OXYCODONE-ACETAMINOPHEN 5-325 MG PO TABS
1.0000 | ORAL_TABLET | Freq: Once | ORAL | Status: AC
  Administered 2014-07-23: 1 via ORAL
  Filled 2014-07-23: qty 1

## 2014-07-23 MED ORDER — OXYCODONE-ACETAMINOPHEN 5-325 MG PO TABS: 1.0000 | ORAL_TABLET | ORAL | Status: DC | PRN

## 2014-07-23 MED ORDER — ACYCLOVIR 400 MG PO TABS: 800.0000 mg | ORAL_TABLET | Freq: Four times a day (QID) | ORAL | Status: DC

## 2014-07-23 NOTE — ED Notes (Signed)
Pt c/o rash to lower right side of back states it is painful as well. Rash has been there since Sunday. Pt has multiple myeloma and is on chemo pills.

## 2014-07-23 NOTE — Discharge Instructions (Signed)
Please follow the directions provided.  Be sure to call Dr. Jonette Eva regarding management of this shingles rash.  Please start taking the acyclovir 800 mg PO four times a day and the percocet for pain.  Please avoid contact with young children, elderly people, people with compromised immune systems.  Don't hesitate to return for any new, worsening or concerning symptoms.   SEEK IMMEDIATE MEDICAL CARE IF:  You have facial pain, pain around the eye area, or loss of feeling on one side of your face.  You have ear pain or ringing in your ear.  You have loss of taste.  Your pain is not relieved with prescribed medicines.  Your redness or swelling spreads.  You have more pain and swelling.  Your condition is worsening or has changed.  You have a fever.

## 2014-07-23 NOTE — ED Provider Notes (Signed)
CSN: [id number]     Arrival date & time 07/23/14  1629 History   First MD Initiated Contact with Patient 07/23/14 1650     Chief Complaint  Patient presents with  . Rash  . Back Pain   (Consider location/radiation/quality/duration/timing/severity/associated sxs/prior Treatment) HPI Mark [name] is a 43 yo male presenting with rash onset 3 days ago.  He reports he was unaware of the rash he just noticed he had back pain starting three days ago.  He reports he has a history of multiple myeloma and receives daily oral chemo.  He denies fevers, chills, nausea, vomiting, chest pain, shortness of breath.    Past Medical History  Diagnosis Date  . History of radiation therapy 02/07/13- 02/26/13    lower L spine, upper sacrum, 35 gray in 14 fractions  . Cancer   . Multiple myeloma dx'd 01/2013   Past Surgical History  Procedure Laterality Date  . Portacath placement    . Bone marrow transplant     Family History  Problem Relation Age of Onset  . Diabetic kidney disease Mother   . Hypertension Mother   . Heart attack Mother   . Kidney failure Mother   . Coronary artery disease Mother   . HIV Father    History  Substance Use Topics  . Smoking status: Former Smoker -- 1.00 packs/day for 15 years    Types: Cigarettes    Start date: 06/29/1988    Quit date: 06/29/2003  . Smokeless tobacco: Never Used     Comment: quit smoking 10 years ago  . Alcohol Use: No    Review of Systems  Constitutional: Negative for fever and chills.  HENT: Negative for sore throat.   Eyes: Negative for visual disturbance.  Respiratory: Negative for cough and shortness of breath.   Cardiovascular: Negative for chest pain and leg swelling.  Gastrointestinal: Negative for nausea, vomiting and diarrhea.  Genitourinary: Negative for dysuria.  Musculoskeletal: Negative for myalgias.  Skin: Positive for rash.  Neurological: Negative for weakness, numbness and headaches.      Allergies  Review of  patient's allergies indicates no known allergies.  Home Medications   Prior to Admission medications   Medication Sig Start Date End Date Taking? Authorizing Provider  acetaminophen (TYLENOL) 500 MG tablet Take 500 mg by mouth every 6 (six) hours as needed for moderate pain or headache.   Yes Historical Provider, MD  acyclovir (ZOVIRAX) 800 MG tablet Take 1 tablet (800 mg total) by mouth 2 (two) times daily. 06/06/14  Yes [name], MD  aspirin EC 325 MG tablet Take 1 tablet (325 mg total) by mouth daily. 02/28/14  Yes Coralee Pesa Wertman, PA-C  bisacodyl (DULCOLAX) 5 MG EC tablet Take 5 mg by mouth daily as needed for moderate constipation.   Yes Historical Provider, MD  calcium-vitamin D (OSCAL WITH D) 500-200 MG-UNIT per tablet Take 1 tablet by mouth daily with breakfast.   Yes Historical Provider, MD  gabapentin (NEURONTIN) 300 MG capsule Take 1-2 capsules (300-600 mg total) by mouth 3 (three) times daily. Take 395m in the morning and afternoon and take 6077min the evening 06/06/14  Yes PeVolanda NapoleonMD  HYDROcodone-ibuprofen (VICOPROFEN) 7.5-200 MG per tablet Take 1-2 tablets by mouth every 8 (eight) hours as needed for moderate pain. 06/12/14  Yes PeVolanda NapoleonMD  ibuprofen (ADVIL,MOTRIN) 200 MG tablet Take 400 mg by mouth every 6 (six) hours as needed (pain).    Yes Historical Provider, MD  lenalidomide (REVLIMID) 10 MG capsule Take 1 capsule (10 mg total) by mouth daily. Auth # [id number] 07/21/14  Yes [name], MD  prochlorperazine (COMPAZINE) 10 MG tablet Take 10 mg by mouth every 8 (eight) hours as needed for nausea or vomiting.   Yes Historical Provider, MD   BP 141/87 mmHg  Pulse 68  Temp(Src) 98.5 F (36.9 C) (Oral)  Resp 20  SpO2 100% Physical Exam  Constitutional: He is oriented to person, place, and time. He appears well-developed and well-nourished. No distress.  HENT:  Head: Normocephalic and atraumatic.  Mouth/Throat: Oropharynx is clear and moist. No  oropharyngeal exudate.  Eyes: Conjunctivae are normal.  Neck: Neck supple. No thyromegaly present.  Cardiovascular: Normal rate, regular rhythm and intact distal pulses.   Pulmonary/Chest: Effort normal and breath sounds normal. No respiratory distress. He has no rales.  Abdominal: Soft. There is no tenderness.  Musculoskeletal: He exhibits no tenderness.  Lymphadenopathy:    He has no cervical adenopathy.  Neurological: He is alert and oriented to person, place, and time.  Skin: Skin is warm and dry. Rash noted. Rash is vesicular. He is not diaphoretic.     Psychiatric: He has a normal mood and affect.  Nursing note and vitals reviewed.   ED Course  Procedures (including critical care time) Labs Review Labs Reviewed - No data to display  Imaging Review No results found.   EKG Interpretation None      MDM   Final diagnoses:  Shingles   43 yo with rash consistent with shingles. He is currently on acyclovir twice a day as prophylaxis for his regimen for multiple myeloma. Discussed case with Dr. Ralene Bathe.  Recommend increasing dose of acyclovir to 4 times, pain meds prescribed also. Pt instructed to call hem/onc MD in the am to arrange a follow-up appointment.  Pt is well-appearing, in no acute distress and vital signs are stable.  They appear safe to be discharged.  Return precautions provided.     Filed Vitals:   07/23/14 1635  BP: 141/87  Pulse: 68  Temp: 98.5 F (36.9 C)  TempSrc: Oral  Resp: 20  SpO2: 100%    Meds given in ED:  Medications  oxyCODONE-acetaminophen (PERCOCET/ROXICET) 5-325 MG per tablet 1 tablet (1 tablet Oral Given 07/23/14 1718)    Discharge Medication List as of 07/23/2014  5:42 PM    START taking these medications   Details  !! acyclovir (ZOVIRAX) 400 MG tablet Take 2 tablets (800 mg total) by mouth 4 (four) times daily., Starting 07/23/2014, Until Discontinued, Print    oxyCODONE-acetaminophen (PERCOCET/ROXICET) 5-325 MG per tablet Take  1 tablet by mouth every 4 (four) hours as needed for moderate pain or severe pain., Starting 07/23/2014, Until Discontinued, Print     !! - Potential duplicate medications found. Please discuss with provider.         Britt Bottom, NP 07/24/14 [place], MD 07/26/14 [phone]

## 2014-08-05 ENCOUNTER — Ambulatory Visit: Payer: Self-pay

## 2014-08-05 ENCOUNTER — Other Ambulatory Visit: Payer: Self-pay | Admitting: Lab

## 2014-08-05 ENCOUNTER — Ambulatory Visit: Payer: Self-pay | Admitting: Hematology & Oncology

## 2014-08-07 ENCOUNTER — Ambulatory Visit (HOSPITAL_BASED_OUTPATIENT_CLINIC_OR_DEPARTMENT_OTHER): Payer: BLUE CROSS/BLUE SHIELD | Admitting: Family

## 2014-08-07 ENCOUNTER — Encounter: Payer: Self-pay | Admitting: Family

## 2014-08-07 ENCOUNTER — Ambulatory Visit (HOSPITAL_BASED_OUTPATIENT_CLINIC_OR_DEPARTMENT_OTHER): Payer: BLUE CROSS/BLUE SHIELD

## 2014-08-07 ENCOUNTER — Ambulatory Visit (HOSPITAL_BASED_OUTPATIENT_CLINIC_OR_DEPARTMENT_OTHER): Payer: BLUE CROSS/BLUE SHIELD | Admitting: Lab

## 2014-08-07 ENCOUNTER — Other Ambulatory Visit: Payer: Self-pay | Admitting: *Deleted

## 2014-08-07 DIAGNOSIS — C9000 Multiple myeloma not having achieved remission: Secondary | ICD-10-CM

## 2014-08-07 DIAGNOSIS — Z9484 Stem cells transplant status: Secondary | ICD-10-CM

## 2014-08-07 DIAGNOSIS — Z5112 Encounter for antineoplastic immunotherapy: Secondary | ICD-10-CM

## 2014-08-07 LAB — CMP (CANCER CENTER ONLY)
ALBUMIN: 4.3 g/dL (ref 3.3–5.5)
ALT(SGPT): 15 U/L (ref 10–47)
AST: 25 U/L (ref 11–38)
Alkaline Phosphatase: 42 U/L (ref 26–84)
BUN: 11 mg/dL (ref 7–22)
CALCIUM: 8.4 mg/dL (ref 8.0–10.3)
CO2: 27 mEq/L (ref 18–33)
Chloride: 106 mEq/L (ref 98–108)
Creat: 1 mg/dl (ref 0.6–1.2)
GLUCOSE: 153 mg/dL — AB (ref 73–118)
Potassium: 4.5 mEq/L (ref 3.3–4.7)
Sodium: 144 mEq/L (ref 128–145)
TOTAL PROTEIN: 7.4 g/dL (ref 6.4–8.1)
Total Bilirubin: 2.4 mg/dl — ABNORMAL HIGH (ref 0.20–1.60)

## 2014-08-07 LAB — CBC WITH DIFFERENTIAL (CANCER CENTER ONLY)
BASO#: 0 10*3/uL (ref 0.0–0.2)
BASO%: 0.3 % (ref 0.0–2.0)
EOS%: 5.5 % (ref 0.0–7.0)
Eosinophils Absolute: 0.2 10*3/uL (ref 0.0–0.5)
HEMATOCRIT: 35.1 % — AB (ref 38.7–49.9)
HGB: 12 g/dL — ABNORMAL LOW (ref 13.0–17.1)
LYMPH#: 0.7 10*3/uL — ABNORMAL LOW (ref 0.9–3.3)
LYMPH%: 18.9 % (ref 14.0–48.0)
MCH: 32.2 pg (ref 28.0–33.4)
MCHC: 34.2 g/dL (ref 32.0–35.9)
MCV: 94 fL (ref 82–98)
MONO#: 0.3 10*3/uL (ref 0.1–0.9)
MONO%: 7.7 % (ref 0.0–13.0)
NEUT#: 2.5 10*3/uL (ref 1.5–6.5)
NEUT%: 67.6 % (ref 40.0–80.0)
PLATELETS: 147 10*3/uL (ref 145–400)
RBC: 3.73 10*6/uL — AB (ref 4.20–5.70)
RDW: 13.5 % (ref 11.1–15.7)
WBC: 3.7 10*3/uL — AB (ref 4.0–10.0)

## 2014-08-07 MED ORDER — ZOLEDRONIC ACID 4 MG/100ML IV SOLN
4.0000 mg | Freq: Once | INTRAVENOUS | Status: AC
Start: 2014-08-07 — End: 2014-08-07
  Administered 2014-08-07: 4 mg via INTRAVENOUS
  Filled 2014-08-07: qty 100

## 2014-08-07 MED ORDER — GABAPENTIN 300 MG PO CAPS: 300.0000 mg | ORAL_CAPSULE | Freq: Three times a day (TID) | ORAL | Status: DC

## 2014-08-07 MED ORDER — BORTEZOMIB CHEMO SQ INJECTION 3.5 MG (2.5MG/ML)
1.3000 mg/m2 | Freq: Once | INTRAMUSCULAR | Status: AC
  Administered 2014-08-07: 2.75 mg via SUBCUTANEOUS
  Filled 2014-08-07: qty 2.75

## 2014-08-07 MED ORDER — ONDANSETRON HCL 8 MG PO TABS
8.0000 mg | ORAL_TABLET | Freq: Once | ORAL | Status: AC
  Administered 2014-08-07: 8 mg via ORAL

## 2014-08-07 MED ORDER — ONDANSETRON HCL 8 MG PO TABS
ORAL_TABLET | ORAL | Status: AC
Start: 2014-08-07 — End: 2014-08-07
  Filled 2014-08-07: qty 1

## 2014-08-07 NOTE — Progress Notes (Signed)
[facility]  [phone] [phone]) [phone]  ID: Mark [name] OB: 1971-04-19 MR#: [id number] TDD#:[id number] Patient Care Team: No Pcp Per Patient as PCP - General (General Practice)  DIAGNOSIS: Kappa light chain myeloma  INTERVAL HISTORY: Mark [name] is here today for follow-up. He is feeling better. His only issue is the chronic lower back pain and also the numbness and tingling in his feet is now moving up his legs towards his knees.  He denies  Fever, chills, n/v, blurred vision, headache, dizziness, constipation, diarrhea, rash, SOB, chest pain, palpitations, abdominal pain, problems urinating or blood in urine or stool.  No swelling or tenderness in his extremities.   His appetite is good and he is drinking plenty of fluids.  His MRI in November showed some disc bulging into the left L4-L5 space which could be affecting the left L4 nerve. In December, his kappa light chain was 2.92 M-spike was 0.26.   CURRENT THERAPY: Revlimid $RemoveBeforeDEI'10mg'sRCGZqNwOqYMvNum$  po q day (21/7) Zometa 4 mg IV q. month  Velcade q 3 week dosing  REVIEW OF SYSTEMS: All other 10 point review of systems is negative except for those issues mentioned above.   PAST MEDICAL HISTORY: Past Medical History  Diagnosis Date  . History of radiation therapy 02/07/13- 02/26/13    lower L spine, upper sacrum, 35 gray in 14 fractions  . Cancer   . Multiple myeloma dx'd 01/2013   PAST SURGICAL HISTORY: Past Surgical History  Procedure Laterality Date  . Portacath placement    . Bone marrow transplant     FAMILY HISTORY Family History  Problem Relation Age of Onset  . Diabetic kidney disease Mother   . Hypertension Mother   . Heart attack Mother   . Kidney failure Mother   . Coronary artery disease Mother   . HIV Father    GYNECOLOGIC HISTORY:  No LMP for male patient.   SOCIAL HISTORY:  History   Social History  . Marital Status: Married    Spouse Name: N/A    Number of Children: N/A  . Years of  Education: N/A   Occupational History  . Not on file.   Social History Main Topics  . Smoking status: Former Smoker -- 1.00 packs/day for 15 years    Types: Cigarettes    Start date: 06/29/1988    Quit date: 06/29/2003  . Smokeless tobacco: Never Used     Comment: quit smoking 10 years ago  . Alcohol Use: No  . Drug Use: No  . Sexual Activity: No   Other Topics Concern  . Not on file   Social History Narrative   ADVANCED DIRECTIVES: <no information>  HEALTH MAINTENANCE: History  Substance Use Topics  . Smoking status: Former Smoker -- 1.00 packs/day for 15 years    Types: Cigarettes    Start date: 06/29/1988    Quit date: 06/29/2003  . Smokeless tobacco: Never Used     Comment: quit smoking 10 years ago  . Alcohol Use: No   Colonoscopy: PAP: Bone density: Lipid panel:  No Known Allergies  Current Outpatient Prescriptions  Medication Sig Dispense Refill  . acetaminophen (TYLENOL) 500 MG tablet Take 500 mg by mouth every 6 (six) hours as needed for moderate pain or headache.    Marland Kitchen acyclovir (ZOVIRAX) 800 MG tablet Take 1 tablet (800 mg total) by mouth 2 (two) times daily. 60 tablet 0  . aspirin EC 325 MG tablet Take 1 tablet (325 mg total) by mouth daily. 100  tablet 3  . bisacodyl (DULCOLAX) 5 MG EC tablet Take 5 mg by mouth daily as needed for moderate constipation.    . calcium-vitamin D (OSCAL WITH D) 500-200 MG-UNIT per tablet Take 1 tablet by mouth daily with breakfast.    . gabapentin (NEURONTIN) 300 MG capsule Take 1-2 capsules (300-600 mg total) by mouth 3 (three) times daily. Take 328m in the morning and afternoon and take 6031min the evening 120 capsule 3  . HYDROcodone-ibuprofen (VICOPROFEN) 7.5-200 MG per tablet Take 1-2 tablets by mouth every 8 (eight) hours as needed for moderate pain. 120 tablet 0  . ibuprofen (ADVIL,MOTRIN) 200 MG tablet Take 400 mg by mouth every 6 (six) hours as needed (pain).     . Marland Kitchenenalidomide (REVLIMID) 10 MG capsule Take 1  capsule (10 mg total) by mouth daily. Auth # [id number] capsule 0  . prochlorperazine (COMPAZINE) 10 MG tablet Take 10 mg by mouth every 8 (eight) hours as needed for nausea or vomiting.     No current facility-administered medications for this visit.   OBJECTIVE: Filed Vitals:   08/07/14 1218  BP: 136/90  Pulse: 101  Temp: 98.1 F (36.7 C)  Resp: 18   Body mass index is 30.13 kg/(m^2). ECOG FS:1 - Symptomatic but completely ambulatory Ocular: Sclerae unicteric, pupils equal, round and reactive to light Ear-nose-throat: Oropharynx clear, dentition fair Lymphatic: No cervical or supraclavicular adenopathy Lungs no rales or rhonchi, good excursion bilaterally Heart regular rate and rhythm, no murmur appreciated Abd soft, nontender, positive bowel sounds MSK no focal spinal tenderness, no joint edema Neuro: non-focal, well-oriented, appropriate affect  LAB RESULTS: CMP     Component Value Date/Time   NA 144 08/07/2014 1201   NA 139 05/20/2014 0711   NA 145 02/21/2013 0908   K 4.5 08/07/2014 1201   K 4.3 05/20/2014 0711   K 3.8 02/21/2013 0908   CL 106 08/07/2014 1201   CL 104 05/20/2014 0711   CO2 27 08/07/2014 1201   CO2 24 05/20/2014 0711   CO2 32* 02/21/2013 0908   GLUCOSE 153* 08/07/2014 1201   GLUCOSE 96 05/20/2014 0711   GLUCOSE 115 02/21/2013 0908   BUN 11 08/07/2014 1201   BUN 14 05/20/2014 0711   BUN 19.5 02/21/2013 0908   CREATININE 1.0 08/07/2014 1201   CREATININE 1.08 05/20/2014 0711   CREATININE 1.5* 02/21/2013 0908   CALCIUM 8.4 08/07/2014 1201   CALCIUM 8.4 05/20/2014 0711   CALCIUM 17.7 Repeated and Verified* 02/21/2013 0908   CALCIUM >15.0* 02/01/2013 1230   PROT 7.4 08/07/2014 1201   PROT 6.9 04/03/2014 1008   ALBUMIN 4.3 04/03/2014 1008   AST 25 08/07/2014 1201   AST 22 04/03/2014 1008   ALT 15 08/07/2014 1201   ALT 16 04/03/2014 1008   ALKPHOS 42 08/07/2014 1201   ALKPHOS 48 04/03/2014 1008   BILITOT 2.40* 08/07/2014 1201   BILITOT 2.0*  04/03/2014 1008   GFRNONAA 84* 05/20/2014 0711   GFRAA >90 05/20/2014 0711   No results found for: SPEP Lab Results  Component Value Date   WBC 3.7* 08/07/2014   NEUTROABS 2.5 08/07/2014   HGB 12.0* 08/07/2014   HCT 35.1* 08/07/2014   MCV 94 08/07/2014   PLT 147 08/07/2014   No results found for: LABCA2 No components found for: LATIRWE315o results for input(s): INR in the last 168 hours.  STUDIES:  ASSESSMENT/PLAN: Mr. KiHazzards [age]ear old African American male with kappa light chain myeloma. He did undergo stem cell  transplant for his myeloma on September 11, 2013.  He is calling today to make his 1 year appointment.   His WBC are up to 3.7 Hgb 12.0 and MCV 94.   He will stop taking the Revlemid for now. Dr. Marin Olp will re-evaluate him at his next appointment. We will get another MRI or his lumbar spine.  He has his appointment and treatment schedule.  He knows to call here with any questions or concerns and to go to the ED in the event of an emergency. We can certainly see him sooner if need be.   [name], NP 08/07/2014 1:24 PM

## 2014-08-07 NOTE — Patient Instructions (Signed)
Zoledronic Acid injection (Hypercalcemia, Oncology) What is this medicine? ZOLEDRONIC ACID (ZOE le dron ik AS id) lowers the amount of calcium loss from bone. It is used to treat too much calcium in your blood from cancer. It is also used to prevent complications of cancer that has spread to the bone. This medicine may be used for other purposes; ask your health care provider or pharmacist if you have questions. COMMON BRAND NAME(S): Zometa What should I tell my health care provider before I take this medicine? They need to know if you have any of these conditions: -aspirin-sensitive asthma -cancer, especially if you are receiving medicines used to treat cancer -dental disease or wear dentures -infection -kidney disease -receiving corticosteroids like dexamethasone or prednisone -an unusual or allergic reaction to zoledronic acid, other medicines, foods, dyes, or preservatives -pregnant or trying to get pregnant -breast-feeding How should I use this medicine? This medicine is for infusion into a vein. It is given by a health care professional in a hospital or clinic setting. Talk to your pediatrician regarding the use of this medicine in children. Special care may be needed. Overdosage: If you think you have taken too much of this medicine contact a poison control center or emergency room at once. NOTE: This medicine is only for you. Do not share this medicine with others. What if I miss a dose? It is important not to miss your dose. Call your doctor or health care professional if you are unable to keep an appointment. What may interact with this medicine? -certain antibiotics given by injection -NSAIDs, medicines for pain and inflammation, like ibuprofen or naproxen -some diuretics like bumetanide, furosemide -teriparatide -thalidomide This list may not describe all possible interactions. Give your health care provider a list of all the medicines, herbs, non-prescription drugs, or  dietary supplements you use. Also tell them if you smoke, drink alcohol, or use illegal drugs. Some items may interact with your medicine. What should I watch for while using this medicine? Visit your doctor or health care professional for regular checkups. It may be some time before you see the benefit from this medicine. Do not stop taking your medicine unless your doctor tells you to. Your doctor may order blood tests or other tests to see how you are doing. Women should inform their doctor if they wish to become pregnant or think they might be pregnant. There is a potential for serious side effects to an unborn child. Talk to your health care professional or pharmacist for more information. You should make sure that you get enough calcium and vitamin D while you are taking this medicine. Discuss the foods you eat and the vitamins you take with your health care professional. Some people who take this medicine have severe bone, joint, and/or muscle pain. This medicine may also increase your risk for jaw problems or a broken thigh bone. Tell your doctor right away if you have severe pain in your jaw, bones, joints, or muscles. Tell your doctor if you have any pain that does not go away or that gets worse. Tell your dentist and dental surgeon that you are taking this medicine. You should not have major dental surgery while on this medicine. See your dentist to have a dental exam and fix any dental problems before starting this medicine. Take good care of your teeth while on this medicine. Make sure you see your dentist for regular follow-up appointments. What side effects may I notice from receiving this medicine? Side effects that  you should report to your doctor or health care professional as soon as possible: -allergic reactions like skin rash, itching or hives, swelling of the face, lips, or tongue -anxiety, confusion, or depression -breathing problems -changes in vision -eye pain -feeling faint or  lightheaded, falls -jaw pain, especially after dental work -mouth sores -muscle cramps, stiffness, or weakness -trouble passing urine or change in the amount of urine Side effects that usually do not require medical attention (report to your doctor or health care professional if they continue or are bothersome): -bone, joint, or muscle pain -constipation -diarrhea -fever -hair loss -irritation at site where injected -loss of appetite -nausea, vomiting -stomach upset -trouble sleeping -trouble swallowing -weak or tired This list may not describe all possible side effects. Call your doctor for medical advice about side effects. You may report side effects to FDA at 1-800-FDA-1088. Where should I keep my medicine? This drug is given in a hospital or clinic and will not be stored at home. NOTE: This sheet is a summary. It may not cover all possible information. If you have questions about this medicine, talk to your doctor, pharmacist, or health care provider.  2015, Elsevier/Gold Standard. (2012-12-27 13:03:13)  Bortezomib injection What is this medicine? BORTEZOMIB (bor TEZ oh mib) is a chemotherapy drug. It slows the growth of cancer cells. This medicine is used to treat multiple myeloma, and certain lymphomas, such as mantle-cell lymphoma. This medicine may be used for other purposes; ask your health care provider or pharmacist if you have questions. COMMON BRAND NAME(S): Velcade What should I tell my health care provider before I take this medicine? They need to know if you have any of these conditions: -diabetes -heart disease -irregular heartbeat -liver disease -on hemodialysis -low blood counts, like low white blood cells, platelets, or hemoglobin -peripheral neuropathy -taking medicine for blood pressure -an unusual or allergic reaction to bortezomib, mannitol, boron, other medicines, foods, dyes, or preservatives -pregnant or trying to get pregnant -breast-feeding How  should I use this medicine? This medicine is for injection into a vein or for injection under the skin. It is given by a health care professional in a hospital or clinic setting. Talk to your pediatrician regarding the use of this medicine in children. Special care may be needed. Overdosage: If you think you have taken too much of this medicine contact a poison control center or emergency room at once. NOTE: This medicine is only for you. Do not share this medicine with others. What if I miss a dose? It is important not to miss your dose. Call your doctor or health care professional if you are unable to keep an appointment. What may interact with this medicine? This medicine may interact with the following medications: -ketoconazole -rifampin -ritonavir -St. John's Wort This list may not describe all possible interactions. Give your health care provider a list of all the medicines, herbs, non-prescription drugs, or dietary supplements you use. Also tell them if you smoke, drink alcohol, or use illegal drugs. Some items may interact with your medicine. What should I watch for while using this medicine? Visit your doctor for checks on your progress. This drug may make you feel generally unwell. This is not uncommon, as chemotherapy can affect healthy cells as well as cancer cells. Report any side effects. Continue your course of treatment even though you feel ill unless your doctor tells you to stop. You may get drowsy or dizzy. Do not drive, use machinery, or do anything that needs mental   until you know how this medicine affects you. Do not stand or sit up quickly, especially if you are an older patient. This reduces the risk of dizzy or fainting spells. In some cases, you may be given additional medicines to help with side effects. Follow all directions for their use. Call your doctor or health care professional for advice if you get a fever, chills or sore throat, or other symptoms of a  cold or flu. Do not treat yourself. This drug decreases your body's ability to fight infections. Try to avoid being around people who are sick. This medicine may increase your risk to bruise or bleed. Call your doctor or health care professional if you notice any unusual bleeding. You may need blood work done while you are taking this medicine. In some patients, this medicine may cause a serious brain infection that may cause death. If you have any problems seeing, thinking, speaking, walking, or standing, tell your doctor right away. If you cannot reach your doctor, urgently seek other source of medical care. Do not become pregnant while taking this medicine. Women should inform their doctor if they wish to become pregnant or think they might be pregnant. There is a potential for serious side effects to an unborn child. Talk to your health care professional or pharmacist for more information. Do not breast-feed an infant while taking this medicine. Check with your doctor or health care professional if you get an attack of severe diarrhea, nausea and vomiting, or if you sweat a lot. The loss of too much body fluid can make it dangerous for you to take this medicine. What side effects may I notice from receiving this medicine? Side effects that you should report to your doctor or health care professional as soon as possible: -allergic reactions like skin rash, itching or hives, swelling of the face, lips, or tongue -breathing problems -changes in hearing -changes in vision -fast, irregular heartbeat -feeling faint or lightheaded, falls -pain, tingling, numbness in the hands or feet -right upper belly pain -seizures -swelling of the ankles, feet, hands -unusual bleeding or bruising -unusually weak or tired -vomiting -yellowing of the eyes or skin Side effects that usually do not require medical attention (report to your doctor or health care professional if they continue or are  bothersome): -changes in emotions or moods -constipation -diarrhea -loss of appetite -headache -irritation at site where injected -nausea This list may not describe all possible side effects. Call your doctor for medical advice about side effects. You may report side effects to FDA at 1-800-FDA-1088. Where should I keep my medicine? This drug is given in a hospital or clinic and will not be stored at home. NOTE: This sheet is a summary. It may not cover all possible information. If you have questions about this medicine, talk to your doctor, pharmacist, or health care provider.  2015, Elsevier/Gold Standard. (2013-05-13 12:46:32)

## 2014-08-11 LAB — PROTEIN ELECTROPHORESIS, SERUM, WITH REFLEX
ALBUMIN ELP: 60.5 % (ref 55.8–66.1)
ALPHA-1-GLOBULIN: 4 % (ref 2.9–4.9)
Alpha-2-Globulin: 7.8 % (ref 7.1–11.8)
Beta 2: 4.4 % (ref 3.2–6.5)
Beta Globulin: 5.4 % (ref 4.7–7.2)
GAMMA GLOBULIN: 17.9 % (ref 11.1–18.8)
M-Spike, %: 0.21 g/dL
TOTAL PROTEIN, SERUM ELECTROPHOR: 6.8 g/dL (ref 6.0–8.3)

## 2014-08-11 LAB — IFE INTERPRETATION

## 2014-08-11 LAB — IGG, IGA, IGM
IGM, SERUM: 16 mg/dL — AB (ref 41–251)
IgA: 112 mg/dL (ref 68–379)
IgG (Immunoglobin G), Serum: 1310 mg/dL (ref 650–1600)

## 2014-08-11 LAB — KAPPA/LAMBDA LIGHT CHAINS
KAPPA LAMBDA RATIO: 1.05 (ref 0.26–1.65)
Kappa free light chain: 2.81 mg/dL — ABNORMAL HIGH (ref 0.33–1.94)
LAMBDA FREE LGHT CHN: 2.67 mg/dL — AB (ref 0.57–2.63)

## 2014-08-11 LAB — LACTATE DEHYDROGENASE: LDH: 194 U/L (ref 94–250)

## 2014-08-26 ENCOUNTER — Ambulatory Visit: Payer: Self-pay

## 2014-08-26 ENCOUNTER — Other Ambulatory Visit: Payer: Self-pay | Admitting: Lab

## 2014-08-26 ENCOUNTER — Ambulatory Visit: Payer: Self-pay | Admitting: Hematology & Oncology

## 2014-08-28 ENCOUNTER — Encounter: Payer: Self-pay | Admitting: Hematology & Oncology

## 2014-08-28 ENCOUNTER — Ambulatory Visit: Payer: Self-pay

## 2014-08-28 ENCOUNTER — Other Ambulatory Visit: Payer: Self-pay | Admitting: *Deleted

## 2014-08-28 ENCOUNTER — Ambulatory Visit (HOSPITAL_COMMUNITY)
Admission: RE | Admit: 2014-08-28 | Discharge: 2014-08-28 | Disposition: A | Discharge status: Home / Self Care | Payer: BLUE CROSS/BLUE SHIELD | Source: Ambulatory Visit | Attending: Family | Admitting: Family

## 2014-08-28 ENCOUNTER — Ambulatory Visit (HOSPITAL_BASED_OUTPATIENT_CLINIC_OR_DEPARTMENT_OTHER): Payer: BLUE CROSS/BLUE SHIELD | Admitting: Hematology & Oncology

## 2014-08-28 ENCOUNTER — Telehealth: Payer: Self-pay | Admitting: Hematology & Oncology

## 2014-08-28 ENCOUNTER — Ambulatory Visit (HOSPITAL_BASED_OUTPATIENT_CLINIC_OR_DEPARTMENT_OTHER): Payer: BLUE CROSS/BLUE SHIELD | Admitting: Lab

## 2014-08-28 VITALS — BP 129/84 | HR 81 | Temp 97.5°F | Resp 18 | Ht 70.0 in | Wt 208.0 lb

## 2014-08-28 DIAGNOSIS — C9000 Multiple myeloma not having achieved remission: Secondary | ICD-10-CM | POA: Diagnosis present

## 2014-08-28 DIAGNOSIS — M545 Low back pain: Secondary | ICD-10-CM | POA: Insufficient documentation

## 2014-08-28 DIAGNOSIS — M79606 Pain in leg, unspecified: Secondary | ICD-10-CM | POA: Insufficient documentation

## 2014-08-28 DIAGNOSIS — Z923 Personal history of irradiation: Secondary | ICD-10-CM | POA: Insufficient documentation

## 2014-08-28 DIAGNOSIS — M4806 Spinal stenosis, lumbar region: Secondary | ICD-10-CM

## 2014-08-28 DIAGNOSIS — M479 Spondylosis, unspecified: Secondary | ICD-10-CM | POA: Insufficient documentation

## 2014-08-28 DIAGNOSIS — M48061 Spinal stenosis, lumbar region without neurogenic claudication: Secondary | ICD-10-CM

## 2014-08-28 LAB — CBC WITH DIFFERENTIAL (CANCER CENTER ONLY)
BASO#: 0 10*3/uL (ref 0.0–0.2)
BASO%: 0 % (ref 0.0–2.0)
EOS%: 5.4 % (ref 0.0–7.0)
Eosinophils Absolute: 0.1 10*3/uL (ref 0.0–0.5)
HCT: 33.8 % — ABNORMAL LOW (ref 38.7–49.9)
HGB: 11.5 g/dL — ABNORMAL LOW (ref 13.0–17.1)
LYMPH#: 0.9 10*3/uL (ref 0.9–3.3)
LYMPH%: 37.5 % (ref 14.0–48.0)
MCH: 32.1 pg (ref 28.0–33.4)
MCHC: 34 g/dL (ref 32.0–35.9)
MCV: 94 fL (ref 82–98)
MONO#: 0.3 10*3/uL (ref 0.1–0.9)
MONO%: 10.4 % (ref 0.0–13.0)
NEUT#: 1.1 10*3/uL — ABNORMAL LOW (ref 1.5–6.5)
NEUT%: 46.7 % (ref 40.0–80.0)
Platelets: 119 10*3/uL — ABNORMAL LOW (ref 145–400)
RBC: 3.58 10*6/uL — AB (ref 4.20–5.70)
RDW: 13.4 % (ref 11.1–15.7)
WBC: 2.4 10*3/uL — ABNORMAL LOW (ref 4.0–10.0)

## 2014-08-28 LAB — CMP (CANCER CENTER ONLY)
ALBUMIN: 4 g/dL (ref 3.3–5.5)
ALT(SGPT): 9 U/L — ABNORMAL LOW (ref 10–47)
AST: 24 U/L (ref 11–38)
Alkaline Phosphatase: 37 U/L (ref 26–84)
BUN: 13 mg/dL (ref 7–22)
CO2: 24 mEq/L (ref 18–33)
Calcium: 8.8 mg/dL (ref 8.0–10.3)
Chloride: 107 mEq/L (ref 98–108)
Creat: 1.1 mg/dl (ref 0.6–1.2)
Glucose, Bld: 98 mg/dL (ref 73–118)
POTASSIUM: 3.8 meq/L (ref 3.3–4.7)
Sodium: 143 mEq/L (ref 128–145)
TOTAL PROTEIN: 7.3 g/dL (ref 6.4–8.1)
Total Bilirubin: 2.1 mg/dl — ABNORMAL HIGH (ref 0.20–1.60)

## 2014-08-28 MED ORDER — GADOBENATE DIMEGLUMINE 529 MG/ML IV SOLN
20.0000 mL | Freq: Once | INTRAVENOUS | Status: AC | PRN
  Administered 2014-08-28: 20 mL via INTRAVENOUS

## 2014-08-28 MED ORDER — LENALIDOMIDE 10 MG PO CAPS: 10.0000 mg | ORAL_CAPSULE | Freq: Every day | ORAL | Status: DC

## 2014-08-28 MED ORDER — OXYCODONE-ACETAMINOPHEN 10-325 MG PO TABS: 1.0000 | ORAL_TABLET | ORAL | Status: DC | PRN

## 2014-08-28 NOTE — Progress Notes (Signed)
No treatment today per dr. ennever 

## 2014-08-28 NOTE — Telephone Encounter (Signed)
Andee Poles will contact pt for myelogram next week

## 2014-08-28 NOTE — Progress Notes (Signed)
Hematology and Oncology Follow Up Visit  Mark [name] [id number] [birth year] 44 y.o. 08/28/2014   Principle Diagnosis:   Kappa light chain myeloma  Current Therapy:   Revlimid 10mg  po q day (21/7) - to be on hold Zometa 4 mg IV q. month Velcade q 3 week dosing - to be on hold     Interim History:  Mark [name] is back for followup. He is not doing all that well. He's having more problems with neuropathy in his legs. I really think that this is more so from his spine and from the chemotherapy. However, I want him to stop the Revlimid and Velcade for right now.  He had MRI of the lumbar spine done recently. He has severe spinal stenosis in the lumbar spine. There is likely encroachment upon the right L5 nerve root. He has a plasmacytoma that is stable but again this looks it might be encroaching upon a nerve root.  There is no fracture. He does have a stable pathologic fracture at L2 that has been kyphoplasty.  His pain medicine is not helping much. He is on Vicoprofen. I will switch him over to Percocet.  I think that he might need surgery. I suspect that he has severe spinal stenosis. His heart from the sleep. His heart from the work. He has no weakness just the numbness and tingling.  He is on Neurontin but this really is not helping.  I want to have to see about getting a CT myelogram done. I think this might give [place] some more information. This will be important. I will then probably every refer him to either neurosurgery or orthopedic spinal surgery.    His last myeloma studies back in early January showed a Kappa  Light chain of 2.81. His M spike was 0.21 g/L.   Overall, his performance status is ECOG 1.  Medications:  Current outpatient prescriptions:  .  acetaminophen (TYLENOL) 500 MG tablet, Take 500 mg by mouth every 6 (six) hours as needed for moderate pain or headache., Disp: , Rfl:  .  acyclovir (ZOVIRAX) 800 MG tablet, Take 1 tablet (800 mg total) by mouth 2 (two) times  daily., Disp: 60 tablet, Rfl: 0 .  aspirin EC 325 MG tablet, Take 1 tablet (325 mg total) by mouth daily., Disp: 100 tablet, Rfl: 3 .  bisacodyl (DULCOLAX) 5 MG EC tablet, Take 5 mg by mouth daily as needed for moderate constipation., Disp: , Rfl:  .  calcium-vitamin D (OSCAL WITH D) 500-200 MG-UNIT per tablet, Take 1 tablet by mouth daily with breakfast., Disp: , Rfl:  .  ibuprofen (ADVIL,MOTRIN) 200 MG tablet, Take 400 mg by mouth every 6 (six) hours as needed (pain). , Disp: , Rfl:  .  lenalidomide (REVLIMID) 10 MG capsule, Take 1 capsule (10 mg total) by mouth daily. Josem Kaufmann #[id number], Disp: 28 capsule, Rfl: 0 .  prochlorperazine (COMPAZINE) 10 MG tablet, Take 10 mg by mouth every 8 (eight) hours as needed for nausea or vomiting., Disp: , Rfl:  .  oxyCODONE-acetaminophen (PERCOCET) 10-325 MG per tablet, Take 1 tablet by mouth every 4 (four) hours as needed for pain., Disp: 120 tablet, Rfl: 0  Allergies: No Known Allergies  Past Medical History, Surgical history, Social history, and Family History were reviewed and updated.  Review of Systems: As above  Physical Exam:  height is 5\' 10"  (1.778 m) and weight is 208 lb (94.348 kg). His oral temperature is 97.5 F (36.4 C). His blood pressure is 129/84  and his pulse is 81. His respiration is 18.   Ill-appearing African American gentleman. He is alert and oriented x3. Vital signs are stable. Head and neck exam shows no ocular or oral lesions. He has no palpable cervical or supraclavicular lymph nodes. Oral cavity is totally dry. Lungs are clear. Cardiac exam regular rate and rhythm with no murmurs rubs or bruits. Abdomen is soft. He has good bowel sounds. There is no fluid wave. There is no palpable liver or spleen tip. Back exam shows no tenderness over the spine ribs or hips. Extremities shows no clubbing cyanosis or edema.  He has good range of motion of his knees. There is no swelling of his knee joint. Skin exam no rashes. Neurological exam is  nonfocal. He has good strength in his legs and feet. Massive slight decrease to sensation in his feet and lower legs. There is no weakness.  Lab Results  Component Value Date   WBC 2.4* 08/28/2014   HGB 11.5* 08/28/2014   HCT 33.8* 08/28/2014   MCV 94 08/28/2014   PLT 119* 08/28/2014     Chemistry      Component Value Date/Time   NA 143 08/28/2014 1043   NA 139 05/20/2014 0711   NA 145 02/21/2013 0908   K 3.8 08/28/2014 1043   K 4.3 05/20/2014 0711   K 3.8 02/21/2013 0908   CL 107 08/28/2014 1043   CL 104 05/20/2014 0711   CO2 24 08/28/2014 1043   CO2 24 05/20/2014 0711   CO2 32* 02/21/2013 0908   BUN 13 08/28/2014 1043   BUN 14 05/20/2014 0711   BUN 19.5 02/21/2013 0908   CREATININE 1.1 08/28/2014 1043   CREATININE 1.08 05/20/2014 0711   CREATININE 1.5* 02/21/2013 0908      Component Value Date/Time   CALCIUM 8.8 08/28/2014 1043   CALCIUM 8.4 05/20/2014 0711   CALCIUM 17.7 Repeated and Verified* 02/21/2013 0908   CALCIUM >15.0* 02/01/2013 1230   ALKPHOS 37 08/28/2014 1043   ALKPHOS 48 04/03/2014 1008   AST 24 08/28/2014 1043   AST 22 04/03/2014 1008   ALT 9* 08/28/2014 1043   ALT 16 04/03/2014 1008   BILITOT 2.10* 08/28/2014 1043   BILITOT 2.0* 04/03/2014 1008         Impression and Plan: Mark [name] is 44 year old -year-old African American male. 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 This was in January or early February. This was done at [facility].  For now, we will put a hold on the Velcade and Revlimid.  We really have to be aggressive, I think, in trying to resolve this neurological issue. Again, to me, I think that this is all reflective of his spinal stenosis. I think the chemotherapy only has 10% chance of causing neuropathy.  We will see what the myelogram shows. Again, I think that he probably is going to need surgery.  I will hold on the Zometa for right now.  I want to see him back in one month.  I spent about 40 minutes  with he and his wife. This is a very serious situation that we have to be very aggressive in dealing with.  [name], MD 1/28/20165:42 PM

## 2014-09-01 LAB — PROTEIN ELECTROPHORESIS, SERUM, WITH REFLEX
Albumin ELP: 60.4 % (ref 55.8–66.1)
Alpha-1-Globulin: 4.1 % (ref 2.9–4.9)
Alpha-2-Globulin: 8.3 % (ref 7.1–11.8)
Beta 2: 4.6 % (ref 3.2–6.5)
Beta Globulin: 5.1 % (ref 4.7–7.2)
GAMMA GLOBULIN: 17.5 % (ref 11.1–18.8)
M-Spike, %: 0.21 g/dL
Total Protein, Serum Electrophoresis: 7 g/dL (ref 6.0–8.3)

## 2014-09-01 LAB — KAPPA/LAMBDA LIGHT CHAINS
Kappa free light chain: 2.71 mg/dL — ABNORMAL HIGH (ref 0.33–1.94)
Kappa:Lambda Ratio: 1.05 (ref 0.26–1.65)
Lambda Free Lght Chn: 2.59 mg/dL (ref 0.57–2.63)

## 2014-09-01 LAB — IGG, IGA, IGM
IGA: 105 mg/dL (ref 68–379)
IgG (Immunoglobin G), Serum: 1320 mg/dL (ref 650–1600)
IgM, Serum: 15 mg/dL — ABNORMAL LOW (ref 41–251)

## 2014-09-01 LAB — LACTATE DEHYDROGENASE: LDH: 198 U/L (ref 94–250)

## 2014-09-01 LAB — IFE INTERPRETATION

## 2014-09-04 ENCOUNTER — Other Ambulatory Visit: Payer: Self-pay | Admitting: Family

## 2014-09-04 ENCOUNTER — Ambulatory Visit
Admission: RE | Admit: 2014-09-04 | Discharge: 2014-09-04 | Disposition: A | Discharge status: Home / Self Care | Payer: BLUE CROSS/BLUE SHIELD | Source: Ambulatory Visit | Attending: Hematology & Oncology | Admitting: Hematology & Oncology

## 2014-09-04 DIAGNOSIS — M48061 Spinal stenosis, lumbar region without neurogenic claudication: Secondary | ICD-10-CM

## 2014-09-04 DIAGNOSIS — C9000 Multiple myeloma not having achieved remission: Secondary | ICD-10-CM

## 2014-09-04 MED ORDER — ONDANSETRON HCL 4 MG/2ML IJ SOLN: 4.0000 mg | Freq: Four times a day (QID) | INTRAMUSCULAR | Status: DC | PRN

## 2014-09-04 MED ORDER — DIAZEPAM 5 MG PO TABS
10.0000 mg | ORAL_TABLET | Freq: Once | ORAL | Status: AC
Start: 2014-09-04 — End: 2014-09-04
  Administered 2014-09-04: 10 mg via ORAL

## 2014-09-04 MED ORDER — IOHEXOL 180 MG/ML  SOLN
15.0000 mL | Freq: Once | INTRAMUSCULAR | Status: AC | PRN
  Administered 2014-09-04: 15 mL via INTRATHECAL

## 2014-09-04 NOTE — Discharge Instructions (Signed)

## 2014-09-10 ENCOUNTER — Other Ambulatory Visit: Payer: Self-pay | Admitting: Orthopedic Surgery

## 2014-09-16 ENCOUNTER — Ambulatory Visit: Payer: Self-pay

## 2014-09-16 ENCOUNTER — Other Ambulatory Visit: Payer: Self-pay | Admitting: Lab

## 2014-09-16 ENCOUNTER — Ambulatory Visit: Payer: Self-pay | Admitting: Hematology & Oncology

## 2014-09-17 ENCOUNTER — Telehealth: Payer: Self-pay | Admitting: Hematology & Oncology

## 2014-09-17 ENCOUNTER — Other Ambulatory Visit (HOSPITAL_COMMUNITY): Payer: Self-pay | Admitting: *Deleted

## 2014-09-17 NOTE — Telephone Encounter (Signed)
Pt called moved 2-18 to 2-22

## 2014-09-17 NOTE — Pre-Procedure Instructions (Addendum)
Mark [name]  09/17/2014   Your procedure is scheduled on:  Thursday, September 25, 2014 at 1:00 PM.   Report to [facility] Entrance "A" Admitting Office at 11:00 AM.   Call this number if you have problems the morning of surgery: [phone]                 Any questions prior to day of surgery, please call [phone] between 8 & 4 PM.   Remember:   Do not eat food or drink liquids after midnight Wednesday, 2.24.16.   Take these medicines the morning of surgery with A SIP OF WATER: tylenol or percocet  if needed, acyclovir, gabapentin, compazine if needed  Stop Aspirin and Vitamins as of today.   Do not wear jewelry.  Do not wear lotions, powders, or cologne. You may wear deodorant.  Men may shave face and neck.  Do not bring valuables to the hospital.  [facility] is not responsible                  for any belongings or valuables.               Contacts, dentures or bridgework may not be worn into surgery.  Leave suitcase in the car. After surgery it may be brought to your room.  For patients admitted to the hospital, discharge time is determined by your                treatment team.               Patients discharged the day of surgery will not be allowed to drive home.    Special Instructions: See "Preparing for Surgery" instruction sheet.   Please read over the following fact sheets that you were given: Pain Booklet, Coughing and Deep Breathing, MRSA Information and Surgical Site Infection Prevention

## 2014-09-18 ENCOUNTER — Ambulatory Visit: Payer: Self-pay | Admitting: Hematology & Oncology

## 2014-09-18 ENCOUNTER — Other Ambulatory Visit: Payer: BLUE CROSS/BLUE SHIELD | Admitting: Lab

## 2014-09-18 ENCOUNTER — Ambulatory Visit: Payer: Self-pay

## 2014-09-18 ENCOUNTER — Encounter (HOSPITAL_COMMUNITY)
Admission: RE | Admit: 2014-09-18 | Discharge: 2014-09-18 | Disposition: A | Discharge status: Home / Self Care | Payer: BLUE CROSS/BLUE SHIELD | Source: Ambulatory Visit | Attending: Orthopedic Surgery | Admitting: Orthopedic Surgery

## 2014-09-18 ENCOUNTER — Encounter (HOSPITAL_COMMUNITY): Payer: Self-pay

## 2014-09-18 DIAGNOSIS — M79604 Pain in right leg: Secondary | ICD-10-CM | POA: Insufficient documentation

## 2014-09-18 DIAGNOSIS — M79605 Pain in left leg: Secondary | ICD-10-CM | POA: Insufficient documentation

## 2014-09-18 DIAGNOSIS — Z01818 Encounter for other preprocedural examination: Secondary | ICD-10-CM | POA: Diagnosis present

## 2014-09-18 DIAGNOSIS — M545 Low back pain: Secondary | ICD-10-CM | POA: Diagnosis not present

## 2014-09-18 HISTORY — DX: Family history of other disabilities and chronic diseases leading to disablement, not elsewhere classified: Z82.8

## 2014-09-18 HISTORY — DX: Family history of other specified conditions: Z84.89

## 2014-09-18 LAB — CBC WITH DIFFERENTIAL/PLATELET
Basophils Absolute: 0 10*3/uL (ref 0.0–0.1)
Basophils Relative: 0 % (ref 0–1)
EOS ABS: 0.1 10*3/uL (ref 0.0–0.7)
Eosinophils Relative: 4 % (ref 0–5)
HEMATOCRIT: 35 % — AB (ref 39.0–52.0)
HEMOGLOBIN: 11.8 g/dL — AB (ref 13.0–17.0)
LYMPHS ABS: 0.7 10*3/uL (ref 0.7–4.0)
Lymphocytes Relative: 31 % (ref 12–46)
MCH: 31.5 pg (ref 26.0–34.0)
MCHC: 33.7 g/dL (ref 30.0–36.0)
MCV: 93.3 fL (ref 78.0–100.0)
Monocytes Absolute: 0.4 10*3/uL (ref 0.1–1.0)
Monocytes Relative: 16 % — ABNORMAL HIGH (ref 3–12)
NEUTROS ABS: 1.1 10*3/uL — AB (ref 1.7–7.7)
NEUTROS PCT: 49 % (ref 43–77)
PLATELETS: 135 10*3/uL — AB (ref 150–400)
RBC: 3.75 MIL/uL — ABNORMAL LOW (ref 4.22–5.81)
RDW: 14.7 % (ref 11.5–15.5)
WBC: 2.3 10*3/uL — ABNORMAL LOW (ref 4.0–10.5)

## 2014-09-18 LAB — URINE MICROSCOPIC-ADD ON

## 2014-09-18 LAB — COMPREHENSIVE METABOLIC PANEL
ALT: 17 U/L (ref 0–53)
ANION GAP: 5 (ref 5–15)
AST: 25 U/L (ref 0–37)
Albumin: 4.2 g/dL (ref 3.5–5.2)
Alkaline Phosphatase: 51 U/L (ref 39–117)
BILIRUBIN TOTAL: 2.4 mg/dL — AB (ref 0.3–1.2)
BUN: 8 mg/dL (ref 6–23)
CHLORIDE: 107 mmol/L (ref 96–112)
CO2: 27 mmol/L (ref 19–32)
CREATININE: 1.19 mg/dL (ref 0.50–1.35)
Calcium: 8.7 mg/dL (ref 8.4–10.5)
GFR calc Af Amer: 86 mL/min — ABNORMAL LOW (ref >90)
GFR calc non Af Amer: 74 mL/min — ABNORMAL LOW (ref >90)
Glucose, Bld: 79 mg/dL (ref 70–99)
POTASSIUM: 3.6 mmol/L (ref 3.5–5.1)
Sodium: 139 mmol/L (ref 135–145)
TOTAL PROTEIN: 7.1 g/dL (ref 6.0–8.3)

## 2014-09-18 LAB — URINALYSIS, ROUTINE W REFLEX MICROSCOPIC
Bilirubin Urine: NEGATIVE
Glucose, UA: NEGATIVE mg/dL
KETONES UR: NEGATIVE mg/dL
Leukocytes, UA: NEGATIVE
Nitrite: NEGATIVE
PROTEIN: NEGATIVE mg/dL
Specific Gravity, Urine: 1.024 (ref 1.005–1.030)
UROBILINOGEN UA: 1 mg/dL (ref 0.0–1.0)
pH: 6 (ref 5.0–8.0)

## 2014-09-18 LAB — PROTIME-INR
INR: 1.02 (ref 0.00–1.49)
Prothrombin Time: 13.5 seconds (ref 11.6–15.2)

## 2014-09-18 LAB — SURGICAL PCR SCREEN
MRSA, PCR: NEGATIVE
STAPHYLOCOCCUS AUREUS: NEGATIVE

## 2014-09-18 LAB — APTT: aPTT: 28 seconds (ref 24–37)

## 2014-09-18 NOTE — Progress Notes (Signed)
No PCP.  Dr.Peter Marin Olp -oncologist. Last chemo 07/2013

## 2014-09-19 NOTE — Progress Notes (Signed)
Anesthesia Chart Review:  Pt is 44 year old male scheduled for L4-5 lumbar laminectomy/decompression microdiscectomy on 09/25/2014 with Dr. Lynann Bologna.   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 Former smoker. BMI 29.  Medications include: acyclovir, ASA, neurontin, revlimid  Preoperative labs reviewed.  WBC 2.3. Hgb 11.8. Platelets 135. These results are consistent with pt's previous CBC results.    EKG: NSR. LVH. Mild diffuse ST elevation likely early repolarization but cannot exclude pericarditis. No significant change since last tracing. Interpreted by Dr. Haroldine Laws.   Echo 08/02/2013: -Left ventricular hypertrophy -Normal left ventricular ejection fraction (17-47%) -Diastolic left ventricular dysfunction -Dilated left atrium -Normal right ventricular contractile performance  If no changes, I anticipate pt can proceed with surgery as scheduled.   Willeen Cass, FNP-BC [facility]/Anesthesiology Phone: [phone] 09/19/2014 4:18 PM

## 2014-09-22 ENCOUNTER — Ambulatory Visit (HOSPITAL_BASED_OUTPATIENT_CLINIC_OR_DEPARTMENT_OTHER): Payer: BLUE CROSS/BLUE SHIELD

## 2014-09-22 ENCOUNTER — Ambulatory Visit (HOSPITAL_BASED_OUTPATIENT_CLINIC_OR_DEPARTMENT_OTHER): Payer: BLUE CROSS/BLUE SHIELD | Admitting: Hematology & Oncology

## 2014-09-22 ENCOUNTER — Encounter: Payer: Self-pay | Admitting: Hematology & Oncology

## 2014-09-22 ENCOUNTER — Ambulatory Visit (HOSPITAL_BASED_OUTPATIENT_CLINIC_OR_DEPARTMENT_OTHER): Payer: BLUE CROSS/BLUE SHIELD | Admitting: Lab

## 2014-09-22 VITALS — BP 135/83 | HR 72 | Temp 98.3°F | Resp 18 | Ht 70.0 in | Wt 213.0 lb

## 2014-09-22 DIAGNOSIS — C9000 Multiple myeloma not having achieved remission: Secondary | ICD-10-CM | POA: Diagnosis not present

## 2014-09-22 DIAGNOSIS — M48061 Spinal stenosis, lumbar region without neurogenic claudication: Secondary | ICD-10-CM

## 2014-09-22 DIAGNOSIS — Z23 Encounter for immunization: Secondary | ICD-10-CM | POA: Diagnosis not present

## 2014-09-22 LAB — CBC WITH DIFFERENTIAL (CANCER CENTER ONLY)
BASO#: 0 10*3/uL (ref 0.0–0.2)
BASO%: 0.3 % (ref 0.0–2.0)
EOS%: 3.8 % (ref 0.0–7.0)
Eosinophils Absolute: 0.1 10*3/uL (ref 0.0–0.5)
HCT: 34.8 % — ABNORMAL LOW (ref 38.7–49.9)
HGB: 11.6 g/dL — ABNORMAL LOW (ref 13.0–17.1)
LYMPH#: 0.9 10*3/uL (ref 0.9–3.3)
LYMPH%: 32.5 % (ref 14.0–48.0)
MCH: 32.1 pg (ref 28.0–33.4)
MCHC: 33.3 g/dL (ref 32.0–35.9)
MCV: 96 fL (ref 82–98)
MONO#: 0.4 10*3/uL (ref 0.1–0.9)
MONO%: 12.5 % (ref 0.0–13.0)
NEUT#: 1.5 10*3/uL (ref 1.5–6.5)
NEUT%: 50.9 % (ref 40.0–80.0)
Platelets: 134 10*3/uL — ABNORMAL LOW (ref 145–400)
RBC: 3.61 10*6/uL — AB (ref 4.20–5.70)
RDW: 14 % (ref 11.1–15.7)
WBC: 2.9 10*3/uL — ABNORMAL LOW (ref 4.0–10.0)

## 2014-09-22 LAB — CMP (CANCER CENTER ONLY)
ALT(SGPT): 13 U/L (ref 10–47)
AST: 20 U/L (ref 11–38)
Albumin: 4 g/dL (ref 3.3–5.5)
Alkaline Phosphatase: 44 U/L (ref 26–84)
BILIRUBIN TOTAL: 1.7 mg/dL — AB (ref 0.20–1.60)
BUN, Bld: 14 mg/dL (ref 7–22)
CO2: 29 mEq/L (ref 18–33)
Calcium: 8.5 mg/dL (ref 8.0–10.3)
Chloride: 100 mEq/L (ref 98–108)
Creat: 1.2 mg/dl (ref 0.6–1.2)
GLUCOSE: 129 mg/dL — AB (ref 73–118)
Potassium: 4.3 mEq/L (ref 3.3–4.7)
SODIUM: 141 meq/L (ref 128–145)
Total Protein: 7.2 g/dL (ref 6.4–8.1)

## 2014-09-22 MED ORDER — HAEMOPHILUS B POLYSAC CONJ VAC IM SOLR
0.5000 mL | Freq: Once | INTRAMUSCULAR | Status: AC
  Administered 2014-09-22: 0.5 mL via INTRAMUSCULAR
  Filled 2014-09-22: qty 0.5

## 2014-09-22 MED ORDER — SODIUM CHLORIDE 0.9 % IV SOLN
INTRAVENOUS | Status: DC
  Administered 2014-09-22: 10:00:00 via INTRAVENOUS

## 2014-09-22 MED ORDER — DTAP-HEPATITIS B RECOMB-IPV IM SUSP
0.5000 mL | Freq: Once | INTRAMUSCULAR | Status: AC
  Administered 2014-09-22: 0.5 mL via INTRAMUSCULAR
  Filled 2014-09-22: qty 0.5

## 2014-09-22 MED ORDER — PNEUMOCOCCAL 13-VAL CONJ VACC IM SUSP
0.5000 mL | Freq: Once | INTRAMUSCULAR | Status: AC
  Administered 2014-09-22: 0.5 mL via INTRAMUSCULAR
  Filled 2014-09-22: qty 0.5

## 2014-09-22 MED ORDER — ALTEPLASE 2 MG IJ SOLR
2.0000 mg | Freq: Once | INTRAMUSCULAR | Status: DC | PRN
  Filled 2014-09-22: qty 2

## 2014-09-22 MED ORDER — ZOLEDRONIC ACID 4 MG/100ML IV SOLN
4.0000 mg | Freq: Once | INTRAVENOUS | Status: AC
  Administered 2014-09-22: 4 mg via INTRAVENOUS
  Filled 2014-09-22: qty 100

## 2014-09-22 NOTE — Progress Notes (Signed)
Hematology and Oncology Follow Up Visit  Mark [name] [id number] Jun 19, 1971 44 y.o. 09/22/2014   Principle Diagnosis:   Kappa light chain myeloma  Current Therapy:   Revlimid 10mg  po q day (21/7) - to be on hold Zometa 4 mg IV q. month Velcade q 3 week dosing - to be on hold     Interim History:  [name] is back for followup. He will be going to surgery this Thursday. He saw Dr. Lynann Bologna of with the surgery. Dr. Lynann Bologna felt that he had severe spinal stenosis. As such, he felt that he could alleviate Mark [name] symptoms with surgery.  On some Mark [name] last in January, we did an MRI. MRI showed a unchanged pathologic fracture at T12. He had the treated myeloma lesion at the right of L5. This was stable. However, it was felt that there might be right L5 nerve root impingement because of this tumor. He had some areas of congenital spinal stenosis. He had severe stenosis at L4-5.  He then had a myelogram. This showed L4-L5 central stenosis. He also had L5-S1 right greater the left foraminal stenosis.  His last myeloma studies back in late January showed a Kappa  Light chain of 2.71. His M spike was 0.21 g/L. this is improving.  He still has symptoms. He still has the tingling in his feet. He still has some tingling in his right thigh when he lies down.  Overall, his performance status is ECOG 1.  Medications:  Current outpatient prescriptions:  .  acetaminophen (TYLENOL) 500 MG tablet, Take 500 mg by mouth every 6 (six) hours as needed for moderate pain or headache., Disp: , Rfl:  .  acyclovir (ZOVIRAX) 800 MG tablet, Take 1 tablet (800 mg total) by mouth 2 (two) times daily., Disp: 60 tablet, Rfl: 0 .  aspirin EC 325 MG tablet, Take 1 tablet (325 mg total) by mouth daily., Disp: 100 tablet, Rfl: 3 .  bisacodyl (DULCOLAX) 5 MG EC tablet, Take 5 mg by mouth daily as needed for moderate constipation., Disp: , Rfl:  .  calcium-vitamin D (OSCAL WITH D) 500-200 MG-UNIT per tablet, Take 1  tablet by mouth daily with breakfast., Disp: , Rfl:  .  gabapentin (NEURONTIN) 300 MG capsule, Take 300-600 mg by mouth 2 (two) times daily. 300mg  in the morning and 600mg  in the evening, Disp: , Rfl: 3 .  ibuprofen (ADVIL,MOTRIN) 200 MG tablet, Take 400 mg by mouth every 6 (six) hours as needed (pain). , Disp: , Rfl:  .  oxyCODONE-acetaminophen (PERCOCET) 10-325 MG per tablet, Take 1 tablet by mouth every 4 (four) hours as needed for pain., Disp: 120 tablet, Rfl: 0 .  prochlorperazine (COMPAZINE) 10 MG tablet, Take 10 mg by mouth every 8 (eight) hours as needed for nausea or vomiting., Disp: , Rfl:  .  lenalidomide (REVLIMID) 10 MG capsule, Take 1 capsule (10 mg total) by mouth daily. Josem [name] #[id number] (Patient not taking: Reported on 09/22/2014), Disp: 28 capsule, Rfl: 0  Allergies: No Known Allergies  Past Medical History, Surgical history, Social history, and Family History were reviewed and updated.  Review of Systems: As above  Physical Exam:  height is 5\' 10"  (1.778 m) and weight is 213 lb (96.616 kg). His oral temperature is 98.3 F (36.8 C). His blood pressure is 135/83 and his pulse is 72. His respiration is 18.   Well-developed and well-nourished African American gentleman. He is alert and oriented x3. Vital signs are stable. Head and neck  exam shows no ocular or oral lesions. He has no palpable cervical or supraclavicular lymph nodes. Oral cavity is totally dry. Lungs are clear. Cardiac exam regular rate and rhythm with no murmurs rubs or bruits. Abdomen is soft. He has good bowel sounds. There is no fluid wave. There is no palpable liver or spleen tip. Back exam shows no tenderness over the spine ribs or hips. Extremities shows no clubbing cyanosis or edema.  He has good range of motion of his knees. There is no swelling of his knee joint. Skin exam no rashes. Neurological exam is nonfocal. He has good strength in his legs and feet. Massive slight decrease to sensation in his feet and lower  legs. There is no weakness.  Lab Results  Component Value Date   WBC 2.9* 09/22/2014   HGB 11.6* 09/22/2014   HCT 34.8* 09/22/2014   MCV 96 09/22/2014   PLT 134* 09/22/2014     Chemistry      Component Value Date/Time   NA 141 09/22/2014 0859   NA 139 09/18/2014 0919   NA 145 02/21/2013 0908   K 4.3 09/22/2014 0859   K 3.6 09/18/2014 0919   K 3.8 02/21/2013 0908   CL 100 09/22/2014 0859   CL 107 09/18/2014 0919   CO2 29 09/22/2014 0859   CO2 27 09/18/2014 0919   CO2 32* 02/21/2013 0908   BUN 14 09/22/2014 0859   BUN 8 09/18/2014 0919   BUN 19.5 02/21/2013 0908   CREATININE 1.2 09/22/2014 0859   CREATININE 1.19 09/18/2014 0919   CREATININE 1.5* 02/21/2013 0908      Component Value Date/Time   CALCIUM 8.5 09/22/2014 0859   CALCIUM 8.7 09/18/2014 0919   CALCIUM 17.7 Repeated and Verified* 02/21/2013 0908   CALCIUM >15.0* 02/01/2013 1230   ALKPHOS 44 09/22/2014 0859   ALKPHOS 51 09/18/2014 0919   AST 20 09/22/2014 0859   AST 25 09/18/2014 0919   ALT 13 09/22/2014 0859   ALT 17 09/18/2014 0919   BILITOT 1.70* 09/22/2014 0859   BILITOT 2.4* 09/18/2014 0919         Impression and Plan: Mark [name] is 44 year old -year-old African American male. 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 This was in January or early February of 2015. This was done at [facility].  For now, we will continue to hold on the Velcade and Revlimid.  He'll be going to surgery for spinal stenosis this Thursday. I think his blood counts are okay for this. I don't see that we have to give him any chemotherapy right now as this might potentially lower his blood counts in could cause some issues with recovery.  I am still confident that the surgery and the findings will not be reflective of myeloma.  We will plan to get him back to see [place] probably about 4-5 weeks. I want to assure that he is able to come back and not have problems after his surgery.  When we see him back, then we  may consider restarting him on treatment again.  [name], MD 2/22/20165:46 PM

## 2014-09-22 NOTE — Patient Instructions (Signed)

## 2014-09-22 NOTE — Progress Notes (Signed)
[place] and had Revlimid placed on hold for now per Dr Antonieta Pert request. Spoke to [place]. She will place patient in a hold status until his therapy is resumed.

## 2014-09-24 LAB — PROTEIN ELECTROPHORESIS, SERUM, WITH REFLEX
ALPHA-2-GLOBULIN: 7.8 % (ref 7.1–11.8)
Albumin ELP: 61.2 % (ref 55.8–66.1)
Alpha-1-Globulin: 4.1 % (ref 2.9–4.9)
Beta 2: 4.5 % (ref 3.2–6.5)
Beta Globulin: 5.4 % (ref 4.7–7.2)
Gamma Globulin: 17 % (ref 11.1–18.8)
M-SPIKE, %: 0.23 g/dL
Total Protein, Serum Electrophoresis: 7 g/dL (ref 6.0–8.3)

## 2014-09-24 LAB — KAPPA/LAMBDA LIGHT CHAINS
Kappa free light chain: 2.57 mg/dL — ABNORMAL HIGH (ref 0.33–1.94)
Kappa:Lambda Ratio: 1.37 (ref 0.26–1.65)
Lambda Free Lght Chn: 1.88 mg/dL (ref 0.57–2.63)

## 2014-09-24 LAB — IGG, IGA, IGM
IGA: 103 mg/dL (ref 68–379)
IgG (Immunoglobin G), Serum: 1340 mg/dL (ref 650–1600)
IgM, Serum: 17 mg/dL — ABNORMAL LOW (ref 41–251)

## 2014-09-24 LAB — IFE INTERPRETATION

## 2014-09-24 MED ORDER — POVIDONE-IODINE 7.5 % EX SOLN
Freq: Once | CUTANEOUS | Status: DC
  Filled 2014-09-24: qty 118

## 2014-09-24 MED ORDER — CEFAZOLIN SODIUM-DEXTROSE 2-3 GM-% IV SOLR
2.0000 g | INTRAVENOUS | Status: AC
  Administered 2014-09-25: 2 g via INTRAVENOUS
  Filled 2014-09-24: qty 50

## 2014-09-24 NOTE — H&P (Signed)
PREOPERATIVE H&P  Chief Complaint: bilateral leg pain, right greater than left  HPI: Mark [name] is a 44 y.o. male who presents with ongoing pain in the right and left leg  MRI reveals stenosis at L4/5, with NF stenosis at L5/S1 on the right  Patient has failed multiple forms of conservative care and continues to have pain (see office notes for additional details regarding the patient's full course of treatment)  Past Medical History  Diagnosis Date  . History of radiation therapy 02/07/13- 02/26/13    lower L spine, upper sacrum, 35 gray in 14 fractions  . Cancer   . Multiple myeloma dx'd 01/2013  . FH: chemotherapy     Dr. Burney Gauze   Past Surgical History  Procedure Laterality Date  . Portacath placement    . Bone marrow transplant    . Kyphoplasty  05/20/14   History   Social History  . Marital Status: Married    Spouse Name: N/A  . Number of Children: N/A  . Years of Education: N/A   Social History Main Topics  . Smoking status: Former Smoker -- 1.00 packs/day for 15 years    Types: Cigarettes    Start date: 06/29/1988    Quit date: 06/29/2003  . Smokeless tobacco: Never Used     Comment: quit smoking 10 years ago  . Alcohol Use: No  . Drug Use: No  . Sexual Activity: No   Other Topics Concern  . Not on file   Social History Narrative   Family History  Problem Relation Age of Onset  . Diabetic kidney disease Mother   . Hypertension Mother   . Heart attack Mother   . Kidney failure Mother   . Coronary artery disease Mother   . HIV Father    No Known Allergies Prior to Admission medications   Medication Sig Start Date End Date Taking? Authorizing Provider  acetaminophen (TYLENOL) 500 MG tablet Take 500 mg by mouth every 6 (six) hours as needed for moderate pain or headache.   Yes Historical Provider, MD  acyclovir (ZOVIRAX) 800 MG tablet Take 1 tablet (800 mg total) by mouth 2 (two) times daily. 06/06/14  Yes [name], MD  aspirin EC  325 MG tablet Take 1 tablet (325 mg total) by mouth daily. 02/28/14  Yes Coralee Pesa Wertman, PA-C  bisacodyl (DULCOLAX) 5 MG EC tablet Take 5 mg by mouth daily as needed for moderate constipation.   Yes Historical Provider, MD  calcium-vitamin D (OSCAL WITH D) 500-200 MG-UNIT per tablet Take 1 tablet by mouth daily with breakfast.   Yes Historical Provider, MD  gabapentin (NEURONTIN) 300 MG capsule Take 300-600 mg by mouth 2 (two) times daily. 331m in the morning and 6033min the evening 06/06/14  Yes Historical Provider, MD  ibuprofen (ADVIL,MOTRIN) 200 MG tablet Take 400 mg by mouth every 6 (six) hours as needed (pain).    Yes Historical Provider, MD  lenalidomide (REVLIMID) 10 MG capsule Take 1 capsule (10 mg total) by mouth daily. AuJosem Kaufmann4#[id number] not taking: Reported on 09/22/2014 08/28/14  Yes PeVolanda NapoleonMD  oxyCODONE-acetaminophen (PERCOCET) 10-325 MG per tablet Take 1 tablet by mouth every 4 (four) hours as needed for pain. 08/28/14  Yes PeVolanda NapoleonMD  prochlorperazine (COMPAZINE) 10 MG tablet Take 10 mg by mouth every 8 (eight) hours as needed for nausea or vomiting.   Yes Historical Provider, MD     All other systems have  been reviewed and were otherwise negative with the exception of those mentioned in the HPI and as above.  Physical Exam: There were no vitals filed for this visit.  General: Alert, no acute distress Cardiovascular: No pedal edema Respiratory: No cyanosis, no use of accessory musculature Skin: No lesions in the area of chief complaint Neurologic: Sensation intact distally Psychiatric: Patient is competent for consent with normal mood and affect Lymphatic: No axillary or cervical lymphadenopathy   Assessment/Plan: Bilateral leg pain Plan for Procedure(s): LUMBAR LAMINECTOMY/DECOMPRESSION L4/5 and L5/S1   Sinclair Ship, MD 09/24/2014 3:08 PM

## 2014-09-25 ENCOUNTER — Other Ambulatory Visit: Payer: Self-pay | Admitting: Family

## 2014-09-25 ENCOUNTER — Ambulatory Visit (HOSPITAL_COMMUNITY): Payer: BLUE CROSS/BLUE SHIELD

## 2014-09-25 ENCOUNTER — Ambulatory Visit (HOSPITAL_COMMUNITY): Payer: BLUE CROSS/BLUE SHIELD | Admitting: Emergency Medicine

## 2014-09-25 ENCOUNTER — Encounter (HOSPITAL_COMMUNITY)
Admission: RE | Disposition: A | Discharge status: Home / Self Care | Payer: BLUE CROSS/BLUE SHIELD | Source: Ambulatory Visit | Attending: Orthopedic Surgery

## 2014-09-25 ENCOUNTER — Ambulatory Visit (HOSPITAL_COMMUNITY)
Admission: RE | Admit: 2014-09-25 | Discharge: 2014-09-25 | Disposition: A | Discharge status: Home / Self Care | Payer: BLUE CROSS/BLUE SHIELD | Source: Ambulatory Visit | Attending: Orthopedic Surgery | Admitting: Orthopedic Surgery

## 2014-09-25 ENCOUNTER — Ambulatory Visit (HOSPITAL_COMMUNITY): Payer: BLUE CROSS/BLUE SHIELD | Admitting: Certified Registered Nurse Anesthetist

## 2014-09-25 DIAGNOSIS — G9529 Other cord compression: Secondary | ICD-10-CM | POA: Insufficient documentation

## 2014-09-25 DIAGNOSIS — M4807 Spinal stenosis, lumbosacral region: Secondary | ICD-10-CM | POA: Diagnosis not present

## 2014-09-25 DIAGNOSIS — M4806 Spinal stenosis, lumbar region: Secondary | ICD-10-CM | POA: Insufficient documentation

## 2014-09-25 DIAGNOSIS — Z79891 Long term (current) use of opiate analgesic: Secondary | ICD-10-CM | POA: Insufficient documentation

## 2014-09-25 DIAGNOSIS — Z87891 Personal history of nicotine dependence: Secondary | ICD-10-CM | POA: Insufficient documentation

## 2014-09-25 DIAGNOSIS — Z9889 Other specified postprocedural states: Secondary | ICD-10-CM | POA: Insufficient documentation

## 2014-09-25 DIAGNOSIS — Z791 Long term (current) use of non-steroidal anti-inflammatories (NSAID): Secondary | ICD-10-CM | POA: Diagnosis not present

## 2014-09-25 DIAGNOSIS — Z79899 Other long term (current) drug therapy: Secondary | ICD-10-CM | POA: Diagnosis not present

## 2014-09-25 DIAGNOSIS — C9000 Multiple myeloma not having achieved remission: Secondary | ICD-10-CM | POA: Insufficient documentation

## 2014-09-25 DIAGNOSIS — Z9481 Bone marrow transplant status: Secondary | ICD-10-CM | POA: Insufficient documentation

## 2014-09-25 DIAGNOSIS — Z419 Encounter for procedure for purposes other than remedying health state, unspecified: Secondary | ICD-10-CM

## 2014-09-25 DIAGNOSIS — M79605 Pain in left leg: Secondary | ICD-10-CM | POA: Diagnosis present

## 2014-09-25 DIAGNOSIS — Z7982 Long term (current) use of aspirin: Secondary | ICD-10-CM | POA: Diagnosis not present

## 2014-09-25 DIAGNOSIS — M5387 Other specified dorsopathies, lumbosacral region: Secondary | ICD-10-CM | POA: Diagnosis not present

## 2014-09-25 DIAGNOSIS — Z923 Personal history of irradiation: Secondary | ICD-10-CM | POA: Insufficient documentation

## 2014-09-25 DIAGNOSIS — M79604 Pain in right leg: Secondary | ICD-10-CM | POA: Diagnosis present

## 2014-09-25 HISTORY — PX: LUMBAR LAMINECTOMY/DECOMPRESSION MICRODISCECTOMY: SHX5026

## 2014-09-25 SURGERY — LUMBAR LAMINECTOMY/DECOMPRESSION MICRODISCECTOMY
Anesthesia: General

## 2014-09-25 MED ORDER — LABETALOL HCL 5 MG/ML IV SOLN
INTRAVENOUS | Status: AC
  Filled 2014-09-25: qty 4

## 2014-09-25 MED ORDER — OXYCODONE HCL 5 MG PO TABS: 5.0000 mg | ORAL_TABLET | Freq: Once | ORAL | Status: DC | PRN

## 2014-09-25 MED ORDER — HYDROMORPHONE HCL 1 MG/ML IJ SOLN
INTRAMUSCULAR | Status: AC
  Filled 2014-09-25: qty 1

## 2014-09-25 MED ORDER — ONDANSETRON HCL 4 MG/2ML IJ SOLN
INTRAMUSCULAR | Status: AC
  Filled 2014-09-25: qty 2

## 2014-09-25 MED ORDER — HYDROMORPHONE HCL 1 MG/ML IJ SOLN: 0.2500 mg | INTRAMUSCULAR | Status: DC | PRN

## 2014-09-25 MED ORDER — THROMBIN 20000 UNITS EX SOLR
CUTANEOUS | Status: AC
  Filled 2014-09-25: qty 20000

## 2014-09-25 MED ORDER — LACTATED RINGERS IV SOLN
INTRAVENOUS | Status: DC
  Administered 2014-09-25: 11:00:00 via INTRAVENOUS

## 2014-09-25 MED ORDER — ACETAMINOPHEN 325 MG PO TABS: 325.0000 mg | ORAL_TABLET | ORAL | Status: DC | PRN

## 2014-09-25 MED ORDER — HYDROMORPHONE HCL 1 MG/ML IJ SOLN
0.2500 mg | INTRAMUSCULAR | Status: DC | PRN
  Administered 2014-09-25 (×3): 0.5 mg via INTRAVENOUS

## 2014-09-25 MED ORDER — LIDOCAINE HCL (CARDIAC) 20 MG/ML IV SOLN
INTRAVENOUS | Status: DC | PRN
  Administered 2014-09-25: 80 mg via INTRAVENOUS

## 2014-09-25 MED ORDER — METHYLENE BLUE 1 % INJ SOLN
INTRAMUSCULAR | Status: AC
  Filled 2014-09-25: qty 10

## 2014-09-25 MED ORDER — THROMBIN 20000 UNITS EX SOLR
CUTANEOUS | Status: DC | PRN
  Administered 2014-09-25: 20 mL via TOPICAL

## 2014-09-25 MED ORDER — ROCURONIUM BROMIDE 50 MG/5ML IV SOLN
INTRAVENOUS | Status: AC
  Filled 2014-09-25: qty 1

## 2014-09-25 MED ORDER — EPHEDRINE SULFATE 50 MG/ML IJ SOLN
INTRAMUSCULAR | Status: AC
  Filled 2014-09-25: qty 1

## 2014-09-25 MED ORDER — LACTATED RINGERS IV SOLN
INTRAVENOUS | Status: DC | PRN
  Administered 2014-09-25 (×3): via INTRAVENOUS

## 2014-09-25 MED ORDER — MIDAZOLAM HCL 2 MG/2ML IJ SOLN
INTRAMUSCULAR | Status: AC
  Filled 2014-09-25: qty 2

## 2014-09-25 MED ORDER — OXYCODONE HCL 5 MG PO TABS
ORAL_TABLET | ORAL | Status: AC
  Filled 2014-09-25: qty 1

## 2014-09-25 MED ORDER — 0.9 % SODIUM CHLORIDE (POUR BTL) OPTIME
TOPICAL | Status: DC | PRN
  Administered 2014-09-25: 1000 mL

## 2014-09-25 MED ORDER — MIDAZOLAM HCL 5 MG/5ML IJ SOLN
INTRAMUSCULAR | Status: DC | PRN
  Administered 2014-09-25: 2 mg via INTRAVENOUS

## 2014-09-25 MED ORDER — OXYCODONE HCL 5 MG PO TABS
5.0000 mg | ORAL_TABLET | Freq: Once | ORAL | Status: AC | PRN
  Administered 2014-09-25: 5 mg via ORAL

## 2014-09-25 MED ORDER — GLYCOPYRROLATE 0.2 MG/ML IJ SOLN
INTRAMUSCULAR | Status: DC | PRN
  Administered 2014-09-25: .8 mg via INTRAVENOUS

## 2014-09-25 MED ORDER — ACETAMINOPHEN 160 MG/5ML PO SOLN: 325.0000 mg | ORAL | Status: DC | PRN

## 2014-09-25 MED ORDER — METHYLPREDNISOLONE ACETATE 40 MG/ML IJ SUSP
INTRAMUSCULAR | Status: AC
  Filled 2014-09-25: qty 1

## 2014-09-25 MED ORDER — LIDOCAINE HCL (CARDIAC) 20 MG/ML IV SOLN
INTRAVENOUS | Status: AC
  Filled 2014-09-25: qty 5

## 2014-09-25 MED ORDER — PROPOFOL 10 MG/ML IV BOLUS
INTRAVENOUS | Status: DC | PRN
  Administered 2014-09-25: 160 mg via INTRAVENOUS

## 2014-09-25 MED ORDER — PROPOFOL 10 MG/ML IV BOLUS
INTRAVENOUS | Status: AC
  Filled 2014-09-25: qty 20

## 2014-09-25 MED ORDER — BUPIVACAINE-EPINEPHRINE (PF) 0.25% -1:200000 IJ SOLN
INTRAMUSCULAR | Status: AC
  Filled 2014-09-25: qty 30

## 2014-09-25 MED ORDER — BUPIVACAINE-EPINEPHRINE 0.25% -1:200000 IJ SOLN
INTRAMUSCULAR | Status: DC | PRN
  Administered 2014-09-25: 3 mL

## 2014-09-25 MED ORDER — METHYLPREDNISOLONE ACETATE 40 MG/ML IJ SUSP
INTRAMUSCULAR | Status: DC | PRN
  Administered 2014-09-25: 40 mg

## 2014-09-25 MED ORDER — OXYCODONE HCL 5 MG/5ML PO SOLN: 5.0000 mg | Freq: Once | ORAL | Status: DC | PRN

## 2014-09-25 MED ORDER — METHYLPREDNISOLONE ACETATE 80 MG/ML IJ SUSP
INTRAMUSCULAR | Status: AC
  Filled 2014-09-25: qty 1

## 2014-09-25 MED ORDER — ACETAMINOPHEN 325 MG PO TABS
325.0000 mg | ORAL_TABLET | ORAL | Status: DC | PRN
Start: 2014-09-25 — End: 2014-09-25

## 2014-09-25 MED ORDER — OXYCODONE HCL 5 MG/5ML PO SOLN: 5.0000 mg | Freq: Once | ORAL | Status: AC | PRN

## 2014-09-25 MED ORDER — SODIUM CHLORIDE 0.9 % IJ SOLN
INTRAMUSCULAR | Status: AC
  Filled 2014-09-25: qty 10

## 2014-09-25 MED ORDER — FENTANYL CITRATE 0.05 MG/ML IJ SOLN
INTRAMUSCULAR | Status: DC | PRN
  Administered 2014-09-25 (×2): 50 ug via INTRAVENOUS
  Administered 2014-09-25 (×2): 100 ug via INTRAVENOUS

## 2014-09-25 MED ORDER — ROCURONIUM BROMIDE 100 MG/10ML IV SOLN
INTRAVENOUS | Status: DC | PRN
  Administered 2014-09-25: 20 mg via INTRAVENOUS
  Administered 2014-09-25: 50 mg via INTRAVENOUS

## 2014-09-25 MED ORDER — FENTANYL CITRATE 0.05 MG/ML IJ SOLN
INTRAMUSCULAR | Status: AC
  Filled 2014-09-25: qty 5

## 2014-09-25 MED ORDER — NEOSTIGMINE METHYLSULFATE 10 MG/10ML IV SOLN
INTRAVENOUS | Status: DC | PRN
  Administered 2014-09-25: 5 mg via INTRAVENOUS

## 2014-09-25 MED ORDER — ONDANSETRON HCL 4 MG/2ML IJ SOLN
INTRAMUSCULAR | Status: DC | PRN
  Administered 2014-09-25: 4 mg via INTRAVENOUS

## 2014-09-25 SURGICAL SUPPLY — 76 items
APL SKNCLS STERI-STRIP NONHPOA (GAUZE/BANDAGES/DRESSINGS) ×1
BENZOIN TINCTURE PRP APPL 2/3 (GAUZE/BANDAGES/DRESSINGS) ×2 IMPLANT
BUR ROUND PRECISION 4.0 (BURR) ×2 IMPLANT
BUR ROUND PRECISION 4.0MM (BURR) ×1
CANISTER SUCTION 2500CC (MISCELLANEOUS) ×3 IMPLANT
CARTRIDGE OIL MAESTRO DRILL (MISCELLANEOUS) ×1 IMPLANT
CLOSURE STERI-STRIP 1/2X4 (GAUZE/BANDAGES/DRESSINGS) ×1
CLOSURE WOUND 1/2 X4 (GAUZE/BANDAGES/DRESSINGS) ×1
CLSR STERI-STRIP ANTIMIC 1/2X4 (GAUZE/BANDAGES/DRESSINGS) ×1 IMPLANT
CORDS BIPOLAR (ELECTRODE) ×1 IMPLANT
COVER SURGICAL LIGHT HANDLE (MISCELLANEOUS) ×3 IMPLANT
DIFFUSER DRILL AIR PNEUMATIC (MISCELLANEOUS) ×3 IMPLANT
DRAIN CHANNEL 15F RND FF W/TCR (WOUND CARE) IMPLANT
DRAPE POUCH INSTRU U-SHP 10X18 (DRAPES) ×4 IMPLANT
DRAPE SURG 17X23 STRL (DRAPES) ×12 IMPLANT
DURAPREP 26ML APPLICATOR (WOUND CARE) ×3 IMPLANT
ELECT BLADE 4.0 EZ CLEAN MEGAD (MISCELLANEOUS)
ELECT CAUTERY BLADE 6.4 (BLADE) ×3 IMPLANT
ELECT REM PT RETURN 9FT ADLT (ELECTROSURGICAL) ×3
ELECTRODE BLDE 4.0 EZ CLN MEGD (MISCELLANEOUS) IMPLANT
ELECTRODE REM PT RTRN 9FT ADLT (ELECTROSURGICAL) ×1 IMPLANT
EVACUATOR SILICONE 100CC (DRAIN) IMPLANT
FILTER STRAW FLUID ASPIR (MISCELLANEOUS) ×1 IMPLANT
GAUZE SPONGE 4X4 12PLY STRL (GAUZE/BANDAGES/DRESSINGS) ×3 IMPLANT
GAUZE SPONGE 4X4 16PLY XRAY LF (GAUZE/BANDAGES/DRESSINGS) ×6 IMPLANT
GLOVE BIO SURGEON STRL SZ7 (GLOVE) ×3 IMPLANT
GLOVE BIO SURGEON STRL SZ8 (GLOVE) ×3 IMPLANT
GLOVE BIOGEL PI IND STRL 7.0 (GLOVE) ×1 IMPLANT
GLOVE BIOGEL PI IND STRL 8 (GLOVE) ×1 IMPLANT
GLOVE BIOGEL PI INDICATOR 7.0 (GLOVE) ×2
GLOVE BIOGEL PI INDICATOR 8 (GLOVE) ×2
GOWN STRL REUS W/ TWL LRG LVL3 (GOWN DISPOSABLE) ×1 IMPLANT
GOWN STRL REUS W/ TWL XL LVL3 (GOWN DISPOSABLE) ×2 IMPLANT
GOWN STRL REUS W/TWL LRG LVL3 (GOWN DISPOSABLE) ×6
GOWN STRL REUS W/TWL XL LVL3 (GOWN DISPOSABLE) ×3
IV CATH 14GX2 1/4 (CATHETERS) ×3 IMPLANT
KIT BASIN OR (CUSTOM PROCEDURE TRAY) ×3 IMPLANT
KIT POSITION SURG JACKSON T1 (MISCELLANEOUS) ×3 IMPLANT
KIT ROOM TURNOVER OR (KITS) ×3 IMPLANT
NDL 18GX1X1/2 (RX/OR ONLY) (NEEDLE) ×1 IMPLANT
NDL HYPO 25GX1X1/2 BEV (NEEDLE) ×1 IMPLANT
NDL SPNL 18GX3.5 QUINCKE PK (NEEDLE) ×2 IMPLANT
NEEDLE 18GX1X1/2 (RX/OR ONLY) (NEEDLE) ×3 IMPLANT
NEEDLE 22X1 1/2 (OR ONLY) (NEEDLE) ×3 IMPLANT
NEEDLE HYPO 25GX1X1/2 BEV (NEEDLE) ×3 IMPLANT
NEEDLE SPNL 18GX3.5 QUINCKE PK (NEEDLE) ×6 IMPLANT
NS IRRIG 1000ML POUR BTL (IV SOLUTION) ×3 IMPLANT
OIL CARTRIDGE MAESTRO DRILL (MISCELLANEOUS) ×3
PACK LAMINECTOMY ORTHO (CUSTOM PROCEDURE TRAY) ×3 IMPLANT
PACK UNIVERSAL I (CUSTOM PROCEDURE TRAY) ×3 IMPLANT
PAD ARMBOARD 7.5X6 YLW CONV (MISCELLANEOUS) ×6 IMPLANT
PATTIES SURGICAL .5 X.5 (GAUZE/BANDAGES/DRESSINGS) IMPLANT
PATTIES SURGICAL .5 X1 (DISPOSABLE) ×3 IMPLANT
SPONGE INTESTINAL PEANUT (DISPOSABLE) ×3 IMPLANT
SPONGE SURGIFOAM ABS GEL 100 (HEMOSTASIS) ×3 IMPLANT
SPONGE SURGIFOAM ABS GEL SZ50 (HEMOSTASIS) ×1 IMPLANT
STRIP CLOSURE SKIN 1/2X4 (GAUZE/BANDAGES/DRESSINGS) ×1 IMPLANT
SURGIFLO TRUKIT (HEMOSTASIS) IMPLANT
SUT MNCRL AB 4-0 PS2 18 (SUTURE) ×3 IMPLANT
SUT VIC AB 0 CT1 18XCR BRD 8 (SUTURE) IMPLANT
SUT VIC AB 0 CT1 27 (SUTURE)
SUT VIC AB 0 CT1 27XBRD ANBCTR (SUTURE) IMPLANT
SUT VIC AB 0 CT1 8-18 (SUTURE) ×3
SUT VIC AB 1 CT1 18XCR BRD 8 (SUTURE) ×1 IMPLANT
SUT VIC AB 1 CT1 8-18 (SUTURE) ×3
SUT VIC AB 2-0 CT2 18 VCP726D (SUTURE) ×3 IMPLANT
SYR 20CC LL (SYRINGE) IMPLANT
SYR BULB IRRIGATION 50ML (SYRINGE) ×3 IMPLANT
SYR CONTROL 10ML LL (SYRINGE) ×6 IMPLANT
SYR TB 1ML 26GX3/8 SAFETY (SYRINGE) ×6 IMPLANT
SYR TB 1ML LUER SLIP (SYRINGE) ×6 IMPLANT
TAPE CLOTH SURG 4X10 WHT LF (GAUZE/BANDAGES/DRESSINGS) ×2 IMPLANT
TOWEL OR 17X24 6PK STRL BLUE (TOWEL DISPOSABLE) ×3 IMPLANT
TOWEL OR 17X26 10 PK STRL BLUE (TOWEL DISPOSABLE) ×3 IMPLANT
WATER STERILE IRR 1000ML POUR (IV SOLUTION) ×1 IMPLANT
YANKAUER SUCT BULB TIP NO VENT (SUCTIONS) ×3 IMPLANT

## 2014-09-25 NOTE — Transfer of Care (Signed)
Immediate Anesthesia Transfer of Care Note  Patient: Mark [name]  Procedure(s) Performed: Procedure(s) with comments: LUMBAR LAMINECTOMY/DECOMPRESSION MICRODISCECTOMY (N/A) - Lumbar 4-5, L5-S1  decompression  Patient Location: PACU  Anesthesia Type:General  Level of Consciousness: awake, alert  and oriented  Airway & Oxygen Therapy: Patient Spontanous Breathing and Patient connected to nasal cannula oxygen  Post-op Assessment: Report given to RN and Post -op Vital signs reviewed and stable  Post vital signs: Reviewed and stable  Last Vitals:  Filed Vitals:   09/25/14 1623  BP: 131/80  Pulse: 76  Temp: 36.5 C  Resp: 14    Complications: No apparent anesthesia complications

## 2014-09-25 NOTE — Discharge Instructions (Signed)
What to eat: ° °For your first meals, you should eat lightly; only small meals initially.  If you do not have nausea, you may eat larger meals.  Avoid spicy, greasy and heavy food.   ° °General Anesthesia, Adult, Care After  °Refer to this sheet in the next few weeks. These instructions provide you with information on caring for yourself after your procedure. Your health care provider may also give you more specific instructions. Your treatment has been planned according to current medical practices, but problems sometimes occur. Call your health care provider if you have any problems or questions after your procedure.  °WHAT TO EXPECT AFTER THE PROCEDURE  °After the procedure, it is typical to experience:  °Sleepiness.  °Nausea and vomiting. °HOME CARE INSTRUCTIONS  °For the first 24 hours after general anesthesia:  °Have a responsible person with you.  °Do not drive a car. If you are alone, do not take public transportation.  °Do not drink alcohol.  °Do not take medicine that has not been prescribed by your health care provider.  °Do not sign important papers or make important decisions.  °You may resume a normal diet and activities as directed by your health care provider.  °Change bandages (dressings) as directed.  °If you have questions or problems that seem related to general anesthesia, call the hospital and ask for the anesthetist or anesthesiologist on call. °SEEK MEDICAL CARE IF:  °You have nausea and vomiting that continue the day after anesthesia.  °You develop a rash. °SEEK IMMEDIATE MEDICAL CARE IF:  °You have difficulty breathing.  °You have chest pain.  °You have any allergic problems. °Document Released: 10/24/2000 Document Revised: 03/20/2013 Document Reviewed: 01/31/2013  °ExitCare® Patient Information ©2014 [employer].  ° °Sore Throat  ° ° °A sore throat is a painful, burning, sore, or scratchy feeling of the throat. There may be pain or tenderness when swallowing or talking. You may have  other symptoms with a sore throat. These include coughing, sneezing, fever, or a swollen neck. A sore throat is often the first sign of another sickness. These sicknesses may include a cold, flu, strep throat, or an infection called mono. Most sore throats go away without medical treatment.  °HOME CARE  °Only take medicine as told by your doctor.  °Drink enough fluids to keep your pee (urine) clear or pale yellow.  °Rest as needed.  °Try using throat sprays, lozenges, or suck on hard candy (if older than 4 years or as told).  °Sip warm liquids, such as broth, herbal tea, or warm water with honey. Try sucking on frozen ice pops or drinking cold liquids.  °Rinse the mouth (gargle) with salt water. Mix 1 teaspoon salt with 8 ounces of water.  °Do not smoke. Avoid being around others when they are smoking.  °Put a humidifier in your bedroom at night to moisten the air. You can also turn on a hot shower and sit in the bathroom for 5-10 minutes. Be sure the bathroom door is closed. °GET HELP RIGHT AWAY IF:  °You have trouble breathing.  °You cannot swallow fluids, soft foods, or your spit (saliva).  °You have more puffiness (swelling) in the throat.  °Your sore throat does not get better in 7 days.  °You feel sick to your stomach (nauseous) and throw up (vomit).  °You have a fever or lasting symptoms for more than 2-3 days.  °You have a fever and your symptoms suddenly get worse. °MAKE SURE YOU:  °Understand these   instructions.  °Will watch your condition.  °Will get help right away if you are not doing well or get worse. °Document Released: 04/26/2008 Document Revised: 04/11/2012 Document Reviewed: 03/25/2012  °ExitCare® Patient Information ©2015 [employer]. This information is not intended to replace advice given to you by your health care provider. Make sure you discuss any questions you have with your health care provider.  ° ° °Laceration Care, Adult  ° ° °A laceration is a cut that goes through all layers of the  skin. The cut goes into the tissue beneath the skin.  °HOME CARE  °For stitches (sutures) or staples:  °Keep the cut clean and dry.  °If you have a bandage (dressing), change it at least once a day. Change the bandage if it gets wet or dirty, or as told by your doctor.  °Wash the cut with soap and water 2 times a day. Rinse the cut with water. Pat it dry with a clean towel.  °Put a thin layer of medicated cream on the cut as told by your doctor.  °You may shower after the first 24 hours. Do not soak the cut in water until the stitches are removed.  °Only take medicines as told by your doctor.  °Have your stitches or staples removed as told by your doctor. °For skin adhesive strips:  °Keep the cut clean and dry.  °Do not get the strips wet. You may take a bath, but be careful to keep the cut dry.  °If the cut gets wet, pat it dry with a clean towel.  °The strips will fall off on their own. Do not remove the strips that are still stuck to the cut. °For wound glue:  °You may shower or take baths. Do not soak or scrub the cut. Do not swim. Avoid heavy sweating until the glue falls off on its own. After a shower or bath, pat the cut dry with a clean towel.  °Do not put medicine on your cut until the glue falls off.  °If you have a bandage, do not put tape over the glue.  °Avoid lots of sunlight or tanning lamps until the glue falls off. Put sunscreen on the cut for the first year to reduce your scar.  °The glue will fall off on its own. Do not pick at the glue. °You may need a tetanus shot if:  °You cannot remember when you had your last tetanus shot.  °You have never had a tetanus shot. °If you need a tetanus shot and you choose not to have one, you may get tetanus. Sickness from tetanus can be serious.  °GET HELP RIGHT AWAY IF:  °Your pain does not get better with medicine.  °Your arm, hand, leg, or foot loses feeling (numbness) or changes color.  °Your cut is bleeding.  °Your joint feels weak, or you cannot use your  joint.  °You have painful lumps on your body.  °Your cut is red, puffy (swollen), or painful.  °You have a red line on the skin near the cut.  °You have yellowish-white fluid (pus) coming from the cut.  °You have a fever.  °You have a bad smell coming from the cut or bandage.  °Your cut breaks open before or after stitches are removed.  °You notice something coming out of the cut, such as wood or glass.  °You cannot move a finger or toe. °MAKE SURE YOU:  °Understand these instructions.  °Will watch your condition.  °Will get help   right away if you are not doing well or get worse. °Document Released: 01/04/2008 Document Revised: 10/10/2011 Document Reviewed: 01/11/2011  °ExitCare® Patient Information ©2014 [employer].  ° ° °

## 2014-09-25 NOTE — Anesthesia Postprocedure Evaluation (Signed)
  Anesthesia Post-op Note  Patient: Mark [name]  Procedure(s) Performed: Procedure(s) with comments: LUMBAR LAMINECTOMY/DECOMPRESSION MICRODISCECTOMY (N/A) - Lumbar 4-5, L5-S1  decompression  Patient Location: PACU  Anesthesia Type:General  Level of Consciousness: awake, alert , oriented and patient cooperative  Airway and Oxygen Therapy: Patient Spontanous Breathing  Post-op Pain: mild  Post-op Assessment: Post-op Vital signs reviewed, Patient's Cardiovascular Status Stable, Respiratory Function Stable, Patent Airway, No signs of Nausea or vomiting and Pain level controlled  Post-op Vital Signs: stable  Last Vitals:  Filed Vitals:   09/25/14 1708  BP: 134/93  Pulse: 64  Temp:   Resp: 15    Complications: No apparent anesthesia complications

## 2014-09-25 NOTE — Anesthesia Preprocedure Evaluation (Signed)
Anesthesia Evaluation  Patient identified by MRN, date of birth, ID band Patient awake    Reviewed: Allergy & Precautions, NPO status , Patient's Chart, lab work & pertinent test results  History of Anesthesia Complications Negative for: history of anesthetic complications  Airway Mallampati: I  TM Distance: >3 FB Neck ROM: Full    Dental  (+) Teeth Intact   Pulmonary neg shortness of breath, neg sleep apnea, neg COPDneg recent URI, former smoker,  breath sounds clear to auscultation        Cardiovascular negative cardio ROS  Rhythm:Regular     Neuro/Psych  Headaches, Low back pain negative psych ROS   GI/Hepatic negative GI ROS, Neg liver ROS,   Endo/Other  negative endocrine ROS  Renal/GU Renal InsufficiencyRenal disease     Musculoskeletal   Abdominal   Peds  Hematology  (+) anemia , Multiple myeloma   Anesthesia Other Findings   Reproductive/Obstetrics                             Anesthesia Physical Anesthesia Plan  ASA: III  Anesthesia Plan: General   Post-op Pain Management:    Induction: Intravenous  Airway Management Planned: Oral ETT  Additional Equipment: None  Intra-op Plan:   Post-operative Plan: Extubation in OR  Informed Consent: I have reviewed the patients History and Physical, chart, labs and discussed the procedure including the risks, benefits and alternatives for the proposed anesthesia with the patient or authorized representative who has indicated his/her understanding and acceptance.   Dental advisory given  Plan Discussed with: CRNA and Surgeon  Anesthesia Plan Comments:         Anesthesia Quick Evaluation  

## 2014-09-26 NOTE — Op Note (Signed)
NAMEMarland Kitchen  Mark [name], Mark [name]                 ACCOUNT NO.:  [id number]  MEDICAL RECORD NO.:  [id number]  LOCATION:  [facility]                         FACILITY:  [facility]  PHYSICIAN:  Phylliss Bob, MD      DATE OF BIRTH:  [birth year]  DATE OF PROCEDURE:  09/25/2014 DATE OF DISCHARGE:  09/25/2014                              OPERATIVE REPORT   PREOPERATIVE DIAGNOSES: 1. L4-5 spinal stenosis. 2. L5-S1 right-sided neural foraminal stenosis secondary to multiple     myeloma with a lesion involving the L5 segment, causing severe     compression of the neural foramen and the exiting L5 nerve.  POSTOPERATIVE DIAGNOSES: 1. L4-5 spinal stenosis. 2. L5-S1 right-sided neural foraminal stenosis secondary to multiple     myeloma with a lesion involving the L5 segment, causing severe     compression of the neural foramen and the exiting L5 nerve.  PROCEDURE:  L4-5, L5-S1 laminotomy and bilateral partial facetectomy and foraminotomy.  SURGEON:  Phylliss Bob, MD  ASSISTANT:  Pricilla Holm, PA-C  ANESTHESIA:  General endotracheal anesthesia.  COMPLICATIONS:  None.  DISPOSITION:  Stable.  ESTIMATED BLOOD LOSS:  Minimal.  INDICATIONS FOR SURGERY:  Briefly, Mark [name] is an extremely pleasant 44- year-old male who I did evaluate on September 25, 2014, with bilateral leg pain.  Of note, the patient's right leg was substantially worse than the left.  The patient did carry a diagnosis of multiple myeloma.  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  He did have symptoms very much consistent with neurogenic claudication and spinal stenosis.  His MRI did clearly reveal substantial stenosis at L4- 5, and severe right-sided neural foraminal stenosis at L5-S1.  His stenosis was secondary to facet hypertrophy in addition to multiple myeloma with lesions occupying the foramen on the right-sided L5-S1.  We did discuss operative intervention.  Specifically, we did discuss proceeding with a  decompression at L4-5 and L5-S1.  Of particular note, I did feel that the patient's risk of postoperative instability was very high.  I did explain to him that even without surgery, given the location of his myeloma, he may develop stability at either the L4-5 or L5-S1 level, and potentially both.  The patient did clearly understand this prior to surgery.  He did elect to proceed with surgery.  OPERATIVE DETAILS:  On September 25, 2014, the patient was brought to surgery and general endotracheal anesthesia was administered.  The patient was placed prone on a well-padded flat Jackson bed with a spinal frame.  Antibiotics were given and a time-out procedure was performed. The back was prepped and draped and a midline incision was made overlying the L4-5 and L5-S1 intervertebral spaces.  The fascia was incised at the midline.  Retractors were placed.  A lateral intraoperative radiograph did confirm the appropriate operative level. I then removed the spinous processes of L4 and L5.  I then proceeded with a central decompression starting at L5-S1 and working up to L4-5. A bilateral lateral recess decompression was also performed.  I then turned my attention towards the patient's right side, specifically, the right L5-S1 foraminal region.  Upon additional exploration of  the foraminal region, it was noted that there was substantial pressure on the exiting L5 nerve.  I then proceeded with a partial facetectomy in order to fully decompress the neural foramen.  I did completely follow the exiting L5 nerve past the L5 pedicle and out the right L5-S1 neural foramen.  I did ensure adequate and thorough and complete decompression of the nerve.  Of note, there clearly was a myeloma eroding through the L5 pedicle.  Part of this lesion did need to be debulked in order to additionally decompress the exiting L5 nerve.  In doing so, I was very happy with the decompression that I was able to accomplish at both  the L4-5 and L5-S1 regions bilaterally.  Also of note, I did feel that there was additional motion initially encountered at both the L4-5 and L5-S1 facet joints, likely from the multiple myeloma involving these regions. As discussed preoperative with the patient, I did feel that this may result in postoperative instability, and may require additional surgery. Once the decompression was completed, the wound was copiously irrigated. A 40 mg of Depo-Medrol was introduced about the epidural space in both the right and on the left side.  The bleeding was adequately controlled. The fascia was then closed using #1 Vicryl.  The subcutaneous layer was closed using 2-0 Vicryl.  The skin was closed using 3-0 Monocryl. Benzoin and Steri-Strips were applied followed by a sterile dressing. All instrument counts were correct at the termination of the procedure.  Of note, Pricilla Holm was my assistant throughout surgery, and did aid in retraction, suctioning, and closure.     Phylliss Bob, MD     MD/MEDQ  D:  09/25/2014  T:  09/26/2014  Job:  [id number]  cc:   [name], M.D.

## 2014-09-29 ENCOUNTER — Encounter (HOSPITAL_COMMUNITY): Payer: Self-pay | Admitting: Orthopedic Surgery

## 2014-09-29 MED FILL — Heparin Sodium (Porcine) Inj 1000 Unit/ML: INTRAMUSCULAR | Qty: 30 | Status: AC

## 2014-09-29 MED FILL — Sodium Chloride IV Soln 0.9%: INTRAVENOUS | Qty: 1000 | Status: AC

## 2014-10-14 ENCOUNTER — Ambulatory Visit: Payer: BLUE CROSS/BLUE SHIELD

## 2014-10-14 ENCOUNTER — Other Ambulatory Visit: Payer: BLUE CROSS/BLUE SHIELD | Admitting: Lab

## 2014-10-14 ENCOUNTER — Ambulatory Visit: Payer: BLUE CROSS/BLUE SHIELD | Admitting: Hematology & Oncology

## 2014-10-26 ENCOUNTER — Emergency Department (HOSPITAL_COMMUNITY): Payer: BLUE CROSS/BLUE SHIELD

## 2014-10-26 ENCOUNTER — Encounter (HOSPITAL_COMMUNITY): Payer: Self-pay | Admitting: Emergency Medicine

## 2014-10-26 ENCOUNTER — Emergency Department (HOSPITAL_COMMUNITY)
Admission: EM | Admit: 2014-10-26 | Discharge: 2014-10-26 | Disposition: A | Discharge status: Home / Self Care | Payer: BLUE CROSS/BLUE SHIELD | Attending: Emergency Medicine | Admitting: Emergency Medicine

## 2014-10-26 DIAGNOSIS — M549 Dorsalgia, unspecified: Secondary | ICD-10-CM | POA: Diagnosis present

## 2014-10-26 DIAGNOSIS — G629 Polyneuropathy, unspecified: Secondary | ICD-10-CM

## 2014-10-26 DIAGNOSIS — M79604 Pain in right leg: Secondary | ICD-10-CM | POA: Insufficient documentation

## 2014-10-26 DIAGNOSIS — M545 Low back pain: Secondary | ICD-10-CM | POA: Diagnosis not present

## 2014-10-26 DIAGNOSIS — Z8579 Personal history of other malignant neoplasms of lymphoid, hematopoietic and related tissues: Secondary | ICD-10-CM | POA: Diagnosis not present

## 2014-10-26 DIAGNOSIS — Z87891 Personal history of nicotine dependence: Secondary | ICD-10-CM | POA: Diagnosis not present

## 2014-10-26 DIAGNOSIS — M792 Neuralgia and neuritis, unspecified: Secondary | ICD-10-CM | POA: Insufficient documentation

## 2014-10-26 DIAGNOSIS — Z79899 Other long term (current) drug therapy: Secondary | ICD-10-CM | POA: Diagnosis not present

## 2014-10-26 DIAGNOSIS — M79605 Pain in left leg: Secondary | ICD-10-CM | POA: Insufficient documentation

## 2014-10-26 LAB — CBC WITH DIFFERENTIAL/PLATELET
BASOS ABS: 0 10*3/uL (ref 0.0–0.1)
Basophils Relative: 0 % (ref 0–1)
EOS ABS: 0.2 10*3/uL (ref 0.0–0.7)
Eosinophils Relative: 5 % (ref 0–5)
HEMATOCRIT: 34.3 % — AB (ref 39.0–52.0)
Hemoglobin: 11.6 g/dL — ABNORMAL LOW (ref 13.0–17.0)
LYMPHS ABS: 1 10*3/uL (ref 0.7–4.0)
LYMPHS PCT: 30 % (ref 12–46)
MCH: 32 pg (ref 26.0–34.0)
MCHC: 33.8 g/dL (ref 30.0–36.0)
MCV: 94.5 fL (ref 78.0–100.0)
MONO ABS: 0.4 10*3/uL (ref 0.1–1.0)
MONOS PCT: 12 % (ref 3–12)
NEUTROS ABS: 1.8 10*3/uL (ref 1.7–7.7)
NEUTROS PCT: 53 % (ref 43–77)
Platelets: 164 10*3/uL (ref 150–400)
RBC: 3.63 MIL/uL — AB (ref 4.22–5.81)
RDW: 13.9 % (ref 11.5–15.5)
WBC: 3.4 10*3/uL — ABNORMAL LOW (ref 4.0–10.5)

## 2014-10-26 LAB — BASIC METABOLIC PANEL
Anion gap: 8 (ref 5–15)
BUN: 17 mg/dL (ref 6–23)
CALCIUM: 9.4 mg/dL (ref 8.4–10.5)
CO2: 26 mmol/L (ref 19–32)
Chloride: 105 mmol/L (ref 96–112)
Creatinine, Ser: 1.26 mg/dL (ref 0.50–1.35)
GFR, EST AFRICAN AMERICAN: 80 mL/min — AB (ref >90)
GFR, EST NON AFRICAN AMERICAN: 69 mL/min — AB (ref >90)
GLUCOSE: 91 mg/dL (ref 70–99)
POTASSIUM: 4 mmol/L (ref 3.5–5.1)
Sodium: 139 mmol/L (ref 135–145)

## 2014-10-26 MED ORDER — HYDROMORPHONE HCL 1 MG/ML IJ SOLN
1.0000 mg | Freq: Once | INTRAMUSCULAR | Status: AC
  Administered 2014-10-26: 1 mg via INTRAVENOUS
  Filled 2014-10-26: qty 1

## 2014-10-26 MED ORDER — HYDROMORPHONE HCL 1 MG/ML IJ SOLN
0.5000 mg | Freq: Once | INTRAMUSCULAR | Status: AC
  Administered 2014-10-26: 0.5 mg via INTRAVENOUS
  Filled 2014-10-26: qty 1

## 2014-10-26 MED ORDER — GADOBENATE DIMEGLUMINE 529 MG/ML IV SOLN
20.0000 mL | Freq: Once | INTRAVENOUS | Status: AC | PRN
  Administered 2014-10-26: 19 mL via INTRAVENOUS

## 2014-10-26 MED ORDER — ONDANSETRON HCL 4 MG/2ML IJ SOLN
4.0000 mg | Freq: Once | INTRAMUSCULAR | Status: AC
  Administered 2014-10-26: 4 mg via INTRAVENOUS
  Filled 2014-10-26: qty 2

## 2014-10-26 NOTE — ED Provider Notes (Signed)
CSN: [id number]     Arrival date & time 10/26/14  1626 History   First MD Initiated Contact with Patient 10/26/14 1723     Chief Complaint  Patient presents with  . Back Pain  . Leg Pain   (Consider location/radiation/quality/duration/timing/severity/associated sxs/prior Treatment) HPI Comments: Patient with multiple myeloma s/p chemotherapy, multiple spinal lesions treated with radiation/kyphoplasty, h/o spinal compression fx, history of lumbar laminectomy/decompression/microdiscectomy surgery performed by Dr. Lynann Bologna on 09/25/14 due to pain radiating down both legs with numbness and tingling -- presents with worsening of back pain with radiation into legs, R>L, over the past week. He has Percocet 10/325 to take every 4 hrs prn. On an average day he does not take these. However he has needed to use them 2-3 times a day over the past several days and has not noted much benefit from them. Associated with intermittently elevated temp to 99.61F, never over 100F, over the past 2-3 weeks. Patient otherwise denies warning symptoms of back pain including: fecal incontinence, urinary retention or overflow incontinence, night sweats, waking from sleep with back pain, weight loss, IVDU, recent trauma.     Patient is a 44 y.o. male presenting with back pain and leg pain. The history is provided by the patient and medical records.  Back Pain Associated symptoms: fever (elevated temp) and leg pain   Associated symptoms: no numbness and no weakness   Leg Pain Associated symptoms: back pain and fever (elevated temp)     Past Medical History  Diagnosis Date  . History of radiation therapy 02/07/13- 02/26/13    lower L spine, upper sacrum, 35 gray in 14 fractions  . Cancer   . Multiple myeloma dx'd 01/2013  . FH: chemotherapy     Dr. Burney Gauze   Past Surgical History  Procedure Laterality Date  . Portacath placement    . Bone marrow transplant    . Kyphoplasty  05/20/14  . Lumbar  laminectomy/decompression microdiscectomy N/A 09/25/2014    Procedure: LUMBAR LAMINECTOMY/DECOMPRESSION MICRODISCECTOMY;  Surgeon: Sinclair Ship, MD;  Location: [facility];  Service: Orthopedics;  Laterality: N/A;  Lumbar 4-5, L5-S1  decompression   Family History  Problem Relation Age of Onset  . Diabetic kidney disease Mother   . Hypertension Mother   . Heart attack Mother   . Kidney failure Mother   . Coronary artery disease Mother   . HIV Father    History  Substance Use Topics  . Smoking status: Former Smoker -- 1.00 packs/day for 15 years    Types: Cigarettes    Start date: 06/29/1988    Quit date: 06/29/2003  . Smokeless tobacco: Never Used     Comment: quit smoking 10 years ago  . Alcohol Use: No    Review of Systems  Constitutional: Positive for fever (elevated temp). Negative for unexpected weight change.  Gastrointestinal: Negative for constipation.       Neg for fecal incontinence  Genitourinary: Negative for hematuria, flank pain and difficulty urinating.       Negative for urinary incontinence or retention  Musculoskeletal: Positive for back pain.  Neurological: Negative for weakness and numbness.       Negative for saddle paresthesias    Allergies  Review of patient's allergies indicates no known allergies.  Home Medications   Prior to Admission medications   Medication Sig Start Date End Date Taking? Authorizing Provider  acetaminophen (TYLENOL) 500 MG tablet Take 500 mg by mouth every 6 (six) hours as needed for moderate pain  or headache.    Historical Provider, MD  acyclovir (ZOVIRAX) 800 MG tablet Take 1 tablet (800 mg total) by mouth 2 (two) times daily. 06/06/14   [name], MD  bisacodyl (DULCOLAX) 5 MG EC tablet Take 5 mg by mouth daily as needed for moderate constipation.    Historical Provider, MD  calcium-vitamin D (OSCAL WITH D) 500-200 MG-UNIT per tablet Take 1 tablet by mouth daily with breakfast.    Historical Provider, MD  gabapentin  (NEURONTIN) 300 MG capsule Take 300-600 mg by mouth 2 (two) times daily. 321m in the morning and 6057min the evening 06/06/14   Historical Provider, MD  lenalidomide (REVLIMID) 10 MG capsule Take 1 capsule (10 mg total) by mouth daily. AuJosem Kaufmann4#[id number]/28/16   PeVolanda NapoleonMD  oxyCODONE-acetaminophen (PERCOCET) 10-325 MG per tablet Take 1 tablet by mouth every 4 (four) hours as needed for pain. 08/28/14   PeVolanda NapoleonMD  prochlorperazine (COMPAZINE) 10 MG tablet Take 10 mg by mouth every 8 (eight) hours as needed for nausea or vomiting.    Historical Provider, MD   BP 124/88 mmHg  Pulse 75  Temp(Src) 98.6 F (37 C) (Oral)  Resp 20  SpO2 99%   Physical Exam  Constitutional: He appears well-developed and well-nourished.  HENT:  Head: Normocephalic and atraumatic.  Eyes: Conjunctivae are normal.  Neck: Normal range of motion.  Abdominal: Soft. There is no tenderness. There is no CVA tenderness.  Musculoskeletal: Normal range of motion. He exhibits no tenderness.  No step-off noted with palpation of spine.   Neurological: He is alert. He has normal reflexes. No sensory deficit. He exhibits normal muscle tone.  5/5 strength in entire lower extremities bilaterally. Patient reports decreased sensation distally, unchanged from prior  Skin: Skin is warm and dry.  Psychiatric: He has a normal mood and affect.  Nursing note and vitals reviewed.   ED Course  Procedures (including critical care time) Labs Review Labs Reviewed  CBC WITH DIFFERENTIAL/PLATELET - Abnormal; Notable for the following:    WBC 3.4 (*)    RBC 3.63 (*)    Hemoglobin 11.6 (*)    HCT 34.3 (*)    All other components within normal limits  BASIC METABOLIC PANEL - Abnormal; Notable for the following:    GFR calc non Af Amer 69 (*)    GFR calc Af Amer 80 (*)    All other components within normal limits    Imaging Review Mr Lumbar Spine W Wo Contrast  10/26/2014   CLINICAL DATA:  Spinal decompression surgery  in February. Low back pain extending to the legs, worsening over the last week. Multiple myeloma.  EXAM: MRI LUMBAR SPINE WITHOUT AND WITH CONTRAST  TECHNIQUE: Multiplanar and multiecho pulse sequences of the lumbar spine were obtained without and with intravenous contrast.  CONTRAST:  1956mULTIHANCE GADOBENATE DIMEGLUMINE 529 MG/ML IV SOLN  COMPARISON:  MRI 08/28/2014. CT 09/04/2014. MRI 06/19/2014. MRI 02/26/2014. Multiple previous studies.  FINDINGS: Since the previous studies, no change is seen at L4 or above. Previously augmented pathologic fracture at T12 without evidence of tumor growth or neural compression. Chronic inferior endplate Schmorl's node at L1 in tiny superior endplate Schmorl's node at L2 are unchanged previously seen small foci of T2 marrow signal continue to become less evident. This is consistent with myeloma response to treatment. Tiny focus of enhancement in the posterior superior corner of L4 on the right is becoming less apparent. Advanced fatty marrow changes at  L4, L5 and S1 consistent with previous radiation.  At the L5 level, the patient has undergone wide posterior decompression. This includes resection of part of the spinous process of L4, and of the laminae the and spinous process of L5. There is a fluid collection in this space measuring 4.3 cm cephalocaudal, 4 cm front to back and 3 cm right to left. There are multiple internal septations. This could be resolving postoperative hematoma/ seroma, but CSF leak is not excluded. This collection shows peripheral contrast enhancement.  Chronic bony changes affecting the right side of the L5 vertebral body, the right pedicle in the posterior elements on the right are unchanged, consistent with treated myeloma in this location. Bony changes are cystic without solid internal enhancement to indicate residual or recurrent viable tumor. Small marrow focus in the right iliac bone is unchanged and also inactive. There is continued foraminal  narrowing on the right at L5-S1 that could affect the exiting L5 nerve root. The foramen on the left is mildly narrowed but left-sided neural compression is not demonstrated.  IMPRESSION: Interval posterior decompression at L5. Fluid collection in the postoperative space that could be resolving seroma/hematoma shows septation and peripheral enhancement. CSF leak is not excluded based on this imaging appearance.  Chronic treated bone changes on the right at L5 are unchanged and continue to appear "inactive" consistent with treated tumor. Foraminal stenosis on the right continues to have potential to affect/compress the exiting right L5 nerve root.   Electronically Signed   By: Nelson Chimes M.D.   On: 10/26/2014 20:32     EKG Interpretation None       5:29 PM Patient seen and examined.   Vital signs reviewed and are as follows: BP 124/88 mmHg  Pulse 75  Temp(Src) 98.6 F (37 C) (Oral)  Resp 20  SpO2 99%  7:22 PM Pt in MRI.   10:45 PM Findings were discussed and reviewed previously with Dr. Winfred Leeds.   I reviewed findings with Dr. Rhona Raider who is on-call for Dr. Lynann Bologna tonight. He agrees nothing acute, emergent. He asked that I have patient call the office tomorrow.   Pt updated. His pain is well controlled, but not gone, after 2nd dose of IV pain medication.   He requests additional pain medication prior to discharge so he is not hurting when he returns home. I have ordered additional 0.60m dilaudid. Patient awake and alert.   Patient encouraged to take home oral pain medications on a scheduled basis for the next several days.  Patient urged to return with worsening symptoms or other concerns. Patient verbalized understanding and agrees with plan.     MDM   Final diagnoses:  Back pain  Neuropathy   Patient with back pain with complex history as described above. There is no sign of infection or new structural problem with L-spine. No neurological deficits. This seems to be  exacerbation of pain related to back problems as described. Pain controlled in ED. Patient has pain medication at home. He will f/u with his orthopedist this week.    JCarlisle Cater PA-C 10/26/14 [place] MD 10/27/14 [phone]

## 2014-10-26 NOTE — Discharge Instructions (Signed)
Please read and follow all provided instructions. ° °Your diagnoses today include:  °1. Back pain   °2. Neuropathy   ° ° °Tests performed today include: °· Vital signs - see below for your results today °· MRI - shows post-operative changes but no infection or new changes with the multiple myeloma in the spine ° °Medications prescribed:  °· None ° °Take any prescribed medications only as directed. ° °Home care instructions:  °· Follow any educational materials contained in this packet °· Please rest, use ice or heat on your back for the next several days °· Do not lift, push, pull anything more than 10 pounds for the next week ° °Follow-up instructions: °Call your orthopedic surgeon tomorrow for a follow-up appointment. Take your pain medication on a schedule, 1 tablet every 4 hours, for the next couple of days to help keep pain controlled.   ° °Return instructions:  °SEEK IMMEDIATE MEDICAL ATTENTION IF YOU HAVE: °· New numbness, tingling, weakness, or problem with the use of your arms or legs °· Severe back pain not relieved with medications °· Loss control of your bowels or bladder °· Increasing pain in any areas of the body (such as chest or abdominal pain) °· Shortness of breath, dizziness, or fainting.  °· Worsening nausea (feeling sick to your stomach), vomiting, fever, or sweats °· Any other emergent concerns regarding your health  ° °Additional Information: ° °Your vital signs today were: °BP 120/73 mmHg   Pulse 67   Temp(Src) 98.2 °F (36.8 °C) (Oral)   Resp 14   SpO2 90% °If your blood pressure (BP) was elevated above 135/85 this visit, please have this repeated by your doctor within one month. °-------------- ° ° °

## 2014-10-26 NOTE — ED Provider Notes (Signed)
Complains of low back pain radiating to both right foot and left thigh. Patient had laminectomy for 25th 2016. Maximum temperature 99.8 this past week. No loss of bladder or bowel control. On exam no distress Glasgow Coma Score 15. Back well-healed surgical scar lumbar area. No point tenderness. Gait is normal. DTR symmetric bilaterally at knee jerk ankle jerk. Toes downward going bilaterally  Orlie Dakin, MD 10/26/14 1824

## 2014-10-26 NOTE — ED Notes (Signed)
Pt states that he had spinal laminectomy in feb.  C/o low back pain radiating down legs since the procedure but got worse over the last week.  Pt has hx of multiple myeloma but has not been on chemo in 3 months.

## 2014-10-26 NOTE — ED Notes (Signed)
Patient transported to MRI 

## 2014-10-27 ENCOUNTER — Ambulatory Visit: Payer: BLUE CROSS/BLUE SHIELD

## 2014-10-27 ENCOUNTER — Other Ambulatory Visit: Payer: BLUE CROSS/BLUE SHIELD

## 2014-10-27 ENCOUNTER — Ambulatory Visit: Payer: BLUE CROSS/BLUE SHIELD | Admitting: Family

## 2014-10-28 ENCOUNTER — Ambulatory Visit (HOSPITAL_BASED_OUTPATIENT_CLINIC_OR_DEPARTMENT_OTHER): Payer: BLUE CROSS/BLUE SHIELD

## 2014-10-28 ENCOUNTER — Telehealth: Payer: Self-pay | Admitting: Hematology & Oncology

## 2014-10-28 ENCOUNTER — Ambulatory Visit (HOSPITAL_BASED_OUTPATIENT_CLINIC_OR_DEPARTMENT_OTHER): Payer: BLUE CROSS/BLUE SHIELD | Admitting: Family

## 2014-10-28 ENCOUNTER — Encounter: Payer: Self-pay | Admitting: Family

## 2014-10-28 VITALS — BP 150/96 | HR 77 | Temp 98.0°F | Resp 18 | Ht 71.0 in | Wt 215.0 lb

## 2014-10-28 DIAGNOSIS — C9000 Multiple myeloma not having achieved remission: Secondary | ICD-10-CM

## 2014-10-28 DIAGNOSIS — Z9484 Stem cells transplant status: Secondary | ICD-10-CM | POA: Diagnosis not present

## 2014-10-28 DIAGNOSIS — Z5112 Encounter for antineoplastic immunotherapy: Secondary | ICD-10-CM

## 2014-10-28 LAB — CBC WITH DIFFERENTIAL (CANCER CENTER ONLY)
BASO#: 0 10*3/uL (ref 0.0–0.2)
BASO%: 0.3 % (ref 0.0–2.0)
EOS ABS: 0.2 10*3/uL (ref 0.0–0.5)
EOS%: 4.7 % (ref 0.0–7.0)
HCT: 31.9 % — ABNORMAL LOW (ref 38.7–49.9)
HGB: 11 g/dL — ABNORMAL LOW (ref 13.0–17.1)
LYMPH#: 1.2 10*3/uL (ref 0.9–3.3)
LYMPH%: 35 % (ref 14.0–48.0)
MCH: 32.6 pg (ref 28.0–33.4)
MCHC: 34.5 g/dL (ref 32.0–35.9)
MCV: 95 fL (ref 82–98)
MONO#: 0.5 10*3/uL (ref 0.1–0.9)
MONO%: 14.5 % — ABNORMAL HIGH (ref 0.0–13.0)
NEUT%: 45.5 % (ref 40.0–80.0)
NEUTROS ABS: 1.5 10*3/uL (ref 1.5–6.5)
PLATELETS: 164 10*3/uL (ref 145–400)
RBC: 3.37 10*6/uL — AB (ref 4.20–5.70)
RDW: 13.4 % (ref 11.1–15.7)
WBC: 3.4 10*3/uL — ABNORMAL LOW (ref 4.0–10.0)

## 2014-10-28 LAB — CMP (CANCER CENTER ONLY)
ALBUMIN: 4.1 g/dL (ref 3.3–5.5)
ALT(SGPT): 15 U/L (ref 10–47)
AST: 19 U/L (ref 11–38)
Alkaline Phosphatase: 61 U/L (ref 26–84)
BUN: 17 mg/dL (ref 7–22)
CALCIUM: 9.8 mg/dL (ref 8.0–10.3)
CHLORIDE: 106 meq/L (ref 98–108)
CO2: 30 mEq/L (ref 18–33)
Creat: 1.3 mg/dl — ABNORMAL HIGH (ref 0.6–1.2)
Glucose, Bld: 95 mg/dL (ref 73–118)
Potassium: 4.1 mEq/L (ref 3.3–4.7)
Sodium: 144 mEq/L (ref 128–145)
Total Bilirubin: 1.5 mg/dl (ref 0.20–1.60)
Total Protein: 7.5 g/dL (ref 6.4–8.1)

## 2014-10-28 MED ORDER — HEPARIN SOD (PORK) LOCK FLUSH 100 UNIT/ML IV SOLN
500.0000 [IU] | Freq: Once | INTRAVENOUS | Status: AC
  Administered 2014-10-28: 500 [IU] via INTRAVENOUS
  Filled 2014-10-28: qty 5

## 2014-10-28 MED ORDER — FENTANYL 25 MCG/HR TD PT72: 25.0000 ug | MEDICATED_PATCH | TRANSDERMAL | Status: DC

## 2014-10-28 MED ORDER — ZOLEDRONIC ACID 4 MG/100ML IV SOLN
4.0000 mg | Freq: Once | INTRAVENOUS | Status: AC
  Administered 2014-10-28: 4 mg via INTRAVENOUS
  Filled 2014-10-28: qty 100

## 2014-10-28 MED ORDER — ONDANSETRON HCL 8 MG PO TABS
ORAL_TABLET | ORAL | Status: AC
  Filled 2014-10-28: qty 1

## 2014-10-28 MED ORDER — SODIUM CHLORIDE 0.9 % IJ SOLN
10.0000 mL | INTRAMUSCULAR | Status: DC | PRN
  Administered 2014-10-28: 10 mL via INTRAVENOUS
  Filled 2014-10-28: qty 10

## 2014-10-28 MED ORDER — ONDANSETRON HCL 8 MG PO TABS
8.0000 mg | ORAL_TABLET | Freq: Once | ORAL | Status: AC
  Administered 2014-10-28: 8 mg via ORAL

## 2014-10-28 MED ORDER — BORTEZOMIB CHEMO SQ INJECTION 3.5 MG (2.5MG/ML)
1.3000 mg/m2 | Freq: Once | INTRAMUSCULAR | Status: AC
  Administered 2014-10-28: 2.75 mg via SUBCUTANEOUS
  Filled 2014-10-28: qty 2.75

## 2014-10-28 NOTE — Telephone Encounter (Signed)
F1219 Zoledronic Acid (ZOMETA) 4 mg IVPB APPROVED [id number]  10/28/2014 10-28-2015  A year      J3489 ondansetron (ZOFRAN) tablet 8 mg NPR      J1642 Heparin                                      NPR    J9041 bortezomib SQ (VELCADE) chemo injection 2.75 mg APPROVED [id number]  10/28/2014 10-28-2015  11 cycles only   P: AIM @ [phone]

## 2014-10-28 NOTE — Progress Notes (Signed)
Hematology and Oncology Follow Up Visit  Mark [name] [id number] [birth year] 44 y.o. 10/28/2014   Principle Diagnosis:  Kappa light chain myeloma  Current Therapy:   Revlimid 10mg  po q day (21/7)  Zometa 4 mg IV q. month  Velcade q 3 week dosing    Interim History: Mark [name] is here today for follow-up. He had surgery for his spinal stenosis. He is still in a lot of pain and is asking if we can try putting him on a pain patch to see if that will help. He did go to the ED over the weekend because of the pain and his MRI was negative. He deniesfever, chills, n/v, cough, blurred vision, constipation, diarrhea, rash, SOB, chest pain, palpitations, abdominal pain, problems urinating or blood in urine or stool.  No swelling, numbness or tenderness in his extremities.He is still having some tingling in his feet. He is taking Neurontin for this.   His appetite is good and he is drinking plenty of fluids. His weight is stable.   In February, his kappa light chain was 2.57 mg/dl and M-spike was 0.23 g/dl.   Medications:    Medication List       This list is accurate as of: 10/28/14  9:42 AM.  Always use your most recent med list.               acetaminophen 500 MG tablet  Commonly known as:  TYLENOL  Take 500 mg by mouth every 6 (six) hours as needed for moderate pain or headache.     acyclovir 800 MG tablet  Commonly known as:  ZOVIRAX  Take 1 tablet (800 mg total) by mouth 2 (two) times daily.     aspirin EC 325 MG tablet  Take 325 mg by mouth daily.     bisacodyl 5 MG EC tablet  Commonly known as:  DULCOLAX  Take 5 mg by mouth daily as needed for moderate constipation.     calcium gluconate 500 MG tablet  Take 1 tablet by mouth daily.     gabapentin 300 MG capsule  Commonly known as:  NEURONTIN  Take 300-600 mg by mouth 2 (two) times daily. 300mg  in the morning and 600mg  in the evening     ibuprofen 200 MG tablet  Commonly known as:  ADVIL,MOTRIN  Take 400 mg by  mouth every 6 (six) hours as needed for headache or moderate pain.     lenalidomide 10 MG capsule  Commonly known as:  REVLIMID  Take 1 capsule (10 mg total) by mouth daily. Auth #[id number]     oxyCODONE-acetaminophen 10-325 MG per tablet  Commonly known as:  PERCOCET  Take 1 tablet by mouth every 4 (four) hours as needed for pain.     prochlorperazine 10 MG tablet  Commonly known as:  COMPAZINE  Take 10 mg by mouth every 8 (eight) hours as needed for nausea or vomiting.        Allergies: No Known Allergies  Past Medical History, Surgical history, Social history, and Family History were reviewed and updated.  Review of Systems: All other 10 point review of systems is negative.   Physical Exam:  vitals were not taken for this visit.  Wt Readings from Last 3 Encounters:  09/25/14 213 lb (96.616 kg)  09/22/14 213 lb (96.616 kg)  08/28/14 208 lb (94.348 kg)    Ocular: Sclerae unicteric, pupils equal, round and reactive to light Ear-nose-throat: Oropharynx clear, dentition fair Lymphatic: No cervical or supraclavicular adenopathy  Lungs no rales or rhonchi, good excursion bilaterally Heart regular rate and rhythm, no murmur appreciated Abd soft, nontender, positive bowel sounds MSK no focal spinal tenderness, no joint edema Neuro: non-focal, well-oriented, appropriate affect  Lab Results  Component Value Date   WBC 3.4* 10/26/2014   HGB 11.6* 10/26/2014   HCT 34.3* 10/26/2014   MCV 94.5 10/26/2014   PLT 164 10/26/2014   No results found for: FERRITIN, IRON, TIBC, UIBC, IRONPCTSAT Lab Results  Component Value Date   RBC 3.63* 10/26/2014   Lab Results  Component Value Date   KPAFRELGTCHN 2.57* 09/22/2014   LAMBDASER 1.88 09/22/2014   KAPLAMBRATIO 1.37 09/22/2014   Lab Results  Component Value Date   IGGSERUM 1340 09/22/2014   IGA 103 09/22/2014   IGMSERUM 17* 09/22/2014   Lab Results  Component Value Date   TOTALPROTELP 7.0 09/22/2014   ALBUMINELP 61.2  09/22/2014   A1GS 4.1 09/22/2014   A2GS 7.8 09/22/2014   BETS 5.4 09/22/2014   BETA2SER 4.5 09/22/2014   GAMS 17.0 09/22/2014   MSPIKE 0.23 09/22/2014   SPEI * 09/22/2014     Chemistry      Component Value Date/Time   NA 139 10/26/2014 1827   NA 141 09/22/2014 0859   NA 145 02/21/2013 0908   K 4.0 10/26/2014 1827   K 4.3 09/22/2014 0859   K 3.8 02/21/2013 0908   CL 105 10/26/2014 1827   CL 100 09/22/2014 0859   CO2 26 10/26/2014 1827   CO2 29 09/22/2014 0859   CO2 32* 02/21/2013 0908   BUN 17 10/26/2014 1827   BUN 14 09/22/2014 0859   BUN 19.5 02/21/2013 0908   CREATININE 1.26 10/26/2014 1827   CREATININE 1.2 09/22/2014 0859   CREATININE 1.5* 02/21/2013 0908      Component Value Date/Time   CALCIUM 9.4 10/26/2014 1827   CALCIUM 8.5 09/22/2014 0859   CALCIUM 17.7 Repeated and Verified* 02/21/2013 0908   CALCIUM >15.0* 02/01/2013 1230   ALKPHOS 44 09/22/2014 0859   ALKPHOS 51 09/18/2014 0919   AST 20 09/22/2014 0859   AST 25 09/18/2014 0919   ALT 13 09/22/2014 0859   ALT 17 09/18/2014 0919   BILITOT 1.70* 09/22/2014 0859   BILITOT 2.4* 09/18/2014 0919     Impression and Plan: Mark [name] is 44 year old African American male with kappa light chain myeloma. He did undergo stem cell transplant for his myeloma on September 11, 2013 at [facility].  He has had problems with spinal stenosis and had surgery last month. He is still having a lot of back pain. We will start him on a 25 mcg Fentanyl Duragesic patch. Hopefully this will help.  We will restart him on his Revlemid and Velcade.  He will get Zometa today also.  We will see him back in 3 weeks for labs and follow-up.  He know to call here with any questions or concerns. We can certainly see him sooner if need be.   [name], NP 3/29/20169:42 AM

## 2014-10-28 NOTE — Patient Instructions (Signed)
Bortezomib injection What is this medicine? BORTEZOMIB (bor TEZ oh mib) is a chemotherapy drug. It slows the growth of cancer cells. This medicine is used to treat multiple myeloma, and certain lymphomas, such as mantle-cell lymphoma. This medicine may be used for other purposes; ask your health care provider or pharmacist if you have questions. COMMON BRAND NAME(S): Velcade What should I tell my health care provider before I take this medicine? They need to know if you have any of these conditions: -diabetes -heart disease -irregular heartbeat -liver disease -on hemodialysis -low blood counts, like low white blood cells, platelets, or hemoglobin -peripheral neuropathy -taking medicine for blood pressure -an unusual or allergic reaction to bortezomib, mannitol, boron, other medicines, foods, dyes, or preservatives -pregnant or trying to get pregnant -breast-feeding How should I use this medicine? This medicine is for injection into a vein or for injection under the skin. It is given by a health care professional in a hospital or clinic setting. Talk to your pediatrician regarding the use of this medicine in children. Special care may be needed. Overdosage: If you think you have taken too much of this medicine contact a poison control center or emergency room at once. NOTE: This medicine is only for you. Do not share this medicine with others. What if I miss a dose? It is important not to miss your dose. Call your doctor or health care professional if you are unable to keep an appointment. What may interact with this medicine? This medicine may interact with the following medications: -ketoconazole -rifampin -ritonavir -St. John's Wort This list may not describe all possible interactions. Give your health care provider a list of all the medicines, herbs, non-prescription drugs, or dietary supplements you use. Also tell them if you smoke, drink alcohol, or use illegal drugs. Some items  may interact with your medicine. What should I watch for while using this medicine? Visit your doctor for checks on your progress. This drug may make you feel generally unwell. This is not uncommon, as chemotherapy can affect healthy cells as well as cancer cells. Report any side effects. Continue your course of treatment even though you feel ill unless your doctor tells you to stop. You may get drowsy or dizzy. Do not drive, use machinery, or do anything that needs mental alertness until you know how this medicine affects you. Do not stand or sit up quickly, especially if you are an older patient. This reduces the risk of dizzy or fainting spells. In some cases, you may be given additional medicines to help with side effects. Follow all directions for their use. Call your doctor or health care professional for advice if you get a fever, chills or sore throat, or other symptoms of a cold or flu. Do not treat yourself. This drug decreases your body's ability to fight infections. Try to avoid being around people who are sick. This medicine may increase your risk to bruise or bleed. Call your doctor or health care professional if you notice any unusual bleeding. You may need blood work done while you are taking this medicine. In some patients, this medicine may cause a serious brain infection that may cause death. If you have any problems seeing, thinking, speaking, walking, or standing, tell your doctor right away. If you cannot reach your doctor, urgently seek other source of medical care. Do not become pregnant while taking this medicine. Women should inform their doctor if they wish to become pregnant or think they might be pregnant. There is  a potential for serious side effects to an unborn child. Talk to your health care professional or pharmacist for more information. Do not breast-feed an infant while taking this medicine. Check with your doctor or health care professional if you get an attack of  severe diarrhea, nausea and vomiting, or if you sweat a lot. The loss of too much body fluid can make it dangerous for you to take this medicine. What side effects may I notice from receiving this medicine? Side effects that you should report to your doctor or health care professional as soon as possible: -allergic reactions like skin rash, itching or hives, swelling of the face, lips, or tongue -breathing problems -changes in hearing -changes in vision -fast, irregular heartbeat -feeling faint or lightheaded, falls -pain, tingling, numbness in the hands or feet -right upper belly pain -seizures -swelling of the ankles, feet, hands -unusual bleeding or bruising -unusually weak or tired -vomiting -yellowing of the eyes or skin Side effects that usually do not require medical attention (report to your doctor or health care professional if they continue or are bothersome): -changes in emotions or moods -constipation -diarrhea -loss of appetite -headache -irritation at site where injected -nausea This list may not describe all possible side effects. Call your doctor for medical advice about side effects. You may report side effects to FDA at 1-800-FDA-1088. Where should I keep my medicine? This drug is given in a hospital or clinic and will not be stored at home. NOTE: This sheet is a summary. It may not cover all possible information. If you have questions about this medicine, talk to your doctor, pharmacist, or health care provider.  2015, Elsevier/Gold Standard. (2013-05-13 12:46:32) Zoledronic Acid injection (Hypercalcemia, Oncology) What is this medicine? ZOLEDRONIC ACID (ZOE le dron ik AS id) lowers the amount of calcium loss from bone. It is used to treat too much calcium in your blood from cancer. It is also used to prevent complications of cancer that has spread to the bone. This medicine may be used for other purposes; ask your health care provider or pharmacist if you have  questions. COMMON BRAND NAME(S): Zometa What should I tell my health care provider before I take this medicine? They need to know if you have any of these conditions: -aspirin-sensitive asthma -cancer, especially if you are receiving medicines used to treat cancer -dental disease or wear dentures -infection -kidney disease -receiving corticosteroids like dexamethasone or prednisone -an unusual or allergic reaction to zoledronic acid, other medicines, foods, dyes, or preservatives -pregnant or trying to get pregnant -breast-feeding How should I use this medicine? This medicine is for infusion into a vein. It is given by a health care professional in a hospital or clinic setting. Talk to your pediatrician regarding the use of this medicine in children. Special care may be needed. Overdosage: If you think you have taken too much of this medicine contact a poison control center or emergency room at once. NOTE: This medicine is only for you. Do not share this medicine with others. What if I miss a dose? It is important not to miss your dose. Call your doctor or health care professional if you are unable to keep an appointment. What may interact with this medicine? -certain antibiotics given by injection -NSAIDs, medicines for pain and inflammation, like ibuprofen or naproxen -some diuretics like bumetanide, furosemide -teriparatide -thalidomide This list may not describe all possible interactions. Give your health care provider a list of all the medicines, herbs, non-prescription drugs, or dietary   supplements you use. Also tell them if you smoke, drink alcohol, or use illegal drugs. Some items may interact with your medicine. What should I watch for while using this medicine? Visit your doctor or health care professional for regular checkups. It may be some time before you see the benefit from this medicine. Do not stop taking your medicine unless your doctor tells you to. Your doctor may  order blood tests or other tests to see how you are doing. Women should inform their doctor if they wish to become pregnant or think they might be pregnant. There is a potential for serious side effects to an unborn child. Talk to your health care professional or pharmacist for more information. You should make sure that you get enough calcium and vitamin D while you are taking this medicine. Discuss the foods you eat and the vitamins you take with your health care professional. Some people who take this medicine have severe bone, joint, and/or muscle pain. This medicine may also increase your risk for jaw problems or a broken thigh bone. Tell your doctor right away if you have severe pain in your jaw, bones, joints, or muscles. Tell your doctor if you have any pain that does not go away or that gets worse. Tell your dentist and dental surgeon that you are taking this medicine. You should not have major dental surgery while on this medicine. See your dentist to have a dental exam and fix any dental problems before starting this medicine. Take good care of your teeth while on this medicine. Make sure you see your dentist for regular follow-up appointments. What side effects may I notice from receiving this medicine? Side effects that you should report to your doctor or health care professional as soon as possible: -allergic reactions like skin rash, itching or hives, swelling of the face, lips, or tongue -anxiety, confusion, or depression -breathing problems -changes in vision -eye pain -feeling faint or lightheaded, falls -jaw pain, especially after dental work -mouth sores -muscle cramps, stiffness, or weakness -trouble passing urine or change in the amount of urine Side effects that usually do not require medical attention (report to your doctor or health care professional if they continue or are bothersome): -bone, joint, or muscle pain -constipation -diarrhea -fever -hair loss -irritation  at site where injected -loss of appetite -nausea, vomiting -stomach upset -trouble sleeping -trouble swallowing -weak or tired This list may not describe all possible side effects. Call your doctor for medical advice about side effects. You may report side effects to FDA at 1-800-FDA-1088. Where should I keep my medicine? This drug is given in a hospital or clinic and will not be stored at home. NOTE: This sheet is a summary. It may not cover all possible information. If you have questions about this medicine, talk to your doctor, pharmacist, or health care provider.  2015, [employer]vier/Gold Standard. (2012-12-27 13:03:13)  

## 2014-10-30 ENCOUNTER — Other Ambulatory Visit: Payer: Self-pay | Admitting: Nurse Practitioner

## 2014-10-30 LAB — PROTEIN ELECTROPHORESIS, SERUM, WITH REFLEX
ABNORMAL PROTEIN BAND1: 0.2 g/dL
ALPHA-1-GLOBULIN: 0.3 g/dL (ref 0.2–0.3)
Albumin ELP: 4.5 g/dL (ref 3.8–4.8)
Alpha-2-Globulin: 0.7 g/dL (ref 0.5–0.9)
BETA GLOBULIN: 0.4 g/dL (ref 0.4–0.6)
Beta 2: 0.4 g/dL (ref 0.2–0.5)
GAMMA GLOBULIN: 1.2 g/dL (ref 0.8–1.7)
TOTAL PROTEIN, SERUM ELECTROPHOR: 7.4 g/dL (ref 6.1–8.1)

## 2014-10-30 LAB — KAPPA/LAMBDA LIGHT CHAINS
KAPPA LAMBDA RATIO: 1.49 (ref 0.26–1.65)
Kappa free light chain: 2.57 mg/dL — ABNORMAL HIGH (ref 0.33–1.94)
LAMBDA FREE LGHT CHN: 1.72 mg/dL (ref 0.57–2.63)

## 2014-10-30 LAB — IFE INTERPRETATION

## 2014-10-30 LAB — IGG, IGA, IGM
IGG (IMMUNOGLOBIN G), SERUM: 1210 mg/dL (ref 650–1600)
IGM, SERUM: 28 mg/dL — AB (ref 41–251)
IgA: 85 mg/dL (ref 68–379)

## 2014-10-30 MED ORDER — AMOXICILLIN-POT CLAVULANATE 875-125 MG PO TABS: 1.0000 | ORAL_TABLET | Freq: Two times a day (BID) | ORAL | Status: DC

## 2014-10-30 NOTE — Telephone Encounter (Signed)
Pt called stating he has had a low grade temp x 3 days .However last night and this morning his Tmax is 102.0. Denies pain, discomfort, chills, cough or congestion. Dr. Marin Olp is aware and antibiotics will be started. Pt is aware to pick up meds from pharmacy.

## 2014-10-31 ENCOUNTER — Encounter (HOSPITAL_COMMUNITY): Payer: Self-pay | Admitting: Emergency Medicine

## 2014-10-31 ENCOUNTER — Emergency Department (HOSPITAL_COMMUNITY)
Admission: EM | Admit: 2014-10-31 | Discharge: 2014-11-01 | Disposition: A | Discharge status: Home / Self Care | Payer: BLUE CROSS/BLUE SHIELD | Attending: Emergency Medicine | Admitting: Emergency Medicine

## 2014-10-31 DIAGNOSIS — J029 Acute pharyngitis, unspecified: Secondary | ICD-10-CM | POA: Diagnosis not present

## 2014-10-31 DIAGNOSIS — Z87891 Personal history of nicotine dependence: Secondary | ICD-10-CM | POA: Diagnosis not present

## 2014-10-31 DIAGNOSIS — M791 Myalgia, unspecified site: Secondary | ICD-10-CM

## 2014-10-31 DIAGNOSIS — Z79899 Other long term (current) drug therapy: Secondary | ICD-10-CM | POA: Diagnosis not present

## 2014-10-31 DIAGNOSIS — Z7982 Long term (current) use of aspirin: Secondary | ICD-10-CM | POA: Insufficient documentation

## 2014-10-31 DIAGNOSIS — R509 Fever, unspecified: Secondary | ICD-10-CM | POA: Diagnosis present

## 2014-10-31 DIAGNOSIS — Z8589 Personal history of malignant neoplasm of other organs and systems: Secondary | ICD-10-CM | POA: Insufficient documentation

## 2014-10-31 DIAGNOSIS — E860 Dehydration: Secondary | ICD-10-CM | POA: Diagnosis not present

## 2014-10-31 MED ORDER — ACETAMINOPHEN 325 MG PO TABS
650.0000 mg | ORAL_TABLET | Freq: Four times a day (QID) | ORAL | Status: DC | PRN
  Administered 2014-10-31 – 2014-11-01 (×2): 650 mg via ORAL
  Filled 2014-10-31 (×2): qty 2

## 2014-10-31 NOTE — ED Notes (Signed)
Pt reports headache, fever and sore throat since wed. Pt states he feels nauseous with body aches.

## 2014-11-01 ENCOUNTER — Emergency Department (HOSPITAL_COMMUNITY): Payer: BLUE CROSS/BLUE SHIELD

## 2014-11-01 LAB — CBC WITH DIFFERENTIAL/PLATELET
BASOS ABS: 0 10*3/uL (ref 0.0–0.1)
Basophils Relative: 0 % (ref 0–1)
EOS PCT: 0 % (ref 0–5)
Eosinophils Absolute: 0 10*3/uL (ref 0.0–0.7)
HCT: 36.8 % — ABNORMAL LOW (ref 39.0–52.0)
Hemoglobin: 12.3 g/dL — ABNORMAL LOW (ref 13.0–17.0)
LYMPHS ABS: 0.7 10*3/uL (ref 0.7–4.0)
LYMPHS PCT: 10 % — AB (ref 12–46)
MCH: 31.9 pg (ref 26.0–34.0)
MCHC: 33.4 g/dL (ref 30.0–36.0)
MCV: 95.6 fL (ref 78.0–100.0)
Monocytes Absolute: 0.9 10*3/uL (ref 0.1–1.0)
Monocytes Relative: 13 % — ABNORMAL HIGH (ref 3–12)
Neutro Abs: 5.1 10*3/uL (ref 1.7–7.7)
Neutrophils Relative %: 77 % (ref 43–77)
PLATELETS: 127 10*3/uL — AB (ref 150–400)
RBC: 3.85 MIL/uL — ABNORMAL LOW (ref 4.22–5.81)
RDW: 13.6 % (ref 11.5–15.5)
WBC: 6.7 10*3/uL (ref 4.0–10.5)

## 2014-11-01 LAB — URINALYSIS, ROUTINE W REFLEX MICROSCOPIC
Bilirubin Urine: NEGATIVE
Glucose, UA: NEGATIVE mg/dL
Hgb urine dipstick: NEGATIVE
KETONES UR: NEGATIVE mg/dL
Leukocytes, UA: NEGATIVE
Nitrite: NEGATIVE
Protein, ur: 30 mg/dL — AB
Specific Gravity, Urine: 1.024 (ref 1.005–1.030)
Urobilinogen, UA: 0.2 mg/dL (ref 0.0–1.0)
pH: 6 (ref 5.0–8.0)

## 2014-11-01 LAB — URINE MICROSCOPIC-ADD ON

## 2014-11-01 LAB — RAPID STREP SCREEN (MED CTR MEBANE ONLY): Streptococcus, Group A Screen (Direct): NEGATIVE

## 2014-11-01 LAB — COMPREHENSIVE METABOLIC PANEL
ALK PHOS: 61 U/L (ref 39–117)
ALT: 18 U/L (ref 0–53)
AST: 24 U/L (ref 0–37)
Albumin: 4.7 g/dL (ref 3.5–5.2)
Anion gap: 10 (ref 5–15)
BILIRUBIN TOTAL: 1.4 mg/dL — AB (ref 0.3–1.2)
BUN: 14 mg/dL (ref 6–23)
CALCIUM: 9.3 mg/dL (ref 8.4–10.5)
CO2: 29 mmol/L (ref 19–32)
CREATININE: 1.47 mg/dL — AB (ref 0.50–1.35)
Chloride: 97 mmol/L (ref 96–112)
GFR, EST AFRICAN AMERICAN: 66 mL/min — AB (ref >90)
GFR, EST NON AFRICAN AMERICAN: 57 mL/min — AB (ref >90)
Glucose, Bld: 123 mg/dL — ABNORMAL HIGH (ref 70–99)
Potassium: 4.6 mmol/L (ref 3.5–5.1)
SODIUM: 136 mmol/L (ref 135–145)
Total Protein: 8.6 g/dL — ABNORMAL HIGH (ref 6.0–8.3)

## 2014-11-01 LAB — INFLUENZA PANEL BY PCR (TYPE A & B)
H1N1 flu by pcr: DETECTED — AB
INFLAPCR: POSITIVE — AB
Influenza B By PCR: NEGATIVE

## 2014-11-01 MED ORDER — KETOROLAC TROMETHAMINE 30 MG/ML IJ SOLN
30.0000 mg | Freq: Once | INTRAMUSCULAR | Status: AC
  Administered 2014-11-01: 30 mg via INTRAVENOUS
  Filled 2014-11-01: qty 1

## 2014-11-01 MED ORDER — MORPHINE SULFATE 4 MG/ML IJ SOLN: 4.0000 mg | Freq: Once | INTRAMUSCULAR | Status: DC

## 2014-11-01 MED ORDER — SODIUM CHLORIDE 0.9 % IV BOLUS (SEPSIS)
2000.0000 mL | Freq: Once | INTRAVENOUS | Status: AC
  Administered 2014-11-01: 2000 mL via INTRAVENOUS

## 2014-11-01 MED ORDER — ONDANSETRON 8 MG PO TBDP: 8.0000 mg | ORAL_TABLET | Freq: Three times a day (TID) | ORAL | Status: DC | PRN

## 2014-11-01 NOTE — ED Provider Notes (Signed)
CSN: [id number]     Arrival date & time 10/31/14  2314 History   First MD Initiated Contact with Patient 11/01/14 0505     Chief Complaint  Patient presents with  . Fever  . Sore Throat  . Migraine      HPI Patient reports fever, headache, sore throat over the past 3 days.  Also reports nausea with myalgias.  Reports mild decreased oral intake.  Reports fever.  History of multiple myeloma.  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  Patient ports productive cough.  He denies shortness of breath.  No urinary complaints.  No back pain or flank pain.  Denies abdominal pain.  Denies joint pain.  Denies neck pain or neck stiffness.   Past Medical History  Diagnosis Date  . History of radiation therapy 02/07/13- 02/26/13    lower L spine, upper sacrum, 35 gray in 14 fractions  . Cancer   . Multiple myeloma dx'd 01/2013  . FH: chemotherapy     Dr. Burney Gauze   Past Surgical History  Procedure Laterality Date  . Portacath placement    . Bone marrow transplant    . Kyphoplasty  05/20/14  . Lumbar laminectomy/decompression microdiscectomy N/A 09/25/2014    Procedure: LUMBAR LAMINECTOMY/DECOMPRESSION MICRODISCECTOMY;  Surgeon: Sinclair Ship, MD;  Location: [place];  Service: Orthopedics;  Laterality: N/A;  Lumbar 4-5, L5-S1  decompression   Family History  Problem Relation Age of Onset  . Diabetic kidney disease Mother   . Hypertension Mother   . Heart attack Mother   . Kidney failure Mother   . Coronary artery disease Mother   . HIV Father    History  Substance Use Topics  . Smoking status: Former Smoker -- 1.00 packs/day for 15 years    Types: Cigarettes    Start date: 06/29/1988    Quit date: 06/29/2003  . Smokeless tobacco: Never Used     Comment: quit smoking 10 years ago  . Alcohol Use: No    Review of Systems  All other systems reviewed and are negative.     Allergies  Review of patient's allergies indicates no  known allergies.  Home Medications   Prior to Admission medications   Medication Sig Start Date End Date Taking? Authorizing Provider  acetaminophen (TYLENOL) 500 MG tablet Take 500 mg by mouth every 6 (six) hours as needed for moderate pain or headache.   Yes Historical Provider, MD  acyclovir (ZOVIRAX) 800 MG tablet Take 1 tablet (800 mg total) by mouth 2 (two) times daily. 06/06/14  Yes [name], MD  amoxicillin-clavulanate (AUGMENTIN) 875-125 MG per tablet Take 1 tablet by mouth 2 (two) times daily. 10/30/14  Yes [name], MD  aspirin EC 325 MG tablet Take 325 mg by mouth daily.   Yes Historical Provider, MD  bisacodyl (DULCOLAX) 5 MG EC tablet Take 5 mg by mouth daily as needed for moderate constipation.   Yes Historical Provider, MD  calcium gluconate 500 MG tablet Take 1 tablet by mouth daily.   Yes Historical Provider, MD  fentaNYL (DURAGESIC - DOSED MCG/HR) 25 MCG/HR patch Place 1 patch (25 mcg total) onto the skin every 3 (three) days. 10/28/14  Yes [name], NP  gabapentin (NEURONTIN) 300 MG capsule Take 300-600 mg by mouth 2 (two) times daily. 362m in the morning and 6038min the evening 06/06/14  Yes Historical Provider, MD  ibuprofen (ADVIL,MOTRIN) 200 MG tablet  Take 400 mg by mouth every 6 (six) hours as needed for headache or moderate pain.   Yes Historical Provider, MD  lenalidomide (REVLIMID) 10 MG capsule Take 1 capsule (10 mg total) by mouth daily. Josem Kaufmann #[id number] 08/28/14  Yes [name], MD  oxyCODONE-acetaminophen (PERCOCET) 10-325 MG per tablet Take 1 tablet by mouth every 4 (four) hours as needed for pain. 08/28/14  Yes [name], MD  prochlorperazine (COMPAZINE) 10 MG tablet Take 10 mg by mouth every 8 (eight) hours as needed for nausea or vomiting.   Yes Historical Provider, MD   BP 96/63 mmHg  Pulse 102  Temp(Src) 100.8 F (38.2 C) (Oral)  Resp 18  SpO2 96% Physical Exam  Constitutional: He is oriented to person, place, and time. He  appears well-developed and well-nourished.  HENT:  Head: Normocephalic and atraumatic.  Posterior pharynx is normal.  No erythema.  No tonsillar exudate.  Uvula is midline.  Tolerating secretions.  Dentition is without acute abnormality.  Eyes: EOM are normal.  Neck: Normal range of motion. Neck supple. No thyromegaly present.  No meningeal signs  Cardiovascular: Normal rate, regular rhythm, normal heart sounds and intact distal pulses.   Pulmonary/Chest: Effort normal and breath sounds normal. No respiratory distress.  Abdominal: Soft. He exhibits no distension. There is no tenderness.  Musculoskeletal: Normal range of motion.  Lymphadenopathy:    He has no cervical adenopathy.  Neurological: He is alert and oriented to person, place, and time.  Skin: Skin is warm and dry.  Psychiatric: He has a normal mood and affect. Judgment normal.  Nursing note and vitals reviewed.   ED Course  Procedures (including critical care time) Labs Review Labs Reviewed  CBC WITH DIFFERENTIAL/PLATELET - Abnormal; Notable for the following:    RBC 3.85 (*)    Hemoglobin 12.3 (*)    HCT 36.8 (*)    Platelets 127 (*)    Lymphocytes Relative 10 (*)    Monocytes Relative 13 (*)    All other components within normal limits  COMPREHENSIVE METABOLIC PANEL - Abnormal; Notable for the following:    Glucose, Bld 123 (*)    Creatinine, Ser 1.47 (*)    Total Protein 8.6 (*)    Total Bilirubin 1.4 (*)    GFR calc non Af Amer 57 (*)    GFR calc Af Amer 66 (*)    All other components within normal limits  URINALYSIS, ROUTINE W REFLEX MICROSCOPIC - Abnormal; Notable for the following:    Protein, ur 30 (*)    All other components within normal limits  URINE MICROSCOPIC-ADD ON - Abnormal; Notable for the following:    Bacteria, UA FEW (*)    All other components within normal limits  RAPID STREP SCREEN  URINE CULTURE  CULTURE, GROUP A STREP  CULTURE, BLOOD (ROUTINE X 2)  CULTURE, BLOOD (ROUTINE X 2)   INFLUENZA PANEL BY PCR (TYPE A & B, H1N1)    Imaging Review Dg Chest 2 View  11/01/2014   CLINICAL DATA:  Fever and cough, history of multiple myeloma  EXAM: CHEST  2 VIEW  COMPARISON:  05/07/2014  FINDINGS: Cardiac shadow is within normal limits. A right-sided chest wall port is again seen and stable. The lungs are well aerated bilaterally. No focal infiltrate or sizable effusion is seen. Changes of prior vertebral augmentation are noted at T12. No acute bony abnormality is noted.  IMPRESSION: No acute abnormality seen.   Electronically Signed   By: Elta Guadeloupe  Lukens M.D.   On: 11/01/2014 07:31  I personally reviewed the imaging tests through PACS system I reviewed available ER/hospitalization records through the EMR    EKG Interpretation None      MDM   Final diagnoses:  Fever, unspecified fever cause  Myalgia  Dehydration    8:01 AM Patient is feeling much better at this time.  Discharge home in good condition.  Likely viral process.    Jola Schmidt, MD 11/01/14 [phone]

## 2014-11-01 NOTE — ED Notes (Addendum)
Pt in car due to back pain. Called x1

## 2014-11-01 NOTE — Discharge Instructions (Signed)
Dehydration, Adult Dehydration is when you lose more fluids from the body than you take in. Vital organs like the kidneys, brain, and heart cannot function without a proper amount of fluids and salt. Any loss of fluids from the body can cause dehydration.  CAUSES   Vomiting.  Diarrhea.  Excessive sweating.  Excessive urine output.  Fever. SYMPTOMS  Mild dehydration  Thirst.  Dry lips.  Slightly dry mouth. Moderate dehydration  Very dry mouth.  Sunken eyes.  Skin does not bounce back quickly when lightly pinched and released.  Dark urine and decreased urine production.  Decreased tear production.  Headache. Severe dehydration  Very dry mouth.  Extreme thirst.  Rapid, weak pulse (more than 100 beats per minute at rest).  Cold hands and feet.  Not able to sweat in spite of heat and temperature.  Rapid breathing.  Blue lips.  Confusion and lethargy.  Difficulty being awakened.  Minimal urine production.  No tears. DIAGNOSIS  Your caregiver will diagnose dehydration based on your symptoms and your exam. Blood and urine tests will help confirm the diagnosis. The diagnostic evaluation should also identify the cause of dehydration. TREATMENT  Treatment of mild or moderate dehydration can often be done at home by increasing the amount of fluids that you drink. It is best to drink small amounts of fluid more often. Drinking too much at one time can make vomiting worse. Refer to the home care instructions below. Severe dehydration needs to be treated at the hospital where you will probably be given intravenous (IV) fluids that contain water and electrolytes. HOME CARE INSTRUCTIONS   Ask your caregiver about specific rehydration instructions.  Drink enough fluids to keep your urine clear or pale yellow.  Drink small amounts frequently if you have nausea and vomiting.  Eat as you normally do.  Avoid:  Foods or drinks high in sugar.  Carbonated  drinks.  Juice.  Extremely hot or cold fluids.  Drinks with caffeine.  Fatty, greasy foods.  Alcohol.  Tobacco.  Overeating.  Gelatin desserts.  Wash your hands well to avoid spreading bacteria and viruses.  Only take over-the-counter or prescription medicines for pain, discomfort, or fever as directed by your caregiver.  Ask your caregiver if you should continue all prescribed and over-the-counter medicines.  Keep all follow-up appointments with your caregiver. SEEK MEDICAL CARE IF:  You have abdominal pain and it increases or stays in one area (localizes).  You have a rash, stiff neck, or severe headache.  You are irritable, sleepy, or difficult to awaken.  You are weak, dizzy, or extremely thirsty. SEEK IMMEDIATE MEDICAL CARE IF:   You are unable to keep fluids down or you get worse despite treatment.  You have frequent episodes of vomiting or diarrhea.  You have blood or green matter (bile) in your vomit.  You have blood in your stool or your stool looks black and tarry.  You have not urinated in 6 to 8 hours, or you have only urinated a small amount of very dark urine.  You have a fever.  You faint. MAKE SURE YOU:   Understand these instructions.  Will watch your condition.  Will get help right away if you are not doing well or get worse. Document Released: 07/18/2005 Document Revised: 10/10/2011 Document Reviewed: 03/07/2011 [facility] Patient Information 2015 [place], [place]. This information is not intended to replace advice given to you by your health care provider. Make sure you discuss any questions you have with your health care  provider.  Fever, Adult A fever is a higher than normal body temperature. In an adult, an oral temperature around 98.6 F (37 C) is considered normal. A temperature of 100.4 F (38 C) or higher is generally considered a fever. Mild or moderate fevers generally have no long-term effects and often do not require treatment.  Extreme fever (greater than or equal to 106 F or 41.1 C) can cause seizures. The sweating that may occur with repeated or prolonged fever may cause dehydration. Elderly people can develop confusion during a fever. A measured temperature can vary with:  Age.  Time of day.  Method of measurement (mouth, underarm, rectal, or ear). The fever is confirmed by taking a temperature with a thermometer. Temperatures can be taken different ways. Some methods are accurate and some are not.  An oral temperature is used most commonly. Electronic thermometers are fast and accurate.  An ear temperature will only be accurate if the thermometer is positioned as recommended by the manufacturer.  A rectal temperature is accurate and done for those adults who have a condition where an oral temperature cannot be taken.  An underarm (axillary) temperature is not accurate and not recommended. Fever is a symptom, not a disease.  CAUSES   Infections commonly cause fever.  Some noninfectious causes for fever include:  Some arthritis conditions.  Some thyroid or adrenal gland conditions.  Some immune system conditions.  Some types of cancer.  A medicine reaction.  High doses of certain street drugs such as methamphetamine.  Dehydration.  Exposure to high outside or room temperatures.  Occasionally, the source of a fever cannot be determined. This is sometimes called a "fever of unknown origin" (FUO).  Some situations may lead to a temporary rise in body temperature that may go away on its own. Examples are:  Childbirth.  Surgery.  Intense exercise. HOME CARE INSTRUCTIONS   Take appropriate medicines for fever. Follow dosing instructions carefully. If you use acetaminophen to reduce the fever, be careful to avoid taking other medicines that also contain acetaminophen. Do not take aspirin for a fever if you are younger than age 69. There is an association with Reye's syndrome. Reye's syndrome  is a rare but potentially deadly disease.  If an infection is present and antibiotics have been prescribed, take them as directed. Finish them even if you start to feel better.  Rest as needed.  Maintain an adequate fluid intake. To prevent dehydration during an illness with prolonged or recurrent fever, you may need to drink extra fluid.Drink enough fluids to keep your urine clear or pale yellow.  Sponging or bathing with room temperature water may help reduce body temperature. Do not use ice water or alcohol sponge baths.  Dress comfortably, but do not over-bundle. SEEK MEDICAL CARE IF:   You are unable to keep fluids down.  You develop vomiting or diarrhea.  You are not feeling at least partly better after 3 days.  You develop new symptoms or problems. SEEK IMMEDIATE MEDICAL CARE IF:   You have shortness of breath or trouble breathing.  You develop excessive weakness.  You are dizzy or you faint.  You are extremely thirsty or you are making little or no urine.  You develop new pain that was not there before (such as in the head, neck, chest, back, or abdomen).  You have persistent vomiting and diarrhea for more than 1 to 2 days.  You develop a stiff neck or your eyes become sensitive to light.  You develop a skin rash.  You have a fever or persistent symptoms for more than 2 to 3 days.  You have a fever and your symptoms suddenly get worse. MAKE SURE YOU:   Understand these instructions.  Will watch your condition.  Will get help right away if you are not doing well or get worse. Document Released: 01/11/2001 Document Revised: 12/02/2013 Document Reviewed: 05/19/2011 [facility] Patient Information 2015 [place], [place]. This information is not intended to replace advice given to you by your health care provider. Make sure you discuss any questions you have with your health care provider.

## 2014-11-01 NOTE — ED Notes (Signed)
Patient transported to X-ray 

## 2014-11-02 ENCOUNTER — Encounter (HOSPITAL_COMMUNITY): Payer: Self-pay

## 2014-11-02 ENCOUNTER — Inpatient Hospital Stay (HOSPITAL_COMMUNITY)
Admission: EM | Admit: 2014-11-02 | Discharge: 2014-11-07 | DRG: 871 | Disposition: A | Discharge status: Home / Self Care | Payer: BLUE CROSS/BLUE SHIELD | Attending: Internal Medicine | Admitting: Internal Medicine

## 2014-11-02 ENCOUNTER — Emergency Department (HOSPITAL_COMMUNITY): Payer: BLUE CROSS/BLUE SHIELD

## 2014-11-02 DIAGNOSIS — J9601 Acute respiratory failure with hypoxia: Secondary | ICD-10-CM | POA: Diagnosis present

## 2014-11-02 DIAGNOSIS — Z7982 Long term (current) use of aspirin: Secondary | ICD-10-CM

## 2014-11-02 DIAGNOSIS — D696 Thrombocytopenia, unspecified: Secondary | ICD-10-CM | POA: Diagnosis present

## 2014-11-02 DIAGNOSIS — N179 Acute kidney failure, unspecified: Secondary | ICD-10-CM | POA: Diagnosis present

## 2014-11-02 DIAGNOSIS — A419 Sepsis, unspecified organism: Secondary | ICD-10-CM | POA: Diagnosis present

## 2014-11-02 DIAGNOSIS — Z87891 Personal history of nicotine dependence: Secondary | ICD-10-CM

## 2014-11-02 DIAGNOSIS — Z5111 Encounter for antineoplastic chemotherapy: Secondary | ICD-10-CM | POA: Diagnosis not present

## 2014-11-02 DIAGNOSIS — J101 Influenza due to other identified influenza virus with other respiratory manifestations: Secondary | ICD-10-CM | POA: Diagnosis not present

## 2014-11-02 DIAGNOSIS — J09X1 Influenza due to identified novel influenza A virus with pneumonia: Secondary | ICD-10-CM | POA: Diagnosis present

## 2014-11-02 DIAGNOSIS — D849 Immunodeficiency, unspecified: Secondary | ICD-10-CM

## 2014-11-02 DIAGNOSIS — J189 Pneumonia, unspecified organism: Secondary | ICD-10-CM | POA: Diagnosis not present

## 2014-11-02 DIAGNOSIS — C9001 Multiple myeloma in remission: Secondary | ICD-10-CM | POA: Diagnosis present

## 2014-11-02 DIAGNOSIS — D638 Anemia in other chronic diseases classified elsewhere: Secondary | ICD-10-CM | POA: Diagnosis present

## 2014-11-02 DIAGNOSIS — R509 Fever, unspecified: Secondary | ICD-10-CM | POA: Diagnosis present

## 2014-11-02 DIAGNOSIS — R112 Nausea with vomiting, unspecified: Secondary | ICD-10-CM | POA: Diagnosis present

## 2014-11-02 DIAGNOSIS — Z9221 Personal history of antineoplastic chemotherapy: Secondary | ICD-10-CM | POA: Diagnosis not present

## 2014-11-02 DIAGNOSIS — E860 Dehydration: Secondary | ICD-10-CM | POA: Diagnosis present

## 2014-11-02 DIAGNOSIS — C9000 Multiple myeloma not having achieved remission: Secondary | ICD-10-CM | POA: Diagnosis present

## 2014-11-02 DIAGNOSIS — J181 Lobar pneumonia, unspecified organism: Secondary | ICD-10-CM | POA: Diagnosis present

## 2014-11-02 DIAGNOSIS — IMO0001 Reserved for inherently not codable concepts without codable children: Secondary | ICD-10-CM | POA: Diagnosis present

## 2014-11-02 DIAGNOSIS — M549 Dorsalgia, unspecified: Secondary | ICD-10-CM | POA: Diagnosis present

## 2014-11-02 DIAGNOSIS — D899 Disorder involving the immune mechanism, unspecified: Secondary | ICD-10-CM | POA: Diagnosis present

## 2014-11-02 LAB — CBC WITH DIFFERENTIAL/PLATELET
BASOS PCT: 0 % (ref 0–1)
Basophils Absolute: 0 10*3/uL (ref 0.0–0.1)
Eosinophils Absolute: 0 10*3/uL (ref 0.0–0.7)
Eosinophils Relative: 0 % (ref 0–5)
HCT: 33.9 % — ABNORMAL LOW (ref 39.0–52.0)
HEMOGLOBIN: 11.2 g/dL — AB (ref 13.0–17.0)
Lymphocytes Relative: 11 % — ABNORMAL LOW (ref 12–46)
Lymphs Abs: 0.6 10*3/uL — ABNORMAL LOW (ref 0.7–4.0)
MCH: 31.7 pg (ref 26.0–34.0)
MCHC: 33 g/dL (ref 30.0–36.0)
MCV: 96 fL (ref 78.0–100.0)
Monocytes Absolute: 0.4 10*3/uL (ref 0.1–1.0)
Monocytes Relative: 7 % (ref 3–12)
Neutro Abs: 4.4 10*3/uL (ref 1.7–7.7)
Neutrophils Relative %: 82 % — ABNORMAL HIGH (ref 43–77)
Platelets: 127 10*3/uL — ABNORMAL LOW (ref 150–400)
RBC: 3.53 MIL/uL — ABNORMAL LOW (ref 4.22–5.81)
RDW: 13.6 % (ref 11.5–15.5)
WBC: 5.3 10*3/uL (ref 4.0–10.5)

## 2014-11-02 LAB — RAPID STREP SCREEN (MED CTR MEBANE ONLY): STREPTOCOCCUS, GROUP A SCREEN (DIRECT): NEGATIVE

## 2014-11-02 LAB — COMPREHENSIVE METABOLIC PANEL
ALBUMIN: 4.3 g/dL (ref 3.5–5.2)
ALT: 19 U/L (ref 0–53)
ANION GAP: 10 (ref 5–15)
AST: 26 U/L (ref 0–37)
Alkaline Phosphatase: 60 U/L (ref 39–117)
BUN: 13 mg/dL (ref 6–23)
CALCIUM: 8.9 mg/dL (ref 8.4–10.5)
CO2: 26 mmol/L (ref 19–32)
Chloride: 101 mmol/L (ref 96–112)
Creatinine, Ser: 1.25 mg/dL (ref 0.50–1.35)
GFR, EST AFRICAN AMERICAN: 81 mL/min — AB (ref >90)
GFR, EST NON AFRICAN AMERICAN: 70 mL/min — AB (ref >90)
Glucose, Bld: 153 mg/dL — ABNORMAL HIGH (ref 70–99)
Potassium: 4.2 mmol/L (ref 3.5–5.1)
Sodium: 137 mmol/L (ref 135–145)
Total Bilirubin: 1.1 mg/dL (ref 0.3–1.2)
Total Protein: 7.8 g/dL (ref 6.0–8.3)

## 2014-11-02 LAB — LIPASE, BLOOD: Lipase: 23 U/L (ref 11–59)

## 2014-11-02 LAB — URINE CULTURE
COLONY COUNT: NO GROWTH
Culture: NO GROWTH

## 2014-11-02 LAB — URINE MICROSCOPIC-ADD ON

## 2014-11-02 LAB — URINALYSIS, ROUTINE W REFLEX MICROSCOPIC
BILIRUBIN URINE: NEGATIVE
Glucose, UA: NEGATIVE mg/dL
Hgb urine dipstick: NEGATIVE
Ketones, ur: NEGATIVE mg/dL
Leukocytes, UA: NEGATIVE
Nitrite: NEGATIVE
PH: 7.5 (ref 5.0–8.0)
Protein, ur: 30 mg/dL — AB
SPECIFIC GRAVITY, URINE: 1.013 (ref 1.005–1.030)
UROBILINOGEN UA: 0.2 mg/dL (ref 0.0–1.0)

## 2014-11-02 LAB — TROPONIN I: Troponin I: <0.03 ng/mL (ref <0.031)

## 2014-11-02 LAB — I-STAT CG4 LACTIC ACID, ED: Lactic Acid, Venous: 1.07 mmol/L (ref 0.5–2.0)

## 2014-11-02 MED ORDER — SODIUM CHLORIDE 0.9 % IJ SOLN
INTRAMUSCULAR | Status: AC
  Filled 2014-11-02: qty 10

## 2014-11-02 MED ORDER — DEXTROSE 5 % IV SOLN
500.0000 mg | Freq: Once | INTRAVENOUS | Status: AC
  Administered 2014-11-02: 500 mg via INTRAVENOUS
  Filled 2014-11-02: qty 500

## 2014-11-02 MED ORDER — ACETAMINOPHEN 325 MG PO TABS
650.0000 mg | ORAL_TABLET | Freq: Once | ORAL | Status: AC
  Administered 2014-11-02: 650 mg via ORAL
  Filled 2014-11-02: qty 2

## 2014-11-02 MED ORDER — SODIUM CHLORIDE 0.9 % IV BOLUS (SEPSIS)
2000.0000 mL | Freq: Once | INTRAVENOUS | Status: AC
  Administered 2014-11-02: 2000 mL via INTRAVENOUS

## 2014-11-02 MED ORDER — SODIUM CHLORIDE 0.9 % IV BOLUS (SEPSIS)
1000.0000 mL | Freq: Once | INTRAVENOUS | Status: AC
  Administered 2014-11-02: 1000 mL via INTRAVENOUS

## 2014-11-02 MED ORDER — DEXTROSE 5 % IV SOLN
1.0000 g | Freq: Once | INTRAVENOUS | Status: AC
  Administered 2014-11-02: 1 g via INTRAVENOUS
  Filled 2014-11-02: qty 10

## 2014-11-02 NOTE — ED Notes (Signed)
Pt presents with c/o chest pain, nausea, and sore throat. Pt reports his symptoms started last week, was seen last week for his back and then yesterday for the same symptoms.

## 2014-11-02 NOTE — H&P (Signed)
[facility] History and Physical  Mark [name] OMB:[id number] DOB: 08-Apr-1971 DOA: 11/02/2014  Referring physician: PCP: No PCP Per Patient  Specialists:   Chief Complaint: Nausea, vomiting fever  HPI: Mark [name] is a 44 y.o.  BM PMHx multiple myeloma S/P chemotherapy in remission until recently ; started back on chemotherapy on Friday, 10/31/2014, presenting to the ED with sore throat, headache, chest pain, nausea, generalized bodyaches has been ongoing for the past 5 days. Patient reported that his back some temperature today was 104.73F-reported that he's been using ibuprofen with minimal relief of fever control. Stated that he's been having nausea with episodes of emesis-one episode of emesis today-NB/NB. Reported that he's been able to drink fluids, but only small amounts. Stated that he's been having generalized bodyaches all over localized to the lower back-stated that he has history of back pain secondary to laminectomy performed in February 2016. Stated that he's been experiencing chest pain localize the Center of his chest described as a squeezing sensation that comes and goes worse with coughing, stated he's been coughing up yellow/brownish phlegm. Stated that he has been taking Augmentin with minimal relief, was started on Augmentin by Dr. Marin Olp (oncologist) 4 days ago. Wife reported that mother-in-law has a viral syndrome, bronchitis. Patient reported that he's just been feeling weak and extremely run down. Denied difficulty swallowing, neck pain, neck stiffness, blurred vision, sudden loss of vision, hematochezia, melena, moderate assist, urinary symptoms, hematuria, loss of sensation, numbness, tingling, travels, fainting. PCP none (Oncologist) Dr. Marin Olp   Review of Systems: The patient denies anorexia, fever, weight loss,, vision loss, decreased hearing, hoarseness, chest pain, syncope, dyspnea on exertion, peripheral edema, balance deficits, hemoptysis, abdominal pain, melena,  hematochezia, severe indigestion/heartburn, hematuria, incontinence, genital sores, muscle weakness, suspicious skin lesions, transient blindness, difficulty walking, depression, unusual weight change, abnormal bleeding, enlarged lymph nodes, angioedema, and breast masses.    TRAVEL HISTORY:    Consultants:    Procedure/Significant Events:  4/3 CXR;Developing patchy nodular perihilar infiltration in the lungs probably indicating developing inflammatory or infectious process.    Culture     Antibiotics:  Azithromycin 4/4>> Ceftriaxone 4/4>>   DVT prophylaxis:    Devices     LINES / TUBES:     Past Medical History  Diagnosis Date  . History of radiation therapy 02/07/13- 02/26/13    lower L spine, upper sacrum, 35 gray in 14 fractions  . Cancer   . Multiple myeloma dx'd 01/2013  . FH: chemotherapy     Dr. Burney Gauze   Past Surgical History  Procedure Laterality Date  . Portacath placement    . Bone marrow transplant    . Kyphoplasty  05/20/14  . Lumbar laminectomy/decompression microdiscectomy N/A 09/25/2014    Procedure: LUMBAR LAMINECTOMY/DECOMPRESSION MICRODISCECTOMY;  Surgeon: Sinclair Ship, MD;  Location: [place];  Service: Orthopedics;  Laterality: N/A;  Lumbar 4-5, L5-S1  decompression   Social History:  reports that he quit smoking about 11 years ago. His smoking use included Cigarettes. He started smoking about 26 years ago. He has a 15 pack-year smoking history. He has never used smokeless tobacco. He reports that he does not drink alcohol or use illicit drugs.    No Known Allergies  Family History  Problem Relation Age of Onset  . Diabetic kidney disease Mother   . Hypertension Mother   . Heart attack Mother   . Kidney failure Mother   . Coronary artery disease Mother   . HIV Father  Prior to Admission medications   Medication Sig Start Date End Date Taking? Authorizing Provider  acetaminophen (TYLENOL) 500 MG tablet Take 500 mg  by mouth every 6 (six) hours as needed for moderate pain or headache.   Yes Historical Provider, MD  acyclovir (ZOVIRAX) 800 MG tablet Take 1 tablet (800 mg total) by mouth 2 (two) times daily. 06/06/14  Yes [name], MD  amoxicillin-clavulanate (AUGMENTIN) 875-125 MG per tablet Take 1 tablet by mouth 2 (two) times daily. 10/30/14  Yes [name], MD  aspirin EC 325 MG tablet Take 325 mg by mouth daily.   Yes Historical Provider, MD  bisacodyl (DULCOLAX) 5 MG EC tablet Take 5 mg by mouth daily as needed for moderate constipation.   Yes Historical Provider, MD  calcium gluconate 500 MG tablet Take 1 tablet by mouth daily.   Yes Historical Provider, MD  fentaNYL (DURAGESIC - DOSED MCG/HR) 25 MCG/HR patch Place 1 patch (25 mcg total) onto the skin every 3 (three) days. 10/28/14  Yes [name], NP  gabapentin (NEURONTIN) 300 MG capsule Take 300-600 mg by mouth 2 (two) times daily. 357m in the morning and 6074min the evening 06/06/14  Yes Historical Provider, MD  ibuprofen (ADVIL,MOTRIN) 200 MG tablet Take 400 mg by mouth every 6 (six) hours as needed for headache or moderate pain.   Yes Historical Provider, MD  lenalidomide (REVLIMID) 10 MG capsule Take 1 capsule (10 mg total) by mouth daily. AuJosem Kaufmann4#[id number]/28/16  Yes PeVolanda NapoleonMD  oxyCODONE-acetaminophen (PERCOCET) 10-325 MG per tablet Take 1 tablet by mouth every 4 (four) hours as needed for pain. 08/28/14  Yes PeVolanda NapoleonMD  prochlorperazine (COMPAZINE) 10 MG tablet Take 10 mg by mouth every 8 (eight) hours as needed for nausea or vomiting.   Yes Historical Provider, MD  ondansetron (ZOFRAN ODT) 8 MG disintegrating tablet Take 1 tablet (8 mg total) by mouth every 8 (eight) hours as needed for nausea or vomiting. Patient not taking: Reported on 11/02/2014 11/01/14   KeJola SchmidtMD   Physical Exam: Filed Vitals:   11/03/14 0000 11/03/14 0140 11/03/14 0200 11/03/14 0400  BP: 127/71  144/85 136/86  Pulse: 113  109 102   Temp: 102.1 F (38.9 C)  101.8 F (38.8 C) 101 F (38.3 C)  TempSrc:   Oral Oral  Resp: 38  33 30  Height:      Weight:      SpO2: 94% 94% 96% 92%     General:  A/O 4, mild to moderate distress, secondary to N/V, diaphoresis  Eyes: Pupils equal round reactive to light and accommodation  Neck: Negative JVD negative topically  Cardiovascular: Tachycardic, regular rhythm, negative murmurs rubs or gallops, normal S1/S2  Respiratory: Clear to auscultation bilateral  Abdomen: Soft, nontender, nondistended, plus bowel sound  Musculoskeletal: Negative pedal edema  Labs on Admission:  Basic Metabolic Panel:  Recent Labs Lab 10/28/14 0932 11/01/14 0020 11/02/14 2138 11/03/14 0430  NA 144 136 137 139  K 4.1 4.6 4.2 4.3  CL 106 97 101 107  CO2 '30 29 26 25  ' GLUCOSE 95 123* 153* 134*  BUN '17 14 13 9  ' CREATININE 1.3* 1.47* 1.25 1.14  CALCIUM 9.8 9.3 8.9 8.2*  MG  --   --   --  2.0   Liver Function Tests:  Recent Labs Lab 10/28/14 0932 11/01/14 0020 11/02/14 2138 11/03/14 0430  AST '19 24 26 21  ' ALT 15 18 19  18  ALKPHOS 61 61 60 54  BILITOT 1.50 1.4* 1.1 0.7  PROT 7.5 8.6* 7.8 7.5  ALBUMIN  --  4.7 4.3 3.7    Recent Labs Lab 11/02/14 2138  LIPASE 23   No results for input(s): AMMONIA in the last 168 hours. CBC:  Recent Labs Lab 10/28/14 0932 11/01/14 0020 11/02/14 2138 11/03/14 0430  WBC 3.4* 6.7 5.3 5.4  NEUTROABS 1.5 5.1 4.4 4.8  HGB 11.0* 12.3* 11.2* 9.9*  HCT 31.9* 36.8* 33.9* 29.7*  MCV 95 95.6 96.0 95.8  PLT 164 127* 127* 115*   Cardiac Enzymes:  Recent Labs Lab 11/02/14 2138  TROPONINI <0.03    BNP (last 3 results) No results for input(s): BNP in the last 8760 hours.  ProBNP (last 3 results) No results for input(s): PROBNP in the last 8760 hours.  CBG: No results for input(s): GLUCAP in the last 168 hours.  Radiological Exams on Admission: Dg Chest 2 View  11/02/2014   CLINICAL DATA:  Left lower chest pain, sore throat,  nausea, headache, body aches, and fever since Wednesday.  EXAM: CHEST  2 VIEW  COMPARISON:  11/01/2014  FINDINGS: Power port type central venous catheter is unchanged in position. There is evidence of developing patchy nodular infiltration in the perihilar region of both lungs, greater on the right. This suggest possible developing inflammatory process, given clinical history. Old bilateral rib fractures are present. No blunting of costophrenic angles. No pneumothorax. Heart size and pulmonary vascularity are normal. Degenerative changes in the left shoulder.  IMPRESSION: Developing patchy nodular perihilar infiltration in the lungs probably indicating developing inflammatory or infectious process.   Electronically Signed   By: Lucienne Capers M.D.   On: 11/02/2014 22:36   Dg Chest 2 View  11/01/2014   CLINICAL DATA:  Fever and cough, history of multiple myeloma  EXAM: CHEST  2 VIEW  COMPARISON:  05/07/2014  FINDINGS: Cardiac shadow is within normal limits. A right-sided chest wall port is again seen and stable. The lungs are well aerated bilaterally. No focal infiltrate or sizable effusion is seen. Changes of prior vertebral augmentation are noted at T12. No acute bony abnormality is noted.  IMPRESSION: No acute abnormality seen.   Electronically Signed   By: Inez Catalina M.D.   On: 11/01/2014 07:31    EKG: Sinus tachycardia   Assessment/Plan Principal Problem:   Influenza A (H1N1) Active Problems:   Nausea with vomiting   Multiple myeloma   Dehydration   Back pain   Sepsis   CAP (community acquired pneumonia)   Influenza A   Blood poisoning   Immunocompromised  Sepsis/Influenza a (H1 N1) -Positive for influenza A, H1 N1 48 hours ago not started on Tamiflu; start Tamiflu 75 mg BID -Continue normal saline 200 ml/hr -Monitor closely for fluid overload -Echocardiogram pending  CAP -See influenza -Continue empiric antibiotics given patient's immunocompromised state -Solu-Medrol 60 mg  daily -DuoNebTID -Flutter valve -Mucinex DM  Multiple myeloma -Continue home acyclovir regimen 800 mg BID -Continue Linalidomide 10 mg daily -Contact(Oncologist) Dr. Marin Olp in the a.m. and informed him patient has been hospitalized  Dehydration -See sepsis  Code Status: Full Family Communication: None Disposition Plan: Resolution sepsis  Time spent: 60 minutes  [place], [facility] Pager [phone]  If 7PM-7AM, please contact night-coverage [email or web address] Password [email or web address] 11/03/2014, 5:52 AM

## 2014-11-02 NOTE — ED Notes (Signed)
Pt has been told a urine sample is needed from him, urinal at the bedside

## 2014-11-02 NOTE — ED Provider Notes (Signed)
CSN: [id number]     Arrival date & time 11/02/14  2029 History   First MD Initiated Contact with Patient 11/02/14 2059     Chief Complaint  Patient presents with  . Chest Pain  . Sore Throat  . Nausea     (Consider location/radiation/quality/duration/timing/severity/associated sxs/prior Treatment) The history is provided by the patient. No language interpreter was used.  Mark [name] is a 44 y/o M with PMHx of multiple myeloma recently started back on chemotherapy on Friday, 10/31/2014, presenting to the ED with sore throat, headache, chest pain, nausea, generalized bodyaches has been ongoing for the past 5 days. Patient reported that his back some temperature today was 104.63F-reported that he's been using ibuprofen with minimal relief of fever control. Stated that he's been having nausea with episodes of emesis-one episode of emesis today-NB/NB. Reported that he's been able to drink fluids, but only small amounts. Stated that he's been having generalized bodyaches all over localized to the lower back-stated that he has history of back pain secondary to laminectomy performed in February 2016. Stated that he's been experiencing chest pain localize the Center of his chest described as a squeezing sensation that comes and goes worse with coughing, stated he's been coughing up yellow/brownish phlegm. Stated that he has been taking Augmentin with minimal relief, was started on Augmentin by Dr. Marin Olp 4 days ago. Wife reported that mother-in-law has a viral syndrome, bronchitis. Patient reported that he's just been feeling weak and extremely run down. Denied difficulty swallowing, neck pain, neck stiffness, blurred vision, sudden loss of vision, hematochezia, melena, moderate assist, urinary symptoms, hematuria, loss of sensation, numbness, tingling, travels, fainting. PCP none Oncologist Dr. Marin Olp  Past Medical History  Diagnosis Date  . History of radiation therapy 02/07/13- 02/26/13    lower L  spine, upper sacrum, 35 gray in 14 fractions  . Cancer   . Multiple myeloma dx'd 01/2013  . FH: chemotherapy     Dr. Burney Gauze   Past Surgical History  Procedure Laterality Date  . Portacath placement    . Bone marrow transplant    . Kyphoplasty  05/20/14  . Lumbar laminectomy/decompression microdiscectomy N/A 09/25/2014    Procedure: LUMBAR LAMINECTOMY/DECOMPRESSION MICRODISCECTOMY;  Surgeon: Sinclair Ship, MD;  Location: [facility];  Service: Orthopedics;  Laterality: N/A;  Lumbar 4-5, L5-S1  decompression   Family History  Problem Relation Age of Onset  . Diabetic kidney disease Mother   . Hypertension Mother   . Heart attack Mother   . Kidney failure Mother   . Coronary artery disease Mother   . HIV Father    History  Substance Use Topics  . Smoking status: Former Smoker -- 1.00 packs/day for 15 years    Types: Cigarettes    Start date: 06/29/1988    Quit date: 06/29/2003  . Smokeless tobacco: Never Used     Comment: quit smoking 10 years ago  . Alcohol Use: No    Review of Systems  Constitutional: Positive for fever and chills.  HENT: Positive for congestion and sore throat. Negative for trouble swallowing.   Eyes: Negative for visual disturbance.  Respiratory: Positive for cough and shortness of breath. Negative for chest tightness.   Cardiovascular: Positive for chest pain.  Gastrointestinal: Positive for nausea and vomiting. Negative for abdominal pain.  Genitourinary: Negative for dysuria and hematuria.  Musculoskeletal: Positive for myalgias (generalized). Negative for back pain, neck pain and neck stiffness.  Neurological: Positive for headaches. Negative for dizziness, weakness and numbness.  Allergies  Review of patient's allergies indicates no known allergies.  Home Medications   Prior to Admission medications   Medication Sig Start Date End Date Taking? Authorizing Provider  acetaminophen (TYLENOL) 500 MG tablet Take 500 mg by mouth every  6 (six) hours as needed for moderate pain or headache.   Yes Historical Provider, MD  acyclovir (ZOVIRAX) 800 MG tablet Take 1 tablet (800 mg total) by mouth 2 (two) times daily. 06/06/14  Yes [name], MD  amoxicillin-clavulanate (AUGMENTIN) 875-125 MG per tablet Take 1 tablet by mouth 2 (two) times daily. 10/30/14  Yes [name], MD  aspirin EC 325 MG tablet Take 325 mg by mouth daily.   Yes Historical Provider, MD  bisacodyl (DULCOLAX) 5 MG EC tablet Take 5 mg by mouth daily as needed for moderate constipation.   Yes Historical Provider, MD  calcium gluconate 500 MG tablet Take 1 tablet by mouth daily.   Yes Historical Provider, MD  fentaNYL (DURAGESIC - DOSED MCG/HR) 25 MCG/HR patch Place 1 patch (25 mcg total) onto the skin every 3 (three) days. 10/28/14  Yes [name], NP  gabapentin (NEURONTIN) 300 MG capsule Take 300-600 mg by mouth 2 (two) times daily. 371m in the morning and 6076min the evening 06/06/14  Yes Historical Provider, MD  ibuprofen (ADVIL,MOTRIN) 200 MG tablet Take 400 mg by mouth every 6 (six) hours as needed for headache or moderate pain.   Yes Historical Provider, MD  lenalidomide (REVLIMID) 10 MG capsule Take 1 capsule (10 mg total) by mouth daily. AuJosem Kaufmann4#[id number]/28/16  Yes PeVolanda NapoleonMD  oxyCODONE-acetaminophen (PERCOCET) 10-325 MG per tablet Take 1 tablet by mouth every 4 (four) hours as needed for pain. 08/28/14  Yes PeVolanda NapoleonMD  prochlorperazine (COMPAZINE) 10 MG tablet Take 10 mg by mouth every 8 (eight) hours as needed for nausea or vomiting.   Yes Historical Provider, MD  ondansetron (ZOFRAN ODT) 8 MG disintegrating tablet Take 1 tablet (8 mg total) by mouth every 8 (eight) hours as needed for nausea or vomiting. Patient not taking: Reported on 11/02/2014 11/01/14   KeJola SchmidtMD   BP 100/77 mmHg  Pulse 118  Temp(Src) 102.8 F (39.3 C) (Rectal)  Resp 20  SpO2 99% Physical Exam  Constitutional: He is oriented to person, place, and  time. He appears well-developed and well-nourished. No distress.  HENT:  Head: Normocephalic and atraumatic.  Mouth/Throat: Oropharynx is clear and moist. No oropharyngeal exudate.  Eyes: Conjunctivae and EOM are normal. Pupils are equal, round, and reactive to light. Right eye exhibits no discharge. Left eye exhibits no discharge.  Neck: Normal range of motion. Neck supple. No tracheal deviation present.  Negative neck stiffness Negative nuchal rigidity  Negative cervical lymphadenopathy  Negative meningeal signs   Cardiovascular: Regular rhythm and normal heart sounds.  Tachycardia present.  Exam reveals no friction rub.   No murmur heard. Pulses:      Radial pulses are 2+ on the right side, and 2+ on the left side.  Pulmonary/Chest: Effort normal and breath sounds normal. Tachypnea noted. No respiratory distress. He has no wheezes. He has no rales. He exhibits no tenderness.  Patient is able to speak in full sentences without difficulty  Negative use of accessory muscles Negative stridor  Abdominal: Soft. Bowel sounds are normal. He exhibits no distension. There is no tenderness. There is no rebound and no guarding.  Musculoskeletal: Normal range of motion.  Full ROM to upper  and lower extremities without difficulty noted, negative ataxia noted.  Lymphadenopathy:    He has no cervical adenopathy.  Neurological: He is alert and oriented to person, place, and time. No cranial nerve deficit. He exhibits normal muscle tone. Coordination normal. GCS eye subscore is 4. GCS verbal subscore is 5. GCS motor subscore is 6.  Cranial nerves III-XII grossly intact Strength 5+/5+ to upper and lower extremities bilaterally with resistance applied, equal distribution noted Equal grip strength bilaterally  Negative facial droop Negative slurred speech  Negative aphasia Negative arm drift Patient follows commands well  Patient responds to questions appropriately  Skin: Skin is warm and dry. No rash  noted. He is not diaphoretic. No erythema.  Psychiatric: He has a normal mood and affect. His behavior is normal. Thought content normal.  Nursing note and vitals reviewed.   ED Course  Procedures (including critical care time)  Results for orders placed or performed during the hospital encounter of 11/02/14  Rapid strep screen  Result Value Ref Range   Streptococcus, Group A Screen (Direct) NEGATIVE NEGATIVE  CBC with Differential/Platelet  Result Value Ref Range   WBC 5.3 4.0 - 10.5 K/uL   RBC 3.53 (L) 4.22 - 5.81 MIL/uL   Hemoglobin 11.2 (L) 13.0 - 17.0 g/dL   HCT 33.9 (L) 39.0 - 52.0 %   MCV 96.0 78.0 - 100.0 fL   MCH 31.7 26.0 - 34.0 pg   MCHC 33.0 30.0 - 36.0 g/dL   RDW 13.6 11.5 - 15.5 %   Platelets 127 (L) 150 - 400 K/uL   Neutrophils Relative % 82 (H) 43 - 77 %   Neutro Abs 4.4 1.7 - 7.7 K/uL   Lymphocytes Relative 11 (L) 12 - 46 %   Lymphs Abs 0.6 (L) 0.7 - 4.0 K/uL   Monocytes Relative 7 3 - 12 %   Monocytes Absolute 0.4 0.1 - 1.0 K/uL   Eosinophils Relative 0 0 - 5 %   Eosinophils Absolute 0.0 0.0 - 0.7 K/uL   Basophils Relative 0 0 - 1 %   Basophils Absolute 0.0 0.0 - 0.1 K/uL  Comprehensive metabolic panel  Result Value Ref Range   Sodium 137 135 - 145 mmol/L   Potassium 4.2 3.5 - 5.1 mmol/L   Chloride 101 96 - 112 mmol/L   CO2 26 19 - 32 mmol/L   Glucose, Bld 153 (H) 70 - 99 mg/dL   BUN 13 6 - 23 mg/dL   Creatinine, Ser 1.25 0.50 - 1.35 mg/dL   Calcium 8.9 8.4 - 10.5 mg/dL   Total Protein 7.8 6.0 - 8.3 g/dL   Albumin 4.3 3.5 - 5.2 g/dL   AST 26 0 - 37 U/L   ALT 19 0 - 53 U/L   Alkaline Phosphatase 60 39 - 117 U/L   Total Bilirubin 1.1 0.3 - 1.2 mg/dL   GFR calc non Af Amer 70 (L) >90 mL/min   GFR calc Af Amer 81 (L) >90 mL/min   Anion gap 10 5 - 15  Troponin I  Result Value Ref Range   Troponin I <0.03 <0.031 ng/mL  Lipase, blood  Result Value Ref Range   Lipase 23 11 - 59 U/L  I-Stat CG4 Lactic Acid, ED (not at [facility])  Result Value Ref Range    Lactic Acid, Venous 1.07 0.5 - 2.0 mmol/L    Labs Review Labs Reviewed  CBC WITH DIFFERENTIAL/PLATELET - Abnormal; Notable for the following:    RBC 3.53 (*)  Hemoglobin 11.2 (*)    HCT 33.9 (*)    Platelets 127 (*)    Neutrophils Relative % 82 (*)    Lymphocytes Relative 11 (*)    Lymphs Abs 0.6 (*)    All other components within normal limits  COMPREHENSIVE METABOLIC PANEL - Abnormal; Notable for the following:    Glucose, Bld 153 (*)    GFR calc non Af Amer 70 (*)    GFR calc Af Amer 81 (*)    All other components within normal limits  RAPID STREP SCREEN  URINE CULTURE  CULTURE, BLOOD (ROUTINE X 2)  CULTURE, BLOOD (ROUTINE X 2)  CULTURE, GROUP A STREP  TROPONIN I  LIPASE, BLOOD  URINALYSIS, ROUTINE W REFLEX MICROSCOPIC  I-STAT CG4 LACTIC ACID, ED    Imaging Review Dg Chest 2 View  11/02/2014   CLINICAL DATA:  Left lower chest pain, sore throat, nausea, headache, body aches, and fever since Wednesday.  EXAM: CHEST  2 VIEW  COMPARISON:  11/01/2014  FINDINGS: Power port type central venous catheter is unchanged in position. There is evidence of developing patchy nodular infiltration in the perihilar region of both lungs, greater on the right. This suggest possible developing inflammatory process, given clinical history. Old bilateral rib fractures are present. No blunting of costophrenic angles. No pneumothorax. Heart size and pulmonary vascularity are normal. Degenerative changes in the left shoulder.  IMPRESSION: Developing patchy nodular perihilar infiltration in the lungs probably indicating developing inflammatory or infectious process.   Electronically Signed   By: Lucienne Capers M.D.   On: 11/02/2014 22:36   Dg Chest 2 View  11/01/2014   CLINICAL DATA:  Fever and cough, history of multiple myeloma  EXAM: CHEST  2 VIEW  COMPARISON:  05/07/2014  FINDINGS: Cardiac shadow is within normal limits. A right-sided chest wall port is again seen and stable. The lungs are well  aerated bilaterally. No focal infiltrate or sizable effusion is seen. Changes of prior vertebral augmentation are noted at T12. No acute bony abnormality is noted.  IMPRESSION: No acute abnormality seen.   Electronically Signed   By: Inez Catalina M.D.   On: 11/01/2014 07:31     EKG Interpretation   Date/Time:  _0 :26 PM This provider spoke with Dr. Sherral Hammers. Discussed case, labs, imaging, ED course in great detail. Patient to be admitted to stepdown regarding committee acquire pneumonia.  MDM   Final diagnoses:  CAP (community acquired pneumonia)  Immunocompromised    Medications  sodium chloride 0.9 % injection (not administered)  cefTRIAXone (ROCEPHIN) 1 g in dextrose 5 % 50 mL IVPB (1 g Intravenous New Bag/Given 11/02/14 2308)  azithromycin (ZITHROMAX) 500 mg in dextrose 5 % 250 mL IVPB (not administered)  sodium chloride 0.9 % bolus 1,000 mL (1,000 mLs Intravenous New Bag/Given 11/02/14 2211)  acetaminophen (TYLENOL) tablet 650 mg (650 mg Oral Given 11/02/14 2246)  sodium chloride 0.9 % bolus 2,000 mL (2,000 mLs Intravenous New Bag/Given 11/02/14 2249)    Filed Vitals:   11/02/14 2034 11/02/14 2144  BP: 100/77   Pulse: 118   Temp: 99.5 F (37.5 C) 102.8 F (39.3  C)  TempSrc: Oral Rectal  Resp: 20   SpO2: 99%    This provider reviewed patient's chart. Patient was seen and assessed in ED setting on 10/31/2014 regarding generalized myalgias, fever, dehydration. Full workup was performed with negative findings of UTI, pneumonia. Influenza panel was performed secondary positive for type a influenza. Patient was discharged home. EKG noted sinus tachycardia with  a heart rate 114 bpm. Troponin negative elevation. CBC negative elevated leukocytosis. Hemoglobin 11.2, hematocrit 33.9. CMP noted glucose of 153 with negative elevated anion gap. Lipase negative elevation. Lactic acid negative elevation. Rapid strep test negative. Chest x-ray noted developing patchy nodular parahilar infiltration in the lungs probably indicating developing inflammatory or infectious processes.  Rectal temperature 102.33F. Patient appears to be tachycardic with a heart 122 bpm, episodes of tachypnea up to 33/m. Pulse ox 99% on room air. Patient given Tylenol by mouth. Patient given IV fluids, 2 L. Negative findings of neutropenia, negative elevated leukocytosis. Patient currently on chemotherapy, just started on 10/31/2014. Patient started on IV antibiotics in ED setting. Blood cultures 2 and urine culture pending. Discussed case in great detail with admitting physician, Dr. Chesley Noon hospitalist. Patient to be admitted to stepdown unit regarding committee acquired pneumonia and patient recently being renal compromise secondary to starting chemotherapy on 10/31/2014. Discussed plan for admission with patient and wife in great detail who are in accordance to plan. Patient stable for transfer to floor.  Jamse Mead, PA-C 11/02/14 2957  Elnora Morrison, MD 11/03/14 [phone]

## 2014-11-02 NOTE — ED Notes (Signed)
Patient transported to X-ray 

## 2014-11-03 DIAGNOSIS — J181 Lobar pneumonia, unspecified organism: Secondary | ICD-10-CM

## 2014-11-03 DIAGNOSIS — Z5111 Encounter for antineoplastic chemotherapy: Secondary | ICD-10-CM

## 2014-11-03 DIAGNOSIS — C9000 Multiple myeloma not having achieved remission: Secondary | ICD-10-CM

## 2014-11-03 DIAGNOSIS — J101 Influenza due to other identified influenza virus with other respiratory manifestations: Secondary | ICD-10-CM | POA: Diagnosis present

## 2014-11-03 DIAGNOSIS — C9001 Multiple myeloma in remission: Secondary | ICD-10-CM

## 2014-11-03 DIAGNOSIS — D899 Disorder involving the immune mechanism, unspecified: Secondary | ICD-10-CM

## 2014-11-03 DIAGNOSIS — J189 Pneumonia, unspecified organism: Secondary | ICD-10-CM | POA: Insufficient documentation

## 2014-11-03 DIAGNOSIS — D849 Immunodeficiency, unspecified: Secondary | ICD-10-CM | POA: Diagnosis present

## 2014-11-03 DIAGNOSIS — IMO0001 Reserved for inherently not codable concepts without codable children: Secondary | ICD-10-CM | POA: Diagnosis present

## 2014-11-03 DIAGNOSIS — A419 Sepsis, unspecified organism: Principal | ICD-10-CM

## 2014-11-03 LAB — HIV ANTIBODY (ROUTINE TESTING W REFLEX): HIV Screen 4th Generation wRfx: NONREACTIVE

## 2014-11-03 LAB — CULTURE, GROUP A STREP: STREP A CULTURE: NEGATIVE

## 2014-11-03 LAB — MAGNESIUM: MAGNESIUM: 2 mg/dL (ref 1.5–2.5)

## 2014-11-03 LAB — CBC WITH DIFFERENTIAL/PLATELET
Basophils Absolute: 0 10*3/uL (ref 0.0–0.1)
Basophils Relative: 0 % (ref 0–1)
Eosinophils Absolute: 0 10*3/uL (ref 0.0–0.7)
Eosinophils Relative: 0 % (ref 0–5)
HEMATOCRIT: 29.7 % — AB (ref 39.0–52.0)
HEMOGLOBIN: 9.9 g/dL — AB (ref 13.0–17.0)
LYMPHS PCT: 8 % — AB (ref 12–46)
Lymphs Abs: 0.5 10*3/uL — ABNORMAL LOW (ref 0.7–4.0)
MCH: 31.9 pg (ref 26.0–34.0)
MCHC: 33.3 g/dL (ref 30.0–36.0)
MCV: 95.8 fL (ref 78.0–100.0)
Monocytes Absolute: 0.1 10*3/uL (ref 0.1–1.0)
Monocytes Relative: 3 % (ref 3–12)
NEUTROS PCT: 89 % — AB (ref 43–77)
Neutro Abs: 4.8 10*3/uL (ref 1.7–7.7)
Platelets: 115 10*3/uL — ABNORMAL LOW (ref 150–400)
RBC: 3.1 MIL/uL — AB (ref 4.22–5.81)
RDW: 13.7 % (ref 11.5–15.5)
WBC: 5.4 10*3/uL (ref 4.0–10.5)

## 2014-11-03 LAB — COMPREHENSIVE METABOLIC PANEL
ALT: 18 U/L (ref 0–53)
AST: 21 U/L (ref 0–37)
Albumin: 3.7 g/dL (ref 3.5–5.2)
Alkaline Phosphatase: 54 U/L (ref 39–117)
Anion gap: 7 (ref 5–15)
BUN: 9 mg/dL (ref 6–23)
CALCIUM: 8.2 mg/dL — AB (ref 8.4–10.5)
CO2: 25 mmol/L (ref 19–32)
Chloride: 107 mmol/L (ref 96–112)
Creatinine, Ser: 1.14 mg/dL (ref 0.50–1.35)
GFR calc non Af Amer: 78 mL/min — ABNORMAL LOW (ref >90)
Glucose, Bld: 134 mg/dL — ABNORMAL HIGH (ref 70–99)
Potassium: 4.3 mmol/L (ref 3.5–5.1)
SODIUM: 139 mmol/L (ref 135–145)
TOTAL PROTEIN: 7.5 g/dL (ref 6.0–8.3)
Total Bilirubin: 0.7 mg/dL (ref 0.3–1.2)

## 2014-11-03 LAB — STREP PNEUMONIAE URINARY ANTIGEN: Strep Pneumo Urinary Antigen: NEGATIVE

## 2014-11-03 LAB — LACTIC ACID, PLASMA
LACTIC ACID, VENOUS: 0.6 mmol/L (ref 0.5–2.0)
Lactic Acid, Venous: 0.7 mmol/L (ref 0.5–2.0)

## 2014-11-03 LAB — EXPECTORATED SPUTUM ASSESSMENT W REFEX TO RESP CULTURE

## 2014-11-03 LAB — MRSA PCR SCREENING: MRSA by PCR: NEGATIVE

## 2014-11-03 LAB — EXPECTORATED SPUTUM ASSESSMENT W GRAM STAIN, RFLX TO RESP C

## 2014-11-03 MED ORDER — ENOXAPARIN SODIUM 40 MG/0.4ML ~~LOC~~ SOLN
40.0000 mg | Freq: Every day | SUBCUTANEOUS | Status: DC
  Administered 2014-11-03 – 2014-11-06 (×5): 40 mg via SUBCUTANEOUS
  Filled 2014-11-03 (×6): qty 0.4

## 2014-11-03 MED ORDER — CHLORHEXIDINE GLUCONATE 0.12 % MT SOLN
15.0000 mL | Freq: Two times a day (BID) | OROMUCOSAL | Status: DC
  Administered 2014-11-03 – 2014-11-07 (×5): 15 mL via OROMUCOSAL
  Filled 2014-11-03 (×11): qty 15

## 2014-11-03 MED ORDER — GABAPENTIN 300 MG PO CAPS
600.0000 mg | ORAL_CAPSULE | Freq: Two times a day (BID) | ORAL | Status: DC
  Administered 2014-11-03 – 2014-11-05 (×2): 600 mg via ORAL
  Filled 2014-11-03 (×11): qty 2

## 2014-11-03 MED ORDER — FENTANYL 25 MCG/HR TD PT72
25.0000 ug | MEDICATED_PATCH | TRANSDERMAL | Status: DC
  Administered 2014-11-03 – 2014-11-06 (×2): 25 ug via TRANSDERMAL
  Filled 2014-11-03 (×2): qty 1

## 2014-11-03 MED ORDER — CETYLPYRIDINIUM CHLORIDE 0.05 % MT LIQD
7.0000 mL | Freq: Two times a day (BID) | OROMUCOSAL | Status: DC
  Administered 2014-11-03 – 2014-11-07 (×2): 7 mL via OROMUCOSAL

## 2014-11-03 MED ORDER — DM-GUAIFENESIN ER 30-600 MG PO TB12
1.0000 | ORAL_TABLET | Freq: Two times a day (BID) | ORAL | Status: DC
  Administered 2014-11-03 – 2014-11-07 (×10): 1 via ORAL
  Filled 2014-11-03 (×11): qty 1

## 2014-11-03 MED ORDER — OSELTAMIVIR PHOSPHATE 75 MG PO CAPS
75.0000 mg | ORAL_CAPSULE | Freq: Two times a day (BID) | ORAL | Status: AC
  Administered 2014-11-03 – 2014-11-07 (×10): 75 mg via ORAL
  Filled 2014-11-03 (×11): qty 1

## 2014-11-03 MED ORDER — DEXTROSE 5 % IV SOLN
1.0000 g | INTRAVENOUS | Status: DC
  Administered 2014-11-03 – 2014-11-05 (×3): 1 g via INTRAVENOUS
  Filled 2014-11-03 (×3): qty 10

## 2014-11-03 MED ORDER — LENALIDOMIDE 10 MG PO CAPS: 10.0000 mg | ORAL_CAPSULE | Freq: Every day | ORAL | Status: DC

## 2014-11-03 MED ORDER — AZITHROMYCIN 500 MG PO TABS
500.0000 mg | ORAL_TABLET | ORAL | Status: DC
Start: 2014-11-03 — End: 2014-11-06
  Administered 2014-11-03 – 2014-11-05 (×3): 500 mg via ORAL
  Filled 2014-11-03 (×4): qty 1

## 2014-11-03 MED ORDER — IPRATROPIUM-ALBUTEROL 0.5-2.5 (3) MG/3ML IN SOLN
3.0000 mL | Freq: Three times a day (TID) | RESPIRATORY_TRACT | Status: DC
  Administered 2014-11-03 – 2014-11-05 (×7): 3 mL via RESPIRATORY_TRACT
  Filled 2014-11-03 (×9): qty 3

## 2014-11-03 MED ORDER — ACETAMINOPHEN 325 MG PO TABS
650.0000 mg | ORAL_TABLET | Freq: Three times a day (TID) | ORAL | Status: DC | PRN
  Administered 2014-11-05: 650 mg via ORAL
  Filled 2014-11-03: qty 2

## 2014-11-03 MED ORDER — CALCIUM GLUCONATE 500 MG PO TABS
1.0000 | ORAL_TABLET | Freq: Every day | ORAL | Status: DC
  Administered 2014-11-03 – 2014-11-05 (×2): 500 mg via ORAL
  Filled 2014-11-03 (×5): qty 1

## 2014-11-03 MED ORDER — MORPHINE SULFATE 2 MG/ML IJ SOLN: 1.0000 mg | INTRAMUSCULAR | Status: DC | PRN

## 2014-11-03 MED ORDER — BISACODYL 5 MG PO TBEC: 5.0000 mg | DELAYED_RELEASE_TABLET | Freq: Every day | ORAL | Status: DC | PRN

## 2014-11-03 MED ORDER — ACYCLOVIR 800 MG PO TABS
800.0000 mg | ORAL_TABLET | Freq: Two times a day (BID) | ORAL | Status: DC
  Administered 2014-11-05 – 2014-11-06 (×2): 800 mg via ORAL
  Filled 2014-11-03 (×9): qty 1

## 2014-11-03 MED ORDER — PROCHLORPERAZINE MALEATE 10 MG PO TABS: 10.0000 mg | ORAL_TABLET | Freq: Three times a day (TID) | ORAL | Status: DC | PRN

## 2014-11-03 MED ORDER — IPRATROPIUM-ALBUTEROL 0.5-2.5 (3) MG/3ML IN SOLN
3.0000 mL | Freq: Four times a day (QID) | RESPIRATORY_TRACT | Status: DC
  Administered 2014-11-03: 3 mL via RESPIRATORY_TRACT
  Filled 2014-11-03: qty 3

## 2014-11-03 MED ORDER — ASPIRIN EC 325 MG PO TBEC
325.0000 mg | DELAYED_RELEASE_TABLET | Freq: Every day | ORAL | Status: DC
  Administered 2014-11-03 – 2014-11-05 (×2): 325 mg via ORAL
  Filled 2014-11-03 (×6): qty 1

## 2014-11-03 MED ORDER — PERFLUTREN LIPID MICROSPHERE
1.0000 mL | INTRAVENOUS | Status: AC | PRN
  Administered 2014-11-03: 4 mL via INTRAVENOUS
  Filled 2014-11-03: qty 10

## 2014-11-03 MED ORDER — METHYLPREDNISOLONE SODIUM SUCC 125 MG IJ SOLR
60.0000 mg | Freq: Every day | INTRAMUSCULAR | Status: DC
  Administered 2014-11-03: 60 mg via INTRAVENOUS
  Filled 2014-11-03: qty 2
  Filled 2014-11-03: qty 0.96

## 2014-11-03 MED ORDER — SODIUM CHLORIDE 0.9 % IV SOLN
INTRAVENOUS | Status: DC
  Administered 2014-11-03 – 2014-11-04 (×6): via INTRAVENOUS

## 2014-11-03 MED ORDER — ONDANSETRON HCL 4 MG/2ML IJ SOLN
4.0000 mg | Freq: Four times a day (QID) | INTRAMUSCULAR | Status: DC | PRN
  Administered 2014-11-03 – 2014-11-06 (×3): 4 mg via INTRAVENOUS
  Filled 2014-11-03 (×4): qty 2

## 2014-11-03 NOTE — Progress Notes (Addendum)
Patient ID: Mark [name], male   DOB: 25-Mar-1971, 44 y.o.   MRN: [id number] [facility] PROGRESS NOTE  Mark [name] JQZ:[id number] DOB: Mar 09, 1971 DOA: 11/02/2014 PCP: No PCP Per Patient  Brief narrative:    82 -year-old male with past medical history multiple myeloma, status post chemotherapy in remission until recently when he was started back on chemotherapy 10/31/2014. Patient presented to [facility] ED with complaints of sore throat, headaches, nausea, generalized malaise for past couple of days prior to this admission. Patient reported fevers as high as 104.14F. He used ibuprofen with minimal symptomatic relief. Patient also reported intermittent nausea and nonbloody vomiting. He had poor oral intake for the last couple of days. He was given Augmentin by Dr. Vicente Males for about a couple of days ago but his symptoms did not significantly improve. Of note, he did have positive influenza A on PCR 11/01/2014. He is on Tamiflu.  On admission, patient had fever of 102.7F, he was tachycardic and tachypnea, oxygen saturation 89% on room air. Blood work showed hemoglobin of 12.3, platelets 127, creatinine 1.47. Chest x-ray on the admission showed a developing patchy nodular perihilar infiltration in the lungs likely reflecting early infectious process. He was started on azithromycin and Rocephin.  Assessment/Plan:    Principal problem: Acute respiratory failure with hypoxia / lobar pneumonia / influenza pneumonia - Respiratory status stable this morning. Patient saturating 97% with nasal cannula oxygen support. - As noted above, on 11/01/2014 his influenza panel was positive for influenza A, H1 N1. He is currently on Tamiflu. - Since chest x-ray showed nodular perihilar infiltration concerning for pneumonia, patient was started on azithromycin and Rocephin. - Strep pneumonia is negative. Legionella is pending.  Active problems: Sepsis secondary to lobar pneumonia and influenza pneumonitis -  Sepsis criteria met on the admission with tachycardia, tachypnea, fever, hypoxia. Evidence of infection seen on chest x-ray in addition to influenza panel positive for influenza A, H1 N1. Lactic acid within normal limits. - Continue azithromycin and Rocephin. Continue Tamiflu. - Follow up blood culture results.  Multiple myeloma - Continue home acyclovir regimen 800 mg BID - Continue Linalidomide 10 mg daily - Dr. Marin Olp informed of patient's admission   Anemia of chronic disease - Secondary to history multiple myeloma, chemotherapy - Hemoglobin is stable at 9.9. Drop noted from yesterday hemoglobin value of 11.2 which is likely dilutional from IV fluids - Continue to monitor CBC daily.  Thrombocytopenia - Secondary to history of malignancy and chemotherapy - Platelet count 115 this morning. Stable.  Acute renal failure - Likely secondary to sepsis - Resolved with IV fluids.    DVT Prophylaxis  - Lovenox subcutaneous ordered   Code Status: Full.  Family Communication:  Family not at the bedside Disposition Plan: Transfer to telemetry floor today.   IV access:  Peripheral IV  Procedures and diagnostic studies:    Dg Chest 2 View 11/02/2014  Developing patchy nodular perihilar infiltration in the lungs probably indicating developing inflammatory or infectious process.   Electronically Signed   By: Mark [name] M.D.   On: 11/02/2014 22:36   Dg Chest 2 View 11/01/2014  No acute abnormality seen.   Electronically Signed   By: Mark [name] M.D.   On: 11/01/2014 07:31    Medical Consultants:  None   Other Consultants:  None   IAnti-Infectives:   Azithromycin 11/03/2014 --> Rocephin 11/03/2014 --> Tamiflu 11/03/2014 -->   Mark Lenz, MD  [facility] Pager [phone]  If 7PM-7AM, please contact night-coverage [email or web address]  Password [email or web address] 11/03/2014, 11:24 AM   LOS: 1 day    HPI/Subjective: No acute overnight events.  Objective: Filed Vitals:   11/03/14  0400 11/03/14 0600 11/03/14 0800 11/03/14 1040  BP: 136/86 154/91 145/94 127/78  Pulse: 102 99 89 100  Temp: 101 F (38.3 C)  100.7 F (38.2 C) 99.9 F (37.7 C)  TempSrc: Oral  Oral Oral  Resp: '30 27 21 22  ' Height:      Weight:      SpO2: 92% 97% 95% 97%    Intake/Output Summary (Last 24 hours) at 11/03/14 1124 Last data filed at 11/03/14 0745  Gross per 24 hour  Intake 5010.83 ml  Output   2600 ml  Net 2410.83 ml    Exam:   General:  Pt is alert, follows commands appropriately, not in acute distress  Cardiovascular: Regular rate and rhythm, S1/S2 (+)  Respiratory: Clear to auscultation bilaterally, no wheezing, no crackles, no rhonchi  Abdomen: Soft, non tender, non distended, bowel sounds present  Extremities: No edema, pulses palpable bilaterally  Neuro: Grossly nonfocal  Data Reviewed: Basic Metabolic Panel:  Recent Labs Lab 10/28/14 0932 11/01/14 0020 11/02/14 2138 11/03/14 0430  NA 144 136 137 139  K 4.1 4.6 4.2 4.3  CL 106 97 101 107  CO2 '30 29 26 25  ' GLUCOSE 95 123* 153* 134*  BUN '17 14 13 9  ' CREATININE 1.3* 1.47* 1.25 1.14  CALCIUM 9.8 9.3 8.9 8.2*  MG  --   --   --  2.0   Liver Function Tests:  Recent Labs Lab 10/28/14 0932 11/01/14 0020 11/02/14 2138 11/03/14 0430  AST '19 24 26 21  ' ALT '15 18 19 18  ' ALKPHOS 61 61 60 54  BILITOT 1.50 1.4* 1.1 0.7  PROT 7.5 8.6* 7.8 7.5  ALBUMIN  --  4.7 4.3 3.7    Recent Labs Lab 11/02/14 2138  LIPASE 23   No results for input(s): AMMONIA in the last 168 hours. CBC:  Recent Labs Lab 10/28/14 0932 11/01/14 0020 11/02/14 2138 11/03/14 0430  WBC 3.4* 6.7 5.3 5.4  NEUTROABS 1.5 5.1 4.4 4.8  HGB 11.0* 12.3* 11.2* 9.9*  HCT 31.9* 36.8* 33.9* 29.7*  MCV 95 95.6 96.0 95.8  PLT 164 127* 127* 115*   Cardiac Enzymes:  Recent Labs Lab 11/02/14 2138  TROPONINI <0.03   BNP: Invalid input(s): POCBNP CBG: No results for input(s): GLUCAP in the last 168 hours.  Rapid strep screen   (If  patient has fever and/or without cough or runny nose)     Status: None   Collection Time: 11/01/14 12:08 AM  Result Value Ref Range Status   Streptococcus, Group A Screen (Direct) NEGATIVE NEGATIVE Final  Blood culture (routine x 2)     Status: None (Preliminary result)   Collection Time: 11/01/14  6:44 AM  Result Value Ref Range Status   Specimen Description BLOOD RIGHT ANTECUBITAL  Final   Special Requests BOTTLES DRAWN AEROBIC AND ANAEROBIC 5CC EACH  Final   Culture   Final           BLOOD CULTURE RECEIVED NO GROWTH TO DATE     Report Status PENDING  Incomplete  Blood culture (routine x 2)     Status: None (Preliminary result)   Collection Time: 11/01/14  6:45 AM  Result Value Ref Range Status   Specimen Description BLOOD LEFT HAND  Final   Special Requests BOTTLES DRAWN AEROBIC AND ANAEROBIC [facility] EACH  Final  Culture   Final           BLOOD CULTURE RECEIVED NO GROWTH TO DATE     Report Status PENDING  Incomplete  Urine culture     Status: None   Collection Time: 11/01/14  7:05 AM  Result Value Ref Range Status   Specimen Description URINE, RANDOM  Final   Special Requests NONE  Final   Culture NO GROWTH Performed at [facility]   Final   Report Status 11/02/2014 FINAL  Final  Rapid strep screen     Status: None   Collection Time: 11/02/14  9:53 PM  Result Value Ref Range Status   Streptococcus, Group A Screen (Direct) NEGATIVE NEGATIVE Final  MRSA PCR Screening     Status: None   Collection Time: 11/03/14 12:29 AM  Result Value Ref Range Status   MRSA by PCR NEGATIVE NEGATIVE Final  Culture, sputum-assessment     Status: None   Collection Time: 11/03/14  4:45 AM  Result Value Ref Range Status   Specimen Description SPUTUM  Final   Special Requests NONE  Final   Sputum evaluation   Final    THIS SPECIMEN IS ACCEPTABLE. RESPIRATORY CULTURE REPORT TO FOLLOW.   Report Status 11/03/2014 FINAL  Final     Scheduled Meds: . acyclovir  800 mg Oral BID  . aspirin  EC  325 mg Oral Daily  . azithromycin  500 mg Oral Q24H  . calcium gluconate  1 tablet Oral Daily  . cefTRIAXone (ROCEPHIN)  IV  1 g Intravenous Q24H  . chlorhexidine  15 mL Mouth Rinse BID  . dextromethorphan-guaiFENesin  1 tablet Oral BID  . enoxaparin (LOVENOX) injection  40 mg Subcutaneous QHS  . fentaNYL  25 mcg Transdermal Q72H  . gabapentin  600 mg Oral BID  . ipratropium-albuterol  3 mL Nebulization TID  . lenalidomide  10 mg Oral Daily  . methylPREDNISolone (SOLU-MEDROL) injection  60 mg Intravenous QHS  . oseltamivir  75 mg Oral BID   Continuous Infusions: . sodium chloride 200 mL/hr at 11/03/14 [phone]

## 2014-11-03 NOTE — Consult Note (Signed)
Referral MD  Reason for Referral:  [place]  Light Chain Myeloma  Chief Complaint  Patient presents with  . Chest Pain  . Sore Throat  . Nausea  :  Fever secondary to H1 N1 influenza  HPI:  [name] is a nice 44 year old African-American gentleman. 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 This is a [employer]. He's been in remission.  He had a lumbar laminectomy and decompression about a month ago. This is not from myeloma. This has helped.  He has been on Revlimid and Velcade. This is a maintenance type program.  He was admitted with a temperature. He was found to be positive for influenza.   Last saw him in late March, there was no monoclonal spike. His light chain was 2.57 mg/dL.  He had a high temperature. Chest x-ray was negative. He was admitted in April 2.  He is feeling better. He still has a little bit of a temperature.   I was informed of his admission and am seeing him to try to help out.   his CBC today shows white count 5.4. Hemoglobin 9.9. Platelet count  115. His electrolytes look pretty good. Calcium 8.2. His liver function tests are okay. Blood sugar is a little on the higher side.     Past Medical History  Diagnosis Date  . History of radiation therapy 02/07/13- 02/26/13    lower L spine, upper sacrum, 35 gray in 14 fractions  . Cancer   . Multiple myeloma dx'd 01/2013  . FH: chemotherapy     Dr. Burney Gauze  :  Past Surgical History  Procedure Laterality Date  . Portacath placement    . Bone marrow transplant    . Kyphoplasty  05/20/14  . Lumbar laminectomy/decompression microdiscectomy N/A 09/25/2014    Procedure: LUMBAR LAMINECTOMY/DECOMPRESSION MICRODISCECTOMY;  Surgeon: Sinclair Ship, MD;  Location: [place];  Service: Orthopedics;  Laterality: N/A;  Lumbar 4-5, L5-S1  decompression  :   Current facility-administered medications:  .  0.9 %  sodium chloride infusion, ,  Intravenous, Continuous, Allie Bossier, MD, Last Rate: 200 mL/hr at 11/03/14 1845 .  acetaminophen (TYLENOL) tablet 650 mg, 650 mg, Oral, TID PRN, Allie Bossier, MD .  acyclovir (ZOVIRAX) tablet 800 mg, 800 mg, Oral, BID, Allie Bossier, MD, 800 mg at 11/03/14 0103 .  antiseptic oral rinse (CPC / CETYLPYRIDINIUM CHLORIDE 0.05%) solution 7 mL, 7 mL, Mouth Rinse, q12n4p, Robbie Lis, MD, 7 mL at 11/03/14 1325 .  aspirin EC tablet 325 mg, 325 mg, Oral, Daily, Allie Bossier, MD, 325 mg at 11/03/14 1013 .  azithromycin [facility]) tablet 500 mg, 500 mg, Oral, Q24H, Allie Bossier, MD .  bisacodyl (DULCOLAX) EC tablet 5 mg, 5 mg, Oral, Daily PRN, Allie Bossier, MD .  calcium gluconate tablet 500 mg, 1 tablet, Oral, Daily, Allie Bossier, MD, 500 mg at 11/03/14 1013 .  cefTRIAXone (ROCEPHIN) 1 g in dextrose 5 % 50 mL IVPB, 1 g, Intravenous, Q24H, Allie Bossier, MD .  chlorhexidine (PERIDEX) 0.12 % solution 15 mL, 15 mL, Mouth Rinse, BID, Robbie Lis, MD, 15 mL at 11/03/14 0906 .  dextromethorphan-guaiFENesin (MUCINEX DM) 30-600 MG per 12 hr tablet 1 tablet, 1 tablet, Oral, BID, Allie Bossier, MD, 1 tablet at 11/03/14 1013 .  enoxaparin (LOVENOX) injection 40 mg, 40 mg, Subcutaneous, QHS, Allie Bossier, MD, 40 mg  at 11/03/14 0105 .  fentaNYL (DURAGESIC - dosed mcg/hr) patch 25 mcg, 25 mcg, Transdermal, Q72H, Allie Bossier, MD, 25 mcg at 11/03/14 0104 .  gabapentin (NEURONTIN) capsule 600 mg, 600 mg, Oral, BID, Allie Bossier, MD, 600 mg at 11/03/14 1012 .  ipratropium-albuterol (DUONEB) 0.5-2.5 (3) MG/3ML nebulizer solution 3 mL, 3 mL, Nebulization, TID, Allie Bossier, MD, 3 mL at 11/03/14 1425 .  lenalidomide (REVLIMID) capsule 10 mg, 10 mg, Oral, Daily, Allie Bossier, MD, 10 mg at 11/03/14 1015 .  morphine 2 MG/ML injection 1-2 mg, 1-2 mg, Intravenous, Q3H PRN, Allie Bossier, MD .  ondansetron [facility]) injection 4 mg, 4 mg, Intravenous, Q6H PRN, Allie Bossier, MD, 4 mg at 11/03/14 0103 .   oseltamivir (TAMIFLU) capsule 75 mg, 75 mg, Oral, BID, Allie Bossier, MD, 75 mg at 11/03/14 1013 .  prochlorperazine (COMPAZINE) tablet 10 mg, 10 mg, Oral, Q8H PRN, Allie Bossier, MD:  . acyclovir  800 mg Oral BID  . antiseptic oral rinse  7 mL Mouth Rinse q12n4p  . aspirin EC  325 mg Oral Daily  . azithromycin  500 mg Oral Q24H  . calcium gluconate  1 tablet Oral Daily  . cefTRIAXone (ROCEPHIN)  IV  1 g Intravenous Q24H  . chlorhexidine  15 mL Mouth Rinse BID  . dextromethorphan-guaiFENesin  1 tablet Oral BID  . enoxaparin (LOVENOX) injection  40 mg Subcutaneous QHS  . fentaNYL  25 mcg Transdermal Q72H  . gabapentin  600 mg Oral BID  . ipratropium-albuterol  3 mL Nebulization TID  . lenalidomide  10 mg Oral Daily  . oseltamivir  75 mg Oral BID  :  No Known Allergies:  Family History  Problem Relation Age of Onset  . Diabetic kidney disease Mother   . Hypertension Mother   . Heart attack Mother   . Kidney failure Mother   . Coronary artery disease Mother   . HIV Father   :  History   Social History  . Marital Status: Married    Spouse Name: N/A  . Number of Children: N/A  . Years of Education: N/A   Occupational History  . Not on file.   Social History Main Topics  . Smoking status: Former Smoker -- 1.00 packs/day for 15 years    Types: Cigarettes    Start date: 06/29/1988    Quit date: 06/29/2003  . Smokeless tobacco: Never Used     Comment: quit smoking 10 years ago  . Alcohol Use: No  . Drug Use: No  . Sexual Activity: No   Other Topics Concern  . Not on file   Social History Narrative  :  Pertinent items are noted in HPI.  Exam: Patient Vitals for the past 24 hrs:  BP Temp Temp src Pulse Resp SpO2 Height Weight  11/03/14 1425 - - - - - 96 % - -  11/03/14 1040 127/78 mmHg 99.9 F (37.7 C) Oral 100 (!) 22 97 % - -  11/03/14 0800 (!) 145/94 mmHg (!) 100.7 F (38.2 C) Oral 89 (!) 21 95 % - -  11/03/14 0600 (!) 154/91 mmHg - - 99 (!) 27 97 % - -   11/03/14 0400 136/86 mmHg (!) 101 F (38.3 C) Oral (!) 102 (!) 30 92 % - -  11/03/14 0200 (!) 144/85 mmHg (!) 101.8 F (38.8 C) Oral (!) 109 (!) 33 96 % - -  11/03/14 0140 - - - - - 94 % - -  11/03/14 0000 127/71 mmHg (!) 102.1 F (38.9 C) - 113 (!) 38 94 % - -  11/02/14 2356 - 102.1 F (38.9 C) Oral - - - - -  11/02/14 2318 - - - - - - _0  (1.803 m) 211 lb 13.8 oz (96.1 kg)  11/02/14 2317 148/94 mmHg - - 114 26 95 % - -  11/02/14 2315 148/94 mmHg - - 116 (!) 36 (!) 89 % - -  11/02/14 2300 130/88 mmHg - - 115 (!) 33 98 % - -  11/02/14 2245 134/84 mmHg - - - (!) 36 - - -  11/02/14 2230 131/83 mmHg - - - (!) 33 - - -  11/02/14 2215 140/85 mmHg - - - (!) 27 - - -  11/02/14 2144 - 102.8 F (39.3 C) Rectal - - - - -  11/02/14 2034 100/77 mmHg 99.5 F (37.5 C) Oral 118 20 99 % - -    well-developed and well-nourished African-American gentleman. Head and neck exam shows no ocular or oral lesions. He has no palpable cervical or supraclavicular lymph nodes. Lungs are clear. Cardiac exam regular rate and rhythm with no murmurs, rubs or bruis. Abdomen is soft. He has good bowel sounds. There is no fluid wave. There is no palpable liver or spleen tip. Skin exam shows no rashes. Neurological exam is nonfocal.   Recent Labs  11/02/14 2138 11/03/14 0430  WBC 5.3 5.4  HGB 11.2* 9.9*  HCT 33.9* 29.7*  PLT 127* 115*    Recent Labs  11/02/14 2138 11/03/14 0430  NA 137 139  K 4.2 4.3  CL 101 107  CO2 26 25  GLUCOSE 153* 134*  BUN 13 9  CREATININE 1.25 1.14  CALCIUM 8.9 8.2*    Blood smear review:  none  Pathology: none    Assessment and Plan:  Mark [name]  Is a 44 year old African-American gentleman with light chain myeloma. He did undergo stem cell transplant. He's been in remission. His immune system should be okay.   I don't see anything special that we have to do for him right now. I would just treat him with the Tamiflu.   I don't think he needs IVIG.   We will  certainly follow along and help ut in any way possible.   I'm glad that he is doing better from his back surgery. He is having a lot of pain because of severe spinal stenosis.  Laurey Arrow

## 2014-11-03 NOTE — Progress Notes (Addendum)
Echocardiogram 2D Echocardiogram with definity has been performed.  Joelene Millin 11/03/2014, 2:55 PM

## 2014-11-04 LAB — CBC
HEMATOCRIT: 26.4 % — AB (ref 39.0–52.0)
Hemoglobin: 8.7 g/dL — ABNORMAL LOW (ref 13.0–17.0)
MCH: 31.6 pg (ref 26.0–34.0)
MCHC: 33 g/dL (ref 30.0–36.0)
MCV: 96 fL (ref 78.0–100.0)
Platelets: 115 10*3/uL — ABNORMAL LOW (ref 150–400)
RBC: 2.75 MIL/uL — AB (ref 4.22–5.81)
RDW: 14 % (ref 11.5–15.5)
WBC: 4 10*3/uL (ref 4.0–10.5)

## 2014-11-04 LAB — URINE CULTURE
Colony Count: NO GROWTH
Culture: NO GROWTH

## 2014-11-04 LAB — LEGIONELLA ANTIGEN, URINE

## 2014-11-04 MED ORDER — PHENOL 1.4 % MT LIQD
1.0000 | OROMUCOSAL | Status: DC | PRN
  Filled 2014-11-04: qty 177

## 2014-11-04 MED ORDER — SODIUM CHLORIDE 0.9 % IJ SOLN
10.0000 mL | INTRAMUSCULAR | Status: DC | PRN
  Administered 2014-11-07: 10 mL
  Filled 2014-11-04: qty 40

## 2014-11-04 NOTE — Progress Notes (Signed)
UR complete 

## 2014-11-04 NOTE — Progress Notes (Signed)
Patient ID: Mark [name], male   DOB: Aug 29, 1970, 44 y.o.   MRN: [id number] [facility] PROGRESS NOTE  Mark [name] HCW:[id number] DOB: [birth year] DOA: 11/02/2014 PCP: No PCP Per Patient  Brief narrative:    12 -year-old male with past medical history multiple myeloma, status post chemotherapy in remission until recently when he was started back on chemotherapy 10/31/2014. Patient presented to [facility] long ED with complaints of sore throat, headaches, nausea, generalized malaise for past couple of days prior to this admission. Patient reported fevers as high as 104.67F. He used ibuprofen with minimal symptomatic relief. Patient also reported intermittent nausea and nonbloody vomiting. He had poor oral intake for the last couple of days. He was given Augmentin by Dr. Vicente Males for about a couple of days ago but his symptoms did not significantly improve. Of note, he did have positive influenza A on PCR 11/01/2014. He is on Tamiflu.  On admission, patient had fever of 102.5F, he was tachycardic and tachypnea, oxygen saturation 89% on room air. Blood work showed hemoglobin of 12.3, platelets 127, creatinine 1.47. Chest x-ray on the admission showed a developing patchy nodular perihilar infiltration in the lungs likely reflecting early infectious process. He was started on azithromycin and Rocephin.  Assessment/Plan:    Principal problem: Acute respiratory failure with hypoxia / lobar pneumonia / influenza pneumonia - Respiratory status remains stable. - As noted above, on 11/01/2014 his influenza panel was positive for influenza A, H1 N1. Continue Tamiflu. - [place]chest x-ray on admission showed nodular perihilar infiltration concerning for pneumonia, patient was started on azithromycin and Rocephin. - So far strep pneumonia is negative, Legionella is pending. Blood cultures are negative. Respiratory culture was re-incubated for better growth. Respiratory virus panel is pending.  Active problems: Sepsis  secondary to lobar pneumonia and influenza pneumonitis - Sepsis criteria met on the admission with tachycardia, tachypnea, fever, hypoxia. Evidence of infection seen on chest x-ray in addition to influenza panel positive for influenza A, H1 N1. Lactic acid within normal limits. - Continue azithromycin and Rocephin. Continue Tamiflu. - Blood cultures so far show no growth to date.  Multiple myeloma - Continue home acyclovir regimen 800 mg BID - Continue Linalidomide 10 mg daily - Dr. Marin Olp informed of patient's admission; appreciate his recommendations.  Anemia of chronic disease - Secondary to history multiple myeloma, chemotherapy - Hemoglobin is stable at 8.7. No reports of bleeding. No current indications for transfusion. - Continue to monitor CBC daily.  Thrombocytopenia - Secondary to history of malignancy and chemotherapy - Platelets 115 on 11/03/2014. Platelet count pending this morning.  Acute renal failure - Likely secondary to sepsis - Resolved with IV fluids.    DVT Prophylaxis  - Lovenox subcutaneous ordered   Code Status: Full.  Family Communication:  Family not at the bedside Disposition Plan: Home likely in next 24 hours if patient feels better.   IV access:  Peripheral IV  Procedures and diagnostic studies:    Dg Chest 2 View 11/02/2014  Developing patchy nodular perihilar infiltration in the lungs probably indicating developing inflammatory or infectious process.   Electronically Signed   By: Lucienne Capers M.D.   On: 11/02/2014 22:36   Dg Chest 2 View 11/01/2014  No acute abnormality seen.   Electronically Signed   By: Inez Catalina M.D.   On: 11/01/2014 07:31   Medical Consultants:  None   Other Consultants:  None   IAnti-Infectives:   Azithromycin 11/03/2014 --> Rocephin 11/03/2014 --> Tamiflu 11/03/2014 -->   DEVINE,  Dedra Skeens, MD  [facility] Pager [phone]  If 7PM-7AM, please contact night-coverage [email or web address] Password [email or web address] 11/04/2014,  10:12 AM   LOS: 2 days    HPI/Subjective: No acute overnight events.  Objective: Filed Vitals:   11/03/14 1938 11/03/14 2132 11/04/14 0540 11/04/14 0803  BP:  132/87 124/68   Pulse:  71 59   Temp:  98.8 F (37.1 C) 98.9 F (37.2 C)   TempSrc:  Oral Oral   Resp:  18 20   Height:      Weight:      SpO2: 95% 98% 99% 92%    Intake/Output Summary (Last 24 hours) at 11/04/14 1012 Last data filed at 11/04/14 0502  Gross per 24 hour  Intake   2283 ml  Output      0 ml  Net   2283 ml    Exam:   General:  Pt is alert, no distress  Cardiovascular: Regular rate and rhythm, S1/S2 (+)  Respiratory: No wheezing, rhonchi  Abdomen: Nondistended, nontender abdomen, appreciate bowel sounds  Extremities: No lower extremity swelling, palpable pulses  Neuro: Grossly nonfocal  Data Reviewed: Basic Metabolic Panel:  Recent Labs Lab 11/01/14 0020 11/02/14 2138 11/03/14 0430  NA 136 137 139  K 4.6 4.2 4.3  CL 97 101 107  CO2 _0 GLUCOSE 123* 153* 134*  BUN _1 CREATININE 1.47* 1.25 1.14  CALCIUM 9.3 8.9 8.2*  MG  --   --  2.0   Liver Function Tests:  Recent Labs Lab 11/01/14 0020 11/02/14 2138 11/03/14 0430  AST _2 ALT _3 ALKPHOS 61 60 54  BILITOT 1.4* 1.1 0.7  PROT 8.6* 7.8 7.5  ALBUMIN 4.7 4.3 3.7    Recent Labs Lab 11/02/14 2138  LIPASE 23   No results for input(s): AMMONIA in the last 168 hours. CBC:  Recent Labs Lab 11/01/14 0020 11/02/14 2138 11/03/14 0430 11/04/14 0928  WBC 6.7 5.3 5.4 4.0  NEUTROABS 5.1 4.4 4.8  --   HGB 12.3* 11.2* 9.9* 8.7*  HCT 36.8* 33.9* 29.7* 26.4*  MCV 95.6 96.0 95.8 96.0  PLT 127* 127* 115* PENDING   Cardiac Enzymes:  Recent Labs Lab 11/02/14 2138  TROPONINI <0.03   BNP: Invalid input(s): POCBNP CBG: No results for input(s): GLUCAP in the last 168 hours.  Rapid strep screen   (If patient has fever and/or without cough or runny nose)     Status: None   Collection Time:  11/01/14 12:08 AM  Result Value Ref Range Status   Streptococcus, Group A Screen (Direct) NEGATIVE NEGATIVE Final  Blood culture (routine x 2)     Status: None (Preliminary result)   Collection Time: 11/01/14  6:44 AM  Result Value Ref Range Status   Specimen Description BLOOD RIGHT ANTECUBITAL  Final   Special Requests BOTTLES DRAWN AEROBIC AND ANAEROBIC 5CC EACH  Final   Culture   Final           BLOOD CULTURE RECEIVED NO GROWTH TO DATE     Report Status PENDING  Incomplete  Blood culture (routine x 2)     Status: None (Preliminary result)   Collection Time: 11/01/14  6:45 AM  Result Value Ref Range Status   Specimen Description BLOOD LEFT HAND  Final   Special Requests BOTTLES DRAWN AEROBIC AND ANAEROBIC 5CC EACH  Final   Culture   Final           BLOOD CULTURE  RECEIVED NO GROWTH TO DATE     Report Status PENDING  Incomplete  Urine culture     Status: None   Collection Time: 11/01/14  7:05 AM  Result Value Ref Range Status   Specimen Description URINE, RANDOM  Final   Special Requests NONE  Final   Culture NO GROWTH Performed at [facility]   Final   Report Status 11/02/2014 FINAL  Final  Rapid strep screen     Status: None   Collection Time: 11/02/14  9:53 PM  Result Value Ref Range Status   Streptococcus, Group A Screen (Direct) NEGATIVE NEGATIVE Final  MRSA PCR Screening     Status: None   Collection Time: 11/03/14 12:29 AM  Result Value Ref Range Status   MRSA by PCR NEGATIVE NEGATIVE Final  Culture, sputum-assessment     Status: None   Collection Time: 11/03/14  4:45 AM  Result Value Ref Range Status   Specimen Description SPUTUM  Final   Special Requests NONE  Final   Sputum evaluation   Final    THIS SPECIMEN IS ACCEPTABLE. RESPIRATORY CULTURE REPORT TO FOLLOW.   Report Status 11/03/2014 FINAL  Final     Scheduled Meds: . acyclovir  800 mg Oral BID  . aspirin EC  325 mg Oral Daily  . azithromycin  500 mg Oral Q24H  . calcium gluconate  1 tablet  Oral Daily  . cefTRIAXone (ROCEPHIN)  IV  1 g Intravenous Q24H  . chlorhexidine  15 mL Mouth Rinse BID  . dextromethorphan-guaiFENesin  1 tablet Oral BID  . enoxaparin (LOVENOX) injection  40 mg Subcutaneous QHS  . fentaNYL  25 mcg Transdermal Q72H  . gabapentin  600 mg Oral BID  . ipratropium-albuterol  3 mL Nebulization TID  . lenalidomide  10 mg Oral Daily  . oseltamivir  75 mg Oral BID   Continuous Infusions: . sodium chloride 200 mL/hr at 11/04/14 0458

## 2014-11-05 DIAGNOSIS — G43A0 Cyclical vomiting, not intractable: Secondary | ICD-10-CM

## 2014-11-05 DIAGNOSIS — N179 Acute kidney failure, unspecified: Secondary | ICD-10-CM

## 2014-11-05 LAB — CULTURE, GROUP A STREP: Strep A Culture: NEGATIVE

## 2014-11-05 LAB — RESPIRATORY VIRUS PANEL
Adenovirus: NEGATIVE
Influenza A: POSITIVE — AB
Influenza B: NEGATIVE
Metapneumovirus: NEGATIVE
PARAINFLUENZA 2 A: NEGATIVE
Parainfluenza 1: NEGATIVE
Parainfluenza 3: NEGATIVE
Respiratory Syncytial Virus A: NEGATIVE
Respiratory Syncytial Virus B: NEGATIVE
Rhinovirus: NEGATIVE

## 2014-11-05 MED ORDER — PANTOPRAZOLE SODIUM 40 MG PO TBEC
40.0000 mg | DELAYED_RELEASE_TABLET | Freq: Every day | ORAL | Status: DC
  Filled 2014-11-05 (×2): qty 1

## 2014-11-05 MED ORDER — IPRATROPIUM-ALBUTEROL 0.5-2.5 (3) MG/3ML IN SOLN: 3.0000 mL | RESPIRATORY_TRACT | Status: DC | PRN

## 2014-11-05 MED ORDER — PHENOL 1.4 % MT LIQD
2.0000 | OROMUCOSAL | Status: DC | PRN
  Filled 2014-11-05: qty 177

## 2014-11-05 NOTE — Progress Notes (Signed)
Patient ID: Mark [name], male   DOB: 1970/08/08, 44 y.o.   MRN: [id number] [facility] PROGRESS NOTE  Binnie Vonderhaar [id number]:[id number] DOB: 1971/05/18 DOA: 11/02/2014 PCP: No PCP Per Patient  Brief narrative:    47 -year-old male with past medical history multiple myeloma, status post chemotherapy in remission until recently when he was started back on chemotherapy 10/31/2014. Patient presented to [facility] long ED with complaints of sore throat, headaches, nausea, generalized malaise for past couple of days prior to this admission. Patient reported fevers as high as 104.12F. He used ibuprofen with minimal symptomatic relief. Patient also reported intermittent nausea and nonbloody vomiting. He had poor oral intake for the last couple of days. He was given Augmentin by Dr. Vicente Males for about a couple of days ago but his symptoms did not significantly improve. Of note, he did have positive influenza A on PCR 11/01/2014. He is on Tamiflu.  On admission, patient had fever of 102.52F, he was tachycardic and tachypnea, oxygen saturation 89% on room air. Blood work showed hemoglobin of 12.3, platelets 127, creatinine 1.47. Chest x-ray on the admission showed a developing patchy nodular perihilar infiltration in the lungs likely reflecting early infectious process. He was started on azithromycin and Rocephin.  Assessment/Plan:    Principal problem: Acute respiratory failure with hypoxia / lobar pneumonia / influenza pneumonia -Respiratory status remains stable. -will continue current IV abx's and Tamiflu -Chest x-ray on admission showed nodular perihilar infiltration concerning for pneumonia -So far strep pneumonia and Legionella antigen are negative. Blood cultures are negative.  -continue supportive care and symptomatic treatment  Sepsis secondary to lobar pneumonia and influenza pneumonitis - Sepsis criteria met on the admission with tachycardia, tachypnea, fever, hypoxia. Evidence of infection seen on  chest x-ray in addition to influenza panel positive for influenza A, H1 N1.  -Lactic acid within normal limits. -Continue azithromycin and Rocephin. Continue Tamiflu. -Blood cultures so far w/o growth up to date. -plan is to transition to PO levaquin once tolerating PO's better  Multiple myeloma -Per Dr. Marin Olp discussion with patient ok to hold on acyclovir regimen and Revlemid -will follow rec's  Anemia of chronic disease - Secondary to history multiple myeloma, chemotherapy - Hemoglobin is stable at 8.7. No reports of bleeding. No current indications for transfusion. - Continue to monitor CBC   Thrombocytopenia - Secondary to history of malignancy and chemotherapy - Platelets 115 on 11/04/2014.  -no signs of acute bleeding; if platelets drops below 95,000 will d/c lovenox  Acute renal failure - Likely secondary to decrease perfusion with sepsis - Resolved with IV fluids. -Will monitor renal function  DVT Prophylaxis  - Lovenox subcutaneous ordered   Code Status: Full.  Family Communication:  Family not at the bedside Disposition Plan: Home likely in next 24-48 hours if patient able to tolerate PO's properly.   IV access:  Peripheral IV  Procedures and diagnostic studies:    Dg Chest 2 View 11/02/2014  Developing patchy nodular perihilar infiltration in the lungs probably indicating developing inflammatory or infectious process.   Electronically Signed   By: Lucienne Capers M.D.   On: 11/02/2014 22:36   Dg Chest 2 View 11/01/2014  No acute abnormality seen.   Electronically Signed   By: Inez Catalina M.D.   On: 11/01/2014 07:31   Medical Consultants:  None   Other Consultants:  None   IAnti-Infectives:   Azithromycin 11/03/2014 --> Rocephin 11/03/2014 --> Tamiflu 11/03/2014 -->   Barton Dubois, MD  [facility] Pager [phone]  If 7PM-7AM, please contact night-coverage [email or web address] Password [facility] 11/05/2014, 4:58 PM   LOS: 3 days     HPI/Subjective: Patient complaining of nausea and vomiting; no CP or SOB.   Objective: Filed Vitals:   11/04/14 1944 11/05/14 0500 11/05/14 0928 11/05/14 1449  BP:  126/86  123/66  Pulse:  63  70  Temp:  98.6 F (37 C)  98.4 F (36.9 C)  TempSrc:  Oral  Oral  Resp:  18  20  Height:      Weight:      SpO2: 97% 100% 98% 99%   No intake or output data in the 24 hours ending 11/05/14 1658  Exam:   General:  Pt is alert, awake and oriented X 3; no fever. Denies CP or SOB. Feeling sick on his stomach and with N/V; endorses having difficulty eating or taking pills.  Cardiovascular: Regular rate and rhythm, S1/S2 (+)  Respiratory: No wheezing, positive scattered rhonchi  Abdomen: Nondistended, nontender abdomen, appreciate bowel sounds  Extremities: No lower extremity swelling, palpable pulses  Neuro: Grossly nonfocal  Data Reviewed: Basic Metabolic Panel:  Recent Labs Lab 11/01/14 0020 11/02/14 2138 11/03/14 0430  NA 136 137 139  K 4.6 4.2 4.3  CL 97 101 107  CO2 '29 26 25  ' GLUCOSE 123* 153* 134*  BUN '14 13 9  ' CREATININE 1.47* 1.25 1.14  CALCIUM 9.3 8.9 8.2*  MG  --   --  2.0   Liver Function Tests:  Recent Labs Lab 11/01/14 0020 11/02/14 2138 11/03/14 0430  AST '24 26 21  ' ALT '18 19 18  ' ALKPHOS 61 60 54  BILITOT 1.4* 1.1 0.7  PROT 8.6* 7.8 7.5  ALBUMIN 4.7 4.3 3.7    Recent Labs Lab 11/02/14 2138  LIPASE 23   CBC:  Recent Labs Lab 11/01/14 0020 11/02/14 2138 11/03/14 0430 11/04/14 0928  WBC 6.7 5.3 5.4 4.0  NEUTROABS 5.1 4.4 4.8  --   HGB 12.3* 11.2* 9.9* 8.7*  HCT 36.8* 33.9* 29.7* 26.4*  MCV 95.6 96.0 95.8 96.0  PLT 127* 127* 115* 115*   Cardiac Enzymes:  Recent Labs Lab 11/02/14 2138  TROPONINI <0.03   BNP: Invalid input(s): POCBNP CBG: No results for input(s): GLUCAP in the last 168 hours.  Rapid strep screen   (If patient has fever and/or without cough or runny nose)     Status: None   Collection Time: 11/01/14  12:08 AM  Result Value Ref Range Status   Streptococcus, Group A Screen (Direct) NEGATIVE NEGATIVE Final  Blood culture (routine x 2)     Status: None (Preliminary result)   Collection Time: 11/01/14  6:44 AM  Result Value Ref Range Status   Specimen Description BLOOD RIGHT ANTECUBITAL  Final   Special Requests BOTTLES DRAWN AEROBIC AND ANAEROBIC 5CC EACH  Final   Culture   Final           BLOOD CULTURE RECEIVED NO GROWTH TO DATE     Report Status PENDING  Incomplete  Blood culture (routine x 2)     Status: None (Preliminary result)   Collection Time: 11/01/14  6:45 AM  Result Value Ref Range Status   Specimen Description BLOOD LEFT HAND  Final   Special Requests BOTTLES DRAWN AEROBIC AND ANAEROBIC 5CC EACH  Final   Culture   Final           BLOOD CULTURE RECEIVED NO GROWTH TO DATE     Report Status PENDING  Incomplete  Urine  culture     Status: None   Collection Time: 11/01/14  7:05 AM  Result Value Ref Range Status   Specimen Description URINE, RANDOM  Final   Special Requests NONE  Final   Culture NO GROWTH Performed at [facility]   Final   Report Status 11/02/2014 FINAL  Final  Rapid strep screen     Status: None   Collection Time: 11/02/14  9:53 PM  Result Value Ref Range Status   Streptococcus, Group A Screen (Direct) NEGATIVE NEGATIVE Final  MRSA PCR Screening     Status: None   Collection Time: 11/03/14 12:29 AM  Result Value Ref Range Status   MRSA by PCR NEGATIVE NEGATIVE Final  Culture, sputum-assessment     Status: None   Collection Time: 11/03/14  4:45 AM  Result Value Ref Range Status   Specimen Description SPUTUM  Final   Special Requests NONE  Final   Sputum evaluation   Final    THIS SPECIMEN IS ACCEPTABLE. RESPIRATORY CULTURE REPORT TO FOLLOW.   Report Status 11/03/2014 FINAL  Final     Scheduled Meds: . acyclovir  800 mg Oral BID  . aspirin EC  325 mg Oral Daily  . azithromycin  500 mg Oral Q24H  . calcium gluconate  1 tablet Oral Daily   . cefTRIAXone (ROCEPHIN)  IV  1 g Intravenous Q24H  . chlorhexidine  15 mL Mouth Rinse BID  . dextromethorphan-guaiFENesin  1 tablet Oral BID  . enoxaparin (LOVENOX) injection  40 mg Subcutaneous QHS  . fentaNYL  25 mcg Transdermal Q72H  . gabapentin  600 mg Oral BID  . ipratropium-albuterol  3 mL Nebulization TID  . lenalidomide  10 mg Oral Daily  . oseltamivir  75 mg Oral BID   Continuous Infusions: . sodium chloride 20 mL/hr at 11/04/14 1216

## 2014-11-05 NOTE — Progress Notes (Signed)
Pt. Alert and oriented x3 afebrile during shift no c/o pain, continues to refuse acyclovir, gapapentin last night per pt. He states that he was told the medicine decreases his blood count. No other complaints, pt. In no distress will continue to monitor.

## 2014-11-06 DIAGNOSIS — D849 Immunodeficiency, unspecified: Secondary | ICD-10-CM

## 2014-11-06 DIAGNOSIS — E860 Dehydration: Secondary | ICD-10-CM

## 2014-11-06 LAB — CULTURE, RESPIRATORY W GRAM STAIN

## 2014-11-06 LAB — CULTURE, RESPIRATORY: Culture: NORMAL

## 2014-11-06 MED ORDER — METOCLOPRAMIDE HCL 5 MG PO TABS
5.0000 mg | ORAL_TABLET | Freq: Three times a day (TID) | ORAL | Status: DC
  Administered 2014-11-06 – 2014-11-07 (×2): 5 mg via ORAL
  Filled 2014-11-06 (×5): qty 1

## 2014-11-06 MED ORDER — LEVOFLOXACIN 750 MG PO TABS
750.0000 mg | ORAL_TABLET | Freq: Every day | ORAL | Status: DC
  Administered 2014-11-06 – 2014-11-07 (×2): 750 mg via ORAL
  Filled 2014-11-06 (×2): qty 1

## 2014-11-06 NOTE — Progress Notes (Signed)
Patient ID: Mark [name], male   DOB: 01/11/1971, 44 y.o.   MRN: [id number] [facility] PROGRESS NOTE  Irving Bloor [id number]:[id number] DOB: [birth year] DOA: 11/02/2014 PCP: No PCP Per Patient  Brief narrative:    59 -year-old male with past medical history multiple myeloma, status post chemotherapy in remission until recently when he was started back on chemotherapy 10/31/2014. Patient presented to [facility] long ED with complaints of sore throat, headaches, nausea, generalized malaise for past couple of days prior to this admission. Patient reported fevers as high as 104.536F. He used ibuprofen with minimal symptomatic relief. Patient also reported intermittent nausea and nonbloody vomiting. He had poor oral intake for the last couple of days. He was given Augmentin by Dr. Vicente Males for about a couple of days ago but his symptoms did not significantly improve. Of note, he did have positive influenza A on PCR 11/01/2014. He is on Tamiflu.  On admission, patient had fever of 102.36F, he was tachycardic and tachypnea, oxygen saturation 89% on room air. Blood work showed hemoglobin of 12.3, platelets 127, creatinine 1.47. Chest x-ray on the admission showed a developing patchy nodular perihilar infiltration in the lungs likely reflecting early infectious process. He was started on azithromycin and Rocephin.  Assessment/Plan:    Principal problem: Acute respiratory failure with hypoxia / lobar pneumonia / influenza pneumonia -Respiratory status remains stable. -will switch antibiotics to PO (using levaquin) and will continue Tamiflu -Chest x-ray on admission showed nodular perihilar infiltration concerning for pneumonia -So far strep pneumonia and Legionella antigen are negative. Blood cultures are negative.  -continue supportive care and symptomatic treatment -will start reglan TID to help with N/V -hopefully home in 24 hours  Sepsis secondary to lobar pneumonia and influenza pneumonitis - Sepsis criteria  met on the admission with tachycardia, tachypnea, fever, hypoxia. Evidence of infection seen on chest x-ray in addition to influenza panel positive for influenza A, H1 N1.  -Lactic acid within normal limits. -Continue azithromycin and Rocephin. Continue Tamiflu. -Blood cultures so far w/o growth up to date. -plan is to transition to PO levaquin once tolerating PO's better  Multiple myeloma -Per Dr. Marin Olp discussion with patient ok to hold on acyclovir regimen and Revlemid -will follow rec's  Anemia of chronic disease - Secondary to history multiple myeloma, chemotherapy - Hemoglobin is stable at 8.7. No reports of bleeding. No current indications for transfusion. - Continue to monitor CBC intermittently   Thrombocytopenia - Secondary to history of malignancy and chemotherapy - Platelets 115 on 11/04/2014.  -no signs of acute bleeding; if platelets drops below 95,000 will d/c lovenox -will check CBC in am  Acute renal failure - Likely secondary to decrease perfusion with sepsis - Resolved with IV fluids. -Will check BMET in am   N/V -associated with chemotherapy and ongoing PNA -will use schedule antiemetics and see how he responds  DVT Prophylaxis  - Lovenox subcutaneous ordered  Code Status: Full.  Family Communication:  Family not at the bedside Disposition Plan: Home likely in next 24-48 hours if patient able to tolerate PO's properly.   IV access:  Peripheral IV  Procedures and diagnostic studies:    Dg Chest 2 View 11/02/2014  Developing patchy nodular perihilar infiltration in the lungs probably indicating developing inflammatory or infectious process.   Electronically Signed   By: Lucienne Capers M.D.   On: 11/02/2014 22:36   Dg Chest 2 View 11/01/2014  No acute abnormality seen.   Electronically Signed   By: Linus Mako.D.  On: 11/01/2014 07:31   Medical Consultants:  None   Other Consultants:  None   IAnti-Infectives:   Azithromycin 11/03/2014  -->4/7 Rocephin 11/03/2014 -->4/7 Tamiflu 11/03/2014 --> levaquin 4/7>>   Barton Dubois, MD  [facility] Pager [phone]  If 7PM-7AM, please contact night-coverage [email or web address] Password [facility] 11/06/2014, 12:14 PM   LOS: 4 days    HPI/Subjective: Patient w/o fever, no CP and no SOB. Feeling slightly better; but still struggling with PO intake and PO meds due to N/V.  Objective: Filed Vitals:   11/05/14 1449 11/05/14 2021 11/05/14 2156 11/06/14 0523  BP: 123/66  133/96 122/72  Pulse: 70  77 68  Temp: 98.4 F (36.9 C)  98.3 F (36.8 C) 98.5 F (36.9 C)  TempSrc: Oral  Oral Oral  Resp: '20  18 18  ' Height:      Weight:      SpO2: 99% 98% 100% 99%    Intake/Output Summary (Last 24 hours) at 11/06/14 1214 Last data filed at 11/06/14 0812  Gross per 24 hour  Intake    480 ml  Output      0 ml  Net    480 ml    Exam:   General:  Pt is alert, awake and oriented X 3; no fever. Denies CP or SOB. Continue to be nauseated and having trouble eating, drinking and taking meds; all due to N/V  Cardiovascular: Regular rate and rhythm, S1/S2 (+)  Respiratory: No wheezing, positive scattered rhonchi  Abdomen: Nondistended, nontender abdomen, appreciate bowel sounds  Extremities: No lower extremity swelling, palpable pulses  Neuro: Grossly nonfocal  Data Reviewed: Basic Metabolic Panel:  Recent Labs Lab 11/01/14 0020 11/02/14 2138 11/03/14 0430  NA 136 137 139  K 4.6 4.2 4.3  CL 97 101 107  CO2 '29 26 25  ' GLUCOSE 123* 153* 134*  BUN '14 13 9  ' CREATININE 1.47* 1.25 1.14  CALCIUM 9.3 8.9 8.2*  MG  --   --  2.0   Liver Function Tests:  Recent Labs Lab 11/01/14 0020 11/02/14 2138 11/03/14 0430  AST '24 26 21  ' ALT '18 19 18  ' ALKPHOS 61 60 54  BILITOT 1.4* 1.1 0.7  PROT 8.6* 7.8 7.5  ALBUMIN 4.7 4.3 3.7    Recent Labs Lab 11/02/14 2138  LIPASE 23   CBC:  Recent Labs Lab 11/01/14 0020 11/02/14 2138 11/03/14 0430 11/04/14 0928  WBC 6.7 5.3 5.4  4.0  NEUTROABS 5.1 4.4 4.8  --   HGB 12.3* 11.2* 9.9* 8.7*  HCT 36.8* 33.9* 29.7* 26.4*  MCV 95.6 96.0 95.8 96.0  PLT 127* 127* 115* 115*   Cardiac Enzymes:  Recent Labs Lab 11/02/14 2138  TROPONINI <0.03   CBG: No results for input(s): GLUCAP in the last 168 hours.  Rapid strep screen   (If patient has fever and/or without cough or runny nose)     Status: None   Collection Time: 11/01/14 12:08 AM  Result Value Ref Range Status   Streptococcus, Group A Screen (Direct) NEGATIVE NEGATIVE Final  Blood culture (routine x 2)     Status: None (Preliminary result)   Collection Time: 11/01/14  6:44 AM  Result Value Ref Range Status   Specimen Description BLOOD RIGHT ANTECUBITAL  Final   Special Requests BOTTLES DRAWN AEROBIC AND ANAEROBIC 5CC EACH  Final   Culture   Final           BLOOD CULTURE RECEIVED NO GROWTH TO DATE     Report Status  PENDING  Incomplete  Blood culture (routine x 2)     Status: None (Preliminary result)   Collection Time: 11/01/14  6:45 AM  Result Value Ref Range Status   Specimen Description BLOOD LEFT HAND  Final   Special Requests BOTTLES DRAWN AEROBIC AND ANAEROBIC 5CC EACH  Final   Culture   Final           BLOOD CULTURE RECEIVED NO GROWTH TO DATE     Report Status PENDING  Incomplete  Urine culture     Status: None   Collection Time: 11/01/14  7:05 AM  Result Value Ref Range Status   Specimen Description URINE, RANDOM  Final   Special Requests NONE  Final   Culture NO GROWTH Performed at [facility]   Final   Report Status 11/02/2014 FINAL  Final  Rapid strep screen     Status: None   Collection Time: 11/02/14  9:53 PM  Result Value Ref Range Status   Streptococcus, Group A Screen (Direct) NEGATIVE NEGATIVE Final  MRSA PCR Screening     Status: None   Collection Time: 11/03/14 12:29 AM  Result Value Ref Range Status   MRSA by PCR NEGATIVE NEGATIVE Final  Culture, sputum-assessment     Status: None   Collection Time: 11/03/14  4:45  AM  Result Value Ref Range Status   Specimen Description SPUTUM  Final   Special Requests NONE  Final   Sputum evaluation   Final    THIS SPECIMEN IS ACCEPTABLE. RESPIRATORY CULTURE REPORT TO FOLLOW.   Report Status 11/03/2014 FINAL  Final     Scheduled Meds: . acyclovir  800 mg Oral BID  . aspirin EC  325 mg Oral Daily  . azithromycin  500 mg Oral Q24H  . calcium gluconate  1 tablet Oral Daily  . cefTRIAXone (ROCEPHIN)  IV  1 g Intravenous Q24H  . chlorhexidine  15 mL Mouth Rinse BID  . dextromethorphan-guaiFENesin  1 tablet Oral BID  . enoxaparin (LOVENOX) injection  40 mg Subcutaneous QHS  . fentaNYL  25 mcg Transdermal Q72H  . gabapentin  600 mg Oral BID  . ipratropium-albuterol  3 mL Nebulization TID  . lenalidomide  10 mg Oral Daily  . oseltamivir  75 mg Oral BID   Continuous Infusions: . sodium chloride 20 mL/hr at 11/04/14 1216

## 2014-11-07 LAB — BASIC METABOLIC PANEL
Anion gap: 8 (ref 5–15)
BUN: 9 mg/dL (ref 6–23)
CALCIUM: 8.7 mg/dL (ref 8.4–10.5)
CO2: 26 mmol/L (ref 19–32)
Chloride: 104 mmol/L (ref 96–112)
Creatinine, Ser: 1.13 mg/dL (ref 0.50–1.35)
GFR calc Af Amer: >90 mL/min (ref >90)
GFR, EST NON AFRICAN AMERICAN: 79 mL/min — AB (ref >90)
Glucose, Bld: 92 mg/dL (ref 70–99)
Potassium: 3.9 mmol/L (ref 3.5–5.1)
SODIUM: 138 mmol/L (ref 135–145)

## 2014-11-07 LAB — CULTURE, BLOOD (ROUTINE X 2)
Culture: NO GROWTH
Culture: NO GROWTH

## 2014-11-07 LAB — CBC
HCT: 30.6 % — ABNORMAL LOW (ref 39.0–52.0)
Hemoglobin: 10.5 g/dL — ABNORMAL LOW (ref 13.0–17.0)
MCH: 31.7 pg (ref 26.0–34.0)
MCHC: 34.3 g/dL (ref 30.0–36.0)
MCV: 92.4 fL (ref 78.0–100.0)
PLATELETS: 165 10*3/uL (ref 150–400)
RBC: 3.31 MIL/uL — AB (ref 4.22–5.81)
RDW: 13.3 % (ref 11.5–15.5)
WBC: 3.2 10*3/uL — AB (ref 4.0–10.5)

## 2014-11-07 MED ORDER — HEPARIN SOD (PORK) LOCK FLUSH 100 UNIT/ML IV SOLN
500.0000 [IU] | INTRAVENOUS | Status: AC | PRN
  Administered 2014-11-07: 500 [IU]

## 2014-11-07 MED ORDER — PANTOPRAZOLE SODIUM 40 MG PO TBEC: 40.0000 mg | DELAYED_RELEASE_TABLET | Freq: Every day | ORAL | Status: DC

## 2014-11-07 MED ORDER — METOCLOPRAMIDE HCL 5 MG PO TABS: 5.0000 mg | ORAL_TABLET | Freq: Three times a day (TID) | ORAL | Status: DC

## 2014-11-07 MED ORDER — LEVOFLOXACIN 750 MG PO TABS: 750.0000 mg | ORAL_TABLET | Freq: Every day | ORAL | Status: DC

## 2014-11-07 MED ORDER — ONDANSETRON 8 MG PO TBDP: 8.0000 mg | ORAL_TABLET | Freq: Three times a day (TID) | ORAL | Status: DC | PRN

## 2014-11-07 NOTE — Progress Notes (Addendum)
Pt VSS. IV team deaccessed chest port. D/C information reviewed and given prescriptions, no further questions. Walked w/ pt to [facility] entrance, significant other picked up pt.  Kizzie Ide, RN

## 2014-11-07 NOTE — Discharge Summary (Signed)
Physician Discharge Summary  Mark [name] GNO:[id number] DOB: 1971/03/21 DOA: 11/02/2014  PCP: No PCP Per Patient  Admit date: 11/02/2014 Discharge date: 11/07/2014  Time spent: >30 minutes  Recommendations for Outpatient Follow-up:  1. Repeat CBC to follow Hgb and platelets trend 2. Repeat BMET to follow electrolytes and renal function 3. CXR in 4 weeks to be repeated to ensure resolution of infiltrate  Discharge Diagnoses:  Principal Problem:   Influenza A (H1N1) Active Problems:   Nausea with vomiting   Multiple myeloma   Dehydration   Back pain   Sepsis   CAP (community acquired pneumonia)   Influenza A   Blood poisoning   Immunocompromised   Sepsis due to pneumonia   Lobar pneumonia   Discharge Condition: stable and improved. Will discharged home with instructions to follow with PCP in 10 days.  Filed Weights   11/02/14 2318  Weight: 96.1 kg (211 lb 13.8 oz)    History of present illness:  44 -year-old male with past medical history multiple myeloma, status post chemotherapy in remission until recently when he was started back on chemotherapy 10/31/2014. Patient presented to [facility] long ED with complaints of sore throat, headaches, nausea, generalized malaise for past couple of days prior to this admission. Patient reported fevers as high as 104.20F. He used ibuprofen with minimal symptomatic relief. Patient also reported intermittent nausea and nonbloody vomiting. He had poor oral intake for the last couple of days. He was given Augmentin by Dr. Vicente Males for about a couple of days ago but his symptoms did not significantly improve. Of note, he did have positive influenza A on PCR 11/01/2014.   Hospital Course:  Acute respiratory failure with hypoxia / lobar pneumonia / influenza pneumonia -Respiratory status remains stable; no SOB and good O2 sat on RA -will discharge on levaquin to complete antibiotic therapy for PNA -patient completed 5 days of Tamiflu while inpatient as  part of influenza treatment. -Strep pneumonia and Legionella antigen are negative. Blood cultures are negative.  -will discharge home and patient will follow with PCP/oncologist in 10 days  Sepsis secondary to lobar pneumonia and influenza pneumonitis -Sepsis criteria met on the admission with tachycardia, tachypnea, fever, hypoxia. Evidence of infection seen on chest x-ray in addition to influenza panel positive for influenza A, H1 N1.  -Lactic acid within normal limits at discharge -patient completed therapy with tamiflu and would be discharged on levaquin to complete antibiotic therapy -Blood cultures so far w/o growth up to date. -no fever and normal WBC's; patient improved and feeling a lot better.  Multiple myeloma -Per Dr. Marin Olp discussion with patient ok to hold on acyclovir regimen and Revlemid -will follow his rec's; patient to follow at his office in outpatient setting  Anemia of chronic disease - Secondary to history multiple myeloma, chemotherapy - Hemoglobin stable an improved 10.5 at discharge. No reports of bleeding.  -No current indications for transfusion. - CBC to be rechecked during follow up visit  Thrombocytopenia -Secondary to history of malignancy, viral infection and chemotherapy -Platelets 165 at discharge.  -Will follow platelets count during outpatient follow up visit  Acute renal failure - Likely secondary to decrease perfusion with sepsis and lack of intake with ongoing N/V - Resolved with IV fluids. -BMET to be follow during follow up visit  N/V -associated with chemotherapy and ongoing PNA/influenza -will discharge with short course of reglan and continue PRN antiemetics  Procedures:  See below for x-ray reports   Consultations:  Oncology service  Discharge Exam:  Filed Vitals:   11/07/14 0421  BP: 124/74  Pulse: 69  Temp: 98.5 F (36.9 C)  Resp: 16    General: Pt is alert, awake and oriented X 3; no fever. Denies CP or SOB.  Feeling much better and now able to tolerate PO meds and diet.  Cardiovascular: Regular rate and rhythm, S1/S2 appreciated on exam. No JVD  Respiratory: No wheezing, no crackles and just mild scattered rhonchi  Abdomen: Nondistended, nontender abdomen, appreciate bowel sounds  Extremities: No lower extremity swelling, palpable pulses  Neuro: Grossly nonfocal   Discharge Instructions   Discharge Instructions    Discharge instructions    Complete by:  As directed   Take medications as prescribed Keep yourself well hydrated Arrange follow up with PCP in 10 days          Current Discharge Medication List    START taking these medications   Details  levofloxacin (LEVAQUIN) 750 MG tablet Take 1 tablet (750 mg total) by mouth daily. Qty: 3 tablet, Refills: 0    metoCLOPramide (REGLAN) 5 MG tablet Take 1 tablet (5 mg total) by mouth 3 (three) times daily before meals. Qty: 45 tablet, Refills: 0    pantoprazole (PROTONIX) 40 MG tablet Take 1 tablet (40 mg total) by mouth daily. Qty: 30 tablet, Refills: 1      CONTINUE these medications which have CHANGED   Details  ondansetron (ZOFRAN ODT) 8 MG disintegrating tablet Take 1 tablet (8 mg total) by mouth every 8 (eight) hours as needed for nausea or vomiting. Qty: 20 tablet, Refills: 0      CONTINUE these medications which have NOT CHANGED   Details  acetaminophen (TYLENOL) 500 MG tablet Take 500 mg by mouth every 6 (six) hours as needed for moderate pain or headache.    acyclovir (ZOVIRAX) 800 MG tablet Take 1 tablet (800 mg total) by mouth 2 (two) times daily. Qty: 60 tablet, Refills: 0   Associated Diagnoses: Myeloma    aspirin EC 325 MG tablet Take 325 mg by mouth daily.    bisacodyl (DULCOLAX) 5 MG EC tablet Take 5 mg by mouth daily as needed for moderate constipation.    calcium gluconate 500 MG tablet Take 1 tablet by mouth daily.    fentaNYL (DURAGESIC - DOSED MCG/HR) 25 MCG/HR patch Place 1 patch (25 mcg total)  onto the skin every 3 (three) days. Qty: 10 patch, Refills: 0   Associated Diagnoses: Multiple myeloma    gabapentin (NEURONTIN) 300 MG capsule Take 300-600 mg by mouth 2 (two) times daily. 369m in the morning and 6068min the evening Refills: 3    lenalidomide (REVLIMID) 10 MG capsule Take 1 capsule (10 mg total) by mouth daily. AuJosem Kaufmann4#[id number]: 28 capsule, Refills: 0   Associated Diagnoses: Multiple myeloma    oxyCODONE-acetaminophen (PERCOCET) 10-325 MG per tablet Take 1 tablet by mouth every 4 (four) hours as needed for pain. Qty: 120 tablet, Refills: 0   Associated Diagnoses: Spinal stenosis of lumbar region; Multiple myeloma    prochlorperazine (COMPAZINE) 10 MG tablet Take 10 mg by mouth every 8 (eight) hours as needed for nausea or vomiting.   Associated Diagnoses: Multiple myeloma      STOP taking these medications     amoxicillin-clavulanate (AUGMENTIN) 875-125 MG per tablet        No Known Allergies Follow-up Information    Follow up with ENVolanda NapoleonMD.   Specialty:  Oncology   Why:  office to confirmed follow  up appointment   Contact information:   [place], [place] [address] [phone]       The results of significant diagnostics from this hospitalization (including imaging, microbiology, ancillary and laboratory) are listed below for reference.    Significant Diagnostic Studies: Dg Chest 2 View  11/02/2014   CLINICAL DATA:  Left lower chest pain, sore throat, nausea, headache, body aches, and fever since Wednesday.  EXAM: CHEST  2 VIEW  COMPARISON:  11/01/2014  FINDINGS: Power port type central venous catheter is unchanged in position. There is evidence of developing patchy nodular infiltration in the perihilar region of both lungs, greater on the right. This suggest possible developing inflammatory process, given clinical history. Old bilateral rib fractures are present. No blunting of costophrenic angles. No pneumothorax.  Heart size and pulmonary vascularity are normal. Degenerative changes in the left shoulder.  IMPRESSION: Developing patchy nodular perihilar infiltration in the lungs probably indicating developing inflammatory or infectious process.   Electronically Signed   By: Lucienne Capers M.D.   On: 11/02/2014 22:36   Dg Chest 2 View  11/01/2014   CLINICAL DATA:  Fever and cough, history of multiple myeloma  EXAM: CHEST  2 VIEW  COMPARISON:  05/07/2014  FINDINGS: Cardiac shadow is within normal limits. A right-sided chest wall port is again seen and stable. The lungs are well aerated bilaterally. No focal infiltrate or sizable effusion is seen. Changes of prior vertebral augmentation are noted at T12. No acute bony abnormality is noted.  IMPRESSION: No acute abnormality seen.   Electronically Signed   By: Inez Catalina M.D.   On: 11/01/2014 07:31   Mr Lumbar Spine W Wo Contrast  10/26/2014   CLINICAL DATA:  Spinal decompression surgery in February. Low back pain extending to the legs, worsening over the last week. Multiple myeloma.  EXAM: MRI LUMBAR SPINE WITHOUT AND WITH CONTRAST  TECHNIQUE: Multiplanar and multiecho pulse sequences of the lumbar spine were obtained without and with intravenous contrast.  CONTRAST:  7m MULTIHANCE GADOBENATE DIMEGLUMINE 529 MG/ML IV SOLN  COMPARISON:  MRI 08/28/2014. CT 09/04/2014. MRI 06/19/2014. MRI 02/26/2014. Multiple previous studies.  FINDINGS: Since the previous studies, no change is seen at L4 or above. Previously augmented pathologic fracture at T12 without evidence of tumor growth or neural compression. Chronic inferior endplate Schmorl's node at L1 in tiny superior endplate Schmorl's node at L2 are unchanged previously seen small foci of T2 marrow signal continue to become less evident. This is consistent with myeloma response to treatment. Tiny focus of enhancement in the posterior superior corner of L4 on the right is becoming less apparent. Advanced fatty marrow changes  at L4, L5 and S1 consistent with previous radiation.  At the L5 level, the patient has undergone wide posterior decompression. This includes resection of part of the spinous process of L4, and of the laminae the and spinous process of L5. There is a fluid collection in this space measuring 4.3 cm cephalocaudal, 4 cm front to back and 3 cm right to left. There are multiple internal septations. This could be resolving postoperative hematoma/ seroma, but CSF leak is not excluded. This collection shows peripheral contrast enhancement.  Chronic bony changes affecting the right side of the L5 vertebral body, the right pedicle in the posterior elements on the right are unchanged, consistent with treated myeloma in this location. Bony changes are cystic without solid internal enhancement to indicate residual or recurrent viable tumor. Small marrow focus in the right  iliac bone is unchanged and also inactive. There is continued foraminal narrowing on the right at L5-S1 that could affect the exiting L5 nerve root. The foramen on the left is mildly narrowed but left-sided neural compression is not demonstrated.  IMPRESSION: Interval posterior decompression at L5. Fluid collection in the postoperative space that could be resolving seroma/hematoma shows septation and peripheral enhancement. CSF leak is not excluded based on this imaging appearance.  Chronic treated bone changes on the right at L5 are unchanged and continue to appear "inactive" consistent with treated tumor. Foraminal stenosis on the right continues to have potential to affect/compress the exiting right L5 nerve root.   Electronically Signed   By: Nelson Chimes M.D.   On: 10/26/2014 20:32    Microbiology: Recent Results (from the past 240 hour(s))  Rapid strep screen   (If patient has fever and/or without cough or runny nose)     Status: None   Collection Time: 11/01/14 12:08 AM  Result Value Ref Range Status   Streptococcus, Group A Screen (Direct)  NEGATIVE NEGATIVE Final    Comment: (NOTE) A Rapid Antigen test may result negative if the antigen level in the sample is below the detection level of this test. The FDA has not cleared this test as a stand-alone test therefore the rapid antigen negative result has reflexed to a Group A Strep culture.   Culture, Group A Strep     Status: None   Collection Time: 11/01/14 12:08 AM  Result Value Ref Range Status   Strep A Culture Negative  Final    Comment: (NOTE) Performed At: [facility] [place], [place] [id number] Lindon Romp MD BR:[id number]   Blood culture (routine x 2)     Status: None   Collection Time: 11/01/14  6:44 AM  Result Value Ref Range Status   Specimen Description BLOOD RIGHT ANTECUBITAL  Final   Special Requests BOTTLES DRAWN AEROBIC AND ANAEROBIC 5CC EACH  Final   Culture   Final    NO GROWTH 5 DAYS Performed at [facility]    Report Status 11/07/2014 FINAL  Final  Blood culture (routine x 2)     Status: None   Collection Time: 11/01/14  6:45 AM  Result Value Ref Range Status   Specimen Description BLOOD LEFT HAND  Final   Special Requests BOTTLES DRAWN AEROBIC AND ANAEROBIC 5CC EACH  Final   Culture   Final    NO GROWTH 5 DAYS Performed at [facility]    Report Status 11/07/2014 FINAL  Final  Urine culture     Status: None   Collection Time: 11/01/14  7:05 AM  Result Value Ref Range Status   Specimen Description URINE, RANDOM  Final   Special Requests NONE  Final   Colony Count NO GROWTH Performed at [facility]   Final   Culture NO GROWTH Performed at [facility]   Final   Report Status 11/02/2014 FINAL  Final  Rapid strep screen     Status: None   Collection Time: 11/02/14  9:53 PM  Result Value Ref Range Status   Streptococcus, Group A Screen (Direct) NEGATIVE NEGATIVE Final    Comment: (NOTE) A Rapid Antigen test may result negative if the antigen level in the sample is below  the detection level of this test. The FDA has not cleared this test as a stand-alone test therefore the rapid antigen negative result has reflexed to a Group A Strep  culture.   Culture, Group A Strep     Status: None   Collection Time: 11/02/14  9:53 PM  Result Value Ref Range Status   Strep A Culture Negative  Final    Comment: (NOTE) Performed At: [facility] [place], [place] [id number] Lindon Romp MD HB:[id number]   Blood Culture (routine x 2)     Status: None (Preliminary result)   Collection Time: 11/02/14 10:14 PM  Result Value Ref Range Status   Specimen Description BLOOD PAC  Final   Special Requests BOTTLES DRAWN AEROBIC AND ANAEROBIC 6ML  Final   Culture   Final           BLOOD CULTURE RECEIVED NO GROWTH TO DATE CULTURE WILL BE HELD FOR 5 DAYS BEFORE ISSUING A FINAL NEGATIVE REPORT Performed at [facility]    Report Status PENDING  Incomplete  Blood Culture (routine x 2)     Status: None (Preliminary result)   Collection Time: 11/02/14 10:40 PM  Result Value Ref Range Status   Specimen Description BLOOD RIGHT ANTECUBITAL  Final   Special Requests BOTTLES DRAWN AEROBIC AND ANAEROBIC 3CC  Final   Culture   Final           BLOOD CULTURE RECEIVED NO GROWTH TO DATE CULTURE WILL BE HELD FOR 5 DAYS BEFORE ISSUING A FINAL NEGATIVE REPORT Performed at [facility]    Report Status PENDING  Incomplete  Urine culture     Status: None   Collection Time: 11/02/14 11:11 PM  Result Value Ref Range Status   Specimen Description URINE, CLEAN CATCH  Final   Special Requests NONE  Final   Colony Count NO GROWTH Performed at [facility]   Final   Culture NO GROWTH Performed at [facility]   Final   Report Status 11/04/2014 FINAL  Final  MRSA PCR Screening     Status: None   Collection Time: 11/03/14 12:29 AM  Result Value Ref Range Status   MRSA by PCR NEGATIVE NEGATIVE Final    Comment:        The GeneXpert MRSA  Assay (FDA approved for NASAL specimens only), is one component of a comprehensive MRSA colonization surveillance program. It is not intended to diagnose MRSA infection nor to guide or monitor treatment for MRSA infections.   Culture, sputum-assessment     Status: None   Collection Time: 11/03/14  4:45 AM  Result Value Ref Range Status   Specimen Description SPUTUM  Final   Special Requests NONE  Final   Sputum evaluation   Final    THIS SPECIMEN IS ACCEPTABLE. RESPIRATORY CULTURE REPORT TO FOLLOW.   Report Status 11/03/2014 FINAL  Final  Culture, respiratory (NON-Expectorated)     Status: None   Collection Time: 11/03/14  4:45 AM  Result Value Ref Range Status   Specimen Description SPUTUM  Final   Special Requests NONE  Final   Gram Stain   Final    ABUNDANT WBC PRESENT,BOTH PMN AND MONONUCLEAR FEW SQUAMOUS EPITHELIAL CELLS PRESENT MODERATE GRAM POSITIVE COCCI IN PAIRS MODERATE GRAM NEGATIVE RODS Performed at [facility]    Culture   Final    NORMAL OROPHARYNGEAL FLORA Performed at [facility]    Report Status 11/06/2014 FINAL  Final  Respiratory virus panel     Status: Abnormal   Collection Time: 11/03/14  4:48 AM  Result Value Ref Range Status   Respiratory Syncytial Virus A Negative  Negative Final   Respiratory Syncytial Virus B Negative Negative Final   Influenza A Positive (A) Negative Final    Comment: Positive for Influenza A 2009 H1N1   Influenza B Negative Negative Final   Parainfluenza 1 Negative Negative Final   Parainfluenza 2 Negative Negative Final   Parainfluenza 3 Negative Negative Final   Metapneumovirus Negative Negative Final   Rhinovirus Negative Negative Final   Adenovirus Negative Negative Final    Comment: (NOTE) Performed At: [facility] [address] [place], [place] [id number] Lindon Romp MD NF:[id number]      Labs: Basic Metabolic Panel:  Recent Labs Lab 11/01/14 0020 11/02/14 2138  11/03/14 0430 11/07/14 0600  NA 136 137 139 138  K 4.6 4.2 4.3 3.9  CL 97 101 107 104  CO2 '29 26 25 26  ' GLUCOSE 123* 153* 134* 92  BUN '14 13 9 9  ' CREATININE 1.47* 1.25 1.14 1.13  CALCIUM 9.3 8.9 8.2* 8.7  MG  --   --  2.0  --    Liver Function Tests:  Recent Labs Lab 11/01/14 0020 11/02/14 2138 11/03/14 0430  AST '24 26 21  ' ALT '18 19 18  ' ALKPHOS 61 60 54  BILITOT 1.4* 1.1 0.7  PROT 8.6* 7.8 7.5  ALBUMIN 4.7 4.3 3.7    Recent Labs Lab 11/02/14 2138  LIPASE 23   CBC:  Recent Labs Lab 11/01/14 0020 11/02/14 2138 11/03/14 0430 11/04/14 0928 11/07/14 0600  WBC 6.7 5.3 5.4 4.0 3.2*  NEUTROABS 5.1 4.4 4.8  --   --   HGB 12.3* 11.2* 9.9* 8.7* 10.5*  HCT 36.8* 33.9* 29.7* 26.4* 30.6*  MCV 95.6 96.0 95.8 96.0 92.4  PLT 127* 127* 115* 115* 165   Cardiac Enzymes:  Recent Labs Lab 11/02/14 2138  TROPONINI <0.03    Signed:  Barton Dubois  [facility] 11/07/2014, 2:48 PM

## 2014-11-09 LAB — CULTURE, BLOOD (ROUTINE X 2)
Culture: NO GROWTH
Culture: NO GROWTH

## 2014-11-18 ENCOUNTER — Other Ambulatory Visit: Payer: BLUE CROSS/BLUE SHIELD

## 2014-11-18 ENCOUNTER — Ambulatory Visit: Payer: BLUE CROSS/BLUE SHIELD

## 2014-11-18 ENCOUNTER — Telehealth: Payer: Self-pay | Admitting: Hematology & Oncology

## 2014-11-18 ENCOUNTER — Ambulatory Visit: Payer: BLUE CROSS/BLUE SHIELD | Admitting: Family

## 2014-11-18 NOTE — Telephone Encounter (Signed)
Patient walked in office thinking his apt was at 2:45.[facility] Apt was at 9:15.  Per Rn to resch apt.  Patient resch apt for 11/20/14

## 2014-11-20 ENCOUNTER — Ambulatory Visit (HOSPITAL_BASED_OUTPATIENT_CLINIC_OR_DEPARTMENT_OTHER): Payer: BLUE CROSS/BLUE SHIELD

## 2014-11-20 ENCOUNTER — Ambulatory Visit (HOSPITAL_BASED_OUTPATIENT_CLINIC_OR_DEPARTMENT_OTHER): Payer: BLUE CROSS/BLUE SHIELD | Admitting: Family

## 2014-11-20 ENCOUNTER — Encounter: Payer: Self-pay | Admitting: Family

## 2014-11-20 VITALS — BP 117/84 | HR 100 | Temp 98.4°F | Resp 18 | Ht 71.0 in | Wt 206.0 lb

## 2014-11-20 DIAGNOSIS — C9000 Multiple myeloma not having achieved remission: Secondary | ICD-10-CM | POA: Diagnosis not present

## 2014-11-20 DIAGNOSIS — Z5112 Encounter for antineoplastic immunotherapy: Secondary | ICD-10-CM

## 2014-11-20 LAB — CMP (CANCER CENTER ONLY)
ALK PHOS: 58 U/L (ref 26–84)
ALT(SGPT): 77 U/L — ABNORMAL HIGH (ref 10–47)
AST: 40 U/L — AB (ref 11–38)
Albumin: 4.4 g/dL (ref 3.3–5.5)
BUN: 19 mg/dL (ref 7–22)
CHLORIDE: 102 meq/L (ref 98–108)
CO2: 31 mEq/L (ref 18–33)
CREATININE: 1.3 mg/dL — AB (ref 0.6–1.2)
Calcium: 10 mg/dL (ref 8.0–10.3)
Glucose, Bld: 122 mg/dL — ABNORMAL HIGH (ref 73–118)
POTASSIUM: 4.1 meq/L (ref 3.3–4.7)
SODIUM: 142 meq/L (ref 128–145)
Total Bilirubin: 1.5 mg/dl (ref 0.20–1.60)
Total Protein: 8.2 g/dL — ABNORMAL HIGH (ref 6.4–8.1)

## 2014-11-20 LAB — CBC WITH DIFFERENTIAL (CANCER CENTER ONLY)
BASO#: 0 10*3/uL (ref 0.0–0.2)
BASO%: 0.3 % (ref 0.0–2.0)
EOS ABS: 0.2 10*3/uL (ref 0.0–0.5)
EOS%: 4.1 % (ref 0.0–7.0)
HCT: 34.7 % — ABNORMAL LOW (ref 38.7–49.9)
HEMOGLOBIN: 11.7 g/dL — AB (ref 13.0–17.1)
LYMPH#: 1.2 10*3/uL (ref 0.9–3.3)
LYMPH%: 31 % (ref 14.0–48.0)
MCH: 32.2 pg (ref 28.0–33.4)
MCHC: 33.7 g/dL (ref 32.0–35.9)
MCV: 96 fL (ref 82–98)
MONO#: 0.5 10*3/uL (ref 0.1–0.9)
MONO%: 12.8 % (ref 0.0–13.0)
NEUT#: 2 10*3/uL (ref 1.5–6.5)
NEUT%: 51.8 % (ref 40.0–80.0)
PLATELETS: 158 10*3/uL (ref 145–400)
RBC: 3.63 10*6/uL — ABNORMAL LOW (ref 4.20–5.70)
RDW: 13.7 % (ref 11.1–15.7)
WBC: 3.9 10*3/uL — AB (ref 4.0–10.0)

## 2014-11-20 MED ORDER — ONDANSETRON HCL 8 MG PO TABS
8.0000 mg | ORAL_TABLET | Freq: Once | ORAL | Status: AC
  Administered 2014-11-20: 8 mg via ORAL

## 2014-11-20 MED ORDER — GABAPENTIN 300 MG PO CAPS: 600.0000 mg | ORAL_CAPSULE | Freq: Three times a day (TID) | ORAL | Status: DC

## 2014-11-20 MED ORDER — BORTEZOMIB CHEMO SQ INJECTION 3.5 MG (2.5MG/ML)
1.3000 mg/m2 | Freq: Once | INTRAMUSCULAR | Status: AC
  Administered 2014-11-20: 2.75 mg via SUBCUTANEOUS
  Filled 2014-11-20: qty 2.75

## 2014-11-20 MED ORDER — FENTANYL 50 MCG/HR TD PT72: 50.0000 ug | MEDICATED_PATCH | TRANSDERMAL | Status: DC

## 2014-11-20 MED ORDER — ONDANSETRON HCL 8 MG PO TABS
ORAL_TABLET | ORAL | Status: AC
  Filled 2014-11-20: qty 1

## 2014-11-20 NOTE — Progress Notes (Signed)
Hematology and Oncology Follow Up Visit  Mark [name] [id number] [birth year] 43 y.o. 11/20/2014   Principle Diagnosis:  Kappa light chain myeloma  Current Therapy:   Revlimid 10mg  po q day (21/7)  Zometa 4 mg IV q. month  Velcade q 3 week dosing    Interim History: Mark [name] is here today for follow-up. He is still having a lot of back pain since his laminectomy and decompression. He states that the numbness in his feet is worse and now he is having numbness in his right leg.  He is not getting any relief from the Duragesic patch. He states that he notified his surgeon of his continuous back pain and worsening numbness in his feet.  He finished his Tamiflu and has had no more viral symptoms. He deniesfever, chills, n/v, cough, blurred vision, constipation, diarrhea, rash, SOB, chest pain, palpitations, abdominal pain, problems urinating or blood in urine or stool.  No swelling in his extremities.  His appetite comes and goes. He is staying hydrated. His is down 5 lbs this visit.   In March, his kappa light chain was 2.57 mg/dl and M-spike was 0.23 g/dl.   Medications:    Medication List       This list is accurate as of: 11/20/14  1:28 PM.  Always use your most recent med list.               acetaminophen 500 MG tablet  Commonly known as:  TYLENOL  Take 500 mg by mouth every 6 (six) hours as needed for moderate pain or headache.     acyclovir 800 MG tablet  Commonly known as:  ZOVIRAX  Take 1 tablet (800 mg total) by mouth 2 (two) times daily.     aspirin EC 325 MG tablet  Take 325 mg by mouth daily.     bisacodyl 5 MG EC tablet  Commonly known as:  DULCOLAX  Take 5 mg by mouth daily as needed for moderate constipation.     calcium gluconate 500 MG tablet  Take 1 tablet by mouth daily.     fentaNYL 25 MCG/HR patch  Commonly known as:  DURAGESIC - dosed mcg/hr  Place 1 patch (25 mcg total) onto the skin every 3 (three) days.     gabapentin 300 MG capsule    Commonly known as:  NEURONTIN  Take 300-600 mg by mouth 2 (two) times daily. 300mg  in the morning and 600mg  in the evening     lenalidomide 10 MG capsule  Commonly known as:  REVLIMID  Take 1 capsule (10 mg total) by mouth daily. Auth #[id number]     levofloxacin 750 MG tablet  Commonly known as:  LEVAQUIN  Take 1 tablet (750 mg total) by mouth daily.     metoCLOPramide 5 MG tablet  Commonly known as:  REGLAN  Take 1 tablet (5 mg total) by mouth 3 (three) times daily before meals.     ondansetron 8 MG disintegrating tablet  Commonly known as:  ZOFRAN ODT  Take 1 tablet (8 mg total) by mouth every 8 (eight) hours as needed for nausea or vomiting.     oxyCODONE-acetaminophen 10-325 MG per tablet  Commonly known as:  PERCOCET  Take 1 tablet by mouth every 4 (four) hours as needed for pain.     pantoprazole 40 MG tablet  Commonly known as:  PROTONIX  Take 1 tablet (40 mg total) by mouth daily.     prochlorperazine 10 MG tablet  Commonly known  as:  COMPAZINE  Take 10 mg by mouth every 8 (eight) hours as needed for nausea or vomiting.        Allergies: No Known Allergies  Past Medical History, Surgical history, Social history, and Family History were reviewed and updated.  Review of Systems: All other 10 point review of systems is negative.   Physical Exam:  vitals were not taken for this visit.  Wt Readings from Last 3 Encounters:  11/02/14 211 lb 13.8 oz (96.1 kg)  10/28/14 215 lb (97.523 kg)  09/25/14 213 lb (96.616 kg)    Ocular: Sclerae unicteric, pupils equal, round and reactive to light Ear-nose-throat: Oropharynx clear, dentition fair Lymphatic: No cervical or supraclavicular adenopathy Lungs no rales or rhonchi, good excursion bilaterally Heart regular rate and rhythm, no murmur appreciated Abd soft, nontender, positive bowel sounds MSK no focal spinal tenderness, no joint edema Neuro: non-focal, well-oriented, appropriate affect  Lab Results  Component  Value Date   WBC 3.9* 11/20/2014   HGB 11.7* 11/20/2014   HCT 34.7* 11/20/2014   MCV 96 11/20/2014   PLT 158 11/20/2014   No results found for: FERRITIN, IRON, TIBC, UIBC, IRONPCTSAT Lab Results  Component Value Date   RBC 3.63* 11/20/2014   Lab Results  Component Value Date   KPAFRELGTCHN 2.57* 10/28/2014   LAMBDASER 1.72 10/28/2014   KAPLAMBRATIO 1.49 10/28/2014   Lab Results  Component Value Date   IGGSERUM 1210 10/28/2014   IGA 85 10/28/2014   IGMSERUM 28* 10/28/2014   Lab Results  Component Value Date   TOTALPROTELP 7.4 10/28/2014   ALBUMINELP 4.5 10/28/2014   A1GS 0.3 10/28/2014   A2GS 0.7 10/28/2014   BETS 0.4 10/28/2014   BETA2SER 0.4 10/28/2014   GAMS 1.2 10/28/2014   MSPIKE 0.23 09/22/2014   SPEI * 10/28/2014     Chemistry      Component Value Date/Time   NA 138 11/07/2014 0600   NA 144 10/28/2014 0932   NA 145 02/21/2013 0908   K 3.9 11/07/2014 0600   K 4.1 10/28/2014 0932   K 3.8 02/21/2013 0908   CL 104 11/07/2014 0600   CL 106 10/28/2014 0932   CO2 26 11/07/2014 0600   CO2 30 10/28/2014 0932   CO2 32* 02/21/2013 0908   BUN 9 11/07/2014 0600   BUN 17 10/28/2014 0932   BUN 19.5 02/21/2013 0908   CREATININE 1.13 11/07/2014 0600   CREATININE 1.3* 10/28/2014 0932   CREATININE 1.5* 02/21/2013 0908      Component Value Date/Time   CALCIUM 8.7 11/07/2014 0600   CALCIUM 9.8 10/28/2014 0932   CALCIUM 17.7 Repeated and Verified* 02/21/2013 0908   CALCIUM >15.0* 02/01/2013 1230   ALKPHOS 54 11/03/2014 0430   ALKPHOS 61 10/28/2014 0932   AST 21 11/03/2014 0430   AST 19 10/28/2014 0932   ALT 18 11/03/2014 0430   ALT 15 10/28/2014 0932   BILITOT 0.7 11/03/2014 0430   BILITOT 1.50 10/28/2014 0932     Impression and Plan: Mark [name] is 44 year old African American male with kappa light chain myeloma. 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 He states that he is no longer followed by [facility].    He is still experiencing a lot of back pain with no relief from the Duragesic patch. Numbness in feet is worse. I spoke with Dr. Marin Olp about this. We will schedule an MRI of the spine for next week.   We  will also increase his Duragesic patch to 50 mcg and Neurontin to 600mg  TID.  He will proceed with his Velcade today.  We will see him back in 3 weeks for labs and follow-up.  He know to call here with any questions or concerns. We can certainly see him sooner if need be.   [name], NP 4/21/20161:28 PM

## 2014-11-20 NOTE — Patient Instructions (Signed)
Bortezomib injection What is this medicine? BORTEZOMIB (bor TEZ oh mib) is a chemotherapy drug. It slows the growth of cancer cells. This medicine is used to treat multiple myeloma, and certain lymphomas, such as mantle-cell lymphoma. This medicine may be used for other purposes; ask your health care provider or pharmacist if you have questions. COMMON BRAND NAME(S): Velcade What should I tell my health care provider before I take this medicine? They need to know if you have any of these conditions: -diabetes -heart disease -irregular heartbeat -liver disease -on hemodialysis -low blood counts, like low white blood cells, platelets, or hemoglobin -peripheral neuropathy -taking medicine for blood pressure -an unusual or allergic reaction to bortezomib, mannitol, boron, other medicines, foods, dyes, or preservatives -pregnant or trying to get pregnant -breast-feeding How should I use this medicine? This medicine is for injection into a vein or for injection under the skin. It is given by a health care professional in a hospital or clinic setting. Talk to your pediatrician regarding the use of this medicine in children. Special care may be needed. Overdosage: If you think you have taken too much of this medicine contact a poison control center or emergency room at once. NOTE: This medicine is only for you. Do not share this medicine with others. What if I miss a dose? It is important not to miss your dose. Call your doctor or health care professional if you are unable to keep an appointment. What may interact with this medicine? This medicine may interact with the following medications: -ketoconazole -rifampin -ritonavir -St. John's Wort This list may not describe all possible interactions. Give your health care provider a list of all the medicines, herbs, non-prescription drugs, or dietary supplements you use. Also tell them if you smoke, drink alcohol, or use illegal drugs. Some items  may interact with your medicine. What should I watch for while using this medicine? Visit your doctor for checks on your progress. This drug may make you feel generally unwell. This is not uncommon, as chemotherapy can affect healthy cells as well as cancer cells. Report any side effects. Continue your course of treatment even though you feel ill unless your doctor tells you to stop. You may get drowsy or dizzy. Do not drive, use machinery, or do anything that needs mental alertness until you know how this medicine affects you. Do not stand or sit up quickly, especially if you are an older patient. This reduces the risk of dizzy or fainting spells. In some cases, you may be given additional medicines to help with side effects. Follow all directions for their use. Call your doctor or health care professional for advice if you get a fever, chills or sore throat, or other symptoms of a cold or flu. Do not treat yourself. This drug decreases your body's ability to fight infections. Try to avoid being around people who are sick. This medicine may increase your risk to bruise or bleed. Call your doctor or health care professional if you notice any unusual bleeding. You may need blood work done while you are taking this medicine. In some patients, this medicine may cause a serious brain infection that may cause death. If you have any problems seeing, thinking, speaking, walking, or standing, tell your doctor right away. If you cannot reach your doctor, urgently seek other source of medical care. Do not become pregnant while taking this medicine. Women should inform their doctor if they wish to become pregnant or think they might be pregnant. There is   a potential for serious side effects to an unborn child. Talk to your health care professional or pharmacist for more information. Do not breast-feed an infant while taking this medicine. Check with your doctor or health care professional if you get an attack of  severe diarrhea, nausea and vomiting, or if you sweat a lot. The loss of too much body fluid can make it dangerous for you to take this medicine. What side effects may I notice from receiving this medicine? Side effects that you should report to your doctor or health care professional as soon as possible: -allergic reactions like skin rash, itching or hives, swelling of the face, lips, or tongue -breathing problems -changes in hearing -changes in vision -fast, irregular heartbeat -feeling faint or lightheaded, falls -pain, tingling, numbness in the hands or feet -right upper belly pain -seizures -swelling of the ankles, feet, hands -unusual bleeding or bruising -unusually weak or tired -vomiting -yellowing of the eyes or skin Side effects that usually do not require medical attention (report to your doctor or health care professional if they continue or are bothersome): -changes in emotions or moods -constipation -diarrhea -loss of appetite -headache -irritation at site where injected -nausea This list may not describe all possible side effects. Call your doctor for medical advice about side effects. You may report side effects to FDA at 1-800-FDA-1088. Where should I keep my medicine? This drug is given in a hospital or clinic and will not be stored at home. NOTE: This sheet is a summary. It may not cover all possible information. If you have questions about this medicine, talk to your doctor, pharmacist, or health care provider.  2015, [employer]vier/Gold Standard. (2013-05-13 12:46:32)  

## 2014-11-20 NOTE — Progress Notes (Signed)
Will hold Zometa today and give at the 5 week interval.

## 2014-11-21 ENCOUNTER — Telehealth: Payer: Self-pay | Admitting: Hematology & Oncology

## 2014-11-21 NOTE — Telephone Encounter (Signed)
Mark [name] in scheduling will call to schedule MRI

## 2014-11-24 ENCOUNTER — Telehealth: Payer: Self-pay | Admitting: Hematology & Oncology

## 2014-11-24 LAB — KAPPA/LAMBDA LIGHT CHAINS
KAPPA LAMBDA RATIO: 1.86 — AB (ref 0.26–1.65)
Kappa free light chain: 2.79 mg/dL — ABNORMAL HIGH (ref 0.33–1.94)
LAMBDA FREE LGHT CHN: 1.5 mg/dL (ref 0.57–2.63)

## 2014-11-24 LAB — IGG, IGA, IGM
IGA: 88 mg/dL (ref 68–379)
IGG (IMMUNOGLOBIN G), SERUM: 1380 mg/dL (ref 650–1600)
IgM, Serum: 21 mg/dL — ABNORMAL LOW (ref 41–251)

## 2014-11-24 LAB — PROTEIN ELECTROPHORESIS, SERUM, WITH REFLEX
ABNORMAL PROTEIN BAND1: 0.2 g/dL
ABNORMAL PROTEIN BAND3: NOT DETECTED g/dL
ALBUMIN ELP: 4.5 g/dL (ref 3.8–4.8)
Abnormal Protein Band2: 0.3 g/dL
Alpha-1-Globulin: 0.3 g/dL (ref 0.2–0.3)
Alpha-2-Globulin: 0.7 g/dL (ref 0.5–0.9)
BETA GLOBULIN: 0.4 g/dL (ref 0.4–0.6)
Beta 2: 0.4 g/dL (ref 0.2–0.5)
GAMMA GLOBULIN: 1.2 g/dL (ref 0.8–1.7)
TOTAL PROTEIN, SERUM ELECTROPHOR: 7.5 g/dL (ref 6.1–8.1)

## 2014-11-24 LAB — IFE INTERPRETATION

## 2014-11-24 NOTE — Telephone Encounter (Signed)
Pt called about MRI said no one called him. I talked with Peggy and she said she tried several times to contact pt no answer or voice mail. I gave pt main scheduling number to call and schedule MRI

## 2014-12-01 ENCOUNTER — Ambulatory Visit (HOSPITAL_COMMUNITY)
Admission: RE | Admit: 2014-12-01 | Discharge: 2014-12-01 | Disposition: A | Discharge status: Home / Self Care | Payer: BLUE CROSS/BLUE SHIELD | Source: Ambulatory Visit | Attending: Family | Admitting: Family

## 2014-12-01 ENCOUNTER — Other Ambulatory Visit: Payer: Self-pay | Admitting: Hematology & Oncology

## 2014-12-01 ENCOUNTER — Other Ambulatory Visit: Payer: Self-pay | Admitting: Family

## 2014-12-01 ENCOUNTER — Other Ambulatory Visit: Payer: Self-pay | Admitting: *Deleted

## 2014-12-01 ENCOUNTER — Encounter: Payer: Self-pay | Admitting: *Deleted

## 2014-12-01 DIAGNOSIS — C9000 Multiple myeloma not having achieved remission: Secondary | ICD-10-CM | POA: Diagnosis not present

## 2014-12-01 DIAGNOSIS — M549 Dorsalgia, unspecified: Secondary | ICD-10-CM | POA: Insufficient documentation

## 2014-12-01 DIAGNOSIS — M4806 Spinal stenosis, lumbar region: Secondary | ICD-10-CM | POA: Insufficient documentation

## 2014-12-01 MED ORDER — LENALIDOMIDE 10 MG PO CAPS: 10.0000 mg | ORAL_CAPSULE | Freq: Every day | ORAL | Status: DC

## 2014-12-01 MED ORDER — GADOBENATE DIMEGLUMINE 529 MG/ML IV SOLN
19.0000 mL | Freq: Once | INTRAVENOUS | Status: AC | PRN
  Administered 2014-12-01: 19 mL via INTRAVENOUS

## 2014-12-09 ENCOUNTER — Ambulatory Visit (HOSPITAL_BASED_OUTPATIENT_CLINIC_OR_DEPARTMENT_OTHER): Payer: BLUE CROSS/BLUE SHIELD

## 2014-12-09 ENCOUNTER — Encounter: Payer: Self-pay | Admitting: Hematology & Oncology

## 2014-12-09 ENCOUNTER — Ambulatory Visit (HOSPITAL_BASED_OUTPATIENT_CLINIC_OR_DEPARTMENT_OTHER): Payer: BLUE CROSS/BLUE SHIELD | Admitting: Hematology & Oncology

## 2014-12-09 VITALS — BP 140/79 | HR 86 | Temp 98.3°F | Resp 18 | Ht 71.0 in | Wt 213.0 lb

## 2014-12-09 DIAGNOSIS — C9000 Multiple myeloma not having achieved remission: Secondary | ICD-10-CM

## 2014-12-09 DIAGNOSIS — M25551 Pain in right hip: Secondary | ICD-10-CM

## 2014-12-09 DIAGNOSIS — Z5112 Encounter for antineoplastic immunotherapy: Secondary | ICD-10-CM | POA: Diagnosis not present

## 2014-12-09 LAB — CMP (CANCER CENTER ONLY)
ALT(SGPT): 31 U/L (ref 10–47)
AST: 23 U/L (ref 11–38)
Albumin: 4.2 g/dL (ref 3.3–5.5)
Alkaline Phosphatase: 56 U/L (ref 26–84)
BILIRUBIN TOTAL: 1.6 mg/dL (ref 0.20–1.60)
BUN: 15 mg/dL (ref 7–22)
CALCIUM: 9.2 mg/dL (ref 8.0–10.3)
CHLORIDE: 103 meq/L (ref 98–108)
CO2: 30 meq/L (ref 18–33)
Creat: 1.3 mg/dl — ABNORMAL HIGH (ref 0.6–1.2)
GLUCOSE: 140 mg/dL — AB (ref 73–118)
Potassium: 4.2 mEq/L (ref 3.3–4.7)
Sodium: 137 mEq/L (ref 128–145)
Total Protein: 7.4 g/dL (ref 6.4–8.1)

## 2014-12-09 LAB — CBC WITH DIFFERENTIAL (CANCER CENTER ONLY)
BASO#: 0 10*3/uL (ref 0.0–0.2)
BASO%: 0.5 % (ref 0.0–2.0)
EOS ABS: 0.1 10*3/uL (ref 0.0–0.5)
EOS%: 5.9 % (ref 0.0–7.0)
HEMATOCRIT: 34.2 % — AB (ref 38.7–49.9)
HGB: 11.5 g/dL — ABNORMAL LOW (ref 13.0–17.1)
LYMPH#: 0.8 10*3/uL — ABNORMAL LOW (ref 0.9–3.3)
LYMPH%: 36 % (ref 14.0–48.0)
MCH: 32.5 pg (ref 28.0–33.4)
MCHC: 33.6 g/dL (ref 32.0–35.9)
MCV: 97 fL (ref 82–98)
MONO#: 0.4 10*3/uL (ref 0.1–0.9)
MONO%: 15.8 % — ABNORMAL HIGH (ref 0.0–13.0)
NEUT%: 41.8 % (ref 40.0–80.0)
NEUTROS ABS: 0.9 10*3/uL — AB (ref 1.5–6.5)
Platelets: 155 10*3/uL (ref 145–400)
RBC: 3.54 10*6/uL — ABNORMAL LOW (ref 4.20–5.70)
RDW: 13.4 % (ref 11.1–15.7)
WBC: 2.2 10*3/uL — ABNORMAL LOW (ref 4.0–10.0)

## 2014-12-09 MED ORDER — ZOLEDRONIC ACID 4 MG/100ML IV SOLN
4.0000 mg | Freq: Once | INTRAVENOUS | Status: AC
  Administered 2014-12-09: 4 mg via INTRAVENOUS
  Filled 2014-12-09: qty 100

## 2014-12-09 MED ORDER — BORTEZOMIB CHEMO SQ INJECTION 3.5 MG (2.5MG/ML)
1.3000 mg/m2 | Freq: Once | INTRAMUSCULAR | Status: AC
  Administered 2014-12-09: 2.75 mg via SUBCUTANEOUS
  Filled 2014-12-09: qty 2.75

## 2014-12-09 MED ORDER — ONDANSETRON HCL 8 MG PO TABS
8.0000 mg | ORAL_TABLET | Freq: Once | ORAL | Status: AC
  Administered 2014-12-09: 8 mg via ORAL

## 2014-12-09 MED ORDER — TAPENTADOL HCL 75 MG PO TABS: 75.0000 mg | ORAL_TABLET | ORAL | Status: DC | PRN

## 2014-12-09 MED ORDER — KETOROLAC TROMETHAMINE 15 MG/ML IJ SOLN
30.0000 mg | Freq: Once | INTRAMUSCULAR | Status: AC
  Administered 2014-12-09: 30 mg via INTRAVENOUS

## 2014-12-09 MED ORDER — KETOROLAC TROMETHAMINE 15 MG/ML IJ SOLN
INTRAMUSCULAR | Status: AC
  Filled 2014-12-09: qty 2

## 2014-12-09 MED ORDER — FENTANYL 75 MCG/HR TD PT72: 75.0000 ug | MEDICATED_PATCH | TRANSDERMAL | Status: DC

## 2014-12-09 MED ORDER — ONDANSETRON HCL 8 MG PO TABS
ORAL_TABLET | ORAL | Status: AC
  Filled 2014-12-09: qty 1

## 2014-12-09 NOTE — Progress Notes (Signed)
Hematology and Oncology Follow Up Visit  Mark [name] [id number] Jul 26, 1971 44 y.o. 12/09/2014   Principle Diagnosis:   Kappa light chain myeloma  Current Therapy:   Revlimid 10mg  po q day (21/7) - to be on hold Zometa 4 mg IV q. month Velcade q 3 week dosing - to be on hold     Interim History:  Mark [name] is back for followup. His back is doing better. Now it's his right hip that really is causing problems.  He had a MRI done a couple weeks ago. The MRI of the back did not show anything new or active with myeloma. He had the operative changes at L5-S1. He had some treated areas of myeloma.  The right hip really is causing him problems. He has a hard time sitting down and getting up because of pain in the right hip. It does not radiate down his right leg.  He has a little bit of pain in the left hip but not as bad.  His myeloma studies have been doing pretty well. His last myeloma studies done in April showed a monoclonal protein of 0.2 g/dL. His Kappa Lightchain was 2.79 mg/dL.  He's had no problems with nausea vomiting. Has been no change in bowel or bladder habits. He's had no rashes. He's had no leg swelling. He's had no headache. He's had no cough or shortness of breath.  Again, we will have to get an MRI of the right hip to figure out what is going on. He is at risk for avascular necrosis. It possibly dismay be bursitis.  I don't think plain x-rays will help [place] given his complex history.   Overall, his performance status is ECOG 1.  Medications:  Current outpatient prescriptions:  .  acetaminophen (TYLENOL) 500 MG tablet, Take 500 mg by mouth every 6 (six) hours as needed for moderate pain or headache., Disp: , Rfl:  .  acyclovir (ZOVIRAX) 800 MG tablet, Take 1 tablet (800 mg total) by mouth 2 (two) times daily., Disp: 60 tablet, Rfl: 0 .  aspirin EC 325 MG tablet, Take 325 mg by mouth daily., Disp: , Rfl:  .  bisacodyl (DULCOLAX) 5 MG EC tablet, Take 5 mg by mouth daily  as needed for moderate constipation., Disp: , Rfl:  .  calcium gluconate 500 MG tablet, Take 1 tablet by mouth daily., Disp: , Rfl:  .  fentaNYL (DURAGESIC - DOSED MCG/HR) 75 MCG/HR, Place 1 patch (75 mcg total) onto the skin every 3 (three) days., Disp: 10 patch, Rfl: 0 .  gabapentin (NEURONTIN) 300 MG capsule, Take 2 capsules (600 mg total) by mouth 3 (three) times daily., Disp: 180 capsule, Rfl: 3 .  lenalidomide (REVLIMID) 10 MG capsule, Take 1 capsule (10 mg total) by mouth daily. Josem Kaufmann #[id number], Disp: 28 capsule, Rfl: 0 .  metoCLOPramide (REGLAN) 5 MG tablet, Take 1 tablet (5 mg total) by mouth 3 (three) times daily before meals., Disp: 45 tablet, Rfl: 0 .  ondansetron (ZOFRAN ODT) 8 MG disintegrating tablet, Take 1 tablet (8 mg total) by mouth every 8 (eight) hours as needed for nausea or vomiting., Disp: 20 tablet, Rfl: 0 .  oxyCODONE-acetaminophen (PERCOCET) 10-325 MG per tablet, Take 1 tablet by mouth every 4 (four) hours as needed for pain., Disp: 120 tablet, Rfl: 0 .  pantoprazole (PROTONIX) 40 MG tablet, Take 1 tablet (40 mg total) by mouth daily., Disp: 30 tablet, Rfl: 1 .  prochlorperazine (COMPAZINE) 10 MG tablet, Take 10 mg by  mouth every 8 (eight) hours as needed for nausea or vomiting., Disp: , Rfl:  .  Tapentadol HCl (NUCYNTA) 75 MG TABS, Take 1 tablet (75 mg total) by mouth every 4 (four) hours as needed., Disp: 90 tablet, Rfl: 0 No current facility-administered medications for this visit.  Facility-Administered Medications Ordered in Other Visits:  .  bortezomib SQ (VELCADE) chemo injection 2.75 mg, 1.3 mg/m2 (Treatment Plan Actual), Subcutaneous, Once, [name], MD .  zolendronic acid (ZOMETA) 4 mg in sodium chloride 0.9 % 100 mL IVPB, 4 mg, Intravenous, Once, [name], MD  Allergies:  Allergies  Allergen Reactions  . Gadolinium Derivatives Nausea And Vomiting    Pt became very nauseous and got sick.     Past Medical History, Surgical history, Social  history, and Family History were reviewed and updated.  Review of Systems: As above  Physical Exam:  height is 5\' 11"  (1.803 m) and weight is 213 lb (96.616 kg). His oral temperature is 98.3 F (36.8 C). His blood pressure is 140/79 and his pulse is 86. His respiration is 18.   Well-developed and well-nourished African American gentleman. He is alert and oriented x3. Vital signs are stable. Head and neck exam shows no ocular or oral lesions. He has no palpable cervical or supraclavicular lymph nodes. Oral cavity is totally dry. Lungs are clear. Cardiac exam regular rate and rhythm with no murmurs rubs or bruits. Abdomen is soft. He has good bowel sounds. There is no fluid wave. There is no palpable liver or spleen tip. Back exam shows no tenderness over the spine or ribs. He is a pain in the right hip. There is pain with flexion and extension he has significant pain with hip abduction and hip abduction.. Extremities shows no clubbing cyanosis or edema.  He has good range of motion of his knees. There is no swelling of his knee joint. Skin exam no rashes. Neurological exam is nonfocal. He has good strength in his legs and feet. Massive slight decrease to sensation in his feet and lower legs. There is no weakness.  Lab Results  Component Value Date   WBC 2.2* 12/09/2014   HGB 11.5* 12/09/2014   HCT 34.2* 12/09/2014   MCV 97 12/09/2014   PLT 155 12/09/2014     Chemistry      Component Value Date/Time   NA 142 11/20/2014 1306   NA 138 11/07/2014 0600   NA 145 02/21/2013 0908   K 4.1 11/20/2014 1306   K 3.9 11/07/2014 0600   K 3.8 02/21/2013 0908   CL 102 11/20/2014 1306   CL 104 11/07/2014 0600   CO2 31 11/20/2014 1306   CO2 26 11/07/2014 0600   CO2 32* 02/21/2013 0908   BUN 19 11/20/2014 1306   BUN 9 11/07/2014 0600   BUN 19.5 02/21/2013 0908   CREATININE 1.3* 11/20/2014 1306   CREATININE 1.13 11/07/2014 0600   CREATININE 1.5* 02/21/2013 0908      Component Value Date/Time    CALCIUM 10.0 11/20/2014 1306   CALCIUM 8.7 11/07/2014 0600   CALCIUM 17.7 Repeated and Verified* 02/21/2013 0908   CALCIUM >15.0* 02/01/2013 1230   ALKPHOS 58 11/20/2014 1306   ALKPHOS 54 11/03/2014 0430   AST 40* 11/20/2014 1306   AST 21 11/03/2014 0430   ALT 77* 11/20/2014 1306   ALT 18 11/03/2014 0430   BILITOT 1.50 11/20/2014 1306   BILITOT 0.7 11/03/2014 0430         Impression and Plan:  Mark [name] is 44 year old -year-old African American male. 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 This was in January or early February of 2015. This was done at [facility].  I wish I knew what was wrong with his right hip. I worry about avascular necrosis. He's had a lot of chemotherapy. He's had a lot of steroids. I think we will have to do an MRI to find out what is going on.  I'm going to increase his Duragesic patch to 75 g. I will also put him on Nucynta at Center 5 mg by mouth every 4 hours when necessary.  I will give him a dose of Toradol today as he is getting Zometa.  I cannot imagine that this is anything related to myeloma. I don't think he has any active myeloma by his scans and labs.  I will get him back in another 3 weeks.  [name], MD 5/10/201611:22 AM

## 2014-12-09 NOTE — Patient Instructions (Signed)
Bortezomib injection What is this medicine? BORTEZOMIB (bor TEZ oh mib) is a chemotherapy drug. It slows the growth of cancer cells. This medicine is used to treat multiple myeloma, and certain lymphomas, such as mantle-cell lymphoma. This medicine may be used for other purposes; ask your health care provider or pharmacist if you have questions. COMMON BRAND NAME(S): Velcade What should I tell my health care provider before I take this medicine? They need to know if you have any of these conditions: -diabetes -heart disease -irregular heartbeat -liver disease -on hemodialysis -low blood counts, like low white blood cells, platelets, or hemoglobin -peripheral neuropathy -taking medicine for blood pressure -an unusual or allergic reaction to bortezomib, mannitol, boron, other medicines, foods, dyes, or preservatives -pregnant or trying to get pregnant -breast-feeding How should I use this medicine? This medicine is for injection into a vein or for injection under the skin. It is given by a health care professional in a hospital or clinic setting. Talk to your pediatrician regarding the use of this medicine in children. Special care may be needed. Overdosage: If you think you have taken too much of this medicine contact a poison control center or emergency room at once. NOTE: This medicine is only for you. Do not share this medicine with others. What if I miss a dose? It is important not to miss your dose. Call your doctor or health care professional if you are unable to keep an appointment. What may interact with this medicine? This medicine may interact with the following medications: -ketoconazole -rifampin -ritonavir -St. John's Wort This list may not describe all possible interactions. Give your health care provider a list of all the medicines, herbs, non-prescription drugs, or dietary supplements you use. Also tell them if you smoke, drink alcohol, or use illegal drugs. Some items  may interact with your medicine. What should I watch for while using this medicine? Visit your doctor for checks on your progress. This drug may make you feel generally unwell. This is not uncommon, as chemotherapy can affect healthy cells as well as cancer cells. Report any side effects. Continue your course of treatment even though you feel ill unless your doctor tells you to stop. You may get drowsy or dizzy. Do not drive, use machinery, or do anything that needs mental alertness until you know how this medicine affects you. Do not stand or sit up quickly, especially if you are an older patient. This reduces the risk of dizzy or fainting spells. In some cases, you may be given additional medicines to help with side effects. Follow all directions for their use. Call your doctor or health care professional for advice if you get a fever, chills or sore throat, or other symptoms of a cold or flu. Do not treat yourself. This drug decreases your body's ability to fight infections. Try to avoid being around people who are sick. This medicine may increase your risk to bruise or bleed. Call your doctor or health care professional if you notice any unusual bleeding. You may need blood work done while you are taking this medicine. In some patients, this medicine may cause a serious brain infection that may cause death. If you have any problems seeing, thinking, speaking, walking, or standing, tell your doctor right away. If you cannot reach your doctor, urgently seek other source of medical care. Do not become pregnant while taking this medicine. Women should inform their doctor if they wish to become pregnant or think they might be pregnant. There is   a potential for serious side effects to an unborn child. Talk to your health care professional or pharmacist for more information. Do not breast-feed an infant while taking this medicine. Check with your doctor or health care professional if you get an attack of  severe diarrhea, nausea and vomiting, or if you sweat a lot. The loss of too much body fluid can make it dangerous for you to take this medicine. What side effects may I notice from receiving this medicine? Side effects that you should report to your doctor or health care professional as soon as possible: -allergic reactions like skin rash, itching or hives, swelling of the face, lips, or tongue -breathing problems -changes in hearing -changes in vision -fast, irregular heartbeat -feeling faint or lightheaded, falls -pain, tingling, numbness in the hands or feet -right upper belly pain -seizures -swelling of the ankles, feet, hands -unusual bleeding or bruising -unusually weak or tired -vomiting -yellowing of the eyes or skin Side effects that usually do not require medical attention (report to your doctor or health care professional if they continue or are bothersome): -changes in emotions or moods -constipation -diarrhea -loss of appetite -headache -irritation at site where injected -nausea This list may not describe all possible side effects. Call your doctor for medical advice about side effects. You may report side effects to FDA at 1-800-FDA-1088. Where should I keep my medicine? This drug is given in a hospital or clinic and will not be stored at home. NOTE: This sheet is a summary. It may not cover all possible information. If you have questions about this medicine, talk to your doctor, pharmacist, or health care provider.  2015, [employer]. (2013-05-13 12:46:32) Zoledronic Acid injection (Hypercalcemia, Oncology) What is this medicine? ZOLEDRONIC ACID (ZOE le dron ik AS id) lowers the amount of calcium loss from bone. It is used to treat too much calcium in your blood from cancer. It is also used to prevent complications of cancer that has spread to the bone. This medicine may be used for other purposes; ask your health care provider or pharmacist if you have  questions. COMMON BRAND NAME(S): Zometa What should I tell my health care provider before I take this medicine? They need to know if you have any of these conditions: -aspirin-sensitive asthma -cancer, especially if you are receiving medicines used to treat cancer -dental disease or wear dentures -infection -kidney disease -receiving corticosteroids like dexamethasone or prednisone -an unusual or allergic reaction to zoledronic acid, other medicines, foods, dyes, or preservatives -pregnant or trying to get pregnant -breast-feeding How should I use this medicine? This medicine is for infusion into a vein. It is given by a health care professional in a hospital or clinic setting. Talk to your pediatrician regarding the use of this medicine in children. Special care may be needed. Overdosage: If you think you have taken too much of this medicine contact a poison control center or emergency room at once. NOTE: This medicine is only for you. Do not share this medicine with others. What if I miss a dose? It is important not to miss your dose. Call your doctor or health care professional if you are unable to keep an appointment. What may interact with this medicine? -certain antibiotics given by injection -NSAIDs, medicines for pain and inflammation, like ibuprofen or naproxen -some diuretics like bumetanide, furosemide -teriparatide -thalidomide This list may not describe all possible interactions. Give your health care provider a list of all the medicines, herbs, non-prescription drugs, or dietary   supplements you use. Also tell them if you smoke, drink alcohol, or use illegal drugs. Some items may interact with your medicine. What should I watch for while using this medicine? Visit your doctor or health care professional for regular checkups. It may be some time before you see the benefit from this medicine. Do not stop taking your medicine unless your doctor tells you to. Your doctor may  order blood tests or other tests to see how you are doing. Women should inform their doctor if they wish to become pregnant or think they might be pregnant. There is a potential for serious side effects to an unborn child. Talk to your health care professional or pharmacist for more information. You should make sure that you get enough calcium and vitamin D while you are taking this medicine. Discuss the foods you eat and the vitamins you take with your health care professional. Some people who take this medicine have severe bone, joint, and/or muscle pain. This medicine may also increase your risk for jaw problems or a broken thigh bone. Tell your doctor right away if you have severe pain in your jaw, bones, joints, or muscles. Tell your doctor if you have any pain that does not go away or that gets worse. Tell your dentist and dental surgeon that you are taking this medicine. You should not have major dental surgery while on this medicine. See your dentist to have a dental exam and fix any dental problems before starting this medicine. Take good care of your teeth while on this medicine. Make sure you see your dentist for regular follow-up appointments. What side effects may I notice from receiving this medicine? Side effects that you should report to your doctor or health care professional as soon as possible: -allergic reactions like skin rash, itching or hives, swelling of the face, lips, or tongue -anxiety, confusion, or depression -breathing problems -changes in vision -eye pain -feeling faint or lightheaded, falls -jaw pain, especially after dental work -mouth sores -muscle cramps, stiffness, or weakness -trouble passing urine or change in the amount of urine Side effects that usually do not require medical attention (report to your doctor or health care professional if they continue or are bothersome): -bone, joint, or muscle pain -constipation -diarrhea -fever -hair loss -irritation  at site where injected -loss of appetite -nausea, vomiting -stomach upset -trouble sleeping -trouble swallowing -weak or tired This list may not describe all possible side effects. Call your doctor for medical advice about side effects. You may report side effects to FDA at 1-800-FDA-1088. Where should I keep my medicine? This drug is given in a hospital or clinic and will not be stored at home. NOTE: This sheet is a summary. It may not cover all possible information. If you have questions about this medicine, talk to your doctor, pharmacist, or health care provider.  2015, [employer]. (2012-12-27 13:03:13)  

## 2014-12-11 LAB — PROTEIN ELECTROPHORESIS, SERUM, WITH REFLEX
ALBUMIN ELP: 4.5 g/dL (ref 3.8–4.8)
Abnormal Protein Band1: 0.1 g/dL
Abnormal Protein Band2: 0.3 g/dL
Abnormal Protein Band3: NOT DETECTED g/dL
Alpha-1-Globulin: 0.3 g/dL (ref 0.2–0.3)
Alpha-2-Globulin: 0.6 g/dL (ref 0.5–0.9)
BETA 2: 0.3 g/dL (ref 0.2–0.5)
BETA GLOBULIN: 0.4 g/dL (ref 0.4–0.6)
Gamma Globulin: 1.1 g/dL (ref 0.8–1.7)
Total Protein, Serum Electrophoresis: 7.2 g/dL (ref 6.1–8.1)

## 2014-12-11 LAB — IFE INTERPRETATION

## 2014-12-11 LAB — IGG, IGA, IGM
IGM, SERUM: 31 mg/dL — AB (ref 41–251)
IgA: 63 mg/dL — ABNORMAL LOW (ref 68–379)
IgG (Immunoglobin G), Serum: 1290 mg/dL (ref 650–1600)

## 2014-12-11 LAB — KAPPA/LAMBDA LIGHT CHAINS
KAPPA FREE LGHT CHN: 2.98 mg/dL — AB (ref 0.33–1.94)
KAPPA LAMBDA RATIO: 1.65 (ref 0.26–1.65)
Lambda Free Lght Chn: 1.81 mg/dL (ref 0.57–2.63)

## 2014-12-12 ENCOUNTER — Ambulatory Visit (HOSPITAL_COMMUNITY)
Admission: RE | Admit: 2014-12-12 | Discharge: 2014-12-12 | Disposition: A | Discharge status: Home / Self Care | Payer: BLUE CROSS/BLUE SHIELD | Source: Ambulatory Visit | Attending: Hematology & Oncology | Admitting: Hematology & Oncology

## 2014-12-12 DIAGNOSIS — M25551 Pain in right hip: Secondary | ICD-10-CM | POA: Diagnosis not present

## 2014-12-12 DIAGNOSIS — C9000 Multiple myeloma not having achieved remission: Secondary | ICD-10-CM | POA: Diagnosis not present

## 2014-12-12 MED ORDER — GADOBENATE DIMEGLUMINE 529 MG/ML IV SOLN
20.0000 mL | Freq: Once | INTRAVENOUS | Status: AC | PRN
  Administered 2014-12-12: 19 mL via INTRAVENOUS

## 2014-12-24 ENCOUNTER — Ambulatory Visit: Payer: BLUE CROSS/BLUE SHIELD

## 2014-12-24 ENCOUNTER — Ambulatory Visit
Admission: RE | Admit: 2014-12-24 | Discharge: 2014-12-24 | Disposition: A | Discharge status: Home / Self Care | Payer: BLUE CROSS/BLUE SHIELD | Source: Ambulatory Visit | Attending: Radiation Oncology | Admitting: Radiation Oncology

## 2014-12-24 DIAGNOSIS — C9000 Multiple myeloma not having achieved remission: Secondary | ICD-10-CM | POA: Insufficient documentation

## 2014-12-25 ENCOUNTER — Other Ambulatory Visit: Payer: Self-pay | Admitting: *Deleted

## 2014-12-25 ENCOUNTER — Ambulatory Visit
Admission: RE | Admit: 2014-12-25 | Discharge: 2014-12-25 | Disposition: A | Discharge status: Home / Self Care | Payer: BLUE CROSS/BLUE SHIELD | Source: Ambulatory Visit | Attending: Radiation Oncology | Admitting: Radiation Oncology

## 2014-12-25 ENCOUNTER — Encounter: Payer: Self-pay | Admitting: Radiation Oncology

## 2014-12-25 VITALS — BP 127/71 | HR 106 | Temp 99.4°F | Resp 16 | Ht 71.0 in | Wt 218.5 lb

## 2014-12-25 DIAGNOSIS — C9000 Multiple myeloma not having achieved remission: Secondary | ICD-10-CM | POA: Diagnosis not present

## 2014-12-25 NOTE — Progress Notes (Signed)
Histology and Location of Primary Cancer: Kappa light chain myeloma  Location(s) of Symptomatic Metastases: right hip - MRI of the right hip from 12/13/14 shows "There are multiple myeloma lesions in the bones of the pelvis including a 2.2 cm lesion in the right ilium just above the right acetabulum.Marland KitchenMarland KitchenThere are multiple lesions in the left ilium. There is a small lesion in the left pubic body and a lesion in the right ischial tuberosity."  Past/Anticipated chemotherapy by medical oncology, if any: Revlimid 62m po q day (21/7) - to be on hold, Zometa 4 mg IV q. Month, Velcade q 3 week dosing . He is s/p stem cell transplant for his myeloma. This was in January or early February of 2015. [facility]   Pain on a scale of 0-10 is: 6/10 in right hip.  Uses a fentanyl patch (75 mcg) prn and percocet at night.  Reports more pain in certain positions or trying to get up from a lying position.   Current Decadron regimen, if applicable: no  Ambulatory status? Walker? Wheelchair?: ambulatory, reports occasional trouble walking, he has to stretch a lot.  SAFETY ISSUES:  Prior radiation? Yes 02/07/13- 02/26/13 lower L spine, upper sacrum, 35 gray in 14 fractions  Pacemaker/ICD? no  Possible current pregnancy? no  Is the patient on methotrexate? no  Current Complaints / other details: Patient is here with his wife.  He is supposed to go back to work on Saturday.  He works on a sEducation officer, community  BP 127/71 mmHg  Pulse 106  Temp(Src) 99.4 F (37.4 C) (Oral)  Resp 16  Ht '5\' 11"'  (1.803 m)  Wt 218 lb 8 oz (99.111 kg)  BMI 30.49 kg/m2  SpO2 98%

## 2014-12-25 NOTE — Progress Notes (Addendum)
Radiation Oncology         [phone] ________________________________  Name: Mark [name] MRN: [id number]  Date: 12/25/2014  DOB: 1971/02/19  Re-evaluation Visit Note  CC: No PCP Per Patient  [name], MD  Diagnosis:   Light chain multiple myeloma    Interval Since Last Radiation:  1 year 10 months  Previous radiation therapy:  Radiation treatment dates: July 10 through July 29 , 2014  Site/dose: Lower lumbar spine and upper sacrum, 35 gray in 14 fractions  Beams/energy: 4 field set up using 15 MV photons    Narrative:  The patient returns today at the courtesy of Dr. Burney Gauze for consideration for additional radiation therapy as part of management of his progressive myeloma. Patient is here with his wife. He is supposed to go back to work on Saturday. He works on a Education officer, community. Patient c/o pain right lower hip and leg. Patient uses a Fentanyl patch, Tylenol, and Percocet for pain relief. Patient says he had severe back pain but had a Laminectomy and the pain isn't as severe in his upper lumbar region since this surgery. C/o numbness in right upper leg/thigh area.                             ALLERGIES:  is allergic to gadolinium derivatives.  Meds: Current Outpatient Prescriptions  Medication Sig Dispense Refill  . acetaminophen (TYLENOL) 500 MG tablet Take 500 mg by mouth every 6 (six) hours as needed for moderate pain or headache.    Marland Kitchen acyclovir (ZOVIRAX) 800 MG tablet Take 1 tablet (800 mg total) by mouth 2 (two) times daily. 60 tablet 0  . bisacodyl (DULCOLAX) 5 MG EC tablet Take 5 mg by mouth daily as needed for moderate constipation.    . calcium gluconate 500 MG tablet Take 1 tablet by mouth daily.    . fentaNYL (DURAGESIC - DOSED MCG/HR) 75 MCG/HR Place 1 patch (75 mcg total) onto the skin every 3 (three) days. 10 patch 0  . gabapentin (NEURONTIN) 300 MG capsule Take 2 capsules (600 mg total) by mouth 3 (three) times daily. 180 capsule 3  .  lenalidomide (REVLIMID) 10 MG capsule Take 1 capsule (10 mg total) by mouth daily. Auth #[id number] 28 capsule 0  . oxyCODONE-acetaminophen (PERCOCET) 10-325 MG per tablet Take 1 tablet by mouth every 4 (four) hours as needed for pain. 120 tablet 0  . Tapentadol HCl (NUCYNTA) 75 MG TABS Take 1 tablet (75 mg total) by mouth every 4 (four) hours as needed. 90 tablet 0  . aspirin EC 325 MG tablet Take 325 mg by mouth daily.    . metoCLOPramide (REGLAN) 5 MG tablet Take 1 tablet (5 mg total) by mouth 3 (three) times daily before meals. (Patient not taking: Reported on 12/25/2014) 45 tablet 0  . ondansetron (ZOFRAN ODT) 8 MG disintegrating tablet Take 1 tablet (8 mg total) by mouth every 8 (eight) hours as needed for nausea or vomiting. (Patient not taking: Reported on 12/25/2014) 20 tablet 0  . pantoprazole (PROTONIX) 40 MG tablet Take 1 tablet (40 mg total) by mouth daily. (Patient not taking: Reported on 12/25/2014) 30 tablet 1  . prochlorperazine (COMPAZINE) 10 MG tablet Take 10 mg by mouth every 8 (eight) hours as needed for nausea or vomiting.     No current facility-administered medications for this encounter.    Physical Findings: The patient is in no acute distress. Patient is  alert and oriented. BP 127/71 mmHg  Pulse 106  Temp(Src) 99.4 F (37.4 C) (Oral)  Resp 16  Ht 5' 11" (1.803 m)  Wt 218 lb 8 oz (99.111 kg)  BMI 30.49 kg/m2  SpO2 98%.  Lungs are clear. Heart has regular rate and rhythm. No palpable cervical, supraclavicular, or axillary adenopathy. Good strength in lower extremities. Patient localizes his pain in the right upper buttocks area  Lab Findings: Lab Results  Component Value Date   WBC 2.2* 12/09/2014   HGB 11.5* 12/09/2014   HCT 34.2* 12/09/2014   MCV 97 12/09/2014   PLT 155 12/09/2014   Radiographic Findings: Ct Abdomen Pelvis W Contrast  03/21/2013   *RADIOLOGY REPORT*  Clinical Data: Multiple myeloma  CT ABDOMEN AND PELVIS WITH CONTRAST  Technique:  Multidetector  CT imaging of the abdomen and pelvis was performed following the standard protocol during bolus administration of intravenous contrast.  Contrast: 161m OMNIPAQUE IOHEXOL 300 MG/ML  SOLN intravenously.  Comparison: CT scan of May 24, 2010; MRI scan of February 01, 2013.  Findings: Stable scarring is noted in both lung bases.  No focal abnormality is noted in the liver, spleen or pancreas.  No gallstones are noted.  Adrenal glands and kidneys appear normal. No hydronephrosis or renal obstruction is noted.  No gross bowel abnormality is seen.  Urinary bladder appears normal.  No abnormal fluid collections or significant adenopathy is noted.  There is a destructive soft tissue lesion seen involving the right sided pedicle, transverse process and body of L5 which currently measures 5.3 x 4.8 cm and appears to be slightly smaller compared to previous MRI.  There is also noted a destructive lesion measuring 3.2 x 1.5 cm in the left iliac wing as well as 2.9 x 2.2 cm lytic area in the right iliac bone just above the acetabulum. Another abnormality measuring 14 x 11 mm is noted in the superior portion of the right iliac wing.  Multiple other lucencies are noted throughout the spine and pelvis consistent with history of multiple myeloma.  At least two lytic lesions are seen involving the right lower ribs inferiorly.  IMPRESSION: Multiple lytic lesions are noted throughout the visualized skeleton, including the right ribs, spine and pelvis, consistent with history of multiple myeloma.  The largest lesion is noted involving the right side of the L5 vertebral body, pedicle and transverse process which currently measures 5.3 x 4.8 cm and may be slightly decreased in size compared to prior MRI exam.  Other large lytic lesions are seen involving both iliac bones as described above.   Original Report Authenticated By: JMarijo Conception,  M.D.    Impression:  Recurrent myeloma.   patient has recurrent disease along L5  Area which  explains a lot of pain and numbness he is feeling. Has a lesion in the right ilium just above the right acetabulum which maybe contributing to pain as well.  Plan:  Simulation this afternoon start treatment next week. Total dose is somewhat limited due to previous treatment to this area.   This document serves as a record of services personally performed by JGery Pray MD. It was created on his behalf by LJeralene Peters a trained medical scribe. The creation of this record is based on the scribe's personal observations and the provider's statements to them. This document has been checked and approved by the attending provider.       -----------------------------------  JBlair Promise PhD, MD

## 2014-12-25 NOTE — Progress Notes (Signed)
Please see the Nurse Progress Note in the MD Initial Consult Encounter for this patient. 

## 2014-12-26 ENCOUNTER — Observation Stay (HOSPITAL_COMMUNITY)
Admission: EM | Admit: 2014-12-26 | Discharge: 2014-12-27 | Disposition: A | Discharge status: Home / Self Care | Payer: BLUE CROSS/BLUE SHIELD | Attending: Internal Medicine | Admitting: Internal Medicine

## 2014-12-26 ENCOUNTER — Encounter (HOSPITAL_COMMUNITY): Payer: Self-pay | Admitting: Emergency Medicine

## 2014-12-26 ENCOUNTER — Other Ambulatory Visit: Payer: Self-pay | Admitting: Nurse Practitioner

## 2014-12-26 DIAGNOSIS — K219 Gastro-esophageal reflux disease without esophagitis: Secondary | ICD-10-CM | POA: Diagnosis not present

## 2014-12-26 DIAGNOSIS — R748 Abnormal levels of other serum enzymes: Secondary | ICD-10-CM | POA: Insufficient documentation

## 2014-12-26 DIAGNOSIS — N189 Chronic kidney disease, unspecified: Secondary | ICD-10-CM | POA: Diagnosis present

## 2014-12-26 DIAGNOSIS — Z833 Family history of diabetes mellitus: Secondary | ICD-10-CM | POA: Insufficient documentation

## 2014-12-26 DIAGNOSIS — Z87891 Personal history of nicotine dependence: Secondary | ICD-10-CM | POA: Insufficient documentation

## 2014-12-26 DIAGNOSIS — T404X1A Poisoning by other synthetic narcotics, accidental (unintentional), initial encounter: Secondary | ICD-10-CM | POA: Diagnosis not present

## 2014-12-26 DIAGNOSIS — C9000 Multiple myeloma not having achieved remission: Secondary | ICD-10-CM | POA: Diagnosis present

## 2014-12-26 DIAGNOSIS — Z8249 Family history of ischemic heart disease and other diseases of the circulatory system: Secondary | ICD-10-CM | POA: Insufficient documentation

## 2014-12-26 DIAGNOSIS — E860 Dehydration: Secondary | ICD-10-CM

## 2014-12-26 DIAGNOSIS — T40411A Poisoning by fentanyl or fentanyl analogs, accidental (unintentional), initial encounter: Secondary | ICD-10-CM | POA: Diagnosis present

## 2014-12-26 DIAGNOSIS — N182 Chronic kidney disease, stage 2 (mild): Secondary | ICD-10-CM | POA: Diagnosis not present

## 2014-12-26 DIAGNOSIS — G629 Polyneuropathy, unspecified: Secondary | ICD-10-CM

## 2014-12-26 DIAGNOSIS — R7989 Other specified abnormal findings of blood chemistry: Secondary | ICD-10-CM | POA: Diagnosis present

## 2014-12-26 DIAGNOSIS — R778 Other specified abnormalities of plasma proteins: Secondary | ICD-10-CM | POA: Diagnosis present

## 2014-12-26 DIAGNOSIS — N179 Acute kidney failure, unspecified: Secondary | ICD-10-CM | POA: Diagnosis present

## 2014-12-26 DIAGNOSIS — Z7982 Long term (current) use of aspirin: Secondary | ICD-10-CM | POA: Insufficient documentation

## 2014-12-26 LAB — CBC WITH DIFFERENTIAL/PLATELET
BASOS ABS: 0 10*3/uL (ref 0.0–0.1)
Basophils Relative: 0 % (ref 0–1)
Eosinophils Absolute: 0 10*3/uL (ref 0.0–0.7)
Eosinophils Relative: 0 % (ref 0–5)
HCT: 32.6 % — ABNORMAL LOW (ref 39.0–52.0)
Hemoglobin: 10.6 g/dL — ABNORMAL LOW (ref 13.0–17.0)
Lymphocytes Relative: 4 % — ABNORMAL LOW (ref 12–46)
Lymphs Abs: 0.4 10*3/uL — ABNORMAL LOW (ref 0.7–4.0)
MCH: 31.4 pg (ref 26.0–34.0)
MCHC: 32.5 g/dL (ref 30.0–36.0)
MCV: 96.4 fL (ref 78.0–100.0)
Monocytes Absolute: 0.7 10*3/uL (ref 0.1–1.0)
Monocytes Relative: 6 % (ref 3–12)
NEUTROS ABS: 10.5 10*3/uL — AB (ref 1.7–7.7)
Neutrophils Relative %: 90 % — ABNORMAL HIGH (ref 43–77)
PLATELETS: 121 10*3/uL — AB (ref 150–400)
RBC: 3.38 MIL/uL — ABNORMAL LOW (ref 4.22–5.81)
RDW: 13.6 % (ref 11.5–15.5)
WBC: 11.6 10*3/uL — AB (ref 4.0–10.5)

## 2014-12-26 LAB — I-STAT TROPONIN, ED: Troponin i, poc: 0.12 ng/mL (ref 0.00–0.08)

## 2014-12-26 LAB — I-STAT CG4 LACTIC ACID, ED: LACTIC ACID, VENOUS: 2.53 mmol/L — AB (ref 0.5–2.0)

## 2014-12-26 LAB — I-STAT CHEM 8, ED
BUN: 15 mg/dL (ref 6–20)
CHLORIDE: 100 mmol/L — AB (ref 101–111)
CREATININE: 1.6 mg/dL — AB (ref 0.61–1.24)
Calcium, Ion: 1.15 mmol/L (ref 1.12–1.23)
Glucose, Bld: 159 mg/dL — ABNORMAL HIGH (ref 65–99)
HCT: 35 % — ABNORMAL LOW (ref 39.0–52.0)
HEMOGLOBIN: 11.9 g/dL — AB (ref 13.0–17.0)
POTASSIUM: 3.8 mmol/L (ref 3.5–5.1)
Sodium: 140 mmol/L (ref 135–145)
TCO2: 26 mmol/L (ref 0–100)

## 2014-12-26 MED ORDER — SODIUM CHLORIDE 0.9 % IV SOLN: INTRAVENOUS | Status: DC

## 2014-12-26 MED ORDER — LENALIDOMIDE 10 MG PO CAPS: 10.0000 mg | ORAL_CAPSULE | Freq: Every day | ORAL | Status: DC

## 2014-12-26 MED ORDER — NALOXONE HCL 0.4 MG/ML IJ SOLN
0.4000 mg | INTRAMUSCULAR | Status: DC | PRN
  Administered 2014-12-26: 0.4 mg via INTRAVENOUS
  Filled 2014-12-26: qty 1

## 2014-12-26 MED ORDER — SODIUM CHLORIDE 0.9 % IV BOLUS (SEPSIS)
1000.0000 mL | Freq: Once | INTRAVENOUS | Status: AC
  Administered 2014-12-26: 1000 mL via INTRAVENOUS

## 2014-12-26 NOTE — ED Notes (Signed)
Bed: WA10 Expected date: 12/26/14 Expected time: 8:24 PM Means of arrival: Ambulance Comments: Ca pt, fentanyl patch overdose

## 2014-12-26 NOTE — Progress Notes (Signed)
Test  Test tll

## 2014-12-26 NOTE — ED Notes (Signed)
Pt BIB EMS. EMS states family was doing CPR on pt when they arrived. Pt had strong radial pulses, pinpoint pupils and shallow respirations per EMS. Pt was given Narcan 2mg  intranasally at 2000 by EMS. EMS removed 2 Duragesic 75 mcg patches from pt and cleansed area where patches were. Family was unsure when patches were initially placed on pt. EMS attempted placement of nasal airway, but pt rejected attempt. EMS states pt currently has Resp 20, unlabored. Pt was able to transfer himself from gurney to bed. Pt alert, no acute distress. Skin warm/dry.

## 2014-12-26 NOTE — ED Notes (Addendum)
[facility] EDP given patient Mark [name] lactic result.

## 2014-12-26 NOTE — ED Provider Notes (Signed)
CSN: [id number]     Arrival date & time 12/26/14  2048 History   First MD Initiated Contact with Patient 12/26/14 2051     Chief Complaint  Patient presents with  . Altered Mental Status     (Consider location/radiation/quality/duration/timing/severity/associated sxs/prior Treatment) HPI  44 year old male with history of recurrent multiple myeloma currently undergoing chemotherapy and radiation brought here via EMS from home for unintentional fentanyl overdose. Patient with recurrent myeloma along L5 with associated pain and numbness. He takes fentanyl, Tylenol, and Percocet for pain. EMS report when they arrive, family were doing CPR on the patient. Patient had pinpoint pupils with shallow respiration per EMS. He did receive 2 mg of Narcan intranasally prior to arrival. EMS also removed two Duragesic 74mg patches from pt and cleansed area where the patches were.  Patient subsequently became more alert and able to communicate. He was able to transfer himself from gurney to bed. Additional history was obtained through wife. Per wife, patient complaining of increasing back and leg pain this morning and request to have 2 fentanyl patch placed for pain control. His wife was the one that placed the patches.  In the afternoon she went to wake him up for dinner but he was not arousable, snoring loudly. EMS was contacted. Wife mentioned patient has recent back surgery for his pain. He is also scheduled to start radiation next week. He is currently on weekly chemotherapy. She denies patient mentioning of any suicidal or homicidal thoughts. He does not use any recreational drugs or alcohol.  Patient is somnolent but arousable and able to answer question. Aside from chronic back and neck pain he denies having any other symptoms such as headache, neck stiffness, chest pain, shortness of breath, abdominal pain.   Past Medical History  Diagnosis Date  . History of radiation therapy 02/07/13- 02/26/13    lower L  spine, upper sacrum, 35 gray in 14 fractions  . FH: chemotherapy     Dr. PBurney Gauze . Cancer   . Multiple myeloma dx'd 01/2013   Past Surgical History  Procedure Laterality Date  . Portacath placement    . [facility]  [facility]  . Lumbar laminectomy/decompression microdiscectomy N/A 09/25/2014    Procedure: LUMBAR LAMINECTOMY/DECOMPRESSION MICRODISCECTOMY;  Surgeon: MSinclair Ship MD;  Location: [facility]  Service: Orthopedics;  Laterality: N/A;  Lumbar 4-5, L5-S1  decompression   Family History  Problem Relation Age of Onset  . Diabetic kidney disease Mother   . Hypertension Mother   . Heart attack Mother   . Kidney failure Mother   . Coronary artery disease Mother   . HIV Father    History  Substance Use Topics  . Smoking status: Former Smoker -- 1.00 packs/day for 15 years    Types: Cigarettes    Start date: 06/29/1988    Quit date: 06/29/2003  . Smokeless tobacco: Never Used     Comment: quit smoking 10 years ago  . Alcohol Use: No    Review of Systems  All other systems reviewed and are negative.     Allergies  Gadolinium derivatives  Home Medications   Prior to Admission medications   Medication Sig Start Date End Date Taking? Authorizing Provider  acetaminophen (TYLENOL) 500 MG tablet Take 500 mg by mouth every 6 (six) hours as needed for moderate pain or headache.    Historical Provider, MD  acyclovir (ZOVIRAX) 800 MG tablet Take 1  tablet (800 mg total) by mouth 2 (two) times daily. 06/06/14   [name], MD  aspirin EC 325 MG tablet Take 325 mg by mouth daily.    Historical Provider, MD  bisacodyl (DULCOLAX) 5 MG EC tablet Take 5 mg by mouth daily as needed for moderate constipation.    Historical Provider, MD  calcium gluconate 500 MG tablet Take 1 tablet by mouth daily.    Historical Provider, MD  fentaNYL (DURAGESIC - DOSED MCG/HR) 75 MCG/HR Place 1 patch (75 mcg total) onto the skin  every 3 (three) days. 12/09/14   [name], MD  gabapentin (NEURONTIN) 300 MG capsule Take 2 capsules (600 mg total) by mouth 3 (three) times daily. 11/20/14   [name], NP  lenalidomide (REVLIMID) 10 MG capsule Take 1 capsule (10 mg total) by mouth daily. Josem Kaufmann #[id number] 12/26/14   [name], MD  metoCLOPramide (REGLAN) 5 MG tablet Take 1 tablet (5 mg total) by mouth 3 (three) times daily before meals. Patient not taking: Reported on 12/25/2014 11/07/14   Barton Dubois, MD  ondansetron [facility] ODT) 8 MG disintegrating tablet Take 1 tablet (8 mg total) by mouth every 8 (eight) hours as needed for nausea or vomiting. Patient not taking: Reported on 12/25/2014 11/07/14   Barton Dubois, MD  oxyCODONE-acetaminophen (PERCOCET) 10-325 MG per tablet Take 1 tablet by mouth every 4 (four) hours as needed for pain. 08/28/14   [name], MD  pantoprazole (PROTONIX) 40 MG tablet Take 1 tablet (40 mg total) by mouth daily. Patient not taking: Reported on 12/25/2014 11/07/14   Barton Dubois, MD  prochlorperazine (COMPAZINE) 10 MG tablet Take 10 mg by mouth every 8 (eight) hours as needed for nausea or vomiting.    Historical Provider, MD  Tapentadol HCl (NUCYNTA) 75 MG TABS Take 1 tablet (75 mg total) by mouth every 4 (four) hours as needed. 12/09/14   [name], MD   BP 110/55 mmHg  Pulse 114  Temp(Src) 101.3 F (38.5 C) (Oral)  Resp 15  SpO2 90% Physical Exam  Constitutional: He appears well-developed and well-nourished.  African-American male, somnolent but arousable.  HENT:  Head: Atraumatic.  Mouth/Throat: Oropharynx is clear and moist.  Eyes: Conjunctivae are normal.  Pinpoint pupils  Neck: Neck supple.  No nuchal rigidity  Cardiovascular:  Tachycardia without murmurs or gallops  Pulmonary/Chest: Effort normal and breath sounds normal.  Abdominal: Soft. There is no tenderness.  Musculoskeletal:  No significant spine tenderness. Well-healing lumbar midline surgical scar  without evidence of infection.  Skin: No rash noted.  Psychiatric: His affect is blunt. His speech is delayed. He is slowed.  Nursing note and vitals reviewed.   ED Course  Procedures (including critical care time)  Patient with accidental overdose on fentanyl patch who was found comatose and unresponsive initially but improved after receiving 60m of intranasal Narcan.  He is currently somnolent but arousable. Workup initiated, will monitor closely.  10:45 PM Pt is still somnolent, additional Narcan given.  Patient appears to be dried, IV fluid given. After the additional dose of Narcan patient is more alert. He is now oriented, able to communicate and talking on the phone.  Elevated trop without acute ischemic changes on ECG.  Pt denies CP.  Evidence of renal insufficiency.    Will continue with hydration.  Care discussed with Dr. WMingo Amber 11:53 PM Appreciate consultation from [facility] Dr. PPosey Pronto who agrees to admit pt to tele, under his care.  Pt is now fully A&Ox4.  Pt is aware of plan.    Labs Review Labs Reviewed  CBC WITH DIFFERENTIAL/PLATELET - Abnormal; Notable for the following:    WBC 11.6 (*)    RBC 3.38 (*)    Hemoglobin 10.6 (*)    HCT 32.6 (*)    Platelets 121 (*)    Neutrophils Relative % 90 (*)    Neutro Abs 10.5 (*)    Lymphocytes Relative 4 (*)    Lymphs Abs 0.4 (*)    All other components within normal limits  I-STAT CHEM 8, ED - Abnormal; Notable for the following:    Chloride 100 (*)    Creatinine, Ser 1.60 (*)    Glucose, Bld 159 (*)    Hemoglobin 11.9 (*)    HCT 35.0 (*)    All other components within normal limits  I-STAT CG4 LACTIC ACID, ED - Abnormal; Notable for the following:    Lactic Acid, Venous 2.53 (*)    All other components within normal limits  I-STAT TROPOININ, ED - Abnormal; Notable for the following:    Troponin i, poc 0.12 (*)    All other components within normal limits  COMPREHENSIVE METABOLIC PANEL  I-STAT TROPOININ, ED     Imaging Review No results found.   EKG Interpretation   Date/Time:  Friday Dec 26 2014 20:55:51 EDT Ventricular Rate:  115 PR Interval:  149 QRS Duration: 93 QT Interval:  307 QTC Calculation: 425 R Axis:   70 Text Interpretation:  Sinus tachycardia Borderline T abnormalities,  inferior leads No significant change was found Confirmed by Mingo Amber  MD,  [place] ([id number]) on 12/26/2014 9:03:47 PM      MDM   Final diagnoses:  Accidental fentanyl overdose, initial encounter  Elevated troponin  Dehydration    BP 129/71 mmHg  Pulse 91  Temp(Src) 101.3 F (38.5 C) (Oral)  Resp 16  SpO2 98% Elevated temp, no complaints of infection.  Will recheck temperature.     Domenic Moras, PA-C 12/26/14 2354  Evelina Bucy, MD 12/27/14 [phone]

## 2014-12-26 NOTE — ED Notes (Signed)
Gertie Fey, [place] and [place] EDP made aware of patient i-stat troponin result.

## 2014-12-27 ENCOUNTER — Observation Stay (HOSPITAL_BASED_OUTPATIENT_CLINIC_OR_DEPARTMENT_OTHER): Payer: BLUE CROSS/BLUE SHIELD

## 2014-12-27 DIAGNOSIS — R7989 Other specified abnormal findings of blood chemistry: Secondary | ICD-10-CM | POA: Diagnosis not present

## 2014-12-27 DIAGNOSIS — N182 Chronic kidney disease, stage 2 (mild): Secondary | ICD-10-CM | POA: Diagnosis not present

## 2014-12-27 DIAGNOSIS — R778 Other specified abnormalities of plasma proteins: Secondary | ICD-10-CM | POA: Diagnosis present

## 2014-12-27 DIAGNOSIS — C9000 Multiple myeloma not having achieved remission: Secondary | ICD-10-CM

## 2014-12-27 DIAGNOSIS — R55 Syncope and collapse: Secondary | ICD-10-CM | POA: Diagnosis not present

## 2014-12-27 DIAGNOSIS — T404X1A Poisoning by other synthetic narcotics, accidental (unintentional), initial encounter: Secondary | ICD-10-CM

## 2014-12-27 DIAGNOSIS — G629 Polyneuropathy, unspecified: Secondary | ICD-10-CM

## 2014-12-27 DIAGNOSIS — E860 Dehydration: Secondary | ICD-10-CM | POA: Diagnosis not present

## 2014-12-27 LAB — COMPREHENSIVE METABOLIC PANEL
ALBUMIN: 4.1 g/dL (ref 3.5–5.0)
ALT: 28 U/L (ref 17–63)
ALT: 30 U/L (ref 17–63)
ANION GAP: 9 (ref 5–15)
AST: 21 U/L (ref 15–41)
AST: 24 U/L (ref 15–41)
Albumin: 4.2 g/dL (ref 3.5–5.0)
Alkaline Phosphatase: 50 U/L (ref 38–126)
Alkaline Phosphatase: 55 U/L (ref 38–126)
Anion gap: 8 (ref 5–15)
BILIRUBIN TOTAL: 1.5 mg/dL — AB (ref 0.3–1.2)
BUN: 13 mg/dL (ref 6–20)
BUN: 13 mg/dL (ref 6–20)
CALCIUM: 8.1 mg/dL — AB (ref 8.9–10.3)
CALCIUM: 8.3 mg/dL — AB (ref 8.9–10.3)
CO2: 28 mmol/L (ref 22–32)
CO2: 28 mmol/L (ref 22–32)
Chloride: 103 mmol/L (ref 101–111)
Chloride: 104 mmol/L (ref 101–111)
Creatinine, Ser: 1.34 mg/dL — ABNORMAL HIGH (ref 0.61–1.24)
Creatinine, Ser: 1.45 mg/dL — ABNORMAL HIGH (ref 0.61–1.24)
GFR calc Af Amer: >60 mL/min (ref >60)
GFR calc Af Amer: >60 mL/min (ref >60)
GFR, EST NON AFRICAN AMERICAN: 58 mL/min — AB (ref >60)
GLUCOSE: 114 mg/dL — AB (ref 65–99)
Glucose, Bld: 105 mg/dL — ABNORMAL HIGH (ref 65–99)
Potassium: 4 mmol/L (ref 3.5–5.1)
Potassium: 4 mmol/L (ref 3.5–5.1)
SODIUM: 139 mmol/L (ref 135–145)
Sodium: 141 mmol/L (ref 135–145)
TOTAL PROTEIN: 6.9 g/dL (ref 6.5–8.1)
TOTAL PROTEIN: 7.3 g/dL (ref 6.5–8.1)
Total Bilirubin: 1.2 mg/dL (ref 0.3–1.2)

## 2014-12-27 LAB — CBC
HCT: 30.8 % — ABNORMAL LOW (ref 39.0–52.0)
Hemoglobin: 9.9 g/dL — ABNORMAL LOW (ref 13.0–17.0)
MCH: 31 pg (ref 26.0–34.0)
MCHC: 32.1 g/dL (ref 30.0–36.0)
MCV: 96.6 fL (ref 78.0–100.0)
PLATELETS: 118 10*3/uL — AB (ref 150–400)
RBC: 3.19 MIL/uL — ABNORMAL LOW (ref 4.22–5.81)
RDW: 13.5 % (ref 11.5–15.5)
WBC: 8.2 10*3/uL (ref 4.0–10.5)

## 2014-12-27 LAB — TROPONIN I
TROPONIN I: 0.3 ng/mL — AB (ref <0.031)
Troponin I: 0.14 ng/mL — ABNORMAL HIGH (ref <0.031)

## 2014-12-27 LAB — I-STAT TROPONIN, ED: TROPONIN I, POC: 0.19 ng/mL — AB (ref 0.00–0.08)

## 2014-12-27 MED ORDER — ACETAMINOPHEN 325 MG PO TABS
650.0000 mg | ORAL_TABLET | Freq: Four times a day (QID) | ORAL | Status: DC | PRN
  Administered 2014-12-27 (×2): 650 mg via ORAL
  Filled 2014-12-27 (×2): qty 2

## 2014-12-27 MED ORDER — CARVEDILOL 3.125 MG PO TABS
3.1250 mg | ORAL_TABLET | Freq: Two times a day (BID) | ORAL | Status: DC
  Administered 2014-12-27: 3.125 mg via ORAL
  Filled 2014-12-27 (×3): qty 1

## 2014-12-27 MED ORDER — LENALIDOMIDE 10 MG PO CAPS: 10.0000 mg | ORAL_CAPSULE | Freq: Every day | ORAL | Status: DC

## 2014-12-27 MED ORDER — ONDANSETRON HCL 4 MG/2ML IJ SOLN
4.0000 mg | Freq: Four times a day (QID) | INTRAMUSCULAR | Status: DC | PRN
  Administered 2014-12-27 (×2): 4 mg via INTRAVENOUS
  Filled 2014-12-27 (×2): qty 2

## 2014-12-27 MED ORDER — SODIUM CHLORIDE 0.9 % IV SOLN: INTRAVENOUS | Status: DC

## 2014-12-27 MED ORDER — ASPIRIN EC 325 MG PO TBEC
325.0000 mg | DELAYED_RELEASE_TABLET | Freq: Every day | ORAL | Status: DC
Start: 2014-12-27 — End: 2014-12-27
  Filled 2014-12-27: qty 1

## 2014-12-27 MED ORDER — SODIUM CHLORIDE 0.9 % IJ SOLN: 3.0000 mL | Freq: Two times a day (BID) | INTRAMUSCULAR | Status: DC

## 2014-12-27 MED ORDER — GABAPENTIN 300 MG PO CAPS
600.0000 mg | ORAL_CAPSULE | Freq: Three times a day (TID) | ORAL | Status: DC
  Filled 2014-12-27 (×3): qty 2

## 2014-12-27 MED ORDER — ACYCLOVIR 800 MG PO TABS
800.0000 mg | ORAL_TABLET | Freq: Two times a day (BID) | ORAL | Status: DC
  Filled 2014-12-27 (×2): qty 1

## 2014-12-27 MED ORDER — ONDANSETRON HCL 4 MG PO TABS: 4.0000 mg | ORAL_TABLET | Freq: Four times a day (QID) | ORAL | Status: DC | PRN

## 2014-12-27 MED ORDER — HEPARIN SODIUM (PORCINE) 5000 UNIT/ML IJ SOLN
5000.0000 [IU] | Freq: Three times a day (TID) | INTRAMUSCULAR | Status: DC
Start: 2014-12-27 — End: 2014-12-27
  Filled 2014-12-27 (×4): qty 1

## 2014-12-27 NOTE — Progress Notes (Signed)
Pt discharged.  Denies pain.  Room air.  No complaints.  Wife picking up pt.  Respirations even and unlabored.

## 2014-12-27 NOTE — Discharge Summary (Signed)
Mark [name], is a 44 y.o. male  DOB [birth year]  MRN [id number].  Admission date:  12/26/2014  Admitting Physician  Lavina Hamman, MD  Discharge Date:  12/27/2014   Primary MD  No PCP Per Patient  Recommendations for primary care physician for things to follow:   Monitor narcotic intake closely  Repeat CBC BMP next visit.   Admission Diagnosis  Dehydration [E86.0] Elevated troponin [R79.89] Accidental fentanyl overdose, initial encounter [T40.4X1A]   Discharge Diagnosis  Dehydration [E86.0] Elevated troponin [R79.89] Accidental fentanyl overdose, initial encounter [T40.4X1A]    Principal Problem:   Accidental fentanyl overdose Active Problems:   Multiple myeloma   Chronic kidney disease (CKD), stage II (mild)   Neuropathy   Elevated troponin      Past Medical History  Diagnosis Date  . History of radiation therapy 02/07/13- 02/26/13    lower L spine, upper sacrum, 35 gray in 14 fractions  . FH: chemotherapy     Dr. Burney Gauze  . Cancer   . Multiple myeloma dx'd 01/2013    Past Surgical History  Procedure Laterality Date  . Portacath placement    . 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 Kyphoplasty  05/20/14  . Lumbar laminectomy/decompression microdiscectomy N/A 09/25/2014    Procedure: LUMBAR LAMINECTOMY/DECOMPRESSION MICRODISCECTOMY;  Surgeon: Sinclair Ship, MD;  Location: [place];  Service: Orthopedics;  Laterality: N/A;  Lumbar 4-5, L5-S1  decompression       History of present illness and  Hospital Course:     Kindly see H&P for history of present illness and admission details, please review complete Labs, Consult reports and Test reports for all details in brief  HPI  from the history and physical done on the day of admission  Mark [name] is a 44 y.o. male  with Past medical history of multiple myeloma, chronic back pain on multiple narcotic pain medications, neuropathy secondary to chemotherapy, laminectomy.  The patient was brought in secondary to an unresponsive episode.Patient mentions that he recently has undergone radiation oncology initial screening, and since then he has been having worsening of his chronic back pain. He has tried his routine home medication without any benefit. A urine on the morning of 12/26/2014 he applied an extra patch of fentanyl on top of his 75 micrograms fentanyl patch, so total 150 g fentanyl patch. And then around 10:00 he had an episode of vomiting so his family member requested him to lie down and take some rest.  That is a las thing that the patient is remembering and then when the family member came back from work this found that the patient was not responding and called EMS.  They started CPR on the patient and may have done CPR for roughly 1 minute although EMS found that the patient has decent pulses. Patient had pinpoint pupils and therefore patient was given Narcan with which she was significantly awake and he was given a dose of Narcan in the ER and at the time  of my evaluation the patient felt at his baseline.  She denies any complaint of chest pain shortness of breath heaviness chest tightness fever chills nausea vomiting abdominal pain or back pain or any focal deficit.  The patient is coming from home. And at his baseline independent for most of his ADL.   Hospital Course    1. Accidental unresponsive episode with no evidence of loss of pulse. This happened due to accidental narcotic overdose, upon EMS arrival he had good pulses and vital signs, he was unresponsive with pinpoint pupil after accidental narcotic overdose. Wife administered 1 minute of CPR as she came unresponsive. He responded very well to Narcan given by EMS. Completely back to baseline, has been advised not to overuse narcotics and use  only as instructed by prescribing physician. He is back to baseline he grew to go home.   2. Multiple myeloma with back pain. Follow with PCP, oncology and neurosurgery. He is due for radiation treatments per patient.   3. Mildly elevated troponin after CPR provided at home. No chest pain, this is not ACS, EKG unremarkable, troponin trend not consistent with ACS. Was given one dose of aspirin and beta blocker here. He is on aspirin at home and will resume. No acute issues. Echo as below.   4. GERD. Continue Protonix.   5. 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 Continue Neurontin.   6. 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 Stable monitor outpatient.     Discharge Condition: Stable   Follow UP  Follow-up Information    Follow up with Your Primary MD and Oncologist. Schedule an appointment as soon as possible for a visit in 3 days.        Discharge Instructions  and  Discharge Medications      Discharge Instructions    Diet - low sodium heart healthy    Complete by:  As directed      Discharge instructions    Complete by:  As directed   Follow with Primary MD No PCP Per Patient in 7 days   Get CBC, CMP, 2 view Chest X ray checked  by Primary MD next visit.    Activity: As tolerated with Full fall precautions use walker/cane & assistance as needed   Disposition Home     Diet: Heart Healthy   For Heart failure patients - Check your Weight same time everyday, if you gain over 2 pounds, or you develop in leg swelling, experience more shortness of breath or chest pain, call your Primary MD immediately. Follow Cardiac Low Salt Diet and 1.5 lit/day fluid restriction.   On your next visit with your primary care physician please Get Medicines reviewed and adjusted.   Please request your Prim.MD to go over all Hospital Tests and Procedure/Radiological results at the follow up, please get all Hospital records sent to your Prim MD by signing hospital release  before you go home.   If you experience worsening of your admission symptoms, develop shortness of breath, life threatening emergency, suicidal or homicidal thoughts you must seek medical attention immediately by calling 911 or calling your MD immediately  if symptoms less severe.  You Must read complete instructions/literature along with all the possible adverse reactions/side effects for all the Medicines you take and that have been prescribed to you. Take any new Medicines after you have completely understood and accpet all the possible adverse reactions/side effects.   Do not drive, operating heavy machinery, perform activities at heights, swimming or  participation in water activities or provide baby sitting services if your were admitted for syncope or siezures until you have seen by Primary MD or a Neurologist and advised to do so again.  Do not drive when taking Pain medications.    Do not take more than prescribed Pain, Sleep and Anxiety Medications  Special Instructions: If you have smoked or chewed Tobacco  in the last 2 yrs please stop smoking, stop any regular Alcohol  and or any Recreational drug use.  Wear Seat belts while driving.   Please note  You were cared for by a hospitalist during your hospital stay. If you have any questions about your discharge medications or the care you received while you were in the hospital after you are discharged, you can call the unit and asked to speak with the hospitalist on call if the hospitalist that took care of you is not available. Once you are discharged, your primary care physician will handle any further medical issues. Please note that NO REFILLS for any discharge medications will be authorized once you are discharged, as it is imperative that you return to your primary care physician (or establish a relationship with a primary care physician if you do not have one) for your aftercare needs so that they can reassess your need for  medications and monitor your lab values.     Increase activity slowly    Complete by:  As directed             Medication List    TAKE these medications        acetaminophen 500 MG tablet  Commonly known as:  TYLENOL  Take 1,000 mg by mouth every 6 (six) hours as needed for moderate pain or headache (headache).     acyclovir 800 MG tablet  Commonly known as:  ZOVIRAX  Take 1 tablet (800 mg total) by mouth 2 (two) times daily.     aspirin EC 325 MG tablet  Take 325 mg by mouth daily.     bisacodyl 5 MG EC tablet  Commonly known as:  DULCOLAX  Take 5 mg by mouth daily as needed for moderate constipation (constipation).     calcium gluconate 500 MG tablet  Take 1 tablet by mouth daily.     fentaNYL 75 MCG/HR  Commonly known as:  DURAGESIC - dosed mcg/hr  Place 1 patch (75 mcg total) onto the skin every 3 (three) days.     gabapentin 300 MG capsule  Commonly known as:  NEURONTIN  Take 2 capsules (600 mg total) by mouth 3 (three) times daily.     lenalidomide 10 MG capsule  Commonly known as:  REVLIMID  Take 1 capsule (10 mg total) by mouth daily. Auth #[id number]     metoCLOPramide 5 MG tablet  Commonly known as:  REGLAN  Take 1 tablet (5 mg total) by mouth 3 (three) times daily before meals.     ondansetron 8 MG disintegrating tablet  Commonly known as:  ZOFRAN ODT  Take 1 tablet (8 mg total) by mouth every 8 (eight) hours as needed for nausea or vomiting.     oxyCODONE-acetaminophen 10-325 MG per tablet  Commonly known as:  PERCOCET  Take 1 tablet by mouth every 4 (four) hours as needed for pain.     pantoprazole 40 MG tablet  Commonly known as:  PROTONIX  Take 1 tablet (40 mg total) by mouth daily.     prochlorperazine 10 MG tablet  Commonly known  as:  COMPAZINE  Take 10 mg by mouth every 8 (eight) hours as needed for nausea or vomiting.     Tapentadol HCl 75 MG Tabs  Commonly known as:  NUCYNTA  Take 1 tablet (75 mg total) by mouth every 4 (four) hours as  needed.          Diet and Activity recommendation: See Discharge Instructions above   Consults obtained - none   Major procedures and Radiology Reports - PLEASE review detailed and final reports for all details, in brief -    TTE  - Left ventricle: The cavity size was normal. There was mild concentric hypertrophy. Systolic function was vigorous. The estimated ejection fraction was in the range of 65% to 70%. Wall motion was normal; there were no regional wall motion abnormalities. The study is not technically sufficient to allow evaluation of LV diastolic function.  Impressions:  - This is limited echocardiogram without use of Doppler.There is normal chamber size and normal biventricular function.   Micro Results     No results found for this or any previous visit (from the past 240 hour(s)).     Today   Subjective:   Mark [name] today has no headache,no chest abdominal pain,no new weakness tingling or numbness, feels much better wants to go home today.    Objective:   Blood pressure 113/64, pulse 81, temperature 98.6 F (37 C), temperature source Oral, resp. rate 16, height _0  (1.803 m), weight 99.338 kg (219 lb), SpO2 100 %.   Intake/Output Summary (Last 24 hours) at 12/27/14 1129 Last data filed at 12/27/14 0600  Gross per 24 hour  Intake    540 ml  Output      0 ml  Net    540 ml    Exam Awake Alert, Oriented x 3, No new F.N deficits, Normal affect [place].AT,PERRAL Supple Neck,No JVD, No cervical lymphadenopathy appriciated.  Symmetrical Chest wall movement, Good air movement bilaterally, CTAB RRR,No Gallops,Rubs or new Murmurs, No Parasternal Heave +ve B.Sounds, Abd Soft, Non tender, No organomegaly appriciated, No rebound -guarding or rigidity. No Cyanosis, Clubbing or edema, No new Rash or bruise  Data Review   CBC w Diff: Lab Results  Component Value Date   WBC 8.2 12/27/2014   WBC 2.2* 12/09/2014   WBC 5.7 02/21/2013   HGB 9.9*  12/27/2014   HGB 11.5* 12/09/2014   HGB 11.3* 02/21/2013   HCT 30.8* 12/27/2014   HCT 34.2* 12/09/2014   HCT 33.2* 02/21/2013   PLT 118* 12/27/2014   PLT 155 12/09/2014   PLT 169 02/21/2013   LYMPHOPCT 4* 12/26/2014   LYMPHOPCT 36.0 12/09/2014   LYMPHOPCT 8.6* 02/21/2013   MONOPCT 6 12/26/2014   MONOPCT 15.8* 12/09/2014   MONOPCT 7.7 02/21/2013   EOSPCT 0 12/26/2014   EOSPCT 5.9 12/09/2014   EOSPCT 1.2 02/21/2013   BASOPCT 0 12/26/2014   BASOPCT 0.5 12/09/2014   BASOPCT 0.0 02/21/2013    CMP: Lab Results  Component Value Date   NA 139 12/27/2014   NA 137 12/09/2014   NA 145 02/21/2013   K 4.0 12/27/2014   K 4.2 12/09/2014   K 3.8 02/21/2013   CL 103 12/27/2014   CL 103 12/09/2014   CO2 28 12/27/2014   CO2 30 12/09/2014   CO2 32* 02/21/2013   BUN 13 12/27/2014   BUN 15 12/09/2014   BUN 19.5 02/21/2013   CREATININE 1.45* 12/27/2014   CREATININE 1.3* 12/09/2014   CREATININE 1.5* 02/21/2013  PROT 6.9 12/27/2014   PROT 7.4 12/09/2014   ALBUMIN 4.1 12/27/2014   BILITOT 1.2 12/27/2014   BILITOT 1.60 12/09/2014   ALKPHOS 50 12/27/2014   ALKPHOS 56 12/09/2014   AST 21 12/27/2014   AST 23 12/09/2014   ALT 28 12/27/2014   ALT 31 12/09/2014  .   Total Time in preparing paper work, data evaluation and todays exam - 35 minutes  Thurnell Lose M.D on 12/27/2014 at 11:29 AM  [facility]   Office  [phone]

## 2014-12-27 NOTE — Progress Notes (Signed)
  Echocardiogram 2D Echocardiogram has been performed.  Donata Clay 12/27/2014, 10:36 AM

## 2014-12-27 NOTE — H&P (Signed)
[facility] History and Physical  Patient: Mark [name]  MRN: [id number]  DOB: 1970/11/05  DOS: the patient was seen and examined on 12/27/2014 PCP: No PCP Per Patient  Referring physician: Dr. Mingo Amber Chief Complaint: Unresponsive episode  HPI: Mark [name] is a 44 y.o. male with Past medical history of multiple myeloma, chronic back pain on multiple narcotic pain medications, neuropathy secondary to chemotherapy, laminectomy. The patient was brought in secondary to an unresponsive episode. Patient mentions that he recently has undergone radiation oncology initial screening, and since then he has been having worsening of his chronic back pain. He has tried his routine home medication without any benefit. A urine on the morning of 12/26/2014 he applied an extra patch of fentanyl on top of his 75 micrograms fentanyl patch, so total 150 g fentanyl patch. And then around 10:00 he had an episode of vomiting so his family member requested him to lie down and take some rest. That is a lasting that the patient is remembering and then when the family member came back from work this found that the patient was not responding and called EMS. The started CPR on the patient and may have done CPR for roughly 1 minute although EMS found that the patient has decent pulses. Patient had pinpoint pupils and therefore patient was given Narcan with which she was significantly awake and he was given a dose of Narcan in the ER and at the time of my evaluation the patient felt at his baseline. She denies any complaint of chest pain shortness of breath heaviness chest tightness fever chills nausea vomiting abdominal pain or back pain or any focal deficit.  The patient is coming from home. And at his baseline independent for most of his ADL.  Review of Systems: as mentioned in the history of present illness.  A comprehensive review of the other systems is negative.  Past Medical History  Diagnosis Date  .  History of radiation therapy 02/07/13- 02/26/13    lower L spine, upper sacrum, 35 gray in 14 fractions  . FH: chemotherapy     Dr. Burney Gauze  . Cancer   . Multiple myeloma dx'd 01/2013   Past Surgical History  Procedure Laterality Date  . Portacath placement    . 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 Kyphoplasty  05/20/14  . Lumbar laminectomy/decompression microdiscectomy N/A 09/25/2014    Procedure: LUMBAR LAMINECTOMY/DECOMPRESSION MICRODISCECTOMY;  Surgeon: Sinclair Ship, MD;  Location: [facility];  Service: Orthopedics;  Laterality: N/A;  Lumbar 4-5, L5-S1  decompression   Social History:  reports that he quit smoking about 11 years ago. His smoking use included Cigarettes. He started smoking about 26 years ago. He has a 15 pack-year smoking history. He has never used smokeless tobacco. He reports that he does not drink alcohol or use illicit drugs.  Allergies  Allergen Reactions  . Gadolinium Derivatives Nausea And Vomiting    Pt became very nauseous and got sick.     Family History  Problem Relation Age of Onset  . Diabetic kidney disease Mother   . Hypertension Mother   . Heart attack Mother   . Kidney failure Mother   . Coronary artery disease Mother   . HIV Father     Prior to Admission medications   Medication Sig Start Date End Date Taking? Authorizing Provider  acetaminophen (TYLENOL) 500 MG tablet Take 1,000 mg by mouth every 6 (six) hours as needed for moderate pain  or headache (headache).    Yes Historical Provider, MD  acyclovir (ZOVIRAX) 800 MG tablet Take 1 tablet (800 mg total) by mouth 2 (two) times daily. 06/06/14  Yes [name], MD  aspirin EC 325 MG tablet Take 325 mg by mouth daily.   Yes Historical Provider, MD  bisacodyl (DULCOLAX) 5 MG EC tablet Take 5 mg by mouth daily as needed for moderate constipation (constipation).    Yes Historical Provider, MD  calcium gluconate 500 MG tablet Take 1 tablet by mouth daily.   Yes  Historical Provider, MD  fentaNYL (DURAGESIC - DOSED MCG/HR) 75 MCG/HR Place 1 patch (75 mcg total) onto the skin every 3 (three) days. 12/09/14  Yes [name], MD  gabapentin (NEURONTIN) 300 MG capsule Take 2 capsules (600 mg total) by mouth 3 (three) times daily. 11/20/14  Yes [name], NP  lenalidomide (REVLIMID) 10 MG capsule Take 1 capsule (10 mg total) by mouth daily. Josem Kaufmann #[id number] 12/26/14  Yes [name], MD  oxyCODONE-acetaminophen (PERCOCET) 10-325 MG per tablet Take 1 tablet by mouth every 4 (four) hours as needed for pain. 08/28/14  Yes [name], MD  Tapentadol HCl (NUCYNTA) 75 MG TABS Take 1 tablet (75 mg total) by mouth every 4 (four) hours as needed. 12/09/14  Yes [name], MD  metoCLOPramide (REGLAN) 5 MG tablet Take 1 tablet (5 mg total) by mouth 3 (three) times daily before meals. 11/07/14   Barton Dubois, MD  ondansetron (ZOFRAN ODT) 8 MG disintegrating tablet Take 1 tablet (8 mg total) by mouth every 8 (eight) hours as needed for nausea or vomiting. 11/07/14   Barton Dubois, MD  pantoprazole (PROTONIX) 40 MG tablet Take 1 tablet (40 mg total) by mouth daily. Patient not taking: Reported on 12/25/2014 11/07/14   Barton Dubois, MD  prochlorperazine (COMPAZINE) 10 MG tablet Take 10 mg by mouth every 8 (eight) hours as needed for nausea or vomiting.    Historical Provider, MD    Physical Exam: Filed Vitals:   12/27/14 0000 12/27/14 0030 12/27/14 0100 12/27/14 0611  BP: 130/75 113/50 109/68 113/64  Pulse: 91 94 94 81  Temp: 98.8 F (37.1 C)  99.1 F (37.3 C) 98.6 F (37 C)  TempSrc: Oral  Oral Oral  Resp: '11 9 16 16  ' Height:   '5\' 11"'  (1.803 m)   Weight:   99.338 kg (219 lb)   SpO2: 100% 100% 100% 100%    General: Alert, Awake and Oriented to Time, Place and Person. Appear in mild distress Eyes: PERRL ENT: Oral Mucosa clear moist. Neck: no JVD Cardiovascular: S1 and S2 Present, no Murmur, Peripheral Pulses Present Respiratory: Bilateral Air entry  equal and Decreased,  Clear to Auscultation, no Crackles, no wheezes Abdomen: Bowel Sound present, Soft and non tender Skin: no Rash Extremities: no Pedal edema, no calf tenderness Neurologic: Grossly no focal neuro deficit.  Labs on Admission:  CBC:  Recent Labs Lab 12/26/14 2148 12/26/14 2201 12/27/14 0210  WBC 11.6*  --  8.2  NEUTROABS 10.5*  --   --   HGB 10.6* 11.9* 9.9*  HCT 32.6* 35.0* 30.8*  MCV 96.4  --  96.6  PLT 121*  --  118*    CMP     Component Value Date/Time   NA 139 12/27/2014 0210   NA 137 12/09/2014 1113   NA 145 02/21/2013 0908   K 4.0 12/27/2014 0210   K 4.2 12/09/2014 1113   K 3.8  02/21/2013 0908   CL 103 12/27/2014 0210   CL 103 12/09/2014 1113   CO2 28 12/27/2014 0210   CO2 30 12/09/2014 1113   CO2 32* 02/21/2013 0908   GLUCOSE 114* 12/27/2014 0210   GLUCOSE 140* 12/09/2014 1113   GLUCOSE 115 02/21/2013 0908   BUN 13 12/27/2014 0210   BUN 15 12/09/2014 1113   BUN 19.5 02/21/2013 0908   CREATININE 1.45* 12/27/2014 0210   CREATININE 1.3* 12/09/2014 1113   CREATININE 1.5* 02/21/2013 0908   CALCIUM 8.1* 12/27/2014 0210   CALCIUM 9.2 12/09/2014 1113   CALCIUM 17.7 Repeated and Verified* 02/21/2013 0908   CALCIUM >15.0* 02/01/2013 1230   PROT 6.9 12/27/2014 0210   PROT 7.4 12/09/2014 1113   ALBUMIN 4.1 12/27/2014 0210   AST 21 12/27/2014 0210   AST 23 12/09/2014 1113   ALT 28 12/27/2014 0210   ALT 31 12/09/2014 1113   ALKPHOS 50 12/27/2014 0210   ALKPHOS 56 12/09/2014 1113   BILITOT 1.2 12/27/2014 0210   BILITOT 1.60 12/09/2014 1113   GFRNONAA 58* 12/27/2014 0210   GFRAA >60 12/27/2014 0210    No results for input(s): LIPASE, AMYLASE in the last 168 hours.   Recent Labs Lab 12/27/14 0210  TROPONINI 0.30*   BNP (last 3 results) No results for input(s): BNP in the last 8760 hours.  ProBNP (last 3 results) No results for input(s): PROBNP in the last 8760 hours.   Radiological Exams on Admission: No results found. EKG:  Independently reviewed. normal sinus rhythm, nonspecific ST and T waves changes.  Assessment/Plan Principal Problem:   Accidental fentanyl overdose Active Problems:   Multiple myeloma   Chronic kidney disease (CKD), stage II (mild)   Neuropathy   Elevated troponin   1. Accidental fentanyl overdose The patient is presenting with an accidental fentanyl overdose. Patient's past have been removed at present. Patient's pupils are still pin-point the time of my evaluation, although the patient is awake and oriented and oriented. Patient will be monitored on telemetry. Gentle IV hydration. Gradual resumption of his chronic pain management to avoid any ovoid withdrawal.  2.Elevated troponin. Most like is secondary to CPR. Since trend is upward I would continue monitoring his serial troponin and up in a limited echocardiogram. Continue aspirin.  3.Chronic neuropathy. Continue gabapentin.  4.Multiple myeloma as well as chronic kidney disease. Continue close monitoring.  Advance goals of care discussion: Full code   DVT Prophylaxis: subcutaneous Heparin Nutrition: Nothing by mouth after midnight  Family Communication: family was present at bedside, opportunity was given to ask question and all questions were answered satisfactorily at the time of interview. Disposition: Admitted as observation, telemetry unit.  Author: Berle Mull, MD [facility] Pager: [phone] 12/27/2014  If 7PM-7AM, please contact night-coverage [email or web address] Password [email or web address]

## 2014-12-27 NOTE — Discharge Instructions (Signed)
Follow with Primary MD No PCP Per Patient in 7 days   Get CBC, CMP, 2 view Chest X ray checked  by Primary MD next visit.    Activity: As tolerated with Full fall precautions use walker/cane & assistance as needed   Disposition Home     Diet: Heart Healthy   For Heart failure patients - Check your Weight same time everyday, if you gain over 2 pounds, or you develop in leg swelling, experience more shortness of breath or chest pain, call your Primary MD immediately. Follow Cardiac Low Salt Diet and 1.5 lit/day fluid restriction.   On your next visit with your primary care physician please Get Medicines reviewed and adjusted.   Please request your Prim.MD to go over all Hospital Tests and Procedure/Radiological results at the follow up, please get all Hospital records sent to your Prim MD by signing hospital release before you go home.   If you experience worsening of your admission symptoms, develop shortness of breath, life threatening emergency, suicidal or homicidal thoughts you must seek medical attention immediately by calling 911 or calling your MD immediately  if symptoms less severe.  You Must read complete instructions/literature along with all the possible adverse reactions/side effects for all the Medicines you take and that have been prescribed to you. Take any new Medicines after you have completely understood and accpet all the possible adverse reactions/side effects.   Do not drive, operating heavy machinery, perform activities at heights, swimming or participation in water activities or provide baby sitting services if your were admitted for syncope or siezures until you have seen by Primary MD or a Neurologist and advised to do so again.  Do not drive when taking Pain medications.    Do not take more than prescribed Pain, Sleep and Anxiety Medications  Special Instructions: If you have smoked or chewed Tobacco  in the last 2 yrs please stop smoking, stop any regular  Alcohol  and or any Recreational drug use.  Wear Seat belts while driving.   Please note  You were cared for by a hospitalist during your hospital stay. If you have any questions about your discharge medications or the care you received while you were in the hospital after you are discharged, you can call the unit and asked to speak with the hospitalist on call if the hospitalist that took care of you is not available. Once you are discharged, your primary care physician will handle any further medical issues. Please note that NO REFILLS for any discharge medications will be authorized once you are discharged, as it is imperative that you return to your primary care physician (or establish a relationship with a primary care physician if you do not have one) for your aftercare needs so that they can reassess your need for medications and monitor your lab values.

## 2014-12-30 DIAGNOSIS — C9000 Multiple myeloma not having achieved remission: Secondary | ICD-10-CM | POA: Diagnosis not present

## 2014-12-30 NOTE — Progress Notes (Signed)
  Radiation Oncology         [phone]) [phone] ________________________________  Name: Jeff Frieden MRN: [id number]  Date: 12/25/2014  DOB: [birth year]  SIMULATION AND TREATMENT PLANNING NOTE    ICD-9-CM ICD-10-CM   1. Multiple myeloma 203.00 C90.00     DIAGNOSIS:  Light chain multiple myeloma  NARRATIVE:  The patient was brought to the [facility].  Identity was confirmed.  All relevant records and images related to the planned course of therapy were reviewed.  The patient freely provided informed written consent to proceed with treatment after reviewing the details related to the planned course of therapy. The consent form was witnessed and verified by the simulation staff.  Then, the patient was set-up in a stable reproducible  supine position for radiation therapy.  CT images were obtained.  Surface markings were placed.  The CT images were loaded into the planning software.  Then the target and avoidance structures were contoured.  Treatment planning then occurred.  The radiation prescription was entered and confirmed.  Then, I designed and supervised the construction of a total of 1 medically necessary complex treatment devices.  I have requested : Intensity Modulated Radiotherapy (IMRT) is medically necessary for this case for the following reason:  Previous treatment to this area..  I have ordered:dose calc.  PLAN:  The patient will receive 20 Gy in 10 fractions.  ________________________________   Special treatment procedure note  Patient has had previous radiation therapy to the lower lumbar spine and upper sacrum area. Additional time was taken in reviewing the patient's previous treatment as it relates to his current set up. Given the increased time and potential for toxicities with overlapping fields, this constitutes a special treatment procedure -----------------------------------  Blair Promise, PhD, MD

## 2014-12-31 ENCOUNTER — Other Ambulatory Visit: Payer: Self-pay | Admitting: *Deleted

## 2014-12-31 DIAGNOSIS — C9000 Multiple myeloma not having achieved remission: Secondary | ICD-10-CM

## 2015-01-01 ENCOUNTER — Encounter: Payer: Self-pay | Admitting: Hematology & Oncology

## 2015-01-01 ENCOUNTER — Other Ambulatory Visit (HOSPITAL_BASED_OUTPATIENT_CLINIC_OR_DEPARTMENT_OTHER): Payer: BLUE CROSS/BLUE SHIELD

## 2015-01-01 ENCOUNTER — Ambulatory Visit (HOSPITAL_BASED_OUTPATIENT_CLINIC_OR_DEPARTMENT_OTHER): Payer: BLUE CROSS/BLUE SHIELD

## 2015-01-01 ENCOUNTER — Ambulatory Visit
Admission: RE | Admit: 2015-01-01 | Discharge: 2015-01-01 | Disposition: A | Discharge status: Home / Self Care | Payer: BLUE CROSS/BLUE SHIELD | Source: Ambulatory Visit | Attending: Radiation Oncology | Admitting: Radiation Oncology

## 2015-01-01 ENCOUNTER — Ambulatory Visit (HOSPITAL_BASED_OUTPATIENT_CLINIC_OR_DEPARTMENT_OTHER): Payer: BLUE CROSS/BLUE SHIELD | Admitting: Hematology & Oncology

## 2015-01-01 VITALS — BP 138/95 | HR 83 | Temp 98.4°F | Resp 20 | Ht 70.0 in | Wt 215.0 lb

## 2015-01-01 DIAGNOSIS — C9000 Multiple myeloma not having achieved remission: Secondary | ICD-10-CM

## 2015-01-01 DIAGNOSIS — Z9484 Stem cells transplant status: Secondary | ICD-10-CM | POA: Diagnosis not present

## 2015-01-01 DIAGNOSIS — R52 Pain, unspecified: Secondary | ICD-10-CM

## 2015-01-01 LAB — CBC WITH DIFFERENTIAL (CANCER CENTER ONLY)
BASO#: 0 10*3/uL (ref 0.0–0.2)
BASO%: 0.5 % (ref 0.0–2.0)
EOS%: 4.5 % (ref 0.0–7.0)
Eosinophils Absolute: 0.2 10*3/uL (ref 0.0–0.5)
HCT: 30.9 % — ABNORMAL LOW (ref 38.7–49.9)
HGB: 10.7 g/dL — ABNORMAL LOW (ref 13.0–17.1)
LYMPH#: 1.2 10*3/uL (ref 0.9–3.3)
LYMPH%: 28.6 % (ref 14.0–48.0)
MCH: 32.4 pg (ref 28.0–33.4)
MCHC: 34.6 g/dL (ref 32.0–35.9)
MCV: 94 fL (ref 82–98)
MONO#: 0.7 10*3/uL (ref 0.1–0.9)
MONO%: 17.2 % — AB (ref 0.0–13.0)
NEUT%: 49.2 % (ref 40.0–80.0)
NEUTROS ABS: 2 10*3/uL (ref 1.5–6.5)
Platelets: 159 10*3/uL (ref 145–400)
RBC: 3.3 10*6/uL — AB (ref 4.20–5.70)
RDW: 12.8 % (ref 11.1–15.7)
WBC: 4 10*3/uL (ref 4.0–10.0)

## 2015-01-01 LAB — COMPREHENSIVE METABOLIC PANEL
ALK PHOS: 66 U/L (ref 39–117)
ALT: 40 U/L (ref 0–53)
AST: 28 U/L (ref 0–37)
Albumin: 3.9 g/dL (ref 3.5–5.2)
BUN: 16 mg/dL (ref 6–23)
CHLORIDE: 105 meq/L (ref 96–112)
CO2: 26 meq/L (ref 19–32)
CREATININE: 1.54 mg/dL — AB (ref 0.50–1.35)
Calcium: 8.9 mg/dL (ref 8.4–10.5)
Glucose, Bld: 90 mg/dL (ref 70–99)
Potassium: 3.9 mEq/L (ref 3.5–5.3)
Sodium: 141 mEq/L (ref 135–145)
Total Bilirubin: 1 mg/dL (ref 0.2–1.2)
Total Protein: 6.9 g/dL (ref 6.0–8.3)

## 2015-01-01 MED ORDER — ALTEPLASE 2 MG IJ SOLR
2.0000 mg | Freq: Once | INTRAMUSCULAR | Status: DC | PRN
  Filled 2015-01-01: qty 2

## 2015-01-01 MED ORDER — ZOLEDRONIC ACID 4 MG/100ML IV SOLN
4.0000 mg | Freq: Once | INTRAVENOUS | Status: AC
  Administered 2015-01-01: 4 mg via INTRAVENOUS
  Filled 2015-01-01: qty 100

## 2015-01-01 NOTE — Patient Instructions (Signed)

## 2015-01-01 NOTE — Progress Notes (Signed)
Hematology and Oncology Follow Up Visit  Mark [name] [id number] 07-21-1971 44 y.o. 01/01/2015   Principle Diagnosis:   Kappa light chain myeloma  Current Therapy:   Revlimid 43m po q day (21/7) - to be on hold Zometa 43 mg IV q. month Velcade q 3 week dosing - to be on hold     Interim History:  Mark [name] back for followup. He seems to be getting worse. He starts his radiation to the right hip today. Apparently he had to go the emergency room because of an accidental fentanyl overdose.  We did go ahead and get an MRI of his right hip when last saw him. This showed a myeloma lesion in L5. There was some more other myelomatous lesions in the right ilium.  His last myeloma studies showed everything to be quite stable. His Kappa Lightchain was 2.98 mg/dL. There is no obvious monoclonal spike in his serum.  It is very hard for him to work now. I told him that he could certainly go out on total disability if necessary.  I am going to stop his Revlimid and Velcade. Maybe these are causing some of the issues.  I think if he has recurrence of his myeloma, then we would have to try something different anyway.  I may need to consider another bone marrow biopsy and aspirate on him. His last one was done back in July of last year.  He has had no issues with cough. He's had some constipation because of his pain medications. He's had no leg swelling. He's had no rashes.  Overall, his performance status is ECOG 1.  Medications:  Current outpatient prescriptions:  .  acetaminophen (TYLENOL) 500 MG tablet, Take 1,000 mg by mouth every 6 (six) hours as needed for moderate pain or headache (headache). , Disp: , Rfl:  .  aspirin EC 325 MG tablet, Take 325 mg by mouth daily., Disp: , Rfl:  .  bisacodyl (DULCOLAX) 5 MG EC tablet, Take 5 mg by mouth daily as needed for moderate constipation (constipation). , Disp: , Rfl:  .  calcium gluconate 500 MG tablet, Take 1 tablet by mouth daily., Disp: , Rfl:   .  gabapentin (NEURONTIN) 300 MG capsule, Take 2 capsules (600 mg total) by mouth 3 (three) times daily., Disp: 180 capsule, Rfl: 3 .  ondansetron (ZOFRAN ODT) 8 MG disintegrating tablet, Take 1 tablet (8 mg total) by mouth every 8 (eight) hours as needed for nausea or vomiting., Disp: 20 tablet, Rfl: 0 .  oxyCODONE-acetaminophen (PERCOCET) 10-325 MG per tablet, Take 1 tablet by mouth every 4 (four) hours as needed for pain., Disp: 120 tablet, Rfl: 0 .  pantoprazole (PROTONIX) 40 MG tablet, Take 1 tablet (40 mg total) by mouth daily., Disp: 30 tablet, Rfl: 1 .  Tapentadol HCl (NUCYNTA) 75 MG TABS, Take 1 tablet (75 mg total) by mouth every 4 (four) hours as needed., Disp: 90 tablet, Rfl: 0 .  fentaNYL (DURAGESIC - DOSED MCG/HR) 75 MCG/HR, Place 1 patch (75 mcg total) onto the skin every 3 (three) days., Disp: 10 patch, Rfl: 0 No current facility-administered medications for this visit.  Facility-Administered Medications Ordered in Other Visits:  .  alteplase (CATHFLO ACTIVASE) injection 2 mg, 2 mg, Intracatheter, Once PRN, PVolanda Napoleon MD  Allergies:  Allergies  Allergen Reactions  . Gadolinium Derivatives Nausea And Vomiting    Pt became very nauseous and got sick.     Past Medical History, Surgical history, Social history,  and Family History were reviewed and updated.  Review of Systems: As above  Physical Exam:  height is '5\' 10"'  (1.778 m) and weight is 215 lb (97.523 kg). His oral temperature is 98.4 F (36.9 C). His blood pressure is 138/95 and his pulse is 83. His respiration is 20.   Well-developed and well-nourished African American gentleman. He is alert and oriented x3. Vital signs are stable. Head and neck exam shows no ocular or oral lesions. He has no palpable cervical or supraclavicular lymph nodes. Oral cavity is totally dry. Lungs are clear. Cardiac exam regular rate and rhythm with no murmurs rubs or bruits. Abdomen is soft. He has good bowel sounds. There is no fluid  wave. There is no palpable liver or spleen tip. Back exam shows no tenderness over the spine or ribs. He is a pain in the right hip. There is pain with flexion and extension he has significant pain with hip abduction and hip abduction.. Extremities shows no clubbing cyanosis or edema.  He has good range of motion of his knees. There is no swelling of his knee joint. Skin exam no rashes. Neurological exam is nonfocal. He has good strength in his legs and feet. Massive slight decrease to sensation in his feet and lower legs. There is no weakness.  Lab Results  Component Value Date   WBC 4.0 01/01/2015   HGB 10.7* 01/01/2015   HCT 30.9* 01/01/2015   MCV 94 01/01/2015   PLT 159 01/01/2015     Chemistry      Component Value Date/Time   NA 141 01/01/2015 1146   NA 137 12/09/2014 1113   NA 145 02/21/2013 0908   K 3.9 01/01/2015 1146   K 4.2 12/09/2014 1113   K 3.8 02/21/2013 0908   CL 105 01/01/2015 1146   CL 103 12/09/2014 1113   CO2 26 01/01/2015 1146   CO2 30 12/09/2014 1113   CO2 32* 02/21/2013 0908   BUN 16 01/01/2015 1146   BUN 15 12/09/2014 1113   BUN 19.5 02/21/2013 0908   CREATININE 1.54* 01/01/2015 1146   CREATININE 1.3* 12/09/2014 1113   CREATININE 1.5* 02/21/2013 0908      Component Value Date/Time   CALCIUM 8.9 01/01/2015 1146   CALCIUM 9.2 12/09/2014 1113   CALCIUM 17.7 Repeated and Verified* 02/21/2013 0908   CALCIUM >15.0* 02/01/2013 1230   ALKPHOS 66 01/01/2015 1146   ALKPHOS 56 12/09/2014 1113   AST 28 01/01/2015 1146   AST 23 12/09/2014 1113   ALT 40 01/01/2015 1146   ALT 31 12/09/2014 1113   BILITOT 1.0 01/01/2015 1146   BILITOT 1.60 12/09/2014 1113         Impression and Plan: Mark [name] is 44 year old -year-old African American male. 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 This was in January or early February of 2015. This was done at [facility].  I instill puzzle as to why he is having all this pain. I know that he had the MRI done  which shows some myelomatous lesions but again his numbers for myeloma really don't look all that bad.  He started his radiation treatments.  I'm going to stop the Revlimid and Velcade. May be this might help with some of the issues that he has.  I just am surprised that he is having these issues. His been now almost 16 months since he had his transplant. He has done well with this.  I still want to keep him on  Zometa.  I will get him back in another 6 weeks. By then, hopefully we will be seen an improvement in his condition.  I told him to go back to the 75 g Duragesic patch.    [name], MD 6/2/20165:09 PM

## 2015-01-01 NOTE — Progress Notes (Signed)
Confirmed that pt is taking Calcium. Per pt he is taking 500 units calcium daily. Instructed per pharmacist to increase to bid d/t Ca 8.1 & Zometa administration. Pt verbalizes understanding. dph

## 2015-01-01 NOTE — Progress Notes (Signed)
  Radiation Oncology         [phone] ________________________________  Name: Mark [name] MRN: [id number]  Date: 01/01/2015  DOB: [birth year]  Simulation Verification Note    ICD-9-CM ICD-10-CM   1. Multiple myeloma 203.00 C90.00     Status: outpatient  NARRATIVE: The patient was brought to the treatment unit and placed in the planned treatment position. The clinical setup was verified. Then port films were obtained and uploaded to the radiation oncology medical record software.  The treatment beams were carefully compared against the planned radiation fields. The position location and shape of the radiation fields was reviewed. They targeted volume of tissue appears to be appropriately covered by the radiation beams. Organs at risk appear to be excluded as planned.  Based on my personal review, I approved the simulation verification. The patient's treatment will proceed as planned.  -----------------------------------  Blair Promise, PhD, MD

## 2015-01-02 ENCOUNTER — Ambulatory Visit
Admission: RE | Admit: 2015-01-02 | Discharge: 2015-01-02 | Disposition: A | Discharge status: Home / Self Care | Payer: BLUE CROSS/BLUE SHIELD | Source: Ambulatory Visit | Attending: Radiation Oncology | Admitting: Radiation Oncology

## 2015-01-02 ENCOUNTER — Telehealth: Payer: Self-pay | Admitting: Hematology & Oncology

## 2015-01-02 DIAGNOSIS — C9000 Multiple myeloma not having achieved remission: Secondary | ICD-10-CM | POA: Diagnosis not present

## 2015-01-02 NOTE — Telephone Encounter (Signed)
Patient was called and message was left of apt date/time 02/04/15.  Calendar was mailed out to patient's home also

## 2015-01-05 ENCOUNTER — Ambulatory Visit
Admission: RE | Admit: 2015-01-05 | Discharge: 2015-01-05 | Disposition: A | Discharge status: Home / Self Care | Payer: BLUE CROSS/BLUE SHIELD | Source: Ambulatory Visit | Attending: Radiation Oncology | Admitting: Radiation Oncology

## 2015-01-05 DIAGNOSIS — C9000 Multiple myeloma not having achieved remission: Secondary | ICD-10-CM | POA: Diagnosis not present

## 2015-01-06 ENCOUNTER — Ambulatory Visit
Admission: RE | Admit: 2015-01-06 | Discharge: 2015-01-06 | Disposition: A | Discharge status: Home / Self Care | Payer: BLUE CROSS/BLUE SHIELD | Source: Ambulatory Visit | Attending: Radiation Oncology | Admitting: Radiation Oncology

## 2015-01-06 ENCOUNTER — Encounter: Payer: Self-pay | Admitting: Radiation Oncology

## 2015-01-06 VITALS — BP 123/80 | HR 84 | Temp 98.5°F | Resp 20 | Wt 212.1 lb

## 2015-01-06 DIAGNOSIS — C9000 Multiple myeloma not having achieved remission: Secondary | ICD-10-CM | POA: Diagnosis present

## 2015-01-06 MED ORDER — LORAZEPAM 1 MG PO TABS
1.0000 mg | ORAL_TABLET | Freq: Once | ORAL | Status: AC
  Administered 2015-01-06: 1 mg via SUBLINGUAL
  Filled 2015-01-06: qty 1

## 2015-01-06 NOTE — Progress Notes (Signed)
1mg  ativan  Oral  verified tablet with Robyne Askew, RN ,patient gave name and dob as identification, gave 1mg  sl ativan per Dr. Sondra Come written and verbal orders, patient advised no driving today,his wife is upstairs, patient gave verbal understanding, 1:01 PM

## 2015-01-06 NOTE — Progress Notes (Signed)
  Radiation Oncology         [phone] ________________________________  Name: Mark [name] MRN: [id number]  Date: 01/06/2015  DOB: 09-Apr-1971  Weekly Radiation Therapy Management   DIAGNOSIS: Light chain multiple myeloma   Current Dose: 8 Gy     Planned Dose:  20 Gy  Narrative . . . . . . . . The patient presents for routine under treatment assessment.                                   The patient has a lot of problems with nausea and some emesis. He was instructed to take his Zofran at least one hour prior to radiation treatment.                                 Set-up films were reviewed.                                 The chart was checked. Physical Findings. . .  weight is 212 lb 1.6 oz (96.208 kg). His oral temperature is 98.5 F (36.9 C). His blood pressure is 123/80 and his pulse is 84. His respiration is 20. . The lungs are clear. The heart has regular rhythm and rate. The abdomen is soft and nontender with normal bowel sounds. Impression . . . . . . . The patient is tolerating radiation. Plan . . . . . . . . . . . . Continue treatment as planned. The patient was given a sublingual Ativan to try for his nausea while in the clinic.  ________________________________   Blair Promise, PhD, MD

## 2015-01-06 NOTE — Progress Notes (Signed)
Weekly rad txs abdomen/pelvis 4/10compleetd, patient c/o nausea,  And arms/neck discomfort,  Offered ginger ale,declined, no diarrhea, takes zofran when he gets home from rad txs, reviewed pt education management of symptoms, ,nausea,vomiting,diarrhea, skin irritation,mild, and fatigue, patient had had radiation befor here, gave rad book with Elmo Putt, RN business card, poor appetite but eating, suggested brat diet, and drink plenty fluids, water stated pateint, carnation instant breakfast also suggested, loss 3 lbs,  12:47 PM 12:47 PM BP 123/80 mmHg  Pulse 84  Temp(Src) 98.5 F (36.9 C) (Oral)  Resp 20  Wt 212 lb 1.6 oz (96.208 kg)  Wt Readings from Last 3 Encounters:  01/01/15 215 lb (97.523 kg)  12/27/14 219 lb (99.338 kg)  12/25/14 218 lb 8 oz (99.111 kg)

## 2015-01-07 ENCOUNTER — Ambulatory Visit
Admission: RE | Admit: 2015-01-07 | Discharge: 2015-01-07 | Disposition: A | Discharge status: Home / Self Care | Payer: BLUE CROSS/BLUE SHIELD | Source: Ambulatory Visit | Attending: Radiation Oncology | Admitting: Radiation Oncology

## 2015-01-07 DIAGNOSIS — C9000 Multiple myeloma not having achieved remission: Secondary | ICD-10-CM | POA: Diagnosis not present

## 2015-01-08 ENCOUNTER — Ambulatory Visit
Admission: RE | Admit: 2015-01-08 | Discharge: 2015-01-08 | Disposition: A | Discharge status: Home / Self Care | Payer: BLUE CROSS/BLUE SHIELD | Source: Ambulatory Visit | Attending: Radiation Oncology | Admitting: Radiation Oncology

## 2015-01-08 DIAGNOSIS — C9000 Multiple myeloma not having achieved remission: Secondary | ICD-10-CM | POA: Diagnosis not present

## 2015-01-09 ENCOUNTER — Ambulatory Visit
Admission: RE | Admit: 2015-01-09 | Discharge: 2015-01-09 | Disposition: A | Discharge status: Home / Self Care | Payer: BLUE CROSS/BLUE SHIELD | Source: Ambulatory Visit | Attending: Radiation Oncology | Admitting: Radiation Oncology

## 2015-01-09 DIAGNOSIS — C9000 Multiple myeloma not having achieved remission: Secondary | ICD-10-CM | POA: Diagnosis not present

## 2015-01-12 ENCOUNTER — Ambulatory Visit
Admission: RE | Admit: 2015-01-12 | Discharge: 2015-01-12 | Disposition: A | Discharge status: Home / Self Care | Payer: BLUE CROSS/BLUE SHIELD | Source: Ambulatory Visit | Attending: Radiation Oncology | Admitting: Radiation Oncology

## 2015-01-12 DIAGNOSIS — C9000 Multiple myeloma not having achieved remission: Secondary | ICD-10-CM | POA: Diagnosis not present

## 2015-01-13 ENCOUNTER — Ambulatory Visit
Admission: RE | Admit: 2015-01-13 | Payer: BLUE CROSS/BLUE SHIELD | Source: Ambulatory Visit | Admitting: Radiation Oncology

## 2015-01-13 ENCOUNTER — Ambulatory Visit
Admission: RE | Admit: 2015-01-13 | Discharge: 2015-01-13 | Disposition: A | Discharge status: Home / Self Care | Payer: BLUE CROSS/BLUE SHIELD | Source: Ambulatory Visit | Attending: Radiation Oncology | Admitting: Radiation Oncology

## 2015-01-13 DIAGNOSIS — C9000 Multiple myeloma not having achieved remission: Secondary | ICD-10-CM | POA: Diagnosis not present

## 2015-01-14 ENCOUNTER — Encounter: Payer: Self-pay | Admitting: Radiation Oncology

## 2015-01-14 ENCOUNTER — Ambulatory Visit
Admission: RE | Admit: 2015-01-14 | Discharge: 2015-01-14 | Disposition: A | Discharge status: Home / Self Care | Payer: BLUE CROSS/BLUE SHIELD | Source: Ambulatory Visit | Attending: Radiation Oncology | Admitting: Radiation Oncology

## 2015-01-14 VITALS — BP 119/82 | HR 74 | Temp 98.8°F | Resp 12 | Ht 70.0 in | Wt 205.0 lb

## 2015-01-14 DIAGNOSIS — C9000 Multiple myeloma not having achieved remission: Secondary | ICD-10-CM

## 2015-01-14 NOTE — Progress Notes (Signed)
  Radiation Oncology         [phone] ________________________________  Name: Mark [name] MRN: [id number]  Date: 01/14/2015  DOB: 12-10-1970  Weekly Radiation Therapy Management   DIAGNOSIS:Mark [name] is a 44 year old male presenting to clinic in regards to his light chain multiple myeloma.   Current Dose:20 Gy  Planned Dose:  20 Gy  Narrative: The patient presents for routine under treatment assessment. He is happy to complete his ration therapy. Overall his pain is much improved. He continues to have some mild problems with nausea. He denies any loose bowels or diarrhea.  Physical Findings:   height is $RemoveB'5\' 10"'qWGbnpNu$  (1.778 m) and weight is 205 lb (92.987 kg). His oral temperature is 98.8 F (37.1 C). His blood pressure is 119/82 and his pulse is 74. His respiration is 12 and oxygen saturation is 100%.  The lungs are clear. The heart has regular rhythm and rate. The abdomen is soft and nontender with normal bowel sounds.   Impression: Mark [name] is a 44 year old male presenting to clinic in regards to his light chain multiple myeloma. The patient is tolerating radiation.   Plan: Routine follow-up in one month.   This document serves as a record of services personally performed by Gery Pray, MD. It was created on his behalf by Lenn Cal, a trained medical scribe. The creation of this record is based on the scribe's personal observations and the provider's statements to them. This document has been checked and approved by the attending provider.    ________________________________   Blair Promise, PhD, MD

## 2015-01-14 NOTE — Progress Notes (Signed)
Mark [name] has completed treatment to his L4-S1 spine.  He reports the pain in his hips is better.  He is rating it at a 5/10 today.  He continues to use a fentanyl patch.  He reports having nausea for the past few day and is taking zofran.  He reports his appetite comes and goes.  He has lost 7 lbs since 01/06/15.  He has gone back to work and says he has been sweating a lot.  He reports fatigue.  He reports having constipation.  His last bowel movement was last night.  The skin on his lower back is intact.  He has been given a one month follow up appointment card.  BP 119/82 mmHg  Pulse 74  Temp(Src) 98.8 F (37.1 C) (Oral)  Resp 12  Ht 5\' 10"  (1.778 m)  Wt 205 lb (92.987 kg)  BMI 29.41 kg/m2  SpO2 100%   Wt Readings from Last 3 Encounters:  01/14/15 205 lb (92.987 kg)  01/06/15 212 lb 1.6 oz (96.208 kg)  01/01/15 215 lb (97.523 kg)

## 2015-01-18 ENCOUNTER — Encounter (HOSPITAL_COMMUNITY): Payer: Self-pay | Admitting: *Deleted

## 2015-01-18 ENCOUNTER — Emergency Department (HOSPITAL_COMMUNITY)
Admission: EM | Admit: 2015-01-18 | Discharge: 2015-01-18 | Disposition: A | Discharge status: Home / Self Care | Payer: BLUE CROSS/BLUE SHIELD | Attending: Emergency Medicine | Admitting: Emergency Medicine

## 2015-01-18 DIAGNOSIS — C9000 Multiple myeloma not having achieved remission: Secondary | ICD-10-CM | POA: Insufficient documentation

## 2015-01-18 DIAGNOSIS — Z87891 Personal history of nicotine dependence: Secondary | ICD-10-CM | POA: Insufficient documentation

## 2015-01-18 DIAGNOSIS — Z7982 Long term (current) use of aspirin: Secondary | ICD-10-CM | POA: Insufficient documentation

## 2015-01-18 DIAGNOSIS — R112 Nausea with vomiting, unspecified: Secondary | ICD-10-CM | POA: Diagnosis not present

## 2015-01-18 DIAGNOSIS — Z79899 Other long term (current) drug therapy: Secondary | ICD-10-CM | POA: Diagnosis not present

## 2015-01-18 DIAGNOSIS — R1110 Vomiting, unspecified: Secondary | ICD-10-CM

## 2015-01-18 LAB — CBC WITH DIFFERENTIAL/PLATELET
BASOS PCT: 0 % (ref 0–1)
Basophils Absolute: 0 10*3/uL (ref 0.0–0.1)
Eosinophils Absolute: 0 10*3/uL (ref 0.0–0.7)
Eosinophils Relative: 1 % (ref 0–5)
HEMATOCRIT: 35.1 % — AB (ref 39.0–52.0)
HEMOGLOBIN: 11.5 g/dL — AB (ref 13.0–17.0)
Lymphocytes Relative: 13 % (ref 12–46)
Lymphs Abs: 0.5 10*3/uL — ABNORMAL LOW (ref 0.7–4.0)
MCH: 30.7 pg (ref 26.0–34.0)
MCHC: 32.8 g/dL (ref 30.0–36.0)
MCV: 93.9 fL (ref 78.0–100.0)
MONO ABS: 0.3 10*3/uL (ref 0.1–1.0)
Monocytes Relative: 8 % (ref 3–12)
NEUTROS ABS: 2.8 10*3/uL (ref 1.7–7.7)
NEUTROS PCT: 78 % — AB (ref 43–77)
Platelets: 140 10*3/uL — ABNORMAL LOW (ref 150–400)
RBC: 3.74 MIL/uL — ABNORMAL LOW (ref 4.22–5.81)
RDW: 13.5 % (ref 11.5–15.5)
WBC: 3.6 10*3/uL — ABNORMAL LOW (ref 4.0–10.5)

## 2015-01-18 LAB — COMPREHENSIVE METABOLIC PANEL
ALT: 26 U/L (ref 17–63)
ANION GAP: 2 — AB (ref 5–15)
AST: 23 U/L (ref 15–41)
Albumin: 4.6 g/dL (ref 3.5–5.0)
Alkaline Phosphatase: 60 U/L (ref 38–126)
BUN: 18 mg/dL (ref 6–20)
CO2: 27 mmol/L (ref 22–32)
Calcium: 8.8 mg/dL — ABNORMAL LOW (ref 8.9–10.3)
Chloride: 107 mmol/L (ref 101–111)
Creatinine, Ser: 1.22 mg/dL (ref 0.61–1.24)
GFR calc Af Amer: >60 mL/min (ref >60)
GFR calc non Af Amer: >60 mL/min (ref >60)
GLUCOSE: 103 mg/dL — AB (ref 65–99)
Potassium: 4 mmol/L (ref 3.5–5.1)
SODIUM: 136 mmol/L (ref 135–145)
TOTAL PROTEIN: 7.7 g/dL (ref 6.5–8.1)
Total Bilirubin: 1.8 mg/dL — ABNORMAL HIGH (ref 0.3–1.2)

## 2015-01-18 LAB — I-STAT TROPONIN, ED: Troponin i, poc: 0 ng/mL (ref 0.00–0.08)

## 2015-01-18 LAB — URINALYSIS, ROUTINE W REFLEX MICROSCOPIC
BILIRUBIN URINE: NEGATIVE
GLUCOSE, UA: NEGATIVE mg/dL
Hgb urine dipstick: NEGATIVE
KETONES UR: NEGATIVE mg/dL
Leukocytes, UA: NEGATIVE
NITRITE: NEGATIVE
PH: 6 (ref 5.0–8.0)
Protein, ur: 30 mg/dL — AB
SPECIFIC GRAVITY, URINE: 1.034 — AB (ref 1.005–1.030)
Urobilinogen, UA: 0.2 mg/dL (ref 0.0–1.0)

## 2015-01-18 LAB — URINE MICROSCOPIC-ADD ON

## 2015-01-18 LAB — I-STAT CG4 LACTIC ACID, ED: LACTIC ACID, VENOUS: 0.85 mmol/L (ref 0.5–2.0)

## 2015-01-18 LAB — LIPASE, BLOOD: Lipase: 20 U/L — ABNORMAL LOW (ref 22–51)

## 2015-01-18 MED ORDER — ONDANSETRON HCL 4 MG/2ML IJ SOLN
4.0000 mg | Freq: Once | INTRAMUSCULAR | Status: AC
  Administered 2015-01-18: 4 mg via INTRAVENOUS
  Filled 2015-01-18: qty 2

## 2015-01-18 MED ORDER — SODIUM CHLORIDE 0.9 % IV BOLUS (SEPSIS)
1000.0000 mL | Freq: Once | INTRAVENOUS | Status: AC
  Administered 2015-01-18: 1000 mL via INTRAVENOUS

## 2015-01-18 MED ORDER — PROMETHAZINE HCL 25 MG/ML IJ SOLN
12.5000 mg | Freq: Once | INTRAMUSCULAR | Status: AC
  Administered 2015-01-18: 12.5 mg via INTRAVENOUS
  Filled 2015-01-18: qty 1

## 2015-01-18 MED ORDER — HEPARIN SOD (PORK) LOCK FLUSH 100 UNIT/ML IV SOLN
500.0000 [IU] | Freq: Once | INTRAVENOUS | Status: AC
  Administered 2015-01-18: 500 [IU]
  Filled 2015-01-18: qty 5

## 2015-01-18 NOTE — ED Notes (Signed)
Pt is receiving radiation treatment [place] 10 treatments - completed.  Has been taking zofran, but over the last two days his vomiting has been worse and unable to keep anything down.  Denies fever.

## 2015-01-18 NOTE — Discharge Instructions (Signed)

## 2015-01-18 NOTE — ED Provider Notes (Signed)
CSN: [id number]     Arrival date & time 01/18/15  1751 History   First MD Initiated Contact with Patient 01/18/15 1804     Chief Complaint  Patient presents with  . Nausea  . Emesis     (Consider location/radiation/quality/duration/timing/severity/associated sxs/prior Treatment) Patient is a 44 y.o. male presenting with vomiting. The history is provided by the patient.  Emesis Severity:  Severe Duration:  5 days Timing:  Intermittent Number of daily episodes:  In the last 2 days he's had 5-6 episodes per day and now unable to hold anything down Quality:  Stomach contents Progression:  Worsening Chronicity:  Recurrent Recent urination:  Decreased Relieved by:  Nothing Worsened by:  Food smell, ice chips and liquids Associated symptoms: no abdominal pain, no chills, no cough, no diarrhea, no fever and no URI   Risk factors comment:  History of multiple myeloma currently getting radiation therapy to the pelvis   Past Medical History  Diagnosis Date  . History of radiation therapy 02/07/13- 02/26/13    lower L spine, upper sacrum, 35 gray in 14 fractions  . FH: chemotherapy     Dr. Burney Gauze  . Cancer   . Multiple myeloma dx'd 01/2013   Past Surgical History  Procedure Laterality Date  . Portacath placement    . 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 Kyphoplasty  05/20/14  . Lumbar laminectomy/decompression microdiscectomy N/A 09/25/2014    Procedure: LUMBAR LAMINECTOMY/DECOMPRESSION MICRODISCECTOMY;  Surgeon: Sinclair Ship, MD;  Location: [facility];  Service: Orthopedics;  Laterality: N/A;  Lumbar 4-5, L5-S1  decompression   Family History  Problem Relation Age of Onset  . Diabetic kidney disease Mother   . Hypertension Mother   . Heart attack Mother   . Kidney failure Mother   . Coronary artery disease Mother   . HIV Father    History  Substance Use Topics  . Smoking status: Former Smoker -- 1.00 packs/day for 15 years    Types: Cigarettes     Start date: 06/29/1988    Quit date: 06/29/2003  . Smokeless tobacco: Never Used     Comment: quit smoking 10 years ago  . Alcohol Use: No    Review of Systems  Constitutional: Negative for chills.  Gastrointestinal: Positive for vomiting. Negative for abdominal pain and diarrhea.  All other systems reviewed and are negative.     Allergies  Gadolinium derivatives  Home Medications   Prior to Admission medications   Medication Sig Start Date End Date Taking? Authorizing Provider  acetaminophen (TYLENOL) 500 MG tablet Take 1,000 mg by mouth every 6 (six) hours as needed for moderate pain or headache (headache).    Yes Historical Provider, MD  aspirin EC 325 MG tablet Take 325 mg by mouth daily.   Yes Historical Provider, MD  bisacodyl (DULCOLAX) 5 MG EC tablet Take 5 mg by mouth daily as needed for moderate constipation (constipation).    Yes Historical Provider, MD  calcium gluconate 500 MG tablet Take 500 mg by mouth daily.    Yes Historical Provider, MD  fentaNYL (DURAGESIC - DOSED MCG/HR) 75 MCG/HR Place 1 patch (75 mcg total) onto the skin every 3 (three) days. 12/09/14  Yes [name], MD  gabapentin (NEURONTIN) 300 MG capsule Take 2 capsules (600 mg total) by mouth 3 (three) times daily. 11/20/14  Yes [name], NP  ondansetron (ZOFRAN ODT) 8 MG disintegrating tablet Take 1 tablet (8 mg total)  by mouth every 8 (eight) hours as needed for nausea or vomiting. 11/07/14  Yes Barton Dubois, MD  oxyCODONE-acetaminophen (PERCOCET) 10-325 MG per tablet Take 1 tablet by mouth every 4 (four) hours as needed for pain. 08/28/14  Yes [name], MD  pantoprazole (PROTONIX) 40 MG tablet Take 1 tablet (40 mg total) by mouth daily. 11/07/14  Yes Barton Dubois, MD  Tapentadol HCl (NUCYNTA) 75 MG TABS Take 1 tablet (75 mg total) by mouth every 4 (four) hours as needed. Patient taking differently: Take 75 mg by mouth every 4 (four) hours as needed (pain).  12/09/14  Yes [name],  MD   BP 138/80 mmHg  Pulse 76  Temp(Src) 98.2 F (36.8 C) (Oral)  Resp 18  SpO2 96% Physical Exam  Constitutional: He is oriented to person, place, and time. He appears well-developed and well-nourished. No distress.  HENT:  Head: Normocephalic and atraumatic.  Mouth/Throat: Oropharynx is clear and moist. Mucous membranes are dry.  Eyes: Conjunctivae and EOM are normal. Pupils are equal, round, and reactive to light.  Neck: Normal range of motion. Neck supple.  Cardiovascular: Normal rate, regular rhythm and intact distal pulses.   No murmur heard. Pulmonary/Chest: Effort normal and breath sounds normal. No respiratory distress. He has no wheezes. He has no rales.  Abdominal: Soft. He exhibits no distension. There is no tenderness. There is no rebound and no guarding.  Musculoskeletal: Normal range of motion. He exhibits no edema or tenderness.  Neurological: He is alert and oriented to person, place, and time.  Skin: Skin is warm and dry. No rash noted. No erythema.  Psychiatric: He has a normal mood and affect. His behavior is normal.  Nursing note and vitals reviewed.   ED Course  Procedures (including critical care time) Labs Review Labs Reviewed  CBC WITH DIFFERENTIAL/PLATELET - Abnormal; Notable for the following:    WBC 3.6 (*)    RBC 3.74 (*)    Hemoglobin 11.5 (*)    HCT 35.1 (*)    Platelets 140 (*)    Neutrophils Relative % 78 (*)    Lymphs Abs 0.5 (*)    All other components within normal limits  COMPREHENSIVE METABOLIC PANEL - Abnormal; Notable for the following:    Glucose, Bld 103 (*)    Calcium 8.8 (*)    Total Bilirubin 1.8 (*)    Anion gap 2 (*)    All other components within normal limits  LIPASE, BLOOD - Abnormal; Notable for the following:    Lipase 20 (*)    All other components within normal limits  URINALYSIS, ROUTINE W REFLEX MICROSCOPIC (NOT AT [facility])  I-STAT TROPOININ, ED  I-STAT CG4 LACTIC ACID, ED    Imaging Review No results  found.   EKG Interpretation   Date/Time:  Sunday January 18 2015 18:35:40 EDT Ventricular Rate:  71 PR Interval:  167 QRS Duration: 98 QT Interval:  404 QTC Calculation: 439 R Axis:   69 Text Interpretation:  Sinus rhythm Normal ECG No significant change since  last tracing Confirmed by Maryan Rued  MD, Loree Fee ([id number]) on 01/18/2015  6:42:03 PM      MDM   Final diagnoses:  Intractable vomiting with nausea, vomiting of unspecified type    Patient with a history of multiple myeloma who was currently undergoing radiation therapy to his pelvis presents today with a 4-5 day history of worsening nausea and vomiting. In the last 2 days patient has had 5-6 episodes of vomiting per  day. He denies fever, abdominal pain, change in urine, diarrhea. He denies any respiratory or cardiac symptoms. On exam he is hemodynamically stable with dry mucous membranes. No focal abdominal tenderness.  He has not changed any of his pain medications any still takes fentanyl, Percocet and Nucynta.  Low suspicion that this is opiate withdrawal. Patient is taken Zofran at home without improvement in symptoms.  EKG is within normal limits with low suspicion that this is cardiac in nature. CBC, CMP, lipase, UA, lactic acid pending. Patient given IV fluids and Zofran.  7:30 PM Labs are all within normal limits. We'll attempt to control nausea  7:42 PM On re-eval pt still having some mild nausea but denies wanting anymore nausea meds.  Pt given Ice chips  Blanchie Dessert, MD 01/18/15 [phone]

## 2015-01-18 NOTE — ED Notes (Signed)
Pt was given ice water earlier by EDP for fluid challenge---- tolerating well.

## 2015-01-18 NOTE — ED Notes (Signed)
Pt is aware of the need for urine, however is unable to provide a sample at this time. 

## 2015-01-21 ENCOUNTER — Encounter: Payer: Self-pay | Admitting: Skilled Nursing Facility1

## 2015-01-21 NOTE — Progress Notes (Signed)
Subjective:     Patient ID: Mark [name], male   DOB: 07-30-1971, 44 y.o.   MRN: [id number]  HPI   Review of Systems     Objective:   Physical Exam To assist the pt in identifying dietary strategies to gain some lost wt back.     Assessment:     Pt identified as being malnourished due to some lost wt. Pt contacted via the telephone at [phone]. Pt was unavailable.    Plan:     No plan identified at this time as the there is no voicemail.

## 2015-01-22 ENCOUNTER — Other Ambulatory Visit: Payer: BLUE CROSS/BLUE SHIELD

## 2015-01-22 ENCOUNTER — Ambulatory Visit: Payer: BLUE CROSS/BLUE SHIELD | Admitting: Hematology & Oncology

## 2015-01-22 ENCOUNTER — Ambulatory Visit: Payer: BLUE CROSS/BLUE SHIELD

## 2015-01-25 NOTE — Progress Notes (Signed)
  Radiation Oncology         [phone] ________________________________  Name: Mark [name] MRN: [id number]  Date: 01/14/2015  DOB: [birth year]  End of Treatment Note   ICD-9-CM ICD-10-CM    1. Multiple myeloma 203.00 C90.00     DIAGNOSIS: Light chain multiple myeloma     Indication for treatment:  Recurrence in the previous area of treatment in the lower lumbar spine/upper sacrum area and associated pain       Radiation treatment dates:   01/01/2015-01/14/2015  Site/dose:   20 grays in 10 fractions  Beams/energy:   Intensity modulated radiation therapy, volumetric arc treatment, 6 MV photons  Narrative: The patient tolerated radiation treatment relatively well.   He did have some nausea with his treatments. His pain did improve during the course of therapy  Plan: The patient has completed radiation treatment. The patient will return to radiation oncology clinic for routine followup in one month. I advised them to call or return sooner if they have any questions or concerns related to their recovery or treatment.  -----------------------------------  Blair Promise, PhD, MD

## 2015-01-27 ENCOUNTER — Encounter (HOSPITAL_COMMUNITY): Payer: Self-pay | Admitting: Emergency Medicine

## 2015-01-27 ENCOUNTER — Emergency Department (HOSPITAL_COMMUNITY)
Admission: EM | Admit: 2015-01-27 | Discharge: 2015-01-27 | Disposition: A | Discharge status: Home / Self Care | Payer: BLUE CROSS/BLUE SHIELD | Attending: Emergency Medicine | Admitting: Emergency Medicine

## 2015-01-27 DIAGNOSIS — Z87891 Personal history of nicotine dependence: Secondary | ICD-10-CM | POA: Diagnosis not present

## 2015-01-27 DIAGNOSIS — G893 Neoplasm related pain (acute) (chronic): Secondary | ICD-10-CM | POA: Diagnosis not present

## 2015-01-27 DIAGNOSIS — Z79899 Other long term (current) drug therapy: Secondary | ICD-10-CM | POA: Diagnosis not present

## 2015-01-27 DIAGNOSIS — C9000 Multiple myeloma not having achieved remission: Secondary | ICD-10-CM | POA: Diagnosis not present

## 2015-01-27 DIAGNOSIS — Z7982 Long term (current) use of aspirin: Secondary | ICD-10-CM | POA: Diagnosis not present

## 2015-01-27 DIAGNOSIS — R112 Nausea with vomiting, unspecified: Secondary | ICD-10-CM

## 2015-01-27 DIAGNOSIS — R52 Pain, unspecified: Secondary | ICD-10-CM | POA: Diagnosis present

## 2015-01-27 LAB — COMPREHENSIVE METABOLIC PANEL
ALT: 27 U/L (ref 17–63)
AST: 33 U/L (ref 15–41)
Albumin: 4.4 g/dL (ref 3.5–5.0)
Alkaline Phosphatase: 58 U/L (ref 38–126)
Anion gap: 9 (ref 5–15)
BUN: 15 mg/dL (ref 6–20)
CHLORIDE: 104 mmol/L (ref 101–111)
CO2: 25 mmol/L (ref 22–32)
CREATININE: 1.13 mg/dL (ref 0.61–1.24)
Calcium: 9.5 mg/dL (ref 8.9–10.3)
GFR calc Af Amer: >60 mL/min (ref >60)
Glucose, Bld: 155 mg/dL — ABNORMAL HIGH (ref 65–99)
POTASSIUM: 3.7 mmol/L (ref 3.5–5.1)
Sodium: 138 mmol/L (ref 135–145)
Total Bilirubin: 1.6 mg/dL — ABNORMAL HIGH (ref 0.3–1.2)
Total Protein: 7.3 g/dL (ref 6.5–8.1)

## 2015-01-27 LAB — CBC WITH DIFFERENTIAL/PLATELET
BASOS ABS: 0 10*3/uL (ref 0.0–0.1)
Basophils Relative: 0 % (ref 0–1)
EOS PCT: 5 % (ref 0–5)
Eosinophils Absolute: 0.1 10*3/uL (ref 0.0–0.7)
HEMATOCRIT: 34.9 % — AB (ref 39.0–52.0)
Hemoglobin: 11.6 g/dL — ABNORMAL LOW (ref 13.0–17.0)
LYMPHS ABS: 0.7 10*3/uL (ref 0.7–4.0)
Lymphocytes Relative: 26 % (ref 12–46)
MCH: 31.4 pg (ref 26.0–34.0)
MCHC: 33.2 g/dL (ref 30.0–36.0)
MCV: 94.6 fL (ref 78.0–100.0)
MONO ABS: 0.4 10*3/uL (ref 0.1–1.0)
Monocytes Relative: 16 % — ABNORMAL HIGH (ref 3–12)
NEUTROS ABS: 1.4 10*3/uL — AB (ref 1.7–7.7)
Neutrophils Relative %: 53 % (ref 43–77)
Platelets: 121 10*3/uL — ABNORMAL LOW (ref 150–400)
RBC: 3.69 MIL/uL — AB (ref 4.22–5.81)
RDW: 13.9 % (ref 11.5–15.5)
WBC: 2.7 10*3/uL — AB (ref 4.0–10.5)

## 2015-01-27 LAB — I-STAT TROPONIN, ED: Troponin i, poc: 0 ng/mL (ref 0.00–0.08)

## 2015-01-27 MED ORDER — METOCLOPRAMIDE HCL 10 MG PO TABS: 10.0000 mg | ORAL_TABLET | Freq: Four times a day (QID) | ORAL | Status: DC | PRN

## 2015-01-27 MED ORDER — SODIUM CHLORIDE 0.9 % IV BOLUS (SEPSIS)
1000.0000 mL | Freq: Once | INTRAVENOUS | Status: AC
  Administered 2015-01-27: 1000 mL via INTRAVENOUS

## 2015-01-27 MED ORDER — HYDROMORPHONE HCL 1 MG/ML IJ SOLN
1.0000 mg | Freq: Once | INTRAMUSCULAR | Status: AC
  Administered 2015-01-27: 1 mg via INTRAVENOUS
  Filled 2015-01-27: qty 1

## 2015-01-27 MED ORDER — METOCLOPRAMIDE HCL 5 MG/ML IJ SOLN
10.0000 mg | Freq: Once | INTRAMUSCULAR | Status: AC
  Administered 2015-01-27: 10 mg via INTRAVENOUS
  Filled 2015-01-27: qty 2

## 2015-01-27 MED ORDER — HEPARIN SOD (PORK) LOCK FLUSH 100 UNIT/ML IV SOLN
500.0000 [IU] | Freq: Once | INTRAVENOUS | Status: AC
  Administered 2015-01-27: 500 [IU]
  Filled 2015-01-27: qty 5

## 2015-01-27 NOTE — ED Notes (Addendum)
Pt states that he had radiation done last week on his hips and lumbar region and is now having pain in those areas a long with generalized body aches and chest pain. Alert and oriented.

## 2015-01-27 NOTE — ED Provider Notes (Signed)
CSN: [id number]     Arrival date & time 01/27/15  0201 History   First MD Initiated Contact with Patient 01/27/15 0252     Chief Complaint  Patient presents with  . Generalized Body Aches     (Consider location/radiation/quality/duration/timing/severity/associated sxs/prior Treatment) HPI  This is a 44 year old male who was diagnosed with multiple myeloma 2 years ago. He has had a non-pathologists stem cell transplant as well as chemotherapy. He recently completed a course of radiation therapy for which he suspended his Revlimid; he is not scheduled to restart his Revlimid until his next appointment with his oncologist. He is here with a one-week history of worsening pain. The pain is primarily in his lower back and hips as well as in his sternum. The pain is worse with movement particularly movement of the chest. He also has chronic pins and needles sensation in his lower legs which she attributes to chemotherapy in the Revlimid. He has been taking Zofran for control of chronic nausea. He thinks this Zofran has been giving him a headache. When the Zofran wears off he often has episodes of vomiting and is concerned he may be dehydrated. His pain is moderate to severe and is not adequately controlled with home medications.  Past Medical History  Diagnosis Date  . History of radiation therapy 02/07/13- 02/26/13    lower L spine, upper sacrum, 35 gray in 14 fractions  . FH: chemotherapy     Dr. Burney Gauze  . Cancer   . Multiple myeloma dx'd 01/2013   Past Surgical History  Procedure Laterality Date  . Portacath placement    . 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 Kyphoplasty  05/20/14  . Lumbar laminectomy/decompression microdiscectomy N/A 09/25/2014    Procedure: LUMBAR LAMINECTOMY/DECOMPRESSION MICRODISCECTOMY;  Surgeon: Sinclair Ship, MD;  Location: [facility];  Service: Orthopedics;  Laterality: N/A;  Lumbar 4-5, L5-S1  decompression   Family History  Problem  Relation Age of Onset  . Diabetic kidney disease Mother   . Hypertension Mother   . Heart attack Mother   . Kidney failure Mother   . Coronary artery disease Mother   . HIV Father    History  Substance Use Topics  . Smoking status: Former Smoker -- 1.00 packs/day for 15 years    Types: Cigarettes    Start date: 06/29/1988    Quit date: 06/29/2003  . Smokeless tobacco: Never Used     Comment: quit smoking 10 years ago  . Alcohol Use: No    Review of Systems  All other systems reviewed and are negative.   Allergies  Gadolinium derivatives  Home Medications   Prior to Admission medications   Medication Sig Start Date End Date Taking? Authorizing Provider  acetaminophen (TYLENOL) 500 MG tablet Take 1,000 mg by mouth every 6 (six) hours as needed for moderate pain or headache (headache).    Yes Historical Provider, MD  aspirin EC 325 MG tablet Take 325 mg by mouth daily.   Yes Historical Provider, MD  bisacodyl (DULCOLAX) 5 MG EC tablet Take 5 mg by mouth daily as needed for moderate constipation (constipation).    Yes Historical Provider, MD  fentaNYL (DURAGESIC - DOSED MCG/HR) 75 MCG/HR Place 1 patch (75 mcg total) onto the skin every 3 (three) days. 12/09/14  Yes [name], MD  gabapentin (NEURONTIN) 300 MG capsule Take 2 capsules (600 mg total) by mouth 3 (three) times daily. 11/20/14  Yes Holli Humbles  [place], NP  lenalidomide (REVLIMID) 10 MG capsule Take 10 mg by mouth daily.   Yes Historical Provider, MD  ondansetron (ZOFRAN ODT) 8 MG disintegrating tablet Take 1 tablet (8 mg total) by mouth every 8 (eight) hours as needed for nausea or vomiting. 11/07/14  Yes Barton Dubois, MD  oxyCODONE-acetaminophen (PERCOCET) 10-325 MG per tablet Take 1 tablet by mouth every 4 (four) hours as needed for pain. 08/28/14  Yes [name], MD  Oyster Shell (OYSTER CALCIUM) 500 MG TABS tablet Take 500 mg of elemental calcium by mouth daily.   Yes Historical Provider, MD  pantoprazole  (PROTONIX) 40 MG tablet Take 1 tablet (40 mg total) by mouth daily. 11/07/14  Yes Barton Dubois, MD  Tapentadol HCl (NUCYNTA) 75 MG TABS Take 1 tablet (75 mg total) by mouth every 4 (four) hours as needed. Patient taking differently: Take 75 mg by mouth every 4 (four) hours as needed (pain).  12/09/14  Yes [name], MD  metoCLOPramide (REGLAN) 10 MG tablet Take 1 tablet (10 mg total) by mouth every 6 (six) hours as needed for nausea or vomiting. 01/27/15   Zayden Maffei, MD   BP 150/91 mmHg  Pulse 73  Temp(Src) 98.1 F (36.7 C) (Oral)  Resp 16  SpO2 100%   Physical Exam  General: Well-developed, well-nourished male in no acute distress; appearance consistent with age of record HENT: normocephalic; atraumatic Eyes: pupils equal, round and reactive to light; extraocular muscles intact Neck: supple Heart: regular rate and rhythm Chest: Port-A-Cath right upper chest; severe sternal tenderness without deformity palpated Lungs: clear to auscultation bilaterally Abdomen: soft; nondistended; nontender; no masses or hepatosplenomegaly; bowel sounds present Extremities: No deformity; full range of motion; pulses normal Neurologic: Awake, alert and oriented; motor function intact in all extremities and symmetric; altered sensation in lower legs; no facial droop Skin: Warm and dry Psychiatric: Normal mood and affect    ED Course  Procedures (including critical care time)   EKG Interpretation   Date/Time:  Tuesday January 27 2015 02:07:52 EDT Ventricular Rate:  83 PR Interval:  158 QRS Duration: 92 QT Interval:  356 QTC Calculation: 418 R Axis:   50 Text Interpretation:  Sinus rhythm ST elevation suggests acute  pericarditis Baseline wander in lead(s) III aVL aVF ST elevations more  prominent Confirmed by Juliya Magill  MD, Jenny Reichmann ([id number]) on 01/27/2015 2:19:23 AM      MDM   Nursing notes and vitals signs, including pulse oximetry, reviewed.  Summary of this visit's results, reviewed by  myself:   EKG Interpretation  Date/Time:  Tuesday January 27 2015 02:07:52 EDT Ventricular Rate:  83 PR Interval:  158 QRS Duration: 92 QT Interval:  356 QTC Calculation: 418 R Axis:   50 Text Interpretation:  Sinus rhythm ST elevation suggests acute pericarditis Baseline wander in lead(s) III aVL aVF ST elevations more prominent Confirmed by Ardie Dragoo  MD, Jenny Reichmann ([id number]) on 01/27/2015 2:19:23 AM       Labs:  Results for orders placed or performed during the hospital encounter of 01/27/15 (from the past 24 hour(s))  CBC with Differential/Platelet     Status: Abnormal   Collection Time: 01/27/15  2:20 AM  Result Value Ref Range   WBC 2.7 (L) 4.0 - 10.5 K/uL   RBC 3.69 (L) 4.22 - 5.81 MIL/uL   Hemoglobin 11.6 (L) 13.0 - 17.0 g/dL   HCT 34.9 (L) 39.0 - 52.0 %   MCV 94.6 78.0 - 100.0 fL   MCH 31.4 26.0 -  34.0 pg   MCHC 33.2 30.0 - 36.0 g/dL   RDW 13.9 11.5 - 15.5 %   Platelets 121 (L) 150 - 400 K/uL   Neutrophils Relative % 53 43 - 77 %   Neutro Abs 1.4 (L) 1.7 - 7.7 K/uL   Lymphocytes Relative 26 12 - 46 %   Lymphs Abs 0.7 0.7 - 4.0 K/uL   Monocytes Relative 16 (H) 3 - 12 %   Monocytes Absolute 0.4 0.1 - 1.0 K/uL   Eosinophils Relative 5 0 - 5 %   Eosinophils Absolute 0.1 0.0 - 0.7 K/uL   Basophils Relative 0 0 - 1 %   Basophils Absolute 0.0 0.0 - 0.1 K/uL  Comprehensive metabolic panel     Status: Abnormal   Collection Time: 01/27/15  2:20 AM  Result Value Ref Range   Sodium 138 135 - 145 mmol/L   Potassium 3.7 3.5 - 5.1 mmol/L   Chloride 104 101 - 111 mmol/L   CO2 25 22 - 32 mmol/L   Glucose, Bld 155 (H) 65 - 99 mg/dL   BUN 15 6 - 20 mg/dL   Creatinine, Ser 1.13 0.61 - 1.24 mg/dL   Calcium 9.5 8.9 - 10.3 mg/dL   Total Protein 7.3 6.5 - 8.1 g/dL   Albumin 4.4 3.5 - 5.0 g/dL   AST 33 15 - 41 U/L   ALT 27 17 - 63 U/L   Alkaline Phosphatase 58 38 - 126 U/L   Total Bilirubin 1.6 (H) 0.3 - 1.2 mg/dL   GFR calc non Af Amer >60 >60 mL/min   GFR calc Af Amer >60 >60 mL/min    Anion gap 9 5 - 15  I-stat troponin, ED     Status: None   Collection Time: 01/27/15  2:48 AM  Result Value Ref Range   Troponin i, poc 0.00 0.00 - 0.08 ng/mL   Comment 3           4:22 AM Akin nausea significantly improved with IV Reglan. Body pain improved with Dilaudid. We will switch him from Zofran to Reglan until he can follow-up with his oncologist for further evaluation.   Shanon Rosser, MD 01/27/15 [phone]

## 2015-01-28 ENCOUNTER — Telehealth: Payer: Self-pay | Admitting: *Deleted

## 2015-01-28 NOTE — Telephone Encounter (Signed)
Patient c/o generalized pain. He states he has pain in the chest, hips, legs, feet. He has been to the ED twice in recent past for pain control. He finished radiation on 01/14/15 and hasn't felt good since. Currently he is scheduled for an appointment with this office next Wednesday, July 7th. Spoke to Dr Marin Olp regarding patient concerns. Dr Marin Olp would like him to increase his hs dose of Neurontin to 1200mg  and use two Fentanyl patches instead on one until he is seen in the office on Wednesday. Instructed patient on dosage changes. Also reviewed signs and symptoms of oversedation. Instructed patient to review these with his wife as well. Instructed patient that if he started to exhibit any of these symptom to remove the second patch. He is in understanding.

## 2015-02-04 ENCOUNTER — Inpatient Hospital Stay (HOSPITAL_COMMUNITY): Payer: BLUE CROSS/BLUE SHIELD

## 2015-02-04 ENCOUNTER — Encounter (HOSPITAL_COMMUNITY): Payer: Self-pay | Admitting: *Deleted

## 2015-02-04 ENCOUNTER — Ambulatory Visit (HOSPITAL_BASED_OUTPATIENT_CLINIC_OR_DEPARTMENT_OTHER): Payer: BLUE CROSS/BLUE SHIELD

## 2015-02-04 ENCOUNTER — Ambulatory Visit: Payer: BLUE CROSS/BLUE SHIELD

## 2015-02-04 ENCOUNTER — Ambulatory Visit (HOSPITAL_BASED_OUTPATIENT_CLINIC_OR_DEPARTMENT_OTHER): Payer: BLUE CROSS/BLUE SHIELD | Admitting: Hematology & Oncology

## 2015-02-04 ENCOUNTER — Inpatient Hospital Stay (HOSPITAL_COMMUNITY)
Admission: AD | Admit: 2015-02-04 | Discharge: 2015-02-08 | DRG: 842 | Disposition: A | Discharge status: Home / Self Care | Payer: BLUE CROSS/BLUE SHIELD | Source: Ambulatory Visit | Attending: Hematology & Oncology | Admitting: Hematology & Oncology

## 2015-02-04 ENCOUNTER — Encounter: Payer: Self-pay | Admitting: Hematology & Oncology

## 2015-02-04 VITALS — BP 117/88 | HR 99 | Temp 98.5°F | Resp 18 | Ht 70.0 in | Wt 197.0 lb

## 2015-02-04 DIAGNOSIS — C9000 Multiple myeloma not having achieved remission: Secondary | ICD-10-CM

## 2015-02-04 DIAGNOSIS — E860 Dehydration: Secondary | ICD-10-CM | POA: Diagnosis present

## 2015-02-04 DIAGNOSIS — M25551 Pain in right hip: Secondary | ICD-10-CM

## 2015-02-04 DIAGNOSIS — Z8249 Family history of ischemic heart disease and other diseases of the circulatory system: Secondary | ICD-10-CM

## 2015-02-04 DIAGNOSIS — K5900 Constipation, unspecified: Secondary | ICD-10-CM | POA: Diagnosis present

## 2015-02-04 DIAGNOSIS — R112 Nausea with vomiting, unspecified: Secondary | ICD-10-CM

## 2015-02-04 DIAGNOSIS — Z807 Family history of other malignant neoplasms of lymphoid, hematopoietic and related tissues: Secondary | ICD-10-CM | POA: Diagnosis not present

## 2015-02-04 DIAGNOSIS — D696 Thrombocytopenia, unspecified: Secondary | ICD-10-CM | POA: Diagnosis present

## 2015-02-04 DIAGNOSIS — R1110 Vomiting, unspecified: Secondary | ICD-10-CM | POA: Diagnosis present

## 2015-02-04 DIAGNOSIS — Z923 Personal history of irradiation: Secondary | ICD-10-CM

## 2015-02-04 DIAGNOSIS — M25552 Pain in left hip: Secondary | ICD-10-CM | POA: Diagnosis not present

## 2015-02-04 DIAGNOSIS — Z830 Family history of human immunodeficiency virus [HIV] disease: Secondary | ICD-10-CM

## 2015-02-04 DIAGNOSIS — D649 Anemia, unspecified: Secondary | ICD-10-CM | POA: Diagnosis present

## 2015-02-04 DIAGNOSIS — Z9484 Stem cells transplant status: Secondary | ICD-10-CM | POA: Diagnosis not present

## 2015-02-04 DIAGNOSIS — G4701 Insomnia due to medical condition: Secondary | ICD-10-CM

## 2015-02-04 DIAGNOSIS — Z87891 Personal history of nicotine dependence: Secondary | ICD-10-CM

## 2015-02-04 DIAGNOSIS — R1111 Vomiting without nausea: Secondary | ICD-10-CM | POA: Diagnosis not present

## 2015-02-04 DIAGNOSIS — K2950 Unspecified chronic gastritis without bleeding: Secondary | ICD-10-CM | POA: Diagnosis present

## 2015-02-04 LAB — CBC WITH DIFFERENTIAL (CANCER CENTER ONLY)
BASO#: 0 10*3/uL (ref 0.0–0.2)
BASO%: 0.4 % (ref 0.0–2.0)
EOS ABS: 0.1 10*3/uL (ref 0.0–0.5)
EOS%: 4.9 % (ref 0.0–7.0)
HCT: 37.1 % — ABNORMAL LOW (ref 38.7–49.9)
HGB: 12.7 g/dL — ABNORMAL LOW (ref 13.0–17.1)
LYMPH#: 0.8 10*3/uL — ABNORMAL LOW (ref 0.9–3.3)
LYMPH%: 30.2 % (ref 14.0–48.0)
MCH: 31.6 pg (ref 28.0–33.4)
MCHC: 34.2 g/dL (ref 32.0–35.9)
MCV: 92 fL (ref 82–98)
MONO#: 0.3 10*3/uL (ref 0.1–0.9)
MONO%: 11.6 % (ref 0.0–13.0)
NEUT#: 1.4 10*3/uL — ABNORMAL LOW (ref 1.5–6.5)
NEUT%: 52.9 % (ref 40.0–80.0)
Platelets: 138 10*3/uL — ABNORMAL LOW (ref 145–400)
RBC: 4.02 10*6/uL — ABNORMAL LOW (ref 4.20–5.70)
RDW: 13 % (ref 11.1–15.7)
WBC: 2.7 10*3/uL — ABNORMAL LOW (ref 4.0–10.0)

## 2015-02-04 LAB — CMP (CANCER CENTER ONLY)
ALT(SGPT): 40 U/L (ref 10–47)
AST: 31 U/L (ref 11–38)
Albumin: 4.2 g/dL (ref 3.3–5.5)
Alkaline Phosphatase: 65 U/L (ref 26–84)
BILIRUBIN TOTAL: 1.9 mg/dL — AB (ref 0.20–1.60)
BUN: 10 mg/dL (ref 7–22)
CHLORIDE: 102 meq/L (ref 98–108)
CO2: 30 mEq/L (ref 18–33)
CREATININE: 1.1 mg/dL (ref 0.6–1.2)
Calcium: 11.1 mg/dL — ABNORMAL HIGH (ref 8.0–10.3)
Glucose, Bld: 97 mg/dL (ref 73–118)
Potassium: 4.3 mEq/L (ref 3.3–4.7)
Sodium: 139 mEq/L (ref 128–145)
Total Protein: 8 g/dL (ref 6.4–8.1)

## 2015-02-04 LAB — CREATININE, SERUM
Creatinine, Ser: 1.22 mg/dL (ref 0.61–1.24)
GFR calc Af Amer: >60 mL/min (ref >60)

## 2015-02-04 LAB — CBC
HCT: 34.6 % — ABNORMAL LOW (ref 39.0–52.0)
Hemoglobin: 11.2 g/dL — ABNORMAL LOW (ref 13.0–17.0)
MCH: 29.6 pg (ref 26.0–34.0)
MCHC: 32.4 g/dL (ref 30.0–36.0)
MCV: 91.3 fL (ref 78.0–100.0)
PLATELETS: 119 10*3/uL — AB (ref 150–400)
RBC: 3.79 MIL/uL — ABNORMAL LOW (ref 4.22–5.81)
RDW: 13.7 % (ref 11.5–15.5)
WBC: 6 10*3/uL (ref 4.0–10.5)

## 2015-02-04 MED ORDER — POTASSIUM CHLORIDE IN NACL 20-0.9 MEQ/L-% IV SOLN
INTRAVENOUS | Status: DC
  Administered 2015-02-04 – 2015-02-05 (×2): via INTRAVENOUS
  Filled 2015-02-04 (×3): qty 1000

## 2015-02-04 MED ORDER — ZOLEDRONIC ACID 4 MG/100ML IV SOLN
4.0000 mg | Freq: Once | INTRAVENOUS | Status: AC
  Administered 2015-02-04: 4 mg via INTRAVENOUS
  Filled 2015-02-04: qty 100

## 2015-02-04 MED ORDER — TEMAZEPAM 22.5 MG PO CAPS: 22.5000 mg | ORAL_CAPSULE | Freq: Every evening | ORAL | Status: DC | PRN

## 2015-02-04 MED ORDER — HYDROMORPHONE HCL 1 MG/ML IJ SOLN
INTRAMUSCULAR | Status: AC
  Filled 2015-02-04: qty 2

## 2015-02-04 MED ORDER — HYDROMORPHONE HCL 4 MG/ML IJ SOLN
INTRAMUSCULAR | Status: AC
  Filled 2015-02-04: qty 1

## 2015-02-04 MED ORDER — IOHEXOL 300 MG/ML  SOLN
100.0000 mL | Freq: Once | INTRAMUSCULAR | Status: AC | PRN
Start: 2015-02-04 — End: 2015-02-04
  Administered 2015-02-04: 100 mL via INTRAVENOUS

## 2015-02-04 MED ORDER — PANTOPRAZOLE SODIUM 40 MG PO TBEC: 40.0000 mg | DELAYED_RELEASE_TABLET | Freq: Every day | ORAL | Status: DC

## 2015-02-04 MED ORDER — KETOROLAC TROMETHAMINE 30 MG/ML IJ SOLN
30.0000 mg | Freq: Three times a day (TID) | INTRAMUSCULAR | Status: AC
  Administered 2015-02-04 – 2015-02-06 (×6): 30 mg via INTRAVENOUS
  Filled 2015-02-04 (×7): qty 1

## 2015-02-04 MED ORDER — ONDANSETRON HCL 4 MG/2ML IJ SOLN: 8.0000 mg | Freq: Four times a day (QID) | INTRAMUSCULAR | Status: DC | PRN

## 2015-02-04 MED ORDER — SODIUM CHLORIDE 0.9 % IV SOLN
INTRAVENOUS | Status: DC
  Administered 2015-02-04: 13:00:00 via INTRAVENOUS

## 2015-02-04 MED ORDER — ONDANSETRON 8 MG PO TBDP: 8.0000 mg | ORAL_TABLET | Freq: Three times a day (TID) | ORAL | Status: AC | PRN

## 2015-02-04 MED ORDER — MORPHINE SULFATE 4 MG/ML IJ SOLN
4.0000 mg | INTRAMUSCULAR | Status: DC | PRN
  Administered 2015-02-05 – 2015-02-07 (×2): 4 mg via INTRAVENOUS
  Filled 2015-02-04 (×2): qty 1

## 2015-02-04 MED ORDER — HYDROMORPHONE HCL 4 MG/ML IJ SOLN
4.0000 mg | INTRAMUSCULAR | Status: DC | PRN
  Administered 2015-02-04: 4 mg via INTRAVENOUS

## 2015-02-04 MED ORDER — PANTOPRAZOLE SODIUM 40 MG IV SOLR
40.0000 mg | Freq: Two times a day (BID) | INTRAVENOUS | Status: DC
  Administered 2015-02-04 – 2015-02-06 (×4): 40 mg via INTRAVENOUS
  Filled 2015-02-04 (×6): qty 40

## 2015-02-04 MED ORDER — ENOXAPARIN SODIUM 40 MG/0.4ML ~~LOC~~ SOLN
40.0000 mg | SUBCUTANEOUS | Status: DC
  Administered 2015-02-04: 40 mg via SUBCUTANEOUS
  Filled 2015-02-04 (×3): qty 0.4

## 2015-02-04 MED ORDER — SODIUM CHLORIDE 0.9 % IV SOLN
40.0000 mg | Freq: Once | INTRAVENOUS | Status: AC
  Administered 2015-02-04: 40 mg via INTRAVENOUS
  Filled 2015-02-04: qty 4

## 2015-02-04 NOTE — Progress Notes (Signed)
Patient discharged and portacath discontinued.  Patient went to restroom and vomited for 3-5 minutes.  Dr. Marin Olp here to check on patient.  Decided to admit patient.

## 2015-02-04 NOTE — Patient Instructions (Signed)

## 2015-02-04 NOTE — H&P (Signed)
Referral MD  Reason for Referral: Nausea and vomiting. Recurrent Kappa Lightchain myeloma.   No chief complaint on file. : I have been nauseated and throwing up for the past 2 weeks.Marland Kitchen  HPI: Mark [name] is a 44 year old African-American male. He has Kappa Lightchain myeloma. He did undergo some cell transplantation at [facility] back in February 2015.  He's had a hard time lately. He's having more pain. 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 He had radiation for this.  He's having a lot more pain with his sternum. He is having more pain with his hips. I believe that his myeloma has recurred.  He was in the office today. He was having a lot of nausea and vomiting. He had IV fluids. We cannot control this with IV anti-emetics area and as such, he is being admitted for management. He was somewhat dehydrated. His calcium level was on the high side. When he first presented with myeloma, his calcium level was high.  He has had a low-grade fever. He has not had any urinary symptoms. He's had no diarrhea. He's had no rashes.  There's been no cough. He's had no shortness of breath.  His weight is dropping. His appetite is decreased because of the nausea and vomiting.  He's had no headache. He's had no visual issues.  Because of the nausea and vomiting and the dehydration, he is being admitted for IV fluids and interventions to try to help with his status and likely recurrent myeloma.   Past Medical History  Diagnosis Date  . History of radiation therapy 02/07/13- 02/26/13    lower L spine, upper sacrum, 35 gray in 14 fractions  . FH: chemotherapy     Dr. Burney Gauze  . Cancer   . Multiple myeloma dx'd 01/2013  :  Past Surgical History  Procedure Laterality Date  . Portacath placement    . Bone marrow transplant  09/12/13    [facility]  . Kyphoplasty  05/20/14  . Lumbar laminectomy/decompression microdiscectomy N/A 09/25/2014    Procedure: LUMBAR  LAMINECTOMY/DECOMPRESSION MICRODISCECTOMY;  Surgeon: Sinclair Ship, MD;  Location: [place];  Service: Orthopedics;  Laterality: N/A;  Lumbar 4-5, L5-S1  decompression  :  No current facility-administered medications for this encounter.:  :  Allergies  Allergen Reactions  . Gadolinium Derivatives Nausea And Vomiting    Pt became very nauseous and got sick.   :  Family History  Problem Relation Age of Onset  . Diabetic kidney disease Mother   . Hypertension Mother   . Heart attack Mother   . Kidney failure Mother   . Coronary artery disease Mother   . HIV Father   :  History   Social History  . Marital Status: Married    Spouse Name: N/A  . Number of Children: N/A  . Years of Education: N/A   Occupational History  . Not on file.   Social History Main Topics  . Smoking status: Former Smoker -- 1.00 packs/day for 15 years    Types: Cigarettes    Start date: 06/29/1988    Quit date: 06/29/2003  . Smokeless tobacco: Never Used     Comment: quit smoking 10 years ago  . Alcohol Use: No  . Drug Use: No  . Sexual Activity: No   Other Topics Concern  . Not on file   Social History Narrative  :  Pertinent items are noted in HPI.  Exam: No data found.  well developed and well nourished African-American gentleman. He does appear somewhat ill. His vital signs show temperature 98.5. Pulse 99. Blood pressure 117/88. Weight is 197 pounds. Head and neck exam shows no ocular or oral lesions. Oral mucosa is dry. Neck is supple with no adenopathy. Pupils react appropriately. Lungs are clear. Cardiac exam regular rate and rhythm with no murmurs, rubs or bruits. Abdomen is soft. Bowel sounds are decreased. He has no guarding or rebound tenderness. Is no palpable liver or spleen tip. Back exam shows some tenderness over the lumbar spine and sacroiliac region bilaterally. Chest wall exam shows tenderness over the sternum. Extremities shows no clubbing, cyanosis or edema. Skin  exam shows skin to be somewhat dry. Neurological exam is nonfocal.    Recent Labs  02/04/15 1124  WBC 2.7*  HGB 12.7*  HCT 37.1*  PLT 138*    Recent Labs  02/04/15 1124  NA 139  K 4.3  CL 102  CO2 30  GLUCOSE 97  BUN 10  CREATININE 1.1  CALCIUM 11.1*    Blood smear review: None  Pathology: None     Assessment and Plan: Mark [name] is a 44 year old African American male. He has Kappa light chain myeloma. Again, I really think that his disease has returned.  I would have to get some studies done on him as an inpatient. I think he needs to have a CT of the brain to see that there is no cranial issues that are causing this nausea and vomiting. Sometimes, with recurrent myeloma, one can see plasmacytomas in the brain.  I probably will need to get a bone marrow test on him. I want to get a 24-hour urine on him.  We will get a bone survey.  I will access his Port-A-Cath. He will have IV fluids. We'll give him antibiotics.  He, at the beginning, had aggressive myeloma. It took [place] a while to get him into remission. As such, I think that it was certainly is not surprising that he has relapsed.  For now, he is a full code.

## 2015-02-04 NOTE — Progress Notes (Signed)
Hematology and Oncology Follow Up Visit  [name] [id number] 02/14/1971 44 y.o. 02/04/2015   Principle Diagnosis:   Kappa light chain myeloma -probable recurrence  Current Therapy:   Revlimid 10mg po q day (21/7) -  on hold Zometa 4 mg IV q. month Velcade q 3 week dosing -  on hold     Interim History:  [name] is back for followup. Unfortunately, his status continues to decline. He is having more pain. He's having quite a bit of sternal pain. He's having bilateral hip pain.  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 His does not seem as if this has done much for him.  His last myeloma studies back in May showed 2 monoclonal spikes. They total 0.4 g/dL. His Kappa Lightchain was 2.98 mg/dL. This is gone up a little bit. I am running a 24-hour urine on him today.  I do not think he is going to go to work. He just has some much pain. Again, I worry that his myeloma has come back. Light chain myeloma tends to come back and be more aggressive and involved the bones.  We're going to have to get him reevaluated completely. He'll need another bone marrow test. His last bone marrow biopsy was back in July last year. At that point time, he had not percent plasma cells. He will need a bone survey. He will need a PET scan to see what type of bone activity we are dealing with.  Today, he will get some IV Decadron along with the Zometa. I will also give him some extra IV fluid.  I just feel bad for him. He is having nausea.. He's had no vomiting. Is not sleeping well. He's not having any issues going to the bathroom. There has been no leg swelling. She had no rashes. He's had some low-grade temperatures.  He has been off Velcade and Revlimid now for close to 6-8 weeks area  Overall, his performance status is ECOG 1.  Medications:  Current outpatient prescriptions:  .  acetaminophen (TYLENOL) 500 MG tablet, Take 1,000 mg by mouth every 6 (six) hours as needed for  moderate pain or headache (headache). , Disp: , Rfl:  .  aspirin EC 325 MG tablet, Take 325 mg by mouth daily., Disp: , Rfl:  .  bisacodyl (DULCOLAX) 5 MG EC tablet, Take 5 mg by mouth daily as needed for moderate constipation (constipation). , Disp: , Rfl:  .  fentaNYL (DURAGESIC - DOSED MCG/HR) 75 MCG/HR, Place 1 patch (75 mcg total) onto the skin every 3 (three) days., Disp: 10 patch, Rfl: 0 .  gabapentin (NEURONTIN) 300 MG capsule, Take 2 capsules (600 mg total) by mouth 3 (three) times daily., Disp: 180 capsule, Rfl: 3 .  metoCLOPramide (REGLAN) 10 MG tablet, Take 1 tablet (10 mg total) by mouth every 6 (six) hours as needed for nausea or vomiting., Disp: 20 tablet, Rfl: 0 .  ondansetron (ZOFRAN ODT) 8 MG disintegrating tablet, Take 1 tablet (8 mg total) by mouth every 8 (eight) hours as needed for nausea or vomiting., Disp: 20 tablet, Rfl: 0 .  oxyCODONE-acetaminophen (PERCOCET) 10-325 MG per tablet, Take 1 tablet by mouth every 4 (four) hours as needed for pain., Disp: 120 tablet, Rfl: 0 .  Oyster Shell (OYSTER CALCIUM) 500 MG TABS tablet, Take 500 mg of elemental calcium by mouth daily., Disp: , Rfl:  .  pantoprazole (PROTONIX) 40 MG tablet, Take 1 tablet (40 mg total) by mouth   daily., Disp: 30 tablet, Rfl: 1 .  Tapentadol HCl (NUCYNTA) 75 MG TABS, Take 1 tablet (75 mg total) by mouth every 4 (four) hours as needed. (Patient taking differently: Take 75 mg by mouth every 4 (four) hours as needed (pain). ), Disp: 90 tablet, Rfl: 0 .  lenalidomide (REVLIMID) 10 MG capsule, Take 10 mg by mouth daily., Disp: , Rfl:   Current facility-administered medications:  .  0.9 %  sodium chloride infusion, , Intravenous, Continuous, [name], MD .  dexamethasone (DECADRON) 40 mg in sodium chloride 0.9 % 50 mL IVPB, 40 mg, Intravenous, Once, [name], MD .  HYDROmorphone (DILAUDID) injection 4 mg, 4 mg, Intravenous, Q4H PRN, [name], MD  Facility-Administered Medications Ordered in  Other Visits:  .  zolendronic acid (ZOMETA) 4 mg in sodium chloride 0.9 % 100 mL IVPB, 4 mg, Intravenous, Once, [name], MD  Allergies:  Allergies  Allergen Reactions  . Gadolinium Derivatives Nausea And Vomiting    Pt became very nauseous and got sick.     Past Medical History, Surgical history, Social history, and Family History were reviewed and updated.  Review of Systems: As above  Physical Exam:  height is 5' 10" (1.778 m) and weight is 197 lb (89.359 kg). His oral temperature is 98.5 F (36.9 C). His blood pressure is 117/88 and his pulse is 99. His respiration is 18.   Well-developed and well-nourished African American gentleman. He is alert and oriented x3. Vital signs are stable. Head and neck exam shows no ocular or oral lesions. He has no palpable cervical or supraclavicular lymph nodes. Oral cavity is totally dry. Lungs are clear. Cardiac exam regular rate and rhythm with no murmurs rubs or bruits. Abdomen is soft. He has good bowel sounds. There is no fluid wave. There is no palpable liver or spleen tip. Back exam shows no tenderness over the spine or ribs. He is a pain in the right hip. There is pain with flexion and extension he has significant pain with hip abduction and hip abduction.. Extremities shows no clubbing cyanosis or edema.  He has good range of motion of his knees. There is no swelling of his knee joint. Skin exam no rashes. Neurological exam is nonfocal. He has good strength in his legs and feet. Massive slight decrease to sensation in his feet and lower legs. There is no weakness.  Lab Results  Component Value Date   WBC 2.7* 02/04/2015   HGB 12.7* 02/04/2015   HCT 37.1* 02/04/2015   MCV 92 02/04/2015   PLT 138* 02/04/2015     Chemistry      Component Value Date/Time   NA 139 02/04/2015 1124   NA 138 01/27/2015 0220   NA 145 02/21/2013 0908   K 4.3 02/04/2015 1124   K 3.7 01/27/2015 0220   K 3.8 02/21/2013 0908   CL 102 02/04/2015 1124    CL 104 01/27/2015 0220   CO2 30 02/04/2015 1124   CO2 25 01/27/2015 0220   CO2 32* 02/21/2013 0908   BUN 10 02/04/2015 1124   BUN 15 01/27/2015 0220   BUN 19.5 02/21/2013 0908   CREATININE 1.1 02/04/2015 1124   CREATININE 1.13 01/27/2015 0220   CREATININE 1.5* 02/21/2013 0908      Component Value Date/Time   CALCIUM 11.1* 02/04/2015 1124   CALCIUM 9.5 01/27/2015 0220   CALCIUM 17.7 Repeated and Verified* 02/21/2013 0908   CALCIUM >15.0* 02/01/2013 1230   ALKPHOS 65  02/04/2015 1124   ALKPHOS 58 01/27/2015 0220   AST 31 02/04/2015 1124   AST 33 01/27/2015 0220   ALT 40 02/04/2015 1124   ALT 27 01/27/2015 0220   BILITOT 1.90* 02/04/2015 1124   BILITOT 1.6* 01/27/2015 0220         Impression and Plan: [name] is 43-year-old -year-old African American male. 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 This was in January or early February of 2015. This was done at [facility].  I really believe that he has recurrence area did again, the elevated calcium is a little troublesome. This is how he presented back 2 years ago.  Again, we will have to get him reevaluated. We will be thorough with this.  If we do document recurrence, which I think we will, then we will have to get him started on treatment again. I will have to notify the transplant team down at [facility] regarding what has been going on. I would think that he would be a candidate for an allogeneic transplant if inappropriate match could be found.  Again, I think he is going to have to be out of work. I do not see how he is going to be able to work given all the bony issues that he has.  I will try him on some oral Decadron to see if this can help with some of the pain that he has.  I will try to get all of his tests set up in the next week. I will then plan to get him back.  I spent a good 45 minutes with him. This truly is complicated and I just have a bad feeling that we will fine that his myeloma has  returned.    ENNEVER,PETER R, MD 7/6/201612:49 PM 

## 2015-02-04 NOTE — Addendum Note (Signed)
Addended by: Burney Gauze R on: 02/04/2015 04:08 PM   Modules accepted: Orders

## 2015-02-04 NOTE — Progress Notes (Signed)
4:00 PM Pt to be admitted d/t nausea, vomiting, and pain. Discharged with wife via [place].

## 2015-02-05 ENCOUNTER — Telehealth: Payer: Self-pay | Admitting: Hematology & Oncology

## 2015-02-05 ENCOUNTER — Inpatient Hospital Stay (HOSPITAL_COMMUNITY): Payer: BLUE CROSS/BLUE SHIELD

## 2015-02-05 LAB — DIFFERENTIAL
BASOS ABS: 0 10*3/uL (ref 0.0–0.1)
BASOS PCT: 0 % (ref 0–1)
EOS ABS: 0 10*3/uL (ref 0.0–0.7)
EOS PCT: 0 % (ref 0–5)
Lymphocytes Relative: 12 % (ref 12–46)
Lymphs Abs: 0.5 10*3/uL — ABNORMAL LOW (ref 0.7–4.0)
Monocytes Absolute: 0.1 10*3/uL (ref 0.1–1.0)
Monocytes Relative: 2 % — ABNORMAL LOW (ref 3–12)
NEUTROS ABS: 3.5 10*3/uL (ref 1.7–7.7)
Neutrophils Relative %: 86 % — ABNORMAL HIGH (ref 43–77)

## 2015-02-05 LAB — COMPREHENSIVE METABOLIC PANEL
ALK PHOS: 59 U/L (ref 38–126)
ALT: 31 U/L (ref 17–63)
ANION GAP: 7 (ref 5–15)
AST: 29 U/L (ref 15–41)
Albumin: 4.3 g/dL (ref 3.5–5.0)
BUN: 16 mg/dL (ref 6–20)
CALCIUM: 10.4 mg/dL — AB (ref 8.9–10.3)
CO2: 25 mmol/L (ref 22–32)
CREATININE: 1.22 mg/dL (ref 0.61–1.24)
Chloride: 104 mmol/L (ref 101–111)
Glucose, Bld: 154 mg/dL — ABNORMAL HIGH (ref 65–99)
Potassium: 5.1 mmol/L (ref 3.5–5.1)
Sodium: 136 mmol/L (ref 135–145)
Total Bilirubin: 1.5 mg/dL — ABNORMAL HIGH (ref 0.3–1.2)
Total Protein: 7.4 g/dL (ref 6.5–8.1)

## 2015-02-05 LAB — CBC
HEMATOCRIT: 32.7 % — AB (ref 39.0–52.0)
HEMOGLOBIN: 10.8 g/dL — AB (ref 13.0–17.0)
MCH: 30.2 pg (ref 26.0–34.0)
MCHC: 33 g/dL (ref 30.0–36.0)
MCV: 91.3 fL (ref 78.0–100.0)
Platelets: 131 10*3/uL — ABNORMAL LOW (ref 150–400)
RBC: 3.58 MIL/uL — AB (ref 4.22–5.81)
RDW: 13.6 % (ref 11.5–15.5)
WBC: 4.2 10*3/uL (ref 4.0–10.5)

## 2015-02-05 LAB — URINALYSIS, ROUTINE W REFLEX MICROSCOPIC
Bilirubin Urine: NEGATIVE
GLUCOSE, UA: NEGATIVE mg/dL
HGB URINE DIPSTICK: NEGATIVE
KETONES UR: NEGATIVE mg/dL
Leukocytes, UA: NEGATIVE
Nitrite: NEGATIVE
PROTEIN: NEGATIVE mg/dL
Specific Gravity, Urine: 1.036 — ABNORMAL HIGH (ref 1.005–1.030)
UROBILINOGEN UA: 1 mg/dL (ref 0.0–1.0)
pH: 6 (ref 5.0–8.0)

## 2015-02-05 LAB — IGG, IGA, IGM
IGA: 71 mg/dL (ref 68–379)
IGM, SERUM: 21 mg/dL — AB (ref 41–251)
IgG (Immunoglobin G), Serum: 1400 mg/dL (ref 650–1600)

## 2015-02-05 MED ORDER — METOCLOPRAMIDE HCL 10 MG PO TABS: 10.0000 mg | ORAL_TABLET | Freq: Four times a day (QID) | ORAL | Status: DC | PRN

## 2015-02-05 MED ORDER — SODIUM CHLORIDE 0.9 % IV SOLN
INTRAVENOUS | Status: DC
  Administered 2015-02-05 – 2015-02-08 (×4): via INTRAVENOUS

## 2015-02-05 MED ORDER — ZOLEDRONIC ACID 4 MG/5ML IV CONC
4.0000 mg | Freq: Once | INTRAVENOUS | Status: DC
Start: 2015-02-05 — End: 2015-02-05

## 2015-02-05 MED ORDER — LACTULOSE 10 GM/15ML PO SOLN
20.0000 g | Freq: Three times a day (TID) | ORAL | Status: DC
  Administered 2015-02-05: 20 g via ORAL
  Filled 2015-02-05 (×12): qty 30

## 2015-02-05 MED ORDER — ONDANSETRON 4 MG PO TBDP
8.0000 mg | ORAL_TABLET | Freq: Three times a day (TID) | ORAL | Status: DC | PRN
  Filled 2015-02-05: qty 2

## 2015-02-05 MED ORDER — GABAPENTIN 300 MG PO CAPS
600.0000 mg | ORAL_CAPSULE | Freq: Three times a day (TID) | ORAL | Status: DC
  Administered 2015-02-05 – 2015-02-08 (×8): 600 mg via ORAL
  Filled 2015-02-05 (×12): qty 2

## 2015-02-05 MED ORDER — FENTANYL 75 MCG/HR TD PT72
75.0000 ug | MEDICATED_PATCH | TRANSDERMAL | Status: DC
  Administered 2015-02-05 – 2015-02-08 (×2): 75 ug via TRANSDERMAL
  Filled 2015-02-05 (×2): qty 1

## 2015-02-05 MED ORDER — TEMAZEPAM 7.5 MG PO CAPS: 22.5000 mg | ORAL_CAPSULE | Freq: Every evening | ORAL | Status: DC | PRN

## 2015-02-05 MED ORDER — ENSURE ENLIVE PO LIQD: 237.0000 mL | Freq: Two times a day (BID) | ORAL | Status: DC

## 2015-02-05 NOTE — Progress Notes (Signed)
Initial Nutrition Assessment  DOCUMENTATION CODES:  Severe malnutrition in context of acute illness/injury  INTERVENTION:  Ensure Enlive (each supplement provides 350kcal and 20 grams of protein) BID  NUTRITION DIAGNOSIS:  Malnutrition related to acute illness as evidenced by percent weight loss, energy intake < or equal to 50% for > or equal to 5 days.  GOAL:  Patient will meet greater than or equal to 90% of their needs  MONITOR:  PO intake, Supplement acceptance, Labs, Weight trends, Skin, I & O's  REASON FOR ASSESSMENT:  Malnutrition Screening Tool    ASSESSMENT: 44 year old African-American male. He has Kappa Lightchain myeloma. He did undergo some cell transplantation at [facility] back in February 2015.   Pt reports N/V x 2-3 weeks PTA. Pt was unable to keep any food down during this time. Pt has been ordered full liquids. Pt states he ate yogurt and oatmeal this morning with no problems.   Pt has lost 14 lb over the last month (7% weight loss x 1 month, significant for time frame).  Nutrition focused physical exam shows no sign of depletion of muscle mass or body fat.  Labs reviewed.  Height:  Ht Readings from Last 1 Encounters:  02/04/15 5\' 10"  (1.778 m)    Weight:  Wt Readings from Last 1 Encounters:  02/05/15 198 lb 6.4 oz (89.994 kg)    Ideal Body Weight:  75.5 kg  Wt Readings from Last 10 Encounters:  02/05/15 198 lb 6.4 oz (89.994 kg)  02/04/15 197 lb (89.359 kg)  01/14/15 205 lb (92.987 kg)  01/06/15 212 lb 1.6 oz (96.208 kg)  01/01/15 215 lb (97.523 kg)  12/27/14 219 lb (99.338 kg)  12/25/14 218 lb 8 oz (99.111 kg)  12/12/14 210 lb (95.255 kg)  12/09/14 213 lb (96.616 kg)  11/20/14 206 lb (93.441 kg)    BMI:  Body mass index is 28.47 kg/(m^2).  Estimated Nutritional Needs:  Kcal:  2100-2300  Protein:  100-110g  Fluid:  2.1L/day    Skin:  Reviewed, no issues  Diet Order:  Diet full liquid Room service appropriate?: Yes;  Fluid consistency:: Thin  EDUCATION NEEDS:  No education needs identified at this time  No intake or output data in the 24 hours ending 02/05/15 1101  Last BM:  7/6  Clayton Bibles, MS, RD, LDN Pager: [phone] After Hours Pager: [phone]

## 2015-02-05 NOTE — Telephone Encounter (Signed)
Contacted [facility] scheduling and they will contact pt when they make the appt

## 2015-02-05 NOTE — Progress Notes (Signed)
Mark [name] had some vomiting last night. His brain scan was okay. There is nothing on the brain scan looked suspicious. We did an abdominal film on him. There is no obvious obstruction.  I will see about doing an upper GI with small bowel follow-through. This might give [place] an idea as was going on.  He is not hurting as much. I do have on some scheduled Toradol.  He's constipated. We will see about some lactulose for the constipation.   There's been no fever. He's had no cough. He's had no leg swelling.  He is due for his bone survey today. We will see what that shows regarding his sternum.  He will need a  Bone marrow biopsy. I will see if radiology can do this for [place] tomorrow.  [name], his vital signs are all stable. Temperature 98.3. Pulse 81. Blood pressure 129/62. Head and neck exam shows no ocular or oral lesions. Oral mucosa is some more moist. Lungs are clear. Cardiac exam regular rate and rhythm with no murmurs, rubs or bruits. Abdomen is soft. He has slightly decreased bowel sounds. There is no fluid wave. There is no palpable liver or spleen tip.extremities shows no clubbing, cyanosis or edema.  I'm going to start a 24 hour urine on him.  We will see what the upper GI shows [place].  I still feel that there is recurrent myeloma. How this is affecting or causing his nausea and vomiting is not clear.   I appreciate all the great care that he is getting from everybody on the floor on 3 [address].  Pete E.  Rodman Key 11:28-29

## 2015-02-05 NOTE — Progress Notes (Signed)
Patient ID: Mark [name], male   DOB: 1970/11/23, 44 y.o.   MRN: [id number]    Referring Physician(s): Ennever  Subjective: Patient doing fair today. Still has some intermittent nausea and vomiting, occasional left upper chest discomfort, mild abdominal and back pain. He is familiar to IR service from prior bone marrow biopsy as well as T12 Coblation (05/2014). He has known history of treated myeloma and request has now been received for CT-guided bone marrow biopsy to rule out relapse.    Allergies: Gadolinium derivatives  Medications: Prior to Admission medications   Medication Sig Start Date End Date Taking? Authorizing Provider  acetaminophen (TYLENOL) 500 MG tablet Take 1,000 mg by mouth every 6 (six) hours as needed for moderate pain or headache (headache).    Yes Historical Provider, MD  bisacodyl (DULCOLAX) 5 MG EC tablet Take 5 mg by mouth daily as needed for moderate constipation (constipation).    Yes Historical Provider, MD  fentaNYL (DURAGESIC - DOSED MCG/HR) 75 MCG/HR Place 1 patch (75 mcg total) onto the skin every 3 (three) days. 12/09/14  Yes [name], MD  gabapentin (NEURONTIN) 300 MG capsule Take 2 capsules (600 mg total) by mouth 3 (three) times daily. 11/20/14  Yes [name], NP  metoCLOPramide (REGLAN) 10 MG tablet Take 1 tablet (10 mg total) by mouth every 6 (six) hours as needed for nausea or vomiting. 01/27/15  Yes [name], MD  ondansetron (ZOFRAN ODT) 8 MG disintegrating tablet Take 1 tablet (8 mg total) by mouth every 8 (eight) hours as needed for nausea or vomiting. 02/04/15  Yes [name], MD  oxyCODONE-acetaminophen (PERCOCET) 10-325 MG per tablet Take 1 tablet by mouth every 4 (four) hours as needed for pain. 08/28/14  Yes [name], MD  pantoprazole (PROTONIX) 40 MG tablet Take 1 tablet (40 mg total) by mouth daily. 02/04/15  Yes [name], MD  zolendronic acid (ZOMETA) 4 MG/5ML injection Inject 4 mg into the vein once.   Yes  Historical Provider, MD  temazepam (RESTORIL) 22.5 MG capsule Take 1 capsule (22.5 mg total) by mouth at bedtime as needed for sleep. 02/04/15   [name], MD     Vital Signs: BP 129/62 mmHg  Pulse 81  Temp(Src) 98.3 F (36.8 C) (Oral)  Resp 20  Ht '5\' 10"'  (1.778 m)  Wt 198 lb 6.4 oz (89.994 kg)  BMI 28.47 kg/m2  SpO2 100%  Physical Exam pt awake/alert; chest- CTA bilat; heart- RRR; abd- soft,+BS, now diffuse tenderness; extremities with full range of motion and no significant edema  Imaging: Ct Head W Wo Contrast  02/04/2015   CLINICAL DATA:  Recurrent Kappa light chain myeloma is suspected. Evaluate for intracranial etiology for 2 weeks of nausea and vomiting.  EXAM: CT HEAD WITHOUT AND WITH CONTRAST  TECHNIQUE: Contiguous axial images were obtained from the base of the skull through the vertex without and with intravenous contrast  CONTRAST:  12m OMNIPAQUE IOHEXOL 300 MG/ML  SOLN  COMPARISON:  None.  FINDINGS: No evidence for acute infarction, hemorrhage, mass lesion, hydrocephalus, or extra-axial fluid. There is no atrophy or white matter disease.  Post infusion, no abnormal enhancement is seen. No large vessel occlusion. Major dural venous sinuses are patent. No clear osseous abnormality is evident.  IMPRESSION: Negative exam.  No acute or focal intracranial abnormalities are detected.   Electronically Signed   By: JStaci RighterM.D.   On: 02/04/2015 21:27   Acute Abdominal Series  02/04/2015  CLINICAL DATA:  Upper left chest pain for 1 day  EXAM: DG ABDOMEN ACUTE W/ 1V CHEST  COMPARISON:  11/02/2014 chest radiograph  FINDINGS: There is no evidence of dilated bowel loops or free intraperitoneal air. No radiopaque calculi or other significant radiographic abnormality is seen. Heart size and mediastinal contours are within normal limits. Both lungs are clear. Right-sided Port-A-Cath in place with tip over the right atrium. Retained contrast noted within nondilated renal collecting  systems right greater than left hip degenerative change noted. Previously seen lytic lesion involving the right iliac bone may be visual or could represent overlying bowel gas allowing for radiographic technique. The patient has had posterior decompression at L5.  IMPRESSION: Negative abdominal radiographs.  No acute cardiopulmonary disease.   Electronically Signed   By: Conchita Paris M.D.   On: 02/04/2015 21:33    Labs:  CBC:  Recent Labs  01/27/15 0220 02/04/15 1124 02/04/15 1800 02/05/15 0527  WBC 2.7* 2.7* 6.0 4.2  HGB 11.6* 12.7* 11.2* 10.8*  HCT 34.9* 37.1* 34.6* 32.7*  PLT 121* 138* 119* 131*    COAGS:  Recent Labs  05/20/14 0711 09/18/14 0919  INR 1.06 1.02  APTT 28 28    BMP:  Recent Labs  01/18/15 1850 01/27/15 0220 02/04/15 1124 02/04/15 1800 02/05/15 0527  NA 136 138 139  --  136  K 4.0 3.7 4.3  --  5.1  CL 107 104 102  --  104  CO2 '27 25 30  ' --  25  GLUCOSE 103* 155* 97  --  154*  BUN '18 15 10  ' --  16  CALCIUM 8.8* 9.5 11.1*  --  10.4*  CREATININE 1.22 1.13 1.1 1.22 1.22  GFRNONAA >60 >60  --  >60 >60  GFRAA >60 >60  --  >60 >60    LIVER FUNCTION TESTS:  Recent Labs  01/01/15 1146 01/18/15 1850 01/27/15 0220 02/04/15 1124 02/05/15 0527  BILITOT 1.0 1.8* 1.6* 1.90* 1.5*  AST 28 23 33 31 29  ALT 40 26 27 40 31  ALKPHOS 66 60 58 65 59  PROT 6.9 7.7 7.3 8.0 7.4  ALBUMIN 3.9 4.6 4.4  --  4.3    Assessment and Plan: Patient with probable relapse of kappa light chain myeloma. Plan is for CT-guided bone marrow biopsy on 7/8 for further evaluation. Risks and benefits discussed with the patient including, but not limited to bleeding, infection, damage to adjacent structures or low yield requiring additional tests.All of the patient's questions were answered, patient is agreeable to proceed.Consent signed and in chart.     Signed: D. Rowe Robert 02/05/2015, 11:03 AM   I spent a total of 15 minutes in face to face in clinical  consultation/evaluation, greater than 50% of which was counseling/coordinating care for CT-guided bone marrow biopsy

## 2015-02-06 ENCOUNTER — Ambulatory Visit (HOSPITAL_COMMUNITY): Payer: BLUE CROSS/BLUE SHIELD

## 2015-02-06 ENCOUNTER — Encounter (HOSPITAL_COMMUNITY)
Admission: AD | Disposition: A | Discharge status: Home / Self Care | Payer: BLUE CROSS/BLUE SHIELD | Source: Ambulatory Visit | Attending: Hematology & Oncology

## 2015-02-06 DIAGNOSIS — R1111 Vomiting without nausea: Secondary | ICD-10-CM

## 2015-02-06 HISTORY — PX: ESOPHAGOGASTRODUODENOSCOPY: SHX5428

## 2015-02-06 LAB — KAPPA/LAMBDA LIGHT CHAINS
KAPPA LAMBDA RATIO: 30.83 — AB (ref 0.26–1.65)
Kappa free light chain: 40.7 mg/dL — ABNORMAL HIGH (ref 0.33–1.94)
Lambda Free Lght Chn: 1.32 mg/dL (ref 0.57–2.63)

## 2015-02-06 LAB — CBC WITH DIFFERENTIAL/PLATELET
BASOS PCT: 0 % (ref 0–1)
Basophils Absolute: 0 10*3/uL (ref 0.0–0.1)
EOS PCT: 0 % (ref 0–5)
Eosinophils Absolute: 0 10*3/uL (ref 0.0–0.7)
HCT: 30.3 % — ABNORMAL LOW (ref 39.0–52.0)
HEMOGLOBIN: 10.2 g/dL — AB (ref 13.0–17.0)
LYMPHS ABS: 0.7 10*3/uL (ref 0.7–4.0)
LYMPHS PCT: 14 % (ref 12–46)
MCH: 30.8 pg (ref 26.0–34.0)
MCHC: 33.7 g/dL (ref 30.0–36.0)
MCV: 91.5 fL (ref 78.0–100.0)
MONO ABS: 0.4 10*3/uL (ref 0.1–1.0)
Monocytes Relative: 8 % (ref 3–12)
NEUTROS PCT: 78 % — AB (ref 43–77)
Neutro Abs: 3.9 10*3/uL (ref 1.7–7.7)
PLATELETS: 107 10*3/uL — AB (ref 150–400)
RBC: 3.31 MIL/uL — ABNORMAL LOW (ref 4.22–5.81)
RDW: 13.7 % (ref 11.5–15.5)
WBC: 5 10*3/uL (ref 4.0–10.5)

## 2015-02-06 LAB — PROTEIN ELECTROPHORESIS, SERUM, WITH REFLEX
ABNORMAL PROTEIN BAND1: 0.2 g/dL
ALPHA-2-GLOBULIN: 0.8 g/dL (ref 0.5–0.9)
Abnormal Protein Band2: 0.3 g/dL
Abnormal Protein Band3: NOT DETECTED g/dL
Albumin ELP: 4.6 g/dL (ref 3.8–4.8)
Alpha-1-Globulin: 0.4 g/dL — ABNORMAL HIGH (ref 0.2–0.3)
BETA 2: 0.4 g/dL (ref 0.2–0.5)
BETA GLOBULIN: 0.4 g/dL (ref 0.4–0.6)
GAMMA GLOBULIN: 1.1 g/dL (ref 0.8–1.7)
Total Protein, Serum Electrophoresis: 7.7 g/dL (ref 6.1–8.1)

## 2015-02-06 LAB — COMPREHENSIVE METABOLIC PANEL
ALK PHOS: 53 U/L (ref 38–126)
ALT: 24 U/L (ref 17–63)
ANION GAP: 6 (ref 5–15)
AST: 19 U/L (ref 15–41)
Albumin: 3.8 g/dL (ref 3.5–5.0)
BILIRUBIN TOTAL: 2.2 mg/dL — AB (ref 0.3–1.2)
BUN: 16 mg/dL (ref 6–20)
CO2: 27 mmol/L (ref 22–32)
CREATININE: 1.01 mg/dL (ref 0.61–1.24)
Calcium: 9.2 mg/dL (ref 8.9–10.3)
Chloride: 104 mmol/L (ref 101–111)
GFR calc Af Amer: >60 mL/min (ref >60)
GFR calc non Af Amer: >60 mL/min (ref >60)
Glucose, Bld: 94 mg/dL (ref 65–99)
POTASSIUM: 3.6 mmol/L (ref 3.5–5.1)
SODIUM: 137 mmol/L (ref 135–145)
Total Protein: 6.4 g/dL — ABNORMAL LOW (ref 6.5–8.1)

## 2015-02-06 LAB — IGG, IGA, IGM
IGA: 72 mg/dL (ref 68–379)
IGG (IMMUNOGLOBIN G), SERUM: 1440 mg/dL (ref 650–1600)
IgM, Serum: 22 mg/dL — ABNORMAL LOW (ref 41–251)

## 2015-02-06 LAB — PROTIME-INR
INR: 1.01 (ref 0.00–1.49)
Prothrombin Time: 13.5 seconds (ref 11.6–15.2)

## 2015-02-06 LAB — BONE MARROW EXAM

## 2015-02-06 LAB — IFE INTERPRETATION

## 2015-02-06 LAB — IMMUNOFIXATION, URINE

## 2015-02-06 SURGERY — EGD (ESOPHAGOGASTRODUODENOSCOPY)
Anesthesia: Moderate Sedation

## 2015-02-06 MED ORDER — MIDAZOLAM HCL 10 MG/2ML IJ SOLN
INTRAMUSCULAR | Status: DC | PRN
  Administered 2015-02-06 (×2): 2 mg via INTRAVENOUS

## 2015-02-06 MED ORDER — MIDAZOLAM HCL 2 MG/2ML IJ SOLN
INTRAMUSCULAR | Status: AC
  Filled 2015-02-06: qty 4

## 2015-02-06 MED ORDER — FENTANYL CITRATE (PF) 100 MCG/2ML IJ SOLN
INTRAMUSCULAR | Status: DC | PRN
  Administered 2015-02-06 (×2): 25 ug via INTRAVENOUS

## 2015-02-06 MED ORDER — MORPHINE SULFATE 15 MG PO TABS
15.0000 mg | ORAL_TABLET | ORAL | Status: DC | PRN
  Administered 2015-02-07 – 2015-02-08 (×2): 15 mg via ORAL
  Filled 2015-02-06 (×4): qty 1

## 2015-02-06 MED ORDER — ONDANSETRON HCL 4 MG/2ML IJ SOLN: 8.0000 mg | Freq: Four times a day (QID) | INTRAMUSCULAR | Status: DC | PRN

## 2015-02-06 MED ORDER — FENTANYL CITRATE (PF) 100 MCG/2ML IJ SOLN
INTRAMUSCULAR | Status: AC
  Filled 2015-02-06: qty 2

## 2015-02-06 MED ORDER — DEXAMETHASONE SODIUM PHOSPHATE 10 MG/ML IJ SOLN: 40.0000 mg | Freq: Once | INTRAMUSCULAR | Status: DC

## 2015-02-06 MED ORDER — MIDAZOLAM HCL 2 MG/2ML IJ SOLN
INTRAMUSCULAR | Status: AC | PRN
  Administered 2015-02-06: 0.5 mg via INTRAVENOUS
  Administered 2015-02-06: 1 mg via INTRAVENOUS

## 2015-02-06 MED ORDER — BUTAMBEN-TETRACAINE-BENZOCAINE 2-2-14 % EX AERO
INHALATION_SPRAY | CUTANEOUS | Status: DC | PRN
  Administered 2015-02-06: 2 via TOPICAL

## 2015-02-06 MED ORDER — SODIUM CHLORIDE 0.9 % IV SOLN
40.0000 mg | Freq: Once | INTRAVENOUS | Status: AC
  Administered 2015-02-06: 40 mg via INTRAVENOUS
  Filled 2015-02-06: qty 4

## 2015-02-06 MED ORDER — MIDAZOLAM HCL 5 MG/ML IJ SOLN
INTRAMUSCULAR | Status: AC
  Filled 2015-02-06: qty 2

## 2015-02-06 MED ORDER — SODIUM CHLORIDE 0.9 % IV SOLN
8.0000 mg | Freq: Four times a day (QID) | INTRAVENOUS | Status: DC | PRN
  Filled 2015-02-06: qty 4

## 2015-02-06 MED ORDER — PANTOPRAZOLE SODIUM 40 MG PO TBEC
40.0000 mg | DELAYED_RELEASE_TABLET | Freq: Two times a day (BID) | ORAL | Status: DC
  Administered 2015-02-06 – 2015-02-08 (×4): 40 mg via ORAL
  Filled 2015-02-06 (×6): qty 1

## 2015-02-06 MED ORDER — FENTANYL CITRATE (PF) 100 MCG/2ML IJ SOLN
INTRAMUSCULAR | Status: AC | PRN
  Administered 2015-02-06 (×2): 25 ug via INTRAVENOUS

## 2015-02-06 NOTE — Consult Note (Signed)
[facility] Consult Note  Referring Provider: No ref. provider found Primary Care Physician:  No PCP Per Patient Primary Gastroenterologist:  Dr.  Laurel Dimmer Complaint: Nausea and vomiting HPI: Mark [name] is an 44 y.o. black male  with history of recurrent documented multiple myeloma. Has had intermittent nausea and vomiting for approximately 3 weeks minimal abdominal and chest  pain. He had a CT-guided bone marrow biopsy today. He is currently without nausea or pain. He had a brain scan recently which was negative. As no history of peptic ulcer disease or prior EGD.  Past Medical History  Diagnosis Date  . History of radiation therapy 02/07/13- 02/26/13    lower L spine, upper sacrum, 35 gray in 14 fractions  . FH: chemotherapy     Dr. Burney Gauze  . Cancer   . Multiple myeloma dx'd 01/2013    Past Surgical History  Procedure Laterality Date  . Portacath placement    . 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 Kyphoplasty  05/20/14  . Lumbar laminectomy/decompression microdiscectomy N/A 09/25/2014    Procedure: LUMBAR LAMINECTOMY/DECOMPRESSION MICRODISCECTOMY;  Surgeon: Sinclair Ship, MD;  Location: [facility];  Service: Orthopedics;  Laterality: N/A;  Lumbar 4-5, L5-S1  decompression    Medications Prior to Admission  Medication Sig Dispense Refill  . acetaminophen (TYLENOL) 500 MG tablet Take 1,000 mg by mouth every 6 (six) hours as needed for moderate pain or headache (headache).     . bisacodyl (DULCOLAX) 5 MG EC tablet Take 5 mg by mouth daily as needed for moderate constipation (constipation).     . fentaNYL (DURAGESIC - DOSED MCG/HR) 75 MCG/HR Place 1 patch (75 mcg total) onto the skin every 3 (three) days. 10 patch 0  . gabapentin (NEURONTIN) 300 MG capsule Take 2 capsules (600 mg total) by mouth 3 (three) times daily. 180 capsule 3  . metoCLOPramide (REGLAN) 10 MG tablet Take 1 tablet (10 mg total) by mouth every 6 (six) hours as needed for nausea  or vomiting. 20 tablet 0  . ondansetron (ZOFRAN ODT) 8 MG disintegrating tablet Take 1 tablet (8 mg total) by mouth every 8 (eight) hours as needed for nausea or vomiting. 30 tablet 2  . oxyCODONE-acetaminophen (PERCOCET) 10-325 MG per tablet Take 1 tablet by mouth every 4 (four) hours as needed for pain. 120 tablet 0  . pantoprazole (PROTONIX) 40 MG tablet Take 1 tablet (40 mg total) by mouth daily. 30 tablet 5  . zolendronic acid (ZOMETA) 4 MG/5ML injection Inject 4 mg into the vein once.    . [DISCONTINUED] aspirin 325 MG EC tablet Take 325 mg by mouth daily.    . [DISCONTINUED] lenalidomide (REVLIMID) 10 MG capsule Take 10 mg by mouth daily.    . [DISCONTINUED] NUCYNTA 75 MG TABS Take 75 mg by mouth every 4 (four) hours as needed. Pain.  0  . [DISCONTINUED] Oyster Shell (OYSTER CALCIUM) 500 MG TABS tablet Take 500 mg of elemental calcium by mouth daily.    . temazepam (RESTORIL) 22.5 MG capsule Take 1 capsule (22.5 mg total) by mouth at bedtime as needed for sleep. 30 capsule 0    Allergies:  Allergies  Allergen Reactions  . Gadolinium Derivatives Nausea And Vomiting    Pt became very nauseous and got sick.     Family History  Problem Relation Age of Onset  . Diabetic kidney disease Mother   . Hypertension Mother   . Heart attack Mother   .  Kidney failure Mother   . Coronary artery disease Mother   . HIV Father     Social History:  reports that he quit smoking about 11 years ago. His smoking use included Cigarettes. He started smoking about 26 years ago. He has a 15 pack-year smoking history. He has never used smokeless tobacco. He reports that he does not drink alcohol or use illicit drugs.  Review of Systems: negative except as above   Blood pressure 129/79, pulse 68, temperature 98.3 F (36.8 C), temperature source Oral, resp. rate 18, height '5\' 10"'  (1.778 m), weight 86.728 kg (191 lb 3.2 oz), SpO2 100 %. Head: Normocephalic, without obvious abnormality, atraumatic Neck:  no adenopathy, no carotid bruit, no JVD, supple, symmetrical, trachea midline and thyroid not enlarged, symmetric, no tenderness/mass/nodules Resp: clear to auscultation bilaterally Cardio: regular rate and rhythm, S1, S2 normal, no murmur, click, rub or gallop GI: Abdomen soft nondistended with normoactive bowel sounds. No hepatomegaly masses or guarding. Extremities: extremities normal, atraumatic, no cyanosis or edema  Results for orders placed or performed during the hospital encounter of 02/04/15 (from the past 48 hour(s))  CBC     Status: Abnormal   Collection Time: 02/04/15  6:00 PM  Result Value Ref Range   WBC 6.0 4.0 - 10.5 K/uL   RBC 3.79 (L) 4.22 - 5.81 MIL/uL   Hemoglobin 11.2 (L) 13.0 - 17.0 g/dL   HCT 34.6 (L) 39.0 - 52.0 %   MCV 91.3 78.0 - 100.0 fL   MCH 29.6 26.0 - 34.0 pg   MCHC 32.4 30.0 - 36.0 g/dL   RDW 13.7 11.5 - 15.5 %   Platelets 119 (L) 150 - 400 K/uL    Comment: CONSISTENT WITH PREVIOUS RESULT  Creatinine, serum     Status: None   Collection Time: 02/04/15  6:00 PM  Result Value Ref Range   Creatinine, Ser 1.22 0.61 - 1.24 mg/dL   GFR calc non Af Amer >60 >60 mL/min   GFR calc Af Amer >60 >60 mL/min    Comment: (NOTE) The eGFR has been calculated using the CKD EPI equation. This calculation has not been validated in all clinical situations. eGFR's persistently <60 mL/min signify possible Chronic Kidney Disease.   CBC     Status: Abnormal   Collection Time: 02/05/15  5:27 AM  Result Value Ref Range   WBC 4.2 4.0 - 10.5 K/uL   RBC 3.58 (L) 4.22 - 5.81 MIL/uL   Hemoglobin 10.8 (L) 13.0 - 17.0 g/dL   HCT 32.7 (L) 39.0 - 52.0 %   MCV 91.3 78.0 - 100.0 fL   MCH 30.2 26.0 - 34.0 pg   MCHC 33.0 30.0 - 36.0 g/dL   RDW 13.6 11.5 - 15.5 %   Platelets 131 (L) 150 - 400 K/uL  Comprehensive metabolic panel     Status: Abnormal   Collection Time: 02/05/15  5:27 AM  Result Value Ref Range   Sodium 136 135 - 145 mmol/L   Potassium 5.1 3.5 - 5.1 mmol/L    Chloride 104 101 - 111 mmol/L   CO2 25 22 - 32 mmol/L   Glucose, Bld 154 (H) 65 - 99 mg/dL   BUN 16 6 - 20 mg/dL   Creatinine, Ser 1.22 0.61 - 1.24 mg/dL   Calcium 10.4 (H) 8.9 - 10.3 mg/dL   Total Protein 7.4 6.5 - 8.1 g/dL   Albumin 4.3 3.5 - 5.0 g/dL   AST 29 15 - 41 U/L   ALT  31 17 - 63 U/L   Alkaline Phosphatase 59 38 - 126 U/L   Total Bilirubin 1.5 (H) 0.3 - 1.2 mg/dL   GFR calc non Af Amer >60 >60 mL/min   GFR calc Af Amer >60 >60 mL/min    Comment: (NOTE) The eGFR has been calculated using the CKD EPI equation. This calculation has not been validated in all clinical situations. eGFR's persistently <60 mL/min signify possible Chronic Kidney Disease.    Anion gap 7 5 - 15  Differential     Status: Abnormal   Collection Time: 02/05/15  5:27 AM  Result Value Ref Range   Neutrophils Relative % 86 (H) 43 - 77 %   Neutro Abs 3.5 1.7 - 7.7 K/uL   Lymphocytes Relative 12 12 - 46 %   Lymphs Abs 0.5 (L) 0.7 - 4.0 K/uL   Monocytes Relative 2 (L) 3 - 12 %   Monocytes Absolute 0.1 0.1 - 1.0 K/uL   Eosinophils Relative 0 0 - 5 %   Eosinophils Absolute 0.0 0.0 - 0.7 K/uL   Basophils Relative 0 0 - 1 %   Basophils Absolute 0.0 0.0 - 0.1 K/uL  Urinalysis, Routine w reflex microscopic (not at [facility])     Status: Abnormal   Collection Time: 02/05/15  8:35 AM  Result Value Ref Range   Color, Urine YELLOW YELLOW   APPearance CLEAR CLEAR   Specific Gravity, Urine 1.036 (H) 1.005 - 1.030   pH 6.0 5.0 - 8.0   Glucose, UA NEGATIVE NEGATIVE mg/dL   Hgb urine dipstick NEGATIVE NEGATIVE   Bilirubin Urine NEGATIVE NEGATIVE   Ketones, ur NEGATIVE NEGATIVE mg/dL   Protein, ur NEGATIVE NEGATIVE mg/dL   Urobilinogen, UA 1.0 0.0 - 1.0 mg/dL   Nitrite NEGATIVE NEGATIVE   Leukocytes, UA NEGATIVE NEGATIVE    Comment: MICROSCOPIC NOT DONE ON URINES WITH NEGATIVE PROTEIN, BLOOD, LEUKOCYTES, NITRITE, OR GLUCOSE <1000 mg/dL.  CBC with Differential/Platelet     Status: Abnormal   Collection Time:  02/06/15  5:00 AM  Result Value Ref Range   WBC 5.0 4.0 - 10.5 K/uL   RBC 3.31 (L) 4.22 - 5.81 MIL/uL   Hemoglobin 10.2 (L) 13.0 - 17.0 g/dL   HCT 30.3 (L) 39.0 - 52.0 %   MCV 91.5 78.0 - 100.0 fL   MCH 30.8 26.0 - 34.0 pg   MCHC 33.7 30.0 - 36.0 g/dL   RDW 13.7 11.5 - 15.5 %   Platelets 107 (L) 150 - 400 K/uL    Comment: SPECIMEN CHECKED FOR CLOTS REPEATED TO VERIFY PLATELET COUNT CONFIRMED BY SMEAR    Neutrophils Relative % 78 (H) 43 - 77 %   Lymphocytes Relative 14 12 - 46 %   Monocytes Relative 8 3 - 12 %   Eosinophils Relative 0 0 - 5 %   Basophils Relative 0 0 - 1 %   Neutro Abs 3.9 1.7 - 7.7 K/uL   Lymphs Abs 0.7 0.7 - 4.0 K/uL   Monocytes Absolute 0.4 0.1 - 1.0 K/uL   Eosinophils Absolute 0.0 0.0 - 0.7 K/uL   Basophils Absolute 0.0 0.0 - 0.1 K/uL  Protime-INR     Status: None   Collection Time: 02/06/15  5:00 AM  Result Value Ref Range   Prothrombin Time 13.5 11.6 - 15.2 seconds   INR 1.01 0.00 - 1.49  Comprehensive metabolic panel     Status: Abnormal   Collection Time: 02/06/15  8:05 AM  Result Value Ref Range  Sodium 137 135 - 145 mmol/L   Potassium 3.6 3.5 - 5.1 mmol/L    Comment: DELTA CHECK NOTED REPEATED TO VERIFY    Chloride 104 101 - 111 mmol/L   CO2 27 22 - 32 mmol/L   Glucose, Bld 94 65 - 99 mg/dL   BUN 16 6 - 20 mg/dL   Creatinine, Ser 1.01 0.61 - 1.24 mg/dL   Calcium 9.2 8.9 - 10.3 mg/dL   Total Protein 6.4 (L) 6.5 - 8.1 g/dL   Albumin 3.8 3.5 - 5.0 g/dL   AST 19 15 - 41 U/L   ALT 24 17 - 63 U/L   Alkaline Phosphatase 53 38 - 126 U/L   Total Bilirubin 2.2 (H) 0.3 - 1.2 mg/dL   GFR calc non Af Amer >60 >60 mL/min   GFR calc Af Amer >60 >60 mL/min    Comment: (NOTE) The eGFR has been calculated using the CKD EPI equation. This calculation has not been validated in all clinical situations. eGFR's persistently <60 mL/min signify possible Chronic Kidney Disease.    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 Evaluate for intracranial etiology for 2 weeks of nausea and vomiting.  EXAM: CT HEAD WITHOUT AND WITH CONTRAST  TECHNIQUE: Contiguous axial images were obtained from the base of the skull through the vertex without and with intravenous contrast  CONTRAST:  166m OMNIPAQUE IOHEXOL 300 MG/ML  SOLN  COMPARISON:  None.  FINDINGS: No evidence for acute infarction, hemorrhage, mass lesion, hydrocephalus, or extra-axial fluid. There is no atrophy or white matter disease.  Post infusion, no abnormal enhancement is seen. No large vessel occlusion. Major dural venous sinuses are patent. No clear osseous abnormality is evident.  IMPRESSION: Negative exam.  No acute or focal intracranial abnormalities are detected.   Electronically Signed   By: JStaci RighterM.D.   On: 02/04/2015 21:27   Acute Abdominal Series  02/04/2015   CLINICAL DATA:  Upper left chest pain for 1 day  EXAM: DG ABDOMEN ACUTE W/ 1V CHEST  COMPARISON:  11/02/2014 chest radiograph  FINDINGS: There is no evidence of dilated bowel loops or free intraperitoneal air. No radiopaque calculi or other significant radiographic abnormality is seen. Heart size and mediastinal contours are within normal limits. Both lungs are clear. Right-sided Port-A-Cath in place with tip over the right atrium. Retained contrast noted within nondilated renal collecting systems right greater than left hip degenerative change noted. Previously seen lytic lesion involving the right iliac bone may be visual or could represent overlying bowel gas allowing for radiographic technique. The patient has had posterior decompression at L5.  IMPRESSION: Negative abdominal radiographs.  No acute cardiopulmonary disease.   Electronically Signed   By: GConchita ParisM.D.   On: 02/04/2015 21:33   Dg Bone Survey Met  02/05/2015   CLINICAL DATA:  Multiple myeloma.  Bone pain.  EXAM: METASTATIC BONE SURVEY  COMPARISON:  None.  FINDINGS: Mixed lytic and  sclerotic lesions are seen involving the left lateral second rib, the right anterior eleventh rib, and the L5 vertebral body.  Old healed fracture deformity of the left anterior fifth rib noted. An old T12 vertebral body compression fracture is seen with previous vertebroplasty.  Left-sided cervical facet DJD noted. Left acromioclavicular degenerative changes also seen. Prior L5 laminectomy noted.  IMPRESSION: Mixed lytic and sclerotic bone lesions involving the left lateral second rib,  right anterior eleventh rib, and L5 vertebral body.  Old T12 vertebral body compression fracture with previous vertebroplasty, and old left anterior rib fracture deformity also noted.   Electronically Signed   By: Earle Gell M.D.   On: 02/05/2015 11:56    Assessment: Three-week history of intermittent nausea and vomiting without clear cause, no evidence of small bowel obstruction on KUB, liver function tests normal. Plan:  Will proceed with EGD and will obtain biopsies if no other lesions found to rule out any evidence of infiltrative disease of the duodenum or stomach. Enyah Moman C 02/06/2015, 10:07 AM  Pager [phone] If no answer or after 5 PM call [phone]

## 2015-02-06 NOTE — Progress Notes (Signed)
Mark [name] is feeling a little bit better. He had another episode of emesis this morning. I think we will have to get GI involved. I think he probably needs to have an upper endoscopy.  By the labs that we did in our office, his myeloma clearly has recurred. His serum Kappa Light chain is now up to 40 mg/dL. I need to make sure that there is no infiltration of the myeloma into his organs. I think this is where GI can really help [place] out  He goes for his bone marrow test today.  His bone survey really do not help me out that much. It is hard to tell if he has new bone lesions.  There is not been any problems with fever. He has had no cough or shortness of breath .  His pain seems to be doing better.he is on a Duragesic patch. He has been on this for a while. I don't know if this might be causing some of the issues with emesis. We can always take him off this and put him on some different for long-term pain control.  I think that as far as treatment goes, I probably I will consider him for Kyprolis/Pomalidomide/Decadron. I think this would be worthwhile and ultimately he will need an allogeneic transplant.  [name], his vital signs are all stable area and temperature 98.3. Pulse 60. Blood pressure 129/79. Head and neck exam shows no ocular or oral lesions. He has no palpable cervical or supraclavicular lymph nodes. Lungs are clear. Cardiac exam regular rate and rhythm with no murmurs, rubs or bruits. Abdomen is soft. Bowel sounds present. There is no guarding or rebound tenderness. Extremities shows good strength in his legs. He has good range of motion of his joints.   We will see about the bone marrow test today.  I will call GI. Maybe they can do an upper endoscopy on him today. He is nothing by mouth for the bone marrow test so I will keep him nothing by mouth for now.  I will give him a dose of Decadron today.  Hopefully, if all goes well today, them we could discharge him over  the weekend. He has a very important event at church that he must go to on Sunday.   I very much appreciate the outstanding care that he is getting from all staff on 3 W!!!  Mark [name].

## 2015-02-06 NOTE — Procedures (Signed)
Technically successful CT guided bone marrow aspiration and biopsy of left iliac crest. No immediate complications.   

## 2015-02-06 NOTE — Op Note (Signed)
[facility] [place], [address]   ENDOSCOPY PROCEDURE REPORT  PATIENT: Mark [name], Mark [name]  MR#: [id number] BIRTHDATE: [birth year] , 43  yrs. old GENDER: male ENDOSCOPIST: Teena Irani, MD REFERRED BY: PROCEDURE DATE:  2015/02/26 PROCEDURE: ASA CLASS: INDICATIONS:  nausea and vomiting in a patient with multiple myeloma MEDICATIONS: fentanyl 50 g Versed 3 mg TOPICAL ANESTHETIC: Cetacaine spray  DESCRIPTION OF PROCEDURE: After the risks benefits and alternatives of the procedure were thoroughly explained, informed consent was obtained.  The Pentax Gastroscope [id number] endoscope was introduced through the mouth and advanced to the second portion of the duodenum , Without limitations.  The instrument was slowly withdrawn as the mucosa was fully examined. Estimated blood loss is zero unless otherwise noted in this procedure report.    esophagus: Normal Stomach: Very mild patchy gastritis, biopsied       retroflex view the cardia was normal Duodenum: Normal          The scope was then withdrawn from the patient and the procedure completed.  COMPLICATIONS: There were no immediate complications.  ENDOSCOPIC IMPRESSION: minimal antral gastritis, no direct suggestion  of gastric malignancy outlet obstruction or peptic ulcer disease  RECOMMENDATIONS: resume previous diet, await biopsies, continue anti-medics when necessary.  REPEAT EXAM:  eSigned:  Teena Irani, MD 02-26-2015 2:43 PM    CC:  CPT CODES: ICD CODES:  The ICD and CPT codes recommended by this software are interpretations from the data that the clinical staff has captured with the software.  The verification of the translation of this report to the ICD and CPT codes and modifiers is the sole responsibility of the health care institution and practicing physician where this report was generated.  [employer]. will not be held responsible for the validity of the ICD  and CPT codes included on this report.  AMA assumes no liability for data contained or not contained herein. CPT is a Designer, television/film set of the [employer].

## 2015-02-07 DIAGNOSIS — R112 Nausea with vomiting, unspecified: Secondary | ICD-10-CM

## 2015-02-07 MED ORDER — PALONOSETRON HCL INJECTION 0.25 MG/5ML
0.2500 mg | Freq: Once | INTRAVENOUS | Status: AC
  Administered 2015-02-07: 0.25 mg via INTRAVENOUS
  Filled 2015-02-07: qty 5

## 2015-02-07 NOTE — Progress Notes (Signed)
SUBJECTIVE: Patient had bone marrow biopsy and EGD yesterday. He continues to have moderate degree of nausea but he did not have any emesis.  OBJECTIVE PHYSICAL EXAMINATION: ECOG PERFORMANCE STATUS: 1 - Symptomatic but completely ambulatory  Filed Vitals:   02/07/15 0518  BP: 128/68  Pulse: 73  Temp: 98.3 F (36.8 C)  Resp: 12   Filed Weights   02/05/15 0415 02/06/15 0613 02/07/15 0518  Weight: 198 lb 6.4 oz (89.994 kg) 191 lb 3.2 oz (86.728 kg) 191 lb 8 oz (86.864 kg)    GENERAL:alert, no distress and comfortable SKIN: skin color, texture, turgor are normal, no rashes or significant lesions LYMPH:  no palpable lymphadenopathy in the cervical, axillary or inguinal LUNGS: clear to auscultation and percussion with normal breathing effort HEART: regular rate & rhythm and no murmurs and no lower extremity edema ABDOMEN: Left lower quadrant pain Musculoskeletal:no cyanosis of digits and no clubbing  NEURO: alert & oriented x 3 with fluent speech, no focal motor/sensory deficits  LABORATORY DATA:  I have reviewed the data as listed CMP Latest Ref Rng 02/06/2015 02/05/2015 02/04/2015  Glucose 65 - 99 mg/dL 94 154(H) 97  BUN 6 - 20 mg/dL '16 16 10  ' Creatinine 0.61 - 1.24 mg/dL 1.01 1.22 1.22  Sodium 135 - 145 mmol/L 137 136 139  Potassium 3.5 - 5.1 mmol/L 3.6 5.1 4.3  Chloride 101 - 111 mmol/L 104 104 102  CO2 22 - 32 mmol/L '27 25 30  ' Calcium 8.9 - 10.3 mg/dL 9.2 10.4(H) 11.1(H)  Total Protein 6.5 - 8.1 g/dL 6.4(L) 7.4 8.0  Albumin 3.3 - 5.5 g/dL - - 4.2  Total Bilirubin 0.3 - 1.2 mg/dL 2.2(H) 1.5(H) 1.90(H)  Alkaline Phos 38 - 126 U/L 53 59 65  AST 15 - 41 U/L '19 29 31  ' ALT 17 - 63 U/L 24 31 40      Lab Results  Component Value Date   WBC 5.0 02/06/2015   HGB 10.2* 02/06/2015   HCT 30.3* 02/06/2015   MCV 91.5 02/06/2015   PLT 107* 02/06/2015   NEUTROABS 3.9 02/06/2015    ASSESSMENT AND PLAN: 1. Intractable nausea and vomiting: Patient is very concerned if he goes home  today that he would have profound nausea and vomiting there. This is apparently what happened in the previous time. Since he had EGD and bone marrow biopsy yesterday we will observe him for another 24 hours.  2. I will give him a dose of Aloxi 3. We will change his diet to regular diet. If he tolerates it well we can discharge him tomorrow morning. 4. Anemia and thrombocytopenia  4.

## 2015-02-07 NOTE — Progress Notes (Signed)
Patient ID: Mark [name], male   DOB: 06/07/1971, 43 y.o.   MRN: [id number] [facility] Progress Note  Mark [name] 43 y.o. 04/03/1971   Subjective: Feels ok. Felt a little nauseous with regular diet but denies vomiting or abdominal pain.  Objective: Vital signs in last 24 hours: Filed Vitals:   02/07/15 0518  BP: 128/68  Pulse: 73  Temp: 98.3 F (36.8 C)  Resp: 12    Physical Exam: Gen: alert, no acute distress CV: RRR Chest: CTA B Abd: soft, nontender,  nondistended, +BS Ext: no edema  Lab Results:  Recent Labs  02/05/15 0527 02/06/15 0805  NA 136 137  K 5.1 3.6  CL 104 104  CO2 25 27  GLUCOSE 154* 94  BUN 16 16  CREATININE 1.22 1.01  CALCIUM 10.4* 9.2    Recent Labs  02/05/15 0527 02/06/15 0805  AST 29 19  ALT 31 24  ALKPHOS 59 53  BILITOT 1.5* 2.2*  PROT 7.4 6.4*  ALBUMIN 4.3 3.8    Recent Labs  02/05/15 0527 02/06/15 0500  WBC 4.2 5.0  NEUTROABS 3.5 3.9  HGB 10.8* 10.2*  HCT 32.7* 30.3*  MCV 91.3 91.5  PLT 131* 107*    Recent Labs  02/06/15 0500  LABPROT 13.5  INR 1.01      Assessment/Plan: Nausea/Vomiting in the setting of multiple myeloma. No gastric outlet obstruction or ulcer on EGD. Supportive care. If tolerates regular diet today, then agree with d/c tomorrow. Will sign off. F/U with GI prn. Call if questions.   SCHOOLER,VINCENT C. 02/07/2015, 12:40 PM  Pager [phone]  If no answer or after 5 PM call [phone]  

## 2015-02-08 DIAGNOSIS — C9000 Multiple myeloma not having achieved remission: Principal | ICD-10-CM

## 2015-02-08 DIAGNOSIS — D696 Thrombocytopenia, unspecified: Secondary | ICD-10-CM

## 2015-02-08 DIAGNOSIS — D649 Anemia, unspecified: Secondary | ICD-10-CM

## 2015-02-08 MED ORDER — HEPARIN SOD (PORK) LOCK FLUSH 100 UNIT/ML IV SOLN
500.0000 [IU] | INTRAVENOUS | Status: AC | PRN
  Administered 2015-02-08: 500 [IU]

## 2015-02-08 MED ORDER — ONDANSETRON 8 MG PO TBDP: 8.0000 mg | ORAL_TABLET | Freq: Three times a day (TID) | ORAL | Status: DC | PRN

## 2015-02-08 NOTE — Discharge Summary (Signed)
Physician Discharge Summary  Patient ID: Mark [name] MRN: [id number] DOB/AGE: 02/27/1971 44 y.o.  Admit date: 25-Feb-2015 Discharge date: 02/08/2015  Primary Care Physician:  No PCP Per Patient   Discharge Diagnoses:   1. Intractable nausea and vomiting 2. Multiple myeloma light chain   Present on Admission:  . Vomiting  Discharge Medications:    Medication List    STOP taking these medications        ZOMETA 4 MG/5ML injection  Generic drug:  zolendronic acid      TAKE these medications        acetaminophen 500 MG tablet  Commonly known as:  TYLENOL  Take 1,000 mg by mouth every 6 (six) hours as needed for moderate pain or headache (headache).     bisacodyl 5 MG EC tablet  Commonly known as:  DULCOLAX  Take 5 mg by mouth daily as needed for moderate constipation (constipation).     fentaNYL 75 MCG/HR  Commonly known as:  DURAGESIC - dosed mcg/hr  Place 1 patch (75 mcg total) onto the skin every 3 (three) days.     gabapentin 300 MG capsule  Commonly known as:  NEURONTIN  Take 2 capsules (600 mg total) by mouth 3 (three) times daily.     metoCLOPramide 10 MG tablet  Commonly known as:  REGLAN  Take 1 tablet (10 mg total) by mouth every 6 (six) hours as needed for nausea or vomiting.     ondansetron 8 MG disintegrating tablet  Commonly known as:  ZOFRAN ODT  Take 1 tablet (8 mg total) by mouth every 8 (eight) hours as needed for nausea or vomiting.     ondansetron 8 MG disintegrating tablet  Commonly known as:  ZOFRAN-ODT  Take 1 tablet (8 mg total) by mouth every 8 (eight) hours as needed for nausea or vomiting.     oxyCODONE-acetaminophen 10-325 MG per tablet  Commonly known as:  PERCOCET  Take 1 tablet by mouth every 4 (four) hours as needed for pain.     pantoprazole 40 MG tablet  Commonly known as:  PROTONIX  Take 1 tablet (40 mg total) by mouth daily.     temazepam 22.5 MG capsule  Commonly known as:  RESTORIL  Take 1 capsule (22.5 mg  total) by mouth at bedtime as needed for sleep.         Disposition and Follow-up:   Significant Diagnostic Studies:  Ct Head W Wo Contrast  2015/02/25   CLINICAL DATA:  Recurrent Kappa light chain myeloma is suspected. Evaluate for intracranial etiology for 2 weeks of nausea and vomiting.  EXAM: CT HEAD WITHOUT AND WITH CONTRAST  TECHNIQUE: Contiguous axial images were obtained from the base of the skull through the vertex without and with intravenous contrast  CONTRAST:  115m OMNIPAQUE IOHEXOL 300 MG/ML  SOLN  COMPARISON:  None.  FINDINGS: No evidence for acute infarction, hemorrhage, mass lesion, hydrocephalus, or extra-axial fluid. There is no atrophy or white matter disease.  Post infusion, no abnormal enhancement is seen. No large vessel occlusion. Major dural venous sinuses are patent. No clear osseous abnormality is evident.  IMPRESSION: Negative exam.  No acute or focal intracranial abnormalities are detected.   Electronically Signed   By: JStaci RighterM.D.   On: 007/27/1621:27   Ct Biopsy  02/06/2015   INDICATION: History of multiple myeloma, now with concern for recurrent. Please perform CT-guided biopsy for tissue diagnostic purposes.  EXAM: CT-GUIDED BONE MARROW BIOPSY AND ASPIRATION  MEDICATIONS: Fentanyl 50 mcg IV; Versed 1.5 mg IV  ANESTHESIA/SEDATION: Sedation Time  7 minutes  CONTRAST:  None  COMPLICATIONS: None immediate.  PROCEDURE: Informed consent was obtained from the patient following an explanation of the procedure, risks, benefits and alternatives. The patient understands, agrees and consents for the procedure. All questions were addressed. A time out was performed prior to the initiation of the procedure. The patient was positioned prone and non-contrast localization CT was performed of the pelvis to demonstrate the iliac marrow spaces. The operative site was prepped and draped in the usual sterile fashion.  Under sterile conditions and local anesthesia, a 22 gauge spinal  needle was utilized for procedural planning. Next, an 11 gauge coaxial bone biopsy needle was advanced into the left iliac marrow space. Needle position was confirmed with CT imaging. Initially, bone marrow aspiration was performed. Next, a bone marrow biopsy was obtained with the 11 gauge outer bone marrow device. The 11 gauge coaxial bone biopsy needle was re-advanced into a slightly different location within the left iliac marrow space, positioning was confirmed and an additional bone marrow biopsy was obtained. Samples were prepared with the cytotechnologist and deemed adequate. The needle was removed intact. Hemostasis was obtained with compression and a dressing was placed. The patient tolerated the procedure well without immediate post procedural complication.  IMPRESSION: Successful CT guided left iliac bone marrow aspiration and core biopsies.   Electronically Signed   By: Sandi Mariscal M.D.   On: 02/06/2015 13:20   Acute Abdominal Series  02/04/2015   CLINICAL DATA:  Upper left chest pain for 1 day  EXAM: DG ABDOMEN ACUTE W/ 1V CHEST  COMPARISON:  11/02/2014 chest radiograph  FINDINGS: There is no evidence of dilated bowel loops or free intraperitoneal air. No radiopaque calculi or other significant radiographic abnormality is seen. Heart size and mediastinal contours are within normal limits. Both lungs are clear. Right-sided Port-A-Cath in place with tip over the right atrium. Retained contrast noted within nondilated renal collecting systems right greater than left hip degenerative change noted. Previously seen lytic lesion involving the right iliac bone may be visual or could represent overlying bowel gas allowing for radiographic technique. The patient has had posterior decompression at L5.  IMPRESSION: Negative abdominal radiographs.  No acute cardiopulmonary disease.   Electronically Signed   By: Conchita Paris M.D.   On: 02/04/2015 21:33   Dg Bone Survey Met  02/05/2015   CLINICAL DATA:   Multiple myeloma.  Bone pain.  EXAM: METASTATIC BONE SURVEY  COMPARISON:  None.  FINDINGS: Mixed lytic and sclerotic lesions are seen involving the left lateral second rib, the right anterior eleventh rib, and the L5 vertebral body.  Old healed fracture deformity of the left anterior fifth rib noted. An old T12 vertebral body compression fracture is seen with previous vertebroplasty.  Left-sided cervical facet DJD noted. Left acromioclavicular degenerative changes also seen. Prior L5 laminectomy noted.  IMPRESSION: Mixed lytic and sclerotic bone lesions involving the left lateral second rib, right anterior eleventh rib, and L5 vertebral body.  Old T12 vertebral body compression fracture with previous vertebroplasty, and old left anterior rib fracture deformity also noted.   Electronically Signed   By: Earle Gell M.D.   On: 02/05/2015 11:56    Discharge Laboratory Values: CMP Latest Ref Rng 02/06/2015 02/05/2015 02/04/2015  Glucose 65 - 99 mg/dL 94 154(H) 97  BUN 6 - 20 mg/dL _0 Creatinine 0.61 - 1.24 mg/dL 1.01 1.22 1.22  Sodium 135 - 145 mmol/L 137 136 139  Potassium 3.5 - 5.1 mmol/L 3.6 5.1 4.3  Chloride 101 - 111 mmol/L 104 104 102  CO2 22 - 32 mmol/L _0 Calcium 8.9 - 10.3 mg/dL 9.2 10.4(H) 11.1(H)  Total Protein 6.5 - 8.1 g/dL 6.4(L) 7.4 8.0  Albumin 3.3 - 5.5 g/dL - - 4.2  Total Bilirubin 0.3 - 1.2 mg/dL 2.2(H) 1.5(H) 1.90(H)  Alkaline Phos 38 - 126 U/L 53 59 65  AST 15 - 41 U/L _1 ALT 17 - 63 U/L 24 31 40    CBC    Component Value Date/Time   WBC 5.0 02/06/2015 0500   WBC 2.7* 02/04/2015 1124   WBC 5.7 02/21/2013 0907   RBC 3.31* 02/06/2015 0500   RBC 4.02* 02/04/2015 1124   RBC 3.58* 02/21/2013 0907   HGB 10.2* 02/06/2015 0500   HGB 12.7* 02/04/2015 1124   HGB 11.3* 02/21/2013 0907   HCT 30.3* 02/06/2015 0500   HCT 37.1* 02/04/2015 1124   HCT 33.2* 02/21/2013 0907   PLT 107* 02/06/2015 0500   PLT 138* 02/04/2015 1124   PLT 169 02/21/2013 0907   MCV 91.5  02/06/2015 0500   MCV 92 02/04/2015 1124   MCV 92.7 02/21/2013 0907   MCH 30.8 02/06/2015 0500   MCH 31.6 02/04/2015 1124   MCH 31.6 02/21/2013 0907   MCHC 33.7 02/06/2015 0500   MCHC 34.2 02/04/2015 1124   MCHC 34.0 02/21/2013 0907   RDW 13.7 02/06/2015 0500   RDW 13.0 02/04/2015 1124   RDW 12.6 02/21/2013 0907   LYMPHSABS 0.7 02/06/2015 0500   LYMPHSABS 0.8* 02/04/2015 1124   LYMPHSABS 0.5* 02/21/2013 0907   MONOABS 0.4 02/06/2015 0500   MONOABS 0.4 02/21/2013 0907   EOSABS 0.0 02/06/2015 0500   EOSABS 0.1 02/04/2015 1124   EOSABS 0.1 02/21/2013 0907   BASOSABS 0.0 02/06/2015 0500   BASOSABS 0.0 02/04/2015 1124   BASOSABS 0.0 02/21/2013 0907      Brief H and P: For complete details please refer to admission H and P, but in brief, Mark [name] was admitted to the hospital with complaints of intractable nausea and vomiting. He was also having increased abdominal pain. 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 He was having increasing pain in the sternum as well as the hips. He was seen in the office and received IV fluids but when his nausea could not be controlled with outpatient management, he was admitted to the hospital. Because he had hypercalcemia, he received IV fluids followed by Zometa. Patient underwent upper endoscopy and biopsy to evaluate if there is any infiltrative disease of the stomach. The results are not available at the time of discharge. He also underwent a bone marrow biopsy to evaluate if he has any recurrent multiple myeloma. These results were also not available at the time of discharge.  Physical Exam at Discharge: BP 145/77 mmHg  Pulse 77  Temp(Src) 98.4 F (36.9 C) (Oral)  Resp 16  Ht _2  (1.778 m)  Wt 192 lb 1.6 oz (87.136 kg)  BMI 27.56 kg/m2  SpO2 100% Gen: Awake alert oriented Cardiovascular: S1-S2 normal Respiratory: Clear to auscultation bilaterally Gastrointestinal: Lower abdominal discomfort Extremities: No  edema  Hospital Course: While he has been in the hospital he received IV fluids along with round-the-clock antinausea medications. He also received a dose of Aloxi. His nausea got a good control. His diet was advanced to regular  diet. He was finally discharged home with his home medications. He needed refill of Zofran.  Follow-up: With Dr. Marin Olp later this week to go over the results of biopsies.  Active Problems:   Vomiting   Diet:  Regular  Activity:  As tolerated  Condition at Discharge:   Stable  Signed Rulon Eisenmenger, MD  02/08/2015, 7:55 AM

## 2015-02-08 NOTE — Progress Notes (Signed)
Patient discharged home, all discharge medications and instructions reviewed and questions answered. Patient to be assisted to vehicle by wheelchair when ride arrives. 

## 2015-02-08 NOTE — Progress Notes (Signed)
SUBJECTIVE: Did not have any vomiting yesterday. Diet was advanced to regular and he did have mild nausea but he did not throw up.  OBJECTIVE PHYSICAL EXAMINATION: ECOG PERFORMANCE STATUS: 1 - Symptomatic but completely ambulatory  Filed Vitals:   02/08/15 0439  BP: 145/77  Pulse: 77  Temp: 98.4 F (36.9 C)  Resp: 16   Filed Weights   02/06/15 0613 02/07/15 0518 02/08/15 0439  Weight: 191 lb 3.2 oz (86.728 kg) 191 lb 8 oz (86.864 kg) 192 lb 1.6 oz (87.136 kg)    GENERAL:alert, no distress and comfortable SKIN: skin color, texture, turgor are normal, no rashes or significant lesions EYES: normal, Conjunctiva are pink and non-injected, sclera clear OROPHARYNX:no exudate, no erythema and lips, buccal mucosa, and tongue normal  LUNGS: clear to auscultation and percussion with normal breathing effort HEART: regular rate & rhythm and no murmurs and no lower extremity edema ABDOMEN: mild discomfort Musculoskeletal:no cyanosis of digits and no clubbing  NEURO: alert & oriented x 3 with fluent speech, no focal motor/sensory deficits  LABORATORY DATA:  I have reviewed the data as listed CMP Latest Ref Rng 02/06/2015 02/05/2015 02/04/2015  Glucose 65 - 99 mg/dL 94 154(H) 97  BUN 6 - 20 mg/dL 16 16 10  Creatinine 0.61 - 1.24 mg/dL 1.01 1.22 1.22  Sodium 135 - 145 mmol/L 137 136 139  Potassium 3.5 - 5.1 mmol/L 3.6 5.1 4.3  Chloride 101 - 111 mmol/L 104 104 102  CO2 22 - 32 mmol/L 27 25 30  Calcium 8.9 - 10.3 mg/dL 9.2 10.4(H) 11.1(H)  Total Protein 6.5 - 8.1 g/dL 6.4(L) 7.4 8.0  Albumin 3.3 - 5.5 g/dL - - 4.2  Total Bilirubin 0.3 - 1.2 mg/dL 2.2(H) 1.5(H) 1.90(H)  Alkaline Phos 38 - 126 U/L 53 59 65  AST 15 - 41 U/L 19 29 31  ALT 17 - 63 U/L 24 31 40      Lab Results  Component Value Date   WBC 5.0 02/06/2015   HGB 10.2* 02/06/2015   HCT 30.3* 02/06/2015   MCV 91.5 02/06/2015   PLT 107* 02/06/2015   NEUTROABS 3.9 02/06/2015    ASSESSMENT AND PLAN: 1. Intractable nausea and  vomiting: Marked improvement in his symptoms. We do not yet have the results of EGD. We will discharge him home today. He will follow-up with Dr. Ennever in a few days to discuss the results of the biopsies that he had.  2. Anemia and thrombocytopenia 3. multiple myeloma: Status post bone marrow biopsy Discharge home. 

## 2015-02-09 ENCOUNTER — Emergency Department (HOSPITAL_COMMUNITY): Payer: BLUE CROSS/BLUE SHIELD

## 2015-02-09 ENCOUNTER — Telehealth: Payer: Self-pay | Admitting: *Deleted

## 2015-02-09 ENCOUNTER — Other Ambulatory Visit: Payer: Self-pay | Admitting: Hematology & Oncology

## 2015-02-09 ENCOUNTER — Emergency Department (HOSPITAL_COMMUNITY)
Admission: EM | Admit: 2015-02-09 | Discharge: 2015-02-10 | Disposition: A | Discharge status: Home / Self Care | Payer: BLUE CROSS/BLUE SHIELD | Attending: Emergency Medicine | Admitting: Emergency Medicine

## 2015-02-09 ENCOUNTER — Encounter (HOSPITAL_COMMUNITY): Payer: Self-pay | Admitting: Gastroenterology

## 2015-02-09 DIAGNOSIS — Z79899 Other long term (current) drug therapy: Secondary | ICD-10-CM | POA: Diagnosis not present

## 2015-02-09 DIAGNOSIS — Z87891 Personal history of nicotine dependence: Secondary | ICD-10-CM | POA: Diagnosis not present

## 2015-02-09 DIAGNOSIS — C9000 Multiple myeloma not having achieved remission: Secondary | ICD-10-CM | POA: Diagnosis not present

## 2015-02-09 DIAGNOSIS — R079 Chest pain, unspecified: Secondary | ICD-10-CM | POA: Diagnosis present

## 2015-02-09 DIAGNOSIS — J159 Unspecified bacterial pneumonia: Secondary | ICD-10-CM | POA: Insufficient documentation

## 2015-02-09 DIAGNOSIS — B9681 Helicobacter pylori [H. pylori] as the cause of diseases classified elsewhere: Secondary | ICD-10-CM

## 2015-02-09 DIAGNOSIS — K2970 Gastritis, unspecified, without bleeding: Principal | ICD-10-CM

## 2015-02-09 DIAGNOSIS — J189 Pneumonia, unspecified organism: Secondary | ICD-10-CM

## 2015-02-09 LAB — CBC
HEMATOCRIT: 31.5 % — AB (ref 39.0–52.0)
Hemoglobin: 10.5 g/dL — ABNORMAL LOW (ref 13.0–17.0)
MCH: 30.7 pg (ref 26.0–34.0)
MCHC: 33.3 g/dL (ref 30.0–36.0)
MCV: 92.1 fL (ref 78.0–100.0)
PLATELETS: 121 10*3/uL — AB (ref 150–400)
RBC: 3.42 MIL/uL — AB (ref 4.22–5.81)
RDW: 13.5 % (ref 11.5–15.5)
WBC: 3.7 10*3/uL — AB (ref 4.0–10.5)

## 2015-02-09 LAB — UIFE/LIGHT CHAINS/TP QN, 24-HR UR
% BETA, Urine: 2.9 %
ALBUMIN, U: 3.9 %
ALPHA 1 URINE: 0.8 %
ALPHA 2 UR: 2.8 %
Free Kappa/Lambda Ratio: 649.16 — ABNORMAL HIGH (ref 2.04–10.37)
Free Lambda Lt Chains,Ur: 5.33 mg/L (ref 0.24–6.66)
Free Lt Chn Excr Rate: 3460 mg/L — ABNORMAL HIGH (ref 1.35–24.19)
GAMMA GLOBULIN URINE: 89.7 %
M-SPIKE %, Urine: 81.2 % — ABNORMAL HIGH
M-SPIKE, MG/24 HR: 423 mg/(24.h) — AB
TOTAL PROTEIN, URINE-UPE24: 37.2 mg/dL
Time: 24 hours
Total Protein, Urine-Ur/day: 520.8 mg/24 hr — ABNORMAL HIGH (ref 30.0–150.0)
Volume, Urine: 1400 mL

## 2015-02-09 LAB — BASIC METABOLIC PANEL
Anion gap: 5 (ref 5–15)
BUN: 8 mg/dL (ref 6–20)
CO2: 32 mmol/L (ref 22–32)
Calcium: 8.9 mg/dL (ref 8.9–10.3)
Chloride: 102 mmol/L (ref 101–111)
Creatinine, Ser: 1.16 mg/dL (ref 0.61–1.24)
GFR calc Af Amer: >60 mL/min (ref >60)
Glucose, Bld: 106 mg/dL — ABNORMAL HIGH (ref 65–99)
POTASSIUM: 3.5 mmol/L (ref 3.5–5.1)
Sodium: 139 mmol/L (ref 135–145)

## 2015-02-09 LAB — APTT: aPTT: 23 seconds — ABNORMAL LOW (ref 24–37)

## 2015-02-09 LAB — I-STAT TROPONIN, ED: TROPONIN I, POC: 0 ng/mL (ref 0.00–0.08)

## 2015-02-09 LAB — PROTIME-INR
INR: 0.95 (ref 0.00–1.49)
Prothrombin Time: 12.9 seconds (ref 11.6–15.2)

## 2015-02-09 MED ORDER — ASPIRIN 81 MG PO CHEW
324.0000 mg | CHEWABLE_TABLET | Freq: Once | ORAL | Status: AC
  Administered 2015-02-09: 324 mg via ORAL
  Filled 2015-02-09: qty 4

## 2015-02-09 MED ORDER — AMOXICILL-CLARITHRO-LANSOPRAZ PO MISC: Freq: Two times a day (BID) | ORAL | Status: DC

## 2015-02-09 MED ORDER — SODIUM CHLORIDE 0.9 % IV SOLN
1000.0000 mL | INTRAVENOUS | Status: DC
  Administered 2015-02-09: 1000 mL via INTRAVENOUS

## 2015-02-09 MED ORDER — LEVOFLOXACIN 500 MG PO TABS: 500.0000 mg | ORAL_TABLET | Freq: Every day | ORAL | Status: DC

## 2015-02-09 MED ORDER — IOHEXOL 350 MG/ML SOLN
100.0000 mL | Freq: Once | INTRAVENOUS | Status: AC | PRN
  Administered 2015-02-09: 100 mL via INTRAVENOUS

## 2015-02-09 MED ORDER — LEVOFLOXACIN IN D5W 500 MG/100ML IV SOLN
500.0000 mg | Freq: Once | INTRAVENOUS | Status: AC
  Administered 2015-02-09: 500 mg via INTRAVENOUS
  Filled 2015-02-09: qty 100

## 2015-02-09 NOTE — ED Notes (Signed)
Patient does not want to be stuck he wants his port access.  I made nurse aware.

## 2015-02-09 NOTE — ED Provider Notes (Signed)
CSN: [id number]     Arrival date & time 02/09/15  1538 History  First MD Initiated Contact with Patient 02/09/15 1737     Chief Complaint  Patient presents with  . Chest Pain  . Shortness of Breath    HPI Pt was just discharged from the hospital.  He was having intractable nausea and vomiting as well as diffuse pain felt to be related to his history of multiple myeloma. Patient had multiple tests performed. He was discharged from the hospital yesterday. When he woke up this morning he started feeling numbness on the right side of his body.  He started having a headache.  He went to try and take a walk but he started to feel short of breath.  He became concerned and came to the ED.  He still feels a little short of breath and has a headache. Past Medical History  Diagnosis Date  . History of radiation therapy 02/07/13- 02/26/13    lower L spine, upper sacrum, 35 gray in 14 fractions  . FH: chemotherapy     Dr. Burney Gauze  . Cancer   . Multiple myeloma dx'd 01/2013   Past Surgical History  Procedure Laterality Date  . Portacath placement    . 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 Kyphoplasty  05/20/14  . Lumbar laminectomy/decompression microdiscectomy N/A 09/25/2014    Procedure: LUMBAR LAMINECTOMY/DECOMPRESSION MICRODISCECTOMY;  Surgeon: Sinclair Ship, MD;  Location: [facility];  Service: Orthopedics;  Laterality: N/A;  Lumbar 4-5, L5-S1  decompression  . Esophagogastroduodenoscopy N/A 02/06/2015    Procedure: ESOPHAGOGASTRODUODENOSCOPY (EGD);  Surgeon: Teena Irani, MD;  Location: [facility];  Service: Endoscopy;  Laterality: N/A;   Family History  Problem Relation Age of Onset  . Diabetic kidney disease Mother   . Hypertension Mother   . Heart attack Mother   . Kidney failure Mother   . Coronary artery disease Mother   . HIV Father    History  Substance Use Topics  . Smoking status: Former Smoker -- 1.00 packs/day for 15 years    Types: Cigarettes    Start  date: 06/29/1988    Quit date: 06/29/2003  . Smokeless tobacco: Never Used     Comment: quit smoking 10 years ago  . Alcohol Use: No    Review of Systems  All other systems reviewed and are negative.     Allergies  Gadolinium derivatives  Home Medications   Prior to Admission medications   Medication Sig Start Date End Date Taking? Authorizing Provider  acetaminophen (TYLENOL) 500 MG tablet Take 1,000 mg by mouth every 6 (six) hours as needed for moderate pain or headache (headache).     Historical Provider, MD  amoxicillin-clarithromycin-lansoprazole [facility]) combo pack Take by mouth 2 (two) times daily. Follow package directions. 02/09/15   [name], MD  bisacodyl (DULCOLAX) 5 MG EC tablet Take 5 mg by mouth daily as needed for moderate constipation (constipation).     Historical Provider, MD  fentaNYL (DURAGESIC - DOSED MCG/HR) 75 MCG/HR Place 1 patch (75 mcg total) onto the skin every 3 (three) days. 12/09/14   [name], MD  gabapentin (NEURONTIN) 300 MG capsule Take 2 capsules (600 mg total) by mouth 3 (three) times daily. 11/20/14   [name], NP  metoCLOPramide (REGLAN) 10 MG tablet Take 1 tablet (10 mg total) by mouth every 6 (six) hours as needed for nausea or vomiting. 01/27/15   Shanon Rosser, MD  ondansetron (ZOFRAN ODT) 8 MG disintegrating tablet Take 1 tablet (8 mg total) by mouth every 8 (eight) hours as needed for nausea or vomiting. 02/04/15   [name], MD  ondansetron (ZOFRAN-ODT) 8 MG disintegrating tablet Take 1 tablet (8 mg total) by mouth every 8 (eight) hours as needed for nausea or vomiting. 02/08/15   Nicholas Lose, MD  oxyCODONE-acetaminophen (PERCOCET) 10-325 MG per tablet Take 1 tablet by mouth every 4 (four) hours as needed for pain. 08/28/14   [name], MD  pantoprazole (PROTONIX) 40 MG tablet Take 1 tablet (40 mg total) by mouth daily. 02/04/15   [name], MD  temazepam (RESTORIL) 22.5 MG capsule Take 1 capsule (22.5 mg  total) by mouth at bedtime as needed for sleep. 02/04/15   [name], MD   BP 129/78 mmHg  Pulse 84  Temp(Src) 98 F (36.7 C) (Oral)  Resp 19  SpO2 100% Physical Exam  Constitutional: He appears well-developed and well-nourished. No distress.  HENT:  Head: Normocephalic and atraumatic.  Right Ear: External ear normal.  Left Ear: External ear normal.  Eyes: Conjunctivae are normal. Right eye exhibits no discharge. Left eye exhibits no discharge. No scleral icterus.  Neck: Neck supple. No tracheal deviation present.  Cardiovascular: Normal rate, regular rhythm and intact distal pulses.   Pulmonary/Chest: Effort normal and breath sounds normal. No stridor. No respiratory distress. He has no wheezes. He has no rales.  Abdominal: Soft. Bowel sounds are normal. He exhibits no distension. There is no tenderness. There is no rebound and no guarding.  Musculoskeletal: He exhibits no edema or tenderness.  Neurological: He is alert. He has normal strength. No cranial nerve deficit (no facial droop, extraocular movements intact, no slurred speech) or sensory deficit. He exhibits normal muscle tone. He displays no seizure activity. Coordination normal.  Equal grip strength, no facial droop, normal speech, normal sensation  Skin: Skin is warm and dry. No rash noted.  Psychiatric: He has a normal mood and affect.  Nursing note and vitals reviewed.   ED Course  Procedures (including critical care time) Labs Review Labs Reviewed  BASIC METABOLIC PANEL  CBC    Imaging Review Dg Chest 2 View  02/09/2015   CLINICAL DATA:  Chest pain, shortness of breath, headache and right flank pain. History of multiple myeloma. Initial encounter.  EXAM: CHEST  2 VIEW  COMPARISON:  Radiographs 11/02/2014 and 02/04/2015.  FINDINGS: Right IJ Port-A-Cath tip unchanged in the lower SVC. The heart size and mediastinal contours are stable. The lungs are clear. There is no pleural effusion or pneumothorax. Old rib and  lower thoracic fractures are noted. No acute osseous findings evident.  IMPRESSION: Stable chest.  No acute cardiopulmonary process.   Electronically Signed   By: Richardean Sale M.D.   On: 02/09/2015 16:10     EKG Interpretation   Date/Time:  Monday February 09 2015 15:43:01 EDT Ventricular Rate:  87 PR Interval:  161 QRS Duration: 94 QT Interval:  354 QTC Calculation: 426 R Axis:   52 Text Interpretation:  Sinus rhythm Consider left atrial enlargement  nonspecific st t changes compared to prior ECG Confirmed by Stanislav Gervase  MD-J,  Ashlen Kiger ([id number]) on 02/09/2015 6:41:53 PM      MDM  Patient's discharge summary was reviewed. Patient did have a CT scan of the brain a few days ago while he was in the hospital. No acute findings were noted. Final diagnoses:  HCAP (healthcare-associated pneumonia)  Multiple myeloma  Pt does not have a PE.  Possible pna noted on CT scan.  Pt has been in the ED for 7 hours.  Non toxic.  Breathing easily.  Do not feel that he needs to be readmitted.    Will give dose of levaquin to cover for possible HCAP organisms.  Dc home with rx.  Follow up with his PCP   [name], MD 02/09/15 [phone]

## 2015-02-09 NOTE — ED Notes (Signed)
Pt in CT.

## 2015-02-09 NOTE — Telephone Encounter (Signed)
Dr. Marin Olp stated to call [name] and let him know that Mark has heliobacter in his stomach and Mark will send Rx for treatment to his pharmacy.  [name] still experiencing nausea and vomiting.  Wife relieved to hear this.

## 2015-02-09 NOTE — ED Notes (Signed)
Pt presents with c/o chest pain and shortness of breath that started today. Pt reports these symptoms are new for him, denies any hx of asthma. Pt is able to answer questions in complete sentences, appears in NAD.

## 2015-02-10 MED ORDER — HEPARIN SOD (PORK) LOCK FLUSH 100 UNIT/ML IV SOLN
500.0000 [IU] | Freq: Once | INTRAVENOUS | Status: AC
  Administered 2015-02-10: 500 [IU]
  Filled 2015-02-10: qty 5

## 2015-02-11 ENCOUNTER — Ambulatory Visit (HOSPITAL_BASED_OUTPATIENT_CLINIC_OR_DEPARTMENT_OTHER): Payer: BLUE CROSS/BLUE SHIELD | Admitting: Hematology & Oncology

## 2015-02-11 ENCOUNTER — Encounter: Payer: Self-pay | Admitting: Hematology & Oncology

## 2015-02-11 VITALS — BP 115/71 | HR 91 | Temp 98.4°F | Resp 18 | Ht 70.0 in | Wt 197.0 lb

## 2015-02-11 DIAGNOSIS — C9000 Multiple myeloma not having achieved remission: Secondary | ICD-10-CM

## 2015-02-11 DIAGNOSIS — J189 Pneumonia, unspecified organism: Secondary | ICD-10-CM | POA: Diagnosis not present

## 2015-02-11 DIAGNOSIS — J181 Lobar pneumonia, unspecified organism: Principal | ICD-10-CM

## 2015-02-11 MED ORDER — ALBUTEROL SULFATE (5 MG/ML) 0.5% IN NEBU
2.5000 mg | INHALATION_SOLUTION | Freq: Once | RESPIRATORY_TRACT | Status: AC
  Administered 2015-02-11: 2.5 mg via RESPIRATORY_TRACT
  Filled 2015-02-11: qty 0.5

## 2015-02-11 MED ORDER — IPRATROPIUM BROMIDE 0.02 % IN SOLN
0.5000 mg | Freq: Once | RESPIRATORY_TRACT | Status: AC
  Administered 2015-02-11: 0.5 mg via RESPIRATORY_TRACT

## 2015-02-11 MED ORDER — MONTELUKAST SODIUM 10 MG PO TABS: 10.0000 mg | ORAL_TABLET | Freq: Every day | ORAL | Status: DC

## 2015-02-11 MED ORDER — IPRATROPIUM BROMIDE 0.02 % IN SOLN
RESPIRATORY_TRACT | Status: AC
  Filled 2015-02-11: qty 2.5

## 2015-02-11 MED ORDER — IPRATROPIUM-ALBUTEROL 20-100 MCG/ACT IN AERS: 1.0000 | INHALATION_SPRAY | Freq: Four times a day (QID) | RESPIRATORY_TRACT | Status: DC

## 2015-02-11 MED ORDER — FAMCICLOVIR 500 MG PO TABS: 500.0000 mg | ORAL_TABLET | Freq: Every day | ORAL | Status: DC

## 2015-02-11 MED ORDER — IPRATROPIUM-ALBUTEROL 0.5-2.5 (3) MG/3ML IN SOLN: 3.0000 mL | Freq: Once | RESPIRATORY_TRACT | Status: DC

## 2015-02-11 MED ORDER — DEXAMETHASONE 4 MG PO TABS: ORAL_TABLET | ORAL | Status: DC

## 2015-02-11 NOTE — Progress Notes (Signed)
Hematology and Oncology Follow Up Visit  Mark [name] [id number] 1970/09/24 44 y.o. 02/11/2015   Principle Diagnosis:   Relapsed Kappa light chain myeloma  Current Therapy:    Patient start salvage therapy with Darzalex/Velcade/Decadron  Zometa 4 mg IV every month     Interim History:  Mark [name] is back for follow-up. Every time we see him, he has an issue. Last time he saw him, we had to admit him. This is because of pain issues and because of nausea and vomiting. We did find out that he had Helicobacter. He had an upper endoscopy. Biopsies proved that it Helicobacter. He now is on therapy for this.  We did go ahead and get a bone marrow biopsy on him. This was done on July 8. The bone marrow report ([id number]) showed plasma cell myeloma. It was hard for the pathologist to determine what the actual plasma cell percentage was. The biopsy specimen was marginal.  The cytogenetics however are very helpful. 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 This is similar to what he had initially. It took quite a bit of time to get him finally into remission but he finally had the transplant. Transplant was done at [facility] back in February 2015.  He had a 24 urine done. This shows 3460 milligrams of light chain excretion in the urine.  His serum light chain is 40.70 milligrams per deciliter.  He is trying to get over pneumonia now. He was in the emergency room 2 days ago. He had a CT scan. The CT angiogram did not show any PE but did show some infiltrate this is an with pneumonia. He is on Levaquin.  He still has some shortness of breath. I will go ahead and give him a nebulizer in the office. I'll put him on a inhaler at home.  Overall, his performance status is ECOG 1-2.  Medications:  Current outpatient prescriptions:  .  acetaminophen (TYLENOL) 500 MG tablet, Take 1,000 mg by mouth every 6 (six) hours as needed for moderate pain or headache (headache).  , Disp: , Rfl:  .  amoxicillin-clarithromycin-lansoprazole (PREVPAC) combo pack, Take by mouth 2 (two) times daily. Follow package directions., Disp: 1 kit, Rfl: 0 .  bisacodyl (DULCOLAX) 5 MG EC tablet, Take 5 mg by mouth daily as needed for moderate constipation (constipation). , Disp: , Rfl:  .  fentaNYL (DURAGESIC - DOSED MCG/HR) 75 MCG/HR, Place 1 patch (75 mcg total) onto the skin every 3 (three) days., Disp: 10 patch, Rfl: 0 .  gabapentin (NEURONTIN) 300 MG capsule, Take 2 capsules (600 mg total) by mouth 3 (three) times daily., Disp: 180 capsule, Rfl: 3 .  ibuprofen (ADVIL,MOTRIN) 200 MG tablet, Take 400 mg by mouth every 6 (six) hours as needed for headache., Disp: , Rfl:  .  levofloxacin (LEVAQUIN) 500 MG tablet, Take 1 tablet (500 mg total) by mouth daily., Disp: 10 tablet, Rfl: 0 .  metoCLOPramide (REGLAN) 10 MG tablet, Take 1 tablet (10 mg total) by mouth every 6 (six) hours as needed for nausea or vomiting., Disp: 20 tablet, Rfl: 0 .  ondansetron (ZOFRAN ODT) 8 MG disintegrating tablet, Take 1 tablet (8 mg total) by mouth every 8 (eight) hours as needed for nausea or vomiting., Disp: 30 tablet, Rfl: 2 .  oxyCODONE-acetaminophen (PERCOCET) 10-325 MG per tablet, Take 1 tablet by mouth every 4 (four) hours as needed for pain., Disp: 120 tablet, Rfl: 0 .  pantoprazole (PROTONIX) 40 MG tablet, Take  1 tablet (40 mg total) by mouth daily., Disp: 30 tablet, Rfl: 5 .  temazepam (RESTORIL) 22.5 MG capsule, Take 1 capsule (22.5 mg total) by mouth at bedtime as needed for sleep., Disp: 30 capsule, Rfl: 0 .  Ipratropium-Albuterol (COMBIVENT) 20-100 MCG/ACT AERS respimat, Inhale 1 puff into the lungs every 6 (six) hours., Disp: 1 Inhaler, Rfl: 0 .  montelukast (SINGULAIR) 10 MG tablet, Take 1 tablet (10 mg total) by mouth at bedtime., Disp: 14 tablet, Rfl: 0  Allergies:  Allergies  Allergen Reactions  . Gadolinium Derivatives Nausea And Vomiting    Pt became very nauseous and got sick.     Past  Medical History, Surgical history, Social history, and Family History were reviewed and updated.  Review of Systems: As above  Physical Exam:  height is '5\' 10"'  (1.778 m) and weight is 197 lb (89.359 kg). His oral temperature is 98.4 F (36.9 C). His blood pressure is 115/71 and his pulse is 91. His respiration is 18.   Wt Readings from Last 3 Encounters:  02/11/15 197 lb (89.359 kg)  02/09/15 192 lb (87.091 kg)  02/08/15 192 lb 1.6 oz (87.136 kg)     Slightly ill-appearing African-American male. Head and neck exam shows no ocular or oral lesions. He has no palpable cervical or supraclavicular lymph nodes. Lungs are with some scattered wheezes bilaterally. He has some crackles over on the right side. Cardiac exam slightly tachycardic but regular. He has no murmurs, rubs or bruits. Abdomen is soft. He has good bowel sounds. There is no fluid wave. There is no palpable liver or spleen tip. Back exam shows some tenderness over the thoracic or lumbar spine. Extremities shows no clubbing, cyanosis or edema. He has decent range of motion of his joints. Skin exam slightly dry. Neurological exam is nonfocal.  Lab Results  Component Value Date   WBC 3.7* 02/09/2015   HGB 10.5* 02/09/2015   HCT 31.5* 02/09/2015   MCV 92.1 02/09/2015   PLT 121* 02/09/2015     Chemistry      Component Value Date/Time   NA 139 02/09/2015 1833   NA 139 02/04/2015 1124   NA 145 02/21/2013 0908   K 3.5 02/09/2015 1833   K 4.3 02/04/2015 1124   K 3.8 02/21/2013 0908   CL 102 02/09/2015 1833   CL 102 02/04/2015 1124   CO2 32 02/09/2015 1833   CO2 30 02/04/2015 1124   CO2 32* 02/21/2013 0908   BUN 8 02/09/2015 1833   BUN 10 02/04/2015 1124   BUN 19.5 02/21/2013 0908   CREATININE 1.16 02/09/2015 1833   CREATININE 1.1 02/04/2015 1124   CREATININE 1.5* 02/21/2013 0908      Component Value Date/Time   CALCIUM 8.9 02/09/2015 1833   CALCIUM 11.1* 02/04/2015 1124   CALCIUM 17.7 Repeated and Verified* 02/21/2013  0908   CALCIUM >15.0* 02/01/2013 1230   ALKPHOS 53 02/06/2015 0805   ALKPHOS 65 02/04/2015 1124   AST 19 02/06/2015 0805   AST 31 02/04/2015 1124   ALT 24 02/06/2015 0805   ALT 40 02/04/2015 1124   BILITOT 2.2* 02/06/2015 0805   BILITOT 1.90* 02/04/2015 1124         Impression and Plan: Mr. Belair is 44 year old African-American gentleman. He has relapsed light chain myeloma. He has Kappa light chain myeloma. He underwent transplant back in February 2015.  It is obvious that his recurrence is aggressive. He has adverse chromosome features.  I think the only hope for  him would ultimately be an allogeneic stem cell transplant. This would be a lot tougher. It would be I think his only chance for long-term survival area did I will put a call into his transplant doctors at [facility].  For now, I think we have to try to get control over his disease. I think we can do this with a new regimen that has been tested in clinical trial. This uses Daratumumab with Velcade and Decadron. This I think would be very reasonable. I think the Daratumumab would be able to help overcome the adverse chromosomal abnormalities that he has.  I spent about 45 minutes with he and his wife. I am devastated over the relapse that he has had. He's done everything we've asked him to do. He's been on maintenance therapy. He just has bad disease.  I went over the Daratumumab side effects. I gave him an information sheet about Daratumumab. I put him on some Singulair. This may also help with his lungs. We will try to get started next week. Hopefully, this pneumonia will improve.  This is just a very, very tough problem to deal with now. The fact that it is post transplant makes it more tricky.  I answered all their questions. I  will pray hard for him.  EN Henreitta Cea, MD 7/13/20165:33 PM

## 2015-02-12 ENCOUNTER — Telehealth: Payer: Self-pay | Admitting: Hematology & Oncology

## 2015-02-12 NOTE — Telephone Encounter (Signed)
Tried to call and recording says unavailable at this time

## 2015-02-16 ENCOUNTER — Telehealth: Payer: Self-pay | Admitting: Hematology & Oncology

## 2015-02-16 ENCOUNTER — Ambulatory Visit (HOSPITAL_COMMUNITY): Admission: RE | Admit: 2015-02-16 | Payer: BLUE CROSS/BLUE SHIELD | Source: Ambulatory Visit

## 2015-02-16 NOTE — Telephone Encounter (Addendum)
Carmine [id number]    02/18/2015 10-21-2015                 [place] [id number]      02/18/2015 10-21-2015            [id number] VELCADE    Exp: 10/28/2015     P: [facility] @ [place]

## 2015-02-17 LAB — CHROMOSOME ANALYSIS, BONE MARROW

## 2015-02-17 LAB — TISSUE HYBRIDIZATION (BONE MARROW)-NCBH

## 2015-02-18 ENCOUNTER — Ambulatory Visit (HOSPITAL_BASED_OUTPATIENT_CLINIC_OR_DEPARTMENT_OTHER): Payer: BLUE CROSS/BLUE SHIELD

## 2015-02-18 ENCOUNTER — Other Ambulatory Visit (HOSPITAL_BASED_OUTPATIENT_CLINIC_OR_DEPARTMENT_OTHER): Payer: BLUE CROSS/BLUE SHIELD

## 2015-02-18 VITALS — BP 130/76 | HR 79 | Temp 98.2°F | Resp 18

## 2015-02-18 DIAGNOSIS — Z5112 Encounter for antineoplastic immunotherapy: Secondary | ICD-10-CM

## 2015-02-18 DIAGNOSIS — C9000 Multiple myeloma not having achieved remission: Secondary | ICD-10-CM

## 2015-02-18 DIAGNOSIS — J181 Lobar pneumonia, unspecified organism: Principal | ICD-10-CM

## 2015-02-18 DIAGNOSIS — J189 Pneumonia, unspecified organism: Secondary | ICD-10-CM

## 2015-02-18 LAB — CBC WITH DIFFERENTIAL (CANCER CENTER ONLY)
BASO#: 0 10*3/uL (ref 0.0–0.2)
BASO%: 0.1 % (ref 0.0–2.0)
EOS%: 0 % (ref 0.0–7.0)
Eosinophils Absolute: 0 10*3/uL (ref 0.0–0.5)
HEMATOCRIT: 30.8 % — AB (ref 38.7–49.9)
HEMOGLOBIN: 10.7 g/dL — AB (ref 13.0–17.1)
LYMPH#: 0.8 10*3/uL — ABNORMAL LOW (ref 0.9–3.3)
LYMPH%: 5.7 % — AB (ref 14.0–48.0)
MCH: 32 pg (ref 28.0–33.4)
MCHC: 34.7 g/dL (ref 32.0–35.9)
MCV: 92 fL (ref 82–98)
MONO#: 0.7 10*3/uL (ref 0.1–0.9)
MONO%: 4.8 % (ref 0.0–13.0)
NEUT#: 13.2 10*3/uL — ABNORMAL HIGH (ref 1.5–6.5)
NEUT%: 89.4 % — AB (ref 40.0–80.0)
Platelets: 140 10*3/uL — ABNORMAL LOW (ref 145–400)
RBC: 3.34 10*6/uL — ABNORMAL LOW (ref 4.20–5.70)
RDW: 14 % (ref 11.1–15.7)
WBC: 14.8 10*3/uL — ABNORMAL HIGH (ref 4.0–10.0)

## 2015-02-18 LAB — CMP (CANCER CENTER ONLY)
ALK PHOS: 75 U/L (ref 26–84)
ALT(SGPT): 24 U/L (ref 10–47)
AST: 28 U/L (ref 11–38)
Albumin: 4.4 g/dL (ref 3.3–5.5)
BUN, Bld: 17 mg/dL (ref 7–22)
CO2: 23 mEq/L (ref 18–33)
CREATININE: 1.3 mg/dL — AB (ref 0.6–1.2)
Calcium: 11.3 mg/dL — ABNORMAL HIGH (ref 8.0–10.3)
Chloride: 107 mEq/L (ref 98–108)
GLUCOSE: 202 mg/dL — AB (ref 73–118)
Potassium: 4.4 mEq/L (ref 3.3–4.7)
Sodium: 139 mEq/L (ref 128–145)
Total Bilirubin: 1.3 mg/dl (ref 0.20–1.60)
Total Protein: 7.6 g/dL (ref 6.4–8.1)

## 2015-02-18 MED ORDER — DIPHENHYDRAMINE HCL 25 MG PO CAPS
50.0000 mg | ORAL_CAPSULE | Freq: Once | ORAL | Status: AC
  Administered 2015-02-18: 50 mg via ORAL

## 2015-02-18 MED ORDER — ACETAMINOPHEN 325 MG PO TABS
650.0000 mg | ORAL_TABLET | Freq: Once | ORAL | Status: AC
  Administered 2015-02-18: 650 mg via ORAL

## 2015-02-18 MED ORDER — DEXAMETHASONE 4 MG PO TABS: 8.0000 mg | ORAL_TABLET | Freq: Two times a day (BID) | ORAL | Status: DC

## 2015-02-18 MED ORDER — DIPHENHYDRAMINE HCL 25 MG PO CAPS
ORAL_CAPSULE | ORAL | Status: AC
Start: 2015-02-18 — End: 2015-02-18
  Filled 2015-02-18: qty 2

## 2015-02-18 MED ORDER — SODIUM CHLORIDE 0.9 % IJ SOLN
10.0000 mL | INTRAMUSCULAR | Status: DC | PRN
  Administered 2015-02-18: 10 mL
  Filled 2015-02-18: qty 10

## 2015-02-18 MED ORDER — ACETAMINOPHEN 325 MG PO TABS
ORAL_TABLET | ORAL | Status: AC
  Filled 2015-02-18: qty 2

## 2015-02-18 MED ORDER — LORAZEPAM 0.5 MG PO TABS: 0.5000 mg | ORAL_TABLET | Freq: Four times a day (QID) | ORAL | Status: AC | PRN

## 2015-02-18 MED ORDER — SODIUM CHLORIDE 0.9 % IV SOLN
15.6000 mg/kg | Freq: Once | INTRAVENOUS | Status: AC
  Administered 2015-02-18: 1400 mg via INTRAVENOUS
  Filled 2015-02-18: qty 70

## 2015-02-18 MED ORDER — METHYLPREDNISOLONE SODIUM SUCC 125 MG IJ SOLR
125.0000 mg | Freq: Once | INTRAMUSCULAR | Status: AC
  Administered 2015-02-18: 125 mg via INTRAVENOUS

## 2015-02-18 MED ORDER — LIDOCAINE-PRILOCAINE 2.5-2.5 % EX CREA: TOPICAL_CREAM | CUTANEOUS | Status: AC

## 2015-02-18 MED ORDER — PROCHLORPERAZINE MALEATE 10 MG PO TABS: 10.0000 mg | ORAL_TABLET | Freq: Four times a day (QID) | ORAL | Status: AC | PRN

## 2015-02-18 MED ORDER — BORTEZOMIB CHEMO SQ INJECTION 3.5 MG (2.5MG/ML)
1.3000 mg/m2 | Freq: Once | INTRAMUSCULAR | Status: AC
  Administered 2015-02-18: 2.75 mg via SUBCUTANEOUS
  Filled 2015-02-18: qty 2.75

## 2015-02-18 MED ORDER — ACYCLOVIR 400 MG PO TABS: 400.0000 mg | ORAL_TABLET | Freq: Two times a day (BID) | ORAL | Status: DC

## 2015-02-18 MED ORDER — METHYLPREDNISOLONE SODIUM SUCC 125 MG IJ SOLR
INTRAMUSCULAR | Status: AC
  Filled 2015-02-18: qty 2

## 2015-02-18 MED ORDER — SODIUM CHLORIDE 0.9 % IV SOLN
Freq: Once | INTRAVENOUS | Status: AC
  Administered 2015-02-18: 09:00:00 via INTRAVENOUS

## 2015-02-18 MED ORDER — SODIUM CHLORIDE 0.9 % IV SOLN
Freq: Once | INTRAVENOUS | Status: AC
  Administered 2015-02-18: 10:00:00 via INTRAVENOUS
  Filled 2015-02-18: qty 4

## 2015-02-18 MED ORDER — ONDANSETRON HCL 8 MG PO TABS: 8.0000 mg | ORAL_TABLET | Freq: Two times a day (BID) | ORAL | Status: DC

## 2015-02-18 MED ORDER — HEPARIN SOD (PORK) LOCK FLUSH 100 UNIT/ML IV SOLN
500.0000 [IU] | Freq: Once | INTRAVENOUS | Status: AC | PRN
  Administered 2015-02-18: 500 [IU]
  Filled 2015-02-18: qty 5

## 2015-02-18 NOTE — Patient Instructions (Signed)
Bortezomib injection What is this medicine? BORTEZOMIB (bor TEZ oh mib) is a chemotherapy drug. It slows the growth of cancer cells. This medicine is used to treat multiple myeloma, and certain lymphomas, such as mantle-cell lymphoma. This medicine may be used for other purposes; ask your health care provider or pharmacist if you have questions. COMMON BRAND NAME(S): Velcade What should I tell my health care provider before I take this medicine? They need to know if you have any of these conditions: -diabetes -heart disease -irregular heartbeat -liver disease -on hemodialysis -low blood counts, like low white blood cells, platelets, or hemoglobin -peripheral neuropathy -taking medicine for blood pressure -an unusual or allergic reaction to bortezomib, mannitol, boron, other medicines, foods, dyes, or preservatives -pregnant or trying to get pregnant -breast-feeding How should I use this medicine? This medicine is for injection into a vein or for injection under the skin. It is given by a health care professional in a hospital or clinic setting. Talk to your pediatrician regarding the use of this medicine in children. Special care may be needed. Overdosage: If you think you have taken too much of this medicine contact a poison control center or emergency room at once. NOTE: This medicine is only for you. Do not share this medicine with others. What if I miss a dose? It is important not to miss your dose. Call your doctor or health care professional if you are unable to keep an appointment. What may interact with this medicine? This medicine may interact with the following medications: -ketoconazole -rifampin -ritonavir -St. John's Wort This list may not describe all possible interactions. Give your health care provider a list of all the medicines, herbs, non-prescription drugs, or dietary supplements you use. Also tell them if you smoke, drink alcohol, or use illegal drugs. Some items  may interact with your medicine. What should I watch for while using this medicine? Visit your doctor for checks on your progress. This drug may make you feel generally unwell. This is not uncommon, as chemotherapy can affect healthy cells as well as cancer cells. Report any side effects. Continue your course of treatment even though you feel ill unless your doctor tells you to stop. You may get drowsy or dizzy. Do not drive, use machinery, or do anything that needs mental alertness until you know how this medicine affects you. Do not stand or sit up quickly, especially if you are an older patient. This reduces the risk of dizzy or fainting spells. In some cases, you may be given additional medicines to help with side effects. Follow all directions for their use. Call your doctor or health care professional for advice if you get a fever, chills or sore throat, or other symptoms of a cold or flu. Do not treat yourself. This drug decreases your body's ability to fight infections. Try to avoid being around people who are sick. This medicine may increase your risk to bruise or bleed. Call your doctor or health care professional if you notice any unusual bleeding. You may need blood work done while you are taking this medicine. In some patients, this medicine may cause a serious brain infection that may cause death. If you have any problems seeing, thinking, speaking, walking, or standing, tell your doctor right away. If you cannot reach your doctor, urgently seek other source of medical care. Do not become pregnant while taking this medicine. Women should inform their doctor if they wish to become pregnant or think they might be pregnant. There is   a potential for serious side effects to an unborn child. Talk to your health care professional or pharmacist for more information. Do not breast-feed an infant while taking this medicine. Check with your doctor or health care professional if you get an attack of  severe diarrhea, nausea and vomiting, or if you sweat a lot. The loss of too much body fluid can make it dangerous for you to take this medicine. What side effects may I notice from receiving this medicine? Side effects that you should report to your doctor or health care professional as soon as possible: -allergic reactions like skin rash, itching or hives, swelling of the face, lips, or tongue -breathing problems -changes in hearing -changes in vision -fast, irregular heartbeat -feeling faint or lightheaded, falls -pain, tingling, numbness in the hands or feet -right upper belly pain -seizures -swelling of the ankles, feet, hands -unusual bleeding or bruising -unusually weak or tired -vomiting -yellowing of the eyes or skin Side effects that usually do not require medical attention (report to your doctor or health care professional if they continue or are bothersome): -changes in emotions or moods -constipation -diarrhea -loss of appetite -headache -irritation at site where injected -nausea This list may not describe all possible side effects. Call your doctor for medical advice about side effects. You may report side effects to FDA at 1-800-FDA-1088. Where should I keep my medicine? This drug is given in a hospital or clinic and will not be stored at home. NOTE: This sheet is a summary. It may not cover all possible information. If you have questions about this medicine, talk to your doctor, pharmacist, or health care provider.  2015, [employer]vier/Gold Standard. (2013-05-13 12:46:32)  

## 2015-02-19 ENCOUNTER — Ambulatory Visit: Payer: BLUE CROSS/BLUE SHIELD | Admitting: Radiation Oncology

## 2015-02-19 ENCOUNTER — Telehealth: Payer: Self-pay | Admitting: *Deleted

## 2015-02-19 NOTE — Telephone Encounter (Signed)
Patient is doing well after his chemotherapy yesterday. He feels good without any symptoms or side effects related to the infusion. He has is prn medicines at home if needed. Currently he has no questions or concerns. He knows to call the office with any questions, concerns or new side effects.

## 2015-02-25 ENCOUNTER — Ambulatory Visit: Payer: BLUE CROSS/BLUE SHIELD

## 2015-02-25 ENCOUNTER — Ambulatory Visit (HOSPITAL_BASED_OUTPATIENT_CLINIC_OR_DEPARTMENT_OTHER): Payer: BLUE CROSS/BLUE SHIELD | Admitting: Family

## 2015-02-25 ENCOUNTER — Other Ambulatory Visit: Payer: Self-pay | Admitting: Oncology

## 2015-02-25 ENCOUNTER — Ambulatory Visit (HOSPITAL_BASED_OUTPATIENT_CLINIC_OR_DEPARTMENT_OTHER): Payer: BLUE CROSS/BLUE SHIELD

## 2015-02-25 ENCOUNTER — Other Ambulatory Visit (HOSPITAL_BASED_OUTPATIENT_CLINIC_OR_DEPARTMENT_OTHER): Payer: BLUE CROSS/BLUE SHIELD

## 2015-02-25 VITALS — BP 138/106 | HR 108 | Temp 98.5°F | Resp 20 | Wt 198.0 lb

## 2015-02-25 VITALS — BP 144/86 | HR 84 | Resp 20

## 2015-02-25 DIAGNOSIS — C9000 Multiple myeloma not having achieved remission: Secondary | ICD-10-CM

## 2015-02-25 DIAGNOSIS — R112 Nausea with vomiting, unspecified: Secondary | ICD-10-CM

## 2015-02-25 DIAGNOSIS — Z5112 Encounter for antineoplastic immunotherapy: Secondary | ICD-10-CM

## 2015-02-25 DIAGNOSIS — G4701 Insomnia due to medical condition: Secondary | ICD-10-CM

## 2015-02-25 DIAGNOSIS — J181 Lobar pneumonia, unspecified organism: Principal | ICD-10-CM

## 2015-02-25 DIAGNOSIS — J189 Pneumonia, unspecified organism: Secondary | ICD-10-CM

## 2015-02-25 LAB — CMP (CANCER CENTER ONLY)
ALBUMIN: 4.3 g/dL (ref 3.3–5.5)
ALK PHOS: 75 U/L (ref 26–84)
ALT(SGPT): 47 U/L (ref 10–47)
AST: 34 U/L (ref 11–38)
BUN: 20 mg/dL (ref 7–22)
CO2: 25 mEq/L (ref 18–33)
Calcium: 12.7 mg/dL — ABNORMAL HIGH (ref 8.0–10.3)
Chloride: 100 mEq/L (ref 98–108)
Creat: 1.3 mg/dl — ABNORMAL HIGH (ref 0.6–1.2)
Glucose, Bld: 220 mg/dL — ABNORMAL HIGH (ref 73–118)
POTASSIUM: 3.9 meq/L (ref 3.3–4.7)
Sodium: 141 mEq/L (ref 128–145)
Total Bilirubin: 1.7 mg/dl — ABNORMAL HIGH (ref 0.20–1.60)
Total Protein: 7.6 g/dL (ref 6.4–8.1)

## 2015-02-25 LAB — CBC WITH DIFFERENTIAL (CANCER CENTER ONLY)
BASO#: 0 10*3/uL (ref 0.0–0.2)
BASO%: 0.1 % (ref 0.0–2.0)
EOS%: 0 % (ref 0.0–7.0)
Eosinophils Absolute: 0 10*3/uL (ref 0.0–0.5)
HCT: 32.3 % — ABNORMAL LOW (ref 38.7–49.9)
HEMOGLOBIN: 11.4 g/dL — AB (ref 13.0–17.1)
LYMPH#: 1 10*3/uL (ref 0.9–3.3)
LYMPH%: 7.9 % — AB (ref 14.0–48.0)
MCH: 32.5 pg (ref 28.0–33.4)
MCHC: 35.3 g/dL (ref 32.0–35.9)
MCV: 92 fL (ref 82–98)
MONO#: 1 10*3/uL — ABNORMAL HIGH (ref 0.1–0.9)
MONO%: 8.4 % (ref 0.0–13.0)
NEUT%: 83.6 % — ABNORMAL HIGH (ref 40.0–80.0)
NEUTROS ABS: 10.4 10*3/uL — AB (ref 1.5–6.5)
Platelets: 109 10*3/uL — ABNORMAL LOW (ref 145–400)
RBC: 3.51 10*6/uL — ABNORMAL LOW (ref 4.20–5.70)
RDW: 14.2 % (ref 11.1–15.7)
WBC: 12.4 10*3/uL — ABNORMAL HIGH (ref 4.0–10.0)

## 2015-02-25 MED ORDER — SODIUM CHLORIDE 0.9 % IV SOLN
Freq: Once | INTRAVENOUS | Status: AC
  Administered 2015-02-25: 09:00:00 via INTRAVENOUS

## 2015-02-25 MED ORDER — ACETAMINOPHEN 325 MG PO TABS
ORAL_TABLET | ORAL | Status: AC
  Filled 2015-02-25: qty 2

## 2015-02-25 MED ORDER — HEPARIN SOD (PORK) LOCK FLUSH 100 UNIT/ML IV SOLN
500.0000 [IU] | Freq: Once | INTRAVENOUS | Status: AC | PRN
  Administered 2015-02-25: 500 [IU]
  Filled 2015-02-25: qty 5

## 2015-02-25 MED ORDER — BORTEZOMIB CHEMO SQ INJECTION 3.5 MG (2.5MG/ML)
1.3000 mg/m2 | Freq: Once | INTRAMUSCULAR | Status: AC
  Administered 2015-02-25: 2.75 mg via SUBCUTANEOUS
  Filled 2015-02-25: qty 2.75

## 2015-02-25 MED ORDER — HYDROMORPHONE HCL 4 MG/ML IJ SOLN
INTRAMUSCULAR | Status: AC
  Filled 2015-02-25: qty 1

## 2015-02-25 MED ORDER — ONDANSETRON HCL 40 MG/20ML IJ SOLN
Freq: Once | INTRAMUSCULAR | Status: AC
  Administered 2015-02-25: 10:00:00 via INTRAVENOUS
  Filled 2015-02-25: qty 4

## 2015-02-25 MED ORDER — IPRATROPIUM-ALBUTEROL 20-100 MCG/ACT IN AERS: 1.0000 | INHALATION_SPRAY | Freq: Four times a day (QID) | RESPIRATORY_TRACT | Status: DC

## 2015-02-25 MED ORDER — HYDROMORPHONE HCL 4 MG PO TABS: ORAL_TABLET | ORAL | Status: DC

## 2015-02-25 MED ORDER — OXYCODONE-ACETAMINOPHEN 5-325 MG PO TABS
ORAL_TABLET | ORAL | Status: AC
  Filled 2015-02-25: qty 1

## 2015-02-25 MED ORDER — HYDROMORPHONE HCL 4 MG/ML IJ SOLN
2.0000 mg | Freq: Once | INTRAMUSCULAR | Status: AC
  Administered 2015-02-25: 2 mg via INTRAVENOUS

## 2015-02-25 MED ORDER — DIPHENHYDRAMINE HCL 25 MG PO CAPS
50.0000 mg | ORAL_CAPSULE | Freq: Once | ORAL | Status: AC
  Administered 2015-02-25: 50 mg via ORAL

## 2015-02-25 MED ORDER — OXYCODONE-ACETAMINOPHEN 5-325 MG PO TABS
2.0000 | ORAL_TABLET | Freq: Once | ORAL | Status: AC
  Administered 2015-02-25: 1 via ORAL

## 2015-02-25 MED ORDER — DIPHENHYDRAMINE HCL 25 MG PO CAPS
ORAL_CAPSULE | ORAL | Status: AC
  Filled 2015-02-25: qty 2

## 2015-02-25 MED ORDER — METHYLPREDNISOLONE SODIUM SUCC 125 MG IJ SOLR
125.0000 mg | Freq: Once | INTRAMUSCULAR | Status: AC
  Administered 2015-02-25: 125 mg via INTRAVENOUS

## 2015-02-25 MED ORDER — ONDANSETRON HCL 8 MG PO TABS
ORAL_TABLET | ORAL | Status: AC
  Filled 2015-02-25: qty 1

## 2015-02-25 MED ORDER — ONDANSETRON HCL 8 MG PO TABS: 8.0000 mg | ORAL_TABLET | Freq: Once | ORAL | Status: DC

## 2015-02-25 MED ORDER — SODIUM CHLORIDE 0.9 % IJ SOLN
10.0000 mL | INTRAMUSCULAR | Status: DC | PRN
  Administered 2015-02-25: 10 mL
  Filled 2015-02-25: qty 10

## 2015-02-25 MED ORDER — ACETAMINOPHEN 325 MG PO TABS
650.0000 mg | ORAL_TABLET | Freq: Once | ORAL | Status: AC
  Administered 2015-02-25: 650 mg via ORAL

## 2015-02-25 MED ORDER — DARATUMUMAB CHEMO INJECTION 400 MG/20ML
15.6000 mg/kg | Freq: Once | INTRAVENOUS | Status: AC
  Administered 2015-02-25: 1400 mg via INTRAVENOUS
  Filled 2015-02-25: qty 70

## 2015-02-25 MED ORDER — MONTELUKAST SODIUM 10 MG PO TABS: 10.0000 mg | ORAL_TABLET | Freq: Every day | ORAL | Status: DC

## 2015-02-25 MED ORDER — ZOLEDRONIC ACID 4 MG/100ML IV SOLN
4.0000 mg | Freq: Once | INTRAVENOUS | Status: AC
  Administered 2015-02-25: 4 mg via INTRAVENOUS
  Filled 2015-02-25: qty 100

## 2015-02-25 MED ORDER — DEXAMETHASONE 4 MG PO TABS: ORAL_TABLET | ORAL | Status: DC

## 2015-02-25 MED ORDER — METHYLPREDNISOLONE SODIUM SUCC 125 MG IJ SOLR
INTRAMUSCULAR | Status: AC
  Filled 2015-02-25: qty 2

## 2015-02-25 NOTE — Patient Instructions (Signed)
Bortezomib injection What is this medicine? BORTEZOMIB (bor TEZ oh mib) is a chemotherapy drug. It slows the growth of cancer cells. This medicine is used to treat multiple myeloma, and certain lymphomas, such as mantle-cell lymphoma. This medicine may be used for other purposes; ask your health care provider or pharmacist if you have questions. COMMON BRAND NAME(S): Velcade What should I tell my health care provider before I take this medicine? They need to know if you have any of these conditions: -diabetes -heart disease -irregular heartbeat -liver disease -on hemodialysis -low blood counts, like low white blood cells, platelets, or hemoglobin -peripheral neuropathy -taking medicine for blood pressure -an unusual or allergic reaction to bortezomib, mannitol, boron, other medicines, foods, dyes, or preservatives -pregnant or trying to get pregnant -breast-feeding How should I use this medicine? This medicine is for injection into a vein or for injection under the skin. It is given by a health care professional in a hospital or clinic setting. Talk to your pediatrician regarding the use of this medicine in children. Special care may be needed. Overdosage: If you think you have taken too much of this medicine contact a poison control center or emergency room at once. NOTE: This medicine is only for you. Do not share this medicine with others. What if I miss a dose? It is important not to miss your dose. Call your doctor or health care professional if you are unable to keep an appointment. What may interact with this medicine? This medicine may interact with the following medications: -ketoconazole -rifampin -ritonavir -St. John's Wort This list may not describe all possible interactions. Give your health care provider a list of all the medicines, herbs, non-prescription drugs, or dietary supplements you use. Also tell them if you smoke, drink alcohol, or use illegal drugs. Some items  may interact with your medicine. What should I watch for while using this medicine? Visit your doctor for checks on your progress. This drug may make you feel generally unwell. This is not uncommon, as chemotherapy can affect healthy cells as well as cancer cells. Report any side effects. Continue your course of treatment even though you feel ill unless your doctor tells you to stop. You may get drowsy or dizzy. Do not drive, use machinery, or do anything that needs mental alertness until you know how this medicine affects you. Do not stand or sit up quickly, especially if you are an older patient. This reduces the risk of dizzy or fainting spells. In some cases, you may be given additional medicines to help with side effects. Follow all directions for their use. Call your doctor or health care professional for advice if you get a fever, chills or sore throat, or other symptoms of a cold or flu. Do not treat yourself. This drug decreases your body's ability to fight infections. Try to avoid being around people who are sick. This medicine may increase your risk to bruise or bleed. Call your doctor or health care professional if you notice any unusual bleeding. You may need blood work done while you are taking this medicine. In some patients, this medicine may cause a serious brain infection that may cause death. If you have any problems seeing, thinking, speaking, walking, or standing, tell your doctor right away. If you cannot reach your doctor, urgently seek other source of medical care. Do not become pregnant while taking this medicine. Women should inform their doctor if they wish to become pregnant or think they might be pregnant. There is   a potential for serious side effects to an unborn child. Talk to your health care professional or pharmacist for more information. Do not breast-feed an infant while taking this medicine. Check with your doctor or health care professional if you get an attack of  severe diarrhea, nausea and vomiting, or if you sweat a lot. The loss of too much body fluid can make it dangerous for you to take this medicine. What side effects may I notice from receiving this medicine? Side effects that you should report to your doctor or health care professional as soon as possible: -allergic reactions like skin rash, itching or hives, swelling of the face, lips, or tongue -breathing problems -changes in hearing -changes in vision -fast, irregular heartbeat -feeling faint or lightheaded, falls -pain, tingling, numbness in the hands or feet -right upper belly pain -seizures -swelling of the ankles, feet, hands -unusual bleeding or bruising -unusually weak or tired -vomiting -yellowing of the eyes or skin Side effects that usually do not require medical attention (report to your doctor or health care professional if they continue or are bothersome): -changes in emotions or moods -constipation -diarrhea -loss of appetite -headache -irritation at site where injected -nausea This list may not describe all possible side effects. Call your doctor for medical advice about side effects. You may report side effects to FDA at 1-800-FDA-1088. Where should I keep my medicine? This drug is given in a hospital or clinic and will not be stored at home. NOTE: This sheet is a summary. It may not cover all possible information. If you have questions about this medicine, talk to your doctor, pharmacist, or health care provider.  2015, [employer]. (2013-05-13 12:46:32) Zoledronic Acid injection (Hypercalcemia, Oncology) What is this medicine? ZOLEDRONIC ACID (ZOE le dron ik AS id) lowers the amount of calcium loss from bone. It is used to treat too much calcium in your blood from cancer. It is also used to prevent complications of cancer that has spread to the bone. This medicine may be used for other purposes; ask your health care provider or pharmacist if you have  questions. COMMON BRAND NAME(S): Zometa What should I tell my health care provider before I take this medicine? They need to know if you have any of these conditions: -aspirin-sensitive asthma -cancer, especially if you are receiving medicines used to treat cancer -dental disease or wear dentures -infection -kidney disease -receiving corticosteroids like dexamethasone or prednisone -an unusual or allergic reaction to zoledronic acid, other medicines, foods, dyes, or preservatives -pregnant or trying to get pregnant -breast-feeding How should I use this medicine? This medicine is for infusion into a vein. It is given by a health care professional in a hospital or clinic setting. Talk to your pediatrician regarding the use of this medicine in children. Special care may be needed. Overdosage: If you think you have taken too much of this medicine contact a poison control center or emergency room at once. NOTE: This medicine is only for you. Do not share this medicine with others. What if I miss a dose? It is important not to miss your dose. Call your doctor or health care professional if you are unable to keep an appointment. What may interact with this medicine? -certain antibiotics given by injection -NSAIDs, medicines for pain and inflammation, like ibuprofen or naproxen -some diuretics like bumetanide, furosemide -teriparatide -thalidomide This list may not describe all possible interactions. Give your health care provider a list of all the medicines, herbs, non-prescription drugs, or dietary   supplements you use. Also tell them if you smoke, drink alcohol, or use illegal drugs. Some items may interact with your medicine. What should I watch for while using this medicine? Visit your doctor or health care professional for regular checkups. It may be some time before you see the benefit from this medicine. Do not stop taking your medicine unless your doctor tells you to. Your doctor may  order blood tests or other tests to see how you are doing. Women should inform their doctor if they wish to become pregnant or think they might be pregnant. There is a potential for serious side effects to an unborn child. Talk to your health care professional or pharmacist for more information. You should make sure that you get enough calcium and vitamin D while you are taking this medicine. Discuss the foods you eat and the vitamins you take with your health care professional. Some people who take this medicine have severe bone, joint, and/or muscle pain. This medicine may also increase your risk for jaw problems or a broken thigh bone. Tell your doctor right away if you have severe pain in your jaw, bones, joints, or muscles. Tell your doctor if you have any pain that does not go away or that gets worse. Tell your dentist and dental surgeon that you are taking this medicine. You should not have major dental surgery while on this medicine. See your dentist to have a dental exam and fix any dental problems before starting this medicine. Take good care of your teeth while on this medicine. Make sure you see your dentist for regular follow-up appointments. What side effects may I notice from receiving this medicine? Side effects that you should report to your doctor or health care professional as soon as possible: -allergic reactions like skin rash, itching or hives, swelling of the face, lips, or tongue -anxiety, confusion, or depression -breathing problems -changes in vision -eye pain -feeling faint or lightheaded, falls -jaw pain, especially after dental work -mouth sores -muscle cramps, stiffness, or weakness -trouble passing urine or change in the amount of urine Side effects that usually do not require medical attention (report to your doctor or health care professional if they continue or are bothersome): -bone, joint, or muscle pain -constipation -diarrhea -fever -hair loss -irritation  at site where injected -loss of appetite -nausea, vomiting -stomach upset -trouble sleeping -trouble swallowing -weak or tired This list may not describe all possible side effects. Call your doctor for medical advice about side effects. You may report side effects to FDA at 1-800-FDA-1088. Where should I keep my medicine? This drug is given in a hospital or clinic and will not be stored at home. NOTE: This sheet is a summary. It may not cover all possible information. If you have questions about this medicine, talk to your doctor, pharmacist, or health care provider.  2015, [employer]. (2012-12-27 13:03:13)  

## 2015-02-25 NOTE — Progress Notes (Signed)
Hematology and Oncology Follow Up Visit  Mark [name] [id number] 1970/11/17 44 y.o. 02/25/2015   Principle Diagnosis:  Relapsed kappa light chain myeloma - July 2016  - transplant at [facility] in February 2015   Current Therapy:   Salvage therapy with Darzalex/Velcade/Decadron s/p cycle 2 Zometa 4 mg IV every month    Interim History: [name] is here today for follow-up and cycle 3 of treatment. He is feeling a little better. He had a rough weekend. On Saturday, he started having nausea (vomiting twice), fatigue, weakness and headaches. He is having some aching in his left chest. He denies cardiac pain. Today he is starting to feel a little better.  We did recheck his SPEP today. He has finished his antibiotic therapy for Helicobacter and pneumonia.  He deniesfever, chills, cough, blurred vision, constipation, diarrhea, rash, chest pain, palpitations, abdominal pain, problems urinating or blood in urine or stool. No SOB at this time.  No swelling in his extremities. He still has numbness and tingling in his feet and legs. This is unchanged. He still has some back discomfort. He has had no falls.  His appetite comes and goes. He has been able to drink fluids and stay hydrated. His weight is stable.    Medications:    Medication List       This list is accurate as of: 02/25/15  9:06 AM.  Always use your most recent med list.               acetaminophen 500 MG tablet  Commonly known as:  TYLENOL  Take 1,000 mg by mouth every 6 (six) hours as needed for moderate pain or headache (headache).     bisacodyl 5 MG EC tablet  Commonly known as:  DULCOLAX  Take 5 mg by mouth daily as needed for moderate constipation (constipation).     dexamethasone 4 MG tablet  Commonly known as:  DECADRON  Take 5 pills once a week with food for 3 weeks in a row then off 1 week.     famciclovir 500 MG tablet  Commonly known as:  FAMVIR  Take 1 tablet (500 mg total) by mouth daily.     fentaNYL 75 MCG/HR  Commonly known as:  DURAGESIC - dosed mcg/hr  Place 1 patch (75 mcg total) onto the skin every 3 (three) days.     gabapentin 300 MG capsule  Commonly known as:  NEURONTIN  Take 2 capsules (600 mg total) by mouth 3 (three) times daily.     ibuprofen 200 MG tablet  Commonly known as:  ADVIL,MOTRIN  Take 400 mg by mouth every 6 (six) hours as needed for headache.     Ipratropium-Albuterol 20-100 MCG/ACT Aers respimat  Commonly known as:  COMBIVENT  Inhale 1 puff into the lungs every 6 (six) hours.     lidocaine-prilocaine cream  Commonly known as:  EMLA  Apply to affected area once     LORazepam 0.5 MG tablet  Commonly known as:  ATIVAN  Take 1 tablet (0.5 mg total) by mouth every 6 (six) hours as needed (Nausea or vomiting).     metoCLOPramide 10 MG tablet  Commonly known as:  REGLAN  Take 1 tablet (10 mg total) by mouth every 6 (six) hours as needed for nausea or vomiting.     montelukast 10 MG tablet  Commonly known as:  SINGULAIR  Take 1 tablet (10 mg total) by mouth at bedtime.     ondansetron 8 MG disintegrating  tablet  Commonly known as:  ZOFRAN ODT  Take 1 tablet (8 mg total) by mouth every 8 (eight) hours as needed for nausea or vomiting.     ondansetron 8 MG tablet  Commonly known as:  ZOFRAN  Take 1 tablet (8 mg total) by mouth 2 (two) times daily. Start the day after chemo for 2 days. Then take as needed for nausea or vomiting.     oxyCODONE-acetaminophen 10-325 MG per tablet  Commonly known as:  PERCOCET  Take 1 tablet by mouth every 4 (four) hours as needed for pain.     pantoprazole 40 MG tablet  Commonly known as:  PROTONIX  Take 1 tablet (40 mg total) by mouth daily.     prochlorperazine 10 MG tablet  Commonly known as:  COMPAZINE  Take 1 tablet (10 mg total) by mouth every 6 (six) hours as needed (Nausea or vomiting).     temazepam 22.5 MG capsule  Commonly known as:  RESTORIL  Take 1 capsule (22.5 mg total) by mouth at bedtime  as needed for sleep.        Allergies:  Allergies  Allergen Reactions  . Gadolinium Derivatives Nausea And Vomiting    Pt became very nauseous and got sick.     Past Medical History, Surgical history, Social history, and Family History were reviewed and updated.  Review of Systems: All other 10 point review of systems is negative.   Physical Exam:  weight is 198 lb (89.812 kg). His oral temperature is 98.5 F (36.9 C). His blood pressure is 138/106 and his pulse is 108. His respiration is 20.   Wt Readings from Last 3 Encounters:  02/25/15 198 lb (89.812 kg)  02/11/15 197 lb (89.359 kg)  02/09/15 192 lb (87.091 kg)    Ocular: Sclerae unicteric, pupils equal, round and reactive to light Ear-nose-throat: Oropharynx clear, dentition fair Lymphatic: No cervical or supraclavicular adenopathy Lungs no rales or rhonchi, good excursion bilaterally Heart regular rate and rhythm, no murmur appreciated Abd soft, nontender, positive bowel sounds MSK no focal spinal tenderness, no joint edema Neuro: non-focal, well-oriented, appropriate affect  Lab Results  Component Value Date   WBC 12.4* 02/25/2015   HGB 11.4* 02/25/2015   HCT 32.3* 02/25/2015   MCV 92 02/25/2015   PLT 109* 02/25/2015   No results found for: FERRITIN, IRON, TIBC, UIBC, IRONPCTSAT Lab Results  Component Value Date   RBC 3.51* 02/25/2015   Lab Results  Component Value Date   KPAFRELGTCHN 40.70* 02/04/2015   LAMBDASER 1.32 02/04/2015   KAPLAMBRATIO 30.83* 02/04/2015   Lab Results  Component Value Date   IGGSERUM 1440 02/04/2015   IGA 72 02/04/2015   IGMSERUM 22* 02/04/2015   Lab Results  Component Value Date   TOTALPROTELP 7.7 02/04/2015   ALBUMINELP 4.6 02/04/2015   A1GS 0.4* 02/04/2015   A2GS 0.8 02/04/2015   BETS 0.4 02/04/2015   BETA2SER 0.4 02/04/2015   GAMS 1.1 02/04/2015   MSPIKE 0.23 09/22/2014   SPEI * 02/04/2015     Chemistry      Component Value Date/Time   NA 139 02/18/2015  0821   NA 139 02/09/2015 1833   NA 145 02/21/2013 0908   K 4.4 02/18/2015 0821   K 3.5 02/09/2015 1833   K 3.8 02/21/2013 0908   CL 107 02/18/2015 0821   CL 102 02/09/2015 1833   CO2 23 02/18/2015 0821   CO2 32 02/09/2015 1833   CO2 32* 02/21/2013 0908   BUN  17 02/18/2015 0821   BUN 8 02/09/2015 1833   BUN 19.5 02/21/2013 0908   CREATININE 1.3* 02/18/2015 0821   CREATININE 1.16 02/09/2015 1833   CREATININE 1.5* 02/21/2013 0908      Component Value Date/Time   CALCIUM 11.3* 02/18/2015 0821   CALCIUM 8.9 02/09/2015 1833   CALCIUM 17.7 Repeated and Verified* 02/21/2013 0908   CALCIUM >15.0* 02/01/2013 1230   ALKPHOS 75 02/18/2015 0821   ALKPHOS 53 02/06/2015 0805   AST 28 02/18/2015 0821   AST 19 02/06/2015 0805   ALT 24 02/18/2015 0821   ALT 24 02/06/2015 0805   BILITOT 1.30 02/18/2015 0821   BILITOT 2.2* 02/06/2015 0805     Impression and Plan: Mr. Crehan is 44 year old African American male with relapsed kappa light chain myeloma. He did undergo stem cell transplant for his myeloma on September 11, 2013 at [facility].  He has completed 2 cycles of Daratumumab.  He had n/v, fatigue weakness and headaches over the weekend but is starting to feel a little better.  We will proceed with cycle 3 today. We did give him Dilaudid for pain and the Percocet later. He also got something for nausea.  Our goal is to get his disease under control and then hopefully an allogenic stem cell transplant.  We will continue to monitor him closely. His SPEP results are pending.  He has his current treatment and appointment schedule.  He knows to call with any questions or concerns. We can always see him sooner if need be.   [name], NP 7/27/20169:06 AM

## 2015-02-26 ENCOUNTER — Ambulatory Visit (HOSPITAL_COMMUNITY)
Admission: RE | Admit: 2015-02-26 | Discharge: 2015-02-26 | Disposition: A | Discharge status: Home / Self Care | Payer: BLUE CROSS/BLUE SHIELD | Source: Ambulatory Visit | Attending: Hematology & Oncology | Admitting: Hematology & Oncology

## 2015-02-26 ENCOUNTER — Telehealth: Payer: Self-pay | Admitting: Hematology & Oncology

## 2015-02-26 DIAGNOSIS — C9000 Multiple myeloma not having achieved remission: Secondary | ICD-10-CM

## 2015-02-26 DIAGNOSIS — G4701 Insomnia due to medical condition: Secondary | ICD-10-CM

## 2015-02-26 DIAGNOSIS — R112 Nausea with vomiting, unspecified: Secondary | ICD-10-CM

## 2015-02-26 LAB — GLUCOSE, CAPILLARY: GLUCOSE-CAPILLARY: 186 mg/dL — AB (ref 65–99)

## 2015-02-26 MED ORDER — FLUDEOXYGLUCOSE F - 18 (FDG) INJECTION
9.8000 | Freq: Once | INTRAVENOUS | Status: AC | PRN
Start: 2015-02-26 — End: 2015-02-26
  Administered 2015-02-26: 9.8 via INTRAVENOUS

## 2015-02-26 NOTE — Telephone Encounter (Signed)
Pt in ofc today for me to fax his [place] forms to:   F: [phone] P: [phone]   Pt in ofc today for me to fax his [place] forms to: Claim: [id number]  F: [place] SCANN[place]

## 2015-02-27 ENCOUNTER — Emergency Department (HOSPITAL_COMMUNITY): Payer: BLUE CROSS/BLUE SHIELD

## 2015-02-27 ENCOUNTER — Encounter (HOSPITAL_COMMUNITY): Payer: Self-pay

## 2015-02-27 ENCOUNTER — Inpatient Hospital Stay (HOSPITAL_COMMUNITY): Payer: BLUE CROSS/BLUE SHIELD

## 2015-02-27 ENCOUNTER — Inpatient Hospital Stay (HOSPITAL_COMMUNITY)
Admission: EM | Admit: 2015-02-27 | Discharge: 2015-03-09 | DRG: 194 | Disposition: A | Discharge status: Home / Self Care | Payer: BLUE CROSS/BLUE SHIELD | Attending: Internal Medicine | Admitting: Internal Medicine

## 2015-02-27 ENCOUNTER — Telehealth: Payer: Self-pay

## 2015-02-27 ENCOUNTER — Other Ambulatory Visit: Payer: Self-pay

## 2015-02-27 DIAGNOSIS — K2970 Gastritis, unspecified, without bleeding: Secondary | ICD-10-CM | POA: Diagnosis present

## 2015-02-27 DIAGNOSIS — R0600 Dyspnea, unspecified: Secondary | ICD-10-CM | POA: Insufficient documentation

## 2015-02-27 DIAGNOSIS — Z8249 Family history of ischemic heart disease and other diseases of the circulatory system: Secondary | ICD-10-CM

## 2015-02-27 DIAGNOSIS — Z789 Other specified health status: Secondary | ICD-10-CM | POA: Diagnosis not present

## 2015-02-27 DIAGNOSIS — B9681 Helicobacter pylori [H. pylori] as the cause of diseases classified elsewhere: Secondary | ICD-10-CM | POA: Diagnosis present

## 2015-02-27 DIAGNOSIS — M899 Disorder of bone, unspecified: Secondary | ICD-10-CM | POA: Diagnosis present

## 2015-02-27 DIAGNOSIS — Z923 Personal history of irradiation: Secondary | ICD-10-CM | POA: Diagnosis not present

## 2015-02-27 DIAGNOSIS — E875 Hyperkalemia: Secondary | ICD-10-CM | POA: Insufficient documentation

## 2015-02-27 DIAGNOSIS — Z7952 Long term (current) use of systemic steroids: Secondary | ICD-10-CM | POA: Diagnosis not present

## 2015-02-27 DIAGNOSIS — Z91041 Radiographic dye allergy status: Secondary | ICD-10-CM | POA: Diagnosis not present

## 2015-02-27 DIAGNOSIS — D849 Immunodeficiency, unspecified: Secondary | ICD-10-CM | POA: Diagnosis present

## 2015-02-27 DIAGNOSIS — D696 Thrombocytopenia, unspecified: Secondary | ICD-10-CM | POA: Diagnosis not present

## 2015-02-27 DIAGNOSIS — Z79891 Long term (current) use of opiate analgesic: Secondary | ICD-10-CM

## 2015-02-27 DIAGNOSIS — Z9481 Bone marrow transplant status: Secondary | ICD-10-CM

## 2015-02-27 DIAGNOSIS — R9431 Abnormal electrocardiogram [ECG] [EKG]: Secondary | ICD-10-CM | POA: Diagnosis present

## 2015-02-27 DIAGNOSIS — D6481 Anemia due to antineoplastic chemotherapy: Secondary | ICD-10-CM | POA: Diagnosis present

## 2015-02-27 DIAGNOSIS — K219 Gastro-esophageal reflux disease without esophagitis: Secondary | ICD-10-CM | POA: Diagnosis present

## 2015-02-27 DIAGNOSIS — Z791 Long term (current) use of non-steroidal anti-inflammatories (NSAID): Secondary | ICD-10-CM | POA: Diagnosis not present

## 2015-02-27 DIAGNOSIS — G8929 Other chronic pain: Secondary | ICD-10-CM | POA: Diagnosis present

## 2015-02-27 DIAGNOSIS — C9002 Multiple myeloma in relapse: Secondary | ICD-10-CM | POA: Diagnosis present

## 2015-02-27 DIAGNOSIS — G4700 Insomnia, unspecified: Secondary | ICD-10-CM | POA: Diagnosis present

## 2015-02-27 DIAGNOSIS — N179 Acute kidney failure, unspecified: Secondary | ICD-10-CM | POA: Diagnosis present

## 2015-02-27 DIAGNOSIS — D6959 Other secondary thrombocytopenia: Secondary | ICD-10-CM | POA: Diagnosis present

## 2015-02-27 DIAGNOSIS — T380X5A Adverse effect of glucocorticoids and synthetic analogues, initial encounter: Secondary | ICD-10-CM | POA: Diagnosis present

## 2015-02-27 DIAGNOSIS — M898X9 Other specified disorders of bone, unspecified site: Secondary | ICD-10-CM | POA: Diagnosis present

## 2015-02-27 DIAGNOSIS — Z9221 Personal history of antineoplastic chemotherapy: Secondary | ICD-10-CM

## 2015-02-27 DIAGNOSIS — D899 Disorder involving the immune mechanism, unspecified: Secondary | ICD-10-CM

## 2015-02-27 DIAGNOSIS — J189 Pneumonia, unspecified organism: Principal | ICD-10-CM | POA: Diagnosis present

## 2015-02-27 DIAGNOSIS — N189 Chronic kidney disease, unspecified: Secondary | ICD-10-CM | POA: Diagnosis not present

## 2015-02-27 DIAGNOSIS — D691 Qualitative platelet defects: Secondary | ICD-10-CM | POA: Diagnosis present

## 2015-02-27 DIAGNOSIS — R739 Hyperglycemia, unspecified: Secondary | ICD-10-CM | POA: Diagnosis present

## 2015-02-27 DIAGNOSIS — E860 Dehydration: Secondary | ICD-10-CM | POA: Diagnosis present

## 2015-02-27 DIAGNOSIS — R042 Hemoptysis: Secondary | ICD-10-CM | POA: Diagnosis present

## 2015-02-27 DIAGNOSIS — T451X5A Adverse effect of antineoplastic and immunosuppressive drugs, initial encounter: Secondary | ICD-10-CM | POA: Diagnosis present

## 2015-02-27 DIAGNOSIS — R079 Chest pain, unspecified: Secondary | ICD-10-CM | POA: Diagnosis not present

## 2015-02-27 DIAGNOSIS — R1031 Right lower quadrant pain: Secondary | ICD-10-CM | POA: Diagnosis present

## 2015-02-27 DIAGNOSIS — Y95 Nosocomial condition: Secondary | ICD-10-CM | POA: Diagnosis present

## 2015-02-27 DIAGNOSIS — Z87891 Personal history of nicotine dependence: Secondary | ICD-10-CM

## 2015-02-27 DIAGNOSIS — Z9484 Stem cells transplant status: Secondary | ICD-10-CM | POA: Diagnosis not present

## 2015-02-27 DIAGNOSIS — C9000 Multiple myeloma not having achieved remission: Secondary | ICD-10-CM | POA: Diagnosis present

## 2015-02-27 DIAGNOSIS — A048 Other specified bacterial intestinal infections: Secondary | ICD-10-CM

## 2015-02-27 DIAGNOSIS — Z79899 Other long term (current) drug therapy: Secondary | ICD-10-CM | POA: Diagnosis not present

## 2015-02-27 DIAGNOSIS — N183 Chronic kidney disease, stage 3 (moderate): Secondary | ICD-10-CM | POA: Diagnosis present

## 2015-02-27 DIAGNOSIS — R0902 Hypoxemia: Secondary | ICD-10-CM

## 2015-02-27 HISTORY — DX: Gastro-esophageal reflux disease without esophagitis: K21.9

## 2015-02-27 LAB — I-STAT CHEM 8, ED
BUN: 25 mg/dL — AB (ref 6–20)
CHLORIDE: 97 mmol/L — AB (ref 101–111)
Calcium, Ion: 1.59 mmol/L — ABNORMAL HIGH (ref 1.12–1.23)
Creatinine, Ser: 1.4 mg/dL — ABNORMAL HIGH (ref 0.61–1.24)
GLUCOSE: 206 mg/dL — AB (ref 65–99)
HEMATOCRIT: 30 % — AB (ref 39.0–52.0)
HEMOGLOBIN: 10.2 g/dL — AB (ref 13.0–17.0)
Potassium: 3.8 mmol/L (ref 3.5–5.1)
Sodium: 136 mmol/L (ref 135–145)
TCO2: 27 mmol/L (ref 0–100)

## 2015-02-27 LAB — KAPPA/LAMBDA LIGHT CHAINS
Kappa free light chain: 96.8 mg/dL — ABNORMAL HIGH (ref 0.33–1.94)
Kappa:Lambda Ratio: 189.8 — ABNORMAL HIGH (ref 0.26–1.65)
Lambda Free Lght Chn: 0.51 mg/dL — ABNORMAL LOW (ref 0.57–2.63)

## 2015-02-27 LAB — SPEP & IFE WITH QIG
ALPHA-1-GLOBULIN: 0.4 g/dL — AB (ref 0.2–0.3)
Albumin ELP: 4.5 g/dL (ref 3.8–4.8)
Alpha-2-Globulin: 0.7 g/dL (ref 0.5–0.9)
BETA 2: 0.4 g/dL (ref 0.2–0.5)
Beta Globulin: 0.4 g/dL (ref 0.4–0.6)
Gamma Globulin: 0.8 g/dL (ref 0.8–1.7)
IGA: 28 mg/dL — AB (ref 68–379)
IGG (IMMUNOGLOBIN G), SERUM: 962 mg/dL (ref 650–1600)
IgM, Serum: 10 mg/dL — ABNORMAL LOW (ref 41–251)
Total Protein, Serum Electrophoresis: 7.1 g/dL (ref 6.1–8.1)

## 2015-02-27 LAB — PROTIME-INR
INR: 0.93 (ref 0.00–1.49)
PROTHROMBIN TIME: 12.7 s (ref 11.6–15.2)

## 2015-02-27 LAB — LACTATE DEHYDROGENASE: LDH: 235 U/L (ref 94–250)

## 2015-02-27 LAB — CBC WITH DIFFERENTIAL/PLATELET
Basophils Absolute: 0 10*3/uL (ref 0.0–0.1)
Basophils Relative: 0 % (ref 0–1)
EOS ABS: 0 10*3/uL (ref 0.0–0.7)
Eosinophils Relative: 0 % (ref 0–5)
HCT: 30 % — ABNORMAL LOW (ref 39.0–52.0)
Hemoglobin: 9.9 g/dL — ABNORMAL LOW (ref 13.0–17.0)
LYMPHS PCT: 12 % (ref 12–46)
Lymphs Abs: 0.7 10*3/uL (ref 0.7–4.0)
MCH: 31.3 pg (ref 26.0–34.0)
MCHC: 33 g/dL (ref 30.0–36.0)
MCV: 94.9 fL (ref 78.0–100.0)
MONOS PCT: 6 % (ref 3–12)
Monocytes Absolute: 0.4 10*3/uL (ref 0.1–1.0)
Neutro Abs: 5 10*3/uL (ref 1.7–7.7)
Neutrophils Relative %: 82 % — ABNORMAL HIGH (ref 43–77)
Platelets: 70 10*3/uL — ABNORMAL LOW (ref 150–400)
RBC: 3.16 MIL/uL — ABNORMAL LOW (ref 4.22–5.81)
RDW: 15 % (ref 11.5–15.5)
WBC: 6.1 10*3/uL (ref 4.0–10.5)

## 2015-02-27 LAB — APTT: APTT: 23 s — AB (ref 24–37)

## 2015-02-27 LAB — BETA 2 MICROGLOBULIN, SERUM: Beta-2 Microglobulin: 2.76 mg/L — ABNORMAL HIGH (ref <=2.51)

## 2015-02-27 MED ORDER — LIDOCAINE-PRILOCAINE 2.5-2.5 % EX CREA: TOPICAL_CREAM | CUTANEOUS | Status: DC | PRN

## 2015-02-27 MED ORDER — CEFTAZIDIME 2 G IJ SOLR
2.0000 g | Freq: Three times a day (TID) | INTRAMUSCULAR | Status: DC
  Administered 2015-02-27 – 2015-03-05 (×17): 2 g via INTRAVENOUS
  Filled 2015-02-27 (×19): qty 2

## 2015-02-27 MED ORDER — FENTANYL 75 MCG/HR TD PT72
75.0000 ug | MEDICATED_PATCH | TRANSDERMAL | Status: DC
  Administered 2015-03-01 – 2015-03-07 (×3): 75 ug via TRANSDERMAL
  Filled 2015-02-27 (×3): qty 1

## 2015-02-27 MED ORDER — HYDROMORPHONE HCL 4 MG PO TABS
4.0000 mg | ORAL_TABLET | ORAL | Status: DC | PRN
  Administered 2015-02-27: 4 mg via ORAL
  Filled 2015-02-27: qty 1

## 2015-02-27 MED ORDER — LORAZEPAM 0.5 MG PO TABS
0.5000 mg | ORAL_TABLET | Freq: Four times a day (QID) | ORAL | Status: DC | PRN
  Administered 2015-02-28: 0.5 mg via ORAL
  Filled 2015-02-27: qty 1

## 2015-02-27 MED ORDER — BISACODYL 5 MG PO TBEC: 5.0000 mg | DELAYED_RELEASE_TABLET | Freq: Every day | ORAL | Status: DC | PRN

## 2015-02-27 MED ORDER — VANCOMYCIN HCL IN DEXTROSE 1-5 GM/200ML-% IV SOLN
1000.0000 mg | Freq: Two times a day (BID) | INTRAVENOUS | Status: AC
  Administered 2015-02-28 – 2015-03-02 (×6): 1000 mg via INTRAVENOUS
  Filled 2015-02-27 (×6): qty 200

## 2015-02-27 MED ORDER — FAMCICLOVIR 500 MG PO TABS
500.0000 mg | ORAL_TABLET | Freq: Every day | ORAL | Status: DC
  Administered 2015-02-27 – 2015-03-09 (×11): 500 mg via ORAL
  Filled 2015-02-27 (×12): qty 1

## 2015-02-27 MED ORDER — ONDANSETRON HCL 4 MG/2ML IJ SOLN
4.0000 mg | Freq: Once | INTRAMUSCULAR | Status: AC
  Administered 2015-02-27: 4 mg via INTRAVENOUS
  Filled 2015-02-27: qty 2

## 2015-02-27 MED ORDER — SODIUM CHLORIDE 0.9 % IJ SOLN
3.0000 mL | Freq: Two times a day (BID) | INTRAMUSCULAR | Status: DC
  Administered 2015-03-02 – 2015-03-06 (×3): 3 mL via INTRAVENOUS

## 2015-02-27 MED ORDER — ONDANSETRON 4 MG PO TBDP: 8.0000 mg | ORAL_TABLET | Freq: Three times a day (TID) | ORAL | Status: DC | PRN

## 2015-02-27 MED ORDER — HYDROMORPHONE HCL 1 MG/ML IJ SOLN
1.0000 mg | Freq: Once | INTRAMUSCULAR | Status: AC
  Administered 2015-02-27: 1 mg via INTRAVENOUS
  Filled 2015-02-27: qty 1

## 2015-02-27 MED ORDER — TEMAZEPAM 7.5 MG PO CAPS: 22.5000 mg | ORAL_CAPSULE | Freq: Every evening | ORAL | Status: DC | PRN

## 2015-02-27 MED ORDER — GABAPENTIN 300 MG PO CAPS
600.0000 mg | ORAL_CAPSULE | Freq: Three times a day (TID) | ORAL | Status: DC
  Administered 2015-02-27 – 2015-03-09 (×26): 600 mg via ORAL
  Filled 2015-02-27 (×31): qty 2

## 2015-02-27 MED ORDER — ALBUTEROL SULFATE (2.5 MG/3ML) 0.083% IN NEBU
2.5000 mg | INHALATION_SOLUTION | Freq: Two times a day (BID) | RESPIRATORY_TRACT | Status: DC
  Administered 2015-02-28: 2.5 mg via RESPIRATORY_TRACT
  Filled 2015-02-27 (×2): qty 3

## 2015-02-27 MED ORDER — SODIUM CHLORIDE 0.9 % IV SOLN
INTRAVENOUS | Status: DC
  Administered 2015-02-28 – 2015-03-04 (×6): via INTRAVENOUS

## 2015-02-27 MED ORDER — MONTELUKAST SODIUM 10 MG PO TABS
10.0000 mg | ORAL_TABLET | Freq: Every day | ORAL | Status: DC
  Administered 2015-02-27 – 2015-03-08 (×10): 10 mg via ORAL
  Filled 2015-02-27 (×11): qty 1

## 2015-02-27 MED ORDER — IPRATROPIUM-ALBUTEROL 0.5-2.5 (3) MG/3ML IN SOLN
3.0000 mL | RESPIRATORY_TRACT | Status: DC
  Administered 2015-02-27: 3 mL via RESPIRATORY_TRACT
  Filled 2015-02-27: qty 3

## 2015-02-27 MED ORDER — DM-GUAIFENESIN ER 30-600 MG PO TB12
1.0000 | ORAL_TABLET | Freq: Two times a day (BID) | ORAL | Status: DC
  Administered 2015-02-27 – 2015-03-09 (×20): 1 via ORAL
  Filled 2015-02-27 (×21): qty 1

## 2015-02-27 MED ORDER — PANTOPRAZOLE SODIUM 40 MG PO TBEC
40.0000 mg | DELAYED_RELEASE_TABLET | Freq: Every day | ORAL | Status: DC
  Administered 2015-02-27 – 2015-03-01 (×3): 40 mg via ORAL
  Filled 2015-02-27 (×3): qty 1

## 2015-02-27 MED ORDER — ACETAMINOPHEN 325 MG PO TABS
650.0000 mg | ORAL_TABLET | Freq: Four times a day (QID) | ORAL | Status: DC | PRN
  Administered 2015-02-28 – 2015-03-04 (×6): 650 mg via ORAL
  Filled 2015-02-27 (×6): qty 2

## 2015-02-27 MED ORDER — MORPHINE SULFATE 2 MG/ML IJ SOLN
2.0000 mg | INTRAMUSCULAR | Status: DC | PRN
  Administered 2015-02-28 – 2015-03-07 (×10): 2 mg via INTRAVENOUS
  Filled 2015-02-27 (×10): qty 1

## 2015-02-27 MED ORDER — SODIUM CHLORIDE 0.9 % IV BOLUS (SEPSIS)
1000.0000 mL | Freq: Once | INTRAVENOUS | Status: AC
  Administered 2015-02-27: 1000 mL via INTRAVENOUS

## 2015-02-27 MED ORDER — VANCOMYCIN HCL 10 G IV SOLR
2000.0000 mg | INTRAVENOUS | Status: AC
  Administered 2015-02-27: 2000 mg via INTRAVENOUS
  Filled 2015-02-27: qty 2000

## 2015-02-27 NOTE — ED Provider Notes (Signed)
Patient seen/examined in the Emergency Department in conjunction with Midlevel Provider  Patient reports cough and hemoptysis Exam : awake/alert, no distress, coarse BS bilaterally Plan: admit for pneumonia (recently had outpatient therapy with levaquin)    Ripley Fraise, MD 02/27/15 [phone]

## 2015-02-27 NOTE — ED Notes (Signed)
PT WOULD LIKE PORT ACCESSED FOR BLOOD SAMPLES

## 2015-02-27 NOTE — ED Notes (Signed)
Pt request port access for labs. 

## 2015-02-27 NOTE — Telephone Encounter (Signed)
Received call from pt's wife reporting that pt woke up suddenly coughing up bright red blood around 6am. Had multiple episodes over the next 2 hours. States the expectorant is mixed with yellow sputum and "seems to be coming from his chest" vs sinuses. Wife took pt's temp while on the phone and found it to be 99.1 orally. Reports "mild" SOB. Per Dr Marin Olp, pt to go to ED. Verdene Lennert reports understanding. dph

## 2015-02-27 NOTE — H&P (Addendum)
[facility] History and Physical  Cordarrel Krajewski MRN:[id number] DOB: 02/15/1971 DOA: 02/27/2015  Referring physician: ED physician PCP: No PCP Per Patient  Specialists:   Chief Complaint: Hemoptysis  HPI: Mark [name] is a 43 y.o. male with PMH of multiple myeloma (status post chemotherapy and radiation and bone marrow transplant), GERD, CKD-II, who presents with hemoptysis.  Patient reports that he has been having cough, mild shortness of breath and chest pain in the past 2 weeks. He had CAT on 02/09/15 which was negative for PE, but showed pneumonia. He was treated with the levofloxacin for 10 days, last dose was 02/22/15. He states that he feels a little better, but continues to have chest pain. His chest pain is constant, 6 out of 10 in severity, no significant change compared with 2 weeks ago. He also has mild shortness of breath and mild cough with small amount of yellow mucus production.   Patient reports this morning he has been coughing and noted blood in the sputum. He has 5 episodes of hemoptysis with teaspoonful blood in it. He states that his shortness of breath, cough and chest pain have not changed significantly. He has subjective fever, no chills.  He reports that after he started taking new chemotherapy medication, Daratumumab 2 weeks ago, he started having loose stool. He states that he feels better with loose stool since he used be constipated. He is not concerned about his loose stool. He is aware that the medication has a lot of side effect. Some the side effects that he is currently experiencing including chronic headache, generalized fatigue, having nausea, and occasional vomiting. He also endorse pain to his left side of chest, left hip, and right upper leg which has been ongoing. He does not have symptoms of UTI, unilateral weakness, rashes. He contacted his oncologist, Dr. Ennerver this morning in regards to his hemoptysis and was recommended to come to ER for further  evaluation.  In ED, patient was found to have WBC 6.1, temperature normal, temporarily tachycardia (currently heart rate 92), worsening renal function. Chest x-ray showed a new patchy infiltration over right side. Patient is admitted to inpatient for further evaluation treatment.  Where does patient live?   At home   Can patient participate in ADLs?  Yes     Review of Systems:   General: has subjective fevers, no chills, no changes in body weight, has poor appetite, has fatigue HEENT: no blurry vision, hearing changes or sore throat Pulm: has dyspnea, coughing, no wheezing CV: no chest pain, palpitations Abd: Currently no nausea, vomiting, abdominal pain, has diarrhea, no constipation GU: no dysuria, burning on urination, increased urinary frequency, hematuria  Ext: no leg edema Neuro: no unilateral weakness, numbness, or tingling, no vision change or hearing loss Skin: no rash MSK: No muscle spasm, no deformity, no limitation of range of movement in spin Heme: No easy bruising.  Travel history: No recent long distant travel.  Allergy:  Allergies  Allergen Reactions  . Gadolinium Derivatives Nausea And Vomiting    Pt became very nauseous and got sick.     Past Medical History  Diagnosis Date  . History of radiation therapy 02/07/13- 02/26/13    lower L spine, upper sacrum, 35 gray in 14 fractions  . FH: chemotherapy     Dr. Peter Ennever  . Cancer   . Multiple myeloma dx'd 01/2013  . GERD (gastroesophageal reflux disease)     Past Surgical History  Procedure Laterality Date  . Portacath placement    .   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 Kyphoplasty  05/20/14  . Lumbar laminectomy/decompression microdiscectomy N/A 09/25/2014    Procedure: LUMBAR LAMINECTOMY/DECOMPRESSION MICRODISCECTOMY;  Surgeon: Mark Leonard Dumonski, MD;  Location: [facility] OR;  Service: Orthopedics;  Laterality: N/A;  Lumbar 4-5, L5-S1  decompression  . Esophagogastroduodenoscopy N/A  02/06/2015    Procedure: ESOPHAGOGASTRODUODENOSCOPY (EGD);  Surgeon: John Hayes, MD;  Location: [facility] ENDOSCOPY;  Service: Endoscopy;  Laterality: N/A;    Social History:  reports that he quit smoking about 11 years ago. His smoking use included Cigarettes. He started smoking about 26 years ago. He has a 15 pack-year smoking history. He has never used smokeless tobacco. He reports that he does not drink alcohol or use illicit drugs.  Family History:  Family History  Problem Relation Age of Onset  . Diabetic kidney disease Mother   . Hypertension Mother   . Heart attack Mother   . Kidney failure Mother   . Coronary artery disease Mother   . HIV Father      Prior to Admission medications   Medication Sig Start Date End Date Taking? Authorizing Provider  acetaminophen (TYLENOL) 500 MG tablet Take 1,000 mg by mouth every 6 (six) hours as needed for moderate pain or headache (headache).    Yes Historical Provider, MD  bisacodyl (DULCOLAX) 5 MG EC tablet Take 5 mg by mouth daily as needed for moderate constipation (constipation).    Yes Historical Provider, MD  famciclovir (FAMVIR) 500 MG tablet Take 1 tablet (500 mg total) by mouth daily. 02/11/15  Yes Peter R Ennever, MD  fentaNYL (DURAGESIC - DOSED MCG/HR) 75 MCG/HR Place 1 patch (75 mcg total) onto the skin every 3 (three) days. 12/09/14  Yes Peter R Ennever, MD  gabapentin (NEURONTIN) 300 MG capsule Take 2 capsules (600 mg total) by mouth 3 (three) times daily. 11/20/14  Yes Sarah M [place], NP  HYDROmorphone (DILAUDID) 4 MG tablet Take 1-2, IF NEEDED, for pain every 4 hrs for pain Patient taking differently: Take 4-8 mg by mouth every 4 (four) hours as needed for moderate pain or severe pain.  02/25/15  Yes Peter R Ennever, MD  ibuprofen (ADVIL,MOTRIN) 200 MG tablet Take 400 mg by mouth every 6 (six) hours as needed for headache.   Yes Historical Provider, MD  LORazepam (ATIVAN) 0.5 MG tablet Take 1 tablet (0.5 mg total) by mouth every 6 (six)  hours as needed (Nausea or vomiting). 02/18/15  Yes Peter R Ennever, MD  metoCLOPramide (REGLAN) 10 MG tablet Take 1 tablet (10 mg total) by mouth every 6 (six) hours as needed for nausea or vomiting. 01/27/15  Yes [name], MD  montelukast (SINGULAIR) 10 MG tablet Take 1 tablet (10 mg total) by mouth at bedtime. 02/25/15  Yes Peter R Ennever, MD  ondansetron (ZOFRAN ODT) 8 MG disintegrating tablet Take 1 tablet (8 mg total) by mouth every 8 (eight) hours as needed for nausea or vomiting. 02/04/15  Yes Peter R Ennever, MD  pantoprazole (PROTONIX) 40 MG tablet Take 1 tablet (40 mg total) by mouth daily. 02/04/15  Yes Peter R Ennever, MD  prochlorperazine (COMPAZINE) 10 MG tablet Take 1 tablet (10 mg total) by mouth every 6 (six) hours as needed (Nausea or vomiting). 02/18/15  Yes Peter R Ennever, MD  temazepam (RESTORIL) 22.5 MG capsule Take 1 capsule (22.5 mg total) by mouth at bedtime as needed for sleep. 02/04/15  Yes Peter R Ennever, MD  dexamethasone (DECADRON) 4 MG   tablet Take 5 pills once a week with food for 3 weeks in a row then off 1 week. 02/25/15   Peter R Ennever, MD  Ipratropium-Albuterol (COMBIVENT) 20-100 MCG/ACT AERS respimat Inhale 1 puff into the lungs every 6 (six) hours. 02/25/15   Peter R Ennever, MD  lidocaine-prilocaine (EMLA) cream Apply to affected area once 02/18/15   Peter R Ennever, MD  ondansetron (ZOFRAN) 8 MG tablet Take 1 tablet (8 mg total) by mouth 2 (two) times daily. Start the day after chemo for 2 days. Then take as needed for nausea or vomiting. Patient not taking: Reported on 02/27/2015 02/18/15   Peter R Ennever, MD    Physical Exam: Filed Vitals:   02/27/15 1945 02/27/15 2000 02/27/15 2031 02/27/15 2103  BP: 112/67 116/66 123/68   Pulse: 95 92    Temp: 98.5 F (36.9 C)  98.9 F (37.2 C)   TempSrc: Oral  Oral   Resp: 18 11 18   Height:   5' 11" (1.803 m)   Weight:   89.812 kg (198 lb)   SpO2: 95% 94% 100% 95%   General: Not in acute distress HEENT:        Eyes: PERRL, EOMI, no scleral icterus.       ENT: No discharge from the ears and nose, no pharynx injection, no tonsillar enlargement.        Neck: No JVD, no bruit, no mass felt. Heme: No neck lymph node enlargement. Cardiac: S1/S2, RRR, No murmurs, No gallops or rubs. Pulm: No rales, wheezing, rhonchi or rubs. Abd: Soft, nondistended, nontender, no rebound pain, no organomegaly, BS present. Ext: No pitting leg edema bilaterally. 2+DP/PT pulse bilaterally. Musculoskeletal: No joint deformities, No joint redness or warmth, no limitation of ROM in spin. Skin: No rashes.  Neuro: Alert, oriented X3, cranial nerves II-XII grossly intact, muscle strength 5/5 in all extremities, sensation to light touch intact.  Psych: Patient is not psychotic, no suicidal or hemocidal ideation.  Labs on Admission:  Basic Metabolic Panel:  Recent Labs Lab 02/25/15 0829 02/27/15 1740  NA 141 136  K 3.9 3.8  CL 100 97*  CO2 25  --   GLUCOSE 220* 206*  BUN 20 25*  CREATININE 1.3* 1.40*  CALCIUM 12.7*  --    Liver Function Tests:  Recent Labs Lab 02/25/15 0829  AST 34  ALT 47  ALKPHOS 75  BILITOT 1.70*  PROT 7.6   No results for input(s): LIPASE, AMYLASE in the last 168 hours. No results for input(s): AMMONIA in the last 168 hours. CBC:  Recent Labs Lab 02/25/15 0828 02/27/15 1740 02/27/15 1808  WBC 12.4*  --  6.1  NEUTROABS 10.4*  --  5.0  HGB 11.4* 10.2* 9.9*  HCT 32.3* 30.0* 30.0*  MCV 92  --  94.9  PLT 109*  --  70*   Cardiac Enzymes: No results for input(s): CKTOTAL, CKMB, CKMBINDEX, TROPONINI in the last 168 hours.  BNP (last 3 results) No results for input(s): BNP in the last 8760 hours.  ProBNP (last 3 results) No results for input(s): PROBNP in the last 8760 hours.  CBG:  Recent Labs Lab 02/26/15 0816  GLUCAP 186*    Radiological Exams on Admission: Dg Chest 2 View  02/27/2015   CLINICAL DATA:  Multiple myeloma. On chemotherapy. Cough and hemoptysis.  EXAM:  CHEST  2 VIEW  COMPARISON:  02/09/2015 chest radiograph.  02/26/2015 PET-CT.  FINDINGS: Right internal jugular MediPort terminates at the cavoatrial junction. Stable cardiomediastinal   silhouette with normal heart size. No pneumothorax. No pleural effusion. New patchy focal consolidation in the mid to upper right lung, likely in the superior segment of the right lower lobe. Low lung volumes with mild bibasilar atelectasis. Stable mild compression deformities of two lower thoracic vertebral bodies.  IMPRESSION: New patchy focal consolidation in the mid to upper right lung, likely in the superior segment of the right lower lobe. Differential includes pneumonia or alveolar hemorrhage.   Electronically Signed   By: Jason A Poff M.D.   On: 02/27/2015 17:17   Nm Pet Image Restage (ps) Whole Body  02/26/2015   CLINICAL DATA:  Subsequent treatment strategy for multiple myeloma.  EXAM: NUCLEAR MEDICINE PET WHOLE BODY  TECHNIQUE: 9.8 mCi F-18 FDG was injected intravenously. Full-ring PET imaging was performed from the vertex to the feet after the radiotracer. CT data was obtained and used for attenuation correction and anatomic localization.  FASTING BLOOD GLUCOSE:  Value:  186 mg/dl  COMPARISON:  Chest CT 02/09/2015, head CT 02/04/2015, MR pelvis 12/10/2014  FINDINGS: Head/Neck: No hypermetabolic lymph nodes in the neck. There is a focus of asymmetric metabolic activity in the central LEFT temporal cortex (image 16). There is no corresponding lesion on the contrast CT of 02/04/2015 therefore is likely a benign cortical asymmetric activity.  Chest: No hypermetabolic mediastinal or hilar nodes. No suspicious pulmonary nodules on the CT scan.  Abdomen/Pelvis: No abnormal hypermetabolic activity within the liver, pancreas, adrenal glands, or spleen. No hypermetabolic lymph nodes in the abdomen or pelvis.  Skeleton: There is a hypermetabolic soft tissue lesions associated with the LEFT anterior fourth rib. The soft tissue  component measures 2.9 by 4.5 cm with SUV max 7.6.  There is several hypermetabolic foci associated with the LEFT iliac bone. There is a lytic lesion with cortical destruction noted in the iliac crest posteriorly on image 190, series 4 with SUV max equal 8.3.  There is focus of hypermetabolic activity within the marrow space of the RIGHT femoral diaphysis with a subtle soft tissue lesion seen within the medullary space on image 272, series 4. The activity is intense at this site with SUV max equal 8.7.  There are multiple additional lytic lesions throughout the spine without associated metabolic activity.  Extremities: No hypermetabolic activity to suggest metastasis.  IMPRESSION: 1. Several foci hypermetabolic activity within the skeleton consistent with active myeloma lesions. Hypermetabolic lesions include; soft tissue lesion within the LEFT anterior fifth rib, lytic lesion within the LEFT iliac crest, and intra medullary lesion in the mid RIGHT femur. 2. Multiple additional lytic lesions within the spine and pelvis do not have associated metabolic activity. 3. Asymmetric metabolic activity within the LEFT temporal region without corresponding lesion on comparison contrast head CT of 02/04/2015 is likely benign.   Electronically Signed   By: Stewart  Edmunds M.D.   On: 02/26/2015 11:57    EKG: Independently reviewed.  Abnormal findings: LVH, ST elevation in lead 1, V3, the 6, PR segment elevation in aVL   Assessment/Plan Principal Problem:   Hemoptysis Active Problems:   Lytic lesion of bone on x-ray   Multiple myeloma   Acute on chronic kidney failure   Immunocompromised   GERD (gastroesophageal reflux disease)   Hemoptysis: Etiology is not clear, chest x-ray showed new patchy infiltration in right side. Differential diagnosis include alveolar hemorrhage, pneumonia and pulmonary embolism. Patient had CTA on 02/09/15, which was negative for pulmonary embolism. His chest pain has not changed  significantly. He does not have   signs of DVT, clinically less likely to have PE. I ordered stat CT-chest (can not do CAT due to worsening renal function), which showed 5 cm area of alveolar opacity, but still can not differentiate infectious infiltration from pulmonary hemorrhage. Pneumonia is possible, but the patient does not have fever and leukocytosis. Since patient is immunosuppressed, he may have atypical presentation. Given immunosuppressed status, ED started IV antibx to cover HCAP. I think it is reasonable to continue ABx now.  - will admit to tele bed - Continue vancomycin and Fortaz which were ordered by ED. - Mucinex for cough  - DuoNebNeb prn for SOB - Follow up blood culture x2, sputum culture  - IVF: 1L of NS bolus in ED, followed by 100 mL per hour of NS  Multiple myeloma: s/p of bone marrow transplantation. Patient has been followed up by Dr. Ennever, last seen was on 02/11/15. Per Dr. Ennever's note, patient is on Daratumumab with Velcade and Decadron. -will follow up with Dr. Ennever  AoCKD-III: Baseline Cre is 1.1 to 1.3, his Cre is 1.40 on admission. Likely due to prerenal secondary to dehydration and continuation of NSAIDs, Ibuprofen. - IVF: 1L NS and then 100 cc/h - Check FeNa  - US-renal - Follow up renal function by BMP - hold Ibuprofen  GERD (gastroesophageal reflux disease): -Protonix  Abnormal EKG: Patient has diffused ST elevation (Lead I, and V3-V6) and PR segment elevation in aVR. These findings are consistent with possible pericarditis. -will get 2d ehco   DVT ppx: SCD Code Status: Full code Family Communication:   Yes, patient's  wife    at bed side Disposition Plan: Admit to inpatient   Date of Service 02/27/2015    NIU, XILIN [facility] Pager [phone]  If 7PM-7AM, please contact night-coverage [email or web address] Password [email or web address] 02/27/2015, 10:47 PM   

## 2015-02-27 NOTE — Telephone Encounter (Addendum)
-----   Message from [name], MD sent at 02/26/2015  4:56 PM EDT ----- Call and tell him that the PET scan shows that there is active myeloma lesions. This is no surprise. I'm sure this is probably why he has the hip pain. I am confident that the new treatments that he is taking will help with this. Thanks  Above message given to patient's wife via phone. Verbalizes understanding. dph

## 2015-02-27 NOTE — Progress Notes (Signed)
ANTIBIOTIC CONSULT NOTE - INITIAL  Pharmacy Consult for Vancomycin / Ceftazidime Indication: HCAP   Allergies  Allergen Reactions  . Gadolinium Derivatives Nausea And Vomiting    Pt became very nauseous and got sick.     Patient Measurements:   Adjusted Body Weight:   Vital Signs: Temp: 98 F (36.7 C) (07/29 1700) Temp Source: Oral (07/29 1700) BP: 125/70 mmHg (07/29 1700) Pulse Rate: 95 (07/29 1700) Intake/Output from previous day:   Intake/Output from this shift:    Labs:  Recent Labs  02/25/15 0828 02/25/15 0829 02/27/15 1740  WBC 12.4*  --   --   HGB 11.4*  --  10.2*  PLT 109*  --   --   CREATININE  --  1.3* 1.40*   Estimated Creatinine Clearance: 77.5 mL/min (by C-G formula based on Cr of 1.4). No results for input(s): VANCOTROUGH, VANCOPEAK, VANCORANDOM, GENTTROUGH, GENTPEAK, GENTRANDOM, TOBRATROUGH, TOBRAPEAK, TOBRARND, AMIKACINPEAK, AMIKACINTROU, AMIKACIN in the last 72 hours.   Microbiology: No results found for this or any previous visit (from the past 720 hour(s)).  Medical History: Past Medical History  Diagnosis Date  . History of radiation therapy 02/07/13- 02/26/13    lower L spine, upper sacrum, 35 gray in 14 fractions  . FH: chemotherapy     Dr. Burney Gauze  . Cancer   . Multiple myeloma dx'd 01/2013   Assessment: 41 yoM with relapsed multiple myeloma on salvage therapy with darzalex, velcade, and decadron s/p 2 cycles and recently treated outpatient with levaquin for pneumonia presents with hemoptysis and cough.  CXR shows new patchy focal consolidation in mid to upper right lung with differential pneumonia or alveolar hemorrhage. Pharmacy consulted to start vancomycin and ceftazidime for HCAP.  Anti-infectives 7/29 >> Vancomycin  >> 7/29 >> Ceftazidime  >>    Vitals/Labs WBC: Labs pending Tm24h: Afebrile SCr 1.40, CrCl ~77 CG / 70 N  Cultures None ordered yet   Goal of Therapy:  Vancomycin trough level 15-20 mcg/ml  Plan:   Ceftazidime 2g IV q8h Vancomycin 2g IV x 1, then 1g IV q12h F/u vancomycin trough at Css F/u cultures, Tm, renal function, clinical course  Ralene Bathe, PharmD, BCPS 02/27/2015, 6:39 PM  Pager: [phone]

## 2015-02-27 NOTE — ED Notes (Signed)
Pt states since this morning he has been coughing and noted blood in sputum.  Pt has also c/o headache and nausea.  States he has multiple myeloma and notified MD.  Told to come here.  Also having low grade fevers.

## 2015-02-27 NOTE — ED Provider Notes (Signed)
CSN: [id number]     Arrival date & time 02/27/15  1452 History   First MD Initiated Contact with Patient 02/27/15 1611     Chief Complaint  Patient presents with  . Hemoptysis     (Consider location/radiation/quality/duration/timing/severity/associated sxs/prior Treatment) HPI  44 year old male with history of multiple myeloma status post chemotherapy and radiation and bone marrow transplant presenting for evaluation of hemoptysis. Patient report he is currently undergoing chemotherapy with a new drug, Daratumumab, approximately 2 weeks ago. He is aware that the medication has a lot of side effect. Some the side effects that he is currently experiencing including chronic headache, generalized fatigue, having nausea, and occasional vomiting. He was constipated but now having loose stools. Patient reports this morning he has been coughing and noted blood in the sputum. Endorse subjective fever. Also endorse pain to his left side of chest, left hip, and right upper leg which has been ongoing. He had a PE study on July 11 which was negative for PE. He was found to have pneumonia and was treated with Levaquin. He has finished the antibiotic for approximately one week. He did contact his oncologist, Dr. Martha Clan this morning in regards to his hemoptysis and was recommended to come to ER for further evaluation.     Past Medical History  Diagnosis Date  . History of radiation therapy 02/07/13- 02/26/13    lower L spine, upper sacrum, 35 gray in 14 fractions  . FH: chemotherapy     Dr. Burney Gauze  . Cancer   . Multiple myeloma dx'd 01/2013   Past Surgical History  Procedure Laterality Date  . Portacath placement    . Bone marrow transplant  09/12/13    [facility]  . Kyphoplasty  05/20/14  . Lumbar laminectomy/decompression microdiscectomy N/A 09/25/2014    Procedure: LUMBAR LAMINECTOMY/DECOMPRESSION MICRODISCECTOMY;  Surgeon: Sinclair Ship, MD;  Location: [place];  Service:  Orthopedics;  Laterality: N/A;  Lumbar 4-5, L5-S1  decompression  . Esophagogastroduodenoscopy N/A 02/06/2015    Procedure: ESOPHAGOGASTRODUODENOSCOPY (EGD);  Surgeon: Teena Irani, MD;  Location: [facility];  Service: Endoscopy;  Laterality: N/A;   Family History  Problem Relation Age of Onset  . Diabetic kidney disease Mother   . Hypertension Mother   . Heart attack Mother   . Kidney failure Mother   . Coronary artery disease Mother   . HIV Father    History  Substance Use Topics  . Smoking status: Former Smoker -- 1.00 packs/day for 15 years    Types: Cigarettes    Start date: 06/29/1988    Quit date: 06/29/2003  . Smokeless tobacco: Never Used     Comment: quit smoking 10 years ago  . Alcohol Use: No    Review of Systems  All other systems reviewed and are negative.     Allergies  Gadolinium derivatives  Home Medications   Prior to Admission medications   Medication Sig Start Date End Date Taking? Authorizing Provider  acetaminophen (TYLENOL) 500 MG tablet Take 1,000 mg by mouth every 6 (six) hours as needed for moderate pain or headache (headache).     Historical Provider, MD  bisacodyl (DULCOLAX) 5 MG EC tablet Take 5 mg by mouth daily as needed for moderate constipation (constipation).     Historical Provider, MD  dexamethasone (DECADRON) 4 MG tablet Take 5 pills once a week with food for 3 weeks in a row then off 1 week. 02/25/15   [name], MD  famciclovir [facility]) 500  MG tablet Take 1 tablet (500 mg total) by mouth daily. 02/11/15   [name], MD  fentaNYL (DURAGESIC - DOSED MCG/HR) 75 MCG/HR Place 1 patch (75 mcg total) onto the skin every 3 (three) days. 12/09/14   [name], MD  gabapentin (NEURONTIN) 300 MG capsule Take 2 capsules (600 mg total) by mouth 3 (three) times daily. 11/20/14   [name], NP  HYDROmorphone (DILAUDID) 4 MG tablet Take 1-2, IF NEEDED, for pain every 4 hrs for pain 02/25/15   [name], MD  ibuprofen  (ADVIL,MOTRIN) 200 MG tablet Take 400 mg by mouth every 6 (six) hours as needed for headache.    Historical Provider, MD  Ipratropium-Albuterol (COMBIVENT) 20-100 MCG/ACT AERS respimat Inhale 1 puff into the lungs every 6 (six) hours. 02/25/15   [name], MD  lidocaine-prilocaine (EMLA) cream Apply to affected area once 02/18/15   [name], MD  LORazepam (ATIVAN) 0.5 MG tablet Take 1 tablet (0.5 mg total) by mouth every 6 (six) hours as needed (Nausea or vomiting). 02/18/15   [name], MD  metoCLOPramide (REGLAN) 10 MG tablet Take 1 tablet (10 mg total) by mouth every 6 (six) hours as needed for nausea or vomiting. 01/27/15   Shanon Rosser, MD  montelukast (SINGULAIR) 10 MG tablet Take 1 tablet (10 mg total) by mouth at bedtime. 02/25/15   [name], MD  ondansetron (ZOFRAN ODT) 8 MG disintegrating tablet Take 1 tablet (8 mg total) by mouth every 8 (eight) hours as needed for nausea or vomiting. 02/04/15   [name], MD  ondansetron (ZOFRAN) 8 MG tablet Take 1 tablet (8 mg total) by mouth 2 (two) times daily. Start the day after chemo for 2 days. Then take as needed for nausea or vomiting. 02/18/15   [name], MD  pantoprazole (PROTONIX) 40 MG tablet Take 1 tablet (40 mg total) by mouth daily. 02/04/15   [name], MD  prochlorperazine (COMPAZINE) 10 MG tablet Take 1 tablet (10 mg total) by mouth every 6 (six) hours as needed (Nausea or vomiting). 02/18/15   [name], MD  temazepam (RESTORIL) 22.5 MG capsule Take 1 capsule (22.5 mg total) by mouth at bedtime as needed for sleep. 02/04/15   [name], MD   BP 143/86 mmHg  Pulse 114  Temp(Src) 98.6 F (37 C) (Oral)  Resp 18  SpO2 100% Physical Exam  Constitutional: He is oriented to person, place, and time. He appears well-developed and well-nourished. No distress.  African-American male, nontoxic in appearance  HENT:  Head: Atraumatic.  Mouth/Throat: Oropharynx is clear and moist.  Eyes: Conjunctivae  are normal.  Neck: Neck supple.  No nuchal rigidity.  Cardiovascular:  Mild tachycardia without murmur rubs or gallops  Pulmonary/Chest: Effort normal and breath sounds normal. No respiratory distress. He has no wheezes. He exhibits tenderness (Tenderness to left anterior chest on palpation without crepitus or emphysema.).  Abdominal: Soft. There is no tenderness.  Musculoskeletal: He exhibits tenderness (tenderness to left anterior hip and laterally hip with normal range of motion and no gross deformity. Tenderness to right anterior mid thigh on palpation, no crepitus or deformity.). He exhibits no edema.  Neurological: He is alert and oriented to person, place, and time.  Skin: No rash noted.  Psychiatric: He has a normal mood and affect.  Nursing note and vitals reviewed.   ED Course  Procedures (including critical care time)  Patient with history of  multiple myeloma here with headache, left anterior chest pain, left hip pain and right upper thigh pain. He did have a PET scan yesterday that shows evidence of active myeloma in the affected region. He developed hemoptysis today without any significant shortness of breath. He had a normal PE study 2 weeks ago. Plan to obtain chest x-ray, check labs, provide pain control, antinausea medication, and IV fluid. Care discussed with Dr. Christy Gentles.  5:24 PM Chest x-ray shows new patchy focal consolidation in the mid to upper right lung likely in the superior segment of the right lower lung. Differential includes pneumonia or alveolar hemorrhage. Patient was treated for pneumonia with Levaquin several weeks ago but this finding is concerning for pneumonia that failed outpatient treatment.  Pt will be treated for HCAP with Fortaz and Vancomycin. Plan for admission.  Care discussed with Dr. Christy Gentles.  Pt able to ambulate while maintaining O2 >90%  7:27 PM Appreciate consultation from [facility] Dr. Blaine Hamper who agrees to admit pt to telemetry bed under  his care for treatment of HCAP.  Pt made aware of plan and agrees with plan.    Labs Review Labs Reviewed  CBC WITH DIFFERENTIAL/PLATELET - Abnormal; Notable for the following:    RBC 3.16 (*)    Hemoglobin 9.9 (*)    HCT 30.0 (*)    Neutrophils Relative % 82 (*)    All other components within normal limits  I-STAT CHEM 8, ED - Abnormal; Notable for the following:    Chloride 97 (*)    BUN 25 (*)    Creatinine, Ser 1.40 (*)    Glucose, Bld 206 (*)    Calcium, Ion 1.59 (*)    Hemoglobin 10.2 (*)    HCT 30.0 (*)    All other components within normal limits    Imaging Review Dg Chest 2 View  02/27/2015   CLINICAL DATA:  Multiple myeloma. On chemotherapy. Cough and hemoptysis.  EXAM: CHEST  2 VIEW  COMPARISON:  02/09/2015 chest radiograph.  02/26/2015 PET-CT.  FINDINGS: Right internal jugular MediPort terminates at the cavoatrial junction. Stable cardiomediastinal silhouette with normal heart size. No pneumothorax. No pleural effusion. New patchy focal consolidation in the mid to upper right lung, likely in the superior segment of the right lower lobe. Low lung volumes with mild bibasilar atelectasis. Stable mild compression deformities of two lower thoracic vertebral bodies.  IMPRESSION: New patchy focal consolidation in the mid to upper right lung, likely in the superior segment of the right lower lobe. Differential includes pneumonia or alveolar hemorrhage.   Electronically Signed   By: Ilona Sorrel M.D.   On: 02/27/2015 17:17   Nm Pet Image Restage (ps) Whole Body  02/26/2015   CLINICAL DATA:  Subsequent treatment strategy for multiple myeloma.  EXAM: NUCLEAR MEDICINE PET WHOLE BODY  TECHNIQUE: 9.8 mCi F-18 FDG was injected intravenously. Full-ring PET imaging was performed from the vertex to the feet after the radiotracer. CT data was obtained and used for attenuation correction and anatomic localization.  FASTING BLOOD GLUCOSE:  Value:  186 mg/dl  COMPARISON:  Chest CT 02/09/2015, head CT  02/04/2015, MR pelvis 12/10/2014  FINDINGS: Head/Neck: No hypermetabolic lymph nodes in the neck. There is a focus of asymmetric metabolic activity in the central LEFT temporal cortex (image 16). There is no corresponding lesion on the contrast CT of 02/04/2015 therefore is likely a benign cortical asymmetric activity.  Chest: No hypermetabolic mediastinal or hilar nodes. No suspicious pulmonary nodules on the CT scan.  Abdomen/Pelvis: No abnormal hypermetabolic activity within the liver, pancreas, adrenal glands, or spleen. No hypermetabolic lymph nodes in the abdomen or pelvis.  Skeleton: There is a hypermetabolic soft tissue lesions associated with the LEFT anterior fourth rib. The soft tissue component measures 2.9 by 4.5 cm with SUV max 7.6.  There is several hypermetabolic foci associated with the LEFT iliac bone. There is a lytic lesion with cortical destruction noted in the iliac crest posteriorly on image 190, series 4 with SUV max equal 8.3.  There is focus of hypermetabolic activity within the marrow space of the RIGHT femoral diaphysis with a subtle soft tissue lesion seen within the medullary space on image 272, series 4. The activity is intense at this site with SUV max equal 8.7.  There are multiple additional lytic lesions throughout the spine without associated metabolic activity.  Extremities: No hypermetabolic activity to suggest metastasis.  IMPRESSION: 1. Several foci hypermetabolic activity within the skeleton consistent with active myeloma lesions. Hypermetabolic lesions include; soft tissue lesion within the LEFT anterior fifth rib, lytic lesion within the LEFT iliac crest, and intra medullary lesion in the mid RIGHT femur. 2. Multiple additional lytic lesions within the spine and pelvis do not have associated metabolic activity. 3. Asymmetric metabolic activity within the LEFT temporal region without corresponding lesion on comparison contrast head CT of 02/04/2015 is likely benign.    Electronically Signed   By: Suzy Bouchard M.D.   On: 02/26/2015 11:57     EKG Interpretation   Date/Time:  Friday February 27 2015 17:31:26 EDT Ventricular Rate:  98 PR Interval:  170 QRS Duration: 92 QT Interval:  327 QTC Calculation: 417 R Axis:   50 Text Interpretation:  Sinus rhythm Consider left ventricular hypertrophy  ST elevation suggests acute pericarditis No significant change since last  tracing Confirmed by Christy Gentles  MD, [place] ([id number]) on 02/27/2015 5:35:00 PM      MDM   Final diagnoses:  HCAP (healthcare-associated pneumonia)    BP 125/70 mmHg  Pulse 95  Temp(Src) 98 F (36.7 C) (Oral)  Resp 14  SpO2 92%  I have reviewed nursing notes and vital signs. I personally viewed the imaging tests through PACS system and agrees with radiologist's intepretation I reviewed available ER/hospitalization records through the EMR     Domenic Moras, PA-C 02/27/15 1928  Ripley Fraise, MD 02/27/15 2259

## 2015-02-28 ENCOUNTER — Inpatient Hospital Stay (HOSPITAL_COMMUNITY): Payer: BLUE CROSS/BLUE SHIELD

## 2015-02-28 DIAGNOSIS — R079 Chest pain, unspecified: Secondary | ICD-10-CM

## 2015-02-28 LAB — COMPREHENSIVE METABOLIC PANEL
ALBUMIN: 3.7 g/dL (ref 3.5–5.0)
ALK PHOS: 72 U/L (ref 38–126)
ALT: 42 U/L (ref 17–63)
AST: 31 U/L (ref 15–41)
Anion gap: 5 (ref 5–15)
BUN: 21 mg/dL — ABNORMAL HIGH (ref 6–20)
CHLORIDE: 100 mmol/L — AB (ref 101–111)
CO2: 33 mmol/L — ABNORMAL HIGH (ref 22–32)
Calcium: 11.1 mg/dL — ABNORMAL HIGH (ref 8.9–10.3)
Creatinine, Ser: 1.17 mg/dL (ref 0.61–1.24)
GFR calc Af Amer: >60 mL/min (ref >60)
GFR calc non Af Amer: >60 mL/min (ref >60)
GLUCOSE: 169 mg/dL — AB (ref 65–99)
POTASSIUM: 3.9 mmol/L (ref 3.5–5.1)
Sodium: 138 mmol/L (ref 135–145)
Total Bilirubin: 1.8 mg/dL — ABNORMAL HIGH (ref 0.3–1.2)
Total Protein: 6.7 g/dL (ref 6.5–8.1)

## 2015-02-28 LAB — CBC
HCT: 30.4 % — ABNORMAL LOW (ref 39.0–52.0)
Hemoglobin: 10 g/dL — ABNORMAL LOW (ref 13.0–17.0)
MCH: 31.3 pg (ref 26.0–34.0)
MCHC: 32.9 g/dL (ref 30.0–36.0)
MCV: 95.3 fL (ref 78.0–100.0)
Platelets: 71 10*3/uL — ABNORMAL LOW (ref 150–400)
RBC: 3.19 MIL/uL — ABNORMAL LOW (ref 4.22–5.81)
RDW: 14.8 % (ref 11.5–15.5)
WBC: 5.6 10*3/uL (ref 4.0–10.5)

## 2015-02-28 LAB — STREP PNEUMONIAE URINARY ANTIGEN: Strep Pneumo Urinary Antigen: NEGATIVE

## 2015-02-28 LAB — GLUCOSE, CAPILLARY: GLUCOSE-CAPILLARY: 120 mg/dL — AB (ref 65–99)

## 2015-02-28 LAB — CREATININE, URINE, RANDOM: Creatinine, Urine: 59.67 mg/dL

## 2015-02-28 LAB — HIV ANTIBODY (ROUTINE TESTING W REFLEX): HIV Screen 4th Generation wRfx: NONREACTIVE

## 2015-02-28 MED ORDER — TRAZODONE HCL 50 MG PO TABS: 50.0000 mg | ORAL_TABLET | Freq: Every evening | ORAL | Status: DC | PRN

## 2015-02-28 MED ORDER — SODIUM CHLORIDE 0.9 % IJ SOLN
10.0000 mL | INTRAMUSCULAR | Status: DC | PRN
  Administered 2015-03-02 – 2015-03-04 (×3): 10 mL
  Filled 2015-02-28 (×3): qty 40

## 2015-02-28 NOTE — Progress Notes (Signed)
[facility] PROGRESS NOTE  Mark [name] ZRA:[id number] DOB: 12-Nov-1970 DOA: 02/27/2015 PCP: No PCP Per Patient  Brief Summary  Mark [name] is a 43 y.o. male with PMH of multiple myeloma (status post chemotherapy and radiation and bone marrow transplant), GERD, CKD-II, who presented with hemoptysis.    Patient reported that he has been having cough, mild shortness of breath and chest pain in the past 2 weeks. CT angio chest on 02/09/15 was negative for PE, but showed pneumonia. He was treated with the levofloxacin for 10 days, last dose was 02/22/15. He felt a little better, but continues to have 6/10 left sided chest pain.  On the morning of admission, he reported cough with blood in the sputum. He had 5 episodes of hemoptysis with teaspoonful blood in it.  He denied fever and chills.  He contacted his oncologist who recommended to come to ER for further evaluation.  In ED, patient was found to have WBC 6.1, temperature normal, temporarily tachycardia (currently heart rate 92), worsening renal function. Chest x-ray showed a new patchy infiltration over right side and CT chest demonstrated new 5cm alveolar opacity in the posterior segment of the right upper lobe and a smaller patchy alveolar opacity in the LUL laterally.    Assessment/Plan   Hemoptysis:  Differential diagnosis include alveolar hemorrhage, pneumonia and pulmonary embolism. Patient had CTA on 02/09/15, which was negative for pulmonary embolism and his chest pain did not change significantly.  He was recently treated for pneumonia, but this is a markedly fast progression from nothing on PET CT the day prior to pronounced alveolar involvement on the day of admission which may suggests alveolar hemorrhage.  He is on daratumumab and velcade, the latter of which has been known to cause hemoptysis.   -  Tele:  Sinus tach - Continue vancomycin and Fortaz  -  Check procalcitonin - Mucinex for cough  - DuoNebNeb prn for SOB - BCx  pending - sputum culture not obtained yet -  S. pneumo neg -  Legionella pending -  Plt count is 70K, INR is wnl, and bleeding is slowing -  Pulmonary consultation  Multiple myeloma: s/p of bone marrow transplantation. Patient has been followed up by Dr. Marin Olp, last seen was on 02/11/15. Per Dr. Antonieta Pert note, patient is on Daratumumab with Velcade and Decadron. - Dr. Marin Olp aware of admission  AoCKD-III: Baseline Cre is 1.1 to 1.3, his Cre was 1.40 on, resolved with IVF -  D/c renal US  GERD (gastroesophageal reflux disease):  stable -Protonix  Abnormal EKG: Patient has diffused ST elevation (Lead I, and V3-V6) and PR segment elevation in aVR. These findings are consistent with possible pericarditis. - ECHO:  Moderate concentric hypertrophy with normal EF and no wall motion abnl or pericardial effusion  Diet:  regular Access:  port IVF:  yes Proph:  SCDs  Code Status: full Family Communication: patient alone Disposition Plan: pending further monitoring for bacteremia or further hemorrhage   Consultants:  Pulmonology  Procedures:  CT chest  Antibiotics:  vanc 7/29 >  ceftaz 7/29 >   HPI/Subjective:  Continues to have headache, nausea with intermittent vomiting, cough productive of white-yellow sputum that is streaked with bright red blood  Objective: Filed Vitals:   02/27/15 2031 02/27/15 2103 02/28/15 0509 02/28/15 1358  BP: 123/68  110/67 120/69  Pulse:   103 121  Temp: 98.9 F (37.2 C)  98.4 F (36.9 C) 99 F (37.2 C)  TempSrc: Oral  Oral Oral  Resp:  '18  18 18  ' Height: '5\' 11"'  (1.803 m)     Weight: 89.812 kg (198 lb)     SpO2: 100% 95% 96% 96%    Intake/Output Summary (Last 24 hours) at 02/28/15 1456 Last data filed at 02/28/15 0900  Gross per 24 hour  Intake    475 ml  Output      0 ml  Net    475 ml   Filed Weights   02/27/15 2031  Weight: 89.812 kg (198 lb)   Body mass index is 27.63 kg/(m^2).  Exam:   General:  Adult male, No  acute distress  HEENT:  NCAT, MMM  Cardiovascular:  RRR, nl S1, S2 no mrg, 2+ pulses, warm extremities  Respiratory:  CTAB - may be slightly dimininshed at the right mid-back but no focal rales, rhonchi, or wheeze, no increased WOB  Abdomen:   NABS, soft, NT/ND  MSK:   Normal tone and bulk, no LEE  Neuro:  Grossly intact  Data Reviewed: Basic Metabolic Panel:  Recent Labs Lab 02/25/15 0829 02/27/15 1740 02/28/15 0122  NA 141 136 138  K 3.9 3.8 3.9  CL 100 97* 100*  CO2 25  --  33*  GLUCOSE 220* 206* 169*  BUN 20 25* 21*  CREATININE 1.3* 1.40* 1.17  CALCIUM 12.7*  --  11.1*   Liver Function Tests:  Recent Labs Lab 02/25/15 0829 02/28/15 0122  AST 34 31  ALT 47 42  ALKPHOS 75 72  BILITOT 1.70* 1.8*  PROT 7.6 6.7  ALBUMIN  --  3.7   No results for input(s): LIPASE, AMYLASE in the last 168 hours. No results for input(s): AMMONIA in the last 168 hours. CBC:  Recent Labs Lab 02/25/15 0828 02/27/15 1740 02/27/15 1808 02/28/15 0122  WBC 12.4*  --  6.1 5.6  NEUTROABS 10.4*  --  5.0  --   HGB 11.4* 10.2* 9.9* 10.0*  HCT 32.3* 30.0* 30.0* 30.4*  MCV 92  --  94.9 95.3  PLT 109*  --  70* 71*    Recent Results (from the past 240 hour(s))  Culture, blood (routine x 2) Call MD if unable to obtain prior to antibiotics being given     Status: None (Preliminary result)   Collection Time: 02/28/15  1:20 AM  Result Value Ref Range Status   Specimen Description   Final    BLOOD LEFT HAND Performed at [facility]    Special Requests BOTTLES DRAWN AEROBIC ONLY 5CC  Final   Culture PENDING  Incomplete   Report Status PENDING  Incomplete  Culture, blood (routine x 2) Call MD if unable to obtain prior to antibiotics being given     Status: None (Preliminary result)   Collection Time: 02/28/15  1:22 AM  Result Value Ref Range Status   Specimen Description   Final    BLOOD LEFT ANTECUBITAL Performed at [facility]    Special Requests BOTTLES DRAWN  AEROBIC ONLY 5CC  Final   Culture PENDING  Incomplete   Report Status PENDING  Incomplete     Studies: Dg Chest 2 View  02/27/2015   CLINICAL DATA:  Multiple myeloma. On chemotherapy. Cough and hemoptysis.  EXAM: CHEST  2 VIEW  COMPARISON:  02/09/2015 chest radiograph.  02/26/2015 PET-CT.  FINDINGS: Right internal jugular MediPort terminates at the cavoatrial junction. Stable cardiomediastinal silhouette with normal heart size. No pneumothorax. No pleural effusion. New patchy focal consolidation in the mid to upper right lung, likely in  the superior segment of the right lower lobe. Low lung volumes with mild bibasilar atelectasis. Stable mild compression deformities of two lower thoracic vertebral bodies.  IMPRESSION: New patchy focal consolidation in the mid to upper right lung, likely in the superior segment of the right lower lobe. Differential includes pneumonia or alveolar hemorrhage.   Electronically Signed   By: Ilona Sorrel M.D.   On: 02/27/2015 17:17   Ct Chest Wo Contrast  02/27/2015   CLINICAL DATA:  Multiple episodes hemoptysis today.  EXAM: CT CHEST WITHOUT CONTRAST  TECHNIQUE: Multidetector CT imaging of the chest was performed following the standard protocol without IV contrast.  COMPARISON:  Radiographs 02/27/2015, CT 02/09/2015, PET 02/26/2015  FINDINGS: There is alveolar opacity in the posterior segment of the right upper lobe with air bronchograms. This could represent infectious infiltrate or pulmonary hemorrhage. This corresponds to the radiographic abnormality observed earlier. The area involved measures approximately 5 cm in diameter. The airways serving this lung are widely patent. Minimal patchy alveolar opacity is present in the left upper lobe laterally, potentially another area of pulmonary hemorrhage or infectious infiltrate. This is a much smaller focus of abnormality. Both of these abnormalities are new from the PET/CT scan obtained on 02/26/2015. There is unchanged pleural  parenchymal linear densities which likely represent scarring, lower lobe predominant. No significant adenopathy is evident in the mediastinum or hila. There is nonspecific mildly prominent soft tissue in the upper mediastinum between the origins of the left subclavian and left common carotid arteries, unchanged.  There are no effusions.  There are innumerable skeletal lesions consistent with myeloma. There is a soft tissue mass surrounding the anterior portion of the left fourth rib and this is slightly larger than on 02/09/2015, measuring 2.4 x 4.9 cm and previously measuring 2.3 x 4.2 cm.  IMPRESSION: 1. The radiographic abnormality corresponds to a 5 cm area of alveolar opacity with air bronchograms in the posterior segment of the right upper lobe. This could represent infectious infiltrate or pulmonary hemorrhage. It is new from yesterday's PET-CT scan. There is a smaller area of patchy alveolar opacity in the left upper lobe laterally, also new, potentially representing another focus of the same acute process. 2. Soft tissue mass surrounding the anterior left fourth rib has slightly enlarged from 02/09/2015. 3. Innumerable lytic lesions throughout the skeleton consistent with myeloma.   Electronically Signed   By: Andreas Newport M.D.   On: 02/27/2015 22:58    Scheduled Meds: . albuterol  2.5 mg Nebulization BID  . cefTAZidime (FORTAZ)  IV  2 g Intravenous Q8H  . dextromethorphan-guaiFENesin  1 tablet Oral BID  . famciclovir  500 mg Oral Daily  . [START ON 03/01/2015] fentaNYL  75 mcg Transdermal Q72H  . gabapentin  600 mg Oral TID  . montelukast  10 mg Oral QHS  . pantoprazole  40 mg Oral Daily  . sodium chloride  3 mL Intravenous Q12H  . vancomycin  1,000 mg Intravenous Q12H   Continuous Infusions: . sodium chloride 100 mL/hr at 02/28/15 1239    Principal Problem:   Hemoptysis Active Problems:   Lytic lesion of bone on x-ray   Multiple myeloma   Acute on chronic kidney failure    Immunocompromised   GERD (gastroesophageal reflux disease)    Time spent: 30 min    Lorenia Hoston, [facility] Pager [phone]. If 7PM-7AM, please contact night-coverage at [email or web address], password [facility] 02/28/2015, 2:56 PM  LOS: 1 day

## 2015-02-28 NOTE — Progress Notes (Signed)
  Echocardiogram 2D Echocardiogram has been performed.  Lysle Rubens 02/28/2015, 12:06 PM

## 2015-03-01 DIAGNOSIS — A048 Other specified bacterial intestinal infections: Secondary | ICD-10-CM

## 2015-03-01 DIAGNOSIS — B9681 Helicobacter pylori [H. pylori] as the cause of diseases classified elsewhere: Secondary | ICD-10-CM

## 2015-03-01 DIAGNOSIS — J189 Pneumonia, unspecified organism: Principal | ICD-10-CM

## 2015-03-01 DIAGNOSIS — C9000 Multiple myeloma not having achieved remission: Secondary | ICD-10-CM

## 2015-03-01 LAB — URINALYSIS, ROUTINE W REFLEX MICROSCOPIC
BILIRUBIN URINE: NEGATIVE
Glucose, UA: NEGATIVE mg/dL
Hgb urine dipstick: NEGATIVE
Ketones, ur: NEGATIVE mg/dL
Leukocytes, UA: NEGATIVE
NITRITE: NEGATIVE
PH: 7.5 (ref 5.0–8.0)
PROTEIN: 30 mg/dL — AB
Specific Gravity, Urine: 1.013 (ref 1.005–1.030)
UROBILINOGEN UA: 1 mg/dL (ref 0.0–1.0)

## 2015-03-01 LAB — HEPATIC FUNCTION PANEL
ALK PHOS: 76 U/L (ref 38–126)
ALT: 42 U/L (ref 17–63)
AST: 33 U/L (ref 15–41)
Albumin: 3.7 g/dL (ref 3.5–5.0)
BILIRUBIN DIRECT: 0.2 mg/dL (ref 0.1–0.5)
BILIRUBIN INDIRECT: 1.3 mg/dL — AB (ref 0.3–0.9)
BILIRUBIN TOTAL: 1.5 mg/dL — AB (ref 0.3–1.2)
Total Protein: 6.9 g/dL (ref 6.5–8.1)

## 2015-03-01 LAB — RENAL FUNCTION PANEL
ALBUMIN: 3.7 g/dL (ref 3.5–5.0)
Anion gap: 7 (ref 5–15)
BUN: 16 mg/dL (ref 6–20)
CHLORIDE: 99 mmol/L — AB (ref 101–111)
CO2: 30 mmol/L (ref 22–32)
Calcium: 11.4 mg/dL — ABNORMAL HIGH (ref 8.9–10.3)
Creatinine, Ser: 0.94 mg/dL (ref 0.61–1.24)
GFR calc Af Amer: >60 mL/min (ref >60)
Glucose, Bld: 104 mg/dL — ABNORMAL HIGH (ref 65–99)
PHOSPHORUS: 2.4 mg/dL — AB (ref 2.5–4.6)
POTASSIUM: 4.3 mmol/L (ref 3.5–5.1)
Sodium: 136 mmol/L (ref 135–145)

## 2015-03-01 LAB — CBC
HEMATOCRIT: 30.6 % — AB (ref 39.0–52.0)
Hemoglobin: 10.4 g/dL — ABNORMAL LOW (ref 13.0–17.0)
MCH: 31.9 pg (ref 26.0–34.0)
MCHC: 34 g/dL (ref 30.0–36.0)
MCV: 93.9 fL (ref 78.0–100.0)
Platelets: 62 10*3/uL — ABNORMAL LOW (ref 150–400)
RBC: 3.26 MIL/uL — ABNORMAL LOW (ref 4.22–5.81)
RDW: 14.5 % (ref 11.5–15.5)
WBC: 5.1 10*3/uL (ref 4.0–10.5)

## 2015-03-01 LAB — LIPASE, BLOOD: LIPASE: 21 U/L — AB (ref 22–51)

## 2015-03-01 LAB — EXPECTORATED SPUTUM ASSESSMENT W REFEX TO RESP CULTURE

## 2015-03-01 LAB — MAGNESIUM: Magnesium: 2.1 mg/dL (ref 1.7–2.4)

## 2015-03-01 LAB — GLUCOSE, CAPILLARY
GLUCOSE-CAPILLARY: 167 mg/dL — AB (ref 65–99)
Glucose-Capillary: 148 mg/dL — ABNORMAL HIGH (ref 65–99)
Glucose-Capillary: 120 mg/dL — ABNORMAL HIGH (ref 65–99)

## 2015-03-01 LAB — EXPECTORATED SPUTUM ASSESSMENT W GRAM STAIN, RFLX TO RESP C

## 2015-03-01 LAB — URINE MICROSCOPIC-ADD ON

## 2015-03-01 MED ORDER — DEXTROSE 5 % IV SOLN
500.0000 mg | INTRAVENOUS | Status: DC
  Administered 2015-03-01 – 2015-03-04 (×4): 500 mg via INTRAVENOUS
  Filled 2015-03-01 (×5): qty 500

## 2015-03-01 MED ORDER — PANTOPRAZOLE SODIUM 40 MG PO TBEC
40.0000 mg | DELAYED_RELEASE_TABLET | Freq: Two times a day (BID) | ORAL | Status: DC
  Administered 2015-03-01 – 2015-03-09 (×16): 40 mg via ORAL
  Filled 2015-03-01 (×19): qty 1

## 2015-03-01 MED ORDER — INSULIN ASPART 100 UNIT/ML ~~LOC~~ SOLN
0.0000 [IU] | Freq: Three times a day (TID) | SUBCUTANEOUS | Status: DC
  Administered 2015-03-01: 2 [IU] via SUBCUTANEOUS
  Administered 2015-03-02 (×2): 1 [IU] via SUBCUTANEOUS

## 2015-03-01 MED ORDER — SODIUM CHLORIDE 0.9 % IV SOLN
Freq: Once | INTRAVENOUS | Status: AC
  Administered 2015-03-01: 16:00:00 via INTRAVENOUS

## 2015-03-01 MED ORDER — ALBUTEROL SULFATE (2.5 MG/3ML) 0.083% IN NEBU: 2.5000 mg | INHALATION_SOLUTION | Freq: Four times a day (QID) | RESPIRATORY_TRACT | Status: DC | PRN

## 2015-03-01 MED ORDER — SACCHAROMYCES BOULARDII 250 MG PO CAPS
250.0000 mg | ORAL_CAPSULE | Freq: Two times a day (BID) | ORAL | Status: DC
  Administered 2015-03-01 – 2015-03-09 (×17): 250 mg via ORAL
  Filled 2015-03-01 (×18): qty 1

## 2015-03-01 NOTE — Consult Note (Addendum)
[place] [facility] Infect[facility] Disease     Reason for Consult: ? infection    Referring Physician: Dr. Sheran Fava  Principal Problem:   Hemoptysis Active Problems:   Hypercalcemia   Lytic lesion of bone on x-ray   Multiple myeloma   Acute on chronic kidney failure   Immunocompromised   HCAP (healthcare-associated pneumonia)   GERD (gastroesophageal reflux disease)   H. pylori infection   . azithromycin  500 mg Intravenous Q24H  . cefTAZidime (FORTAZ)  IV  2 g Intravenous Q8H  . dextromethorphan-guaiFENesin  1 tablet Oral BID  . famciclovir  500 mg Oral Daily  . fentaNYL  75 mcg Transdermal Q72H  . gabapentin  600 mg Oral TID  . insulin aspart  0-9 Units Subcutaneous TID WC  . montelukast  10 mg Oral QHS  . pantoprazole  40 mg Oral BID AC  . saccharomyces boulardii  250 mg Oral BID  . sodium chloride  3 mL Intravenous Q12H  . vancomycin  1,000 mg Intravenous Q12H    Recommendations: Agree with antibiotics as listed including atypical coverage added Agree with BAL if possible   Assessment: Bilateral upper lobe pneumonia concerning for infection or blood in an immunsuppressed patient.    Dr. Baxter Flattery tomorrow  Antibiotics: Vanco, ceftaz, azithromycin, famciclovir prophylaxis  HPI: Mark [name] is a 44 y.o. male with multiple myeloma with what appears to be worsening disease on therapy who presented 7/29 with hemoptysis.  Has had cough, some SOB.  Was recently treated with levaquin for 10 days and did feel better.  Also loose stool on Daratumumab.  CT chest with area of opacity concerning for infection vs hemorrhage.  No fever, + chills.  No blood in stool.   CT personally reviewed.     Review of Systems: A comprehensive review of systems was negative. constitutional: + malaise, appetite ok.   Past Medical History  Diagnosis Date  . History of radiation therapy 02/07/13- 02/26/13    lower L spine, upper sacrum, 35 gray in 14 fractions  . FH: chemotherapy     Dr. Burney Gauze  . Cancer   . Multiple myeloma dx'd 01/2013  . GERD (gastroesophageal reflux disease)     History  Substance Use Topics  . Smoking status: Former Smoker -- 1.00 packs/day for 15 years    Types: Cigarettes    Start date: 06/29/1988    Quit date: 06/29/2003  . Smokeless tobacco: Never Used     Comment: quit smoking 10 years ago  . Alcohol Use: No    Family History  Problem Relation Age of Onset  . Diabetic kidney disease Mother   . Hypertension Mother   . Heart attack Mother   . Kidney failure Mother   . Coronary artery disease Mother   . HIV Father    Allergies  Allergen Reactions  . Gadolinium Derivatives Nausea And Vomiting    Pt became very nauseous and got sick.     OBJECTIVE: Blood pressure 102/60, pulse 111, temperature 98.7 F (37.1 C), temperature source Oral, resp. rate 18, height '5\' 11"'  (1.803 m), weight 198 lb (89.812 kg), SpO2 98 %. General: awake, alert, nad HEENT: anicteric Skin: no rashes Lungs: CTA B, no crackles noted Cor: RRR without m Abdomen: soft, nt, nd Ext: no edema LAD: no cervical or supraclavicular lad Neuro: moves all extremities  Microbiology: Recent Results (from the past 240 hour(s))  Culture, blood (routine x 2) Call MD if unable to obtain prior to  antibiotics being given     Status: None (Preliminary result)   Collection Time: 02/28/15  1:20 AM  Result Value Ref Range Status   Specimen Description BLOOD LEFT HAND  Final   Special Requests BOTTLES DRAWN AEROBIC ONLY 5CC  Final   Culture   Final    NO GROWTH 1 DAY Performed at [facility]    Report Status PENDING  Incomplete  Culture, blood (routine x 2) Call MD if unable to obtain prior to antibiotics being given     Status: None (Preliminary result)   Collection Time: 02/28/15  1:22 AM  Result Value Ref Range Status   Specimen Description BLOOD LEFT ANTECUBITAL  Final   Special Requests BOTTLES DRAWN AEROBIC ONLY 5CC  Final   Culture   Final    NO GROWTH 1  DAY Performed at [facility]    Report Status PENDING  Incomplete  Culture, sputum-assessment     Status: None   Collection Time: 03/01/15  2:10 PM  Result Value Ref Range Status   Specimen Description SPUTUM  Final   Special Requests NONE  Final   Sputum evaluation   Final    MICROSCOPIC FINDINGS SUGGEST THAT THIS SPECIMEN IS NOT REPRESENTATIVE OF LOWER RESPIRATORY SECRETIONS. PLEASE RECOLLECT. NOTIFIED JOHNSON,A AT [id number] ON [id number] BY HOOKER,B    Report Status 03/01/2015 FINAL  Final    Scharlene Gloss, [place] for Infectious Disease [facility] [email or web address] [phone] pager  [phone] cell 03/01/2015, 5:39 PM

## 2015-03-01 NOTE — Consult Note (Signed)
Name: Mark [name] MRN: [id number] DOB: 31-Oct-1970    ADMISSION DATE:  02/27/2015 CONSULTATION DATE:  03/01/2015  REFERRING MD :  TRH  CHIEF COMPLAINT:   hemoptysis  BRIEF PATIENT DESCRIPTION:  44 year old male with recurrent progressive myeloma kappa Light chain, readmitted with recurrent right and left upper lobe pneumonia with associated hemoptysis with thrombocytopenia. Pulmonary asked to comment  SIGNIFICANT EVENTS  Admission 02/27/2015 for hemoptysis and recurrent pneumonia  STUDIES:  CT scan of chest 02/27/2015 reveals bilateral upper lobe right greater than left infiltrates peribronchial   HISTORY OF PRESENT ILLNESS:   This is a 44 year old African-American male who has an aggressive recurrent relapsed Light chain myeloma.  The patient recently showed recurrence of the myeloma and in order to get the patient back into remission for potential repeat stem cell transplant the patient was begun in the past 2 weeks on a new chemotherapy program: Darzalex/Velcade/Decadron.  Apparently the first agent in this list can cause thrombocytopenia and in fact the patient's platelet count which had been in the 1 20,000 range had fallen to below 100,000 with slow to resolve pneumonia and combination with this the patient began having hemoptysis on 02/27/2015. With this the patient presented to the emergency department. Note the patient had previously been admitted 02/04/2015 with nausea vomiting.   Subsequent to this the patient return to the emergency room on 02/09/2015 and was diagnosed with pneumonia and placed on a 10 day course of Levaquin. Note this patient had a CT angiogram at that visit which did not show pulmonary emboli but showed bilateral upper lobe infiltrates. The patient did have associated chest pain. No cultures were obtained.  Note the chest pain is in the left upper anterior chest and worse with movement and deep breath. The patient has had no other bleeding except for mild rectal  bleeding.  This was associated with a self induced enema. Alsopatient has had a recent upper endoscopy and found to have Helicobacter.  Note this was associated with mild gastritis and related to previous intractable nausea and vomiting.  Patient was first diagnosed 2014 with the myeloma. 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  Patient notes associated dyspnea but no lower extremity edema or pain.   PAST MEDICAL HISTORY :   has a past medical history of History of radiation therapy (02/07/13- 02/26/13); FH: chemotherapy; Cancer; Multiple myeloma (dx'd 01/2013); and GERD (gastroesophageal reflux disease).  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 Prior to Admission medications   Medication Sig Start Date End Date Taking? Authorizing Provider  acetaminophen (TYLENOL) 500 MG tablet Take 1,000 mg by mouth every 6 (six) hours as needed for moderate pain or headache (headache).    Yes Historical Provider, MD  bisacodyl (DULCOLAX) 5 MG EC tablet Take 5 mg by mouth daily as needed for moderate constipation (constipation).    Yes Historical Provider, MD  famciclovir (FAMVIR) 500 MG tablet Take 1 tablet (500 mg total) by mouth daily. 02/11/15  Yes [name], MD  fentaNYL (DURAGESIC - DOSED MCG/HR) 75 MCG/HR Place 1 patch (75 mcg total) onto the skin every 3 (three) days. 12/09/14  Yes [name], MD  gabapentin (NEURONTIN) 300 MG capsule Take 2 capsules (600 mg total) by mouth 3 (three) times daily. 11/20/14  Yes [name], NP  HYDROmorphone (DILAUDID) 4 MG tablet Take 1-2,  IF NEEDED, for pain every 4 hrs for pain Patient taking differently: Take 4-8 mg by mouth every 4 (four) hours as needed for moderate pain or severe pain.  02/25/15  Yes [name], MD  ibuprofen (ADVIL,MOTRIN) 200 MG tablet Take 400 mg by mouth every 6 (six) hours as needed for headache.   Yes Historical Provider, MD  LORazepam (ATIVAN) 0.5 MG tablet Take 1 tablet (0.5 mg total) by mouth every 6 (six) hours as needed (Nausea or vomiting). 02/18/15  Yes [name], MD  metoCLOPramide (REGLAN) 10 MG tablet Take 1 tablet (10 mg total) by mouth every 6 (six) hours as needed for nausea or vomiting. 01/27/15  Yes [name], MD  montelukast (SINGULAIR) 10 MG tablet Take 1 tablet (10 mg total) by mouth at bedtime. 02/25/15  Yes [name], MD  ondansetron (ZOFRAN ODT) 8 MG disintegrating tablet Take 1 tablet (8 mg total) by mouth every 8 (eight) hours as needed for nausea or vomiting. 02/04/15  Yes [name], MD  pantoprazole (PROTONIX) 40 MG tablet Take 1 tablet (40 mg total) by mouth daily. 02/04/15  Yes [name], MD  prochlorperazine (COMPAZINE) 10 MG tablet Take 1 tablet (10 mg total) by mouth every 6 (six) hours as needed (Nausea or vomiting). 02/18/15  Yes [name], MD  temazepam (RESTORIL) 22.5 MG capsule Take 1 capsule (22.5 mg total) by mouth at bedtime as needed for sleep. 02/04/15  Yes [name], MD  dexamethasone (DECADRON) 4 MG tablet Take 5 pills once a week with food for 3 weeks in a row then off 1 week. 02/25/15   [name], MD  Ipratropium-Albuterol (COMBIVENT) 20-100 MCG/ACT AERS respimat Inhale 1 puff into the lungs every 6 (six) hours. 02/25/15   [name], MD  lidocaine-prilocaine (EMLA) cream Apply to affected area once 02/18/15   [name], MD  ondansetron (ZOFRAN) 8 MG tablet Take 1 tablet (8 mg total) by mouth 2 (two) times daily. Start the day after chemo for 2 days. Then take as needed for nausea or vomiting. Patient not taking: Reported on 02/27/2015 02/18/15   [name], MD   Allergies  Allergen Reactions  . Gadolinium Derivatives Nausea And Vomiting    Pt became very nauseous and got sick.      FAMILY HISTORY:  family history includes Coronary artery disease in his mother; Diabetic kidney disease in his mother; HIV in his father; Heart attack in his mother; Hypertension in his mother; Kidney failure in his mother. SOCIAL HISTORY:  reports that he quit smoking about 11 years ago. His smoking use included Cigarettes. He started smoking about 26 years ago. He has a 15 pack-year smoking history. He has never used smokeless tobacco. He reports that he does not drink alcohol or use illicit drugs.  REVIEW OF SYSTEMS:   Pos in bold  Constitutional:  fever, chills, weight loss, malaise/fatigue and diaphoresis.  HENT: Negative for hearing loss, ear pain, nosebleeds, congestion, sore throat, neck pain, tinnitus and ear discharge.   Eyes: Negative for blurred vision, double vision, photophobia, pain, discharge and redness.  Respiratory: , hemoptysis, sputum production, shortness of breath, wheezing and stridor.   Cardiovascular:  chest pain, palpitations, orthopnea, claudication, leg swelling and PND.  Gastrointestinal: Negative for heartburn, nausea, vomiting, abdominal pain, diarrhea, constipation, blood in stool and melena.  Genitourinary: Negative for dysuria, urgency, frequency, hematuria and flank pain.  Musculoskeletal: Negative for myalgias, back pain, joint  pain and falls.  Skin: Negative for itching and rash.  Neurological: Negative for dizziness, tingling, tremors, sensory change, speech change, focal weakness, seizures, loss of consciousness, weakness and headaches.  Endo/Heme/Allergies: Negative for environmental allergies and polydipsia. Does not bruise/bleed easily.  SUBJECTIVE:   VITAL SIGNS: Temp:  [98 F (36.7 C)-99 F (37.2 C)] 98.8 F (37.1 C) (07/31 0512) Pulse Rate:  [97-121] 97 (07/31 0512) Resp:  [18-20] 19 (07/31 0512) BP: (103-120)/(54-69) 104/61 mmHg (07/31 0512) SpO2:  [95 %-98 %] 97 % (07/31 0512)  PHYSICAL EXAMINATION: General:  Ill-appearing African  -American male in no acute distress on room air Neuro:  No focal deficits HEENT:  Moist mucous membranes no lymphadenopathy no thyromegaly Cardiovascular:  Regular rate and rhythm normal S1-S2 no S3 or S4 no murmur rub heave or gallop Lungs:  Clear bilaterally without rales or rhonchi no wheezes Abdomen:  Soft nontender no organomegaly Musculoskeletal:  Full range of motion no joint deformity Skin:  Clear   Recent Labs Lab 02/25/15 0829 02/27/15 1740 02/28/15 0122  NA 141 136 138  K 3.9 3.8 3.9  CL 100 97* 100*  CO2 25  --  33*  BUN 20 25* 21*  CREATININE 1.3* 1.40* 1.17  GLUCOSE 220* 206* 169*    Recent Labs Lab 02/25/15 0828 02/27/15 1740 02/27/15 1808 02/28/15 0122  HGB 11.4* 10.2* 9.9* 10.0*  HCT 32.3* 30.0* 30.0* 30.4*  WBC 12.4*  --  6.1 5.6  PLT 109*  --  70* 71*   All scans and imaging studies in the Epic system are reviewed personally Dg Chest 2 View  02/27/2015   CLINICAL DATA:  Multiple myeloma. On chemotherapy. Cough and hemoptysis.  EXAM: CHEST  2 VIEW  COMPARISON:  02/09/2015 chest radiograph.  02/26/2015 PET-CT.  FINDINGS: Right internal jugular MediPort terminates at the cavoatrial junction. Stable cardiomediastinal silhouette with normal heart size. No pneumothorax. No pleural effusion. New patchy focal consolidation in the mid to upper right lung, likely in the superior segment of the right lower lobe. Low lung volumes with mild bibasilar atelectasis. Stable mild compression deformities of two lower thoracic vertebral bodies.  IMPRESSION: New patchy focal consolidation in the mid to upper right lung, likely in the superior segment of the right lower lobe. Differential includes pneumonia or alveolar hemorrhage.   Electronically Signed   By: Ilona Sorrel M.D.   On: 02/27/2015 17:17   Ct Chest Wo Contrast  02/27/2015   CLINICAL DATA:  Multiple episodes hemoptysis today.  EXAM: CT CHEST WITHOUT CONTRAST  TECHNIQUE: Multidetector CT imaging of the chest was  performed following the standard protocol without IV contrast.  COMPARISON:  Radiographs 02/27/2015, CT 02/09/2015, PET 02/26/2015  FINDINGS: There is alveolar opacity in the posterior segment of the right upper lobe with air bronchograms. This could represent infectious infiltrate or pulmonary hemorrhage. This corresponds to the radiographic abnormality observed earlier. The area involved measures approximately 5 cm in diameter. The airways serving this lung are widely patent. Minimal patchy alveolar opacity is present in the left upper lobe laterally, potentially another area of pulmonary hemorrhage or infectious infiltrate. This is a much smaller focus of abnormality. Both of these abnormalities are new from the PET/CT scan obtained on 02/26/2015. There is unchanged pleural parenchymal linear densities which likely represent scarring, lower lobe predominant. No significant adenopathy is evident in the mediastinum or hila. There is nonspecific mildly prominent soft tissue in the upper mediastinum between the origins of the left subclavian and left common carotid  arteries, unchanged.  There are no effusions.  There are innumerable skeletal lesions consistent with myeloma. There is a soft tissue mass surrounding the anterior portion of the left fourth rib and this is slightly larger than on 02/09/2015, measuring 2.4 x 4.9 cm and previously measuring 2.3 x 4.2 cm.  IMPRESSION: 1. The radiographic abnormality corresponds to a 5 cm area of alveolar opacity with air bronchograms in the posterior segment of the right upper lobe. This could represent infectious infiltrate or pulmonary hemorrhage. It is new from yesterday's PET-CT scan. There is a smaller area of patchy alveolar opacity in the left upper lobe laterally, also new, potentially representing another focus of the same acute process. 2. Soft tissue mass surrounding the anterior left fourth rib has slightly enlarged from 02/09/2015. 3. Innumerable lytic lesions  throughout the skeleton consistent with myeloma.   Electronically Signed   By: Andreas Newport M.D.   On: 02/27/2015 22:58    ASSESSMENT / PLAN:   #1 bilateral upper lobe pneumonia likely atypical in nature with underlying severe immunosuppression with associated hemoptysis exacerbated and likely secondary to thrombocytopenia and platelet dysfunction   I suspect this is from the patient's pneumonia with associated thrombocytopenia induced from chemotherapy and likely some degree of platelet dysfunction from the patient's bone marrow abnormalities from myeloma. I suspect that the patient then had spontaneous hemorrhage into the lungs were areas of pneumonia existed. The patient's platelet count likely may need to be supported with blood products but would defer to hematology in this regard. It would be worth getting hematology oncology to see this patient as a consultation during this admission. This is a very complex case with a very aggressive recurrent myeloma. It is difficult for me to determine how much of this is chemotherapy side effect versus actual underlying disease state. Note this patient is severely immunosuppressed therefore a broader spectrum anti-biotic program should be considered and perhaps infectious disease consultation entertained.  Bronchoscopy could be considered but I would only perform bronchial washings in this setting and I would not give consideration to transbronchial biopsy due to high risk of hemorrhage and death.     #2  hypercalcemia secondary to myeloma:   I would follow this closely and treat accordingly per primary care team and hematology  #3 relapsing kappa  Light chain myeloma :    I would involve hematology in this patient's care  Plan  1. I will add azithromycin to the current anti-biotic program for broader spectrum coverage  2. I will have one of my partners follow this patient in the a.m. for consideration for   Bronchoscopy  3. Consider hematology  consultation  4. Check platelet count daily and give consideration to transfusion support if hemoptysis   recurs and platelet count falls further  Mariel Sleet Beeper  [phone]  Cell  [phone]  If no response or cell goes to voicemail, call beeper [phone]  Pulmonary and [place] Pager: [phone]  03/01/2015, 8:19 AM

## 2015-03-01 NOTE — Progress Notes (Addendum)
MD ordered to transfuse platelet for the patient.RN called the lab if the platelet is available. But lab stated that the platelet ordered was "HLA type" which is not available on weekends and stating they have the random donor unit type. N.P. Was consulted but stated he dont know too and if we could wait in the morning. Unable to reach the MD who ordered the platelet so order will be not be given. RN will follow up with the nurse in the morning.

## 2015-03-01 NOTE — Progress Notes (Signed)
[facility] PROGRESS NOTE  Mark [name] QRF:[id number] DOB: Aug 30, 1970 DOA: 02/27/2015 PCP: No PCP Per Patient  Brief Summary  Mark [name] is a 44 y.o. male with PMH of multiple myeloma (status post chemotherapy and radiation and bone marrow transplant), GERD, CKD-II, who presented with hemoptysis.    Patient reported that he has been having cough, mild shortness of breath and chest pain in the past 2 weeks. CT angio chest on 02/09/15 was negative for PE, but showed pneumonia. He was treated with the levofloxacin for 10 days, last dose was 02/22/15. He felt a little better, but continues to have 6/10 left sided chest pain.  On the morning of admission, he reported cough with blood in the sputum. He had 5 episodes of hemoptysis with teaspoonful blood in it.  He denied fever and chills.  He contacted his oncologist who recommended to come to ER for further evaluation.  In ED, patient was found to have WBC 6.1, temperature normal, temporarily tachycardia (currently heart rate 92), worsening renal function. Chest x-ray showed a new patchy infiltration over right side and CT chest demonstrated new 5cm alveolar opacity in the posterior segment of the right upper lobe and a smaller patchy alveolar opacity in the LUL laterally.    Assessment/Plan  Hemoptysis:  Differential diagnosis include alveolar hemorrhage, pneumonia.  Pulmonary embolism less likely since CT angio chest was negative a few weeks ago.  He is on daratumumab and velcade, the latter of which has been known to cause hemoptysis.  Treating for pneumonia.  He is stable on room air so no steroids at this time.   -  Continue vancomycin and Fortaz  -  Azithromycin added by pulmonology 7/31 -  BCx NGTD -  sputum culture not obtained yet -  S. pneumo neg -  Legionella pending -  Plt count is stable near 70K, INR is wnl, and bleeding is slowing -  Appreciate Pulmonary assistance -  Possible bronchoscopy this coming week for BAL but not  biopsy -  ID consult -  IgG 962 (wnl), IgA 28 (L), IgM 10 (L) on 7/27  -  If determined to be infectious, recommend IVIG infusion  Kappa IgG light chain multiple myeloma s/p bone marrow transplantation 2015 at [facility], with relapse.  Patient is on Daratumumab with Velcade and Decadron.  Last SPEP/IFE demonstrated on 7/27 demonstrated increasing kappa free light chain and rapidly increasing kappa:lambda ratio, suggesting he is no longer responding to current treatments -  Dr. Marin Olp aware of admission -  Continue famiciclovir prophylaxis -  Bone marrow biopsy on 7/6 demonstrated 7% plasma cells, Beta-2 microglobulin is stable  AoCKD-III: Baseline Cre is 1.1 to 1.3, his Cre was 1.40 on, resolved with IVF  RLQ pain, may be gas pains or hypercalcemia, but if not improving, will do CT abd/pelvis.  Ddx included appendicitis and could be at risk for typhlitis even though WBC count is normal due to his MM -  LFTs, lipase, and UA -  Consider CT abd/pelvis if not improving  GERD (gastroesophageal reflux disease) due to H. Pylori based on EGD biopsy from 7/8 - increase protonix to BID - Start triple therapy at discharge:  PPI, amox 1gm daily, and clarithromycin 559m BID x 14 days  Abnormal EKG: Patient has diffused ST elevation (Lead I, and V3-V6) and PR segment elevation in aVR. These findings are consistent with possible pericarditis. - ECHO:  Moderate concentric hypertrophy with normal EF and no wall motion abnl or pericardial effusion  Normocytic  anemia, hemoglobin stable, near baseline of 10-69m/dl due MM and chemotherapy -  Adequate iron levels on last bone marrow biopsy -  F/u hgb  Thrombocytopenia, with hemoptysis, may be due to marrow suppression from chemotherapy or further progression of malignancy, stable -  Transfuse platelets today  Hypercalcemia due to MM and dehydration, being treated with zometa, last dose 7/27 -  Not taking any calcium or vitamin D supplements -  Continue  IVF  Hyperglycemia -  Start SSI -  A1c  Diet:  regular Access:  port IVF:  yes Proph:  SCDs  Code Status: full Family Communication: patient and his wife Disposition Plan: pending further monitoring for bacteremia or further hemorrhage   Consultants:  Pulmonology  ID  Oncology:  Spoke with Dr. PWhitney Museby phone, Dr. EMarin Olpwill be back tomorrow  Procedures:  CT chest  Antibiotics:  vanc 7/29 >  ceftaz 7/29 >   Azithromycin 7/31 >  HPI/Subjective:  Cough and hemoptysis have improved.    Objective: Filed Vitals:   02/28/15 1358 02/28/15 2019 02/28/15 2050 03/01/15 0512  BP: 120/69  103/54 104/61  Pulse: 121  107 97  Temp: 99 F (37.2 C)  98 F (36.7 C) 98.8 F (37.1 C)  TempSrc: Oral  Oral Oral  Resp: '18  20 19  ' Height:      Weight:      SpO2: 96% 98% 95% 97%    Intake/Output Summary (Last 24 hours) at 03/01/15 1305 Last data filed at 02/28/15 2300  Gross per 24 hour  Intake 2713.75 ml  Output    300 ml  Net 2413.75 ml   Filed Weights   02/27/15 2031  Weight: 89.812 kg (198 lb)   Body mass index is 27.63 kg/(m^2).  Exam:   General:  Adult male, No acute distress  HEENT:  NCAT, MMM  Cardiovascular:  RRR, nl S1, S2 no mrg, 2+ pulses, warm extremities  Respiratory:  CTAB   Abdomen:   NABS, soft, ND, mild TTP in the RLQ  MSK:   Normal tone and bulk, no LEE  Neuro:  Grossly intact  Data Reviewed: Basic Metabolic Panel:  Recent Labs Lab 02/25/15 0829 02/27/15 1740 02/28/15 0122  NA 141 136 138  K 3.9 3.8 3.9  CL 100 97* 100*  CO2 25  --  33*  GLUCOSE 220* 206* 169*  BUN 20 25* 21*  CREATININE 1.3* 1.40* 1.17  CALCIUM 12.7*  --  11.1*   Liver Function Tests:  Recent Labs Lab 02/25/15 0829 02/28/15 0122  AST 34 31  ALT 47 42  ALKPHOS 75 72  BILITOT 1.70* 1.8*  PROT 7.6 6.7  ALBUMIN  --  3.7   No results for input(s): LIPASE, AMYLASE in the last 168 hours. No results for input(s): AMMONIA in the last 168  hours. CBC:  Recent Labs Lab 02/25/15 0828 02/27/15 1740 02/27/15 1808 02/28/15 0122  WBC 12.4*  --  6.1 5.6  NEUTROABS 10.4*  --  5.0  --   HGB 11.4* 10.2* 9.9* 10.0*  HCT 32.3* 30.0* 30.0* 30.4*  MCV 92  --  94.9 95.3  PLT 109*  --  70* 71*    Recent Results (from the past 240 hour(s))  Culture, blood (routine x 2) Call MD if unable to obtain prior to antibiotics being given     Status: None (Preliminary result)   Collection Time: 02/28/15  1:20 AM  Result Value Ref Range Status   Specimen Description BLOOD LEFT HAND  Final   Special Requests BOTTLES DRAWN AEROBIC ONLY 5CC  Final   Culture   Final    NO GROWTH 1 DAY Performed at [facility]    Report Status PENDING  Incomplete  Culture, blood (routine x 2) Call MD if unable to obtain prior to antibiotics being given     Status: None (Preliminary result)   Collection Time: 02/28/15  1:22 AM  Result Value Ref Range Status   Specimen Description BLOOD LEFT ANTECUBITAL  Final   Special Requests BOTTLES DRAWN AEROBIC ONLY 5CC  Final   Culture   Final    NO GROWTH 1 DAY Performed at [facility]    Report Status PENDING  Incomplete     Studies: Dg Chest 2 View  02/27/2015   CLINICAL DATA:  Multiple myeloma. On chemotherapy. Cough and hemoptysis.  EXAM: CHEST  2 VIEW  COMPARISON:  02/09/2015 chest radiograph.  02/26/2015 PET-CT.  FINDINGS: Right internal jugular MediPort terminates at the cavoatrial junction. Stable cardiomediastinal silhouette with normal heart size. No pneumothorax. No pleural effusion. New patchy focal consolidation in the mid to upper right lung, likely in the superior segment of the right lower lobe. Low lung volumes with mild bibasilar atelectasis. Stable mild compression deformities of two lower thoracic vertebral bodies.  IMPRESSION: New patchy focal consolidation in the mid to upper right lung, likely in the superior segment of the right lower lobe. Differential includes pneumonia or  alveolar hemorrhage.   Electronically Signed   By: Ilona Sorrel M.D.   On: 02/27/2015 17:17   Ct Chest Wo Contrast  02/27/2015   CLINICAL DATA:  Multiple episodes hemoptysis today.  EXAM: CT CHEST WITHOUT CONTRAST  TECHNIQUE: Multidetector CT imaging of the chest was performed following the standard protocol without IV contrast.  COMPARISON:  Radiographs 02/27/2015, CT 02/09/2015, PET 02/26/2015  FINDINGS: There is alveolar opacity in the posterior segment of the right upper lobe with air bronchograms. This could represent infectious infiltrate or pulmonary hemorrhage. This corresponds to the radiographic abnormality observed earlier. The area involved measures approximately 5 cm in diameter. The airways serving this lung are widely patent. Minimal patchy alveolar opacity is present in the left upper lobe laterally, potentially another area of pulmonary hemorrhage or infectious infiltrate. This is a much smaller focus of abnormality. Both of these abnormalities are new from the PET/CT scan obtained on 02/26/2015. There is unchanged pleural parenchymal linear densities which likely represent scarring, lower lobe predominant. No significant adenopathy is evident in the mediastinum or hila. There is nonspecific mildly prominent soft tissue in the upper mediastinum between the origins of the left subclavian and left common carotid arteries, unchanged.  There are no effusions.  There are innumerable skeletal lesions consistent with myeloma. There is a soft tissue mass surrounding the anterior portion of the left fourth rib and this is slightly larger than on 02/09/2015, measuring 2.4 x 4.9 cm and previously measuring 2.3 x 4.2 cm.  IMPRESSION: 1. The radiographic abnormality corresponds to a 5 cm area of alveolar opacity with air bronchograms in the posterior segment of the right upper lobe. This could represent infectious infiltrate or pulmonary hemorrhage. It is new from yesterday's PET-CT scan. There is a smaller  area of patchy alveolar opacity in the left upper lobe laterally, also new, potentially representing another focus of the same acute process. 2. Soft tissue mass surrounding the anterior left fourth rib has slightly enlarged from 02/09/2015. 3. Innumerable lytic lesions throughout the skeleton consistent with  myeloma.   Electronically Signed   By: Andreas Newport M.D.   On: 02/27/2015 22:58    Scheduled Meds: . azithromycin  500 mg Intravenous Q24H  . cefTAZidime (FORTAZ)  IV  2 g Intravenous Q8H  . dextromethorphan-guaiFENesin  1 tablet Oral BID  . famciclovir  500 mg Oral Daily  . fentaNYL  75 mcg Transdermal Q72H  . gabapentin  600 mg Oral TID  . montelukast  10 mg Oral QHS  . pantoprazole  40 mg Oral Daily  . sodium chloride  3 mL Intravenous Q12H  . vancomycin  1,000 mg Intravenous Q12H   Continuous Infusions: . sodium chloride 75 mL/hr at 03/01/15 0135    Principal Problem:   Hemoptysis Active Problems:   Hypercalcemia   Lytic lesion of bone on x-ray   Multiple myeloma   Acute on chronic kidney failure   Immunocompromised   HCAP (healthcare-associated pneumonia)   GERD (gastroesophageal reflux disease)    Time spent: 30 min    Mark [name], [facility] Pager [phone]. If 7PM-7AM, please contact night-coverage at [email or web address], password [facility] 03/01/2015, 1:05 PM  LOS: 2 days

## 2015-03-01 NOTE — Progress Notes (Signed)
Spoke to Blood bank, plts not ready/in yet

## 2015-03-02 ENCOUNTER — Ambulatory Visit: Payer: BLUE CROSS/BLUE SHIELD

## 2015-03-02 ENCOUNTER — Other Ambulatory Visit: Payer: Self-pay

## 2015-03-02 ENCOUNTER — Ambulatory Visit: Payer: BLUE CROSS/BLUE SHIELD | Admitting: Hematology & Oncology

## 2015-03-02 ENCOUNTER — Inpatient Hospital Stay (HOSPITAL_COMMUNITY): Payer: BLUE CROSS/BLUE SHIELD

## 2015-03-02 ENCOUNTER — Other Ambulatory Visit: Payer: BLUE CROSS/BLUE SHIELD

## 2015-03-02 DIAGNOSIS — C9002 Multiple myeloma in relapse: Secondary | ICD-10-CM

## 2015-03-02 DIAGNOSIS — D696 Thrombocytopenia, unspecified: Secondary | ICD-10-CM

## 2015-03-02 DIAGNOSIS — R042 Hemoptysis: Secondary | ICD-10-CM

## 2015-03-02 LAB — CBC WITH DIFFERENTIAL/PLATELET
BASOS ABS: 0 10*3/uL (ref 0.0–0.1)
Basophils Relative: 0 % (ref 0–1)
EOS PCT: 3 % (ref 0–5)
Eosinophils Absolute: 0.1 10*3/uL (ref 0.0–0.7)
HEMATOCRIT: 30.4 % — AB (ref 39.0–52.0)
Hemoglobin: 10.4 g/dL — ABNORMAL LOW (ref 13.0–17.0)
Lymphocytes Relative: 13 % (ref 12–46)
Lymphs Abs: 0.6 10*3/uL — ABNORMAL LOW (ref 0.7–4.0)
MCH: 32.2 pg (ref 26.0–34.0)
MCHC: 34.2 g/dL (ref 30.0–36.0)
MCV: 94.1 fL (ref 78.0–100.0)
MONOS PCT: 8 % (ref 3–12)
Monocytes Absolute: 0.4 10*3/uL (ref 0.1–1.0)
NEUTROS ABS: 3.5 10*3/uL (ref 1.7–7.7)
Neutrophils Relative %: 76 % (ref 43–77)
Platelets: 64 10*3/uL — ABNORMAL LOW (ref 150–400)
RBC: 3.23 MIL/uL — AB (ref 4.22–5.81)
RDW: 14.7 % (ref 11.5–15.5)
WBC: 4.6 10*3/uL (ref 4.0–10.5)

## 2015-03-02 LAB — LEGIONELLA ANTIGEN, URINE

## 2015-03-02 LAB — COMPREHENSIVE METABOLIC PANEL
ALBUMIN: 3.7 g/dL (ref 3.5–5.0)
ALK PHOS: 75 U/L (ref 38–126)
ALT: 39 U/L (ref 17–63)
AST: 32 U/L (ref 15–41)
Anion gap: 8 (ref 5–15)
BUN: 16 mg/dL (ref 6–20)
CHLORIDE: 101 mmol/L (ref 101–111)
CO2: 28 mmol/L (ref 22–32)
Calcium: 11.7 mg/dL — ABNORMAL HIGH (ref 8.9–10.3)
Creatinine, Ser: 1.15 mg/dL (ref 0.61–1.24)
GFR calc non Af Amer: >60 mL/min (ref >60)
GLUCOSE: 162 mg/dL — AB (ref 65–99)
Potassium: 4.6 mmol/L (ref 3.5–5.1)
Sodium: 137 mmol/L (ref 135–145)
Total Bilirubin: 1.2 mg/dL (ref 0.3–1.2)
Total Protein: 6.8 g/dL (ref 6.5–8.1)

## 2015-03-02 LAB — GLUCOSE, CAPILLARY
Glucose-Capillary: 121 mg/dL — ABNORMAL HIGH (ref 65–99)
Glucose-Capillary: 131 mg/dL — ABNORMAL HIGH (ref 65–99)
Glucose-Capillary: 109 mg/dL — ABNORMAL HIGH (ref 65–99)

## 2015-03-02 LAB — TYPE AND SCREEN
ABO/RH(D): B POS
Antibody Screen: POSITIVE
DAT, IgG: NEGATIVE
UNIT DIVISION: 0
Unit division: 0

## 2015-03-02 LAB — PREPARE PLATELET PHERESIS: UNIT DIVISION: 0

## 2015-03-02 LAB — HEMOGLOBIN A1C
HEMOGLOBIN A1C: 6 % — AB (ref 4.8–5.6)
MEAN PLASMA GLUCOSE: 126 mg/dL

## 2015-03-02 LAB — VANCOMYCIN, TROUGH: VANCOMYCIN TR: 10 ug/mL (ref 10.0–20.0)

## 2015-03-02 MED ORDER — VANCOMYCIN HCL IN DEXTROSE 1-5 GM/200ML-% IV SOLN
1000.0000 mg | Freq: Three times a day (TID) | INTRAVENOUS | Status: DC
  Administered 2015-03-03 – 2015-03-05 (×7): 1000 mg via INTRAVENOUS
  Filled 2015-03-02 (×8): qty 200

## 2015-03-02 MED ORDER — SODIUM CHLORIDE 0.9 % IV SOLN
Freq: Once | INTRAVENOUS | Status: AC
  Administered 2015-03-03: 03:00:00 via INTRAVENOUS

## 2015-03-02 MED ORDER — CALCITONIN (SALMON) 200 UNIT/ML IJ SOLN
540.0000 [IU] | Freq: Three times a day (TID) | INTRAMUSCULAR | Status: AC
  Administered 2015-03-02 (×3): 540 [IU] via SUBCUTANEOUS
  Filled 2015-03-02 (×4): qty 2.7

## 2015-03-02 MED ORDER — ONDANSETRON HCL 4 MG/2ML IJ SOLN
4.0000 mg | Freq: Four times a day (QID) | INTRAMUSCULAR | Status: DC | PRN
  Administered 2015-03-02 – 2015-03-07 (×4): 4 mg via INTRAVENOUS
  Filled 2015-03-02 (×4): qty 2

## 2015-03-02 NOTE — Progress Notes (Signed)
Pt complaining of chest pain after walking back from the bathroom.  VSS.  EKG done.  MD made aware.

## 2015-03-02 NOTE — Consult Note (Signed)
Referral MD  Reason for Referral: Hemoptysis. Recurrent Kappa Lightchain myeloma.   Chief Complaint  Patient presents with  . Hemoptysis  : I'm here because I was coughing up blood.  HPI: Mr. Chapple is well-known to me. He's a 44 year old African-American male. He has relapsed Kappa Lightchain myeloma. He ultimately underwent bone marrow transplant for his initial myeloma diagnosis back in February 2015. He now hasn't found have relapsed disease. He has been started on chemotherapy with the monoclonal antibody Daratumumab along with Velcade and Decadron.  He's had one cycle today.  Hypercalcemia has been a problem. His calcium, when he first presented with myeloma was incredibly high.  He developed acute hemoptysis on Friday the 29th. He subsequently was admitted. A CT scan showed an area of consolidation in the left lung.  He is seen by critical care. He's been seen by infectious disease. He is on IV antibiotics.  His blood work when he came in showed a mild thrombocytopenia. His hemoglobin is slightly anemic but this has been relatively stable.  He says that his hemoptysis is getting much better.  He has headache. I think this probably is from his treatments. He has chronic pain because of myeloma in his bones. He recently had a PET scan which showed multiple areas of myelomatous involvement.  He's not had any obvious fever. His appetite is marginal. He has had some weight loss.  He's had no bleeding otherwise. He's had some slight loose stools.    Past Medical History  Diagnosis Date  . History of radiation therapy 02/07/13- 02/26/13    lower L spine, upper sacrum, 35 gray in 14 fractions  . FH: chemotherapy     Dr. Burney Gauze  . Cancer   . Multiple myeloma dx'd 01/2013  . GERD (gastroesophageal reflux disease)   :  Past Surgical History  Procedure Laterality Date  . Portacath placement    . Bone marrow transplant  09/12/13    [facility]  . Kyphoplasty  05/20/14  .  Lumbar laminectomy/decompression microdiscectomy N/A 09/25/2014    Procedure: LUMBAR LAMINECTOMY/DECOMPRESSION MICRODISCECTOMY;  Surgeon: Sinclair Ship, MD;  Location: [place] [facility];  Service: Orthopedics;  Laterality: N/A;  Lumbar 4-5, L5-S1  decompression  . Esophagogastroduodenoscopy N/A 02/06/2015    Procedure: ESOPHAGOGASTRODUODENOSCOPY (EGD);  Surgeon: Teena Irani, MD;  Location: [facility];  Service: Endoscopy;  Laterality: N/A;  :   Current facility-administered medications:  .  0.9 %  sodium chloride infusion, , Intravenous, Continuous, Janece Canterbury, MD, Last Rate: 75 mL/hr at 03/01/15 1800 .  acetaminophen (TYLENOL) tablet 650 mg, 650 mg, Oral, Q6H PRN, Ivor Costa, MD, 650 mg at 03/01/15 1605 .  albuterol (PROVENTIL) (2.5 MG/3ML) 0.083% nebulizer solution 2.5 mg, 2.5 mg, Nebulization, Q6H PRN, Janece Canterbury, MD .  azithromycin (ZITHROMAX) 500 mg in dextrose 5 % 250 mL IVPB, 500 mg, Intravenous, Q24H, Elsie Stain, MD, 500 mg at 03/01/15 2025 .  bisacodyl (DULCOLAX) EC tablet 5 mg, 5 mg, Oral, Daily PRN, Ivor Costa, MD .  cefTAZidime (FORTAZ) 2 g in dextrose 5 % 50 mL IVPB, 2 g, Intravenous, Q8H, Domenic Moras, PA-C, Last Rate: 100 mL/hr at 03/02/15 0300, 2 g at 03/02/15 0300 .  dextromethorphan-guaiFENesin (MUCINEX DM) 30-600 MG per 12 hr tablet 1 tablet, 1 tablet, Oral, BID, Ivor Costa, MD, 1 tablet at 03/01/15 2108 .  famciclovir [facility]) tablet 500 mg, 500 mg, Oral, Daily, Ivor Costa, MD, 500 mg at 03/01/15 0920 .  fentaNYL (DURAGESIC - dosed mcg/hr)  75 mcg, 75 mcg, Transdermal, Q72H, Ivor Costa, MD, 75 mcg at 03/01/15 0920 .  gabapentin (NEURONTIN) capsule 600 mg, 600 mg, Oral, TID, Ivor Costa, MD, 600 mg at 03/01/15 2107 .  HYDROmorphone (DILAUDID) tablet 4-8 mg, 4-8 mg, Oral, Q4H PRN, Ivor Costa, MD, 4 mg at 02/27/15 2055 .  insulin aspart (novoLOG) injection 0-9 Units, 0-9 Units, Subcutaneous, TID WC, Janece Canterbury, MD, 2 Units at 03/01/15 1715 .  lidocaine-prilocaine (EMLA)  cream, , Topical, PRN, Ivor Costa, MD .  LORazepam (ATIVAN) tablet 0.5 mg, 0.5 mg, Oral, Q6H PRN, Ivor Costa, MD, 0.5 mg at 02/28/15 1234 .  montelukast (SINGULAIR) tablet 10 mg, 10 mg, Oral, QHS, Ivor Costa, MD, 10 mg at 03/01/15 2108 .  morphine 2 MG/ML injection 2 mg, 2 mg, Intravenous, Q4H PRN, Ivor Costa, MD, 2 mg at 03/01/15 2107 .  ondansetron (ZOFRAN-ODT) disintegrating tablet 8 mg, 8 mg, Oral, Q8H PRN, Ivor Costa, MD .  pantoprazole (PROTONIX) EC tablet 40 mg, 40 mg, Oral, BID AC, Janece Canterbury, MD, 40 mg at 03/01/15 1605 .  saccharomyces boulardii (FLORASTOR) capsule 250 mg, 250 mg, Oral, BID, Janece Canterbury, MD, 250 mg at 03/01/15 2200 .  sodium chloride 0.9 % injection 10-40 mL, 10-40 mL, Intracatheter, PRN, Janece Canterbury, MD, 10 mL at 03/02/15 0457 .  sodium chloride 0.9 % injection 3 mL, 3 mL, Intravenous, Q12H, Ivor Costa, MD, 3 mL at 02/27/15 2200 .  traZODone (DESYREL) tablet 50 mg, 50 mg, Oral, QHS PRN, Janece Canterbury, MD .  vancomycin (VANCOCIN) IVPB 1000 mg/200 mL premix, 1,000 mg, Intravenous, Q12H, Ripley Fraise, MD, 1,000 mg at 03/02/15 0600:  . azithromycin  500 mg Intravenous Q24H  . cefTAZidime (FORTAZ)  IV  2 g Intravenous Q8H  . dextromethorphan-guaiFENesin  1 tablet Oral BID  . famciclovir  500 mg Oral Daily  . fentaNYL  75 mcg Transdermal Q72H  . gabapentin  600 mg Oral TID  . insulin aspart  0-9 Units Subcutaneous TID WC  . montelukast  10 mg Oral QHS  . pantoprazole  40 mg Oral BID AC  . saccharomyces boulardii  250 mg Oral BID  . sodium chloride  3 mL Intravenous Q12H  . vancomycin  1,000 mg Intravenous Q12H  :  Allergies  Allergen Reactions  . Gadolinium Derivatives Nausea And Vomiting    Pt became very nauseous and got sick.   :  Family History  Problem Relation Age of Onset  . Diabetic kidney disease Mother   . Hypertension Mother   . Heart attack Mother   . Kidney failure Mother   . Coronary artery disease Mother   . HIV Father    :  History   Social History  . Marital Status: Married    Spouse Name: N/A  . Number of Children: N/A  . Years of Education: N/A   Occupational History  . Not on file.   Social History Main Topics  . Smoking status: Former Smoker -- 1.00 packs/day for 15 years    Types: Cigarettes    Start date: 06/29/1988    Quit date: 06/29/2003  . Smokeless tobacco: Never Used     Comment: quit smoking 10 years ago  . Alcohol Use: No  . Drug Use: No  . Sexual Activity: No   Other Topics Concern  . Not on file   Social History Narrative  :  Pertinent items are noted in HPI.  Exam: Patient Vitals for the past 24 hrs:  BP Temp  Temp src Pulse Resp SpO2  03/02/15 0527 115/90 mmHg 98.7 F (37.1 C) Oral 92 20 100 %  03/01/15 2132 118/71 mmHg 98.2 F (36.8 C) Oral 88 20 98 %  03/01/15 1600 102/60 mmHg 98.7 F (37.1 C) Oral (!) 111 18 98 %   Well developed and well-nourished African-American male. Head and neck exam shows no ocular or oral lesions. He has no adenopathy. Lungs are relatively clear bilaterally. Maybe some rhonchi over on the left side. He has good air movement bilaterally. Cardiac exam regular rate and rhythm with no murmurs, rubs or bruits. Abdomen is soft. Bowel sounds are present. He has no fluid wave. There is no palpable liver or spleen tip. Extremities shows no clubbing, cyanosis or edema. He has decent strength in his legs. Skin exam shows no rashes, ecchymoses or petechia. Neurological exam is nonfocal.    Recent Labs  03/01/15 1400 03/02/15 0500  WBC 5.1 4.6  HGB 10.4* 10.4*  HCT 30.6* 30.4*  PLT 62* 64*    Recent Labs  03/01/15 1400 03/02/15 0500  NA 136 137  K 4.3 4.6  CL 99* 101  CO2 30 28  GLUCOSE 104* 162*  BUN 16 16  CREATININE 0.94 1.15  CALCIUM 11.4* 11.7*    Blood smear review: none  Athology:  None     Assessment and Plan:   Mr. Vanwyk is a 44 year old Afro-American male with relapsed Kappa Lightchain myeloma. The overall prognosis  is not good. I think the only option for him for long-term survival is going to be an allogeneic transplant. However, we have to try to give into remission first. He is on chemotherapy. He really just started this. His labs will show [place] how well he is doing.  The hemoptysis, hopefully, is from pneumonia. He may not have a fever because of his steroids as part of the protocol. He is getting better with antibiotics.  Myeloma can certainly cause pulmonary hemorrhage. Typically, there'll be bilateral infiltrates on CT scan with myeloma-induced pulmonary hemorrhage.  For now, I think that I would just watch him. I will have to give him some calcitonin for the hypercalcemia.  I would hold off on transfusing him. He has mild thrombocytopenia. I don't think this is low enough that it would cause him to have bleeding problems.  I appreciate everybody's help with him. He typically improves with conservative measures.  He understands quite well the bad problem that he has with the relapsed myeloma. He is very reasonable and is appreciative of how we can help him.  We will follow along closely. Again, I think everybody for trying to help him out !!!  Laurey Arrow E.  1 John 4:4

## 2015-03-02 NOTE — Progress Notes (Signed)
[facility] PROGRESS NOTE  Cathan Gearin YIF:[id number] DOB: 08-19-1970 DOA: 02/27/2015 PCP: No PCP Per Patient  Brief Summary  Mark [name] is a 44 y.o. male with PMH of multiple myeloma (status post chemotherapy and radiation and bone marrow transplant), GERD, CKD-II, who presented with hemoptysis.    Patient reported that he has been having cough, mild shortness of breath and chest pain in the past 2 weeks. CT angio chest on 02/09/15 was negative for PE, but showed pneumonia. He was treated with the levofloxacin for 10 days, last dose was 02/22/15. He felt a little better, but continues to have 6/10 left sided chest pain.  On the morning of admission, he reported cough with blood in the sputum. He had 5 episodes of hemoptysis with teaspoonful blood in it.  He denied fever and chills.  He contacted his oncologist who recommended to come to ER for further evaluation.  In ED, patient was found to have WBC 6.1, temperature normal, temporarily tachycardia (currently heart rate 92), worsening renal function. Chest x-ray showed a new patchy infiltration over right side and CT chest demonstrated new 5cm alveolar opacity in the posterior segment of the right upper lobe and a smaller patchy alveolar opacity in the LUL laterally.    Assessment/Plan  Hemoptysis:  Differential diagnosis include alveolar hemorrhage, pneumonia.  Pulmonary embolism less likely since CT angio chest was negative a few weeks ago.  He is on daratumumab and velcade, the latter of which has been known to cause hemoptysis.  Treating for pneumonia.  He is stable on room air so no steroids at this time.   -  Continue vancomycin and Fortaz  -  Azithromycin added by pulmonology 7/31 -  BCx NGTD -  S. pneumo neg -  Legionella neg -  Plt count trending down -  Appreciate Pulmonary and ID assistance -  IgG 962 (wnl), IgA 28 (L), IgM 10 (L) on 7/27  -  If determined to be infectious, consider IVIG infusion  Kappa IgG light chain  multiple myeloma s/p bone marrow transplantation 2015 at [facility], with relapse.  Patient is on Daratumumab with Velcade and Decadron.  Last SPEP/IFE demonstrated on 7/27 demonstrated increasing kappa free light chain and rapidly increasing kappa:lambda ratio -  Appreciate Dr. Marin Olp assistance -  Continue famiciclovir prophylaxis -  Bone marrow biopsy on 7/6 demonstrated 7% plasma cells, Beta-2 microglobulin is stable  AoCKD-III: Baseline Cre is 1.1 to 1.3, his Cre was 1.40 on, resolved with IVF  RLQ pain, may be gas pains or hypercalcemia, resolved spontaneously.    GERD (gastroesophageal reflux disease) due to H. Pylori based on EGD biopsy from 7/8 - increased protonix to BID - Start triple therapy at discharge:  PPI, amox 1gm daily, and clarithromycin 5104m BID x 14 days  Abnormal EKG: Patient has diffused ST elevation (Lead I, and V3-V6) and PR segment elevation in aVR. These findings are consistent with possible pericarditis. - ECHO:  Moderate concentric hypertrophy with normal EF and no wall motion abnl or pericardial effusion  Normocytic anemia, hemoglobin stable, near baseline of 10-114mdl due MM and chemotherapy -  Adequate iron levels on last bone marrow biopsy -  F/u hgb  Thrombocytopenia, with hemoptysis, may be due to marrow suppression from chemotherapy or further progression of malignancy, stable -  Transfuse platelets   Hypercalcemia due to MM and dehydration, being treated with zometa, last dose 7/27 -  Not taking any calcium or vitamin D supplements -  Trended up despite IVF and  recent zometa -  Calcitonin started -  Consider increased IVF with lasix  Hyperglycemia, CBG mildly elevated but stable -  D/c SSI -  A1c 6  Diet:  regular Access:  port IVF:  yes Proph:  SCDs  Code Status: full Family Communication: patient and his wife Disposition Plan: pending further monitoring for bacteremia or further  hemorrhage   Consultants:  Pulmonology  ID  Oncology:  Spoke with Dr. Whitney Muse by phone, Dr. Marin Olp will be back tomorrow  Procedures:  CT chest  Antibiotics:  vanc 7/29 >  ceftaz 7/29 >   Azithromycin 7/31 >  HPI/Subjective:  Cough and hemoptysis have improved.    Objective: Filed Vitals:   03/01/15 2132 03/02/15 0527 03/02/15 1411 03/02/15 1446  BP: 118/71 115/90 117/75 107/61  Pulse: 88 92 89 87  Temp: 98.2 F (36.8 C) 98.7 F (37.1 C) 97.7 F (36.5 C) 98.5 F (36.9 C)  TempSrc: Oral Oral Oral Oral  Resp: _0 Height:      Weight:      SpO2: 98% 100% 100% 99%    Intake/Output Summary (Last 24 hours) at 03/02/15 1803 Last data filed at 03/02/15 0603  Gross per 24 hour  Intake 1863.75 ml  Output      0 ml  Net 1863.75 ml   Filed Weights   02/27/15 2031  Weight: 89.812 kg (198 lb)   Body mass index is 27.63 kg/(m^2).  Exam:   General:  Adult male, No acute distress  HEENT:  NCAT, MMM  Cardiovascular:  RRR, nl S1, S2 no mrg, 2+ pulses, warm extremities  Respiratory:  CTAB   Abdomen:   NABS, soft, ND, mild TTP in the RLQ  MSK:   Normal tone and bulk, no LEE  Neuro:  Grossly intact  Data Reviewed: Basic Metabolic Panel:  Recent Labs Lab 02/25/15 0829 02/27/15 1740 02/28/15 0122 03/01/15 1400 03/02/15 0500  NA 141 136 138 136 137  K 3.9 3.8 3.9 4.3 4.6  CL 100 97* 100* 99* 101  CO2 25  --  33* 30 28  GLUCOSE 220* 206* 169* 104* 162*  BUN 20 25* 21* 16 16  CREATININE 1.3* 1.40* 1.17 0.94 1.15  CALCIUM 12.7*  --  11.1* 11.4* 11.7*  MG  --   --   --  2.1  --   PHOS  --   --   --  2.4*  --    Liver Function Tests:  Recent Labs Lab 02/25/15 0829 02/28/15 0122 03/01/15 1400 03/02/15 0500  AST 34 31 33 32  ALT 47 42 42 39  ALKPHOS 75 72 76 75  BILITOT 1.70* 1.8* 1.5* 1.2  PROT 7.6 6.7 6.9 6.8  ALBUMIN  --  3.7 3.7  3.7 3.7    Recent Labs Lab 03/01/15 1400  LIPASE 21*   No results for input(s): AMMONIA in  the last 168 hours. CBC:  Recent Labs Lab 02/25/15 0828 02/27/15 1740 02/27/15 1808 02/28/15 0122 03/01/15 1400 03/02/15 0500  WBC 12.4*  --  6.1 5.6 5.1 4.6  NEUTROABS 10.4*  --  5.0  --   --  3.5  HGB 11.4* 10.2* 9.9* 10.0* 10.4* 10.4*  HCT 32.3* 30.0* 30.0* 30.4* 30.6* 30.4*  MCV 92  --  94.9 95.3 93.9 94.1  PLT 109*  --  70* 71* 62* 64*    Recent Results (from the past 240 hour(s))  Culture, blood (routine x 2) Call MD if unable to obtain  prior to antibiotics being given     Status: None (Preliminary result)   Collection Time: 02/28/15  1:20 AM  Result Value Ref Range Status   Specimen Description BLOOD LEFT HAND  Final   Special Requests BOTTLES DRAWN AEROBIC ONLY 5CC  Final   Culture   Final    NO GROWTH 2 DAYS Performed at [facility]    Report Status PENDING  Incomplete  Culture, blood (routine x 2) Call MD if unable to obtain prior to antibiotics being given     Status: None (Preliminary result)   Collection Time: 02/28/15  1:22 AM  Result Value Ref Range Status   Specimen Description BLOOD LEFT ANTECUBITAL  Final   Special Requests BOTTLES DRAWN AEROBIC ONLY 5CC  Final   Culture   Final    NO GROWTH 2 DAYS Performed at [facility]    Report Status PENDING  Incomplete  Culture, sputum-assessment     Status: None   Collection Time: 03/01/15  2:10 PM  Result Value Ref Range Status   Specimen Description SPUTUM  Final   Special Requests NONE  Final   Sputum evaluation   Final    MICROSCOPIC FINDINGS SUGGEST THAT THIS SPECIMEN IS NOT REPRESENTATIVE OF LOWER RESPIRATORY SECRETIONS. PLEASE RECOLLECT. NOTIFIED JOHNSON,A AT [id number] ON [id number] BY HOOKER,B    Report Status 03/01/2015 FINAL  Final     Studies: Dg Chest 2 View  03/02/2015   CLINICAL DATA:  Pneumonia.  EXAM: CHEST  2 VIEW  COMPARISON:  02/27/2015.  CT 02/27/2015 .  PET-CT 02/26/2015 .  FINDINGS: PowerPort catheter in stable position. Mediastinum hilar structures stable. Heart size normal.  Persistent right upper lobe infiltrate . Persistent left anterior fourth rib soft tissue lesion. No pleural effusion or pneumothorax. Bony lytic lesions consistent with myeloma best demonstrated by prior CT.  IMPRESSION: 1. Power port catheter in stable position. 2. Persistent right upper lobe infiltrate. 3. Persistent left anterior fourth rib soft tissue mass. 4. Bony lytic lesions consistent with myeloma best demonstrated by prior CT .   Electronically Signed   By: Willards   On: 03/02/2015 08:18    Scheduled Meds: . sodium chloride   Intravenous Once  . azithromycin  500 mg Intravenous Q24H  . calcitonin  540 Units Subcutaneous 3 times per day  . cefTAZidime (FORTAZ)  IV  2 g Intravenous Q8H  . dextromethorphan-guaiFENesin  1 tablet Oral BID  . famciclovir  500 mg Oral Daily  . fentaNYL  75 mcg Transdermal Q72H  . gabapentin  600 mg Oral TID  . insulin aspart  0-9 Units Subcutaneous TID WC  . montelukast  10 mg Oral QHS  . pantoprazole  40 mg Oral BID AC  . saccharomyces boulardii  250 mg Oral BID  . sodium chloride  3 mL Intravenous Q12H  . vancomycin  1,000 mg Intravenous Q12H   Continuous Infusions: . sodium chloride 75 mL/hr at 03/01/15 1800    Principal Problem:   Hemoptysis Active Problems:   Hypercalcemia   Lytic lesion of bone on x-ray   Multiple myeloma   Acute on chronic kidney failure   Immunocompromised   HCAP (healthcare-associated pneumonia)   GERD (gastroesophageal reflux disease)   H. pylori infection    Time spent: 30 min    Berkleigh Beckles, [facility] Pager [phone]. If 7PM-7AM, please contact night-coverage at [email or web address], password [email or web address] 03/02/2015, 6:03 PM  LOS: 3 days

## 2015-03-02 NOTE — Progress Notes (Signed)
[facility] Infectious [facility]    Date of Admission:  02/27/2015   Total days of antibiotics 4        Day 2 azithro        Day 4 ceftaz        Day 4 famciclovir   ID: Mark [name] is a 44 y.o. male with multiple myeloma with what appears to be worsening disease on therapy who presented 7/29 with hemoptysis. Has had cough, some SOB. Was recently treated with levaquin for 10 days and did feel better. Also loose stool on Daratumumab. CT chest with area of opacity concerning for infection vs hemorrhage. No fever, + chills. No blood in stool.   Principal Problem:   Hemoptysis Active Problems:   Hypercalcemia   Lytic lesion of bone on x-ray   Multiple myeloma   Acute on chronic kidney failure   Immunocompromised   HCAP (healthcare-associated pneumonia)   GERD (gastroesophageal reflux disease)   H. pylori infection    Subjective: Afebrile, feeling slightly better, less hemoptysis   Medications:  . sodium chloride   Intravenous Once  . azithromycin  500 mg Intravenous Q24H  . calcitonin  540 Units Subcutaneous 3 times per day  . cefTAZidime (FORTAZ)  IV  2 g Intravenous Q8H  . dextromethorphan-guaiFENesin  1 tablet Oral BID  . famciclovir  500 mg Oral Daily  . fentaNYL  75 mcg Transdermal Q72H  . gabapentin  600 mg Oral TID  . insulin aspart  0-9 Units Subcutaneous TID WC  . montelukast  10 mg Oral QHS  . pantoprazole  40 mg Oral BID AC  . saccharomyces boulardii  250 mg Oral BID  . sodium chloride  3 mL Intravenous Q12H  . vancomycin  1,000 mg Intravenous Q12H    Objective: Vital signs in last 24 hours: Temp:  [97.7 F (36.5 C)-98.7 F (37.1 C)] 98.5 F (36.9 C) (08/01 1446) Pulse Rate:  [87-92] 87 (08/01 1446) Resp:  [18-20] 18 (08/01 1446) BP: (107-118)/(61-90) 107/61 mmHg (08/01 1446) SpO2:  [98 %-100 %] 99 % (08/01 1446)   Physical Exam  Constitutional: He is oriented to person, place, and time. He appears well-developed and well-nourished. No  distress.  HENT:  Mouth/Throat: Oropharynx is clear and moist. No oropharyngeal exudate.  Cardiovascular: Normal rate, regular rhythm and normal heart sounds. Exam reveals no gallop and no friction rub.  No murmur heard.  Pulmonary/Chest: Effort normal and breath sounds normal. No respiratory distress. He has no wheezes.  Lymphadenopathy:  He has no cervical adenopathy.  Skin: Skin is warm and dry. No rash noted. No erythema.    Lab Results  Recent Labs  03/01/15 1400 03/02/15 0500  WBC 5.1 4.6  HGB 10.4* 10.4*  HCT 30.6* 30.4*  NA 136 137  K 4.3 4.6  CL 99* 101  CO2 30 28  BUN 16 16  CREATININE 0.94 1.15   Liver Panel  Recent Labs  03/01/15 1400 03/02/15 0500  PROT 6.9 6.8  ALBUMIN 3.7  3.7 3.7  AST 33 32  ALT 42 39  ALKPHOS 76 75  BILITOT 1.5* 1.2  BILIDIR 0.2  --   IBILI 1.3*  --     Microbiology: 7/30 blood cx ngtd Studies/Results: Dg Chest 2 View  03/02/2015   CLINICAL DATA:  Pneumonia.  EXAM: CHEST  2 VIEW  COMPARISON:  02/27/2015.  CT 02/27/2015 .  PET-CT 02/26/2015 .  FINDINGS: PowerPort catheter in stable position. Mediastinum hilar structures stable. Heart  size normal. Persistent right upper lobe infiltrate . Persistent left anterior fourth rib soft tissue lesion. No pleural effusion or pneumothorax. Bony lytic lesions consistent with myeloma best demonstrated by prior CT.  IMPRESSION: 1. Power port catheter in stable position. 2. Persistent right upper lobe infiltrate. 3. Persistent left anterior fourth rib soft tissue mass. 4. Bony lytic lesions consistent with myeloma best demonstrated by prior CT .   Electronically Signed   By: Marcello Moores  Register   On: 03/02/2015 08:18   - i reviewed the cxr and agree with findings noted in reading  - spoke with Dr. Halford Chessman from [facility] regarding plan to defer bronch at this time  Assessment/Plan: HCAP  = continue with current antibiotics for now but note that this may be worsening malignancy  Thrombocytopenia = appears  stable, no side effects of worsening due to antibiotics  [facility], [facility] for Infectious Diseases Cell: [phone] Pager: [phone]  03/02/2015, 4:57 PM

## 2015-03-02 NOTE — Progress Notes (Signed)
ANTIBIOTIC CONSULT NOTE - FOLLOW UP  Pharmacy Consult for vancomycin and ceftazidime Indication: HCAP  Allergies  Allergen Reactions  . Gadolinium Derivatives Nausea And Vomiting    Pt became very nauseous and got sick.     Patient Measurements: Height: 5' 11" (180.3 cm) Weight: 198 lb (89.812 kg) IBW/kg (Calculated) : 75.3  Vital Signs: Temp: 98.7 F (37.1 C) (08/01 0527) Temp Source: Oral (08/01 0527) BP: 115/90 mmHg (08/01 0527) Pulse Rate: 92 (08/01 0527) Intake/Output from previous day: 07/31 0701 - 08/01 0700 In: 3643.8 [P.O.:1440; I.V.:1903.8; IV Piggyback:300] Out: -  Intake/Output from this shift:    Labs:  Recent Labs  02/28/15 0122 02/28/15 0621 03/01/15 1400 03/02/15 0500  WBC 5.6  --  5.1 4.6  HGB 10.0*  --  10.4* 10.4*  PLT 71*  --  62* 64*  LABCREA  --  59.67  --   --   CREATININE 1.17  --  0.94 1.15   Estimated Creatinine Clearance: 89.1 mL/min (by C-G formula based on Cr of 1.15). No results for input(s): VANCOTROUGH, VANCOPEAK, VANCORANDOM, GENTTROUGH, GENTPEAK, GENTRANDOM, TOBRATROUGH, TOBRAPEAK, TOBRARND, AMIKACINPEAK, AMIKACINTROU, AMIKACIN in the last 72 hours.   Microbiology: Recent Results (from the past 720 hour(s))  Culture, blood (routine x 2) Call MD if unable to obtain prior to antibiotics being given     Status: None (Preliminary result)   Collection Time: 02/28/15  1:20 AM  Result Value Ref Range Status   Specimen Description BLOOD LEFT HAND  Final   Special Requests BOTTLES DRAWN AEROBIC ONLY 5CC  Final   Culture   Final    NO GROWTH 1 DAY Performed at [facility]    Report Status PENDING  Incomplete  Culture, blood (routine x 2) Call MD if unable to obtain prior to antibiotics being given     Status: None (Preliminary result)   Collection Time: 02/28/15  1:22 AM  Result Value Ref Range Status   Specimen Description BLOOD LEFT ANTECUBITAL  Final   Special Requests BOTTLES DRAWN AEROBIC ONLY 5CC  Final   Culture    Final    NO GROWTH 1 DAY Performed at [facility]    Report Status PENDING  Incomplete  Culture, sputum-assessment     Status: None   Collection Time: 03/01/15  2:10 PM  Result Value Ref Range Status   Specimen Description SPUTUM  Final   Special Requests NONE  Final   Sputum evaluation   Final    MICROSCOPIC FINDINGS SUGGEST THAT THIS SPECIMEN IS NOT REPRESENTATIVE OF LOWER RESPIRATORY SECRETIONS. PLEASE RECOLLECT. NOTIFIED JOHNSON,A AT [id number] ON [id number] BY HOOKER,B    Report Status 03/01/2015 FINAL  Final    Anti-infectives    Start     Dose/Rate Route Frequency Ordered Stop   03/01/15 0930  azithromycin (ZITHROMAX) 500 mg in dextrose 5 % 250 mL IVPB     500 mg 250 mL/hr over 60 Minutes Intravenous Every 24 hours 03/01/15 0839     02/28/15 0600  vancomycin (VANCOCIN) IVPB 1000 mg/200 mL premix     1,000 mg 200 mL/hr over 60 Minutes Intravenous Every 12 hours 02/27/15 1839     02/27/15 2200  famciclovir (FAMVIR) tablet 500 mg     500 mg Oral Daily 02/27/15 2034     02/27/15 1800  cefTAZidime (FORTAZ) 2 g in dextrose 5 % 50 mL IVPB     2 g 100 mL/hr over 30 Minutes Intravenous Every 8 hours 02/27/15 1726  02/27/15 1745  vancomycin (VANCOCIN) 2,000 mg in sodium chloride 0.9 % 500 mL IVPB     2,000 mg 250 mL/hr over 120 Minutes Intravenous STAT 02/27/15 1732 02/27/15 2029      Assessment: 83 yoM with relapsed multiple myeloma on salvage therapy with darzalex, velcade, and decadron s/p 2 cycles and recently treated outpatient with levaquin for pneumonia presents with hemoptysis and cough. CXR shows new patchy focal consolidation in mid to upper right lung with differential pneumonia or alveolar hemorrhage. Pharmacy consulted to start vancomycin and ceftazidime for HCAP.  He's currently on day #4 of abx with plan for possible broch and BAL later this week.  He is afebrile with cultures negative thus far.  7/29 >> Vancomycin Rx >> 7/29 >> Ceftazidime  Rx>>   7/31 >>  Azithromycin>>  7/30 Bcx x2: ngtd 7/31 Sputum (trach): need to recollect Strep pneumo: neg Legionella: pending   Goal of Therapy:  Vancomycin trough level 15-20 mcg/ml  Plan:  - cont Ceftazidime 2g IV q8h - will check steady state vancomycin level tonight to assess current regimen - cont Vancomycin 1g IV q12h  Pham, Anh P 03/02/2015,8:12 AM

## 2015-03-02 NOTE — Progress Notes (Addendum)
Brief Pharmacy Progress Note:   Please refer to progress note by Dia Sitter, PharmD, at 8:12AM for full details. In brief, this is a 48 y/oM being treated for HCAP with hemoptysis, currently being followed by ID, pulmonology, oncology, and hospitalist services.  Vancomycin trough = 10 mcg/mL at 1700 (drawn 1 hour prior to next dose), subtherapeutic Goal trough 15-20 mcg/mL SCr slightly increased to 1.15 today, CrCl ~ 89 ml/min CG   Plan:  Increase Vancomycin to 1g IV q8h  Plan to repeat trough level at new steady state, pending duration of therapy.  Monitor renal function, cultures, clinical course.  BMET in AM.   Lindell Spar, PharmD, BCPS Pager: [phone] 03/02/2015 6:21 PM

## 2015-03-02 NOTE — Progress Notes (Signed)
Name: Mark [name] MRN: [id number] DOB: [birth year]    ADMISSION DATE:  02/27/2015 CONSULTATION DATE: 03/01/2015  REFERRING MD :  [facility]  CHIEF COMPLAINT:  Hemoptysis  BRIEF PATIENT DESCRIPTION:  61 male former smoker presented with cough, dyspnea, and chest pain.  Found to have PNA as outpt and tx with levaquin.  He developed hemoptysis.  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  He was found to have thrombocytopenia.  SIGNIFICANT EVENTS  7/29 Admit 7/31 ID consulted 8/01 Oncology consulted  STUDIES:  7/11 CT chest >> GGO LLL and RUL, innumerable osseous lytic lesions 7/28 PET scan >> hypermetabolic foci in skeleton 7/29 CT chest >> ASD RUL with air bronchograms, minimal ASD LUL 7/30 Echo >> EF 55 to 60%  SUBJECTIVE:  He is not having as much cough, and not bringing up much sputum/blood.  He still has pain in Lt chest, but not Rt.  VITAL SIGNS: Temp:  [98.2 F (36.8 C)-98.7 F (37.1 C)] 98.7 F (37.1 C) (08/01 0527) Pulse Rate:  [88-111] 92 (08/01 0527) Resp:  [18-20] 20 (08/01 0527) BP: (102-118)/(60-90) 115/90 mmHg (08/01 0527) SpO2:  [98 %-100 %] 100 % (08/01 0527)  PHYSICAL EXAMINATION: General: pleasant Neuro:  Alert, normal strength HEENT:  Pupils reactive Cardiovascular:  Regular, no murmur Lungs:  No  wheeze Abdomen:  Soft, non tender Musculoskeletal:  No edema Skin:  No rashes   CMP Latest Ref Rng 03/02/2015 03/01/2015 02/28/2015  Glucose 65 - 99 mg/dL 162(H) 104(H) 169(H)  BUN 6 - 20 mg/dL 16 16 21(H)  Creatinine 0.61 - 1.24 mg/dL 1.15 0.94 1.17  Sodium 135 - 145 mmol/L 137 136 138  Potassium 3.5 - 5.1 mmol/L 4.6 4.3 3.9  Chloride 101 - 111 mmol/L 101 99(L) 100(L)  CO2 22 - 32 mmol/L 28 30 33(H)  Calcium 8.9 - 10.3 mg/dL 11.7(H) 11.4(H) 11.1(H)  Total Protein 6.5 - 8.1 g/dL 6.8 6.9 6.7  Albumin 3.3 - 5.5 g/dL - - -  Total Bilirubin 0.3 - 1.2 mg/dL 1.2 1.5(H)  1.8(H)  Alkaline Phos 38 - 126 U/L 75 76 72  AST 15 - 41 U/L 32 33 31  ALT 17 - 63 U/L 39 42 42    CBC Latest Ref Rng 03/02/2015 03/01/2015 02/28/2015  WBC 4.0 - 10.5 K/uL 4.6 5.1 5.6  Hemoglobin 13.0 - 17.0 g/dL 10.4(L) 10.4(L) 10.0(L)  Hematocrit 39.0 - 52.0 % 30.4(L) 30.6(L) 30.4(L)  Platelets 150 - 400 K/uL 64(L) 62(L) 71(L)      Dg Chest 2 View  03/02/2015   CLINICAL DATA:  Pneumonia.  EXAM: CHEST  2 VIEW  COMPARISON:  02/27/2015.  CT 02/27/2015 .  PET-CT 02/26/2015 .  FINDINGS: PowerPort catheter in stable position. Mediastinum hilar structures stable. Heart size normal. Persistent right upper lobe infiltrate . Persistent left anterior fourth rib soft tissue lesion. No pleural effusion or pneumothorax. Bony lytic lesions consistent with myeloma best demonstrated by prior CT.  IMPRESSION: 1. Power port catheter in stable position. 2. Persistent right upper lobe infiltrate. 3. Persistent left anterior fourth rib soft tissue mass. 4. Bony lytic lesions consistent with myeloma best demonstrated by prior CT .   Electronically Signed   By: Marcello Moores  Register   On: 03/02/2015 08:18   ANTIBIOTICS: Vancomycin 7/29 >> Famciclovir 7/29 >> Ceftazidime 7/29 >> Azithromycin 7/31 >>  CULTURES: Blood 7/30 >>   DISCUSSION:  Rt upper lobe infiltrate with  hemoptysis >> more concerning for HCAP in setting of Kappa light chain myeloma on chemotherapy.  He is improving clinically with decreased hemoptysis.  ASSESSMENT / PLAN:  HCAP with hemoptysis. Plan: - f/u CXR intermittently - continue Abx per ID - defer bronchoscopy for now since he has some clinical improvement  Kappa chain myeloma with hypercalcemia. Anemia, thrombocytopenia. Plan: - per primary team and oncology   Chesley Mires, MD [facility] Pulmonary/Critical Care 03/02/2015, 9:53 AM Pager:  [phone] After 3pm call: [phone]

## 2015-03-03 ENCOUNTER — Inpatient Hospital Stay (HOSPITAL_COMMUNITY): Payer: BLUE CROSS/BLUE SHIELD

## 2015-03-03 DIAGNOSIS — R0600 Dyspnea, unspecified: Secondary | ICD-10-CM | POA: Insufficient documentation

## 2015-03-03 LAB — BASIC METABOLIC PANEL
Anion gap: 5 (ref 5–15)
BUN: 13 mg/dL (ref 6–20)
CHLORIDE: 101 mmol/L (ref 101–111)
CO2: 29 mmol/L (ref 22–32)
CREATININE: 0.94 mg/dL (ref 0.61–1.24)
Calcium: 12 mg/dL — ABNORMAL HIGH (ref 8.9–10.3)
GFR calc Af Amer: >60 mL/min (ref >60)
Glucose, Bld: 191 mg/dL — ABNORMAL HIGH (ref 65–99)
Potassium: 5.1 mmol/L (ref 3.5–5.1)
Sodium: 135 mmol/L (ref 135–145)

## 2015-03-03 LAB — CALCIUM, IONIZED: CALCIUM, IONIZED, SERUM: 6.8 mg/dL — AB (ref 4.5–5.6)

## 2015-03-03 LAB — GLUCOSE, CAPILLARY: Glucose-Capillary: 86 mg/dL (ref 65–99)

## 2015-03-03 MED ORDER — IPRATROPIUM-ALBUTEROL 0.5-2.5 (3) MG/3ML IN SOLN
3.0000 mL | Freq: Four times a day (QID) | RESPIRATORY_TRACT | Status: DC
  Administered 2015-03-03: 3 mL via RESPIRATORY_TRACT
  Filled 2015-03-03 (×2): qty 3

## 2015-03-03 MED ORDER — SENNOSIDES-DOCUSATE SODIUM 8.6-50 MG PO TABS
1.0000 | ORAL_TABLET | Freq: Two times a day (BID) | ORAL | Status: DC
  Filled 2015-03-03: qty 1

## 2015-03-03 MED ORDER — ZOLEDRONIC ACID 4 MG/5ML IV CONC
4.0000 mg | Freq: Once | INTRAVENOUS | Status: AC
  Administered 2015-03-03: 4 mg via INTRAVENOUS
  Filled 2015-03-03: qty 5

## 2015-03-03 MED ORDER — POLYETHYLENE GLYCOL 3350 17 G PO PACK
17.0000 g | PACK | Freq: Every day | ORAL | Status: DC
  Filled 2015-03-03: qty 1

## 2015-03-03 MED ORDER — FUROSEMIDE 10 MG/ML IJ SOLN
20.0000 mg | Freq: Once | INTRAMUSCULAR | Status: AC
  Administered 2015-03-03: 20 mg via INTRAVENOUS
  Filled 2015-03-03: qty 2

## 2015-03-03 NOTE — Progress Notes (Signed)
Patient refusing SCD's. Patient stated that he gets up and walk. Will continue to monitor closely.

## 2015-03-03 NOTE — Progress Notes (Signed)
[facility] PROGRESS NOTE  Indigo Chaddock PPJ:[id number] DOB: [birth year] DOA: 02/27/2015 PCP: No PCP Per Patient  Brief Summary  Quadre Bristol is a 44 y.o. male with PMH of multiple myeloma (status post chemotherapy and radiation and bone marrow transplant), GERD, CKD-II, who presented with hemoptysis.    Patient reported that he has been having cough, mild shortness of breath and chest pain in the past 2 weeks. CT angio chest on 02/09/15 was negative for PE, but showed pneumonia. He was treated with the levofloxacin for 10 days, last dose was 02/22/15. He felt a little better, but continues to have 6/10 left sided chest pain.  On the morning of admission, he reported cough with blood in the sputum. He had 5 episodes of hemoptysis with teaspoonful blood in it.  He denied fever and chills.  He contacted his oncologist who recommended to come to ER for further evaluation.  In ED, patient was found to have WBC 6.1, temperature normal, temporarily tachycardia (currently heart rate 92), worsening renal function. Chest x-ray showed a new patchy infiltration over right side and CT chest demonstrated new 5cm alveolar opacity in the posterior segment of the right upper lobe and a smaller patchy alveolar opacity in the LUL laterally.    Assessment/Plan  Hemoptysis:  Differential diagnosis include alveolar hemorrhage, pneumonia.  Pulmonary embolism less likely since CT angio chest was negative a few weeks ago.  Cough and hemoptysis improving. -  Continue vancomycin and Fortaz  -  Azithromycin added by pulmonology 7/31 -  BCx NGTD -  S. pneumo neg -  Legionella neg -  Appreciate Pulmonary and ID assistance  Kappa IgG light chain multiple myeloma s/p bone marrow transplantation 2015 at [facility], with relapse.  Patient is on Daratumumab with Velcade and Decadron.  Last SPEP/IFE demonstrated on 7/27 demonstrated increasing kappa free light chain and rapidly increasing kappa:lambda ratio.  Bone marrow biopsy on  7/6 demonstrated 7% plasma cells, Beta-2 microglobulin is stable -  Appreciate Dr. Marin Olp assistance -  Continue famiciclovir prophylaxis -  May need VD-PACE per Dr. Marin Olp  AoCKD-III: Baseline Cre is 1.1 to 1.3, his Cre was 1.40 on, resolved with IVF  RLQ pain, may be gas pains or hypercalcemia, resolved spontaneously.    GERD (gastroesophageal reflux disease) due to H. Pylori based on EGD biopsy from 7/8 - increased protonix to BID - Start triple therapy at discharge:  PPI, amox 1gm daily, and clarithromycin 545m BID x 14 days  Abnormal EKG: Patient has diffused ST elevation (Lead I, and V3-V6) and PR segment elevation in aVR. These findings are consistent with possible pericarditis. - ECHO:  Moderate concentric hypertrophy with normal EF and no wall motion abnl or pericardial effusion  Normocytic anemia, hemoglobin stable, near baseline of 10-160mdl due MM and chemotherapy -  Adequate iron levels on last bone marrow biopsy -  F/u hgb  Thrombocytopenia, with hemoptysis, may be due to marrow suppression from chemotherapy or further progression of malignancy, stable -  Transfuse platelets   Hypercalcemia due to MM and dehydration, being treated with zometa, last dose 7/27 -  Not taking any calcium or vitamin D supplements -  Trended up despite IVF and recent zometa -  Calcitonin started but Ca went up -  Increase IVF and add lasix -  Additional zometa ordered by Dr. EnMarin OlpHyperglycemia, CBG mildly elevated but stable.  A1c 6  Diet:  regular Access:  port IVF:  yes Proph:  SCDs (due to progressive thrombocytopenia)  Code  Status: full Family Communication: patient alone Disposition Plan:  Pending feeling better, calcium trending down   Consultants:  Pulmonology  ID  Oncology:  Spoke with Dr. Whitney Muse by phone, Dr. Marin Olp will be back tomorrow  Procedures:  CT chest  Antibiotics:  vanc 7/29 >  ceftaz 7/29 >   Azithromycin 7/31  >  HPI/Subjective:  Cough and hemoptysis have improved, however, he is more Harrie Cazarez of breath today and is wearing oxygen.  States he developed increased SOB yesterday which has not improved.    Objective: Filed Vitals:   03/03/15 0306 03/03/15 0605 03/03/15 1107 03/03/15 1323  BP: 111/60 105/56  117/75  Pulse: 85 88  90  Temp: 98.5 F (36.9 C) 97.9 F (36.6 C)  98.3 F (36.8 C)  TempSrc: Oral Oral  Oral  Resp: _0 Height:      Weight:      SpO2:  98% 98%     Intake/Output Summary (Last 24 hours) at 03/03/15 1529 Last data filed at 03/03/15 0600  Gross per 24 hour  Intake 2437.5 ml  Output      0 ml  Net 2437.5 ml   Filed Weights   02/27/15 2031  Weight: 89.812 kg (198 lb)   Body mass index is 27.63 kg/(m^2).  Exam:   General:  Adult male, No acute distress, mild tachypnea  HEENT:  NCAT, MMM  Cardiovascular:  RRR, nl S1, S2 no mrg, 2+ pulses, warm extremities  Respiratory:  CTAB   Abdomen:   NABS  MSK:   Normal tone and bulk, no LEE  Neuro:  Grossly intact  Data Reviewed: Basic Metabolic Panel:  Recent Labs Lab 02/25/15 0829 02/27/15 1740 02/28/15 0122 03/01/15 1400 03/02/15 0500 03/03/15 0342  NA 141 136 138 136 137 135  K 3.9 3.8 3.9 4.3 4.6 5.1  CL 100 97* 100* 99* 101 101  CO2 25  --  33* _1 GLUCOSE 220* 206* 169* 104* 162* 191*  BUN 20 25* 21* _2 CREATININE 1.3* 1.40* 1.17 0.94 1.15 0.94  CALCIUM 12.7*  --  11.1* 11.4* 11.7* 12.0*  MG  --   --   --  2.1  --   --   PHOS  --   --   --  2.4*  --   --    Liver Function Tests:  Recent Labs Lab 02/25/15 0829 02/28/15 0122 03/01/15 1400 03/02/15 0500  AST 34 31 33 32  ALT 47 42 42 39  ALKPHOS 75 72 76 75  BILITOT 1.70* 1.8* 1.5* 1.2  PROT 7.6 6.7 6.9 6.8  ALBUMIN  --  3.7 3.7  3.7 3.7    Recent Labs Lab 03/01/15 1400  LIPASE 21*   No results for input(s): AMMONIA in the last 168 hours. CBC:  Recent Labs Lab 02/25/15 0828 02/27/15 1740 02/27/15 1808  02/28/15 0122 03/01/15 1400 03/02/15 0500  WBC 12.4*  --  6.1 5.6 5.1 4.6  NEUTROABS 10.4*  --  5.0  --   --  3.5  HGB 11.4* 10.2* 9.9* 10.0* 10.4* 10.4*  HCT 32.3* 30.0* 30.0* 30.4* 30.6* 30.4*  MCV 92  --  94.9 95.3 93.9 94.1  PLT 109*  --  70* 71* 62* 64*    Recent Results (from the past 240 hour(s))  Culture, blood (routine x 2) Call MD if unable to obtain prior to antibiotics being given     Status: None (Preliminary result)  Collection Time: 02/28/15  1:20 AM  Result Value Ref Range Status   Specimen Description BLOOD LEFT HAND  Final   Special Requests BOTTLES DRAWN AEROBIC ONLY 5CC  Final   Culture   Final    NO GROWTH 3 DAYS Performed at [facility]    Report Status PENDING  Incomplete  Culture, blood (routine x 2) Call MD if unable to obtain prior to antibiotics being given     Status: None (Preliminary result)   Collection Time: 02/28/15  1:22 AM  Result Value Ref Range Status   Specimen Description BLOOD LEFT ANTECUBITAL  Final   Special Requests BOTTLES DRAWN AEROBIC ONLY 5CC  Final   Culture   Final    NO GROWTH 3 DAYS Performed at [facility]    Report Status PENDING  Incomplete  Culture, sputum-assessment     Status: None   Collection Time: 03/01/15  2:10 PM  Result Value Ref Range Status   Specimen Description SPUTUM  Final   Special Requests NONE  Final   Sputum evaluation   Final    MICROSCOPIC FINDINGS SUGGEST THAT THIS SPECIMEN IS NOT REPRESENTATIVE OF LOWER RESPIRATORY SECRETIONS. PLEASE RECOLLECT. NOTIFIED JOHNSON,A AT [id number] ON [id number] BY HOOKER,B    Report Status 03/01/2015 FINAL  Final     Studies: Dg Chest 2 View  03/02/2015   CLINICAL DATA:  Pneumonia.  EXAM: CHEST  2 VIEW  COMPARISON:  02/27/2015.  CT 02/27/2015 .  PET-CT 02/26/2015 .  FINDINGS: PowerPort catheter in stable position. Mediastinum hilar structures stable. Heart size normal. Persistent right upper lobe infiltrate . Persistent left anterior fourth rib soft tissue  lesion. No pleural effusion or pneumothorax. Bony lytic lesions consistent with myeloma best demonstrated by prior CT.  IMPRESSION: 1. Power port catheter in stable position. 2. Persistent right upper lobe infiltrate. 3. Persistent left anterior fourth rib soft tissue mass. 4. Bony lytic lesions consistent with myeloma best demonstrated by prior CT .   Electronically Signed   By: Marcello Moores  Register   On: 03/02/2015 08:18   Dg Chest Port 1 View  03/03/2015   CLINICAL DATA:  Hypoxia, dyspnea  EXAM: PORTABLE CHEST - 1 VIEW  COMPARISON:  03/02/2015  FINDINGS: Right upper lobe infiltrate appears slightly improved.  No effusion  Mild bibasilar atelectasis related to hypoventilation and decreased lung volume. No effusion. Port-A-Cath tip in the upper right atrium unchanged. Vascularity normal.  IMPRESSION: Mild improvement right upper lobe infiltrate consistent with pneumonia  Hypoventilation with increase in bibasilar atelectasis.   Electronically Signed   By: Franchot Gallo M.D.   On: 03/03/2015 11:23    Scheduled Meds: . azithromycin  500 mg Intravenous Q24H  . cefTAZidime (FORTAZ)  IV  2 g Intravenous Q8H  . dextromethorphan-guaiFENesin  1 tablet Oral BID  . famciclovir  500 mg Oral Daily  . fentaNYL  75 mcg Transdermal Q72H  . gabapentin  600 mg Oral TID  . ipratropium-albuterol  3 mL Nebulization Q6H  . montelukast  10 mg Oral QHS  . pantoprazole  40 mg Oral BID AC  . saccharomyces boulardii  250 mg Oral BID  . sodium chloride  3 mL Intravenous Q12H  . vancomycin  1,000 mg Intravenous Q8H   Continuous Infusions: . sodium chloride 125 mL/hr at 03/03/15 0730    Principal Problem:   Hemoptysis Active Problems:   Hypercalcemia   Lytic lesion of bone on x-ray   Multiple myeloma   Acute on chronic kidney failure  Immunocompromised   HCAP (healthcare-associated pneumonia)   GERD (gastroesophageal reflux disease)   H. pylori infection    Time spent: 30 min    Neha Waight, [place]  [facility] Pager [phone]. If 7PM-7AM, please contact night-coverage at [email or web address], password [facility] 03/03/2015, 3:29 PM  LOS: 4 days

## 2015-03-03 NOTE — Progress Notes (Signed)
Mr. Fiero had a little bit of a tough time last night area and he said he had some shortness of breath.  A chest x-ray yesterday showed the right upper lobe infiltrate. This appeared to be the same.  He says is not coughing up any blood.  His calcium today is 12. This is somewhat surprising. He started calcitonin yesterday. I will add Zometa.  He is not complaining much in way of pain. He still has pain but it is about the same.  Is not having diarrhea.  His appetite is okay. There is no nausea or vomiting.  So far, cultures have been negative.  [name], his temperature is 97.9. Pulse 88. Blood pressure 105/56. Head and neck exam shows no ocular or oral lesions. He has no adenopathy in the neck. Lungs are clear. There may be some occasional expiratory wheezes. He has decerebrate movement bilaterally. Cardiac exam regular rate and rhythm with no murmurs, rubs or bruits. Abdomen is soft. Has good bowel sounds. There is no fluid wave. There is no palpable liver or spleen tip. Back exam shows no tenderness over the spine, ribs or hips. Extremities shows no clubbing, cyanosis or edema. Neurological exam is nonfocal.  Hopefully, we are just this information where this is just pneumonia. I don't think we had to give him IVIG. His IgG level was 962.  I think the calcium level will tell us what is happening. I would like to think that a calcium level will be down by tomorrow. He is on calcitonin.  I suppose that if we have to institute myeloma therapy as inpatient, we can always utilize the dose intense regimen that he had before transplant. This worked very well for him. We use a regimen called VD-PACE, which is similar to a leukemic regimen. Again this got him into remission.  I appreciate all the ray care that he is getting. He appreciates everybody's help. I know that he is trying his best.  [place] 15:16

## 2015-03-03 NOTE — Progress Notes (Signed)
Name: Mark [name] MRN: [id number] DOB: [birth year]    ADMISSION DATE:  02/27/2015 CONSULTATION DATE: 03/01/2015  REFERRING MD :  [facility]  CHIEF COMPLAINT:  Hemoptysis  BRIEF PATIENT DESCRIPTION:  76 male former smoker presented with cough, dyspnea, and chest pain.  Found to have PNA as outpt and tx with levaquin.  He developed hemoptysis.  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  He was found to have thrombocytopenia.  SIGNIFICANT EVENTS  7/29 Admit 7/31 ID consulted 8/01 Oncology consulted  STUDIES:  7/11 CT chest >> GGO LLL and RUL, innumerable osseous lytic lesions 7/28 PET scan >> hypermetabolic foci in skeleton 7/29 CT chest >> ASD RUL with air bronchograms, minimal ASD LUL 7/30 Echo >> EF 55 to 60%  SUBJECTIVE:  Had some dyspnea last night.  Not as bad this AM.  Had one loose BM >> now in contact isolation.  Denies abdominal pain.  No further episodes of hemoptysis.  VITAL SIGNS: Temp:  [97.5 F (36.4 C)-98.8 F (37.1 C)] 97.9 F (36.6 C) (08/02 0605) Pulse Rate:  [85-92] 88 (08/02 0605) Resp:  [16-18] 18 (08/02 0605) BP: (103-117)/(53-75) 105/56 mmHg (08/02 0605) SpO2:  [98 %-100 %] 98 % (08/02 1107)  PHYSICAL EXAMINATION: General: pleasant Neuro:  Alert, normal strength HEENT:  Pupils reactive Cardiovascular:  Regular, no murmur Lungs:  No  wheeze Abdomen:  Soft, non tender Musculoskeletal:  No edema Skin:  No rashes   CMP Latest Ref Rng 03/03/2015 03/02/2015 03/01/2015  Glucose 65 - 99 mg/dL 191(H) 162(H) 104(H)  BUN 6 - 20 mg/dL 13 16 16   Creatinine 0.61 - 1.24 mg/dL 0.94 1.15 0.94  Sodium 135 - 145 mmol/L 135 137 136  Potassium 3.5 - 5.1 mmol/L 5.1 4.6 4.3  Chloride 101 - 111 mmol/L 101 101 99(L)  CO2 22 - 32 mmol/L 29 28 30   Calcium 8.9 - 10.3 mg/dL 12.0(H) 11.7(H) 11.4(H)  Total Protein 6.5 - 8.1 g/dL - 6.8 6.9  Albumin 3.3 - 5.5 g/dL - - -  Total Bilirubin 0.3  - 1.2 mg/dL - 1.2 1.5(H)  Alkaline Phos 38 - 126 U/L - 75 76  AST 15 - 41 U/L - 32 33  ALT 17 - 63 U/L - 39 42    CBC Latest Ref Rng 03/02/2015 03/01/2015 02/28/2015  WBC 4.0 - 10.5 K/uL 4.6 5.1 5.6  Hemoglobin 13.0 - 17.0 g/dL 10.4(L) 10.4(L) 10.0(L)  Hematocrit 39.0 - 52.0 % 30.4(L) 30.6(L) 30.4(L)  Platelets 150 - 400 K/uL 64(L) 62(L) 71(L)      Dg Chest 2 View  03/02/2015   CLINICAL DATA:  Pneumonia.  EXAM: CHEST  2 VIEW  COMPARISON:  02/27/2015.  CT 02/27/2015 .  PET-CT 02/26/2015 .  FINDINGS: PowerPort catheter in stable position. Mediastinum hilar structures stable. Heart size normal. Persistent right upper lobe infiltrate . Persistent left anterior fourth rib soft tissue lesion. No pleural effusion or pneumothorax. Bony lytic lesions consistent with myeloma best demonstrated by prior CT.  IMPRESSION: 1. Power port catheter in stable position. 2. Persistent right upper lobe infiltrate. 3. Persistent left anterior fourth rib soft tissue mass. 4. Bony lytic lesions consistent with myeloma best demonstrated by prior CT .   Electronically Signed   By: Marcello Moores  Register   On: 03/02/2015 08:18   Dg Chest Port 1 View  03/03/2015   CLINICAL DATA:  Hypoxia, dyspnea  EXAM: PORTABLE CHEST - 1  VIEW  COMPARISON:  03/02/2015  FINDINGS: Right upper lobe infiltrate appears slightly improved.  No effusion  Mild bibasilar atelectasis related to hypoventilation and decreased lung volume. No effusion. Port-A-Cath tip in the upper right atrium unchanged. Vascularity normal.  IMPRESSION: Mild improvement right upper lobe infiltrate consistent with pneumonia  Hypoventilation with increase in bibasilar atelectasis.   Electronically Signed   By: Franchot Gallo M.D.   On: 03/03/2015 11:23   ANTIBIOTICS: Vancomycin 7/29 >> Famciclovir 7/29 >> Ceftazidime 7/29 >> Azithromycin 7/31 >>  CULTURES: Blood 7/30 >>   DISCUSSION:  Rt upper lobe infiltrate with hemoptysis >> more concerning for HCAP in setting of Kappa  light chain myeloma on chemotherapy.  He is improving clinically with decreased hemoptysis.  Chest xray from 8/02 shows improving infiltrate.  ASSESSMENT / PLAN:  HCAP with hemoptysis. Plan: - f/u CXR intermittently - continue Abx per ID - defer bronchoscopy for now since he has some clinical improvement  Kappa chain myeloma with hypercalcemia. Anemia, thrombocytopenia. Plan: - per primary team and oncology   Chesley Mires, MD [place] 03/03/2015, 11:44 AM Pager:  [phone] After 3pm call: [phone]

## 2015-03-04 ENCOUNTER — Ambulatory Visit: Payer: BLUE CROSS/BLUE SHIELD | Admitting: Hematology & Oncology

## 2015-03-04 ENCOUNTER — Other Ambulatory Visit: Payer: BLUE CROSS/BLUE SHIELD

## 2015-03-04 ENCOUNTER — Ambulatory Visit: Payer: BLUE CROSS/BLUE SHIELD

## 2015-03-04 DIAGNOSIS — Y95 Nosocomial condition: Secondary | ICD-10-CM

## 2015-03-04 LAB — RENAL FUNCTION PANEL
ANION GAP: 5 (ref 5–15)
Albumin: 3.8 g/dL (ref 3.5–5.0)
BUN: 12 mg/dL (ref 6–20)
CALCIUM: 13.9 mg/dL — AB (ref 8.9–10.3)
CO2: 28 mmol/L (ref 22–32)
CREATININE: 0.94 mg/dL (ref 0.61–1.24)
Chloride: 101 mmol/L (ref 101–111)
GFR calc Af Amer: >60 mL/min (ref >60)
GFR calc non Af Amer: >60 mL/min (ref >60)
GLUCOSE: 120 mg/dL — AB (ref 65–99)
Phosphorus: 3.1 mg/dL (ref 2.5–4.6)
Potassium: 5.1 mmol/L (ref 3.5–5.1)
Sodium: 134 mmol/L — ABNORMAL LOW (ref 135–145)

## 2015-03-04 LAB — PREPARE PLATELET PHERESIS: Unit division: 0

## 2015-03-04 LAB — CBC
HCT: 32.9 % — ABNORMAL LOW (ref 39.0–52.0)
Hemoglobin: 10.9 g/dL — ABNORMAL LOW (ref 13.0–17.0)
MCH: 30.6 pg (ref 26.0–34.0)
MCHC: 33.1 g/dL (ref 30.0–36.0)
MCV: 92.4 fL (ref 78.0–100.0)
Platelets: 114 10*3/uL — ABNORMAL LOW (ref 150–400)
RBC: 3.56 MIL/uL — AB (ref 4.22–5.81)
RDW: 14.5 % (ref 11.5–15.5)
WBC: 5.4 10*3/uL (ref 4.0–10.5)

## 2015-03-04 MED ORDER — ALTEPLASE 2 MG IJ SOLR: 2.0000 mg | Freq: Once | INTRAMUSCULAR | Status: AC | PRN

## 2015-03-04 MED ORDER — SODIUM CHLORIDE 0.9 % IV SOLN
Freq: Once | INTRAVENOUS | Status: AC
  Administered 2015-03-04: 1085 mg via INTRAVENOUS
  Filled 2015-03-04: qty 21

## 2015-03-04 MED ORDER — SODIUM CHLORIDE 0.9 % IJ SOLN
10.0000 mL | Freq: Two times a day (BID) | INTRAMUSCULAR | Status: DC
  Administered 2015-03-05 – 2015-03-06 (×2): 10 mL

## 2015-03-04 MED ORDER — DENOSUMAB 120 MG/1.7ML ~~LOC~~ SOLN
60.0000 mg | Freq: Once | SUBCUTANEOUS | Status: DC
  Filled 2015-03-04: qty 0.9

## 2015-03-04 MED ORDER — DEXAMETHASONE 6 MG PO TABS
40.0000 mg | ORAL_TABLET | Freq: Once | ORAL | Status: AC
  Administered 2015-03-04: 40 mg via ORAL
  Filled 2015-03-04: qty 1

## 2015-03-04 MED ORDER — HEPARIN SOD (PORK) LOCK FLUSH 100 UNIT/ML IV SOLN: 250.0000 [IU] | Freq: Once | INTRAVENOUS | Status: AC | PRN

## 2015-03-04 MED ORDER — DOXORUBICIN HCL CHEMO IV INJECTION 2 MG/ML
10.0000 mg/m2 | Freq: Once | INTRAVENOUS | Status: AC
  Administered 2015-03-04: 22 mg via INTRAVENOUS
  Filled 2015-03-04: qty 11

## 2015-03-04 MED ORDER — BORTEZOMIB CHEMO IV INJECTION 3.5 MG
1.3000 mg/m2 | Freq: Once | INTRAMUSCULAR | Status: AC
  Administered 2015-03-04: 2.8 mg via INTRAVENOUS
  Filled 2015-03-04: qty 2.8

## 2015-03-04 MED ORDER — DENOSUMAB 60 MG/ML ~~LOC~~ SOLN
60.0000 mg | Freq: Once | SUBCUTANEOUS | Status: AC
  Administered 2015-03-04: 60 mg via SUBCUTANEOUS
  Filled 2015-03-04: qty 1

## 2015-03-04 MED ORDER — HOT PACK MISC ONCOLOGY
1.0000 | Freq: Once | Status: AC | PRN
  Filled 2015-03-04: qty 1

## 2015-03-04 MED ORDER — SODIUM CHLORIDE 0.9 % IJ SOLN: 10.0000 mL | INTRAMUSCULAR | Status: DC | PRN

## 2015-03-04 MED ORDER — COLD PACK MISC ONCOLOGY
1.0000 | Freq: Once | Status: AC | PRN
  Filled 2015-03-04: qty 1

## 2015-03-04 MED ORDER — SODIUM CHLORIDE 0.9 % IV SOLN
INTRAVENOUS | Status: DC
  Administered 2015-03-04 – 2015-03-08 (×2): via INTRAVENOUS

## 2015-03-04 MED ORDER — PALONOSETRON HCL INJECTION 0.25 MG/5ML
0.2500 mg | Freq: Once | INTRAVENOUS | Status: AC
  Administered 2015-03-04: 0.25 mg via INTRAVENOUS
  Filled 2015-03-04: qty 5

## 2015-03-04 MED ORDER — SODIUM CHLORIDE 0.9 % IJ SOLN: 3.0000 mL | INTRAMUSCULAR | Status: DC | PRN

## 2015-03-04 MED ORDER — POTASSIUM CHLORIDE 2 MEQ/ML IV SOLN
Freq: Once | INTRAVENOUS | Status: AC
  Administered 2015-03-04: 16:00:00 via INTRAVENOUS
  Filled 2015-03-04: qty 10

## 2015-03-04 MED ORDER — IPRATROPIUM-ALBUTEROL 0.5-2.5 (3) MG/3ML IN SOLN: 3.0000 mL | RESPIRATORY_TRACT | Status: DC | PRN

## 2015-03-04 MED ORDER — SODIUM CHLORIDE 0.9 % IV SOLN
Freq: Once | INTRAVENOUS | Status: AC
  Administered 2015-03-04: 16:00:00 via INTRAVENOUS
  Filled 2015-03-04: qty 5

## 2015-03-04 MED ORDER — CALCITONIN (SALMON) 200 UNIT/ML IJ SOLN
700.0000 [IU] | Freq: Four times a day (QID) | INTRAMUSCULAR | Status: AC
  Administered 2015-03-04 – 2015-03-05 (×4): 700 [IU] via INTRAMUSCULAR
  Filled 2015-03-04 (×6): qty 3.5

## 2015-03-04 MED ORDER — HEPARIN SOD (PORK) LOCK FLUSH 100 UNIT/ML IV SOLN: 500.0000 [IU] | Freq: Once | INTRAVENOUS | Status: AC | PRN

## 2015-03-04 NOTE — Progress Notes (Signed)
Patient transferred to 1340 per order, patient alert and oriented, denies any distress. Reported off to [employer].

## 2015-03-04 NOTE — Progress Notes (Signed)
BSA and chemo calculations for Velcade, Cytoxan, Cisplatin, Adriamycin and Etoposide checked. Verified with Legrand Rams, RN

## 2015-03-04 NOTE — Progress Notes (Signed)
We have a HUGE problem. His calcium is going up quickly. His calcium is now 13.9. It is obvious that his myeloma is driving all of this. As such, I think he is going to require emergent chemotherapy. I think what he has been taking the office, has not been effective. I think it probably would be effective but it may take several more weeks before we see a response.  I believe that he will respond to the intensive chemotherapy regimen that we had him on prior to bone marrow transplant. He was on the VD-PACE regimen. This essentially is a leukemic-type regimen.  Thankfully he already has a Port-A-Cath in. I suspect he probably will need a PICC line in for all of the chemotherapy.  For the hypercalcemia, I will increase his calcitonin and I will try him on Xgeva. His heart assay if he is refractory to bisphosphonates, but I will think that since he got Zometa last week, that we should be seeing a response.  He actually still looks quite good. He has a little bit of nausea. He says his pain is doing okay. He's had no some shortness of breath. There is no hemoptysis. A chest x-ray yesterday looked better.  I talked to him about this situation. I was incredibly honest and up from with him. I told him that if we cannot get his calcium better and if he cannot get his myeloma treated, I don't think he would survive more than 1-2 months. As such, he is well aware of the critical nature of his problem.  I will have to see about getting him down to the third floor for treatment. I spoke to the nurses on the third floor. They say that there is a bed for him.  He is on some type of enteric precautions. He has not had any diarrhea.  [name], his temperature 98.5 by mouth pulse 88. Blood pressure 119/59. Head and neck exam shows no ocular or oral lesions. He has no adenopathy in the neck. There is no thrush. Lungs are clear bilaterally. No wheezes are noted. Cardiac exam regular rate and rhythm with no  murmurs, rubs or bruits. Abdomen is soft. Has good sounds. There is no fluid wave. There is no palpable liver or spleen tip. Extremities shows no clubbing, cyanosis or edema. Neurological exam shows no focal deficits.  I believe that we are clearly looked at refractory hypercalcemia. Again, this is being driven by his relapsed Kappa Lightchain myeloma. He is showing [place] how difficult light chain myeloma is to treat. Of all the subtypes of myeloma, Kappa Lightchain myeloma is probably where the most resilient. When we did his last bone marrow biopsy, he had multiple chromosomal abnormalities which, again, goes along with the resilience of his disease.  I will plan to start treatment today.  I will still follow along in consultation. I very much appreciate the hospitalist doing a great job with him and helping out with the other aspects of his care.  He has a strong faith. This certainly will come in handy.  Frederich Cha 1:12

## 2015-03-04 NOTE — Progress Notes (Signed)
[facility]    Date of Admission:  02/27/2015   Total days of antibiotics 6        Day 4 azithro        Day 6 ceftaz/vanco        Day 6 famciclovir   ID: Mark [name] is a 44 y.o. male with multiple myeloma with what appears to be worsening disease on therapy who presented 7/29 with hemoptysis. Has had cough, some SOB. Was recently treated with levaquin for 10 days and did feel better. Also loose stool on Daratumumab. CT chest with area of opacity concerning for infection vs hemorrhage. No fever, + chills. No blood in stool.   Principal Problem:   Hemoptysis Active Problems:   Hypercalcemia   Lytic lesion of bone on x-ray   Multiple myeloma   Acute on chronic kidney failure   Immunocompromised   HCAP (healthcare-associated pneumonia)   GERD (gastroesophageal reflux disease)   H. pylori infection   Dyspnea    Subjective: Afebrile, less cough adn shortness of breath, no diarrhea or abdominal pain  Objective : has elevated serum calcium up to 13.9 Medications:  . azithromycin  500 mg Intravenous Q24H  . bortezomib IV  1.3 mg/m2 (Treatment Plan Actual) Intravenous Once  . calcitonin  700 Units Intramuscular Q6H  . cefTAZidime (FORTAZ)  IV  2 g Intravenous Q8H  . CISplatin with cyclophosphamide and etoposide chemo infusion   Intravenous Once  . dextromethorphan-guaiFENesin  1 tablet Oral BID  . DOXOrubicin (ADRIAMYCIN) CHEMO for Inpatient continous infusion  10 mg/m2 (Treatment Plan Actual) Intravenous Once  . famciclovir  500 mg Oral Daily  . fentaNYL  75 mcg Transdermal Q72H  . fosaprepitant (EMEND) IV infusion 150 mg + dexamethasone   Intravenous Once  . gabapentin  600 mg Oral TID  . ipratropium-albuterol  3 mL Nebulization Q6H  . montelukast  10 mg Oral QHS  . pantoprazole  40 mg Oral BID AC  . saccharomyces boulardii  250 mg Oral BID  . sodium chloride  10-40 mL Intracatheter Q12H  . sodium chloride  3 mL Intravenous Q12H  .  vancomycin  1,000 mg Intravenous Q8H    Objective: Vital signs in last 24 hours: Temp:  [98.5 F (36.9 C)-98.8 F (37.1 C)] 98.8 F (37.1 C) (08/03 1228) Pulse Rate:  [88-120] 120 (08/03 1228) Resp:  [18-20] 20 (08/03 1228) BP: (119-162)/(59-90) 162/90 mmHg (08/03 1228) SpO2:  [98 %-100 %] 98 % (08/03 1228)   Physical Exam  Constitutional: He is oriented to person, place, and time. He appears well-developed and well-nourished. No distress.  HENT: wearing [place] Mouth/Throat: Oropharynx is clear and moist. No oropharyngeal exudate.  Pulmonary/Chest: Effort normal and breath sounds normal. No respiratory distress. He has no wheezes.   Lab Results  Recent Labs  03/02/15 0500 03/03/15 0342 03/04/15 0400 03/04/15 0410  WBC 4.6  --  5.4  --   HGB 10.4*  --  10.9*  --   HCT 30.4*  --  32.9*  --   NA 137 135  --  134*  K 4.6 5.1  --  5.1  CL 101 101  --  101  CO2 28 29  --  28  BUN 16 13  --  12  CREATININE 1.15 0.94  --  0.94   Liver Panel  Recent Labs  03/02/15 0500 03/04/15 0410  PROT 6.8  --   ALBUMIN 3.7 3.8  AST 32  --  ALT 39  --   ALKPHOS 75  --   BILITOT 1.2  --     Microbiology: 7/30 blood cx ngtd Studies/Results: Dg Chest Port 1 View  03/03/2015   CLINICAL DATA:  Hypoxia, dyspnea  EXAM: PORTABLE CHEST - 1 VIEW  COMPARISON:  03/02/2015  FINDINGS: Right upper lobe infiltrate appears slightly improved.  No effusion  Mild bibasilar atelectasis related to hypoventilation and decreased lung volume. No effusion. Port-A-Cath tip in the upper right atrium unchanged. Vascularity normal.  IMPRESSION: Mild improvement right upper lobe infiltrate consistent with pneumonia  Hypoventilation with increase in bibasilar atelectasis.   Electronically Signed   By: Franchot Gallo M.D.   On: 03/03/2015 11:23   - i reviewed the cxr which shows some improvement in RUL    Assessment/Plan: 44yo M with kappa light chain myeloma presnting with infiltrate, hemoptysis found to have  hypercalcemia being treated for HCAP  HCAP  = continue with current vanco/ceftaz/azithro to finish of a total of 7 days of treatment tomorrow (will be last day of antibiotics)  Antiviral proph = can keep famciclovir proph dosing  Thrombocytopenia = appears stable, no side effects of worsening due to antibiotics  Hypercalcemia due to myeloma = due to myeloma, defer to oncology for initiation of chemo today  Baxter Flattery [facility] for Infectious Diseases Cell: [phone] Pager: [phone]  03/04/2015, 4:42 PM

## 2015-03-04 NOTE — Progress Notes (Signed)
CRITICAL VALUE ALERT  Critical value received:  Calcium 13.9  Date of notification:  03/04/15  Time of notification:  0510  Critical value read back:Yes.    Nurse who received alert:  Virgina Norfolk   MD notified (1st page):  Schorr  Time of first page:  [phone]  MD notified (2nd page):  Time of second page:  Responding MD:  No response  Time MD responded:  n/a

## 2015-03-04 NOTE — Progress Notes (Signed)
Name: Mark [name] MRN: [id number] DOB: 1971-01-23    ADMISSION DATE:  02/27/2015 CONSULTATION DATE: 03/01/2015  REFERRING MD :  [facility]  CHIEF COMPLAINT:  Hemoptysis  BRIEF PATIENT DESCRIPTION:  5 male former smoker presented with cough, dyspnea, and chest pain.  Found to have PNA as outpt and tx with levaquin.  He developed hemoptysis.  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  He was found to have thrombocytopenia.  SIGNIFICANT EVENTS  7/29 Admit 7/31 ID consulted 8/01 Oncology consulted  STUDIES:  7/11 CT chest >> GGO LLL and RUL, innumerable osseous lytic lesions 7/28 PET scan >> hypermetabolic foci in skeleton 7/29 CT chest >> ASD RUL with air bronchograms, minimal ASD LUL 7/30 Echo >> EF 55 to 60%  SUBJECTIVE:  Improving dyspnea Had one loose BM >> now in contact isolation.   Denies abdominal pain.  No further episodes of hemoptysis.  VITAL SIGNS: Temp:  [98.3 F (36.8 C)-98.5 F (36.9 C)] 98.5 F (36.9 C) (08/03 0540) Pulse Rate:  [88-90] 88 (08/03 0540) Resp:  [18] 18 (08/03 0540) BP: (117-145)/(59-75) 119/59 mmHg (08/03 0540) SpO2:  [98 %-100 %] 98 % (08/03 0540)  PHYSICAL EXAMINATION: General: pleasant Neuro:  Alert, normal strength HEENT:  Pupils reactive Cardiovascular:  Regular, no murmur Lungs:  No  wheeze Abdomen:  Soft, non tender Musculoskeletal:  No edema Skin:  No rashes   CMP Latest Ref Rng 03/04/2015 03/03/2015 03/02/2015  Glucose 65 - 99 mg/dL 120(H) 191(H) 162(H)  BUN 6 - 20 mg/dL 12 13 16   Creatinine 0.61 - 1.24 mg/dL 0.94 0.94 1.15  Sodium 135 - 145 mmol/L 134(L) 135 137  Potassium 3.5 - 5.1 mmol/L 5.1 5.1 4.6  Chloride 101 - 111 mmol/L 101 101 101  CO2 22 - 32 mmol/L 28 29 28   Calcium 8.9 - 10.3 mg/dL 13.9(HH) 12.0(H) 11.7(H)  Total Protein 6.5 - 8.1 g/dL - - 6.8  Albumin 3.3 - 5.5 g/dL - - -  Total Bilirubin 0.3 - 1.2 mg/dL - - 1.2  Alkaline  Phos 38 - 126 U/L - - 75  AST 15 - 41 U/L - - 32  ALT 17 - 63 U/L - - 39    CBC Latest Ref Rng 03/04/2015 03/02/2015 03/01/2015  WBC 4.0 - 10.5 K/uL 5.4 4.6 5.1  Hemoglobin 13.0 - 17.0 g/dL 10.9(L) 10.4(L) 10.4(L)  Hematocrit 39.0 - 52.0 % 32.9(L) 30.4(L) 30.6(L)  Platelets 150 - 400 K/uL 114(L) 64(L) 62(L)      Dg Chest Port 1 View  03/03/2015   CLINICAL DATA:  Hypoxia, dyspnea  EXAM: PORTABLE CHEST - 1 VIEW  COMPARISON:  03/02/2015  FINDINGS: Right upper lobe infiltrate appears slightly improved.  No effusion  Mild bibasilar atelectasis related to hypoventilation and decreased lung volume. No effusion. Port-A-Cath tip in the upper right atrium unchanged. Vascularity normal.  IMPRESSION: Mild improvement right upper lobe infiltrate consistent with pneumonia  Hypoventilation with increase in bibasilar atelectasis.   Electronically Signed   By: Franchot Gallo M.D.   On: 03/03/2015 11:23   ANTIBIOTICS: Vancomycin 7/29 >> Famciclovir 7/29 >> Ceftazidime 7/29 >> Azithromycin 7/31 >>  CULTURES: Blood 7/30 >> ng  DISCUSSION:  Rt upper lobe infiltrate with hemoptysis >> more concerning for HCAP in setting of Kappa light chain myeloma on chemotherapy.  He is improving clinically with decreased hemoptysis.  Chest xray from 8/02 shows improving infiltrate.  ASSESSMENT /  PLAN:  HCAP with hemoptysis. Plan: - continue Abx per ID, can stop azithro on 8/4 - defer bronchoscopy   Kappa chain myeloma with hypercalcemia. Anemia, thrombocytopenia. Plan: - per primary team and oncology -notes reviewed  PCCM to sign off   03/04/2015, 10:20 AM

## 2015-03-04 NOTE — Progress Notes (Signed)
[facility] PROGRESS NOTE  Bubber Rothert CZY:[id number] DOB: Dec 12, 1970 DOA: 02/27/2015 PCP: No PCP Per Patient  Brief Summary  Mark [name] is a 44 y.o. male with PMH of multiple myeloma (status post chemotherapy and radiation and bone marrow transplant), GERD, CKD-II, who presented with hemoptysis.  Patient reported that he had been having cough, mild shortness of breath and chest pain for 2 weeks prior to admission. CT angio chest on 02/09/15 was negative for PE, but showed pneumonia. He was treated with the levofloxacin for 10 days, last dose was 02/22/15. He felt a little better, but continued to have 6/10 left sided chest pain.  On the morning of admission, he reported cough with blood in the sputum. He had 5 episodes of hemoptysis with teaspoonful blood in it.  He denied fever and chills.  He contacted his oncologist Dr. Marin Olp, who recommended to come to ER for further evaluation.  In ED, patient was found to have WBC 6.1, temperature normal, tachycardia, worsening renal function. Chest x-ray showed a new patchy infiltration over right side.  CT chest demonstrated new 5cm alveolar opacity in the posterior segment of the right upper lobe and a smaller patchy alveolar opacity in the LUL laterally.  He was admitted and started on broad-spectrum antibiotics for possible healthcare associated pneumonia. His hemoptysis improved somewhat. He received a platelet transfusion due to progressive thrombocytopenia. It appeared that he had been started on some new chemotherapy secondary to progression of his relapsed multiple myeloma, however he had refractory hypercalcemia which suggested rapid progression. He was given IV fluids, Lasix, calcitonin, and an additional dose of Zometa even though it had a dose less than one week prior. His calcium remained high and he will be started on emergent chemotherapy. He had been on the transplant wait list at [facility].  Assessment/Plan  Hemoptysis:  Differential diagnosis  include alveolar hemorrhage, pneumonia.  Pulmonary embolism less likely since CT angio chest was negative a few weeks ago.  Cough and hemoptysis improving. -  Continue vancomycin and Fortaz  -  Azithromycin added by pulmonology 7/31, completed 1.5gm -  BCx NGTD -  S. 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  Patient is on Daratumumab with Velcade and Decadron.  Last SPEP/IFE demonstrated on 7/27 demonstrated increasing kappa free light chain and rapidly increasing kappa:lambda ratio.  Bone marrow biopsy on 7/6 demonstrated 7% plasma cells, Beta-2 microglobulin is stable -  Appreciate Dr. Marin Olp assistance -  Continue famiciclovir prophylaxis -  Starting emergent VD-PACE per Dr. Marin Olp  AoCKD-III: Baseline Cre is 1.1 to 1.3, his Cre was 1.40 on, resolved with IVF  RLQ pain, may be gas pains or hypercalcemia, resolved spontaneously.    GERD (gastroesophageal reflux disease) due to H. Pylori based on EGD biopsy from 7/8 - increased protonix to BID - Start triple therapy at discharge:  PPI, amox 1gm daily, and clarithromycin 567m BID x 14 days  Abnormal EKG: Patient has diffused ST elevation (Lead I, and V3-V6) and PR segment elevation in aVR. These findings are consistent with possible pericarditis. - ECHO:  Moderate concentric hypertrophy with normal EF and no wall motion abnl or pericardial effusion  Normocytic anemia, hemoglobin stable, near baseline of 10-144mdl due MM and chemotherapy -  Adequate iron levels on last bone marrow biopsy -  hgb stable  Thrombocytopenia, with hemoptysis, may be due to marrow suppression from  chemotherapy or further progression of malignancy, stable -  Transfused platelets 8/2  Hypercalcemia due to MM and dehydration, being treated with zometa, last dose 7/27 -  Not taking any calcium or vitamin D supplements -  Trended  up despite IVF and recent zometa -  Calcitonin started but Ca went up -  Increased IVF and add lasix and additional dose of zometa, but ca up more -  Urgent chemotherapy as above  Hyperglycemia, CBG mildly elevated but stable.  A1c 6  Diet:  regular Access:  port IVF:  yes Proph:  SCDs (due to progressive thrombocytopenia)  Code Status: full Family Communication: patient alone Disposition Plan:  Pending feeling better, calcium trending down   Consultants:  Pulmonology  ID  Oncology:  Spoke with Dr. Whitney Muse by phone, Dr. Marin Olp will be back tomorrow  Procedures:  CT chest  Antibiotics:  vanc 7/29 >  ceftaz 7/29 >   Azithromycin 7/31 >  HPI/Subjective:  Preparing himself mentally for starting chemotherapy.  He denies recent diarrhea. He continues to have chronic pains and nausea and headache. He asks to be taken off his IV fluids for a few hours and to not be disturbed so that he can rest before starting his chemotherapy.  Objective: Filed Vitals:   03/03/15 2232 03/04/15 0540 03/04/15 1228 03/04/15 1430  BP: 145/69 119/59 162/90 117/87  Pulse: 88 88 120 95  Temp: 98.5 F (36.9 C) 98.5 F (36.9 C) 98.8 F (37.1 C) 98.5 F (36.9 C)  TempSrc: Oral Oral Oral Oral  Resp: '18 18 20 20  ' Height:      Weight:      SpO2: 100% 98% 98% 100%    Intake/Output Summary (Last 24 hours) at 03/04/15 1731 Last data filed at 03/04/15 0913  Gross per 24 hour  Intake   3450 ml  Output      0 ml  Net   3450 ml   Filed Weights   02/27/15 2031  Weight: 89.812 kg (198 lb)   Body mass index is 27.63 kg/(m^2).  Exam:   General:  Adult male, No acute distress, nasal cannula in place  HEENT:  NCAT, MMM  Cardiovascular:  Tachycardic,, nl S1, S2 no mrg, 2+ pulses, warm extremities  Respiratory:  CTAB   Abdomen:   NABS  MSK:   Normal tone and bulk, no LEE  Neuro:  Grossly intact  Data Reviewed: Basic Metabolic Panel:  Recent Labs Lab 02/28/15 0122  03/01/15 1400 03/02/15 0500 03/03/15 0342 03/04/15 0410  NA 138 136 137 135 134*  K 3.9 4.3 4.6 5.1 5.1  CL 100* 99* 101 101 101  CO2 33* '30 28 29 28  ' GLUCOSE 169* 104* 162* 191* 120*  BUN 21* '16 16 13 12  ' CREATININE 1.17 0.94 1.15 0.94 0.94  CALCIUM 11.1* 11.4* 11.7* 12.0* 13.9*  MG  --  2.1  --   --   --   PHOS  --  2.4*  --   --  3.1   Liver Function Tests:  Recent Labs Lab 02/28/15 0122 03/01/15 1400 03/02/15 0500 03/04/15 0410  AST 31 33 32  --   ALT 42 42 39  --   ALKPHOS 72 76 75  --   BILITOT 1.8* 1.5* 1.2  --   PROT 6.7 6.9 6.8  --   ALBUMIN 3.7 3.7  3.7 3.7 3.8    Recent Labs Lab 03/01/15 1400  LIPASE 21*   No results for input(s): AMMONIA in the  last 168 hours. CBC:  Recent Labs Lab 02/27/15 1808 02/28/15 0122 03/01/15 1400 03/02/15 0500 03/04/15 0400  WBC 6.1 5.6 5.1 4.6 5.4  NEUTROABS 5.0  --   --  3.5  --   HGB 9.9* 10.0* 10.4* 10.4* 10.9*  HCT 30.0* 30.4* 30.6* 30.4* 32.9*  MCV 94.9 95.3 93.9 94.1 92.4  PLT 70* 71* 62* 64* 114*    Recent Results (from the past 240 hour(s))  Culture, blood (routine x 2) Call MD if unable to obtain prior to antibiotics being given     Status: None (Preliminary result)   Collection Time: 02/28/15  1:20 AM  Result Value Ref Range Status   Specimen Description BLOOD LEFT HAND  Final   Special Requests BOTTLES DRAWN AEROBIC ONLY 5CC  Final   Culture   Final    NO GROWTH 4 DAYS Performed at [facility]    Report Status PENDING  Incomplete  Culture, blood (routine x 2) Call MD if unable to obtain prior to antibiotics being given     Status: None (Preliminary result)   Collection Time: 02/28/15  1:22 AM  Result Value Ref Range Status   Specimen Description BLOOD LEFT ANTECUBITAL  Final   Special Requests BOTTLES DRAWN AEROBIC ONLY 5CC  Final   Culture   Final    NO GROWTH 4 DAYS Performed at [facility]    Report Status PENDING  Incomplete  Culture, sputum-assessment     Status: None    Collection Time: 03/01/15  2:10 PM  Result Value Ref Range Status   Specimen Description SPUTUM  Final   Special Requests NONE  Final   Sputum evaluation   Final    MICROSCOPIC FINDINGS SUGGEST THAT THIS SPECIMEN IS NOT REPRESENTATIVE OF LOWER RESPIRATORY SECRETIONS. PLEASE RECOLLECT. NOTIFIED JOHNSON,A AT [id number] ON [id number] BY HOOKER,B    Report Status 03/01/2015 FINAL  Final     Studies: Dg Chest Port 1 View  03/03/2015   CLINICAL DATA:  Hypoxia, dyspnea  EXAM: PORTABLE CHEST - 1 VIEW  COMPARISON:  03/02/2015  FINDINGS: Right upper lobe infiltrate appears slightly improved.  No effusion  Mild bibasilar atelectasis related to hypoventilation and decreased lung volume. No effusion. Port-A-Cath tip in the upper right atrium unchanged. Vascularity normal.  IMPRESSION: Mild improvement right upper lobe infiltrate consistent with pneumonia  Hypoventilation with increase in bibasilar atelectasis.   Electronically Signed   By: Franchot Gallo M.D.   On: 03/03/2015 11:23    Scheduled Meds: . azithromycin  500 mg Intravenous Q24H  . calcitonin  700 Units Intramuscular Q6H  . cefTAZidime (FORTAZ)  IV  2 g Intravenous Q8H  . CISplatin with cyclophosphamide and etoposide chemo infusion   Intravenous Once  . dextromethorphan-guaiFENesin  1 tablet Oral BID  . DOXOrubicin (ADRIAMYCIN) CHEMO for Inpatient continous infusion  10 mg/m2 (Treatment Plan Actual) Intravenous Once  . famciclovir  500 mg Oral Daily  . fentaNYL  75 mcg Transdermal Q72H  . gabapentin  600 mg Oral TID  . ipratropium-albuterol  3 mL Nebulization Q6H  . montelukast  10 mg Oral QHS  . pantoprazole  40 mg Oral BID AC  . saccharomyces boulardii  250 mg Oral BID  . sodium chloride  10-40 mL Intracatheter Q12H  . sodium chloride  3 mL Intravenous Q12H  . vancomycin  1,000 mg Intravenous Q8H   Continuous Infusions: . sodium chloride 10 mL/hr at 03/04/15 1625    Principal Problem:   Hemoptysis Active Problems:  Hypercalcemia    Lytic lesion of bone on x-ray   Multiple myeloma   Acute on chronic kidney failure   Immunocompromised   HCAP (healthcare-associated pneumonia)   GERD (gastroesophageal reflux disease)   H. pylori infection   Dyspnea    Time spent: 30 min    Astor Gentle, [facility] Pager [phone]. If 7PM-7AM, please contact night-coverage at [email or web address], password [facility] 03/04/2015, 5:31 PM  LOS: 5 days

## 2015-03-04 NOTE — Progress Notes (Signed)
Peripherally Inserted Central Catheter/Midline Placement  The IV Nurse has discussed with the patient and/or persons authorized to consent for the patient, the purpose of this procedure and the potential benefits and risks involved with this procedure.  The benefits include less needle sticks, lab draws from the catheter and patient may be discharged home with the catheter.  Risks include, but not limited to, infection, bleeding, blood clot (thrombus formation), and puncture of an artery; nerve damage and irregular heat beat.  Alternatives to this procedure were also discussed.  PICC/Midline Placement Documentation        Henderson [place] 03/04/2015, 3:40 PM

## 2015-03-05 DIAGNOSIS — Z9484 Stem cells transplant status: Secondary | ICD-10-CM

## 2015-03-05 DIAGNOSIS — E875 Hyperkalemia: Secondary | ICD-10-CM | POA: Insufficient documentation

## 2015-03-05 LAB — BASIC METABOLIC PANEL
ANION GAP: 8 (ref 5–15)
BUN: 21 mg/dL — AB (ref 6–20)
CO2: 22 mmol/L (ref 22–32)
Calcium: 10.7 mg/dL — ABNORMAL HIGH (ref 8.9–10.3)
Chloride: 101 mmol/L (ref 101–111)
Creatinine, Ser: 0.95 mg/dL (ref 0.61–1.24)
GFR calc Af Amer: >60 mL/min (ref >60)
Glucose, Bld: 295 mg/dL — ABNORMAL HIGH (ref 65–99)
POTASSIUM: 5.5 mmol/L — AB (ref 3.5–5.1)
Sodium: 131 mmol/L — ABNORMAL LOW (ref 135–145)

## 2015-03-05 LAB — RENAL FUNCTION PANEL
ALBUMIN: 4 g/dL (ref 3.5–5.0)
Anion gap: 5 (ref 5–15)
BUN: 19 mg/dL (ref 6–20)
CO2: 24 mmol/L (ref 22–32)
Calcium: 11.4 mg/dL — ABNORMAL HIGH (ref 8.9–10.3)
Chloride: 101 mmol/L (ref 101–111)
Creatinine, Ser: 0.98 mg/dL (ref 0.61–1.24)
GFR calc non Af Amer: >60 mL/min (ref >60)
Glucose, Bld: 228 mg/dL — ABNORMAL HIGH (ref 65–99)
POTASSIUM: 5.9 mmol/L — AB (ref 3.5–5.1)
Phosphorus: 1.3 mg/dL — ABNORMAL LOW (ref 2.5–4.6)
Sodium: 130 mmol/L — ABNORMAL LOW (ref 135–145)

## 2015-03-05 LAB — CBC
HCT: 30.3 % — ABNORMAL LOW (ref 39.0–52.0)
HEMOGLOBIN: 9.8 g/dL — AB (ref 13.0–17.0)
MCH: 29.8 pg (ref 26.0–34.0)
MCHC: 32.3 g/dL (ref 30.0–36.0)
MCV: 92.1 fL (ref 78.0–100.0)
Platelets: 102 10*3/uL — ABNORMAL LOW (ref 150–400)
RBC: 3.29 MIL/uL — ABNORMAL LOW (ref 4.22–5.81)
RDW: 14.4 % (ref 11.5–15.5)
WBC: 5.7 10*3/uL (ref 4.0–10.5)

## 2015-03-05 LAB — GLUCOSE, CAPILLARY
GLUCOSE-CAPILLARY: 268 mg/dL — AB (ref 65–99)
Glucose-Capillary: 274 mg/dL — ABNORMAL HIGH (ref 65–99)

## 2015-03-05 LAB — CULTURE, BLOOD (ROUTINE X 2)
CULTURE: NO GROWTH
Culture: NO GROWTH

## 2015-03-05 MED ORDER — INSULIN ASPART 100 UNIT/ML ~~LOC~~ SOLN
6.0000 [IU] | Freq: Three times a day (TID) | SUBCUTANEOUS | Status: DC
  Administered 2015-03-05 – 2015-03-09 (×8): 6 [IU] via SUBCUTANEOUS

## 2015-03-05 MED ORDER — SODIUM CHLORIDE 0.9 % IV SOLN
10.0000 mg/m2 | Freq: Once | INTRAVENOUS | Status: AC
  Administered 2015-03-05: 22 mg via INTRAVENOUS
  Filled 2015-03-05: qty 11

## 2015-03-05 MED ORDER — SODIUM BICARBONATE/SODIUM CHLORIDE MOUTHWASH
Freq: Four times a day (QID) | OROMUCOSAL | Status: DC
  Administered 2015-03-05 – 2015-03-09 (×17): via OROMUCOSAL
  Filled 2015-03-05: qty 1000

## 2015-03-05 MED ORDER — DEXTROSE-NACL 5-0.45 % IV SOLN
Freq: Once | INTRAVENOUS | Status: AC
  Administered 2015-03-05: 21:00:00 via INTRAVENOUS
  Filled 2015-03-05: qty 3

## 2015-03-05 MED ORDER — INSULIN ASPART 100 UNIT/ML ~~LOC~~ SOLN
0.0000 [IU] | Freq: Three times a day (TID) | SUBCUTANEOUS | Status: DC
  Administered 2015-03-05: 8 [IU] via SUBCUTANEOUS
  Administered 2015-03-06: 3 [IU] via SUBCUTANEOUS
  Administered 2015-03-06: 8 [IU] via SUBCUTANEOUS
  Administered 2015-03-06 – 2015-03-07 (×2): 5 [IU] via SUBCUTANEOUS
  Administered 2015-03-07: 3 [IU] via SUBCUTANEOUS
  Administered 2015-03-08 – 2015-03-09 (×2): 2 [IU] via SUBCUTANEOUS

## 2015-03-05 MED ORDER — DEXAMETHASONE 6 MG PO TABS
40.0000 mg | ORAL_TABLET | Freq: Once | ORAL | Status: DC
  Filled 2015-03-05: qty 1

## 2015-03-05 MED ORDER — BIOTENE DRY MOUTH MT LIQD
15.0000 mL | Freq: Every day | OROMUCOSAL | Status: DC
  Administered 2015-03-05 – 2015-03-09 (×24): 15 mL via OROMUCOSAL

## 2015-03-05 MED ORDER — SODIUM PHOSPHATE 3 MMOLE/ML IV SOLN
10.0000 mmol | Freq: Once | INTRAVENOUS | Status: AC
  Administered 2015-03-05: 10 mmol via INTRAVENOUS
  Filled 2015-03-05: qty 3.33

## 2015-03-05 MED ORDER — DEXAMETHASONE 6 MG PO TABS
40.0000 mg | ORAL_TABLET | Freq: Once | ORAL | Status: AC
  Administered 2015-03-05: 40 mg via ORAL
  Filled 2015-03-05: qty 1

## 2015-03-05 MED ORDER — ETOPOSIDE CHEMO INJECTION 500 MG/25ML
Freq: Once | INTRAVENOUS | Status: AC
  Administered 2015-03-05: 1085 mg via INTRAVENOUS
  Filled 2015-03-05: qty 21

## 2015-03-05 MED ORDER — INSULIN ASPART 100 UNIT/ML ~~LOC~~ SOLN: 0.0000 [IU] | Freq: Three times a day (TID) | SUBCUTANEOUS | Status: DC

## 2015-03-05 MED ORDER — INSULIN ASPART 100 UNIT/ML ~~LOC~~ SOLN
0.0000 [IU] | Freq: Every day | SUBCUTANEOUS | Status: DC
  Administered 2015-03-05: 3 [IU] via SUBCUTANEOUS

## 2015-03-05 MED ORDER — SODIUM POLYSTYRENE SULFONATE 15 GM/60ML PO SUSP
30.0000 g | Freq: Once | ORAL | Status: AC
  Administered 2015-03-05: 30 g via ORAL
  Filled 2015-03-05: qty 120

## 2015-03-05 MED ORDER — MAGNESIUM SULFATE 50 % IJ SOLN
INTRAVENOUS | Status: AC
  Administered 2015-03-05 (×2): via INTRAVENOUS
  Filled 2015-03-05: qty 3

## 2015-03-05 NOTE — Progress Notes (Signed)
Chaplain visited patient at the request of a nurse. Chaplain introduced self and begin to show caring concern for patient.  Chaplain  visit was interrupted due to a phone call. Chaplain will follow up.    03/05/15 1000  Clinical Encounter Type  Visited With Patient  Visit Type Initial;Spiritual support;Social support  Referral From Nurse

## 2015-03-05 NOTE — Progress Notes (Signed)
Thankfully, his calcium is now down 11.4. Hopefully, the change in his medications for hypercalcemia have helped.  He now is on the third floor. He has restarted chemotherapy with VD-PACE.  This regimen worked very well right to his autologous stem cell transplant. I thought that it would now working again.  He is doing well with it. He feels okay. He's had no problems with nausea or vomiting. He's had no cough. He's had no hemoptysis. There is no increased shortness of breath. He is walking without difficulty.  His pain sees be under fairly good control.  his potassium is quite high. iit is 5.9. I will have pharmacy take potassium at his IV fluid. I don't think he needs anything to  Help get rid of the potassium right now.  He's had no diarrhea. He's had no urinary issues. He's had no leg swelling. He's had no rashes.  on his physical exam, his vital signs are all stable. Blood pressure 120/88. Temperature 98.2.oral exam shows no mucositis. He has no petechia. Lungs are clear. Cardiac exam regular rate and rhythm with no murmurs, rubs or bruits. Abdomen is soft. He has good bowel sounds. There is no fluid wave. There is no palpable liver or spleen tip. Extremities shows no clubbing, cyanosis or edema.  Again, I think everybody so much further incredibly diligent efforts in getting him down to 3 W. And get him started on chemotherapy yesterday. He and his wife are very appreciative of the efforts.   He will continue his chemotherapy for right now. If I would repeat his potassium this afternoon.  We need to get him on some mouth rinse for mucositis prevention.  [employer]idelity [id number]

## 2015-03-05 NOTE — Progress Notes (Signed)
Chemo dosages and calculations verified with Legrand Rams, RN

## 2015-03-05 NOTE — Progress Notes (Signed)
[facility] PROGRESS NOTE  Ladanian Kelter XKG:[id number] DOB: 06-14-1971 DOA: 02/27/2015 PCP: No PCP Per Patient  Brief narrative 44 y.o. male with PMH of multiple myeloma (status post chemotherapy and radiation and bone marrow transplant), GERD, CKD-II, who presented with hemoptysis. Workup suggestive of pneumonia versus alveolar hemorrhage. Also had hypercalcemia, thrombocytopenia . She started on started on chemotherapy with VD-PACE.  Assessment/Plan: Healthcare associated pneumonia with hemoptysis No further hemoptysis symptoms. Reports feeling better. Afebrile. Off antibiotics (completed 5 days course) Cultures negative so far. Appreciate pulmonary and ID recommendations.   Kappa IgG light chain multiple myeloma  s/p bone marrow transplantation 2015 at [facility], with relapse. Patient is on Daratumumab with Velcade and Decadron. Last SPEP/IFE demonstrated on 7/27 demonstrated increasing kappa free light chain and rapidly increasing kappa:lambda ratio. Bone marrow biopsy on 7/6 demonstrated 7% plasma cells, Beta-2 microglobulin is stable - Appreciate Dr. Marin Olp assistance - Continue famiciclovir prophylaxis - Started on emergent VD-PACE per Dr. Marin Olp on 8/3.  AoCKD-III:  Resolved with IV fluids.  Hyperkalemia Potassium supplement in fluid discontinued. Ordered a dose of Kayexalate. Stable on monitor. Repeat potassium in the morning.  RLQ pain Gaseous versus secondary to hypercalcemia. Now resolved.  GERD (gastroesophageal reflux disease) due to H. Pylori based on EGD biopsy from 7/8 - increased protonix to BID - Start triple therapy at discharge: PPI, amox 1gm daily, and clarithromycin 566m BID x 14 days   Normocytic anemia, due MM and chemotherapy - Adequate iron levels on last bone marrow biopsy. hemoglobin stable, near baseline of 10-133mdl    Thrombocytopenia -Possibly due to marrow suppression with chemotherapy - Transfused platelets 8/2. Platelets  improving.  Hypercalcemia  -due to MM and dehydration, being treated with zometa, last dose 7/27 - Not taking any calcium or vitamin D supplements - Calcium Trended up despite IVF and recent zometa, did not help with calcitonin (went further up), increased IV fluids and added Lasix and additional dose of Zometa but calcium kept on moving up so was started on urgent chemotherapy. Improved this morning.  Abnormal EKG:   diffused ST elevation (Lead I, and V3-V6) and PR segment elevation in aVR. These findings are consistent with possible pericarditis. - ECHO done: Moderate concentric hypertrophy with normal EF and no wall motion abnl or pericardial effusion. Stable on telemetry.   Diet: regular  DVT prophylaxis: SCDs   Code Status: full Family Communication: None at bedside Disposition Plan: Pending feeling better, calcium trending down   Consultants:  Pulmonology  ID  Dr. EnMarin OlpProcedures:  CT chest  Antibiotics:  vanc 7/29 >  ceftaz 7/29 >   Azithromycin 7/31 >   HPI/Subjective: Patient's is much better today. Denies hemoptysis, shortness of breath or abdominal pain.  Objective: Filed Vitals:   03/05/15 0628  BP: 120/88  Pulse: 103  Temp: 98.2 F (36.8 C)  Resp: 20    Intake/Output Summary (Last 24 hours) at 03/05/15 1238 Last data filed at 03/05/15 0615  Gross per 24 hour  Intake    480 ml  Output   2450 ml  Net  -1970 ml   Filed Weights   02/27/15 2031  Weight: 89.812 kg (198 lb)    Exam:   General:  Middle aged male in no acute distress  HEENT: Her present, moist oral mucosa, supple neck  Chest: Fine right basilar crackles, no rhonchi or wheeze  CVS: Normal S1 and S2, no murmurs or gallop  GI: Soft, nondistended, nontender, bowel sounds present  Musculoskeletal: Warm, no edema  CNS: Alert and oriented  Data Reviewed: Basic Metabolic Panel:  Recent Labs Lab 03/01/15 1400 03/02/15 0500 03/03/15 0342 03/04/15 0410  03/05/15 0541  NA 136 137 135 134* 130*  K 4.3 4.6 5.1 5.1 5.9*  CL 99* 101 101 101 101  CO2 '30 28 29 28 24  ' GLUCOSE 104* 162* 191* 120* 228*  BUN '16 16 13 12 19  ' CREATININE 0.94 1.15 0.94 0.94 0.98  CALCIUM 11.4* 11.7* 12.0* 13.9* 11.4*  MG 2.1  --   --   --   --   PHOS 2.4*  --   --  3.1 1.3*   Liver Function Tests:  Recent Labs Lab 02/28/15 0122 03/01/15 1400 03/02/15 0500 03/04/15 0410 03/05/15 0541  AST 31 33 32  --   --   ALT 42 42 39  --   --   ALKPHOS 72 76 75  --   --   BILITOT 1.8* 1.5* 1.2  --   --   PROT 6.7 6.9 6.8  --   --   ALBUMIN 3.7 3.7  3.7 3.7 3.8 4.0    Recent Labs Lab 03/01/15 1400  LIPASE 21*   No results for input(s): AMMONIA in the last 168 hours. CBC:  Recent Labs Lab 02/27/15 1808 02/28/15 0122 03/01/15 1400 03/02/15 0500 03/04/15 0400 03/05/15 0541  WBC 6.1 5.6 5.1 4.6 5.4 5.7  NEUTROABS 5.0  --   --  3.5  --   --   HGB 9.9* 10.0* 10.4* 10.4* 10.9* 9.8*  HCT 30.0* 30.4* 30.6* 30.4* 32.9* 30.3*  MCV 94.9 95.3 93.9 94.1 92.4 92.1  PLT 70* 71* 62* 64* 114* 102*   Cardiac Enzymes: No results for input(s): CKTOTAL, CKMB, CKMBINDEX, TROPONINI in the last 168 hours. BNP (last 3 results) No results for input(s): BNP in the last 8760 hours.  ProBNP (last 3 results) No results for input(s): PROBNP in the last 8760 hours.  CBG:  Recent Labs Lab 03/01/15 2202 03/02/15 0811 03/02/15 1218 03/02/15 1721 03/03/15 0743  GLUCAP 120* 121* 131* 109* 86    Recent Results (from the past 240 hour(s))  Culture, blood (routine x 2) Call MD if unable to obtain prior to antibiotics being given     Status: None (Preliminary result)   Collection Time: 02/28/15  1:20 AM  Result Value Ref Range Status   Specimen Description BLOOD LEFT HAND  Final   Special Requests BOTTLES DRAWN AEROBIC ONLY 5CC  Final   Culture   Final    NO GROWTH 4 DAYS Performed at [facility]    Report Status PENDING  Incomplete  Culture, blood (routine x  2) Call MD if unable to obtain prior to antibiotics being given     Status: None (Preliminary result)   Collection Time: 02/28/15  1:22 AM  Result Value Ref Range Status   Specimen Description BLOOD LEFT ANTECUBITAL  Final   Special Requests BOTTLES DRAWN AEROBIC ONLY 5CC  Final   Culture   Final    NO GROWTH 4 DAYS Performed at [facility]    Report Status PENDING  Incomplete  Culture, sputum-assessment     Status: None   Collection Time: 03/01/15  2:10 PM  Result Value Ref Range Status   Specimen Description SPUTUM  Final   Special Requests NONE  Final   Sputum evaluation   Final    MICROSCOPIC FINDINGS SUGGEST THAT THIS SPECIMEN IS NOT REPRESENTATIVE OF LOWER RESPIRATORY SECRETIONS. PLEASE RECOLLECT. NOTIFIED Rollene Rotunda  AT [place] ON [id number] BY HOOKER,B    Report Status 03/01/2015 FINAL  Final     Studies: No results found.  Scheduled Meds: . antiseptic oral rinse  15 mL Mouth Rinse 6 X Daily  . CISplatin with cyclophosphamide and etoposide chemo infusion   Intravenous Once  . CISplatin with cyclophosphamide and etoposide chemo infusion   Intravenous Once  . dexamethasone  40 mg Oral Once  . dextromethorphan-guaiFENesin  1 tablet Oral BID  . D5-1/2NS + KCL + MG +/- MANNITOL IV CISplatin hydration   Intravenous Once  . DOXOrubicin (ADRIAMYCIN) CHEMO for Inpatient continous infusion  10 mg/m2 (Treatment Plan Actual) Intravenous Once  . DOXOrubicin (ADRIAMYCIN) CHEMO for Inpatient continous infusion  10 mg/m2 (Treatment Plan Actual) Intravenous Once  . famciclovir  500 mg Oral Daily  . fentaNYL  75 mcg Transdermal Q72H  . gabapentin  600 mg Oral TID  . montelukast  10 mg Oral QHS  . pantoprazole  40 mg Oral BID AC  . saccharomyces boulardii  250 mg Oral BID  . sodium bicarbonate/sodium chloride   Mouth Rinse QID  . sodium chloride  10-40 mL Intracatheter Q12H  . sodium chloride  3 mL Intravenous Q12H  . sodium phosphate  Dextrose 5% IVPB  10 mmol Intravenous Once    Continuous Infusions: . sodium chloride 10 mL/hr at 03/04/15 1625  . D5-1/2NS + KCL + MG +/- MANNITOL IV CISplatin hydration 42 mL/hr at 03/05/15 0806      Time spent: 25 minutes    Jennie Hannay, [place]  [facility] Pager [phone]. If 7PM-7AM, please contact night-coverage at [email or web address], password [facility] 03/05/2015, 12:38 PM  LOS: 6 days

## 2015-03-06 ENCOUNTER — Inpatient Hospital Stay (HOSPITAL_COMMUNITY): Payer: BLUE CROSS/BLUE SHIELD

## 2015-03-06 DIAGNOSIS — N179 Acute kidney failure, unspecified: Secondary | ICD-10-CM | POA: Insufficient documentation

## 2015-03-06 LAB — GLUCOSE, CAPILLARY
GLUCOSE-CAPILLARY: 257 mg/dL — AB (ref 65–99)
GLUCOSE-CAPILLARY: 161 mg/dL — AB (ref 65–99)
Glucose-Capillary: 203 mg/dL — ABNORMAL HIGH (ref 65–99)
Glucose-Capillary: 75 mg/dL (ref 65–99)

## 2015-03-06 LAB — RENAL FUNCTION PANEL
ANION GAP: 4 — AB (ref 5–15)
Albumin: 4.1 g/dL (ref 3.5–5.0)
BUN: 23 mg/dL — ABNORMAL HIGH (ref 6–20)
CO2: 23 mmol/L (ref 22–32)
Calcium: 9.9 mg/dL (ref 8.9–10.3)
Chloride: 105 mmol/L (ref 101–111)
Creatinine, Ser: 1.25 mg/dL — ABNORMAL HIGH (ref 0.61–1.24)
GFR calc non Af Amer: >60 mL/min (ref >60)
Glucose, Bld: 306 mg/dL — ABNORMAL HIGH (ref 65–99)
POTASSIUM: 5.2 mmol/L — AB (ref 3.5–5.1)
Phosphorus: 2 mg/dL — ABNORMAL LOW (ref 2.5–4.6)
SODIUM: 132 mmol/L — AB (ref 135–145)

## 2015-03-06 LAB — CBC
HEMATOCRIT: 27.2 % — AB (ref 39.0–52.0)
Hemoglobin: 9.3 g/dL — ABNORMAL LOW (ref 13.0–17.0)
MCH: 31.7 pg (ref 26.0–34.0)
MCHC: 34.2 g/dL (ref 30.0–36.0)
MCV: 92.8 fL (ref 78.0–100.0)
PLATELETS: 80 10*3/uL — AB (ref 150–400)
RBC: 2.93 MIL/uL — AB (ref 4.22–5.81)
RDW: 14.6 % (ref 11.5–15.5)
WBC: 9.6 10*3/uL (ref 4.0–10.5)

## 2015-03-06 MED ORDER — SODIUM CHLORIDE 0.9 % IV SOLN
Freq: Once | INTRAVENOUS | Status: AC
  Administered 2015-03-06: 1085 mg via INTRAVENOUS
  Filled 2015-03-06 (×2): qty 21

## 2015-03-06 MED ORDER — SODIUM CHLORIDE 0.9 % IV SOLN
INTRAVENOUS | Status: AC
  Administered 2015-03-06 – 2015-03-07 (×2): via INTRAVENOUS

## 2015-03-06 MED ORDER — DEXAMETHASONE 6 MG PO TABS
40.0000 mg | ORAL_TABLET | Freq: Once | ORAL | Status: AC
  Administered 2015-03-06: 40 mg via ORAL
  Filled 2015-03-06: qty 1

## 2015-03-06 MED ORDER — SODIUM POLYSTYRENE SULFONATE 15 GM/60ML PO SUSP
15.0000 g | Freq: Once | ORAL | Status: AC
Start: 2015-03-06 — End: 2015-03-07
  Administered 2015-03-07: 15 g via ORAL
  Filled 2015-03-06: qty 60

## 2015-03-06 MED ORDER — DOXORUBICIN HCL CHEMO IV INJECTION 2 MG/ML
10.0000 mg/m2 | Freq: Once | INTRAVENOUS | Status: AC
  Administered 2015-03-06: 22 mg via INTRAVENOUS
  Filled 2015-03-06 (×2): qty 11

## 2015-03-06 MED ORDER — MAGNESIUM SULFATE 50 % IJ SOLN
INTRAVENOUS | Status: DC
  Filled 2015-03-06: qty 3

## 2015-03-06 MED ORDER — INSULIN GLARGINE 100 UNIT/ML ~~LOC~~ SOLN
10.0000 [IU] | Freq: Every day | SUBCUTANEOUS | Status: DC
  Administered 2015-03-06: 10 [IU] via SUBCUTANEOUS
  Filled 2015-03-06 (×2): qty 0.1

## 2015-03-06 MED ORDER — PALONOSETRON HCL INJECTION 0.25 MG/5ML
0.2500 mg | Freq: Once | INTRAVENOUS | Status: AC
  Administered 2015-03-06: 0.25 mg via INTRAVENOUS
  Filled 2015-03-06: qty 5

## 2015-03-06 NOTE — Progress Notes (Signed)
[facility] PROGRESS NOTE  Neil Brickell QQV:[id number] DOB: [birth year] DOA: 02/27/2015 PCP: No PCP Per Patient  Brief narrative 44 y.o. male with PMH of multiple myeloma (status post chemotherapy and radiation and bone marrow transplant), GERD, CKD-II, who presented with hemoptysis. Workup suggestive of pneumonia versus alveolar hemorrhage. Also had hypercalcemia, thrombocytopenia . She started on started on chemotherapy with VD-PACE.  Assessment/Plan: Healthcare associated pneumonia with hemoptysis No further hemoptysis symptoms and feeling better.  Afebrile. Off antibiotics (completed 5 days course) Cultures negative so far. Appreciate pulmonary and ID recommendations.   Kappa IgG light chain multiple myeloma  s/p bone marrow transplantation 2015 at [facility], with relapse. Patient is on Daratumumab with Velcade and Decadron. Last SPEP/IFE demonstrated on 7/27 demonstrated increasing kappa free light chain and rapidly increasing kappa:lambda ratio. Bone marrow biopsy on 7/6 demonstrated 7% plasma cells, Beta-2 microglobulin is stable. - Dr. Marin Olp following. - Continue famiciclovir prophylaxis. - Started on emergent VD-PACE r on 8/3.  AoCKD-III:  Resolved with IV fluids.  Hyperkalemia Potassium supplement in fluid discontinued. Received a dose of Kayexalate on 8/4. Stable on monitor. Repeat potassium this morning again shows mildly elevated level. Will order another dose of Kayexalate today.  RLQ pain Gaseous versus secondary to hypercalcemia. Now resolved.  GERD (gastroesophageal reflux disease) due to H. Pylori based on EGD biopsy from 7/8 - increased protonix to BID - Start triple therapy at discharge for H pylori   Normocytic anemia, due MM and chemotherapy - Adequate iron levels on last bone marrow biopsy. hemoglobin stable, near baseline of 10-82m/dl    Thrombocytopenia -Possibly due to marrow suppression with chemotherapy - Transfused platelets 8/2.  Monitor daily.  Hypercalcemia  -due to MM and dehydration, being treated with zometa, last dose 7/27 - Not taking any calcium or vitamin D supplements - Calcium Trended up despite IVF and recent zometa, did not help with calcitonin (went further up), increased IV fluids and added Lasix and additional dose of Zometa but calcium kept on moving up so was started on urgent chemotherapy. Improved to normal this morning.  Hyperglycemia Appears to be steroid-induced.  Also getting D5 fluids which has been discontinued. Will place him on low-dose Lantus . Continue sliding scale insulin. Added normal saline infusion.  Abnormal EKG:   diffused ST elevation (Lead I, and V3-V6) and PR segment elevation in aVR. Findings appeared consistent with possible pericarditis. - ECHO done: Moderate concentric hypertrophy with normal EF and no wall motion abnl or pericardial effusion. Stable on telemetry and asymptomatic.  Mild acute kidney injury Noted this morning. Monitor with IV hydration.      Diet: regular  DVT prophylaxis: SCDs   Code Status: full Family Communication:wife at bedside Disposition Plan: Pending completion of chemotherapy (per Dr.Ennever.  he will be following patient on Sunday)   Consultants:  Pulmonology  ID  Dr. EMarin Olp Procedures:  CT chest  Antibiotics:  Completed course of vanco, ceftaz and azithromycin   HPI/Subjective: Patient's denies any symptoms today. Feels better  Objective: Filed Vitals:   03/06/15 0519  BP: 141/91  Pulse: 85  Temp: 98.3 F (36.8 C)  Resp: 16    Intake/Output Summary (Last 24 hours) at 03/06/15 1121 Last data filed at 03/06/15 0540  Gross per 24 hour  Intake 2040.9 ml  Output   2700 ml  Net -659.1 ml   Filed Weights   02/27/15 2031  Weight: 89.812 kg (198 lb)    Exam:   General:  Middle aged male in no acute  distress  HEENT: pallor present, moist oral mucosa, supple neck  Chest: clear b/l , no rhonchi or  wheeze  CVS: Normal S1 and S2, no murmurs or gallop  GI: Soft, nondistended, nontender, bowel sounds present  Musculoskeletal: Warm, no edema    Data Reviewed: Basic Metabolic Panel:  Recent Labs Lab 03/01/15 1400  03/03/15 0342 03/04/15 0410 03/05/15 0541 03/05/15 1435 03/06/15 0515  NA 136  < > 135 134* 130* 131* 132*  K 4.3  < > 5.1 5.1 5.9* 5.5* 5.2*  CL 99*  < > 101 101 101 101 105  CO2 30  < > '29 28 24 22 23  ' GLUCOSE 104*  < > 191* 120* 228* 295* 306*  BUN 16  < > '13 12 19 ' 21* 23*  CREATININE 0.94  < > 0.94 0.94 0.98 0.95 1.25*  CALCIUM 11.4*  < > 12.0* 13.9* 11.4* 10.7* 9.9  MG 2.1  --   --   --   --   --   --   PHOS 2.4*  --   --  3.1 1.3*  --  2.0*  < > = values in this interval not displayed. Liver Function Tests:  Recent Labs Lab 02/28/15 0122 03/01/15 1400 03/02/15 0500 03/04/15 0410 03/05/15 0541 03/06/15 0515  AST 31 33 32  --   --   --   ALT 42 42 39  --   --   --   ALKPHOS 72 76 75  --   --   --   BILITOT 1.8* 1.5* 1.2  --   --   --   PROT 6.7 6.9 6.8  --   --   --   ALBUMIN 3.7 3.7  3.7 3.7 3.8 4.0 4.1    Recent Labs Lab 03/01/15 1400  LIPASE 21*   No results for input(s): AMMONIA in the last 168 hours. CBC:  Recent Labs Lab 02/27/15 1808  03/01/15 1400 03/02/15 0500 03/04/15 0400 03/05/15 0541 03/06/15 0515  WBC 6.1  < > 5.1 4.6 5.4 5.7 9.6  NEUTROABS 5.0  --   --  3.5  --   --   --   HGB 9.9*  < > 10.4* 10.4* 10.9* 9.8* 9.3*  HCT 30.0*  < > 30.6* 30.4* 32.9* 30.3* 27.2*  MCV 94.9  < > 93.9 94.1 92.4 92.1 92.8  PLT 70*  < > 62* 64* 114* 102* 80*  < > = values in this interval not displayed. Cardiac Enzymes: No results for input(s): CKTOTAL, CKMB, CKMBINDEX, TROPONINI in the last 168 hours. BNP (last 3 results) No results for input(s): BNP in the last 8760 hours.  ProBNP (last 3 results) No results for input(s): PROBNP in the last 8760 hours.  CBG:  Recent Labs Lab 03/02/15 1721 03/03/15 0743 03/05/15 1649  03/05/15 2239 03/06/15 0730  GLUCAP 109* 86 274* 268* 257*    Recent Results (from the past 240 hour(s))  Culture, blood (routine x 2) Call MD if unable to obtain prior to antibiotics being given     Status: None   Collection Time: 02/28/15  1:20 AM  Result Value Ref Range Status   Specimen Description BLOOD LEFT HAND  Final   Special Requests BOTTLES DRAWN AEROBIC ONLY 5CC  Final   Culture   Final    NO GROWTH 5 DAYS Performed at [facility]    Report Status 03/05/2015 FINAL  Final  Culture, blood (routine x 2) Call MD if unable to obtain  prior to antibiotics being given     Status: None   Collection Time: 02/28/15  1:22 AM  Result Value Ref Range Status   Specimen Description BLOOD LEFT ANTECUBITAL  Final   Special Requests BOTTLES DRAWN AEROBIC ONLY 5CC  Final   Culture   Final    NO GROWTH 5 DAYS Performed at [facility]    Report Status 03/05/2015 FINAL  Final  Culture, sputum-assessment     Status: None   Collection Time: 03/01/15  2:10 PM  Result Value Ref Range Status   Specimen Description SPUTUM  Final   Special Requests NONE  Final   Sputum evaluation   Final    MICROSCOPIC FINDINGS SUGGEST THAT THIS SPECIMEN IS NOT REPRESENTATIVE OF LOWER RESPIRATORY SECRETIONS. PLEASE RECOLLECT. NOTIFIED JOHNSON,A AT 1610 ON [id number] BY HOOKER,B    Report Status 03/01/2015 FINAL  Final     Studies: Dg Chest 2 View  03/06/2015   CLINICAL DATA:  Pneumonia.  EXAM: CHEST  2 VIEW  COMPARISON:  03/03/2015, 03/02/2015.  CT 02/27/2015.  FINDINGS: Power port catheter noted in good anatomic position. Left PICC line noted with tip projected over cavoatrial junction. Mediastinum hilar structures are normal. Heart size normal. Interim near complete clearing of right upper lobe infiltrate. Left pleural base mass again noted.  IMPRESSION: 1. Power port catheter and PICC line in good anatomic position . 2. Interim near complete clearing of right upper lobe infiltrate. Persistent  left lung pleural based mass again noted.   Electronically Signed   By: Marcello Moores  Register   On: 03/06/2015 09:45    Scheduled Meds: . antiseptic oral rinse  15 mL Mouth Rinse 6 X Daily  . CISplatin with cyclophosphamide and etoposide chemo infusion   Intravenous Once  . CISplatin with cyclophosphamide and etoposide chemo infusion   Intravenous Once  . dexamethasone  40 mg Oral Once  . dextromethorphan-guaiFENesin  1 tablet Oral BID  . DOXOrubicin (ADRIAMYCIN) CHEMO for Inpatient continous infusion  10 mg/m2 (Treatment Plan Actual) Intravenous Once  . DOXOrubicin (ADRIAMYCIN) CHEMO for Inpatient continous infusion  10 mg/m2 (Treatment Plan Actual) Intravenous Once  . famciclovir  500 mg Oral Daily  . fentaNYL  75 mcg Transdermal Q72H  . gabapentin  600 mg Oral TID  . insulin aspart  0-15 Units Subcutaneous TID WC  . insulin aspart  0-5 Units Subcutaneous QHS  . insulin aspart  6 Units Subcutaneous TID WC  . insulin glargine  10 Units Subcutaneous Daily  . montelukast  10 mg Oral QHS  . palonosetron  0.25 mg Intravenous Once  . pantoprazole  40 mg Oral BID AC  . saccharomyces boulardii  250 mg Oral BID  . sodium bicarbonate/sodium chloride   Mouth Rinse QID  . sodium chloride  10-40 mL Intracatheter Q12H  . sodium chloride  3 mL Intravenous Q12H   Continuous Infusions: . sodium chloride 10 mL/hr at 03/04/15 1625  . sodium chloride    . D5-1/2NS + KCL + MG +/- MANNITOL IV CISplatin hydration        Time spent: 25 minutes    Allessandra Bernardi, [place]  [facility] Pager [phone]. If 7PM-7AM, please contact night-coverage at [email or web address], password [facility] 03/06/2015, 11:21 AM  LOS: 7 days

## 2015-03-06 NOTE — Care Management Note (Signed)
Case Management Note  Patient Details  Name: Mark [name] MRN: [id number] Date of Birth: 10/19/1970  Subjective/Objective:  Chemo, ivf@100 , fentanyl iv, insulin.Noted electrolytes abnormal.Oncology/medicine following.                  Action/Plan:d/c plan home.   Expected Discharge Date:   (unknown)               Expected Discharge Plan:  Home/Self Care  In-House Referral:     Discharge planning Services  CM Consult  Post Acute Care Choice:    Choice offered to:     DME Arranged:    DME Agency:     HH Arranged:    HH Agency:     Status of Service:  In process, will continue to follow  Medicare Important Message Given:    Date Medicare IM Given:    Medicare IM give by:    Date Additional Medicare IM Given:    Additional Medicare Important Message give by:     If discussed at [place] of Stay Meetings, dates discussed:    Additional Comments:  Dessa Phi, RN 03/06/2015, 10:22 AM

## 2015-03-06 NOTE — Progress Notes (Signed)
Mark [name] is doing quite well. He feels better. I am just very happy that things have improved so quickly. His calcium is now down to 9.9. His potassium is down to 5.2. He has not had any significant nausea or vomiting. He's found to be proactive with this.  He is eating okay.  He's had no issues with diarrhea.   There's been no bleeding. He's had no hemoptysis. There is no shortness of breath.  He's had no leg swelling.  He's  Walking quite a bit.  His appetite has been pretty good.   on his physical exam, his vital signs are temperature of 98.3. Pulse 85. Blood pressure 141/91. Head and neck exam shows no ocular or oral lesions. There are no palpable cervical or supraclavicular lymph nodes. Lungs are clear. Cardiac exam regular rate and rhythm with no murmurs, rubs or bruits. Abdomen is soft. He has good bowel sounds. There is no fluid wave. There is no palpable hepatosplenomegaly.extremities shows no clubbing, cyanosis or edema.  Is labs show a sodium 132. Potassium 5.2. Blood sugars 306. Phosphorus 2.0. Creatinine 1.25. White cell count 9.6. Hemoglobin 9.3. Platelet count 80,000.  His chemotherapy is going well. He's had no problems with that yet.  His blood sugars are quite high. I think this because the steroids. He is on a sliding scale insulin now. His creatinine is up a little bit. We will have to be careful with this.  I do appreciate all the great care that he is getting from everybody on [place] 3:19

## 2015-03-07 DIAGNOSIS — N179 Acute kidney failure, unspecified: Secondary | ICD-10-CM

## 2015-03-07 DIAGNOSIS — E875 Hyperkalemia: Secondary | ICD-10-CM

## 2015-03-07 DIAGNOSIS — R739 Hyperglycemia, unspecified: Secondary | ICD-10-CM

## 2015-03-07 LAB — GLUCOSE, CAPILLARY
GLUCOSE-CAPILLARY: 204 mg/dL — AB (ref 65–99)
Glucose-Capillary: 158 mg/dL — ABNORMAL HIGH (ref 65–99)
Glucose-Capillary: 167 mg/dL — ABNORMAL HIGH (ref 65–99)
Glucose-Capillary: 140 mg/dL — ABNORMAL HIGH (ref 65–99)

## 2015-03-07 LAB — BASIC METABOLIC PANEL
ANION GAP: 7 (ref 5–15)
BUN: 26 mg/dL — ABNORMAL HIGH (ref 6–20)
CHLORIDE: 109 mmol/L (ref 101–111)
CO2: 19 mmol/L — ABNORMAL LOW (ref 22–32)
Calcium: 9.4 mg/dL (ref 8.9–10.3)
Creatinine, Ser: 1.16 mg/dL (ref 0.61–1.24)
GFR calc Af Amer: >60 mL/min (ref >60)
Glucose, Bld: 309 mg/dL — ABNORMAL HIGH (ref 65–99)
Potassium: 5 mmol/L (ref 3.5–5.1)
Sodium: 135 mmol/L (ref 135–145)

## 2015-03-07 LAB — RENAL FUNCTION PANEL
Albumin: 3.4 g/dL — ABNORMAL LOW (ref 3.5–5.0)
Anion gap: 7 (ref 5–15)
BUN: 26 mg/dL — ABNORMAL HIGH (ref 6–20)
CO2: 20 mmol/L — ABNORMAL LOW (ref 22–32)
CREATININE: 1.19 mg/dL (ref 0.61–1.24)
Calcium: 9.5 mg/dL (ref 8.9–10.3)
Chloride: 109 mmol/L (ref 101–111)
GFR calc Af Amer: >60 mL/min (ref >60)
GFR calc non Af Amer: >60 mL/min (ref >60)
GLUCOSE: 310 mg/dL — AB (ref 65–99)
PHOSPHORUS: 1.9 mg/dL — AB (ref 2.5–4.6)
POTASSIUM: 5 mmol/L (ref 3.5–5.1)
Sodium: 136 mmol/L (ref 135–145)

## 2015-03-07 LAB — CBC
HCT: 24.2 % — ABNORMAL LOW (ref 39.0–52.0)
HEMOGLOBIN: 8 g/dL — AB (ref 13.0–17.0)
MCH: 30.5 pg (ref 26.0–34.0)
MCHC: 33.1 g/dL (ref 30.0–36.0)
MCV: 92.4 fL (ref 78.0–100.0)
Platelets: 56 10*3/uL — ABNORMAL LOW (ref 150–400)
RBC: 2.62 MIL/uL — AB (ref 4.22–5.81)
RDW: 15 % (ref 11.5–15.5)
WBC: 7.9 10*3/uL (ref 4.0–10.5)

## 2015-03-07 LAB — MAGNESIUM: MAGNESIUM: 2 mg/dL (ref 1.7–2.4)

## 2015-03-07 MED ORDER — INSULIN GLARGINE 100 UNIT/ML ~~LOC~~ SOLN
15.0000 [IU] | Freq: Two times a day (BID) | SUBCUTANEOUS | Status: DC
  Administered 2015-03-07 – 2015-03-08 (×3): 15 [IU] via SUBCUTANEOUS
  Filled 2015-03-07 (×5): qty 0.15

## 2015-03-07 MED ORDER — FUROSEMIDE 10 MG/ML IJ SOLN
40.0000 mg | Freq: Once | INTRAMUSCULAR | Status: DC
  Filled 2015-03-07: qty 4

## 2015-03-07 MED ORDER — BORTEZOMIB CHEMO IV INJECTION 3.5 MG
1.3000 mg/m2 | Freq: Once | INTRAMUSCULAR | Status: AC
  Administered 2015-03-07: 2.8 mg via INTRAVENOUS
  Filled 2015-03-07: qty 2.8

## 2015-03-07 MED ORDER — SODIUM CHLORIDE 0.9 % IV SOLN
INTRAVENOUS | Status: AC
  Administered 2015-03-07 – 2015-03-08 (×3): via INTRAVENOUS

## 2015-03-07 MED ORDER — SODIUM CHLORIDE 0.9 % IV SOLN
Freq: Once | INTRAVENOUS | Status: AC
  Administered 2015-03-07: 09:00:00 via INTRAVENOUS

## 2015-03-07 MED ORDER — FUROSEMIDE 10 MG/ML IJ SOLN
20.0000 mg | Freq: Once | INTRAMUSCULAR | Status: DC
  Filled 2015-03-07: qty 2

## 2015-03-07 MED ORDER — CALCITONIN (SALMON) 200 UNIT/ML IJ SOLN: 100.0000 [IU] | Freq: Every day | INTRAMUSCULAR | Status: DC

## 2015-03-07 MED ORDER — DEXAMETHASONE 6 MG PO TABS
40.0000 mg | ORAL_TABLET | Freq: Once | ORAL | Status: AC
  Administered 2015-03-07: 40 mg via ORAL
  Filled 2015-03-07: qty 1

## 2015-03-07 MED ORDER — SODIUM CHLORIDE 0.9 % IV SOLN
Freq: Once | INTRAVENOUS | Status: AC
  Administered 2015-03-07: 21 mg via INTRAVENOUS
  Filled 2015-03-07: qty 21

## 2015-03-07 MED ORDER — SODIUM CHLORIDE 0.9 % IV SOLN
10.0000 mg/m2 | Freq: Once | INTRAVENOUS | Status: AC
  Administered 2015-03-07: 22 mg via INTRAVENOUS
  Filled 2015-03-07: qty 11

## 2015-03-07 NOTE — Progress Notes (Signed)
[facility] PROGRESS NOTE Interim History: 44 y.o. male with PMH of multiple myeloma (status post chemotherapy and radiation and bone marrow transplant), GERD, CKD-II, who presented with hemoptysis. Workup suggestive of pneumonia versus alveolar hemorrhage. Also had hypercalcemia, thrombocytopenia . She started on started on chemotherapy with VD-PACE.   Assessment/Plan: Health care associated pneumonia with hemoptysis: Hemoptysis was resolved he is feeling better has remained afebrile off antibiotics. Culture negative.  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 Appreciate oncology's assistance.  Patient is on Daratumumab with Velcade and Decadron. - Continue acyclovir prophylaxis. - Started emergently on VD-PACE r on 8/3.  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  Hyperkalemia: - Improved with Kayexalate.  Normocytic anemia/thrombocytopenia due to multiple myeloma and chemotherapy:  Hemoglobin has remained stable. Oncology recommended to get 2 units of packed red blood cells. Check a CBC posttransfusion.  Hyperglycemia:  Continue Lantus plus sliding scale and continue to titrate the Lantus.   Code Status: full Family Communication: none Disposition Plan: inpatient   Consultants:  Pulmonology  ID  Dr. Marin Olp  Procedures:  Ct chest  Antibiotics:  Completed course of antibitoics  HPI/Subjective: Feels better has no new complaints. Agreed to take Kayexalate.  Objective: Filed Vitals:   03/06/15 0519 03/06/15 1337 03/07/15 0540 03/07/15 0542  BP: 141/91 122/76 154/101 106/71  Pulse: 85 92 74 112  Temp: 98.3 F (36.8 C) 98.2 F (36.8 C) 97.9 F (36.6 C) 98.5 F (36.9 C)  TempSrc: Oral Oral Oral Oral  Resp: '16 18 16 16  ' Height:      Weight:      SpO2: 99% 98% 98% 91%    Intake/Output Summary (Last 24 hours) at 03/07/15 0847 Last data filed at 03/07/15 0543  Gross  per 24 hour  Intake    850 ml  Output   2805 ml  Net  -1955 ml   Filed Weights   02/27/15 2031  Weight: 89.812 kg (198 lb)    Exam:  General: Alert, awake, oriented x3, in no acute distress.  HEENT: No bruits, no goiter.  Heart: Regular rate and rhythm. Lungs: Good air movement, clear Abdomen: Soft, nontender, nondistended, positive bowel sounds.  Neuro: Grossly intact, nonfocal.   Data Reviewed: Basic Metabolic Panel:  Recent Labs Lab 03/01/15 1400  03/04/15 0410 03/05/15 0541 03/05/15 1435 03/06/15 0515 03/07/15 0500  NA 136  < > 134* 130* 131* 132* 136  135  K 4.3  < > 5.1 5.9* 5.5* 5.2* 5.0  5.0  CL 99*  < > 101 101 101 105 109  109  CO2 30  < > '28 24 22 23 ' 20*  19*  GLUCOSE 104*  < > 120* 228* 295* 306* 310*  309*  BUN 16  < > 12 19 21* 23* 26*  26*  CREATININE 0.94  < > 0.94 0.98 0.95 1.25* 1.19  1.16  CALCIUM 11.4*  < > 13.9* 11.4* 10.7* 9.9 9.5  9.4  MG 2.1  --   --   --   --   --  2.0  PHOS 2.4*  --  3.1 1.3*  --  2.0* 1.9*  < > = values in this interval not displayed. Liver Function Tests:  Recent Labs Lab 03/01/15 1400 03/02/15 0500 03/04/15 0410 03/05/15 0541 03/06/15 0515 03/07/15 0500  AST 33 32  --   --   --   --   ALT 42 39  --   --   --   --  ALKPHOS 76 75  --   --   --   --   BILITOT 1.5* 1.2  --   --   --   --   PROT 6.9 6.8  --   --   --   --   ALBUMIN 3.7  3.7 3.7 3.8 4.0 4.1 3.4*    Recent Labs Lab 03/01/15 1400  LIPASE 21*   No results for input(s): AMMONIA in the last 168 hours. CBC:  Recent Labs Lab 03/02/15 0500 03/04/15 0400 03/05/15 0541 03/06/15 0515 03/07/15 0500  WBC 4.6 5.4 5.7 9.6 7.9  NEUTROABS 3.5  --   --   --   --   HGB 10.4* 10.9* 9.8* 9.3* 8.0*  HCT 30.4* 32.9* 30.3* 27.2* 24.2*  MCV 94.1 92.4 92.1 92.8 92.4  PLT 64* 114* 102* 80* 56*   Cardiac Enzymes: No results for input(s): CKTOTAL, CKMB, CKMBINDEX, TROPONINI in the last 168 hours. BNP (last 3 results) No results for input(s): BNP  in the last 8760 hours.  ProBNP (last 3 results) No results for input(s): PROBNP in the last 8760 hours.  CBG:  Recent Labs Lab 03/06/15 0730 03/06/15 1155 03/06/15 1705 03/06/15 2143 03/07/15 0744  GLUCAP 257* 161* 203* 75 204*    Recent Results (from the past 240 hour(s))  Culture, blood (routine x 2) Call MD if unable to obtain prior to antibiotics being given     Status: None   Collection Time: 02/28/15  1:20 AM  Result Value Ref Range Status   Specimen Description BLOOD LEFT HAND  Final   Special Requests BOTTLES DRAWN AEROBIC ONLY 5CC  Final   Culture   Final    NO GROWTH 5 DAYS Performed at [facility]    Report Status 03/05/2015 FINAL  Final  Culture, blood (routine x 2) Call MD if unable to obtain prior to antibiotics being given     Status: None   Collection Time: 02/28/15  1:22 AM  Result Value Ref Range Status   Specimen Description BLOOD LEFT ANTECUBITAL  Final   Special Requests BOTTLES DRAWN AEROBIC ONLY 5CC  Final   Culture   Final    NO GROWTH 5 DAYS Performed at [facility]    Report Status 03/05/2015 FINAL  Final  Culture, sputum-assessment     Status: None   Collection Time: 03/01/15  2:10 PM  Result Value Ref Range Status   Specimen Description SPUTUM  Final   Special Requests NONE  Final   Sputum evaluation   Final    MICROSCOPIC FINDINGS SUGGEST THAT THIS SPECIMEN IS NOT REPRESENTATIVE OF LOWER RESPIRATORY SECRETIONS. PLEASE RECOLLECT. NOTIFIED JOHNSON,A AT 1093 ON [id number] BY HOOKER,B    Report Status 03/01/2015 FINAL  Final     Studies: Dg Chest 2 View  03/06/2015   CLINICAL DATA:  Pneumonia.  EXAM: CHEST  2 VIEW  COMPARISON:  03/03/2015, 03/02/2015.  CT 02/27/2015.  FINDINGS: Power port catheter noted in good anatomic position. Left PICC line noted with tip projected over cavoatrial junction. Mediastinum hilar structures are normal. Heart size normal. Interim near complete clearing of right upper lobe infiltrate. Left pleural  base mass again noted.  IMPRESSION: 1. Power port catheter and PICC line in good anatomic position . 2. Interim near complete clearing of right upper lobe infiltrate. Persistent left lung pleural based mass again noted.   Electronically Signed   By: Marcello Moores  Register   On: 03/06/2015 09:45    Scheduled Meds: .  sodium chloride   Intravenous Once  . antiseptic oral rinse  15 mL Mouth Rinse 6 X Daily  . bortezomib IV  1.3 mg/m2 (Treatment Plan Actual) Intravenous Once  . CISplatin with cyclophosphamide and etoposide chemo infusion   Intravenous Once  . CISplatin with cyclophosphamide and etoposide chemo infusion   Intravenous Once  . dexamethasone  40 mg Oral Once  . dextromethorphan-guaiFENesin  1 tablet Oral BID  . DOXOrubicin (ADRIAMYCIN) CHEMO for Inpatient continous infusion  10 mg/m2 (Treatment Plan Actual) Intravenous Once  . DOXOrubicin (ADRIAMYCIN) CHEMO for Inpatient continous infusion  10 mg/m2 (Treatment Plan Actual) Intravenous Once  . famciclovir  500 mg Oral Daily  . fentaNYL  75 mcg Transdermal Q72H  . furosemide  20 mg Intravenous Once  . gabapentin  600 mg Oral TID  . insulin aspart  0-15 Units Subcutaneous TID WC  . insulin aspart  0-5 Units Subcutaneous QHS  . insulin aspart  6 Units Subcutaneous TID WC  . insulin glargine  10 Units Subcutaneous Daily  . montelukast  10 mg Oral QHS  . pantoprazole  40 mg Oral BID AC  . saccharomyces boulardii  250 mg Oral BID  . sodium bicarbonate/sodium chloride   Mouth Rinse QID  . sodium chloride  10-40 mL Intracatheter Q12H  . sodium chloride  3 mL Intravenous Q12H  . sodium polystyrene  15 g Oral Once   Continuous Infusions: . sodium chloride 10 mL/hr at 03/04/15 1625    Time Spent: 25 min   Charlynne Cousins  [facility] Pager [phone]. If 7PM-7AM, please contact night-coverage at [email or web address], password [facility] 03/07/2015, 8:47 AM  LOS: 8 days

## 2015-03-07 NOTE — Progress Notes (Signed)
Mr. Stuber is doing quite well. He's done well with chemotherapy so far.  His chemotherapy is going well. I think he has another 24-hour infusion left. I think this probably starts today or tomorrow.  His calcium has been trending down very nicely. His calcium today was 9.5.  He is walking. His pain is much better controlled.  He's having no problems going to the bathroom. Per he's had no bleeding. There is no hemoptysis. He has no shortness of breath.  [name], his vital signs are stable. His blood pressure is 106/71. Pulse is 112. Lungs are clear. Cardiac exam tachycardic but regular. Abdomen is soft. Extremities shows no clubbing, cyanosis or edema.  His labs show his sodium of 136. Potassium 5.0. Calcium 9.5. His hemoglobin 8.0. Platelets of 56.  For now, we will continue to follow along. Again he'll get 2 units of blood. He will need this.  Mrs. there is no hemoptysis, I will go ahead and hold off on blood transfusion.  His blood sugars are quite high. He gets some extra glucose yesterday.  I very much appreciate all the great care that he is getting from everybody up on [address]. He is looking so much better.  Lum Keas  Colossians 3:1-2

## 2015-03-07 NOTE — Progress Notes (Signed)
Chemotherapy dosage calculations checked with Reyne Dumas, RN.

## 2015-03-08 DIAGNOSIS — M899 Disorder of bone, unspecified: Secondary | ICD-10-CM

## 2015-03-08 LAB — TYPE AND SCREEN
ABO/RH(D): B POS
Antibody Screen: POSITIVE
DAT, IGG: NEGATIVE
Unit division: 0

## 2015-03-08 LAB — GLUCOSE, CAPILLARY
GLUCOSE-CAPILLARY: 124 mg/dL — AB (ref 65–99)
GLUCOSE-CAPILLARY: 193 mg/dL — AB (ref 65–99)
Glucose-Capillary: 168 mg/dL — ABNORMAL HIGH (ref 65–99)
Glucose-Capillary: 63 mg/dL — ABNORMAL LOW (ref 65–99)
Glucose-Capillary: 77 mg/dL (ref 65–99)
Glucose-Capillary: 97 mg/dL (ref 65–99)
Glucose-Capillary: 125 mg/dL — ABNORMAL HIGH (ref 65–99)
Glucose-Capillary: 185 mg/dL — ABNORMAL HIGH (ref 65–99)

## 2015-03-08 LAB — CBC
HEMATOCRIT: 26.9 % — AB (ref 39.0–52.0)
HEMOGLOBIN: 9.3 g/dL — AB (ref 13.0–17.0)
MCH: 31.6 pg (ref 26.0–34.0)
MCHC: 34.6 g/dL (ref 30.0–36.0)
MCV: 91.5 fL (ref 78.0–100.0)
PLATELETS: 39 10*3/uL — AB (ref 150–400)
RBC: 2.94 MIL/uL — ABNORMAL LOW (ref 4.22–5.81)
RDW: 15.1 % (ref 11.5–15.5)
WBC: 6.4 10*3/uL (ref 4.0–10.5)

## 2015-03-08 LAB — RENAL FUNCTION PANEL
Albumin: 3.5 g/dL (ref 3.5–5.0)
Anion gap: 5 (ref 5–15)
BUN: 27 mg/dL — ABNORMAL HIGH (ref 6–20)
CALCIUM: 9 mg/dL (ref 8.9–10.3)
CO2: 22 mmol/L (ref 22–32)
CREATININE: 1.01 mg/dL (ref 0.61–1.24)
Chloride: 112 mmol/L — ABNORMAL HIGH (ref 101–111)
GFR calc Af Amer: >60 mL/min (ref >60)
GFR calc non Af Amer: >60 mL/min (ref >60)
GLUCOSE: 127 mg/dL — AB (ref 65–99)
POTASSIUM: 4.1 mmol/L (ref 3.5–5.1)
Phosphorus: 3 mg/dL (ref 2.5–4.6)
SODIUM: 139 mmol/L (ref 135–145)

## 2015-03-08 NOTE — Progress Notes (Signed)
Mr. Hagger continues to do well. He only got 1 unit of blood yesterday. I think that was okay.  His calcium is now down to 9.0. His potassiums 4.1.  This will be his last day of chemotherapy.  He's had no nausea or vomiting. He's had no cough. He's had no abdominal pain. He's been no diarrhea.  Overall, his pain has been under good control.  [name], his vital signs are all stable. Blood pressure 134/90. His lungs are clear. Cardiac exam regular rate and rhythm with no murmurs, rubs or bruits. Abdomen is soft. Bowel sounds are active. Extremities shows no clubbing, cyanosis or edema.  Again, he'll finish up his chemotherapy today. I would and watch and if all looks good today, then he can go home tomorrow. We will follow him up in the office and give him his Neulasta.  He is very thankful, as well as I, of the great care to he's getting on [address].  Pete E.  Romans 5:3-5

## 2015-03-08 NOTE — Progress Notes (Signed)
Hypoglycemic Event  CBG: 63  Treatment: 15 GM carbohydrate snack  Symptoms: None  Follow-up CBG: Time:0738 CBG Result:77  Possible Reasons for Event: Unknown  Comments/MD notified:    Joaquin Courts  Remember to initiate Hypoglycemia Order Set & complete

## 2015-03-08 NOTE — Progress Notes (Addendum)
[facility] PROGRESS NOTE Interim History: 44 y.o. male with PMH of multiple myeloma (status post chemotherapy and radiation and bone marrow transplant), GERD, CKD-II, who presented with hemoptysis. Workup suggestive of pneumonia versus alveolar hemorrhage. Also had hypercalcemia, thrombocytopenia . She started on started on chemotherapy with VD-PACE.   Assessment/Plan: Health care associated pneumonia with hemoptysis: Hemoptysis was resolved he is feeling better has remained afebrile off antibiotics. Previous therapy in house.  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 Appreciate oncology's assistance.  Patient is on Daratumumab with Velcade and Decadron. - Continue acyclovir prophylaxis. - Started emergently on VD-PACE r on 8/3. - Last day of chemotherapy today will watch overnight. Continue gentle IV fluid hydration.  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  Hyperkalemia: - Resolved with Kayexalate. - Check a be met in the morning.  Normocytic anemia/thrombocytopenia due to multiple myeloma and chemotherapy:  Hemoglobin has remained stable. CBC is now 9.3.  Hyperglycemia: Blood glucose continues to improve with titration of insulins. Continue to monitor.   Code Status: full Family Communication: none Disposition Plan: inpatient   Consultants:  Pulmonology  ID  Dr. Marin Olp  Procedures:  Ct chest  Antibiotics:  Completed course of antibitoics  HPI/Subjective: Feels better has no new complaints.   Objective: Filed Vitals:   03/07/15 1710 03/07/15 1918 03/07/15 2026 03/08/15 0455  BP: 117/70 115/70 112/76 134/90  Pulse: 102 104 91 91  Temp: 98.2 F (36.8 C) 98.6 F (37 C) 98.5 F (36.9 C) 97.8 F (36.6 C)  TempSrc: Oral Oral Oral Oral  Resp: '18 18 18 18  ' Height:      Weight:      SpO2: 100% 98% 100% 99%    Intake/Output Summary (Last 24 hours) at 03/08/15  1015 Last data filed at 03/08/15 0350  Gross per 24 hour  Intake 2316.66 ml  Output   1450 ml  Net 866.66 ml   Filed Weights   02/27/15 2031  Weight: 89.812 kg (198 lb)    Exam:  General: Alert, awake, oriented x3, in no acute distress.  HEENT: No bruits, no goiter.  Heart: Regular rate and rhythm. Lungs: Good air movement, clear Abdomen: Soft, nontender, nondistended, positive bowel sounds.  Neuro: Grossly intact, nonfocal.   Data Reviewed: Basic Metabolic Panel:  Recent Labs Lab 03/01/15 1400  03/04/15 0410 03/05/15 0541 03/05/15 1435 03/06/15 0515 03/07/15 0500 03/08/15 0500  NA 136  < > 134* 130* 131* 132* 136  135 139  K 4.3  < > 5.1 5.9* 5.5* 5.2* 5.0  5.0 4.1  CL 99*  < > 101 101 101 105 109  109 112*  CO2 30  < > '28 24 22 23 ' 20*  19* 22  GLUCOSE 104*  < > 120* 228* 295* 306* 310*  309* 127*  BUN 16  < > 12 19 21* 23* 26*  26* 27*  CREATININE 0.94  < > 0.94 0.98 0.95 1.25* 1.19  1.16 1.01  CALCIUM 11.4*  < > 13.9* 11.4* 10.7* 9.9 9.5  9.4 9.0  MG 2.1  --   --   --   --   --  2.0  --   PHOS 2.4*  --  3.1 1.3*  --  2.0* 1.9* 3.0  < > = values in this interval not displayed. Liver Function Tests:  Recent Labs Lab 03/01/15 1400 03/02/15 0500 03/04/15 0410 03/05/15 0541 03/06/15 0515 03/07/15 0500  03/08/15 0500  AST 33 32  --   --   --   --   --   ALT 42 39  --   --   --   --   --   ALKPHOS 76 75  --   --   --   --   --   BILITOT 1.5* 1.2  --   --   --   --   --   PROT 6.9 6.8  --   --   --   --   --   ALBUMIN 3.7  3.7 3.7 3.8 4.0 4.1 3.4* 3.5    Recent Labs Lab 03/01/15 1400  LIPASE 21*   No results for input(s): AMMONIA in the last 168 hours. CBC:  Recent Labs Lab 03/02/15 0500 03/04/15 0400 03/05/15 0541 03/06/15 0515 03/07/15 0500 03/08/15 0500  WBC 4.6 5.4 5.7 9.6 7.9 6.4  NEUTROABS 3.5  --   --   --   --   --   HGB 10.4* 10.9* 9.8* 9.3* 8.0* 9.3*  HCT 30.4* 32.9* 30.3* 27.2* 24.2* 26.9*  MCV 94.1 92.4 92.1 92.8 92.4  91.5  PLT 64* 114* 102* 80* 56* 39*   Cardiac Enzymes: No results for input(s): CKTOTAL, CKMB, CKMBINDEX, TROPONINI in the last 168 hours. BNP (last 3 results) No results for input(s): BNP in the last 8760 hours.  ProBNP (last 3 results) No results for input(s): PROBNP in the last 8760 hours.  CBG:  Recent Labs Lab 03/07/15 1640 03/07/15 2140 03/08/15 0155 03/08/15 0721 03/08/15 0738  GLUCAP 167* 140* 168* 63* 77    Recent Results (from the past 240 hour(s))  Culture, blood (routine x 2) Call MD if unable to obtain prior to antibiotics being given     Status: None   Collection Time: 02/28/15  1:20 AM  Result Value Ref Range Status   Specimen Description BLOOD LEFT HAND  Final   Special Requests BOTTLES DRAWN AEROBIC ONLY 5CC  Final   Culture   Final    NO GROWTH 5 DAYS Performed at [facility]    Report Status 03/05/2015 FINAL  Final  Culture, blood (routine x 2) Call MD if unable to obtain prior to antibiotics being given     Status: None   Collection Time: 02/28/15  1:22 AM  Result Value Ref Range Status   Specimen Description BLOOD LEFT ANTECUBITAL  Final   Special Requests BOTTLES DRAWN AEROBIC ONLY 5CC  Final   Culture   Final    NO GROWTH 5 DAYS Performed at [facility]    Report Status 03/05/2015 FINAL  Final  Culture, sputum-assessment     Status: None   Collection Time: 03/01/15  2:10 PM  Result Value Ref Range Status   Specimen Description SPUTUM  Final   Special Requests NONE  Final   Sputum evaluation   Final    MICROSCOPIC FINDINGS SUGGEST THAT THIS SPECIMEN IS NOT REPRESENTATIVE OF LOWER RESPIRATORY SECRETIONS. PLEASE RECOLLECT. NOTIFIED JOHNSON,A AT 1093 ON [id number] BY HOOKER,B    Report Status 03/01/2015 FINAL  Final     Studies: No results found.  Scheduled Meds: . antiseptic oral rinse  15 mL Mouth Rinse 6 X Daily  . CISplatin with cyclophosphamide and etoposide chemo infusion   Intravenous Once  .  dextromethorphan-guaiFENesin  1 tablet Oral BID  . DOXOrubicin (ADRIAMYCIN) CHEMO for Inpatient continous infusion  10 mg/m2 (Treatment Plan Actual) Intravenous Once  . famciclovir  500  mg Oral Daily  . fentaNYL  75 mcg Transdermal Q72H  . gabapentin  600 mg Oral TID  . insulin aspart  0-15 Units Subcutaneous TID WC  . insulin aspart  0-5 Units Subcutaneous QHS  . insulin aspart  6 Units Subcutaneous TID WC  . insulin glargine  15 Units Subcutaneous BID  . montelukast  10 mg Oral QHS  . pantoprazole  40 mg Oral BID AC  . saccharomyces boulardii  250 mg Oral BID  . sodium bicarbonate/sodium chloride   Mouth Rinse QID   Continuous Infusions: . sodium chloride 10 mL/hr at 03/04/15 1625    Time Spent: 15 min   Charlynne Cousins  [facility] Pager [phone]. If 7PM-7AM, please contact night-coverage at [email or web address], password [email or web address] 03/08/2015, 10:15 AM  LOS: 9 days

## 2015-03-09 ENCOUNTER — Other Ambulatory Visit: Payer: Self-pay | Admitting: Hematology & Oncology

## 2015-03-09 DIAGNOSIS — C9000 Multiple myeloma not having achieved remission: Secondary | ICD-10-CM

## 2015-03-09 DIAGNOSIS — N189 Chronic kidney disease, unspecified: Secondary | ICD-10-CM

## 2015-03-09 DIAGNOSIS — D696 Thrombocytopenia, unspecified: Secondary | ICD-10-CM | POA: Insufficient documentation

## 2015-03-09 LAB — CBC
HCT: 27.1 % — ABNORMAL LOW (ref 39.0–52.0)
HEMOGLOBIN: 9.4 g/dL — AB (ref 13.0–17.0)
MCH: 31.4 pg (ref 26.0–34.0)
MCHC: 34.7 g/dL (ref 30.0–36.0)
MCV: 90.6 fL (ref 78.0–100.0)
Platelets: 28 10*3/uL — CL (ref 150–400)
RBC: 2.99 MIL/uL — ABNORMAL LOW (ref 4.22–5.81)
RDW: 15 % (ref 11.5–15.5)
WBC: 3 10*3/uL — AB (ref 4.0–10.5)

## 2015-03-09 LAB — GLUCOSE, CAPILLARY
GLUCOSE-CAPILLARY: 132 mg/dL — AB (ref 65–99)
GLUCOSE-CAPILLARY: 149 mg/dL — AB (ref 65–99)
Glucose-Capillary: 233 mg/dL — ABNORMAL HIGH (ref 65–99)

## 2015-03-09 LAB — RENAL FUNCTION PANEL
ALBUMIN: 3.2 g/dL — AB (ref 3.5–5.0)
Anion gap: 5 (ref 5–15)
BUN: 26 mg/dL — AB (ref 6–20)
CALCIUM: 8.6 mg/dL — AB (ref 8.9–10.3)
CO2: 25 mmol/L (ref 22–32)
Chloride: 108 mmol/L (ref 101–111)
Creatinine, Ser: 0.94 mg/dL (ref 0.61–1.24)
GFR calc Af Amer: >60 mL/min (ref >60)
GFR calc non Af Amer: >60 mL/min (ref >60)
GLUCOSE: 120 mg/dL — AB (ref 65–99)
Phosphorus: 3.3 mg/dL (ref 2.5–4.6)
Potassium: 3.5 mmol/L (ref 3.5–5.1)
Sodium: 138 mmol/L (ref 135–145)

## 2015-03-09 MED ORDER — HEPARIN SOD (PORK) LOCK FLUSH 100 UNIT/ML IV SOLN
500.0000 [IU] | INTRAVENOUS | Status: AC | PRN
  Administered 2015-03-09: 500 [IU]

## 2015-03-09 MED ORDER — INSULIN DETEMIR 100 UNIT/ML FLEXPEN: 15.0000 [IU] | PEN_INJECTOR | Freq: Two times a day (BID) | SUBCUTANEOUS | Status: DC

## 2015-03-09 MED ORDER — PALONOSETRON HCL INJECTION 0.25 MG/5ML
0.2500 mg | Freq: Once | INTRAVENOUS | Status: AC
Start: 2015-03-09 — End: 2015-03-09
  Administered 2015-03-09: 0.25 mg via INTRAVENOUS
  Filled 2015-03-09: qty 5

## 2015-03-09 MED ORDER — FLUCONAZOLE 100 MG PO TABS: 100.0000 mg | ORAL_TABLET | Freq: Every day | ORAL | Status: DC

## 2015-03-09 MED ORDER — SODIUM CHLORIDE 0.9 % IV SOLN: INTRAVENOUS | Status: DC

## 2015-03-09 MED ORDER — FLUCONAZOLE 100 MG PO TABS
100.0000 mg | ORAL_TABLET | Freq: Every day | ORAL | Status: DC
  Administered 2015-03-09: 100 mg via ORAL
  Filled 2015-03-09 (×2): qty 1

## 2015-03-09 MED ORDER — LEVOFLOXACIN 500 MG PO TABS: 500.0000 mg | ORAL_TABLET | Freq: Every day | ORAL | Status: DC

## 2015-03-09 MED ORDER — SODIUM CHLORIDE 0.9 % IV SOLN: Freq: Once | INTRAVENOUS | Status: DC

## 2015-03-09 MED ORDER — SODIUM CHLORIDE 0.9 % IV SOLN
150.0000 mg | Freq: Once | INTRAVENOUS | Status: AC
  Administered 2015-03-09: 150 mg via INTRAVENOUS
  Filled 2015-03-09: qty 5

## 2015-03-09 MED ORDER — LEVOFLOXACIN 500 MG PO TABS
500.0000 mg | ORAL_TABLET | Freq: Every day | ORAL | Status: DC
Start: 2015-03-09 — End: 2015-03-09
  Administered 2015-03-09: 500 mg via ORAL
  Filled 2015-03-09 (×2): qty 1

## 2015-03-09 MED ORDER — SACCHAROMYCES BOULARDII 250 MG PO CAPS: 250.0000 mg | ORAL_CAPSULE | Freq: Two times a day (BID) | ORAL | Status: DC

## 2015-03-09 MED ORDER — INSULIN PEN NEEDLE 31G X 6 MM MISC: 1.0000 | Freq: Two times a day (BID) | Status: DC

## 2015-03-09 NOTE — Progress Notes (Signed)
Mr. Leoni complete his chemotherapy. He has a Lebeau nausea. I think he Piney some IV fluids and Emend/Aloxi.  His platelet count is 28,000. I would go ahead and transfuse him.  I do think that he can go home today. He wants to go home today.  I'll have started him on Levaquin and Diflucan.  There is no hemoptysis. His pain is under good control. He's had no rashes. He's had no mouth sores.  [name],  temperature 98.4. Pulse 88. Blood pressure 120/84. Head and neck exam shows no mucositis. There is no adenopathy in the neck. There is no scleral icterus. Lungs are clear. Cardiac exam regular rate and rhythm. Abdomen soft. Extremities shows no clubbing, cyanosis or edema.  With his labs, his potassium is 3.5. Calcium is 8.6.  Again, I will go ahead and give him platelets today. I know that is place or not all that low but yet I still feel that he would benefit from them with this past hemoptysis.  I will give him some Aloxi and Emend for long-term emetic control.  I will plan to get him back to the office tomorrow so that we can give him Neulasta.  I appreciate all the great care that he is received on [address]. You all have done a fantastic job.  Pete E.  1 Thessalonians 5:16-18

## 2015-03-09 NOTE — Progress Notes (Signed)
RN spoke to pt regarding orders for insulin after discharge, patient states spoke to diabetes coordinator and he is not diabetic and only take insulin while in the hospital due to steroids during chemo regimen.  States will not be using insulin at home and will follow up with oncologists should any issues arise.  Does not feel he needs any educations regarding insulin administration and use since he will not be using insulin.

## 2015-03-09 NOTE — Progress Notes (Addendum)
Note patient has discharge instructions to take Levemir 15 units bid at home at time of discharge.  Spoke with patient about this new medication for home.  Patient told me he is not diabetic and will not be taking Decadron at home anymore.  Patient told me he will be hospitalized every 3 weeks for chemotherapy and will only be getting Decadron during his hospitalizations.  Patient stated he does not want to take insulin at home b/c he is not a diabetic.  Per nursing, patient has refused Lantus and Novolog at times.  Refused Lantus yesterday PM (08/07) and this AM (08/08).  Patient also refusing Novolog SSI and Novolog Meal Coverage at times.  Refused Novolog SSI and Meal Coverage yesterday (08/08) at 8am and 12PM.  Called Dr. Aileen Fass to discuss.  Notified Dr. Aileen Fass that the patient stated he will not be taking Decadron at home.  Dr. Aileen Fass stated he plans to leave the Levemir insulin Rx in the patient's chart and asked me to document that patient does not want to take insulin at home.  Notified patient and patient's RN (Dianna) of my conversation with Dr. Aileen Fass.  Educated patient about this signs and symptoms of elevated blood glucose levels (tired, thirsty, excessive urination).  Encouraged patient to call Dr. Marin Olp if he has those symptoms at home and that he may need to seek medical care asap if he has those symptoms.  Patient appreciative of the information.    Will follow. Wyn Quaker RN, MSN, CDE Diabetes Coordinator Inpatient Glycemic Control Team Team Pager: [phone] (8a-5p)

## 2015-03-09 NOTE — Progress Notes (Signed)
L PICC D/C per MD order/47cm intact/vaseline pressure dsg applied/no bleeding.

## 2015-03-09 NOTE — Care Management Note (Signed)
Case Management Note  Patient Details  Name: Mark [name] MRN: [id number] Date of Birth: May 29, 1971  Subjective/Objective:                    Action/Plan:d/c home no needs or orders.   Expected Discharge Date:   (unknown)               Expected Discharge Plan:  Home/Self Care  In-House Referral:     Discharge planning Services  CM Consult  Post Acute Care Choice:    Choice offered to:     DME Arranged:    DME Agency:     HH Arranged:    [place] Agency:     Status of Service:  Completed, signed off  Medicare Important Message Given:    Date Medicare IM Given:    Medicare IM give by:    Date Additional Medicare IM Given:    Additional Medicare Important Message give by:     If discussed at [place] of Stay Meetings, dates discussed:    Additional Comments:  Dessa Phi, RN 03/09/2015, 10:26 AM

## 2015-03-09 NOTE — Discharge Summary (Signed)
Physician Discharge Summary  Mark [name] WCB:[id number] DOB: Dec 08, 1970 DOA: 02/27/2015  PCP: No PCP Per Patient  Admit date: 02/27/2015 Discharge date: 03/09/2015  Time spent: 35 minutes  Recommendations for Outpatient Follow-up:  1. Follow-up with oncology as an outpatient. 2. Continue Levaquin for 3 more days. 3. He was started on Lantus 15 units   Discharge Diagnoses:  Principal Problem:   Hemoptysis Active Problems:   Hypercalcemia   Lytic lesion of bone on x-ray   Multiple myeloma   Acute on chronic kidney failure   Immunocompromised   HCAP (healthcare-associated pneumonia)   GERD (gastroesophageal reflux disease)   H. pylori infection   Dyspnea   Hyperkalemia   AKI (acute kidney injury)   Discharge Condition: stable  Diet recommendation: regular  Filed Weights   02/27/15 2031  Weight: 89.812 kg (198 lb)    History of present illness:  44 year old male with past medical history of multiple myeloma, patient reports having mild cough shortness of breath and hemoptysis. CT scan of the chest was negative for PE but showed pneumonia, he was treated with levofloxacin with his last dose on 02/22/2015 he said he relates he feels better but continued to have cough with yellow sputum and a small episode of hemoptysis he decided to come to the hospital.  Hospital Course:  Health care associated pneumonia with hemoptysis: His hemoptysis resolved he was treated empirically with IV antibiotic which was D escalated to Levaquin which she will continue as an outpatient for 4 additional days.  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  Patient is on Daratumumab with Velcade and Decadron. - Continue acyclovir prophylaxis. - Started emergently on VD-PACE r on 8/3. - Last day of chemotherapy on 8.7.2016.  Thrombocytopenia: This happened after he had his chemotherapy, he will get 1 unit of platelets and follow-up with Dr.  Quita Skye as an outpatient on 03/10/2015 check on his platelets count.  Acute on chronic renal failure stage III resolved with IV hydration this is probably prerenal in etiology.  Hyperkalemia: Resolved with Kayexalate.  Normocytic anemia due to chemotherapy: His hemoglobin has remained stable around 9.3. Oncology will check a CBC on 03/10/2015.  Hyperglycemia: In the setting of Decadron. He will be sent home on levemir 15 units daily.  Consultants:  Pulmonology  ID  Dr. Marin Olp  Procedures:  Ct chest  Discharge Exam: Filed Vitals:   03/09/15 0501  BP: 120/84  Pulse: 88  Temp: 98.4 F (36.9 C)  Resp: 16    General: A&O x3 Cardiovascular: RRR Respiratory: good air movement CTA  B/L  Discharge Instructions   Discharge Instructions    Diet - low sodium heart healthy    Complete by:  As directed      Diet - low sodium heart healthy    Complete by:  As directed      Increase activity slowly    Complete by:  As directed      Increase activity slowly    Complete by:  As directed      PHYSICIAN COMMUNICATION ORDER    Complete by:  As directed   A baseline Audiogram is recommended prior to initiation of cisplatin chemotherapy.          Current Discharge Medication List    START taking these medications   Details  fluconazole (DIFLUCAN) 100 MG tablet Take 1 tablet (100 mg total) by mouth daily. Qty: 3 tablet, Refills: 0   Associated Diagnoses:  Multiple myeloma    Insulin Detemir (LEVEMIR FLEXPEN) 100 UNIT/ML Pen Inject 15 Units into the skin 2 (two) times daily. Qty: 2 pen, Refills: 2    Insulin Pen Needle 31G X 6 MM MISC 1 Device by Does not apply route 2 (two) times daily. Qty: 60 each, Refills: 3    levofloxacin (LEVAQUIN) 500 MG tablet Take 1 tablet (500 mg total) by mouth daily. Qty: 4 tablet, Refills: 0   Associated Diagnoses: Multiple myeloma    saccharomyces boulardii (FLORASTOR) 250 MG capsule Take 1 capsule (250 mg total) by mouth 2 (two) times  daily. Qty: 15 capsule, Refills: 0      CONTINUE these medications which have NOT CHANGED   Details  acetaminophen (TYLENOL) 500 MG tablet Take 1,000 mg by mouth every 6 (six) hours as needed for moderate pain or headache (headache).     bisacodyl (DULCOLAX) 5 MG EC tablet Take 5 mg by mouth daily as needed for moderate constipation (constipation).     famciclovir (FAMVIR) 500 MG tablet Take 1 tablet (500 mg total) by mouth daily. Qty: 30 tablet, Refills: 12   Associated Diagnoses: Right lower lobe pneumonia; Multiple myeloma    fentaNYL (DURAGESIC - DOSED MCG/HR) 75 MCG/HR Place 1 patch (75 mcg total) onto the skin every 3 (three) days. Qty: 10 patch, Refills: 0   Associated Diagnoses: Multiple myeloma; Hip pain, acute, right    gabapentin (NEURONTIN) 300 MG capsule Take 2 capsules (600 mg total) by mouth 3 (three) times daily. Qty: 180 capsule, Refills: 3   Associated Diagnoses: Multiple myeloma    HYDROmorphone (DILAUDID) 4 MG tablet Take 1-2, IF NEEDED, for pain every 4 hrs for pain Qty: 120 tablet, Refills: 0   Associated Diagnoses: Myeloma; Multiple myeloma    LORazepam (ATIVAN) 0.5 MG tablet Take 1 tablet (0.5 mg total) by mouth every 6 (six) hours as needed (Nausea or vomiting). Qty: 30 tablet, Refills: 0   Associated Diagnoses: Myeloma    metoCLOPramide (REGLAN) 10 MG tablet Take 1 tablet (10 mg total) by mouth every 6 (six) hours as needed for nausea or vomiting. Qty: 20 tablet, Refills: 0    montelukast (SINGULAIR) 10 MG tablet Take 1 tablet (10 mg total) by mouth at bedtime. Qty: 30 tablet, Refills: 0   Associated Diagnoses: Right lower lobe pneumonia; Multiple myeloma    ondansetron (ZOFRAN ODT) 8 MG disintegrating tablet Take 1 tablet (8 mg total) by mouth every 8 (eight) hours as needed for nausea or vomiting. Qty: 30 tablet, Refills: 2   Associated Diagnoses: Multiple myeloma; Nausea and vomiting, vomiting of unspecified type; Insomnia due to medical condition     pantoprazole (PROTONIX) 40 MG tablet Take 1 tablet (40 mg total) by mouth daily. Qty: 30 tablet, Refills: 5   Associated Diagnoses: Multiple myeloma; Nausea and vomiting, vomiting of unspecified type; Insomnia due to medical condition    prochlorperazine (COMPAZINE) 10 MG tablet Take 1 tablet (10 mg total) by mouth every 6 (six) hours as needed (Nausea or vomiting). Qty: 30 tablet, Refills: 1   Associated Diagnoses: Myeloma    temazepam (RESTORIL) 22.5 MG capsule Take 1 capsule (22.5 mg total) by mouth at bedtime as needed for sleep. Qty: 30 capsule, Refills: 0   Associated Diagnoses: Multiple myeloma; Nausea and vomiting, vomiting of unspecified type; Insomnia due to medical condition    dexamethasone (DECADRON) 4 MG tablet Take 5 pills once a week with food for 3 weeks in a row then off 1 week. Qty:  120 tablet, Refills: 2   Associated Diagnoses: Right lower lobe pneumonia; Multiple myeloma    Ipratropium-Albuterol (COMBIVENT) 20-100 MCG/ACT AERS respimat Inhale 1 puff into the lungs every 6 (six) hours. Qty: 1 Inhaler, Refills: 0   Associated Diagnoses: Right lower lobe pneumonia; Multiple myeloma    lidocaine-prilocaine (EMLA) cream Apply to affected area once Qty: 30 g, Refills: 3   Associated Diagnoses: Myeloma    ondansetron (ZOFRAN) 8 MG tablet Take 1 tablet (8 mg total) by mouth 2 (two) times daily. Start the day after chemo for 2 days. Then take as needed for nausea or vomiting. Qty: 30 tablet, Refills: 1   Associated Diagnoses: Myeloma      STOP taking these medications     ibuprofen (ADVIL,MOTRIN) 200 MG tablet        Allergies  Allergen Reactions  . Gadolinium Derivatives Nausea And Vomiting    Pt became very nauseous and got sick.       The results of significant diagnostics from this hospitalization (including imaging, microbiology, ancillary and laboratory) are listed below for reference.    Significant Diagnostic Studies: Dg Chest 2 View  03/06/2015    CLINICAL DATA:  Pneumonia.  EXAM: CHEST  2 VIEW  COMPARISON:  03/03/2015, 03/02/2015.  CT 02/27/2015.  FINDINGS: Power port catheter noted in good anatomic position. Left PICC line noted with tip projected over cavoatrial junction. Mediastinum hilar structures are normal. Heart size normal. Interim near complete clearing of right upper lobe infiltrate. Left pleural base mass again noted.  IMPRESSION: 1. Power port catheter and PICC line in good anatomic position . 2. Interim near complete clearing of right upper lobe infiltrate. Persistent left lung pleural based mass again noted.   Electronically Signed   By: Marcello Moores  Register   On: 03/06/2015 09:45   Dg Chest 2 View  03/02/2015   CLINICAL DATA:  Pneumonia.  EXAM: CHEST  2 VIEW  COMPARISON:  02/27/2015.  CT 02/27/2015 .  PET-CT 02/26/2015 .  FINDINGS: PowerPort catheter in stable position. Mediastinum hilar structures stable. Heart size normal. Persistent right upper lobe infiltrate . Persistent left anterior fourth rib soft tissue lesion. No pleural effusion or pneumothorax. Bony lytic lesions consistent with myeloma best demonstrated by prior CT.  IMPRESSION: 1. Power port catheter in stable position. 2. Persistent right upper lobe infiltrate. 3. Persistent left anterior fourth rib soft tissue mass. 4. Bony lytic lesions consistent with myeloma best demonstrated by prior CT .   Electronically Signed   By: Marcello Moores  Register   On: 03/02/2015 08:18   Dg Chest 2 View  02/27/2015   CLINICAL DATA:  Multiple myeloma. On chemotherapy. Cough and hemoptysis.  EXAM: CHEST  2 VIEW  COMPARISON:  02/09/2015 chest radiograph.  02/26/2015 PET-CT.  FINDINGS: Right internal jugular MediPort terminates at the cavoatrial junction. Stable cardiomediastinal silhouette with normal heart size. No pneumothorax. No pleural effusion. New patchy focal consolidation in the mid to upper right lung, likely in the superior segment of the right lower lobe. Low lung volumes with mild  bibasilar atelectasis. Stable mild compression deformities of two lower thoracic vertebral bodies.  IMPRESSION: New patchy focal consolidation in the mid to upper right lung, likely in the superior segment of the right lower lobe. Differential includes pneumonia or alveolar hemorrhage.   Electronically Signed   By: Ilona Sorrel M.D.   On: 02/27/2015 17:17   Dg Chest 2 View  02/09/2015   CLINICAL DATA:  Chest pain, shortness of breath, headache and right  flank pain. History of multiple myeloma. Initial encounter.  EXAM: CHEST  2 VIEW  COMPARISON:  Radiographs 11/02/2014 and 02/04/2015.  FINDINGS: Right IJ Port-A-Cath tip unchanged in the lower SVC. The heart size and mediastinal contours are stable. The lungs are clear. There is no pleural effusion or pneumothorax. Old rib and lower thoracic fractures are noted. No acute osseous findings evident.  IMPRESSION: Stable chest.  No acute cardiopulmonary process.   Electronically Signed   By: Richardean Sale M.D.   On: 02/09/2015 16:10   Ct Chest Wo Contrast  02/27/2015   CLINICAL DATA:  Multiple episodes hemoptysis today.  EXAM: CT CHEST WITHOUT CONTRAST  TECHNIQUE: Multidetector CT imaging of the chest was performed following the standard protocol without IV contrast.  COMPARISON:  Radiographs 02/27/2015, CT 02/09/2015, PET 02/26/2015  FINDINGS: There is alveolar opacity in the posterior segment of the right upper lobe with air bronchograms. This could represent infectious infiltrate or pulmonary hemorrhage. This corresponds to the radiographic abnormality observed earlier. The area involved measures approximately 5 cm in diameter. The airways serving this lung are widely patent. Minimal patchy alveolar opacity is present in the left upper lobe laterally, potentially another area of pulmonary hemorrhage or infectious infiltrate. This is a much smaller focus of abnormality. Both of these abnormalities are new from the PET/CT scan obtained on 02/26/2015. There is  unchanged pleural parenchymal linear densities which likely represent scarring, lower lobe predominant. No significant adenopathy is evident in the mediastinum or hila. There is nonspecific mildly prominent soft tissue in the upper mediastinum between the origins of the left subclavian and left common carotid arteries, unchanged.  There are no effusions.  There are innumerable skeletal lesions consistent with myeloma. There is a soft tissue mass surrounding the anterior portion of the left fourth rib and this is slightly larger than on 02/09/2015, measuring 2.4 x 4.9 cm and previously measuring 2.3 x 4.2 cm.  IMPRESSION: 1. The radiographic abnormality corresponds to a 5 cm area of alveolar opacity with air bronchograms in the posterior segment of the right upper lobe. This could represent infectious infiltrate or pulmonary hemorrhage. It is new from yesterday's PET-CT scan. There is a smaller area of patchy alveolar opacity in the left upper lobe laterally, also new, potentially representing another focus of the same acute process. 2. Soft tissue mass surrounding the anterior left fourth rib has slightly enlarged from 02/09/2015. 3. Innumerable lytic lesions throughout the skeleton consistent with myeloma.   Electronically Signed   By: Andreas Newport M.D.   On: 02/27/2015 22:58   Ct Angio Chest W/cm &/or Wo Cm  02/09/2015   CLINICAL DATA:  44 year old male with chest pain and dyspnea  EXAM: CT ANGIOGRAPHY CHEST WITH CONTRAST  TECHNIQUE: Multidetector CT imaging of the chest was performed using the standard protocol during bolus administration of intravenous contrast. Multiplanar CT image reconstructions and MIPs were obtained to evaluate the vascular anatomy.  CONTRAST:  138m OMNIPAQUE IOHEXOL 350 MG/ML SOLN  COMPARISON:  Chest radiograph dated 02/09/2015  FINDINGS: There are areas of ground-glass opacity and nodularity involving the left lower lobe as well as right upper lobe most compatible with pneumonia.  Clinical correlation and follow-up to resolution is recommended. No pleural effusion or pneumothorax. The central airways are patent.  The visualized thoracic aorta is unremarkable. No CT evidence of pulmonary embolism. No hilar or mediastinal adenopathy. No cardiomegaly. No pericardial effusion. The visualized thyroid appears unremarkable.  Right pectoral Port-A-Cath with tip in the right atrium. Multiple  lytic lesions noted throughout the thoracic vertebra compatible with known multiple myeloma. Old healed left second rib fracture as well as old healed fracture of the lateral left clavicle. No acute fracture.  Review of the MIP images confirms the above findings.  IMPRESSION: No CT evidence of pulmonary embolism.  Left lower lobe and right upper lobe nodularity most compatible with pneumonia. Clinical correlation and follow-up recommended.  Innumerable osseous lytic lesions compatible with known multiple myeloma.   Electronically Signed   By: Anner Crete M.D.   On: 02/09/2015 20:26   Nm Pet Image Restage (ps) Whole Body  02/26/2015   CLINICAL DATA:  Subsequent treatment strategy for multiple myeloma.  EXAM: NUCLEAR MEDICINE PET WHOLE BODY  TECHNIQUE: 9.8 mCi F-18 FDG was injected intravenously. Full-ring PET imaging was performed from the vertex to the feet after the radiotracer. CT data was obtained and used for attenuation correction and anatomic localization.  FASTING BLOOD GLUCOSE:  Value:  186 mg/dl  COMPARISON:  Chest CT 02/09/2015, head CT 02/04/2015, MR pelvis 12/10/2014  FINDINGS: Head/Neck: No hypermetabolic lymph nodes in the neck. There is a focus of asymmetric metabolic activity in the central LEFT temporal cortex (image 16). There is no corresponding lesion on the contrast CT of 02/04/2015 therefore is likely a benign cortical asymmetric activity.  Chest: No hypermetabolic mediastinal or hilar nodes. No suspicious pulmonary nodules on the CT scan.  Abdomen/Pelvis: No abnormal hypermetabolic  activity within the liver, pancreas, adrenal glands, or spleen. No hypermetabolic lymph nodes in the abdomen or pelvis.  Skeleton: There is a hypermetabolic soft tissue lesions associated with the LEFT anterior fourth rib. The soft tissue component measures 2.9 by 4.5 cm with SUV max 7.6.  There is several hypermetabolic foci associated with the LEFT iliac bone. There is a lytic lesion with cortical destruction noted in the iliac crest posteriorly on image 190, series 4 with SUV max equal 8.3.  There is focus of hypermetabolic activity within the marrow space of the RIGHT femoral diaphysis with a subtle soft tissue lesion seen within the medullary space on image 272, series 4. The activity is intense at this site with SUV max equal 8.7.  There are multiple additional lytic lesions throughout the spine without associated metabolic activity.  Extremities: No hypermetabolic activity to suggest metastasis.  IMPRESSION: 1. Several foci hypermetabolic activity within the skeleton consistent with active myeloma lesions. Hypermetabolic lesions include; soft tissue lesion within the LEFT anterior fifth rib, lytic lesion within the LEFT iliac crest, and intra medullary lesion in the mid RIGHT femur. 2. Multiple additional lytic lesions within the spine and pelvis do not have associated metabolic activity. 3. Asymmetric metabolic activity within the LEFT temporal region without corresponding lesion on comparison contrast head CT of 02/04/2015 is likely benign.   Electronically Signed   By: Suzy Bouchard M.D.   On: 02/26/2015 11:57   Dg Chest Port 1 View  03/03/2015   CLINICAL DATA:  Hypoxia, dyspnea  EXAM: PORTABLE CHEST - 1 VIEW  COMPARISON:  03/02/2015  FINDINGS: Right upper lobe infiltrate appears slightly improved.  No effusion  Mild bibasilar atelectasis related to hypoventilation and decreased lung volume. No effusion. Port-A-Cath tip in the upper right atrium unchanged. Vascularity normal.  IMPRESSION: Mild  improvement right upper lobe infiltrate consistent with pneumonia  Hypoventilation with increase in bibasilar atelectasis.   Electronically Signed   By: Franchot Gallo M.D.   On: 03/03/2015 11:23    Microbiology: Recent Results (from the past 240 hour(s))  Culture, blood (routine x 2) Call MD if unable to obtain prior to antibiotics being given     Status: None   Collection Time: 02/28/15  1:20 AM  Result Value Ref Range Status   Specimen Description BLOOD LEFT HAND  Final   Special Requests BOTTLES DRAWN AEROBIC ONLY 5CC  Final   Culture   Final    NO GROWTH 5 DAYS Performed at [facility]    Report Status 03/05/2015 FINAL  Final  Culture, blood (routine x 2) Call MD if unable to obtain prior to antibiotics being given     Status: None   Collection Time: 02/28/15  1:22 AM  Result Value Ref Range Status   Specimen Description BLOOD LEFT ANTECUBITAL  Final   Special Requests BOTTLES DRAWN AEROBIC ONLY 5CC  Final   Culture   Final    NO GROWTH 5 DAYS Performed at [facility]    Report Status 03/05/2015 FINAL  Final  Culture, sputum-assessment     Status: None   Collection Time: 03/01/15  2:10 PM  Result Value Ref Range Status   Specimen Description SPUTUM  Final   Special Requests NONE  Final   Sputum evaluation   Final    MICROSCOPIC FINDINGS SUGGEST THAT THIS SPECIMEN IS NOT REPRESENTATIVE OF LOWER RESPIRATORY SECRETIONS. PLEASE RECOLLECT. NOTIFIED JOHNSON,A AT [id number] ON [id number] BY HOOKER,B    Report Status 03/01/2015 FINAL  Final     Labs: Basic Metabolic Panel:  Recent Labs Lab 03/05/15 0541 03/05/15 1435 03/06/15 0515 03/07/15 0500 03/08/15 0500 03/09/15 0550  NA 130* 131* 132* 136  135 139 138  K 5.9* 5.5* 5.2* 5.0  5.0 4.1 3.5  CL 101 101 105 109  109 112* 108  CO2 _0 20*  19* 22 25  GLUCOSE 228* 295* 306* 310*  309* 127* 120*  BUN 19 21* 23* 26*  26* 27* 26*  CREATININE 0.98 0.95 1.25* 1.19  1.16 1.01 0.94  CALCIUM 11.4* 10.7* 9.9  9.5  9.4 9.0 8.6*  MG  --   --   --  2.0  --   --   PHOS 1.3*  --  2.0* 1.9* 3.0 3.3   Liver Function Tests:  Recent Labs Lab 03/05/15 0541 03/06/15 0515 03/07/15 0500 03/08/15 0500 03/09/15 0550  ALBUMIN 4.0 4.1 3.4* 3.5 3.2*   No results for input(s): LIPASE, AMYLASE in the last 168 hours. No results for input(s): AMMONIA in the last 168 hours. CBC:  Recent Labs Lab 03/05/15 0541 03/06/15 0515 03/07/15 0500 03/08/15 0500 03/09/15 0550  WBC 5.7 9.6 7.9 6.4 3.0*  HGB 9.8* 9.3* 8.0* 9.3* 9.4*  HCT 30.3* 27.2* 24.2* 26.9* 27.1*  MCV 92.1 92.8 92.4 91.5 90.6  PLT 102* 80* 56* 39* 28*   Cardiac Enzymes: No results for input(s): CKTOTAL, CKMB, CKMBINDEX, TROPONINI in the last 168 hours. BNP: BNP (last 3 results) No results for input(s): BNP in the last 8760 hours.  ProBNP (last 3 results) No results for input(s): PROBNP in the last 8760 hours.  CBG:  Recent Labs Lab 03/08/15 1646 03/08/15 2127 03/08/15 2242 03/09/15 0110 03/09/15 0736  GLUCAP 125* 193* 185* 132* 233*       Signed:  Charlynne Cousins  [facility] 03/09/2015, 8:24 AM

## 2015-03-09 NOTE — Progress Notes (Signed)
Patient discharged home, all discharge medications and instructions reviewed and questions answered. Patient to be assisted to vehicle by wheelchair when ride arrives. 

## 2015-03-10 ENCOUNTER — Ambulatory Visit
Admission: RE | Admit: 2015-03-10 | Payer: BLUE CROSS/BLUE SHIELD | Source: Ambulatory Visit | Admitting: Radiation Oncology

## 2015-03-10 ENCOUNTER — Telehealth: Payer: Self-pay | Admitting: Hematology & Oncology

## 2015-03-10 ENCOUNTER — Ambulatory Visit (HOSPITAL_BASED_OUTPATIENT_CLINIC_OR_DEPARTMENT_OTHER): Payer: BLUE CROSS/BLUE SHIELD | Admitting: Family

## 2015-03-10 ENCOUNTER — Telehealth: Payer: Self-pay | Admitting: Nurse Practitioner

## 2015-03-10 ENCOUNTER — Ambulatory Visit (HOSPITAL_BASED_OUTPATIENT_CLINIC_OR_DEPARTMENT_OTHER): Payer: BLUE CROSS/BLUE SHIELD

## 2015-03-10 ENCOUNTER — Encounter: Payer: Self-pay | Admitting: Family

## 2015-03-10 ENCOUNTER — Other Ambulatory Visit (HOSPITAL_BASED_OUTPATIENT_CLINIC_OR_DEPARTMENT_OTHER): Payer: BLUE CROSS/BLUE SHIELD

## 2015-03-10 VITALS — BP 121/75 | HR 91 | Temp 98.3°F | Resp 16

## 2015-03-10 DIAGNOSIS — C9002 Multiple myeloma in relapse: Secondary | ICD-10-CM

## 2015-03-10 DIAGNOSIS — Z5112 Encounter for antineoplastic immunotherapy: Secondary | ICD-10-CM

## 2015-03-10 DIAGNOSIS — C9000 Multiple myeloma not having achieved remission: Secondary | ICD-10-CM

## 2015-03-10 LAB — CMP (CANCER CENTER ONLY)
ALT: 24 U/L (ref 10–47)
AST: 24 U/L (ref 11–38)
Albumin: 3 g/dL — ABNORMAL LOW (ref 3.3–5.5)
Alkaline Phosphatase: 62 U/L (ref 26–84)
BUN: 18 mg/dL (ref 7–22)
CHLORIDE: 110 meq/L — AB (ref 98–108)
CO2: 24 meq/L (ref 18–33)
CREATININE: 0.9 mg/dL (ref 0.6–1.2)
Calcium: 8.6 mg/dL (ref 8.0–10.3)
GLUCOSE: 176 mg/dL — AB (ref 73–118)
Potassium: 3.5 mEq/L (ref 3.3–4.7)
SODIUM: 143 meq/L (ref 128–145)
TOTAL PROTEIN: 5.7 g/dL — AB (ref 6.4–8.1)
Total Bilirubin: 0.8 mg/dl (ref 0.20–1.60)

## 2015-03-10 LAB — CBC WITH DIFFERENTIAL (CANCER CENTER ONLY)
BASO#: 0 10*3/uL (ref 0.0–0.2)
BASO%: 0 % (ref 0.0–2.0)
EOS%: 1.1 % (ref 0.0–7.0)
Eosinophils Absolute: 0 10*3/uL (ref 0.0–0.5)
HEMATOCRIT: 27 % — AB (ref 38.7–49.9)
HEMOGLOBIN: 9.6 g/dL — AB (ref 13.0–17.1)
LYMPH#: 0.1 10*3/uL — ABNORMAL LOW (ref 0.9–3.3)
LYMPH%: 2.6 % — ABNORMAL LOW (ref 14.0–48.0)
MCH: 32.7 pg (ref 28.0–33.4)
MCHC: 35.6 g/dL (ref 32.0–35.9)
MCV: 92 fL (ref 82–98)
MONO#: 0 10*3/uL — ABNORMAL LOW (ref 0.1–0.9)
MONO%: 0.7 % (ref 0.0–13.0)
NEUT%: 95.6 % — AB (ref 40.0–80.0)
NEUTROS ABS: 2.6 10*3/uL (ref 1.5–6.5)
Platelets: 41 10*3/uL — ABNORMAL LOW (ref 145–400)
RBC: 2.94 10*6/uL — ABNORMAL LOW (ref 4.20–5.70)
RDW: 14 % (ref 11.1–15.7)
WBC: 2.7 10*3/uL — ABNORMAL LOW (ref 4.0–10.0)

## 2015-03-10 LAB — PREPARE PLATELET PHERESIS: Unit division: 0

## 2015-03-10 MED ORDER — HEPARIN SOD (PORK) LOCK FLUSH 100 UNIT/ML IV SOLN
500.0000 [IU] | Freq: Once | INTRAVENOUS | Status: AC | PRN
Start: 2015-03-10 — End: 2015-03-10
  Administered 2015-03-10: 500 [IU]
  Filled 2015-03-10: qty 5

## 2015-03-10 MED ORDER — SODIUM CHLORIDE 0.9 % IJ SOLN
10.0000 mL | INTRAMUSCULAR | Status: DC | PRN
  Administered 2015-03-10: 10 mL
  Filled 2015-03-10: qty 10

## 2015-03-10 MED ORDER — BORTEZOMIB CHEMO SQ INJECTION 3.5 MG (2.5MG/ML)
1.3000 mg/m2 | Freq: Once | INTRAMUSCULAR | Status: AC
  Administered 2015-03-10: 2.75 mg via SUBCUTANEOUS
  Filled 2015-03-10: qty 2.75

## 2015-03-10 MED ORDER — ONDANSETRON HCL 8 MG PO TABS
ORAL_TABLET | ORAL | Status: AC
  Filled 2015-03-10: qty 1

## 2015-03-10 MED ORDER — SODIUM CHLORIDE 0.9 % IV SOLN
Freq: Once | INTRAVENOUS | Status: AC
  Administered 2015-03-10: 13:00:00 via INTRAVENOUS

## 2015-03-10 MED ORDER — ONDANSETRON HCL 8 MG PO TABS
8.0000 mg | ORAL_TABLET | Freq: Once | ORAL | Status: AC
  Administered 2015-03-10: 8 mg via ORAL

## 2015-03-10 MED ORDER — PEGFILGRASTIM 6 MG/0.6ML ~~LOC~~ PSKT
6.0000 mg | PREFILLED_SYRINGE | Freq: Once | SUBCUTANEOUS | Status: DC
  Filled 2015-03-10: qty 0.6

## 2015-03-10 NOTE — Telephone Encounter (Signed)
APPROVED 03/10/15 - 09/27/15 FOR 6CYCLES  APPROVAL ID# [id number]

## 2015-03-10 NOTE — Telephone Encounter (Signed)
I spoke w Chantelle w [employer] today and she advised [place] for Neulasta   [id number]   P: [phone]    Ref: [id number]

## 2015-03-10 NOTE — Progress Notes (Signed)
Hematology and Oncology Follow Up Visit  Bradly Sangiovanni [id number] 09-10-1970 44 y.o. 03/10/2015   Principle Diagnosis:  Relapsed kappa light chain myeloma - July 2016  - transplant at [facility] in February 2015   Current Therapy:   Salvage VD-PACE q 21 days  Zometa 4 mg IV every month    Interim History: Mr. Gaertner is here today for follow-up. He received treatment while in the hospital over there weekend. He did well with this and only had some mild nausea. He received platelets yesterday before discharge for anemia. His CBC and CMP today look better.  He is c/o fatigue, dizziness and a headache today. He is currently taking Diflucan and Levaquin.  No syncope or falls.  He deniesfever, chills, cough, blurred vision, constipation, diarrhea, rash, chest pain, palpitations, abdominal pain, problems urinating or blood in urine or stool. No SOB at this time.   No lymphadenopathy found on assessment.  No swelling in his extremities. He still has numbness and tingling in his feet and legs. No new aches or pains.  He denies feeling dehydrated/ He is able to eat and has been drinking fluids. His weight is stable.   Medications:    Medication List       This list is accurate as of: 03/10/15  3:31 PM.  Always use your most recent med list.               acetaminophen 500 MG tablet  Commonly known as:  TYLENOL  Take 1,000 mg by mouth every 6 (six) hours as needed for moderate pain or headache (headache).     bisacodyl 5 MG EC tablet  Commonly known as:  DULCOLAX  Take 5 mg by mouth daily as needed for moderate constipation (constipation).     dexamethasone 4 MG tablet  Commonly known as:  DECADRON  Take 5 pills once a week with food for 3 weeks in a row then off 1 week.     famciclovir 500 MG tablet  Commonly known as:  FAMVIR  Take 1 tablet (500 mg total) by mouth daily.     fentaNYL 75 MCG/HR  Commonly known as:  DURAGESIC - dosed mcg/hr  Place 1 patch (75 mcg total) onto the  skin every 3 (three) days.     fluconazole 100 MG tablet  Commonly known as:  DIFLUCAN  Take 1 tablet (100 mg total) by mouth daily.     gabapentin 300 MG capsule  Commonly known as:  NEURONTIN  Take 2 capsules (600 mg total) by mouth 3 (three) times daily.     HYDROmorphone 4 MG tablet  Commonly known as:  DILAUDID  Take 1-2, IF NEEDED, for pain every 4 hrs for pain     Insulin Detemir 100 UNIT/ML Pen  Commonly known as:  LEVEMIR FLEXPEN  Inject 15 Units into the skin 2 (two) times daily.     Insulin Pen Needle 31G X 6 MM Misc  1 Device by Does not apply route 2 (two) times daily.     Ipratropium-Albuterol 20-100 MCG/ACT Aers respimat  Commonly known as:  COMBIVENT  Inhale 1 puff into the lungs every 6 (six) hours.     levofloxacin 500 MG tablet  Commonly known as:  LEVAQUIN  Take 1 tablet (500 mg total) by mouth daily.     lidocaine-prilocaine cream  Commonly known as:  EMLA  Apply to affected area once     LORazepam 0.5 MG tablet  Commonly known as:  ATIVAN  Take 1 tablet (0.5 mg total) by mouth every 6 (six) hours as needed (Nausea or vomiting).     metoCLOPramide 10 MG tablet  Commonly known as:  REGLAN  Take 1 tablet (10 mg total) by mouth every 6 (six) hours as needed for nausea or vomiting.     montelukast 10 MG tablet  Commonly known as:  SINGULAIR  Take 1 tablet (10 mg total) by mouth at bedtime.     ondansetron 8 MG disintegrating tablet  Commonly known as:  ZOFRAN ODT  Take 1 tablet (8 mg total) by mouth every 8 (eight) hours as needed for nausea or vomiting.     pantoprazole 40 MG tablet  Commonly known as:  PROTONIX  Take 1 tablet (40 mg total) by mouth daily.     prochlorperazine 10 MG tablet  Commonly known as:  COMPAZINE  Take 1 tablet (10 mg total) by mouth every 6 (six) hours as needed (Nausea or vomiting).     saccharomyces boulardii 250 MG capsule  Commonly known as:  FLORASTOR  Take 1 capsule (250 mg total) by mouth 2 (two) times daily.      temazepam 22.5 MG capsule  Commonly known as:  RESTORIL  Take 1 capsule (22.5 mg total) by mouth at bedtime as needed for sleep.        Allergies:  Allergies  Allergen Reactions  . Gadolinium Derivatives Nausea And Vomiting    Pt became very nauseous and got sick.     Past Medical History, Surgical history, Social history, and Family History were reviewed and updated.  Review of Systems: All other 10 point review of systems is negative.   Physical Exam:  oral temperature is 98.3 F (36.8 C). His blood pressure is 121/75 and his pulse is 91. His respiration is 16.   Wt Readings from Last 3 Encounters:  02/27/15 198 lb (89.812 kg)  02/25/15 198 lb (89.812 kg)  02/11/15 197 lb (89.359 kg)    Ocular: Sclerae unicteric, pupils equal, round and reactive to light Ear-nose-throat: Oropharynx clear, dentition fair Lymphatic: No cervical or supraclavicular adenopathy Lungs no rales or rhonchi, good excursion bilaterally Heart regular rate and rhythm, no murmur appreciated Abd soft, nontender, positive bowel sounds MSK no focal spinal tenderness, no joint edema Neuro: non-focal, well-oriented, appropriate affect  Lab Results  Component Value Date   WBC 2.7* 03/10/2015   HGB 9.6* 03/10/2015   HCT 27.0* 03/10/2015   MCV 92 03/10/2015   PLT 41* 03/10/2015   No results found for: FERRITIN, IRON, TIBC, UIBC, IRONPCTSAT Lab Results  Component Value Date   RBC 2.94* 03/10/2015   Lab Results  Component Value Date   KPAFRELGTCHN 96.80* 02/25/2015   LAMBDASER 0.51* 02/25/2015   KAPLAMBRATIO 189.80* 02/25/2015   Lab Results  Component Value Date   IGGSERUM 962 02/25/2015   IGA 28* 02/25/2015   IGMSERUM 10* 02/25/2015   Lab Results  Component Value Date   TOTALPROTELP 7.1 02/25/2015   ALBUMINELP 4.5 02/25/2015   A1GS 0.4* 02/25/2015   A2GS 0.7 02/25/2015   BETS 0.4 02/25/2015   BETA2SER 0.4 02/25/2015   GAMS 0.8 02/25/2015   MSPIKE 0.23 09/22/2014   SPEI *  02/25/2015     Chemistry      Component Value Date/Time   NA 143 03/10/2015 1136   NA 138 03/09/2015 0550   NA 145 02/21/2013 0908   K 3.5 03/10/2015 1136   K 3.5 03/09/2015 0550   K 3.8 02/21/2013 0908  CL 110* 03/10/2015 1136   CL 108 03/09/2015 0550   CO2 24 03/10/2015 1136   CO2 25 03/09/2015 0550   CO2 32* 02/21/2013 0908   BUN 18 03/10/2015 1136   BUN 26* 03/09/2015 0550   BUN 19.5 02/21/2013 0908   CREATININE 0.9 03/10/2015 1136   CREATININE 0.94 03/09/2015 0550   CREATININE 1.5* 02/21/2013 0908      Component Value Date/Time   CALCIUM 8.6 03/10/2015 1136   CALCIUM 8.6* 03/09/2015 0550   CALCIUM 17.7 Repeated and Verified* 02/21/2013 0908   CALCIUM >15.0* 02/01/2013 1230   ALKPHOS 62 03/10/2015 1136   ALKPHOS 75 03/02/2015 0500   AST 24 03/10/2015 1136   AST 32 03/02/2015 0500   ALT 24 03/10/2015 1136   ALT 39 03/02/2015 0500   BILITOT 0.80 03/10/2015 1136   BILITOT 1.2 03/02/2015 0500     Impression and Plan: Mr. Hamrick is 44 year old African American male with relapsed kappa light chain myeloma. He did undergo stem cell transplant for his myeloma on September 11, 2013 at [facility].  He started treatment with VD-PACE over the weekend. He did well with it. He is having a headache and fatigue today.  His lab work is improved. He is getting fluids today.  We will give him Velcade today and Neulasta tomorrow per his request. He did not want the OnPro.   Our goal is to get his disease under control and then hopefully send him for an allogenic stem cell transplant.  We will continue to monitor him closely. His SPEP results are pending.  He has his current treatment and appointment schedule. His next follow-up will be on August 22nd.  He knows to call with any questions or concerns. We can always see him sooner if need be.   [name], NP 8/9/20163:31 PM

## 2015-03-10 NOTE — Telephone Encounter (Signed)
UPDATE            [id number] NEULASTA  APPROVED [phone]   03/10/2015 - 09/27/2015                 P: AIM @ [phone] F: [phone] fax clinicals  P: [phone] #3 peer to peer    [place] Member #: [id number]

## 2015-03-10 NOTE — Patient Instructions (Signed)
[facility] Discharge Instructions for Patients Receiving Chemotherapy  Today you received the following chemotherapy agents Velcade  To help prevent nausea and vomiting after your treatment, we encourage you to take your nausea medication    If you develop nausea and vomiting that is not controlled by your nausea medication, call the clinic.   BELOW ARE SYMPTOMS THAT SHOULD BE REPORTED IMMEDIATELY:  *FEVER GREATER THAN 100.5 F  *CHILLS WITH OR WITHOUT FEVER  NAUSEA AND VOMITING THAT IS NOT CONTROLLED WITH YOUR NAUSEA MEDICATION  *UNUSUAL SHORTNESS OF BREATH  *UNUSUAL BRUISING OR BLEEDING  TENDERNESS IN MOUTH AND THROAT WITH OR WITHOUT PRESENCE OF ULCERS  *URINARY PROBLEMS  *BOWEL PROBLEMS  UNUSUAL RASH Items with * indicate a potential emergency and should be followed up as soon as possible.  Feel free to call the clinic you have any questions or concerns. The clinic phone number is [phone].  Please show the CHEMO ALERT CARD at check-in to the Emergency Department and triage nurse.   

## 2015-03-11 ENCOUNTER — Ambulatory Visit: Payer: BLUE CROSS/BLUE SHIELD

## 2015-03-11 ENCOUNTER — Other Ambulatory Visit: Payer: BLUE CROSS/BLUE SHIELD

## 2015-03-11 ENCOUNTER — Ambulatory Visit (HOSPITAL_BASED_OUTPATIENT_CLINIC_OR_DEPARTMENT_OTHER): Payer: BLUE CROSS/BLUE SHIELD

## 2015-03-11 VITALS — BP 138/85 | HR 79 | Temp 98.4°F

## 2015-03-11 DIAGNOSIS — Z5189 Encounter for other specified aftercare: Secondary | ICD-10-CM | POA: Diagnosis not present

## 2015-03-11 DIAGNOSIS — C9000 Multiple myeloma not having achieved remission: Secondary | ICD-10-CM

## 2015-03-11 MED ORDER — PEGFILGRASTIM INJECTION 6 MG/0.6ML ~~LOC~~
6.0000 mg | PREFILLED_SYRINGE | Freq: Once | SUBCUTANEOUS | Status: AC
  Administered 2015-03-11: 6 mg via SUBCUTANEOUS

## 2015-03-11 MED ORDER — PEGFILGRASTIM INJECTION 6 MG/0.6ML ~~LOC~~
PREFILLED_SYRINGE | SUBCUTANEOUS | Status: AC
  Filled 2015-03-11: qty 0.6

## 2015-03-11 NOTE — Patient Instructions (Signed)
Pegfilgrastim injection What is this medicine? PEGFILGRASTIM (peg fil GRA stim) is a long-acting granulocyte colony-stimulating factor that stimulates the growth of neutrophils, a type of white blood cell important in the body's fight against infection. It is used to reduce the incidence of fever and infection in patients with certain types of cancer who are receiving chemotherapy that affects the bone marrow. This medicine may be used for other purposes; ask your health care provider or pharmacist if you have questions. COMMON BRAND NAME(S): Neulasta What should I tell my health care provider before I take this medicine? They need to know if you have any of these conditions: -latex allergy -ongoing radiation therapy -sickle cell disease -skin reactions to acrylic adhesives (On-Body Injector only) -an unusual or allergic reaction to pegfilgrastim, filgrastim, other medicines, foods, dyes, or preservatives -pregnant or trying to get pregnant -breast-feeding How should I use this medicine? This medicine is for injection under the skin. If you get this medicine at home, you will be taught how to prepare and give the pre-filled syringe or how to use the On-body Injector. Refer to the patient Instructions for Use for detailed instructions. Use exactly as directed. Take your medicine at regular intervals. Do not take your medicine more often than directed. It is important that you put your used needles and syringes in a special sharps container. Do not put them in a trash can. If you do not have a sharps container, call your pharmacist or healthcare provider to get one. Talk to your pediatrician regarding the use of this medicine in children. Special care may be needed. Overdosage: If you think you have taken too much of this medicine contact a poison control center or emergency room at once. NOTE: This medicine is only for you. Do not share this medicine with others. What if I miss a dose? It is  important not to miss your dose. Call your doctor or health care professional if you miss your dose. If you miss a dose due to an On-body Injector failure or leakage, a new dose should be administered as soon as possible using a single prefilled syringe for manual use. What may interact with this medicine? Interactions have not been studied. Give your health care provider a list of all the medicines, herbs, non-prescription drugs, or dietary supplements you use. Also tell them if you smoke, drink alcohol, or use illegal drugs. Some items may interact with your medicine. This list may not describe all possible interactions. Give your health care provider a list of all the medicines, herbs, non-prescription drugs, or dietary supplements you use. Also tell them if you smoke, drink alcohol, or use illegal drugs. Some items may interact with your medicine. What should I watch for while using this medicine? You may need blood work done while you are taking this medicine. If you are going to need a MRI, CT scan, or other procedure, tell your doctor that you are using this medicine (On-Body Injector only). What side effects may I notice from receiving this medicine? Side effects that you should report to your doctor or health care professional as soon as possible: -allergic reactions like skin rash, itching or hives, swelling of the face, lips, or tongue -dizziness -fever -pain, redness, or irritation at site where injected -pinpoint red spots on the skin -shortness of breath or breathing problems -stomach or side pain, or pain at the shoulder -swelling -tiredness -trouble passing urine Side effects that usually do not require medical attention (report to your doctor   or health care professional if they continue or are bothersome): -bone pain -muscle pain This list may not describe all possible side effects. Call your doctor for medical advice about side effects. You may report side effects to FDA at  1-800-FDA-1088. Where should I keep my medicine? Keep out of the reach of children. Store pre-filled syringes in a refrigerator between 2 and 8 degrees C (36 and 46 degrees F). Do not freeze. Keep in carton to protect from light. Throw away this medicine if it is left out of the refrigerator for more than 48 hours. Throw away any unused medicine after the expiration date. NOTE: This sheet is a summary. It may not cover all possible information. If you have questions about this medicine, talk to your doctor, pharmacist, or health care provider.  2015, Elsevier/Gold Standard. (2013-10-17 16:14:05)  

## 2015-03-12 ENCOUNTER — Telehealth: Payer: Self-pay | Admitting: *Deleted

## 2015-03-12 LAB — SPEP & IFE WITH QIG
ALPHA-2-GLOBULIN: 0.6 g/dL (ref 0.5–0.9)
Albumin ELP: 3.2 g/dL — ABNORMAL LOW (ref 3.8–4.8)
Alpha-1-Globulin: 0.3 g/dL (ref 0.2–0.3)
BETA GLOBULIN: 0.3 g/dL — AB (ref 0.4–0.6)
Beta 2: 0.3 g/dL (ref 0.2–0.5)
Gamma Globulin: 0.5 g/dL — ABNORMAL LOW (ref 0.8–1.7)
IGG (IMMUNOGLOBIN G), SERUM: 535 mg/dL — AB (ref 650–1600)
IgA: 533 mg/dL — ABNORMAL HIGH (ref 68–379)
IgM, Serum: 23 mg/dL — ABNORMAL LOW (ref 41–251)
Total Protein, Serum Electrophoresis: 5.3 g/dL — ABNORMAL LOW (ref 6.1–8.1)

## 2015-03-12 NOTE — Telephone Encounter (Signed)
Patient c/o acid reflux. Is already taking protonix 40mg  daily. Dr Marin Olp would like patient to increase to protonix 40mg  BID. Patient aware.

## 2015-03-13 ENCOUNTER — Other Ambulatory Visit: Payer: Self-pay | Admitting: Family

## 2015-03-13 DIAGNOSIS — C9000 Multiple myeloma not having achieved remission: Secondary | ICD-10-CM

## 2015-03-14 ENCOUNTER — Other Ambulatory Visit: Payer: Self-pay

## 2015-03-14 ENCOUNTER — Encounter (HOSPITAL_COMMUNITY): Payer: Self-pay | Admitting: Emergency Medicine

## 2015-03-14 ENCOUNTER — Emergency Department (HOSPITAL_COMMUNITY): Payer: BLUE CROSS/BLUE SHIELD

## 2015-03-14 ENCOUNTER — Inpatient Hospital Stay (HOSPITAL_COMMUNITY)
Admission: EM | Admit: 2015-03-14 | Discharge: 2015-03-26 | DRG: 808 | Disposition: A | Discharge status: Home / Self Care | Payer: BLUE CROSS/BLUE SHIELD | Attending: Hematology & Oncology | Admitting: Hematology & Oncology

## 2015-03-14 DIAGNOSIS — D701 Agranulocytosis secondary to cancer chemotherapy: Principal | ICD-10-CM | POA: Diagnosis present

## 2015-03-14 DIAGNOSIS — R5081 Fever presenting with conditions classified elsewhere: Secondary | ICD-10-CM | POA: Diagnosis present

## 2015-03-14 DIAGNOSIS — D696 Thrombocytopenia, unspecified: Secondary | ICD-10-CM | POA: Diagnosis not present

## 2015-03-14 DIAGNOSIS — D65 Disseminated intravascular coagulation [defibrination syndrome]: Secondary | ICD-10-CM | POA: Diagnosis present

## 2015-03-14 DIAGNOSIS — Z830 Family history of human immunodeficiency virus [HIV] disease: Secondary | ICD-10-CM | POA: Diagnosis not present

## 2015-03-14 DIAGNOSIS — C9000 Multiple myeloma not having achieved remission: Secondary | ICD-10-CM

## 2015-03-14 DIAGNOSIS — R51 Headache: Secondary | ICD-10-CM | POA: Diagnosis present

## 2015-03-14 DIAGNOSIS — K921 Melena: Secondary | ICD-10-CM | POA: Diagnosis present

## 2015-03-14 DIAGNOSIS — R040 Epistaxis: Secondary | ICD-10-CM | POA: Diagnosis present

## 2015-03-14 DIAGNOSIS — E876 Hypokalemia: Secondary | ICD-10-CM | POA: Diagnosis present

## 2015-03-14 DIAGNOSIS — Z87891 Personal history of nicotine dependence: Secondary | ICD-10-CM | POA: Diagnosis not present

## 2015-03-14 DIAGNOSIS — D72819 Decreased white blood cell count, unspecified: Secondary | ICD-10-CM | POA: Diagnosis not present

## 2015-03-14 DIAGNOSIS — D709 Neutropenia, unspecified: Secondary | ICD-10-CM | POA: Diagnosis present

## 2015-03-14 DIAGNOSIS — Z8249 Family history of ischemic heart disease and other diseases of the circulatory system: Secondary | ICD-10-CM

## 2015-03-14 DIAGNOSIS — R42 Dizziness and giddiness: Secondary | ICD-10-CM | POA: Diagnosis present

## 2015-03-14 DIAGNOSIS — A419 Sepsis, unspecified organism: Secondary | ICD-10-CM

## 2015-03-14 DIAGNOSIS — Z9221 Personal history of antineoplastic chemotherapy: Secondary | ICD-10-CM

## 2015-03-14 DIAGNOSIS — Z79899 Other long term (current) drug therapy: Secondary | ICD-10-CM | POA: Diagnosis not present

## 2015-03-14 DIAGNOSIS — D6959 Other secondary thrombocytopenia: Secondary | ICD-10-CM | POA: Diagnosis present

## 2015-03-14 DIAGNOSIS — T451X5A Adverse effect of antineoplastic and immunosuppressive drugs, initial encounter: Secondary | ICD-10-CM | POA: Insufficient documentation

## 2015-03-14 DIAGNOSIS — R0602 Shortness of breath: Secondary | ICD-10-CM | POA: Diagnosis present

## 2015-03-14 DIAGNOSIS — C9002 Multiple myeloma in relapse: Secondary | ICD-10-CM | POA: Diagnosis present

## 2015-03-14 DIAGNOSIS — D61818 Other pancytopenia: Secondary | ICD-10-CM | POA: Diagnosis not present

## 2015-03-14 DIAGNOSIS — Z79891 Long term (current) use of opiate analgesic: Secondary | ICD-10-CM

## 2015-03-14 DIAGNOSIS — IMO0001 Reserved for inherently not codable concepts without codable children: Secondary | ICD-10-CM | POA: Insufficient documentation

## 2015-03-14 DIAGNOSIS — Z923 Personal history of irradiation: Secondary | ICD-10-CM

## 2015-03-14 DIAGNOSIS — D61810 Antineoplastic chemotherapy induced pancytopenia: Secondary | ICD-10-CM | POA: Insufficient documentation

## 2015-03-14 DIAGNOSIS — D649 Anemia, unspecified: Secondary | ICD-10-CM | POA: Diagnosis present

## 2015-03-14 DIAGNOSIS — R0789 Other chest pain: Secondary | ICD-10-CM | POA: Diagnosis present

## 2015-03-14 DIAGNOSIS — Z91041 Radiographic dye allergy status: Secondary | ICD-10-CM | POA: Diagnosis not present

## 2015-03-14 DIAGNOSIS — Z9484 Stem cells transplant status: Secondary | ICD-10-CM | POA: Diagnosis not present

## 2015-03-14 DIAGNOSIS — D501 Sideropenic dysphagia: Secondary | ICD-10-CM

## 2015-03-14 LAB — URINALYSIS, ROUTINE W REFLEX MICROSCOPIC
BILIRUBIN URINE: NEGATIVE
Glucose, UA: NEGATIVE mg/dL
Hgb urine dipstick: NEGATIVE
Ketones, ur: NEGATIVE mg/dL
Leukocytes, UA: NEGATIVE
Nitrite: NEGATIVE
PH: 6.5 (ref 5.0–8.0)
PROTEIN: 30 mg/dL — AB
Specific Gravity, Urine: 1.024 (ref 1.005–1.030)
UROBILINOGEN UA: 0.2 mg/dL (ref 0.0–1.0)

## 2015-03-14 LAB — BASIC METABOLIC PANEL
Anion gap: 9 (ref 5–15)
BUN: 15 mg/dL (ref 6–20)
CO2: 23 mmol/L (ref 22–32)
CREATININE: 1.06 mg/dL (ref 0.61–1.24)
Calcium: 7.3 mg/dL — ABNORMAL LOW (ref 8.9–10.3)
Chloride: 110 mmol/L (ref 101–111)
GFR calc Af Amer: >60 mL/min (ref >60)
GFR calc non Af Amer: >60 mL/min (ref >60)
GLUCOSE: 146 mg/dL — AB (ref 65–99)
Potassium: 3.6 mmol/L (ref 3.5–5.1)
SODIUM: 142 mmol/L (ref 135–145)

## 2015-03-14 LAB — DIC (DISSEMINATED INTRAVASCULAR COAGULATION) PANEL
APTT: 45 s — AB (ref 24–37)
D DIMER QUANT: 0.7 ug{FEU}/mL — AB (ref 0.00–0.48)
FIBRINOGEN: 610 mg/dL — AB (ref 204–475)
PROTHROMBIN TIME: 13.1 s (ref 11.6–15.2)
SMEAR REVIEW: NONE SEEN

## 2015-03-14 LAB — I-STAT TROPONIN, ED: TROPONIN I, POC: 0 ng/mL (ref 0.00–0.08)

## 2015-03-14 LAB — CBC
HCT: 24.1 % — ABNORMAL LOW (ref 39.0–52.0)
HCT: 25.5 % — ABNORMAL LOW (ref 39.0–52.0)
HEMOGLOBIN: 8.4 g/dL — AB (ref 13.0–17.0)
Hemoglobin: 8.8 g/dL — ABNORMAL LOW (ref 13.0–17.0)
MCH: 31.7 pg (ref 26.0–34.0)
MCH: 30.4 pg (ref 26.0–34.0)
MCHC: 34.9 g/dL (ref 30.0–36.0)
MCHC: 34.5 g/dL (ref 30.0–36.0)
MCV: 90.9 fL (ref 78.0–100.0)
MCV: 88.2 fL (ref 78.0–100.0)
PLATELETS: 8 10*3/uL — AB (ref 150–400)
PLATELETS: 30 10*3/uL — AB (ref 150–400)
RBC: 2.65 MIL/uL — ABNORMAL LOW (ref 4.22–5.81)
RBC: 2.89 MIL/uL — ABNORMAL LOW (ref 4.22–5.81)
RDW: 14.5 % (ref 11.5–15.5)
RDW: 14.1 % (ref 11.5–15.5)
WBC: 0.1 10*3/uL — CL (ref 4.0–10.5)
WBC: 0.1 10*3/uL — AB (ref 4.0–10.5)

## 2015-03-14 LAB — PREPARE RBC (CROSSMATCH)

## 2015-03-14 LAB — TROPONIN I
Troponin I: <0.03 ng/mL (ref <0.031)
Troponin I: <0.03 ng/mL (ref <0.031)

## 2015-03-14 LAB — URINE MICROSCOPIC-ADD ON

## 2015-03-14 LAB — I-STAT CG4 LACTIC ACID, ED: Lactic Acid, Venous: 1.09 mmol/L (ref 0.5–2.0)

## 2015-03-14 LAB — DIC (DISSEMINATED INTRAVASCULAR COAGULATION)PANEL
INR: 0.97 (ref 0.00–1.49)
Platelets: 11 10*3/uL — CL (ref 150–400)

## 2015-03-14 MED ORDER — ACETAMINOPHEN 500 MG PO TABS
1000.0000 mg | ORAL_TABLET | Freq: Four times a day (QID) | ORAL | Status: DC | PRN
  Administered 2015-03-14 – 2015-03-24 (×9): 1000 mg via ORAL
  Filled 2015-03-14 (×9): qty 2

## 2015-03-14 MED ORDER — SACCHAROMYCES BOULARDII 250 MG PO CAPS
250.0000 mg | ORAL_CAPSULE | Freq: Two times a day (BID) | ORAL | Status: DC
  Administered 2015-03-14 – 2015-03-21 (×15): 250 mg via ORAL
  Filled 2015-03-14 (×17): qty 1

## 2015-03-14 MED ORDER — PROCHLORPERAZINE MALEATE 10 MG PO TABS
10.0000 mg | ORAL_TABLET | Freq: Four times a day (QID) | ORAL | Status: DC | PRN
  Filled 2015-03-14: qty 1

## 2015-03-14 MED ORDER — SODIUM CHLORIDE 0.9 % IV SOLN
500.0000 mg | Freq: Four times a day (QID) | INTRAVENOUS | Status: DC
  Administered 2015-03-14 – 2015-03-24 (×40): 500 mg via INTRAVENOUS
  Filled 2015-03-14 (×45): qty 500

## 2015-03-14 MED ORDER — VANCOMYCIN HCL 10 G IV SOLR
1500.0000 mg | INTRAVENOUS | Status: AC
  Administered 2015-03-14: 1500 mg via INTRAVENOUS
  Filled 2015-03-14: qty 1500

## 2015-03-14 MED ORDER — PIPERACILLIN-TAZOBACTAM 3.375 G IVPB
3.3750 g | INTRAVENOUS | Status: AC
  Administered 2015-03-14: 3.375 g via INTRAVENOUS
  Filled 2015-03-14: qty 50

## 2015-03-14 MED ORDER — PIPERACILLIN-TAZOBACTAM 4.5 G IVPB: 4.5000 g | Freq: Once | INTRAVENOUS | Status: DC

## 2015-03-14 MED ORDER — MONTELUKAST SODIUM 10 MG PO TABS
10.0000 mg | ORAL_TABLET | Freq: Every day | ORAL | Status: DC
  Administered 2015-03-14 – 2015-03-19 (×5): 10 mg via ORAL
  Filled 2015-03-14 (×6): qty 1

## 2015-03-14 MED ORDER — FAMCICLOVIR 500 MG PO TABS
500.0000 mg | ORAL_TABLET | Freq: Every day | ORAL | Status: DC
  Administered 2015-03-14 – 2015-03-26 (×13): 500 mg via ORAL
  Filled 2015-03-14 (×13): qty 1

## 2015-03-14 MED ORDER — LEVOFLOXACIN IN D5W 750 MG/150ML IV SOLN: 750.0000 mg | INTRAVENOUS | Status: DC

## 2015-03-14 MED ORDER — GABAPENTIN 300 MG PO CAPS
600.0000 mg | ORAL_CAPSULE | Freq: Three times a day (TID) | ORAL | Status: DC
  Administered 2015-03-14 – 2015-03-26 (×37): 600 mg via ORAL
  Filled 2015-03-14 (×39): qty 2

## 2015-03-14 MED ORDER — HYDROMORPHONE HCL 2 MG PO TABS
4.0000 mg | ORAL_TABLET | ORAL | Status: DC | PRN
  Filled 2015-03-14: qty 2

## 2015-03-14 MED ORDER — SODIUM BICARBONATE/SODIUM CHLORIDE MOUTHWASH
Freq: Four times a day (QID) | OROMUCOSAL | Status: DC
  Administered 2015-03-14 – 2015-03-15 (×2): via OROMUCOSAL
  Administered 2015-03-15: 1 via OROMUCOSAL
  Administered 2015-03-16 – 2015-03-17 (×5): via OROMUCOSAL
  Administered 2015-03-17: 1 via OROMUCOSAL
  Administered 2015-03-17: 13:00:00 via OROMUCOSAL
  Administered 2015-03-17: 1 via OROMUCOSAL
  Administered 2015-03-18 (×3): via OROMUCOSAL
  Administered 2015-03-19 (×2): 1 via OROMUCOSAL
  Administered 2015-03-20 – 2015-03-24 (×12): via OROMUCOSAL
  Administered 2015-03-24 – 2015-03-25 (×2): 1 via OROMUCOSAL
  Administered 2015-03-25: 05:00:00 via OROMUCOSAL
  Administered 2015-03-25: 1 via OROMUCOSAL
  Administered 2015-03-26 (×2): via OROMUCOSAL
  Filled 2015-03-14 (×2): qty 1000

## 2015-03-14 MED ORDER — FENTANYL 75 MCG/HR TD PT72
75.0000 ug | MEDICATED_PATCH | TRANSDERMAL | Status: DC
  Administered 2015-03-14 – 2015-03-26 (×5): 75 ug via TRANSDERMAL
  Filled 2015-03-14 (×5): qty 1

## 2015-03-14 MED ORDER — METOCLOPRAMIDE HCL 10 MG PO TABS: 10.0000 mg | ORAL_TABLET | Freq: Four times a day (QID) | ORAL | Status: DC | PRN

## 2015-03-14 MED ORDER — TEMAZEPAM 7.5 MG PO CAPS: 22.5000 mg | ORAL_CAPSULE | Freq: Every evening | ORAL | Status: DC | PRN

## 2015-03-14 MED ORDER — LIDOCAINE 5 % EX PTCH
1.0000 | MEDICATED_PATCH | CUTANEOUS | Status: DC
  Administered 2015-03-14 – 2015-03-18 (×3): 1 via TRANSDERMAL
  Filled 2015-03-14 (×10): qty 1

## 2015-03-14 MED ORDER — LORAZEPAM 0.5 MG PO TABS: 0.5000 mg | ORAL_TABLET | Freq: Four times a day (QID) | ORAL | Status: DC | PRN

## 2015-03-14 MED ORDER — PANTOPRAZOLE SODIUM 40 MG PO TBEC
40.0000 mg | DELAYED_RELEASE_TABLET | Freq: Every day | ORAL | Status: DC
  Administered 2015-03-14 – 2015-03-26 (×13): 40 mg via ORAL
  Filled 2015-03-14 (×14): qty 1

## 2015-03-14 MED ORDER — ONDANSETRON 4 MG PO TBDP: 8.0000 mg | ORAL_TABLET | Freq: Three times a day (TID) | ORAL | Status: DC | PRN

## 2015-03-14 MED ORDER — BIOTENE DRY MOUTH MT LIQD
15.0000 mL | Freq: Every day | OROMUCOSAL | Status: DC
  Administered 2015-03-14 – 2015-03-26 (×56): 15 mL via OROMUCOSAL

## 2015-03-14 MED ORDER — HYDROMORPHONE HCL 2 MG PO TABS
4.0000 mg | ORAL_TABLET | ORAL | Status: DC | PRN
  Administered 2015-03-15: 4 mg via ORAL
  Filled 2015-03-14: qty 2

## 2015-03-14 MED ORDER — SODIUM CHLORIDE 0.9 % IV SOLN
Freq: Once | INTRAVENOUS | Status: AC
  Administered 2015-03-18: 22:00:00 via INTRAVENOUS

## 2015-03-14 MED ORDER — SODIUM CHLORIDE 0.9 % IV SOLN
10.0000 mL/h | Freq: Once | INTRAVENOUS | Status: AC
  Administered 2015-03-14: 10 mL/h via INTRAVENOUS

## 2015-03-14 MED ORDER — BISACODYL 5 MG PO TBEC
5.0000 mg | DELAYED_RELEASE_TABLET | Freq: Every day | ORAL | Status: DC | PRN
  Administered 2015-03-14: 5 mg via ORAL
  Filled 2015-03-14 (×2): qty 1

## 2015-03-14 NOTE — ED Notes (Signed)
Bed: WA11 Expected date:  Expected time:  Means of arrival:  Comments: t-2 

## 2015-03-14 NOTE — ED Notes (Signed)
EKG given to EDP,Oni,MD., for review. 

## 2015-03-14 NOTE — Progress Notes (Signed)
ANTIBIOTIC CONSULT NOTE - INITIAL  Pharmacy Consult for Primaxin Indication: Febrile Neutropenia  Allergies  Allergen Reactions  . Gadolinium Derivatives Nausea And Vomiting    Pt became very nauseous and got sick.     Patient Measurements: Height: _0  (180.3 cm) Weight: 180 lb (81.647 kg) IBW/kg (Calculated) : 75.3  Vital Signs: Temp: 99.4 F (37.4 C) (08/13 0203) Temp Source: Oral (08/13 0203) BP: 139/79 mmHg (08/13 0400) Pulse Rate: 93 (08/13 0400) Intake/Output from previous day:   Intake/Output from this shift:    Labs:  Recent Labs  03/14/15 0239  WBC 0.1*  HGB 8.4*  PLT 8*  CREATININE 1.06   Estimated Creatinine Clearance: 96.7 mL/min (by C-G formula based on Cr of 1.06). No results for input(s): VANCOTROUGH, VANCOPEAK, VANCORANDOM, GENTTROUGH, GENTPEAK, GENTRANDOM, TOBRATROUGH, TOBRAPEAK, TOBRARND, AMIKACINPEAK, AMIKACINTROU, AMIKACIN in the last 72 hours.   Microbiology: Recent Results (from the past 720 hour(s))  Culture, blood (routine x 2) Call MD if unable to obtain prior to antibiotics being given     Status: None   Collection Time: 02/28/15  1:20 AM  Result Value Ref Range Status   Specimen Description BLOOD LEFT HAND  Final   Special Requests BOTTLES DRAWN AEROBIC ONLY 5CC  Final   Culture   Final    NO GROWTH 5 DAYS Performed at [facility]    Report Status 03/05/2015 FINAL  Final  Culture, blood (routine x 2) Call MD if unable to obtain prior to antibiotics being given     Status: None   Collection Time: 02/28/15  1:22 AM  Result Value Ref Range Status   Specimen Description BLOOD LEFT ANTECUBITAL  Final   Special Requests BOTTLES DRAWN AEROBIC ONLY 5CC  Final   Culture   Final    NO GROWTH 5 DAYS Performed at [facility]    Report Status 03/05/2015 FINAL  Final  Culture, sputum-assessment     Status: None   Collection Time: 03/01/15  2:10 PM  Result Value Ref Range Status   Specimen Description SPUTUM  Final   Special Requests NONE  Final   Sputum evaluation   Final    MICROSCOPIC FINDINGS SUGGEST THAT THIS SPECIMEN IS NOT REPRESENTATIVE OF LOWER RESPIRATORY SECRETIONS. PLEASE RECOLLECT. NOTIFIED JOHNSON,A AT [id number] ON [id number] BY HOOKER,B    Report Status 03/01/2015 FINAL  Final    Medical History: Past Medical History  Diagnosis Date  . History of radiation therapy 02/07/13- 02/26/13    lower L spine, upper sacrum, 35 gray in 14 fractions  . FH: chemotherapy     Dr. Burney Gauze  . Cancer   . Multiple myeloma dx'd 01/2013  . GERD (gastroesophageal reflux disease)     Medications:  Scheduled:   Infusions:  . sodium chloride    . piperacillin-tazobactam (ZOSYN)  IV 3.375 g (03/14/15 0348)  . vancomycin 1,500 mg (03/14/15 0349)   Assessment:  44 yr male with multiple myeloma undergoing chemotherapy with complaint of chest pain x 3 weeks. Has been on Levofloxacin 513m daily for pneumonia (last dose 8/12).  Left sided epistaxis and blood in stool noted.  Vancomycin 1503mand Zosyn 3.375gm given in ED ~ 03:50 this AM  Patient admitted for sepsis with active bleeding  Oncology consulted  Pharmacy consulted to dose Primaxin for treatment of febrile neutropenia  Goal of Therapy:  Eradication of infection Dosing based on renal function  Plan:  Follow up culture results  Primaxin 50046mV q6h  Rhonda Vangieson, LeaMarylin Crosby  Pasty Arch, PharmD 03/14/2015,4:04 AM

## 2015-03-14 NOTE — ED Provider Notes (Signed)
CSN: [id number]   Arrival date & time 03/14/15 0148  History  This chart was scribed for  Mark Balls, MD by Altamease Oiler, ED Scribe. This patient was seen in room WA11/WA11 and the patient's care was started at 3:04 AM.  Chief Complaint  Patient presents with  . Shortness of Breath  . Chest Pain    HPI The history is provided by the patient and the spouse. No language interpreter was used.   Mark [name] is a 44 y.o. male with PMHx of multiple myeloma on chemotherapy who presents to the Emergency Department complaining of left-sided chest pain with onset 3 weeks ago. The non-radiating pain is described as sharp/stabbing and exacerbated by deep breathing. He has been evaluated for this pain and started on abx for pneumonia. He states that he was supposed to have a CT scan to look for PE but his provider changed the treatment plan before it was done. Associated symptoms include sweating and SOB. Also complains of left-sided epistaxis for 1-2 hours and bright red blood in his stool. Pt denies nausea, vomiting, and dark stool. Last chemotherapy session was last 03/03/09. He had a platelet transfusion on 03/09/15 and a blood transfusion 1 week ago.   Past Medical History  Diagnosis Date  . History of radiation therapy 02/07/13- 02/26/13    lower L spine, upper sacrum, 35 gray in 14 fractions  . FH: chemotherapy     Dr. Burney Gauze  . Cancer   . Multiple myeloma dx'd 01/2013  . GERD (gastroesophageal reflux disease)     Past Surgical History  Procedure Laterality Date  . Portacath placement    . 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 Kyphoplasty  05/20/14  . Lumbar laminectomy/decompression microdiscectomy N/A 09/25/2014    Procedure: LUMBAR LAMINECTOMY/DECOMPRESSION MICRODISCECTOMY;  Surgeon: Sinclair Ship, MD;  Location: [facility];  Service: Orthopedics;  Laterality: N/A;  Lumbar 4-5, L5-S1  decompression  . Esophagogastroduodenoscopy N/A 02/06/2015    Procedure:  ESOPHAGOGASTRODUODENOSCOPY (EGD);  Surgeon: Teena Irani, MD;  Location: [facility];  Service: Endoscopy;  Laterality: N/A;    Family History  Problem Relation Age of Onset  . Diabetic kidney disease Mother   . Hypertension Mother   . Heart attack Mother   . Kidney failure Mother   . Coronary artery disease Mother   . HIV Father     Social History  Substance Use Topics  . Smoking status: Former Smoker -- 1.00 packs/day for 15 years    Types: Cigarettes    Start date: 06/29/1988    Quit date: 06/29/2003  . Smokeless tobacco: Never Used     Comment: quit smoking 10 years ago  . Alcohol Use: No     Review of Systems 10 Systems reviewed and all are negative for acute change except as noted in the HPI.  Home Medications   Prior to Admission medications   Medication Sig Start Date End Date Taking? Authorizing Provider  acetaminophen (TYLENOL) 500 MG tablet Take 1,000 mg by mouth every 6 (six) hours as needed for moderate pain or headache (headache).    Yes Historical Provider, MD  bisacodyl (DULCOLAX) 5 MG EC tablet Take 5 mg by mouth daily as needed for moderate constipation (constipation).    Yes Historical Provider, MD  famciclovir (FAMVIR) 500 MG tablet Take 1 tablet (500 mg total) by mouth daily. 02/11/15  Yes [name], MD  gabapentin (NEURONTIN) 300 MG capsule Take 2 capsules (600  mg total) by mouth 3 (three) times daily. 11/20/14  Yes [name], NP  HYDROmorphone (DILAUDID) 4 MG tablet Take 1-2, IF NEEDED, for pain every 4 hrs for pain Patient taking differently: Take 4-8 mg by mouth every 4 (four) hours as needed for moderate pain or severe pain.  02/25/15  Yes [name], MD  levofloxacin (LEVAQUIN) 500 MG tablet Take 1 tablet (500 mg total) by mouth daily. 03/09/15  Yes Charlynne Cousins, MD  LORazepam (ATIVAN) 0.5 MG tablet Take 1 tablet (0.5 mg total) by mouth every 6 (six) hours as needed (Nausea or vomiting). 02/18/15  Yes [name], MD   metoCLOPramide (REGLAN) 10 MG tablet Take 1 tablet (10 mg total) by mouth every 6 (six) hours as needed for nausea or vomiting. 01/27/15  Yes [name], MD  montelukast (SINGULAIR) 10 MG tablet Take 1 tablet (10 mg total) by mouth at bedtime. 02/25/15  Yes [name], MD  ondansetron (ZOFRAN ODT) 8 MG disintegrating tablet Take 1 tablet (8 mg total) by mouth every 8 (eight) hours as needed for nausea or vomiting. 02/04/15  Yes [name], MD  pantoprazole (PROTONIX) 40 MG tablet Take 1 tablet (40 mg total) by mouth daily. 02/04/15  Yes [name], MD  prochlorperazine (COMPAZINE) 10 MG tablet Take 1 tablet (10 mg total) by mouth every 6 (six) hours as needed (Nausea or vomiting). 02/18/15  Yes [name], MD  saccharomyces boulardii (FLORASTOR) 250 MG capsule Take 1 capsule (250 mg total) by mouth 2 (two) times daily. 03/09/15  Yes Charlynne Cousins, MD  temazepam (RESTORIL) 22.5 MG capsule Take 1 capsule (22.5 mg total) by mouth at bedtime as needed for sleep. 02/04/15  Yes [name], MD  dexamethasone (DECADRON) 4 MG tablet Take 5 pills once a week with food for 3 weeks in a row then off 1 week. Patient not taking: Reported on 03/10/2015 02/25/15   [name], MD  fentaNYL (DURAGESIC - DOSED MCG/HR) 75 MCG/HR Place 1 patch (75 mcg total) onto the skin every 3 (three) days. 12/09/14   [name], MD  fluconazole (DIFLUCAN) 100 MG tablet Take 1 tablet (100 mg total) by mouth daily. Patient not taking: Reported on 03/14/2015 03/09/15   Charlynne Cousins, MD  Insulin Detemir (LEVEMIR FLEXPEN) 100 UNIT/ML Pen Inject 15 Units into the skin 2 (two) times daily. Patient not taking: Reported on 03/10/2015 03/09/15   Charlynne Cousins, MD  Insulin Pen Needle 31G X 6 MM MISC 1 Device by Does not apply route 2 (two) times daily. Patient not taking: Reported on 03/10/2015 03/09/15   Charlynne Cousins, MD  Ipratropium-Albuterol (COMBIVENT) 20-100 MCG/ACT AERS respimat Inhale 1 puff into the  lungs every 6 (six) hours. Patient not taking: Reported on 03/10/2015 02/25/15   [name], MD  lidocaine-prilocaine (EMLA) cream Apply to affected area once 02/18/15   [name], MD    Allergies  Gadolinium derivatives  Triage Vitals: BP 145/82 mmHg  Pulse 111  Temp(Src) 99.4 F (37.4 C) (Oral)  Resp 22  Ht '5\' 11"'  (1.803 m)  Wt 198 lb (89.812 kg)  BMI 27.63 kg/m2  SpO2 99%  Physical Exam  Constitutional: He is oriented to person, place, and time. Vital signs are normal. He appears well-developed and well-nourished.  Non-toxic appearance. He appears ill. No distress.  HENT:  Head: Normocephalic and atraumatic.  Mouth/Throat: Oropharynx is clear and moist. No oropharyngeal exudate.  Epistaxis in left naris with paper towel packing in place  Eyes: Conjunctivae and EOM are normal. Pupils are equal, round, and reactive to light. No scleral icterus.  Neck: Normal range of motion. Neck supple. No tracheal deviation, no edema, no erythema and normal range of motion present. No thyroid mass and no thyromegaly present.  Cardiovascular: Normal rate, regular rhythm, S1 normal, S2 normal, intact distal pulses and normal pulses.  Exam reveals no gallop and no friction rub.   No murmur heard. Pulses:      Radial pulses are 2+ on the right side, and 2+ on the left side.       Dorsalis pedis pulses are 2+ on the right side, and 2+ on the left side.  Tachycardia  Pulmonary/Chest: Effort normal and breath sounds normal. No respiratory distress. He has no wheezes. He has no rhonchi. He has no rales.  Abdominal: Soft. Normal appearance and bowel sounds are normal. He exhibits no distension, no ascites and no mass. There is no hepatosplenomegaly. There is no tenderness. There is no rebound, no guarding and no CVA tenderness.  Musculoskeletal: Normal range of motion. He exhibits no edema or tenderness.  Lymphadenopathy:    He has no cervical adenopathy.  Neurological: He is alert and oriented  to person, place, and time. He has normal strength. No cranial nerve deficit or sensory deficit.  Skin: Skin is warm, dry and intact. No petechiae and no rash noted. He is not diaphoretic. No erythema. No pallor.  Psychiatric: He has a normal mood and affect. His behavior is normal. Judgment normal.  Nursing note and vitals reviewed.   ED Course  Procedures   DIAGNOSTIC STUDIES: Oxygen Saturation is 99% on RA, normal by my interpretation.    COORDINATION OF CARE: 3:08 AM Discussed treatment plan which includes lab work, CXR, EKG, and IVF with pt at bedside and pt agreed to plan.  Labs Review-  Labs Reviewed  BASIC METABOLIC PANEL - Abnormal; Notable for the following:    Glucose, Bld 146 (*)    Calcium 7.3 (*)    All other components within normal limits  CBC - Abnormal; Notable for the following:    WBC 0.1 (*)    RBC 2.65 (*)    Hemoglobin 8.4 (*)    HCT 24.1 (*)    Platelets 8 (*)    All other components within normal limits  CULTURE, BLOOD (ROUTINE X 2)  CULTURE, BLOOD (ROUTINE X 2)  URINE CULTURE  URINALYSIS, ROUTINE W REFLEX MICROSCOPIC (NOT AT [facility])  DIC (DISSEMINATED INTRAVASCULAR COAGULATION) PANEL  TROPONIN I  TROPONIN I  TROPONIN I  I-STAT TROPOININ, ED  I-STAT CG4 LACTIC ACID, ED  TYPE AND SCREEN  PREPARE PLATELET PHERESIS  PREPARE PLATELET PHERESIS  PREPARE RBC (CROSSMATCH)    Imaging Review Dg Chest 2 View  03/14/2015   CLINICAL DATA:  Shortness breath.  Chest pain.  Multiple myeloma.  EXAM: CHEST - 2 VIEW  COMPARISON:  Two-view chest x-ray 03/06/2015  FINDINGS: Heart size is normal. A right IJ Port-A-Cath is stable. The lung volumes are somewhat low. No focal airspace disease is present. There is no edema or effusion to suggest failure. The visualized soft tissues are within normal limits. Focal kyphosis in the lower thoracic spine is stable.  IMPRESSION: 1. No acute cardiopulmonary disease or significant interval change. 2. Persistent low lung volumes. 3.  Stable right IJ Port-A-Cath.   Electronically Signed   By: San Morelle M.D.   On: 03/14/2015  02:46     EKG Interpretation  Date/Time:    Ventricular Rate:    PR Interval:    QRS Duration:   QT Interval:    QTC Calculation:   R Axis:     Text Interpretation:              MDM   Final diagnoses:  None    Patient presents to the emergency department for chest pain and shortness of breath. He states he has had this since his last admission was diagnosed with pneumonia. He also may have had a pulmonary embolism however he states he did not get a CT scan for this. Patient is tachycardic to 111.  He has taken his antibody status is not improved. Laboratory studies reveal a white blood cell count is 0.1 and platelets of 8. Patient does not have an oral fever however I didn't initiate code sepsis. He was given vancomycin and Zosyn emergency department. Blood cultures were obtained prior to anti-biotic administration. Tachycardia has resolved after IV fluids, repeat heart rate is 88.  Type and screen was sent. Patient is actively bleeding in the room with left neck epistaxis. He states he has had blood in his bowel movements last couple days as well. Dr. Alcario Drought spoke with oncology on call who recommends for irradiated pheresis platelets to be transfused. This was ordered as well as 2 units of packed red blood cells.     CRITICAL CARE Performed by: Mark [name]   Total critical care time: 70mn - thrombocytopenia with active bleeding, sepsis  Critical care time was exclusive of separately billable procedures and treating other patients.  Critical care was necessary to treat or prevent imminent or life-threatening deterioration.  Critical care was time spent personally by me on the following activities: development of treatment plan with patient and/or surrogate as well as nursing, discussions with consultants, evaluation of patient's response to treatment, examination of  patient, obtaining history from patient or surrogate, ordering and performing treatments and interventions, ordering and review of laboratory studies, ordering and review of radiographic studies, pulse oximetry and re-evaluation of patient's condition.   I personally performed the services described in this documentation, which was scribed in my presence. The recorded information has been reviewed and is accurate.    AEverlene Balls MD 03/14/15 [phone]

## 2015-03-14 NOTE — ED Notes (Signed)
Pt arrived to the ED with a complaint of shortness of breath. Pt also has chest pain.  Pt states he has had a 96 hour chemo treatment last week and has had shortness of breath since the treatment.  Pt states he has had left sided chest pain for the same amount of time.  Pt was given antibiotics but symptoms have not gone away. Pt pain is located in the left side of the chest. Pt has small amounts of epistaxis.

## 2015-03-14 NOTE — ED Notes (Signed)
Lab called to report that patient's blood is positive for antibodies and ordered blood will be delayed.

## 2015-03-14 NOTE — ED Notes (Signed)
Pt

## 2015-03-14 NOTE — H&P (Signed)
[facility] History and Physical  Mark [name] IEP:[id number] DOB: July 04, 1971 DOA: 03/14/2015  Referring physician: EDP PCP: No PCP Per Patient   Chief Complaint: SOB   HPI: Mark [name] is a 44 y.o. male undergoing chemotherapy for MM, last treatment ended on Monday.  Patient has had 3 week history of L sided chest pain and SOB.  Initially diagnosed as HCAP he was treated with levaqin as outpatient.  Was also supposed to have CT scan to look for PE but apparently provider changed plan with this.  Tonight he developed left sided epistaxis for 1-2 hours and BRB in stools.  Patient denies nausea, vomiting, and dark stools.  Review of Systems: Systems reviewed.  As above, otherwise negative  Past Medical History  Diagnosis Date  . History of radiation therapy 02/07/13- 02/26/13    lower L spine, upper sacrum, 35 gray in 14 fractions  . FH: chemotherapy     Dr. Burney Gauze  . Cancer   . Multiple myeloma dx'd 01/2013  . GERD (gastroesophageal reflux disease)    Past Surgical History  Procedure Laterality Date  . Portacath placement    . 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 Kyphoplasty  05/20/14  . Lumbar laminectomy/decompression microdiscectomy N/A 09/25/2014    Procedure: LUMBAR LAMINECTOMY/DECOMPRESSION MICRODISCECTOMY;  Surgeon: Sinclair Ship, MD;  Location: [facility];  Service: Orthopedics;  Laterality: N/A;  Lumbar 4-5, L5-S1  decompression  . Esophagogastroduodenoscopy N/A 02/06/2015    Procedure: ESOPHAGOGASTRODUODENOSCOPY (EGD);  Surgeon: Teena Irani, MD;  Location: [facility];  Service: Endoscopy;  Laterality: N/A;   Social History:  reports that he quit smoking about 11 years ago. His smoking use included Cigarettes. He started smoking about 26 years ago. He has a 15 pack-year smoking history. He has never used smokeless tobacco. He reports that he does not drink alcohol or use illicit drugs.  Allergies  Allergen Reactions  . Gadolinium  Derivatives Nausea And Vomiting    Pt became very nauseous and got sick.     Family History  Problem Relation Age of Onset  . Diabetic kidney disease Mother   . Hypertension Mother   . Heart attack Mother   . Kidney failure Mother   . Coronary artery disease Mother   . HIV Father      Prior to Admission medications   Medication Sig Start Date End Date Taking? Authorizing Provider  acetaminophen (TYLENOL) 500 MG tablet Take 1,000 mg by mouth every 6 (six) hours as needed for moderate pain or headache (headache).    Yes Historical Provider, MD  bisacodyl (DULCOLAX) 5 MG EC tablet Take 5 mg by mouth daily as needed for moderate constipation (constipation).    Yes Historical Provider, MD  famciclovir (FAMVIR) 500 MG tablet Take 1 tablet (500 mg total) by mouth daily. 02/11/15  Yes [name], MD  gabapentin (NEURONTIN) 300 MG capsule Take 2 capsules (600 mg total) by mouth 3 (three) times daily. 11/20/14  Yes [name], NP  HYDROmorphone (DILAUDID) 4 MG tablet Take 1-2, IF NEEDED, for pain every 4 hrs for pain Patient taking differently: Take 4-8 mg by mouth every 4 (four) hours as needed for moderate pain or severe pain.  02/25/15  Yes [name], MD  LORazepam (ATIVAN) 0.5 MG tablet Take 1 tablet (0.5 mg total) by mouth every 6 (six) hours as needed (Nausea or vomiting). 02/18/15  Yes [name], MD  metoCLOPramide (REGLAN) 10 MG tablet  Take 1 tablet (10 mg total) by mouth every 6 (six) hours as needed for nausea or vomiting. 01/27/15  Yes [name], MD  montelukast (SINGULAIR) 10 MG tablet Take 1 tablet (10 mg total) by mouth at bedtime. 02/25/15  Yes [name], MD  ondansetron (ZOFRAN ODT) 8 MG disintegrating tablet Take 1 tablet (8 mg total) by mouth every 8 (eight) hours as needed for nausea or vomiting. 02/04/15  Yes [name], MD  pantoprazole (PROTONIX) 40 MG tablet Take 1 tablet (40 mg total) by mouth daily. 02/04/15  Yes [name], MD   prochlorperazine (COMPAZINE) 10 MG tablet Take 1 tablet (10 mg total) by mouth every 6 (six) hours as needed (Nausea or vomiting). 02/18/15  Yes [name], MD  saccharomyces boulardii (FLORASTOR) 250 MG capsule Take 1 capsule (250 mg total) by mouth 2 (two) times daily. 03/09/15  Yes Charlynne Cousins, MD  temazepam (RESTORIL) 22.5 MG capsule Take 1 capsule (22.5 mg total) by mouth at bedtime as needed for sleep. 02/04/15  Yes [name], MD  dexamethasone (DECADRON) 4 MG tablet Take 5 pills once a week with food for 3 weeks in a row then off 1 week. Patient not taking: Reported on 03/10/2015 02/25/15   [name], MD  fentaNYL (DURAGESIC - DOSED MCG/HR) 75 MCG/HR Place 1 patch (75 mcg total) onto the skin every 3 (three) days. 12/09/14   [name], MD  fluconazole (DIFLUCAN) 100 MG tablet Take 1 tablet (100 mg total) by mouth daily. Patient not taking: Reported on 03/14/2015 03/09/15   Charlynne Cousins, MD  Insulin Detemir (LEVEMIR FLEXPEN) 100 UNIT/ML Pen Inject 15 Units into the skin 2 (two) times daily. Patient not taking: Reported on 03/10/2015 03/09/15   Charlynne Cousins, MD  Insulin Pen Needle 31G X 6 MM MISC 1 Device by Does not apply route 2 (two) times daily. Patient not taking: Reported on 03/10/2015 03/09/15   Charlynne Cousins, MD  Ipratropium-Albuterol (COMBIVENT) 20-100 MCG/ACT AERS respimat Inhale 1 puff into the lungs every 6 (six) hours. Patient not taking: Reported on 03/10/2015 02/25/15   [name], MD  lidocaine-prilocaine (EMLA) cream Apply to affected area once 02/18/15   [name], MD   Physical Exam: Filed Vitals:   03/14/15 0400  BP: 139/79  Pulse: 93  Temp:   Resp: 20    BP 139/79 mmHg  Pulse 93  Temp(Src) 99.4 F (37.4 C) (Oral)  Resp 20  Ht '5\' 11"'  (1.803 m)  Wt 81.647 kg (180 lb)  BMI 25.12 kg/m2  SpO2 100%  General Appearance:    Alert, oriented, no distress, appears stated age  Head:    Normocephalic, atraumatic  Eyes:     PERRL, EOMI, sclera non-icteric        Nose:   Nares without drainage or epistaxis. Mucosa, turbinates normal  Throat:   Moist mucous membranes. Oropharynx without erythema or exudate.  Neck:   Supple. No carotid bruits.  No thyromegaly.  No lymphadenopathy.   Back:     No CVA tenderness, no spinal tenderness  Lungs:     Clear to auscultation bilaterally, without wheezes, rhonchi or rales  Chest wall:    No tenderness to palpitation  Heart:    Regular rate and rhythm without murmurs, gallops, rubs  Abdomen:     Soft, non-tender, nondistended, normal bowel sounds, no organomegaly  Genitalia:    deferred  Rectal:    deferred  Extremities:   No clubbing, cyanosis or edema.  Pulses:   2+ and symmetric all extremities  Skin:   Skin color, texture, turgor normal, no rashes or lesions  Lymph nodes:   Cervical, supraclavicular, and axillary nodes normal  Neurologic:   CNII-XII intact. Normal strength, sensation and reflexes      throughout    Labs on Admission:  Basic Metabolic Panel:  Recent Labs Lab 03/07/15 0500 03/08/15 0500 03/09/15 0550 03/10/15 1136 03/14/15 0239  NA 136  135 139 138 143 142  K 5.0  5.0 4.1 3.5 3.5 3.6  CL 109  109 112* 108 110* 110  CO2 20*  19* '22 25 24 23  ' GLUCOSE 310*  309* 127* 120* 176* 146*  BUN 26*  26* 27* 26* 18 15  CREATININE 1.19  1.16 1.01 0.94 0.9 1.06  CALCIUM 9.5  9.4 9.0 8.6* 8.6 7.3*  MG 2.0  --   --   --   --   PHOS 1.9* 3.0 3.3  --   --    Liver Function Tests:  Recent Labs Lab 03/07/15 0500 03/08/15 0500 03/09/15 0550 03/10/15 1136  AST  --   --   --  24  ALT  --   --   --  24  ALKPHOS  --   --   --  62  BILITOT  --   --   --  0.80  PROT  --   --   --  5.7*  ALBUMIN 3.4* 3.5 3.2*  --    No results for input(s): LIPASE, AMYLASE in the last 168 hours. No results for input(s): AMMONIA in the last 168 hours. CBC:  Recent Labs Lab 03/07/15 0500 03/08/15 0500 03/09/15 0550 03/10/15 1136 03/14/15 0239  WBC 7.9  6.4 3.0* 2.7* 0.1*  NEUTROABS  --   --   --  2.6  --   HGB 8.0* 9.3* 9.4* 9.6* 8.4*  HCT 24.2* 26.9* 27.1* 27.0* 24.1*  MCV 92.4 91.5 90.6 92 90.9  PLT 56* 39* 28* 41* 8*   Cardiac Enzymes: No results for input(s): CKTOTAL, CKMB, CKMBINDEX, TROPONINI in the last 168 hours.  BNP (last 3 results) No results for input(s): PROBNP in the last 8760 hours. CBG:  Recent Labs Lab 03/08/15 2127 03/08/15 2242 03/09/15 0110 03/09/15 0736 03/09/15 1141  GLUCAP 193* 185* 132* 233* 149*    Radiological Exams on Admission: Dg Chest 2 View  03/14/2015   CLINICAL DATA:  Shortness breath.  Chest pain.  Multiple myeloma.  EXAM: CHEST - 2 VIEW  COMPARISON:  Two-view chest x-ray 03/06/2015  FINDINGS: Heart size is normal. A right IJ Port-A-Cath is stable. The lung volumes are somewhat low. No focal airspace disease is present. There is no edema or effusion to suggest failure. The visualized soft tissues are within normal limits. Focal kyphosis in the lower thoracic spine is stable.  IMPRESSION: 1. No acute cardiopulmonary disease or significant interval change. 2. Persistent low lung volumes. 3. Stable right IJ Port-A-Cath.   Electronically Signed   By: San Morelle M.D.   On: 03/14/2015 02:46    EKG: Independently reviewed.  Assessment/Plan Active Problems:   Multiple myeloma   Anemia   Thrombocytopenia   Febrile neutropenia   1. Neutropenia - with tachycardia, T of 99.4 1. Dr. Marin Olp wants patient to be put on empiric imipenem 2. Cultures pending 3. Tele monitor 4. Neutropenic precautions 5. Already got neulasta with chemo treatment 2. Thrombocytopenia - Symptomatic with  bleeding 1. Transfusing 1 unit irradiated pheresed platelets per Dr. Antonieta Pert instructions 2. CBC post transfusions 3. Anemia - 1. Transfusing 2 units PRBC irradiated per Dr. Dicie Beam instructions 2. CBC post transfusions 4. Multiple Myeloma - 1. S/P chemotherapy on Monday  Dr. Marin Olp consulted and will  see patient later today.  Code Status: Full  Family Communication: Family at bedside Disposition Plan: Admit to inpatient   Time spent: 75 min  Juleon Narang M. [facility] Pager [phone]  If 7AM-7PM, please contact the day team taking care of the patient [email or web address] Password [facility] 03/14/2015, 4:29 AM

## 2015-03-14 NOTE — ED Notes (Addendum)
Natasha from lab called to report a critical WBC of 0.1 and critical Platelets of 8. RN Antony Haste made aware.

## 2015-03-14 NOTE — Progress Notes (Signed)
[facility] PROGRESS NOTE  Mark [name] LRJ:[id number] DOB: 10/08/1970 DOA: 03/14/2015 PCP: No PCP Per Patient  brief narrative 44 year old male with relapsed multiple myeloma undergoing chemotherapy recent hospitalization for healthcare associated pneumonia with hemoptysis and underwent salvage chemotherapy for refractory hypercalcemia, (last dose on 8/8, received Neulasta on 8/2) was discharged home in stable condition but returned back with epistaxis and laboratory blood in stool associated with shortness of breath patient also has been complaining of left-sided chest pain for almost a month. Found to have a low-grade fever and pancytopenia. He was admitted on telemetry and ordered for PRBC and platelet transfusion. Oncology following.   Assessment/Plan: Pancytopenia Continue neutropenic precautions. On empiric antibiotics (vancomycin and Primaxin). Continue neutropenic precautions. Continue famciclovir.  ? Early DIC Patient has elevated APTT, fibrinogen and d-dimer. Monitor with closely with PRBC and platelet transfusions.   Left-sided chest wall pain with shortness of breath Chest x-ray unremarkable. Head CT angiogram of the chest one month back for similar symptoms and was negative for PE. Pain is musculoskeletal. Continue with fentanyl patch, ordered Lidoderm patch and will increase frequency of diluadid. O2 sat currently stable.  Refractory multiple myeloma Recent chemotherapy. Has pancytopenia. Appreciate oncology follow-up. Continue to monitor..   Severe anemia and thrombocytopenia Transfusing with irradiated  platelets ( 1 u) and PRBC( 2 units). Monitor labs in a.m.  Epistaxis Secondary to early DIC. Currently improved. Will monitor.   DVT prophylaxis:SCD/ pt wishes to ambulate.  Diet: Regular  Code Status: full code Family Communication: none at bedside Disposition Plan: continue inpt  monitoring   Consultants:  Oncology  Procedures:  none  Antibiotics:  IV vanco and primaxin 8/12--  HPI/Subjective: Seen and examined . C/o pain in left chest. Epistaxis and shortness of breath  improved.   Objective: Filed Vitals:   03/14/15 0826  BP: 138/82  Pulse: 88  Temp: 98.2 F (36.8 C)  Resp: 18    Intake/Output Summary (Last 24 hours) at 03/14/15 1226 Last data filed at 03/14/15 7076  Gross per 24 hour  Intake    980 ml  Output      0 ml  Net    980 ml   Filed Weights   03/14/15 0203 03/14/15 0331 03/14/15 0511  Weight: 89.812 kg (198 lb) 81.647 kg (180 lb) 81.647 kg (180 lb)    Exam:   General:  Middle aged male appears fatigued, not in distress  ENT: Pallor present,no active epistaxis, moist mucosa  Cardiovascular: Normal S1 and S2, no murmurs  Respiratory: clear b/l, tender to pressure over left ribs  Abdomen: soft, NT ND, BS+  Musculoskeletal: warm, no edema   CNS: alert and oriented.  Data Reviewed: Basic Metabolic Panel:  Recent Labs Lab 03/08/15 0500 03/09/15 0550 03/10/15 1136 03/14/15 0239  NA 139 138 143 142  K 4.1 3.5 3.5 3.6  CL 112* 108 110* 110  CO2 '22 25 24 23  ' GLUCOSE 127* 120* 176* 146*  BUN 27* 26* 18 15  CREATININE 1.01 0.94 0.9 1.06  CALCIUM 9.0 8.6* 8.6 7.3*  PHOS 3.0 3.3  --   --    Liver Function Tests:  Recent Labs Lab 03/08/15 0500 03/09/15 0550 03/10/15 1136  AST  --   --  24  ALT  --   --  24  ALKPHOS  --   --  62  BILITOT  --   --  0.80  PROT  --   --  5.7*  ALBUMIN 3.5 3.2*  --  No results for input(s): LIPASE, AMYLASE in the last 168 hours. No results for input(s): AMMONIA in the last 168 hours. CBC:  Recent Labs Lab 03/08/15 0500 03/09/15 0550 03/10/15 1136 03/14/15 0239 03/14/15 0354  WBC 6.4 3.0* 2.7* 0.1*  --   NEUTROABS  --   --  2.6  --   --   HGB 9.3* 9.4* 9.6* 8.4*  --   HCT 26.9* 27.1* 27.0* 24.1*  --   MCV 91.5 90.6 92 90.9  --   PLT 39* 28* 41* 8* 11*    Cardiac Enzymes:  Recent Labs Lab 03/14/15 0242 03/14/15 1015  TROPONINI <0.03 <0.03   BNP (last 3 results) No results for input(s): BNP in the last 8760 hours.  ProBNP (last 3 results) No results for input(s): PROBNP in the last 8760 hours.  CBG:  Recent Labs Lab 03/08/15 2127 03/08/15 2242 03/09/15 0110 03/09/15 0736 03/09/15 1141  GLUCAP 193* 185* 132* 233* 149*    Recent Results (from the past 240 hour(s))  Blood Culture (routine x 2)     Status: None (Preliminary result)   Collection Time: 03/14/15  3:45 AM  Result Value Ref Range Status   Specimen Description   Final    BLOOD PORTA CATH Performed at [facility]    Special Requests BOTTLES DRAWN AEROBIC AND ANAEROBIC 4ML  Final   Culture PENDING  Incomplete   Report Status PENDING  Incomplete     Studies: Dg Chest 2 View  03/14/2015   CLINICAL DATA:  Shortness breath.  Chest pain.  Multiple myeloma.  EXAM: CHEST - 2 VIEW  COMPARISON:  Two-view chest x-ray 03/06/2015  FINDINGS: Heart size is normal. A right IJ Port-A-Cath is stable. The lung volumes are somewhat low. No focal airspace disease is present. There is no edema or effusion to suggest failure. The visualized soft tissues are within normal limits. Focal kyphosis in the lower thoracic spine is stable.  IMPRESSION: 1. No acute cardiopulmonary disease or significant interval change. 2. Persistent low lung volumes. 3. Stable right IJ Port-A-Cath.   Electronically Signed   By: San Morelle M.D.   On: 03/14/2015 02:46    Scheduled Meds: . sodium chloride   Intravenous Once  . antiseptic oral rinse  15 mL Mouth Rinse 6 X Daily  . famciclovir  500 mg Oral Daily  . fentaNYL  75 mcg Transdermal Q72H  . gabapentin  600 mg Oral TID  . imipenem-cilastatin  500 mg Intravenous Q6H  . montelukast  10 mg Oral QHS  . pantoprazole  40 mg Oral Daily  . saccharomyces boulardii  250 mg Oral BID  . sodium bicarbonate/sodium chloride   Mouth Rinse Q6H    Continuous Infusions:     Time spent: 25 minutes    Mason Dibiasio  [facility] Pager [phone] If 7PM-7AM, please contact night-coverage at [email or web address], password [facility] 03/14/2015, 12:26 PM  LOS: 0 days

## 2015-03-14 NOTE — ED Notes (Signed)
PROTECTIVE PRECAUTIONS initiated.

## 2015-03-14 NOTE — Consult Note (Signed)
Referral MD  Reason for Referral: Pancytopenia secondary to chemotherapy for light chain myeloma   Chief Complaint  Patient presents with  . Shortness of Breath  . Chest Pain  : My blood counts are low.  HPI: Mark [name] is well-known to me. He is a 44 year old African-American male. He has recurrent light chain myeloma. He received his first cycle of salvage chemotherapy about 10 days ago. He received VD-PACE. He tolerated this pretty well. I dose reduced him a little bit because of past chemotherapy and his stem cell transplant. He did receive Neulasta in the office. He got his Neulasta on August 2.  He now is admitted with pancytopenia. He has some chest wall pain. He has some shortness of breath. Chest x-ray showed no obvious infiltrate. This is not surprising as he has no white blood cells. His platelet count was quite low. He has already received a unit of platelets.  He has had some nosebleeds. He's not really coughing up any blood. Per. He said that he had a temperature at home. There were no chills.  At home, he was on Levaquin, Famvir, and Diflucan. These were all prophylactic medications for his pancytopenia.  He had no diarrhea.  He is having some more bone pain. This may be from the Neulasta that he received.  One of the reasons that we instituted chemotherapy on him a couple weeks ago was that he had refractory hypercalcemia. On admission, his calcium was 7.3.  He's had no rashes. He's had no obvious mouth sores.  We are asked to see him to help with management.                         Past Medical History  Diagnosis Date  . History of radiation therapy 02/07/13- 02/26/13    lower L spine, upper sacrum, 35 gray in 14 fractions  . FH: chemotherapy     Dr. Burney Gauze  . Cancer   . Multiple myeloma dx'd 01/2013  . GERD (gastroesophageal reflux disease)   :  Past Surgical History  Procedure Laterality Date  . Portacath placement    . Bone marrow  transplant  09/12/13    [facility]  . Kyphoplasty  05/20/14  . Lumbar laminectomy/decompression microdiscectomy N/A 09/25/2014    Procedure: LUMBAR LAMINECTOMY/DECOMPRESSION MICRODISCECTOMY;  Surgeon: Sinclair Ship, MD;  Location: [place];  Service: Orthopedics;  Laterality: N/A;  Lumbar 4-5, L5-S1  decompression  . Esophagogastroduodenoscopy N/A 02/06/2015    Procedure: ESOPHAGOGASTRODUODENOSCOPY (EGD);  Surgeon: Teena Irani, MD;  Location: [facility];  Service: Endoscopy;  Laterality: N/A;  :   Current facility-administered medications:  .  0.9 %  sodium chloride infusion, , Intravenous, Once, Jared M Gardner, DO .  acetaminophen (TYLENOL) tablet 1,000 mg, 1,000 mg, Oral, Q6H PRN, Etta Quill, DO .  antiseptic oral rinse (BIOTENE) solution 15 mL, 15 mL, Mouth Rinse, 6 X Daily, [name], MD .  bisacodyl (DULCOLAX) EC tablet 5 mg, 5 mg, Oral, Daily PRN, Etta Quill, DO .  famciclovir [facility]) tablet 500 mg, 500 mg, Oral, Daily, Etta Quill, DO, 500 mg at 03/14/15 0853 .  fentaNYL (DURAGESIC - dosed mcg/hr) patch 75 mcg, 75 mcg, Transdermal, Q72H, Etta Quill, DO, 75 mcg at 03/14/15 0556 .  gabapentin (NEURONTIN) capsule 600 mg, 600 mg, Oral, TID, Etta Quill, DO, 600 mg at 03/14/15 0854 .  HYDROmorphone (DILAUDID) tablet 4-8 mg, 4-8 mg, Oral, Q4H  PRN, Etta Quill, DO, 4 mg at 03/14/15 0544 .  imipenem-cilastatin (PRIMAXIN) 500 mg in sodium chloride 0.9 % 100 mL IVPB, 500 mg, Intravenous, Q6H, Leann T Poindexter, RPH, 500 mg at 03/14/15 0853 .  LORazepam (ATIVAN) tablet 0.5 mg, 0.5 mg, Oral, Q6H PRN, Etta Quill, DO .  metoCLOPramide (REGLAN) tablet 10 mg, 10 mg, Oral, Q6H PRN, Etta Quill, DO .  montelukast (SINGULAIR) tablet 10 mg, 10 mg, Oral, QHS, Jared M Gardner, DO .  ondansetron (ZOFRAN-ODT) disintegrating tablet 8 mg, 8 mg, Oral, Q8H PRN, Etta Quill, DO .  pantoprazole (PROTONIX) EC tablet 40 mg, 40 mg, Oral, Daily, Etta Quill,  DO, 40 mg at 03/14/15 0853 .  prochlorperazine (COMPAZINE) tablet 10 mg, 10 mg, Oral, Q6H PRN, Etta Quill, DO .  saccharomyces boulardii (FLORASTOR) capsule 250 mg, 250 mg, Oral, BID, Etta Quill, DO, 250 mg at 03/14/15 0853 .  sodium bicarbonate/sodium chloride mouthwash 1037m, , Mouth Rinse, Q6H, PVolanda Napoleon MD .  temazepam (RESTORIL) capsule 22.5 mg, 22.5 mg, Oral, QHS PRN, JEtta Quill DO:  . sodium chloride   Intravenous Once  . antiseptic oral rinse  15 mL Mouth Rinse 6 X Daily  . famciclovir  500 mg Oral Daily  . fentaNYL  75 mcg Transdermal Q72H  . gabapentin  600 mg Oral TID  . imipenem-cilastatin  500 mg Intravenous Q6H  . montelukast  10 mg Oral QHS  . pantoprazole  40 mg Oral Daily  . saccharomyces boulardii  250 mg Oral BID  . sodium bicarbonate/sodium chloride   Mouth Rinse Q6H  :  Allergies  Allergen Reactions  . Gadolinium Derivatives Nausea And Vomiting    Pt became very nauseous and got sick.   :  Family History  Problem Relation Age of Onset  . Diabetic kidney disease Mother   . Hypertension Mother   . Heart attack Mother   . Kidney failure Mother   . Coronary artery disease Mother   . HIV Father   :  Social History   Social History  . Marital Status: Married    Spouse Name: N/A  . Number of Children: N/A  . Years of Education: N/A   Occupational History  . Not on file.   Social History Main Topics  . Smoking status: Former Smoker -- 1.00 packs/day for 15 years    Types: Cigarettes    Start date: 06/29/1988    Quit date: 06/29/2003  . Smokeless tobacco: Never Used     Comment: quit smoking 10 years ago  . Alcohol Use: No  . Drug Use: No  . Sexual Activity: No   Other Topics Concern  . Not on file   Social History Narrative  :  Pertinent items are noted in HPI.  Exam: Patient Vitals for the past 24 hrs:  BP Temp Temp src Pulse Resp SpO2 Height Weight  03/14/15 0826 138/82 mmHg 98.2 F (36.8 C) Oral 88 18 100 % -  -  03/14/15 0700 (!) 149/101 mmHg 98.2 F (36.8 C) Oral 79 16 100 % - -  03/14/15 0633 131/79 mmHg 98.4 F (36.9 C) Oral 95 16 100 % - -  03/14/15 0511 (!) 144/84 mmHg 98.5 F (36.9 C) Oral 90 16 100 % _0  (1.803 m) 180 lb (81.647 kg)  03/14/15 0430 150/94 mmHg - - 90 16 100 % - -  03/14/15 0415 133/84 mmHg - - 93 17 100 % - -  03/14/15 0400 139/79 mmHg - - 93 20 100 % - -  03/14/15 0345 144/96 mmHg - - 88 13 99 % - -  03/14/15 0331 - - - - - - _0  (1.803 m) 180 lb (81.647 kg)  03/14/15 0203 145/82 mmHg 99.4 F (37.4 C) Oral 111 22 99 % _1  (1.803 m) 198 lb (89.812 kg)    well-developed and well-nourished African-American male. Head and neck exam shows no ocular or oral lesions. He has no mucositis. There is no adenopathy in the neck. Lungs are clear. Cardiac exam regular rate and rhythm with no murmurs, rubs or bruits. Abdomen is soft. He has good bowel sounds. There is no fluid wave. There is no palpable liver or spleen tip. Skin exam shows no rashes, ecchymoses or petechia. Extremities shows no clubbing, cyanosis or edema. He has good range of motion of his joints. Neurological exam shows no focal neurological deficits.    Recent Labs  03/14/15 0239 03/14/15 0354  WBC 0.1*  --   HGB 8.4*  --   HCT 24.1*  --   PLT 8* 11*    Recent Labs  03/14/15 0239  NA 142  K 3.6  CL 110  CO2 23  GLUCOSE 146*  BUN 15  CREATININE 1.06  CALCIUM 7.3*    Blood smear review:  None   Pathology: None     Assessment and Plan:  Mark [name] is a 44 year old African-American male with recurrent light chain myeloma. He had a stem cell transplant down at [facility] back in February 2015.  I have to believe that the salvage chemotherapy, so far, is working. Unfortunately, he does have a sensitive bone marrow and his pancytopenia is not too much of a surprise.  I totally agree with the antibiotics that he is on. We will have to see what his culture results show.  If he requires blood  product support, I would make sure his blood products are irradiated.  He is on a monitor. He is in normal sinus rhythm from the monitor.  He feels very comfortable being on the third floor. 3 [address]. no some very well. I think that if everything is stable today, then he probably can be moved to [place] tomorrow.  I appreciate the outstanding care that he is receiving up on 4 [address]. You all are doing a great job with him.  Lum Keas  Psalm 103:11

## 2015-03-15 LAB — COMPREHENSIVE METABOLIC PANEL WITH GFR
ALT: 23 U/L (ref 17–63)
AST: 18 U/L (ref 15–41)
Albumin: 3.1 g/dL — ABNORMAL LOW (ref 3.5–5.0)
Alkaline Phosphatase: 96 U/L (ref 38–126)
Anion gap: 6 (ref 5–15)
BUN: 11 mg/dL (ref 6–20)
CO2: 27 mmol/L (ref 22–32)
Calcium: 6.5 mg/dL — ABNORMAL LOW (ref 8.9–10.3)
Chloride: 110 mmol/L (ref 101–111)
Creatinine, Ser: 0.71 mg/dL (ref 0.61–1.24)
GFR calc Af Amer: >60 mL/min
GFR calc non Af Amer: >60 mL/min
Glucose, Bld: 100 mg/dL — ABNORMAL HIGH (ref 65–99)
Potassium: 3.2 mmol/L — ABNORMAL LOW (ref 3.5–5.1)
Sodium: 143 mmol/L (ref 135–145)
Total Bilirubin: 0.9 mg/dL (ref 0.3–1.2)
Total Protein: 6 g/dL — ABNORMAL LOW (ref 6.5–8.1)

## 2015-03-15 LAB — DIC (DISSEMINATED INTRAVASCULAR COAGULATION)PANEL
D-Dimer, Quant: 0.72 ug{FEU}/mL — ABNORMAL HIGH (ref 0.00–0.48)
Fibrinogen: 590 mg/dL — ABNORMAL HIGH (ref 204–475)
INR: 0.94 (ref 0.00–1.49)
Platelets: 30 K/uL — ABNORMAL LOW (ref 150–400)
Prothrombin Time: 12.8 s (ref 11.6–15.2)
Smear Review: NONE SEEN
aPTT: 26 s (ref 24–37)

## 2015-03-15 LAB — TYPE AND SCREEN
ABO/RH(D): B POS
Antibody Screen: POSITIVE
DAT, IgG: NEGATIVE
Unit division: 0
Unit division: 0

## 2015-03-15 LAB — CBC WITH DIFFERENTIAL/PLATELET
HCT: 27 % — ABNORMAL LOW (ref 39.0–52.0)
Hemoglobin: 9.6 g/dL — ABNORMAL LOW (ref 13.0–17.0)
MCH: 31.5 pg (ref 26.0–34.0)
MCHC: 35.6 g/dL (ref 30.0–36.0)
MCV: 88.5 fL (ref 78.0–100.0)
Platelets: 30 K/uL — ABNORMAL LOW (ref 150–400)
RBC: 3.05 MIL/uL — ABNORMAL LOW (ref 4.22–5.81)
RDW: 14.4 % (ref 11.5–15.5)
WBC: 0.1 K/uL — CL (ref 4.0–10.5)

## 2015-03-15 LAB — URINE CULTURE: Culture: NO GROWTH

## 2015-03-15 MED ORDER — FENTANYL CITRATE (PF) 100 MCG/2ML IJ SOLN
25.0000 ug | Freq: Once | INTRAMUSCULAR | Status: DC
  Filled 2015-03-15: qty 2

## 2015-03-15 MED ORDER — SODIUM CHLORIDE 0.9 % IV SOLN
INTRAVENOUS | Status: AC
  Administered 2015-03-16: 02:00:00 via INTRAVENOUS

## 2015-03-15 MED ORDER — POTASSIUM CHLORIDE CRYS ER 20 MEQ PO TBCR
40.0000 meq | EXTENDED_RELEASE_TABLET | Freq: Once | ORAL | Status: AC
  Administered 2015-03-15: 40 meq via ORAL
  Filled 2015-03-15: qty 2

## 2015-03-15 MED ORDER — KETOROLAC TROMETHAMINE 30 MG/ML IJ SOLN
30.0000 mg | Freq: Once | INTRAMUSCULAR | Status: DC
  Filled 2015-03-15: qty 1

## 2015-03-15 NOTE — Progress Notes (Signed)
Report received from [place] and agree with previous assessment at this time. No changes noted and maintain current plan of care

## 2015-03-15 NOTE — Progress Notes (Addendum)
[facility] PROGRESS NOTE  Mark [name] KPT:[id number] DOB: [birth year] DOA: 03/14/2015 PCP: No PCP Per Patient  brief narrative 44 year old male with relapsed multiple myeloma undergoing chemotherapy recent hospitalization for healthcare associated pneumonia with hemoptysis and underwent salvage chemotherapy for refractory hypercalcemia, (last dose on 8/8, received Neulasta on 8/2) was discharged home in stable condition but returned back with epistaxis and laboratory blood in stool associated with shortness of breath patient also has been complaining of left-sided chest pain for almost a month. Found to have a low-grade fever and pancytopenia. He was admitted on telemetry and ordered for PRBC and platelet transfusion. Oncology following.   Assessment/Plan: Pancytopenia Continue neutropenic precautions. On empiric antibiotics (vancomycin and Primaxin).Continue famciclovir.  Early DIC - elevated APTT, fibrinogen and d-dimer. APTT improved this morning, fibrinogen and d-dimer improving as well. Received FFP and PRBC on a cast 13. No further epistaxis. Continue to monitor.  Left-sided chest wall pain with shortness of breath Chest x-ray unremarkable.  CT angiogram of the chest one month back for similar symptoms and was negative for PE. Pain is musculoskeletal. Continue with fentanyl patch, ordered Lidoderm patch and  increased frequency of diluadid. Somewhat better today.  Refractory multiple myeloma Recent chemotherapy. Has pancytopenia. Appreciate oncology follow-up. Continue to monitor..   Severe anemia and thrombocytopenia Transfused with irradiated  platelets ( 1 u) and PRBC( 2 units) on 8/13. Hemoglobin and platelets improved in a.m. labs.  Epistaxis Secondary to early DIC. Resolved at present. Continue to monitor.  Headache and dizziness Order IV fluids. If persistent, will obtain head CT  Hypokalemia Replenished  DVT prophylaxis:SCD/ pt wishes to ambulate.  Diet:  Regular  Code Status: full code Family Communication: none at bedside Disposition Plan: continue inpt monitoring   Consultants:  Oncology  Procedures:  none  Antibiotics:  IV vanco and primaxin 8/12--  HPI/Subjective: Seen and examined . Chest pain slightly better. Complains of headache and dizziness.  Objective: Filed Vitals:   03/15/15 0525  BP: 126/81  Pulse: 77  Temp: 98 F (36.7 C)  Resp: 20    Intake/Output Summary (Last 24 hours) at 03/15/15 1155 Last data filed at 03/15/15 0948  Gross per 24 hour  Intake   1200 ml  Output      0 ml  Net   1200 ml   Filed Weights   03/14/15 0203 03/14/15 0331 03/14/15 0511  Weight: 89.812 kg (198 lb) 81.647 kg (180 lb) 81.647 kg (180 lb)    Exam:   General:  , not in distress  ENT: ,no  epistaxis, moist mucosa  Cardiovascular: Normal S1 and S2, no murmurs  Respiratory: clear b/l, tender to pressure over left ribs  Abdomen: soft, NT ND, BS+  Musculoskeletal: warm, no edema   CNS: alert and oriented.  Data Reviewed: Basic Metabolic Panel:  Recent Labs Lab 03/09/15 0550 03/10/15 1136 03/14/15 0239 03/15/15 0430  NA 138 143 142 143  K 3.5 3.5 3.6 3.2*  CL 108 110* 110 110  CO2 _0 GLUCOSE 120* 176* 146* 100*  BUN 26* _1 CREATININE 0.94 0.9 1.06 0.71  CALCIUM 8.6* 8.6 7.3* 6.5*  PHOS 3.3  --   --   --    Liver Function Tests:  Recent Labs Lab 03/09/15 0550 03/10/15 1136 03/15/15 0430  AST  --  24 18  ALT  --  24 23  ALKPHOS  --  62 96  BILITOT  --  0.80 0.9  PROT  --  5.7* 6.0*  ALBUMIN 3.2*  --  3.1*   No results for input(s): LIPASE, AMYLASE in the last 168 hours. No results for input(s): AMMONIA in the last 168 hours. CBC:  Recent Labs Lab 03/09/15 0550 03/10/15 1136 03/14/15 0239 03/14/15 0354 03/14/15 2030 03/15/15 0430  WBC 3.0* 2.7* 0.1*  --  0.1* 0.1*  NEUTROABS  --  2.6  --   --   --   --   HGB 9.4* 9.6* 8.4*  --  8.8* 9.6*  HCT 27.1* 27.0* 24.1*   --  25.5* 27.0*  MCV 90.6 92 90.9  --  88.2 88.5  PLT 28* 41* 8* 11* 30* 30*  30*   Cardiac Enzymes:  Recent Labs Lab 03/14/15 0242 03/14/15 1015 03/14/15 1559  TROPONINI <0.03 <0.03 <0.03   BNP (last 3 results) No results for input(s): BNP in the last 8760 hours.  ProBNP (last 3 results) No results for input(s): PROBNP in the last 8760 hours.  CBG:  Recent Labs Lab 03/08/15 2127 03/08/15 2242 03/09/15 0110 03/09/15 0736 03/09/15 1141  GLUCAP 193* 185* 132* 233* 149*    Recent Results (from the past 240 hour(s))  Blood Culture (routine x 2)     Status: None (Preliminary result)   Collection Time: 03/14/15  3:45 AM  Result Value Ref Range Status   Specimen Description   Final    BLOOD PORTA CATH Performed at [facility]    Special Requests BOTTLES DRAWN AEROBIC AND ANAEROBIC 4ML  Final   Culture PENDING  Incomplete   Report Status PENDING  Incomplete     Studies: Dg Chest 2 View  03/14/2015   CLINICAL DATA:  Shortness breath.  Chest pain.  Multiple myeloma.  EXAM: CHEST - 2 VIEW  COMPARISON:  Two-view chest x-ray 03/06/2015  FINDINGS: Heart size is normal. A right IJ Port-A-Cath is stable. The lung volumes are somewhat low. No focal airspace disease is present. There is no edema or effusion to suggest failure. The visualized soft tissues are within normal limits. Focal kyphosis in the lower thoracic spine is stable.  IMPRESSION: 1. No acute cardiopulmonary disease or significant interval change. 2. Persistent low lung volumes. 3. Stable right IJ Port-A-Cath.   Electronically Signed   By: San Morelle M.D.   On: 03/14/2015 02:46    Scheduled Meds: . sodium chloride   Intravenous Once  . antiseptic oral rinse  15 mL Mouth Rinse 6 X Daily  . famciclovir  500 mg Oral Daily  . fentaNYL  75 mcg Transdermal Q72H  . gabapentin  600 mg Oral TID  . imipenem-cilastatin  500 mg Intravenous Q6H  . lidocaine  1 patch Transdermal Q24H  . montelukast  10 mg  Oral QHS  . pantoprazole  40 mg Oral Daily  . saccharomyces boulardii  250 mg Oral BID  . sodium bicarbonate/sodium chloride   Mouth Rinse Q6H   Continuous Infusions: . sodium chloride        Time spent: 25 minutes    Larin Depaoli  [facility] Pager [phone] If 7PM-7AM, please contact night-coverage at [email or web address], password [facility] 03/15/2015, 11:55 AM  LOS: 1 day

## 2015-03-16 ENCOUNTER — Ambulatory Visit: Payer: BLUE CROSS/BLUE SHIELD | Admitting: Hematology & Oncology

## 2015-03-16 ENCOUNTER — Other Ambulatory Visit: Payer: BLUE CROSS/BLUE SHIELD

## 2015-03-16 ENCOUNTER — Ambulatory Visit: Payer: BLUE CROSS/BLUE SHIELD

## 2015-03-16 LAB — PREPARE PLATELET PHERESIS
UNIT DIVISION: 0
UNIT DIVISION: 0

## 2015-03-16 LAB — CBC WITH DIFFERENTIAL/PLATELET
BASOS ABS: 0 10*3/uL (ref 0.0–0.1)
Band Neutrophils: 0 % (ref 0–10)
Basophils Relative: 0 % (ref 0–1)
EOS ABS: 0 10*3/uL (ref 0.0–0.7)
Eosinophils Relative: 0 % (ref 0–5)
HCT: 25.2 % — ABNORMAL LOW (ref 39.0–52.0)
Hemoglobin: 8.7 g/dL — ABNORMAL LOW (ref 13.0–17.0)
LYMPHS ABS: 0 10*3/uL — AB (ref 0.7–4.0)
Lymphocytes Relative: 0 % — ABNORMAL LOW (ref 12–46)
MCH: 30.1 pg (ref 26.0–34.0)
MCHC: 34.5 g/dL (ref 30.0–36.0)
MCV: 87.2 fL (ref 78.0–100.0)
MONO ABS: 0 10*3/uL — AB (ref 0.1–1.0)
Monocytes Relative: 0 % — ABNORMAL LOW (ref 3–12)
NEUTROS PCT: 0 % — AB (ref 43–77)
PLATELETS: 16 10*3/uL — AB (ref 150–400)
RBC: 2.89 MIL/uL — AB (ref 4.22–5.81)
RDW: 14.2 % (ref 11.5–15.5)
WBC: 0.1 10*3/uL — AB (ref 4.0–10.5)
nRBC: 0 /100 WBC

## 2015-03-16 LAB — DIC (DISSEMINATED INTRAVASCULAR COAGULATION) PANEL
D DIMER QUANT: 0.66 ug{FEU}/mL — AB (ref 0.00–0.48)
FIBRINOGEN: 594 mg/dL — AB (ref 204–475)
INR: 1.01 (ref 0.00–1.49)
PROTHROMBIN TIME: 13.5 s (ref 11.6–15.2)

## 2015-03-16 LAB — COMPREHENSIVE METABOLIC PANEL
ALBUMIN: 2.9 g/dL — AB (ref 3.5–5.0)
ALT: 20 U/L (ref 17–63)
AST: 17 U/L (ref 15–41)
Alkaline Phosphatase: 100 U/L (ref 38–126)
Anion gap: 6 (ref 5–15)
BUN: 13 mg/dL (ref 6–20)
CHLORIDE: 109 mmol/L (ref 101–111)
CO2: 27 mmol/L (ref 22–32)
Calcium: 6.4 mg/dL — CL (ref 8.9–10.3)
Creatinine, Ser: 0.8 mg/dL (ref 0.61–1.24)
GFR calc Af Amer: >60 mL/min (ref >60)
Glucose, Bld: 104 mg/dL — ABNORMAL HIGH (ref 65–99)
POTASSIUM: 3.5 mmol/L (ref 3.5–5.1)
SODIUM: 142 mmol/L (ref 135–145)
Total Bilirubin: 0.8 mg/dL (ref 0.3–1.2)
Total Protein: 5.6 g/dL — ABNORMAL LOW (ref 6.5–8.1)

## 2015-03-16 LAB — DIC (DISSEMINATED INTRAVASCULAR COAGULATION)PANEL
Platelets: 16 10*3/uL — CL (ref 150–400)
Smear Review: NONE SEEN
aPTT: 28 seconds (ref 24–37)

## 2015-03-16 MED ORDER — SODIUM CHLORIDE 0.9 % IV SOLN
1.0000 g | Freq: Once | INTRAVENOUS | Status: AC
  Administered 2015-03-16: 1 g via INTRAVENOUS
  Filled 2015-03-16: qty 10

## 2015-03-16 NOTE — Care Management Note (Signed)
Case Management Note  Patient Details  Name: Mark [name] MRN: [id number] Date of Birth: May 26, 1971  Subjective/Objective: 44 y/o m admitted w/neutropenia. From home.                   Action/Plan:d/c plan home.                 Expected Discharge Plan:  Home/Self Care  In-House Referral:     Discharge planning Services  CM Consult  Post Acute Care Choice:    Choice offered to:     DME Arranged:    DME Agency:     HH Arranged:    HH Agency:     Status of Service:  In process, will continue to follow  Medicare Important Message Given:    Date Medicare IM Given:    Medicare IM give by:    Date Additional Medicare IM Given:    Additional Medicare Important Message give by:     If discussed at [place] of Stay Meetings, dates discussed:    Additional Comments:  Dessa Phi, RN 03/16/2015, 3:22 PM

## 2015-03-16 NOTE — Progress Notes (Signed)
Mr. Hockett is looking a little better.  WBC is at its nadir.  He got Neulasta in the office.  So far cultures are negative to date. He still is on empiric antibiotics.  He has had a couple small nosebleeds. I will hold off on platelet transfusion. His platelet count is 16,000. His hemoglobin is doing okay.  His calcium is actually on the lower side. I'll just watch this for right now.  He's had no mouth sores.  His breathing is doing okay. He's had no shortness of breath.  He's had no nausea or vomiting.  There is no diarrhea.  [name], his vital signs are all stable. Blood pressure 133/88. His temperature is 98.7. Head and neck exam shows no ocular or oral lesions. He has no mucositis. There is no adenopathy in the neck. Lungs are clear bilaterally. Cardiac exam regular in rhythm. Abdomen is soft. Bowel sounds present. Extremities shows no clubbing, cyanosis or edema.  His potassium is 3.5. His creatinine is 0.8. Albumin is 2.9.  I would still continue to follow Mr. Asbridge as an inpatient.  I'll try to move him down to 3 W. I don't think needs to be on a monitored floor at this point.  I appreciate all the great care than he has received upon 4 [address].  Pete E.  1 thessalonians 5:16-18

## 2015-03-16 NOTE — Progress Notes (Signed)
[facility] PROGRESS NOTE  Mark [name] EKB:[id number] DOB: 02-13-1971 DOA: 03/14/2015 PCP: No PCP Per Patient  brief narrative 44 year old male with relapsed multiple myeloma undergoing chemotherapy recent hospitalization for healthcare associated pneumonia with hemoptysis and underwent salvage chemotherapy for refractory hypercalcemia, (last dose on 8/8, received Neulasta on 8/2) was discharged home in stable condition but returned back with epistaxis and laboratory blood in stool associated with shortness of breath patient also has been complaining of left-sided chest pain for almost a month. Found to have a low-grade fever and pancytopenia. He was admitted on telemetry and ordered for PRBC and platelet transfusion. Oncology following.   Assessment/Plan: Pancytopenia Continue neutropenic precautions. On empiric antibiotics (vancomycin and Primaxin). Continue famciclovir.   DIC  fibrinogen and d-dimer elevate and reman stable since yesterday. . APTT now normal. Received FFP and PRBC on 8/13. Continue to monitor.  Left-sided chest wall pain with shortness of breath Chest x-ray unremarkable.  CT angiogram of the chest one month back for similar symptoms and was negative for PE. Pain is musculoskeletal. Continue with fentanyl patch and Lidoderm patch . Pain has Improved after increasing frequency of diluadid.   Refractory multiple myeloma Recent chemotherapy. Has pancytopenia. Appreciate oncology follow-up. Continue to monitor.  Severe anemia and thrombocytopenia Transfused with irradiated  platelets ( 1 u) and PRBC( 2 units) on 8/13. Platelets dropped again today. Hold off  transfusion at this time.  Epistaxis Secondary to early DIC. Mild this am. monitor  Headache and dizziness Improved with IV fluids  Hypokalemia Replenished  DVT prophylaxis:SCD/ pt wishes to ambulate.  Diet: Regular  Code Status: full code Family Communication: none at bedside Disposition Plan:  continue inpt monitoring   Consultants:  Oncology  Procedures:  none  Antibiotics:  IV vanco and primaxin 8/12--  HPI/Subjective: Seen and examined . Chest pain  better. Had mild epistaxis this am. No headache and dizziness.  Objective: Filed Vitals:   03/16/15 0540  BP: 133/88  Pulse: 81  Temp: 98.7 F (37.1 C)  Resp: 20    Intake/Output Summary (Last 24 hours) at 03/16/15 1314 Last data filed at 03/16/15 0857  Gross per 24 hour  Intake   2705 ml  Output      0 ml  Net   2705 ml   Filed Weights   03/14/15 0203 03/14/15 0331 03/14/15 0511  Weight: 89.812 kg (198 lb) 81.647 kg (180 lb) 81.647 kg (180 lb)    Exam:   General:  , not in distress  ENT: , moist mucosa  Cardiovascular: Normal S1 and S2, no murmurs  Respiratory: clear b/l,  Abdomen: soft, NT ND, BS+  Musculoskeletal: warm, no edema   CNS: alert and oriented.  Data Reviewed: Basic Metabolic Panel:  Recent Labs Lab 03/10/15 1136 03/14/15 0239 03/15/15 0430 03/16/15 0500  NA 143 142 143 142  K 3.5 3.6 3.2* 3.5  CL 110* 110 110 109  CO2 _0 GLUCOSE 176* 146* 100* 104*  BUN _1 CREATININE 0.9 1.06 0.71 0.80  CALCIUM 8.6 7.3* 6.5* 6.4*   Liver Function Tests:  Recent Labs Lab 03/10/15 1136 03/15/15 0430 03/16/15 0500  AST _2 ALT _3 ALKPHOS 62 96 100  BILITOT 0.80 0.9 0.8  PROT 5.7* 6.0* 5.6*  ALBUMIN  --  3.1* 2.9*   No results for input(s): LIPASE, AMYLASE in the last 168 hours. No results for input(s): AMMONIA in the last 168 hours. CBC:  Recent Labs Lab 03/10/15 1136 03/14/15 0239 03/14/15 0354 03/14/15 2030 03/15/15 0430 03/16/15 0500  WBC 2.7* 0.1*  --  0.1* 0.1* 0.1*  NEUTROABS 2.6  --   --   --   --   --   HGB 9.6* 8.4*  --  8.8* 9.6* 8.7*  HCT 27.0* 24.1*  --  25.5* 27.0* 25.2*  MCV 92 90.9  --  88.2 88.5 87.2  PLT 41* 8* 11* 30* 30*  30* 16*  16*   Cardiac Enzymes:  Recent Labs Lab 03/14/15 0242 03/14/15 1015  03/14/15 1559  TROPONINI <0.03 <0.03 <0.03   BNP (last 3 results) No results for input(s): BNP in the last 8760 hours.  ProBNP (last 3 results) No results for input(s): PROBNP in the last 8760 hours.  CBG: No results for input(s): GLUCAP in the last 168 hours.  Recent Results (from the past 240 hour(s))  Blood Culture (routine x 2)     Status: None (Preliminary result)   Collection Time: 03/14/15  3:40 AM  Result Value Ref Range Status   Specimen Description BLOOD RIGHT ANTECUBITAL  Final   Special Requests BOTTLES DRAWN AEROBIC AND ANAEROBIC 5ML  Final   Culture   Final    NO GROWTH 1 DAY Performed at [facility]    Report Status PENDING  Incomplete  Blood Culture (routine x 2)     Status: None (Preliminary result)   Collection Time: 03/14/15  3:45 AM  Result Value Ref Range Status   Specimen Description BLOOD PORTA CATH  Final   Special Requests BOTTLES DRAWN AEROBIC AND ANAEROBIC 4ML  Final   Culture   Final    NO GROWTH 1 DAY Performed at [facility]    Report Status PENDING  Incomplete  Urine culture     Status: None   Collection Time: 03/14/15  3:50 AM  Result Value Ref Range Status   Specimen Description URINE, CLEAN CATCH  Final   Special Requests NONE  Final   Culture   Final    NO GROWTH 1 DAY Performed at [facility]    Report Status 03/15/2015 FINAL  Final     Studies: No results found.  Scheduled Meds: . sodium chloride   Intravenous Once  . antiseptic oral rinse  15 mL Mouth Rinse 6 X Daily  . famciclovir  500 mg Oral Daily  . fentaNYL  75 mcg Transdermal Q72H  . fentaNYL (SUBLIMAZE) injection  25 mcg Intravenous Once  . gabapentin  600 mg Oral TID  . imipenem-cilastatin  500 mg Intravenous Q6H  . ketorolac  30 mg Intravenous Once  . lidocaine  1 patch Transdermal Q24H  . montelukast  10 mg Oral QHS  . pantoprazole  40 mg Oral Daily  . saccharomyces boulardii  250 mg Oral BID  . sodium bicarbonate/sodium chloride    Mouth Rinse Q6H   Continuous Infusions:      Time spent: 25 minutes    Mark [name]  [facility] Pager [phone] If 7PM-7AM, please contact night-coverage at [email or web address], password [facility] 03/16/2015, 1:14 PM  LOS: 2 days

## 2015-03-16 NOTE — Progress Notes (Signed)
CRITICAL VALUE ALERT  Critical value received:  Ca 6.4   Date of notification:  03/16/15  Time of notification:  4276  Critical value read back:Yes.    Nurse who received alert:  Philemon Kingdom D   MD notified (1st page):  NP Schorr  Time of first page:  [phone]  MD notified (2nd page):  Time of second page:  Responding MD:    Time MD responded:

## 2015-03-17 LAB — COMPREHENSIVE METABOLIC PANEL
ALT: 18 U/L (ref 17–63)
ANION GAP: 7 (ref 5–15)
AST: 16 U/L (ref 15–41)
Albumin: 3 g/dL — ABNORMAL LOW (ref 3.5–5.0)
Alkaline Phosphatase: 114 U/L (ref 38–126)
BUN: 14 mg/dL (ref 6–20)
CHLORIDE: 111 mmol/L (ref 101–111)
CO2: 25 mmol/L (ref 22–32)
Calcium: 6.9 mg/dL — ABNORMAL LOW (ref 8.9–10.3)
Creatinine, Ser: 0.91 mg/dL (ref 0.61–1.24)
GFR calc non Af Amer: >60 mL/min (ref >60)
Glucose, Bld: 125 mg/dL — ABNORMAL HIGH (ref 65–99)
Potassium: 3.8 mmol/L (ref 3.5–5.1)
SODIUM: 143 mmol/L (ref 135–145)
Total Bilirubin: 0.7 mg/dL (ref 0.3–1.2)
Total Protein: 5.6 g/dL — ABNORMAL LOW (ref 6.5–8.1)

## 2015-03-17 LAB — CBC WITH DIFFERENTIAL/PLATELET
Basophils Absolute: 0 10*3/uL (ref 0.0–0.1)
Basophils Relative: 0 % (ref 0–1)
EOS ABS: 0 10*3/uL (ref 0.0–0.7)
EOS PCT: 0 % (ref 0–5)
HCT: 25 % — ABNORMAL LOW (ref 39.0–52.0)
Hemoglobin: 8.8 g/dL — ABNORMAL LOW (ref 13.0–17.0)
LYMPHS ABS: 0.1 10*3/uL — AB (ref 0.7–4.0)
Lymphocytes Relative: 73 % — ABNORMAL HIGH (ref 12–46)
MCH: 31.1 pg (ref 26.0–34.0)
MCHC: 35.2 g/dL (ref 30.0–36.0)
MCV: 88.3 fL (ref 78.0–100.0)
MONOS PCT: 18 % — AB (ref 3–12)
Monocytes Absolute: 0 10*3/uL — ABNORMAL LOW (ref 0.1–1.0)
Neutro Abs: 0 10*3/uL — ABNORMAL LOW (ref 1.7–7.7)
Neutrophils Relative %: 9 % — ABNORMAL LOW (ref 43–77)
PLATELETS: 12 10*3/uL — AB (ref 150–400)
RBC: 2.83 MIL/uL — ABNORMAL LOW (ref 4.22–5.81)
RDW: 14 % (ref 11.5–15.5)
WBC: 0.1 10*3/uL — AB (ref 4.0–10.5)

## 2015-03-17 MED ORDER — DIPHENHYDRAMINE HCL 25 MG PO CAPS
25.0000 mg | ORAL_CAPSULE | Freq: Once | ORAL | Status: AC
  Administered 2015-03-17: 25 mg via ORAL
  Filled 2015-03-17: qty 1

## 2015-03-17 MED ORDER — SODIUM CHLORIDE 0.9 % IV SOLN
Freq: Once | INTRAVENOUS | Status: AC
  Administered 2015-03-17: 13:00:00 via INTRAVENOUS

## 2015-03-17 MED ORDER — SODIUM CHLORIDE 0.9 % IJ SOLN
10.0000 mL | INTRAMUSCULAR | Status: DC | PRN
  Administered 2015-03-17 (×2): 10 mL
  Filled 2015-03-17: qty 40

## 2015-03-17 MED ORDER — OXYMETAZOLINE HCL 0.05 % NA SOLN
2.0000 | NASAL | Status: DC | PRN
  Administered 2015-03-17: 2 via NASAL
  Filled 2015-03-17: qty 15

## 2015-03-17 NOTE — Progress Notes (Signed)
Transfer to Rm [id number], report given to Glori Luis

## 2015-03-17 NOTE — Progress Notes (Addendum)
[facility] PROGRESS NOTE  Mark [name] YEB:[id number] DOB: 1970-12-29 DOA: 03/14/2015 PCP: No PCP Per Patient  brief narrative 44 year old male with relapsed multiple myeloma undergoing chemotherapy recent hospitalization for healthcare associated pneumonia with hemoptysis and underwent salvage chemotherapy for refractory hypercalcemia, (last dose on 8/8, received Neulasta on 8/2) was discharged home in stable condition but returned back with epistaxis and laboratory blood in stool associated with shortness of breath patient also has been complaining of left-sided chest pain for almost a month. Found to have a low-grade fever and pancytopenia. He was admitted on telemetry and ordered for PRBC and platelet transfusion. Oncology following.   Assessment/Plan: Pancytopenia Continue neutropenic precautions. On empiric  Primaxin since admission. Blood cx negative. . Continue famciclovir.   DIC  fibrinogen and d-dimer elevate and remain stable. Has off and on epistaxis. . APTT now normal. Received FFP and PRBC on 8/13. Continue to monitor.  Left-sided chest wall pain with shortness of breath Chest x-ray unremarkable.  CT angiogram of the chest one month back for similar symptoms and was negative for PE. Pain is musculoskeletal. Continue with fentanyl patch and Lidoderm patch . Pain has Improved after increasing frequency of diluadid.   Refractory multiple myeloma Recent chemotherapy. Has pancytopenia. Appreciate oncology follow-up. Urine IFE and light chain ordered. ordered. Continue to monitor.  Severe anemia and thrombocytopenia Transfused with irradiated  platelets ( 1 u) and PRBC( 2 units) on 8/13. Platelets dropped again today. Ordered 1 u platelet.  Epistaxis Secondary to early DIC. Off and on. Hb stable.   Headache and dizziness Improved with IV fluids  Hypokalemia Replenished  DVT prophylaxis:SCD/  ambulate.  Diet: Regular  Code Status: full code Family Communication:  none at bedside Disposition Plan: continue inpt monitoring. Home once pancytopenia improves.  Consultants:  Oncology  Procedures:  none  Antibiotics:  IV vanco and primaxin 8/12--  HPI/Subjective: Seen and examined . Reports epistaxis last night.  symptoms better overall.  Objective: Filed Vitals:   03/17/15 1006  BP: 150/90  Pulse: 92  Temp: 98.6 F (37 C)  Resp: 18    Intake/Output Summary (Last 24 hours) at 03/17/15 1214 Last data filed at 03/17/15 0700  Gross per 24 hour  Intake   2300 ml  Output      0 ml  Net   2300 ml   Filed Weights   03/14/15 0331 03/14/15 0511 03/17/15 1100  Weight: 81.647 kg (180 lb) 81.647 kg (180 lb) 82.101 kg (181 lb)    Exam:   General: middle aged male not in distress  ENT: pallor+, moist mucosa  Cardiovascular: Normal S1 and S2, no murmurs, no left chest tenderness  Respiratory: clear b/l,  Abdomen: soft, NT ND, BS+  Musculoskeletal: warm, no edema   CNS: alert and oriented.  Data Reviewed: Basic Metabolic Panel:  Recent Labs Lab 03/14/15 0239 03/15/15 0430 03/16/15 0500 03/17/15 0145  NA 142 143 142 143  K 3.6 3.2* 3.5 3.8  CL 110 110 109 111  CO2 '23 27 27 25  ' GLUCOSE 146* 100* 104* 125*  BUN '15 11 13 14  ' CREATININE 1.06 0.71 0.80 0.91  CALCIUM 7.3* 6.5* 6.4* 6.9*   Liver Function Tests:  Recent Labs Lab 03/15/15 0430 03/16/15 0500 03/17/15 0145  AST '18 17 16  ' ALT '23 20 18  ' ALKPHOS 96 100 114  BILITOT 0.9 0.8 0.7  PROT 6.0* 5.6* 5.6*  ALBUMIN 3.1* 2.9* 3.0*   No results for input(s): LIPASE, AMYLASE in the last 168 hours.  No results for input(s): AMMONIA in the last 168 hours. CBC:  Recent Labs Lab 03/14/15 0239 03/14/15 0354 03/14/15 2030 03/15/15 0430 03/16/15 0500 03/17/15 0145  WBC 0.1*  --  0.1* 0.1* 0.1* 0.1*  NEUTROABS  --   --   --   --   --  0.0*  HGB 8.4*  --  8.8* 9.6* 8.7* 8.8*  HCT 24.1*  --  25.5* 27.0* 25.2* 25.0*  MCV 90.9  --  88.2 88.5 87.2 88.3  PLT 8* 11*  30* 30*  30* 16*  16* 12*   Cardiac Enzymes:  Recent Labs Lab 03/14/15 0242 03/14/15 1015 03/14/15 1559  TROPONINI <0.03 <0.03 <0.03   BNP (last 3 results) No results for input(s): BNP in the last 8760 hours.  ProBNP (last 3 results) No results for input(s): PROBNP in the last 8760 hours.  CBG: No results for input(s): GLUCAP in the last 168 hours.  Recent Results (from the past 240 hour(s))  Blood Culture (routine x 2)     Status: None (Preliminary result)   Collection Time: 03/14/15  3:40 AM  Result Value Ref Range Status   Specimen Description BLOOD RIGHT ANTECUBITAL  Final   Special Requests BOTTLES DRAWN AEROBIC AND ANAEROBIC 5ML  Final   Culture   Final    NO GROWTH 2 DAYS Performed at [facility]    Report Status PENDING  Incomplete  Blood Culture (routine x 2)     Status: None (Preliminary result)   Collection Time: 03/14/15  3:45 AM  Result Value Ref Range Status   Specimen Description BLOOD PORTA CATH  Final   Special Requests BOTTLES DRAWN AEROBIC AND ANAEROBIC 4ML  Final   Culture   Final    NO GROWTH 2 DAYS Performed at [facility]    Report Status PENDING  Incomplete  Urine culture     Status: None   Collection Time: 03/14/15  3:50 AM  Result Value Ref Range Status   Specimen Description URINE, CLEAN CATCH  Final   Special Requests NONE  Final   Culture   Final    NO GROWTH 1 DAY Performed at [facility]    Report Status 03/15/2015 FINAL  Final     Studies: No results found.  Scheduled Meds: . sodium chloride   Intravenous Once  . sodium chloride   Intravenous Once  . antiseptic oral rinse  15 mL Mouth Rinse 6 X Daily  . diphenhydrAMINE  25 mg Oral Once  . famciclovir  500 mg Oral Daily  . fentaNYL  75 mcg Transdermal Q72H  . fentaNYL (SUBLIMAZE) injection  25 mcg Intravenous Once  . gabapentin  600 mg Oral TID  . imipenem-cilastatin  500 mg Intravenous Q6H  . ketorolac  30 mg Intravenous Once  . lidocaine  1  patch Transdermal Q24H  . montelukast  10 mg Oral QHS  . pantoprazole  40 mg Oral Daily  . saccharomyces boulardii  250 mg Oral BID  . sodium bicarbonate/sodium chloride   Mouth Rinse Q6H   Continuous Infusions:      Time spent: 25 minutes    Mark [name]  [facility] Pager [phone] If 7PM-7AM, please contact night-coverage at [email or web address], password [facility] 03/17/2015, 12:14 PM  LOS: 3 days

## 2015-03-17 NOTE — Progress Notes (Signed)
ANTIBIOTIC CONSULT NOTE  Pharmacy Consult for Primaxin Indication: Febrile Neutropenia  Allergies  Allergen Reactions  . Gadolinium Derivatives Nausea And Vomiting    Pt became very nauseous and got sick.     Patient Measurements: Height: _0  (180.3 cm) Weight: 180 lb (81.647 kg) IBW/kg (Calculated) : 75.3  Vital Signs: Temp: 99.3 F (37.4 C) (08/16 0422) Temp Source: Oral (08/16 0422) BP: 130/65 mmHg (08/16 0422) Pulse Rate: 81 (08/16 0422) Intake/Output from previous day: 08/15 0701 - 08/16 0700 In: 2640 [P.O.:1440; I.V.:800; IV Piggyback:400] Out: -  Intake/Output from this shift:    Labs:  Recent Labs  03/15/15 0430 03/16/15 0500 03/17/15 0145  WBC 0.1* 0.1* 0.1*  HGB 9.6* 8.7* 8.8*  PLT 30*  30* 16*  16* 12*  CREATININE 0.71 0.80 0.91   Estimated Creatinine Clearance: 112.6 mL/min (by C-G formula based on Cr of 0.91). No results for input(s): VANCOTROUGH, VANCOPEAK, VANCORANDOM, GENTTROUGH, GENTPEAK, GENTRANDOM, TOBRATROUGH, TOBRAPEAK, TOBRARND, AMIKACINPEAK, AMIKACINTROU, AMIKACIN in the last 72 hours.   Microbiology: Recent Results (from the past 720 hour(s))  Culture, blood (routine x 2) Call MD if unable to obtain prior to antibiotics being given     Status: None   Collection Time: 02/28/15  1:20 AM  Result Value Ref Range Status   Specimen Description BLOOD LEFT HAND  Final   Special Requests BOTTLES DRAWN AEROBIC ONLY 5CC  Final   Culture   Final    NO GROWTH 5 DAYS Performed at [facility]    Report Status 03/05/2015 FINAL  Final  Culture, blood (routine x 2) Call MD if unable to obtain prior to antibiotics being given     Status: None   Collection Time: 02/28/15  1:22 AM  Result Value Ref Range Status   Specimen Description BLOOD LEFT ANTECUBITAL  Final   Special Requests BOTTLES DRAWN AEROBIC ONLY 5CC  Final   Culture   Final    NO GROWTH 5 DAYS Performed at [facility]    Report Status 03/05/2015 FINAL  Final   Culture, sputum-assessment     Status: None   Collection Time: 03/01/15  2:10 PM  Result Value Ref Range Status   Specimen Description SPUTUM  Final   Special Requests NONE  Final   Sputum evaluation   Final    MICROSCOPIC FINDINGS SUGGEST THAT THIS SPECIMEN IS NOT REPRESENTATIVE OF LOWER RESPIRATORY SECRETIONS. PLEASE RECOLLECT. NOTIFIED JOHNSON,A AT [id number] ON [id number] BY HOOKER,B    Report Status 03/01/2015 FINAL  Final  Blood Culture (routine x 2)     Status: None (Preliminary result)   Collection Time: 03/14/15  3:40 AM  Result Value Ref Range Status   Specimen Description BLOOD RIGHT ANTECUBITAL  Final   Special Requests BOTTLES DRAWN AEROBIC AND ANAEROBIC 5ML  Final   Culture   Final    NO GROWTH 2 DAYS Performed at [facility]    Report Status PENDING  Incomplete  Blood Culture (routine x 2)     Status: None (Preliminary result)   Collection Time: 03/14/15  3:45 AM  Result Value Ref Range Status   Specimen Description BLOOD PORTA CATH  Final   Special Requests BOTTLES DRAWN AEROBIC AND ANAEROBIC 4ML  Final   Culture   Final    NO GROWTH 2 DAYS Performed at [facility]    Report Status PENDING  Incomplete  Urine culture     Status: None   Collection Time: 03/14/15  3:50 AM  Result Value Ref Range Status   Specimen Description URINE, CLEAN CATCH  Final   Special Requests NONE  Final   Culture   Final    NO GROWTH 1 DAY Performed at [facility]    Report Status 03/15/2015 FINAL  Final    Medical History: Past Medical History  Diagnosis Date  . History of radiation therapy 02/07/13- 02/26/13    lower L spine, upper sacrum, 35 gray in 14 fractions  . FH: chemotherapy     Dr. Burney Gauze  . Cancer   . Multiple myeloma dx'd 01/2013  . GERD (gastroesophageal reflux disease)     Medications:  Scheduled:  . sodium chloride   Intravenous Once  . sodium chloride   Intravenous Once  . antiseptic oral rinse  15 mL Mouth Rinse 6 X Daily  .  diphenhydrAMINE  25 mg Oral Once  . famciclovir  500 mg Oral Daily  . fentaNYL  75 mcg Transdermal Q72H  . fentaNYL (SUBLIMAZE) injection  25 mcg Intravenous Once  . gabapentin  600 mg Oral TID  . imipenem-cilastatin  500 mg Intravenous Q6H  . ketorolac  30 mg Intravenous Once  . lidocaine  1 patch Transdermal Q24H  . montelukast  10 mg Oral QHS  . pantoprazole  40 mg Oral Daily  . saccharomyces boulardii  250 mg Oral BID  . sodium bicarbonate/sodium chloride   Mouth Rinse Q6H   Infusions:    Assessment: 44 yo male with multiple myeloma undergoing chemotherapy with complaint of chest pain x 3 weeks. He was on Levofloxacin 569m daily for pneumonia PTA (last dose 8/12).  He was admitted with sepsis/active bleeding and started on Primaxin for febrile neutropenia.  He remains pancytopenic.  He is afebrile.  Cx data remains negative to date.  Scr is trending up, I/O not adequately documented.  Estimated CrCl > 102mmin.        Goal of Therapy:  Eradication of infection Dosing based on renal function  Plan:  Follow up culture results  Primaxin 50023mV q6h  MicNetta CedarsharmD, BCPS Pager: [phone] 850116/2016,7:25 AM

## 2015-03-17 NOTE — Progress Notes (Signed)
[name] is feeling okay. He still having some nosebleeds. I will go ahead and give him some platelets.  He had a low-grade temperature. He is on IV antibiotics with Primaxin. So far, all the cultures have been negative.  He's had no nausea or vomiting. He's had no diarrhea.  He is out of bed.  He really wants to move down to the third floor. I don't see any problems with him going down to the third floor. His cardiac monitor has been with normal sinus rhythm.  He is not having any problems with increased pain.  His calcium has been doing well. It is 6.9 today.  [name], his vital signs are stable. His temperature 99.3. Blood pressure 130/65. Pulse is 81. Oral exam shows no mucositis. Lungs are clear. Cardiac exam regular rate and rhythm with no murmurs, rubs or bruits. Abdomen is soft. He has good bowel sounds. There is no guarding. He has no palpable hepatosplenomegaly. Extremities shows no clubbing, cyanosis or edema. Skin exam shows no ecchymoses or petechia. Neurological exam is nonfocal.  He is still neutropenic. I would like to believe that his white cell count will start to come up sometime this week.  We will go ahead and give him platelets.  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  I appreciate all the great care that he is gotten so far. He is very appreciative of the wonderful care that he is receiving up on [address].  Mark E.  Romans [name]

## 2015-03-18 ENCOUNTER — Ambulatory Visit: Payer: BLUE CROSS/BLUE SHIELD

## 2015-03-18 ENCOUNTER — Other Ambulatory Visit: Payer: BLUE CROSS/BLUE SHIELD

## 2015-03-18 DIAGNOSIS — C9000 Multiple myeloma not having achieved remission: Secondary | ICD-10-CM

## 2015-03-18 DIAGNOSIS — IMO0001 Reserved for inherently not codable concepts without codable children: Secondary | ICD-10-CM | POA: Insufficient documentation

## 2015-03-18 DIAGNOSIS — D72819 Decreased white blood cell count, unspecified: Secondary | ICD-10-CM

## 2015-03-18 DIAGNOSIS — D649 Anemia, unspecified: Secondary | ICD-10-CM

## 2015-03-18 DIAGNOSIS — A419 Sepsis, unspecified organism: Secondary | ICD-10-CM

## 2015-03-18 DIAGNOSIS — Z9484 Stem cells transplant status: Secondary | ICD-10-CM

## 2015-03-18 DIAGNOSIS — D709 Neutropenia, unspecified: Secondary | ICD-10-CM | POA: Insufficient documentation

## 2015-03-18 LAB — CBC WITH DIFFERENTIAL/PLATELET
BAND NEUTROPHILS: 0 % (ref 0–10)
BASOS PCT: 0 % (ref 0–1)
Basophils Absolute: 0 10*3/uL (ref 0.0–0.1)
Blasts: 0 %
EOS ABS: 0 10*3/uL (ref 0.0–0.7)
EOS PCT: 0 % (ref 0–5)
HCT: 24.4 % — ABNORMAL LOW (ref 39.0–52.0)
Hemoglobin: 8.3 g/dL — ABNORMAL LOW (ref 13.0–17.0)
LYMPHS ABS: 0.1 10*3/uL — AB (ref 0.7–4.0)
Lymphocytes Relative: 60 % — ABNORMAL HIGH (ref 12–46)
MCH: 30.3 pg (ref 26.0–34.0)
MCHC: 34 g/dL (ref 30.0–36.0)
MCV: 89.1 fL (ref 78.0–100.0)
MONO ABS: 0 10*3/uL — AB (ref 0.1–1.0)
MYELOCYTES: 0 %
Metamyelocytes Relative: 0 %
Monocytes Relative: 4 % (ref 3–12)
NRBC: 0 /100{WBCs}
Neutro Abs: 0.1 10*3/uL — ABNORMAL LOW (ref 1.7–7.7)
Neutrophils Relative %: 36 % — ABNORMAL LOW (ref 43–77)
OTHER: 0 %
PLATELETS: 33 10*3/uL — AB (ref 150–400)
PROMYELOCYTES ABS: 0 %
RBC: 2.74 MIL/uL — ABNORMAL LOW (ref 4.22–5.81)
RDW: 14.1 % (ref 11.5–15.5)
WBC: 0.2 10*3/uL — CL (ref 4.0–10.5)

## 2015-03-18 LAB — PREPARE PLATELET PHERESIS: Unit division: 0

## 2015-03-18 LAB — DIC (DISSEMINATED INTRAVASCULAR COAGULATION) PANEL
APTT: 27 s (ref 24–37)
D DIMER QUANT: 0.6 ug{FEU}/mL — AB (ref 0.00–0.48)
FIBRINOGEN: 544 mg/dL — AB (ref 204–475)
INR: 1 (ref 0.00–1.49)
PLATELETS: 34 10*3/uL — AB (ref 150–400)
PROTHROMBIN TIME: 13.4 s (ref 11.6–15.2)

## 2015-03-18 LAB — COMPREHENSIVE METABOLIC PANEL
ALT: 18 U/L (ref 17–63)
ANION GAP: 7 (ref 5–15)
AST: 14 U/L — ABNORMAL LOW (ref 15–41)
Albumin: 3.2 g/dL — ABNORMAL LOW (ref 3.5–5.0)
Alkaline Phosphatase: 121 U/L (ref 38–126)
BUN: 13 mg/dL (ref 6–20)
CHLORIDE: 110 mmol/L (ref 101–111)
CO2: 27 mmol/L (ref 22–32)
Calcium: 7 mg/dL — ABNORMAL LOW (ref 8.9–10.3)
Creatinine, Ser: 0.8 mg/dL (ref 0.61–1.24)
Glucose, Bld: 97 mg/dL (ref 65–99)
POTASSIUM: 4.1 mmol/L (ref 3.5–5.1)
SODIUM: 144 mmol/L (ref 135–145)
Total Bilirubin: 0.5 mg/dL (ref 0.3–1.2)
Total Protein: 5.9 g/dL — ABNORMAL LOW (ref 6.5–8.1)

## 2015-03-18 LAB — KAPPA/LAMBDA LIGHT CHAINS: KAPPA FREE LGHT CHN: 175.71 mg/L — AB (ref 3.30–19.40)

## 2015-03-18 LAB — GLUCOSE, CAPILLARY
GLUCOSE-CAPILLARY: 83 mg/dL (ref 65–99)
Glucose-Capillary: 330 mg/dL — ABNORMAL HIGH (ref 65–99)
Glucose-Capillary: 130 mg/dL — ABNORMAL HIGH (ref 65–99)
Glucose-Capillary: 139 mg/dL — ABNORMAL HIGH (ref 65–99)

## 2015-03-18 LAB — DIC (DISSEMINATED INTRAVASCULAR COAGULATION)PANEL: Smear Review: NONE SEEN

## 2015-03-18 MED ORDER — INSULIN ASPART 100 UNIT/ML ~~LOC~~ SOLN: 0.0000 [IU] | Freq: Every day | SUBCUTANEOUS | Status: DC

## 2015-03-18 MED ORDER — INSULIN ASPART 100 UNIT/ML ~~LOC~~ SOLN: 0.0000 [IU] | Freq: Three times a day (TID) | SUBCUTANEOUS | Status: DC

## 2015-03-18 NOTE — Progress Notes (Signed)
[facility] PROGRESS NOTE  Mark [name] HER:[id number] DOB: February 06, 1971 DOA: 03/14/2015 PCP: No PCP Per Patient  Assessment/Plan: Pancytopenia -Continued on neutropenic precautions. Remains on empiric Primaxin since admission. Blood cx negative. . Continue famciclovir. -WBC trending up to 0.2 from 0.1, hopefully sign of recovery  DIC -Fibrinogen and d-dimer elevate and remain stable. Has off and on epistaxis. . APTT normal. Received FFP and PRBC on 8/13. Will continue to monitor.  Left-sided chest wall pain with shortness of breath -Chest x-ray unremarkable. CT angiogram of the chest one month ago was negative for PE. Pain is likely musculoskeletal. Continue with fentanyl patch and Lidoderm patch. Pain has Improved after increasing frequency of diluadid.  Refractory multiple myeloma -Recent chemotherapy with pancytopenia. Appreciate oncology follow-up. Urine IFE and light chain ordered. Continue to monitor.  Severe anemia and thrombocytopenia Transfused with irradiated platelets ( 1 u) and PRBC( 2 units) on 8/13. Platelets dropped again today. Ordered 1 u platelet.  Epistaxis Secondary to early DIC. Off and on. Hb stable.   Headache and dizziness Improved with IV fluids  Hypokalemia Replenished  DVT prophylaxis:SCD/ ambulate.  Code Status: Full Family Communication: Pt in room (indicate person spoken with, relationship, and if by phone, the number) Disposition Plan: Pending   Consultants:  Oncology  Procedures:    Antibiotics:  Vancomycin 8/12>>>  Primaxin 8/12>>>  HPI/Subjective: No complaints. Denies bleeding  Objective: Filed Vitals:   03/17/15 1440 03/17/15 2037 03/18/15 0555 03/18/15 1353  BP: 134/77 130/81 134/76 131/84  Pulse: 84 91 78 71  Temp: 97.8 F (36.6 C) 98.9 F (37.2 C) 98.4 F (36.9 C) 98.4 F (36.9 C)  TempSrc: Oral Oral Oral Oral  Resp: '18 18 14 18  ' Height:      Weight:      SpO2:  99% 99% 98%    Intake/Output  Summary (Last 24 hours) at 03/18/15 1455 Last data filed at 03/18/15 0907  Gross per 24 hour  Intake    840 ml  Output   1200 ml  Net   -360 ml   Filed Weights   03/14/15 0331 03/14/15 0511 03/17/15 1100  Weight: 81.647 kg (180 lb) 81.647 kg (180 lb) 82.101 kg (181 lb)    Exam:   General:  Asleep, easily arousable, in nad  Cardiovascular: regular, s1, s2  Respiratory: normal resp effort, no wheezing  Abdomen: soft,nondistended  Musculoskeletal: perfused, no clubbing   Data Reviewed: Basic Metabolic Panel:  Recent Labs Lab 03/14/15 0239 03/15/15 0430 03/16/15 0500 03/17/15 0145 03/18/15 0420  NA 142 143 142 143 144  K 3.6 3.2* 3.5 3.8 4.1  CL 110 110 109 111 110  CO2 '23 27 27 25 27  ' GLUCOSE 146* 100* 104* 125* 97  BUN '15 11 13 14 13  ' CREATININE 1.06 0.71 0.80 0.91 0.80  CALCIUM 7.3* 6.5* 6.4* 6.9* 7.0*   Liver Function Tests:  Recent Labs Lab 03/15/15 0430 03/16/15 0500 03/17/15 0145 03/18/15 0420  AST '18 17 16 ' 14*  ALT '23 20 18 18  ' ALKPHOS 96 100 114 121  BILITOT 0.9 0.8 0.7 0.5  PROT 6.0* 5.6* 5.6* 5.9*  ALBUMIN 3.1* 2.9* 3.0* 3.2*   No results for input(s): LIPASE, AMYLASE in the last 168 hours. No results for input(s): AMMONIA in the last 168 hours. CBC:  Recent Labs Lab 03/14/15 2030 03/15/15 0430 03/16/15 0500 03/17/15 0145 03/18/15 0420  WBC 0.1* 0.1* 0.1* 0.1* 0.2*  NEUTROABS  --   --   --  0.0* 0.1*  HGB 8.8* 9.6* 8.7* 8.8* 8.3*  HCT 25.5* 27.0* 25.2* 25.0* 24.4*  MCV 88.2 88.5 87.2 88.3 89.1  PLT 30* 30*  30* 16*  16* 12* 34*  33*   Cardiac Enzymes:  Recent Labs Lab 03/14/15 0242 03/14/15 1015 03/14/15 1559  TROPONINI <0.03 <0.03 <0.03   BNP (last 3 results) No results for input(s): BNP in the last 8760 hours.  ProBNP (last 3 results) No results for input(s): PROBNP in the last 8760 hours.  CBG:  Recent Labs Lab 03/18/15 1050  GLUCAP 330*    Recent Results (from the past 240 hour(s))  Blood Culture  (routine x 2)     Status: None (Preliminary result)   Collection Time: 03/14/15  3:40 AM  Result Value Ref Range Status   Specimen Description BLOOD RIGHT ANTECUBITAL  Final   Special Requests BOTTLES DRAWN AEROBIC AND ANAEROBIC 5ML  Final   Culture   Final    NO GROWTH 4 DAYS Performed at [facility]    Report Status PENDING  Incomplete  Blood Culture (routine x 2)     Status: None (Preliminary result)   Collection Time: 03/14/15  3:45 AM  Result Value Ref Range Status   Specimen Description BLOOD PORTA CATH  Final   Special Requests BOTTLES DRAWN AEROBIC AND ANAEROBIC 4ML  Final   Culture   Final    NO GROWTH 4 DAYS Performed at [facility]    Report Status PENDING  Incomplete  Urine culture     Status: None   Collection Time: 03/14/15  3:50 AM  Result Value Ref Range Status   Specimen Description URINE, CLEAN CATCH  Final   Special Requests NONE  Final   Culture   Final    NO GROWTH 1 DAY Performed at [facility]    Report Status 03/15/2015 FINAL  Final     Studies: No results found.  Scheduled Meds: . sodium chloride   Intravenous Once  . antiseptic oral rinse  15 mL Mouth Rinse 6 X Daily  . famciclovir  500 mg Oral Daily  . fentaNYL  75 mcg Transdermal Q72H  . fentaNYL (SUBLIMAZE) injection  25 mcg Intravenous Once  . gabapentin  600 mg Oral TID  . imipenem-cilastatin  500 mg Intravenous Q6H  . insulin aspart  0-15 Units Subcutaneous TID WC  . insulin aspart  0-5 Units Subcutaneous QHS  . lidocaine  1 patch Transdermal Q24H  . montelukast  10 mg Oral QHS  . pantoprazole  40 mg Oral Daily  . saccharomyces boulardii  250 mg Oral BID  . sodium bicarbonate/sodium chloride   Mouth Rinse Q6H   Continuous Infusions:   Active Problems:   Multiple myeloma   Anemia   Thrombocytopenia   Febrile neutropenia   DIC (disseminated intravascular coagulation)   Antineoplastic chemotherapy induced pancytopenia   Mark [name], [place]  [facility] Pager [phone]. If 7PM-7AM, please contact night-coverage at [email or web address], password [facility] 03/18/2015, 2:55 PM  LOS: 4 days

## 2015-03-18 NOTE — Progress Notes (Signed)
[name] continues to do well. His white cell count is 0.2. Hopefully, this is a sign that things are improving.  He tolerated platelets well. He is not having any nosebleeds.  He's had no cough or shortness of breath. He is walking.  He's had no fever. All cultures are negative. He continues on Primaxin.  He's had no issues with his bowels or bladder. A 24-hour urine is in progress right now.  He's had no mouth sores. His appetite is good. He really has not had any problems with nausea or vomiting.  There does not appear to be any issues with pain.  His vital signs are all stable. Temperature 98.4. Blood pressure 134/76. Pulse is 78. Head and neck exam shows no ocular or oral lesions. There is no mucositis. Lungs are clear. Cardiac exam regular in rhythm with no murmurs, rubs or bruits. Abdomen is soft. He has good bowel sounds. There is no fluid wave. There is no palpable liver or spleen tip. Back exam shows no tenderness over the spine, ribs or hips. Extremities shows no clubbing, cyanosis or edema. Neurological exam is nonfocal.  His white cell count is 0.2. Hemoglobin 8.3. Platelet count 33,000. Albumin 3.2. Calcium 7.0.  Hopefully, his white cell count is coming up. I think the labs tomorrow will be very essential.  I would hold off on blood transfusion for right now. I think given his hemoglobin gets below 8, then we may have to consider transfusing him.  I very much appreciate all they great care that he is getting from everybody on [place] 94:22

## 2015-03-19 DIAGNOSIS — D61818 Other pancytopenia: Secondary | ICD-10-CM

## 2015-03-19 LAB — COMPREHENSIVE METABOLIC PANEL
ALK PHOS: 136 U/L — AB (ref 38–126)
ALT: 15 U/L — AB (ref 17–63)
AST: 15 U/L (ref 15–41)
Albumin: 3.4 g/dL — ABNORMAL LOW (ref 3.5–5.0)
Anion gap: 9 (ref 5–15)
BUN: 10 mg/dL (ref 6–20)
CALCIUM: 7 mg/dL — AB (ref 8.9–10.3)
CHLORIDE: 105 mmol/L (ref 101–111)
CO2: 27 mmol/L (ref 22–32)
CREATININE: 0.84 mg/dL (ref 0.61–1.24)
GFR calc Af Amer: >60 mL/min (ref >60)
Glucose, Bld: 105 mg/dL — ABNORMAL HIGH (ref 65–99)
Potassium: 4.2 mmol/L (ref 3.5–5.1)
Sodium: 141 mmol/L (ref 135–145)
Total Bilirubin: 0.7 mg/dL (ref 0.3–1.2)
Total Protein: 6 g/dL — ABNORMAL LOW (ref 6.5–8.1)

## 2015-03-19 LAB — CBC WITH DIFFERENTIAL/PLATELET
BASOS PCT: 0 % (ref 0–1)
Basophils Absolute: 0 10*3/uL (ref 0.0–0.1)
EOS PCT: 0 % (ref 0–5)
Eosinophils Absolute: 0 10*3/uL (ref 0.0–0.7)
HCT: 24.9 % — ABNORMAL LOW (ref 39.0–52.0)
HEMOGLOBIN: 8.5 g/dL — AB (ref 13.0–17.0)
LYMPHS PCT: 26 % (ref 12–46)
Lymphs Abs: 0.1 10*3/uL — ABNORMAL LOW (ref 0.7–4.0)
MCH: 30.5 pg (ref 26.0–34.0)
MCHC: 34.1 g/dL (ref 30.0–36.0)
MCV: 89.2 fL (ref 78.0–100.0)
MONO ABS: 0 10*3/uL — AB (ref 0.1–1.0)
MONOS PCT: 7 % (ref 3–12)
NEUTROS PCT: 67 % (ref 43–77)
Neutro Abs: 0.2 10*3/uL — ABNORMAL LOW (ref 1.7–7.7)
PLATELETS: 20 10*3/uL — AB (ref 150–400)
RBC: 2.79 MIL/uL — AB (ref 4.22–5.81)
RDW: 13.8 % (ref 11.5–15.5)
WBC: 0.3 10*3/uL — AB (ref 4.0–10.5)

## 2015-03-19 LAB — CULTURE, BLOOD (ROUTINE X 2)
CULTURE: NO GROWTH
CULTURE: NO GROWTH

## 2015-03-19 LAB — GLUCOSE, CAPILLARY
GLUCOSE-CAPILLARY: 101 mg/dL — AB (ref 65–99)
Glucose-Capillary: 165 mg/dL — ABNORMAL HIGH (ref 65–99)

## 2015-03-19 MED ORDER — SODIUM CHLORIDE 0.9 % IV SOLN
1.0000 g | Freq: Once | INTRAVENOUS | Status: AC
  Administered 2015-03-19: 1 g via INTRAVENOUS
  Filled 2015-03-19: qty 10

## 2015-03-19 NOTE — Progress Notes (Signed)
[facility] PROGRESS NOTE  Giovani Kanitz MRN:[id number] DOB: 12/02/1970 DOA: 03/14/2015 PCP: No PCP Per Patient  Assessment/Plan: Pancytopenia -Continued on neutropenic precautions. Remains on empiricPrimaxin since admission with plans to continue until WBC reaches 1k per Oncology recs. Blood cx negative. Continue famciclovir. -WBC trending up to 0.3 from 0.1.   DIC -Fibrinogen and d-dimer elevate and remain stable. Has off and on epistaxis. . APTT normal. Received FFP and PRBC on 8/13. Will continue to monitor.  Left-sided chest wall pain with shortness of breath -Chest x-ray unremarkable. CT angiogram of the chest one month ago was negative for PE. Pain is likely musculoskeletal. Continue with fentanyl patch and Lidoderm patch. Pain has Improved after increasing frequency of diluadid.  Refractory multiple myeloma -Recent chemotherapy with pancytopenia. Appreciate oncology follow-up. Urine IFE and light chain ordered. Continue to monitor.  Severe anemia and thrombocytopenia Transfused with irradiated platelets ( 1 u) and PRBC( 2 units) on 8/13.  Platelets 20k today. Cont to monitor  Epistaxis Secondary to early DIC. Off and on. Hb stable.   Headache and dizziness Improved with IV fluids  Hypokalemia Replenished  DVT prophylaxis:SCD/ ambulate.  Code Status: Full Family Communication: Pt in room Disposition Plan: Pending   Consultants:  Oncology  Procedures:    Antibiotics:  Vancomycin 8/12>>>  Primaxin 8/12>>>  HPI/Subjective: Patient is without complaints  Objective: Filed Vitals:   03/18/15 2108 03/19/15 0038 03/19/15 0534 03/19/15 1540  BP: 119/63  118/72 100/74  Pulse: 83 84 91 95  Temp: 99.3 F (37.4 C)  98.9 F (37.2 C) 98.8 F (37.1 C)  TempSrc: Oral  Oral Oral  Resp: 14  16 18  Height:      Weight:      SpO2: 97%  97% 98%    Intake/Output Summary (Last 24 hours) at 03/19/15 1707 Last data filed at 03/19/15 1431  Gross per 24  hour  Intake   1426 ml  Output      0 ml  Net   1426 ml   Filed Weights   03/14/15 0331 03/14/15 0511 03/17/15 1100  Weight: 81.647 kg (180 lb) 81.647 kg (180 lb) 82.101 kg (181 lb)    Exam:   General:  Awake, ambulating in hallway, in nad  Cardiovascular: regular, s1, s2  Respiratory: normal resp effort, no wheezing  Abdomen: soft,nondistended  Musculoskeletal: perfused, no clubbing   Data Reviewed: Basic Metabolic Panel:  Recent Labs Lab 03/15/15 0430 03/16/15 0500 03/17/15 0145 03/18/15 0420 03/19/15 0535  NA 143 142 143 144 141  K 3.2* 3.5 3.8 4.1 4.2  CL 110 109 111 110 105  CO2 27 27 25 27 27  GLUCOSE 100* 104* 125* 97 105*  BUN 11 13 14 13 10  CREATININE 0.71 0.80 0.91 0.80 0.84  CALCIUM 6.5* 6.4* 6.9* 7.0* 7.0*   Liver Function Tests:  Recent Labs Lab 03/15/15 0430 03/16/15 0500 03/17/15 0145 03/18/15 0420 03/19/15 0535  AST 18 17 16 14* 15  ALT 23 20 18 18 15*  ALKPHOS 96 100 114 121 136*  BILITOT 0.9 0.8 0.7 0.5 0.7  PROT 6.0* 5.6* 5.6* 5.9* 6.0*  ALBUMIN 3.1* 2.9* 3.0* 3.2* 3.4*   No results for input(s): LIPASE, AMYLASE in the last 168 hours. No results for input(s): AMMONIA in the last 168 hours. CBC:  Recent Labs Lab 03/15/15 0430 03/16/15 0500 03/17/15 0145 03/18/15 0420 03/19/15 0535  WBC 0.1* 0.1* 0.1* 0.2* 0.3*  NEUTROABS  --   --  0.0* 0.1* 0.2*    HGB 9.6* 8.7* 8.8* 8.3* 8.5*  HCT 27.0* 25.2* 25.0* 24.4* 24.9*  MCV 88.5 87.2 88.3 89.1 89.2  PLT 30*  30* 16*  16* 12* 34*  33* 20*   Cardiac Enzymes:  Recent Labs Lab 03/14/15 0242 03/14/15 1015 03/14/15 1559  TROPONINI <0.03 <0.03 <0.03   BNP (last 3 results) No results for input(s): BNP in the last 8760 hours.  ProBNP (last 3 results) No results for input(s): PROBNP in the last 8760 hours.  CBG:  Recent Labs Lab 03/18/15 1050 03/18/15 1239 03/18/15 1732 03/18/15 2012 03/19/15 0738  GLUCAP 330* 130* 83 139* 101*    Recent Results (from the past  240 hour(s))  Blood Culture (routine x 2)     Status: None   Collection Time: 03/14/15  3:40 AM  Result Value Ref Range Status   Specimen Description BLOOD RIGHT ANTECUBITAL  Final   Special Requests BOTTLES DRAWN AEROBIC AND ANAEROBIC 5ML  Final   Culture   Final    NO GROWTH 5 DAYS Performed at [facility]    Report Status 03/19/2015 FINAL  Final  Blood Culture (routine x 2)     Status: None   Collection Time: 03/14/15  3:45 AM  Result Value Ref Range Status   Specimen Description BLOOD PORTA CATH  Final   Special Requests BOTTLES DRAWN AEROBIC AND ANAEROBIC 4ML  Final   Culture   Final    NO GROWTH 5 DAYS Performed at [facility]    Report Status 03/19/2015 FINAL  Final  Urine culture     Status: None   Collection Time: 03/14/15  3:50 AM  Result Value Ref Range Status   Specimen Description URINE, CLEAN CATCH  Final   Special Requests NONE  Final   Culture   Final    NO GROWTH 1 DAY Performed at [facility]    Report Status 03/15/2015 FINAL  Final     Studies: No results found.  Scheduled Meds: . antiseptic oral rinse  15 mL Mouth Rinse 6 X Daily  . famciclovir  500 mg Oral Daily  . fentaNYL  75 mcg Transdermal Q72H  . gabapentin  600 mg Oral TID  . imipenem-cilastatin  500 mg Intravenous Q6H  . insulin aspart  0-15 Units Subcutaneous TID WC  . insulin aspart  0-5 Units Subcutaneous QHS  . lidocaine  1 patch Transdermal Q24H  . pantoprazole  40 mg Oral Daily  . saccharomyces boulardii  250 mg Oral BID  . sodium bicarbonate/sodium chloride   Mouth Rinse Q6H   Continuous Infusions:   Active Problems:   Multiple myeloma   Anemia   Thrombocytopenia   Febrile neutropenia   DIC (disseminated intravascular coagulation)   Antineoplastic chemotherapy induced pancytopenia   Neutropenia   Blood poisoning   Marnita Poirier, [place] [facility] Pager [phone]. If 7PM-7AM, please contact night-coverage at [email or web address], password  [facility] 03/19/2015, 5:07 PM  LOS: 5 days

## 2015-03-19 NOTE — Progress Notes (Signed)
Mr. Near is doing well.  He had no problems yesterday. He feels a little bit tired.  His white cell count continues to come up very slowly. Is 0.3 today. His hemoglobin is 8.5. His platelet count is 20,000.  He's not had any nosebleeds. As such, I will hold off on any kind of platelet transfusions right now.  He continues on Primaxin. I would continue him on this until his white cell count is above 1000.  He's had no diarrhea.  He's had no rashes.  Pain control seems to be pretty good right now.  [name], his vital signs are stable. Temperature 98.9. Blood pressure 118/72. Pulse is 91. Oral exam shows no mucositis. He has no adenopathy in the neck. Lungs are clear bilaterally. Cardiac exam regular rate and rhythm with no murmurs, rubs or bruits. Abdomen soft. Bowel sounds present. There is no guarding or rebound tenderness. He has no fluid wave. Extremities shows no clubbing, cyanosis or edema. Skin exam shows no rashes, ecchymoses or petechia. Neurological exam is nonfocal.  His pancytopenia is gradually resolving. Again, I would not feel comfortable with him being discharged until his white cell count is over 1000. Hopefully, that will happen over the weekend.  I appreciate the great care that he is getting. He is very complementary of everybody who is involved with taking care of him on [address]  Pete E.  1 Chronicles 4:9-10

## 2015-03-19 NOTE — Progress Notes (Signed)
WBC 0.3 and platelets 20 today. Told Dr Marin Olp this am the results.

## 2015-03-20 DIAGNOSIS — R079 Chest pain, unspecified: Secondary | ICD-10-CM

## 2015-03-20 DIAGNOSIS — R0602 Shortness of breath: Secondary | ICD-10-CM

## 2015-03-20 DIAGNOSIS — D696 Thrombocytopenia, unspecified: Secondary | ICD-10-CM

## 2015-03-20 DIAGNOSIS — D709 Neutropenia, unspecified: Secondary | ICD-10-CM

## 2015-03-20 DIAGNOSIS — R5081 Fever presenting with conditions classified elsewhere: Secondary | ICD-10-CM

## 2015-03-20 DIAGNOSIS — D65 Disseminated intravascular coagulation [defibrination syndrome]: Secondary | ICD-10-CM

## 2015-03-20 LAB — CBC WITH DIFFERENTIAL/PLATELET
BASOS ABS: 0 10*3/uL (ref 0.0–0.1)
Basophils Relative: 0 % (ref 0–1)
EOS ABS: 0 10*3/uL (ref 0.0–0.7)
EOS PCT: 0 % (ref 0–5)
HCT: 25.8 % — ABNORMAL LOW (ref 39.0–52.0)
Hemoglobin: 9 g/dL — ABNORMAL LOW (ref 13.0–17.0)
LYMPHS ABS: 0.1 10*3/uL — AB (ref 0.7–4.0)
Lymphocytes Relative: 27 % (ref 12–46)
MCH: 31.6 pg (ref 26.0–34.0)
MCHC: 34.9 g/dL (ref 30.0–36.0)
MCV: 90.5 fL (ref 78.0–100.0)
MONO ABS: 0.1 10*3/uL (ref 0.1–1.0)
Monocytes Relative: 35 % — ABNORMAL HIGH (ref 3–12)
NEUTROS PCT: 38 % — AB (ref 43–77)
Neutro Abs: 0.1 10*3/uL — ABNORMAL LOW (ref 1.7–7.7)
PLATELETS: 15 10*3/uL — AB (ref 150–400)
RBC: 2.85 MIL/uL — AB (ref 4.22–5.81)
RDW: 14 % (ref 11.5–15.5)
WBC: 0.3 10*3/uL — AB (ref 4.0–10.5)

## 2015-03-20 LAB — COMPREHENSIVE METABOLIC PANEL
ALBUMIN: 3.5 g/dL (ref 3.5–5.0)
ALT: 16 U/L — ABNORMAL LOW (ref 17–63)
ANION GAP: 10 (ref 5–15)
AST: 14 U/L — AB (ref 15–41)
Alkaline Phosphatase: 144 U/L — ABNORMAL HIGH (ref 38–126)
BILIRUBIN TOTAL: 1 mg/dL (ref 0.3–1.2)
BUN: 14 mg/dL (ref 6–20)
CHLORIDE: 108 mmol/L (ref 101–111)
CO2: 25 mmol/L (ref 22–32)
Calcium: 7.3 mg/dL — ABNORMAL LOW (ref 8.9–10.3)
Creatinine, Ser: 0.89 mg/dL (ref 0.61–1.24)
GFR calc Af Amer: >60 mL/min (ref >60)
GFR calc non Af Amer: >60 mL/min (ref >60)
GLUCOSE: 115 mg/dL — AB (ref 65–99)
POTASSIUM: 4.4 mmol/L (ref 3.5–5.1)
SODIUM: 143 mmol/L (ref 135–145)
TOTAL PROTEIN: 6.1 g/dL — AB (ref 6.5–8.1)

## 2015-03-20 LAB — GLUCOSE, CAPILLARY
GLUCOSE-CAPILLARY: 133 mg/dL — AB (ref 65–99)
GLUCOSE-CAPILLARY: 74 mg/dL (ref 65–99)
GLUCOSE-CAPILLARY: 149 mg/dL — AB (ref 65–99)
GLUCOSE-CAPILLARY: 117 mg/dL — AB (ref 65–99)
GLUCOSE-CAPILLARY: 120 mg/dL — AB (ref 65–99)
Glucose-Capillary: 101 mg/dL — ABNORMAL HIGH (ref 65–99)
Glucose-Capillary: 108 mg/dL — ABNORMAL HIGH (ref 65–99)

## 2015-03-20 LAB — UIFE/LIGHT CHAINS/TP QN, 24-HR UR
% BETA, URINE: 5.8 %
ALBUMIN, U: 14.9 %
ALPHA 1 URINE: 1.6 %
Alpha 2, Urine: 6.9 %
FREE KAPPA/LAMBDA RATIO: 5200 — AB (ref 2.04–10.37)
FREE LAMBDA LT CHAINS, UR: 0.45 mg/L (ref 0.24–6.66)
Free Lt Chn Excr Rate: 2340 mg/L — ABNORMAL HIGH (ref 1.35–24.19)
GAMMA GLOBULIN URINE: 70.9 %
M-SPIKE %, Urine: 64.8 % — ABNORMAL HIGH
M-Spike, Mg/24 Hr: 344 mg/24 hr — ABNORMAL HIGH
TOTAL PROTEIN, URINE-UR/DAY: 530.4 mg/(24.h) — AB (ref 30.0–150.0)
Total Protein, Urine: 22.1 mg/dL
VOLUME, URINE-UPE24: 2400 mL

## 2015-03-20 NOTE — Progress Notes (Signed)
Mark [name] is about the same. He had no problems yesterday. He's had no issues with fever. His white cell count of 0.3. This is stable from yesterday. His platelet count is 15,000. His hemoglobin is 9. His calcium is 7.3.  He is ambulating. He's had no issues with nausea or vomiting. He's had no diarrhea. He's had no mouth sores. He has had no epistaxis. There is no hemoptysis.  His hair is starting to come out.  He's had no problems with rashes. He does have neuropathy in his feet which is relatively stable.  He is not complaining of any shortness of breath.  [name], his vital signs are stable. His temperature 98.8. Pulse 91. Blood pressure 125/72. Head and neck exam shows no ocular or oral lesions. He has no palpable cervical or supraclavicular lymph nodes. Lungs are clear bilaterally. Cardiac exam regular rate and rhythm with a normal S1 and S2. Abdomen is soft. He has good bowel sounds. There is no fluid wave. Extremities shows no clubbing, cyanosis or edema. Skin exam shows no rashes or ecchymoses or petechia. Neurological exam is nonfocal.  He still is on IV antibiotics. His white cell count is still too low for [place] to let him go home. His platelet count is dropping a little bit but he is not bleeding so there is no need for a transfusion.  For now, he will have to stay in the hospital. If the hospitalist wish, I will be happy to take over as primary attending.  I do appreciate all the great help that he has received from everybody on [address].  Again, I will be more than happy to be the attending if it will help out the hospitalist service.  Mark [name]  Colossians 4:2

## 2015-03-20 NOTE — Progress Notes (Signed)
ANTIBIOTIC CONSULT NOTE - FOLLOW UP  Pharmacy Consult for Primaxin Indication: Febrile neutropenia  Allergies  Allergen Reactions  . Gadolinium Derivatives Nausea And Vomiting    Pt became very nauseous and got sick.     Patient Measurements: Height: '5\' 11"'  (180.3 cm) Weight: 181 lb (82.101 kg) IBW/kg (Calculated) : 75.3 Adjusted Body Weight:   Vital Signs: Temp: 98.8 F (37.1 C) (08/19 0415) Temp Source: Oral (08/19 0415) BP: 125/72 mmHg (08/19 0415) Pulse Rate: 91 (08/19 0415) Intake/Output from previous day: 08/18 0701 - 08/19 0700 In: 2098 [P.O.:1560; IV Piggyback:400] Out: -  Intake/Output from this shift:    Labs:  Recent Labs  03/18/15 0420 03/19/15 0535 03/20/15 0350  WBC 0.2* 0.3* 0.3*  HGB 8.3* 8.5* 9.0*  PLT 34*  33* 20* 15*  CREATININE 0.80 0.84 0.89   Estimated Creatinine Clearance: 115.2 mL/min (by C-G formula based on Cr of 0.89). No results for input(s): VANCOTROUGH, VANCOPEAK, VANCORANDOM, GENTTROUGH, GENTPEAK, GENTRANDOM, TOBRATROUGH, TOBRAPEAK, TOBRARND, AMIKACINPEAK, AMIKACINTROU, AMIKACIN in the last 72 hours.   Microbiology: Recent Results (from the past 720 hour(s))  Culture, blood (routine x 2) Call MD if unable to obtain prior to antibiotics being given     Status: None   Collection Time: 02/28/15  1:20 AM  Result Value Ref Range Status   Specimen Description BLOOD LEFT HAND  Final   Special Requests BOTTLES DRAWN AEROBIC ONLY 5CC  Final   Culture   Final    NO GROWTH 5 DAYS Performed at [facility]    Report Status 03/05/2015 FINAL  Final  Culture, blood (routine x 2) Call MD if unable to obtain prior to antibiotics being given     Status: None   Collection Time: 02/28/15  1:22 AM  Result Value Ref Range Status   Specimen Description BLOOD LEFT ANTECUBITAL  Final   Special Requests BOTTLES DRAWN AEROBIC ONLY 5CC  Final   Culture   Final    NO GROWTH 5 DAYS Performed at [facility]    Report Status 03/05/2015  FINAL  Final  Culture, sputum-assessment     Status: None   Collection Time: 03/01/15  2:10 PM  Result Value Ref Range Status   Specimen Description SPUTUM  Final   Special Requests NONE  Final   Sputum evaluation   Final    MICROSCOPIC FINDINGS SUGGEST THAT THIS SPECIMEN IS NOT REPRESENTATIVE OF LOWER RESPIRATORY SECRETIONS. PLEASE RECOLLECT. NOTIFIED JOHNSON,A AT [id number] ON [id number] BY HOOKER,B    Report Status 03/01/2015 FINAL  Final  Blood Culture (routine x 2)     Status: None   Collection Time: 03/14/15  3:40 AM  Result Value Ref Range Status   Specimen Description BLOOD RIGHT ANTECUBITAL  Final   Special Requests BOTTLES DRAWN AEROBIC AND ANAEROBIC 5ML  Final   Culture   Final    NO GROWTH 5 DAYS Performed at [facility]    Report Status 03/19/2015 FINAL  Final  Blood Culture (routine x 2)     Status: None   Collection Time: 03/14/15  3:45 AM  Result Value Ref Range Status   Specimen Description BLOOD PORTA CATH  Final   Special Requests BOTTLES DRAWN AEROBIC AND ANAEROBIC 4ML  Final   Culture   Final    NO GROWTH 5 DAYS Performed at [facility]    Report Status 03/19/2015 FINAL  Final  Urine culture     Status: None   Collection Time: 03/14/15  3:50  AM  Result Value Ref Range Status   Specimen Description URINE, CLEAN CATCH  Final   Special Requests NONE  Final   Culture   Final    NO GROWTH 1 DAY Performed at [facility]    Report Status 03/15/2015 FINAL  Final    Anti-infectives    Start     Dose/Rate Route Frequency Ordered Stop   03/14/15 1000  famciclovir [facility]) tablet 500 mg     500 mg Oral Daily 03/14/15 0408     03/14/15 0900  imipenem-cilastatin (PRIMAXIN) 500 mg in sodium chloride 0.9 % 100 mL IVPB     500 mg 200 mL/hr over 30 Minutes Intravenous Every 6 hours 03/14/15 0430     03/14/15 0400  levofloxacin (LEVAQUIN) IVPB 750 mg  Status:  Discontinued     750 mg 100 mL/hr over 90 Minutes Intravenous Every 24 hours 03/14/15 0355  03/14/15 0359   03/14/15 0330  vancomycin (VANCOCIN) 1,500 mg in sodium chloride 0.9 % 500 mL IVPB     1,500 mg 250 mL/hr over 120 Minutes Intravenous STAT 03/14/15 0318 03/14/15 0549   03/14/15 0330  piperacillin-tazobactam (ZOSYN) IVPB 4.5 g  Status:  Discontinued     4.5 g 200 mL/hr over 30 Minutes Intravenous  Once 03/14/15 0318 03/14/15 0319   03/14/15 0330  piperacillin-tazobactam (ZOSYN) IVPB 3.375 g     3.375 g 12.5 mL/hr over 240 Minutes Intravenous STAT 03/14/15 0320 03/14/15 0431      Assessment: 57 yoM with relapsed multiple myeloma on salvage chemotherapy presents with left-sided chest wall pain with SOB and pancytopenia.  Started on empiric primaxin for febrile neutropenia. Plan per oncology is to continue primaxin until WBC >/= 1000.  Anti-infectives 8/13 >>Vanc x 1 8/13 >>Zosyn x 1 8/13 >> Primaxin >>   Famvir for HSV px  Vitals/Labs WBC: Slowly trending up, today 300 Tm24h: 98.8 SCr stable, CrCl >100  Cultures 8/13 blood x2: NGF 8/13 urine: NGF  Goal of Therapy:  Eradication of infection  Plan:  Continue primaxin 596m IV q6h F/u WBC, renal fxn, clinical course  CRalene Bathe PharmD, BCPS 03/20/2015, 10:49 AM  Pager: [phone]

## 2015-03-20 NOTE — Progress Notes (Signed)
[facility] PROGRESS NOTE  Mark [name] YJW:[id number] DOB: 06-18-1971 DOA: 03/14/2015 PCP: No PCP Per Patient  Assessment/Plan: Pancytopenia -Patient is continued on neutropenic precautions. Patient remains on empiricPrimaxin since admission with Oncology recommendations to continue until WBC reaches 1k. Blood cx negative. Patient is continued on famciclovir. -WBC remains stable at 0.3 today -Greatly appreciate guidance by Dr. Marin Olp. As patient has remained medically quite stable and is ambulating in the hallway with ease, will transfer care to Dr. Marin Olp. Have notified office. We will be more than happy to assist with medial issues should they arise.  DIC -Fibrinogen and d-dimer elevate and remain stable. Has off and on epistaxis. . APTT normal. Received FFP and PRBC on 8/13. Will continue to monitor.  Left-sided chest wall pain with shortness of breath -Chest x-ray unremarkable. CT angiogram of the chest one month ago was negative for PE. Pain is likely musculoskeletal. Continued with fentanyl patch and Lidoderm patch. Pain has Improved after increasing frequency of diluadid.  Refractory multiple myeloma -Recent chemotherapy with pancytopenia. Appreciate oncology follow-up. Urine IFE and light chain ordered. Continue to monitor.  Severe anemia and thrombocytopenia Transfused with irradiated platelets ( 1 u) and PRBC( 2 units) on 8/13.  Platelets 15k today. Cont to monitor with no signs of bleeding  Epistaxis Secondary to early DIC. Off and on. Hb stable.   Headache and dizziness Improved with IV fluids  Hypokalemia Replenished  DVT prophylaxis:SCD/ ambulate.  Code Status: Full Family Communication: Pt in room Disposition Plan: Anticipate d/c when WBC is >1k   Consultants:  Oncology  Procedures:    Antibiotics:  Vancomycin 8/12>>>  Primaxin 8/12>>>  HPI/Subjective: In good spirits. Denies sob or chest pain  Objective: Filed Vitals:   03/19/15  1540 03/19/15 2100 03/19/15 2117 03/20/15 0415  BP: 100/74  138/72 125/72  Pulse: 95 90 95 91  Temp: 98.8 F (37.1 C)  98.7 F (37.1 C) 98.8 F (37.1 C)  TempSrc: Oral  Oral Oral  Resp: '18  18 18  ' Height:      Weight:      SpO2: 98%  98% 98%    Intake/Output Summary (Last 24 hours) at 03/20/15 0950 Last data filed at 03/20/15 0341  Gross per 24 hour  Intake   2098 ml  Output      0 ml  Net   2098 ml   Filed Weights   03/14/15 0331 03/14/15 0511 03/17/15 1100  Weight: 81.647 kg (180 lb) 81.647 kg (180 lb) 82.101 kg (181 lb)    Exam:   General:  Awake, ambulating briskly in hallway, in nad  Cardiovascular: regular, s1, s2  Respiratory: normal resp effort, no wheezing  Abdomen: soft,nondistended, pos BS  Musculoskeletal: perfused, no clubbing   Data Reviewed: Basic Metabolic Panel:  Recent Labs Lab 03/16/15 0500 03/17/15 0145 03/18/15 0420 03/19/15 0535 03/20/15 0350  NA 142 143 144 141 143  K 3.5 3.8 4.1 4.2 4.4  CL 109 111 110 105 108  CO2 '27 25 27 27 25  ' GLUCOSE 104* 125* 97 105* 115*  BUN '13 14 13 10 14  ' CREATININE 0.80 0.91 0.80 0.84 0.89  CALCIUM 6.4* 6.9* 7.0* 7.0* 7.3*   Liver Function Tests:  Recent Labs Lab 03/16/15 0500 03/17/15 0145 03/18/15 0420 03/19/15 0535 03/20/15 0350  AST 17 16 14* 15 14*  ALT '20 18 18 ' 15* 16*  ALKPHOS 100 114 121 136* 144*  BILITOT 0.8 0.7 0.5 0.7 1.0  PROT 5.6* 5.6* 5.9* 6.0* 6.1*  ALBUMIN 2.9* 3.0* 3.2* 3.4* 3.5   No results for input(s): LIPASE, AMYLASE in the last 168 hours. No results for input(s): AMMONIA in the last 168 hours. CBC:  Recent Labs Lab 03/16/15 0500 03/17/15 0145 03/18/15 0420 03/19/15 0535 03/20/15 0350  WBC 0.1* 0.1* 0.2* 0.3* 0.3*  NEUTROABS  --  0.0* 0.1* 0.2* 0.1*  HGB 8.7* 8.8* 8.3* 8.5* 9.0*  HCT 25.2* 25.0* 24.4* 24.9* 25.8*  MCV 87.2 88.3 89.1 89.2 90.5  PLT 16*  16* 12* 34*  33* 20* 15*   Cardiac Enzymes:  Recent Labs Lab 03/14/15 0242 03/14/15 1015  03/14/15 1559  TROPONINI <0.03 <0.03 <0.03   BNP (last 3 results) No results for input(s): BNP in the last 8760 hours.  ProBNP (last 3 results) No results for input(s): PROBNP in the last 8760 hours.  CBG:  Recent Labs Lab 03/19/15 0738 03/19/15 1211 03/19/15 1725 03/19/15 2123 03/20/15 0735  GLUCAP 101* 101* 133* 165* 74    Recent Results (from the past 240 hour(s))  Blood Culture (routine x 2)     Status: None   Collection Time: 03/14/15  3:40 AM  Result Value Ref Range Status   Specimen Description BLOOD RIGHT ANTECUBITAL  Final   Special Requests BOTTLES DRAWN AEROBIC AND ANAEROBIC 5ML  Final   Culture   Final    NO GROWTH 5 DAYS Performed at [facility]    Report Status 03/19/2015 FINAL  Final  Blood Culture (routine x 2)     Status: None   Collection Time: 03/14/15  3:45 AM  Result Value Ref Range Status   Specimen Description BLOOD PORTA CATH  Final   Special Requests BOTTLES DRAWN AEROBIC AND ANAEROBIC 4ML  Final   Culture   Final    NO GROWTH 5 DAYS Performed at [facility]    Report Status 03/19/2015 FINAL  Final  Urine culture     Status: None   Collection Time: 03/14/15  3:50 AM  Result Value Ref Range Status   Specimen Description URINE, CLEAN CATCH  Final   Special Requests NONE  Final   Culture   Final    NO GROWTH 1 DAY Performed at [facility]    Report Status 03/15/2015 FINAL  Final     Studies: No results found.  Scheduled Meds: . antiseptic oral rinse  15 mL Mouth Rinse 6 X Daily  . famciclovir  500 mg Oral Daily  . fentaNYL  75 mcg Transdermal Q72H  . gabapentin  600 mg Oral TID  . imipenem-cilastatin  500 mg Intravenous Q6H  . insulin aspart  0-15 Units Subcutaneous TID WC  . insulin aspart  0-5 Units Subcutaneous QHS  . lidocaine  1 patch Transdermal Q24H  . pantoprazole  40 mg Oral Daily  . saccharomyces boulardii  250 mg Oral BID  . sodium bicarbonate/sodium chloride   Mouth Rinse Q6H   Continuous  Infusions:   Active Problems:   Multiple myeloma   Anemia   Thrombocytopenia   Febrile neutropenia   DIC (disseminated intravascular coagulation)   Antineoplastic chemotherapy induced pancytopenia   Neutropenia   Blood poisoning   [facility], [place] [place] [facility] Pager [phone]. If 7PM-7AM, please contact night-coverage at [email or web address], password [facility] 03/20/2015, 9:50 AM  LOS: 6 days

## 2015-03-20 NOTE — Progress Notes (Signed)
Chaplain spoke with patient as he walked the hallway with his IV pole. Chaplain provided a presence as well as support. Patient spoke with chaplain about " Not looking like what he had gone through."   03/20/15 1600  Clinical Encounter Type  Visited With Patient  Visit Type Follow-up;Spiritual support;Social support  Referral From Nurse  Consult/Referral To Chaplain   Chaplain encourage patient to keep looking up. Chaplain services available upon request.

## 2015-03-21 DIAGNOSIS — D61810 Antineoplastic chemotherapy induced pancytopenia: Secondary | ICD-10-CM

## 2015-03-21 DIAGNOSIS — D6959 Other secondary thrombocytopenia: Secondary | ICD-10-CM

## 2015-03-21 DIAGNOSIS — D701 Agranulocytosis secondary to cancer chemotherapy: Principal | ICD-10-CM

## 2015-03-21 LAB — GLUCOSE, CAPILLARY
GLUCOSE-CAPILLARY: 106 mg/dL — AB (ref 65–99)
GLUCOSE-CAPILLARY: 117 mg/dL — AB (ref 65–99)
GLUCOSE-CAPILLARY: 134 mg/dL — AB (ref 65–99)
GLUCOSE-CAPILLARY: 144 mg/dL — AB (ref 65–99)
GLUCOSE-CAPILLARY: 96 mg/dL (ref 65–99)

## 2015-03-21 LAB — COMPREHENSIVE METABOLIC PANEL
ALBUMIN: 3.5 g/dL (ref 3.5–5.0)
ALT: 14 U/L — AB (ref 17–63)
AST: 13 U/L — AB (ref 15–41)
Alkaline Phosphatase: 140 U/L — ABNORMAL HIGH (ref 38–126)
Anion gap: 6 (ref 5–15)
BUN: 14 mg/dL (ref 6–20)
CHLORIDE: 109 mmol/L (ref 101–111)
CO2: 27 mmol/L (ref 22–32)
Calcium: 7.4 mg/dL — ABNORMAL LOW (ref 8.9–10.3)
Creatinine, Ser: 0.96 mg/dL (ref 0.61–1.24)
GFR calc Af Amer: >60 mL/min (ref >60)
GFR calc non Af Amer: >60 mL/min (ref >60)
GLUCOSE: 109 mg/dL — AB (ref 65–99)
POTASSIUM: 4.4 mmol/L (ref 3.5–5.1)
Sodium: 142 mmol/L (ref 135–145)
Total Bilirubin: 0.6 mg/dL (ref 0.3–1.2)
Total Protein: 6.2 g/dL — ABNORMAL LOW (ref 6.5–8.1)

## 2015-03-21 LAB — CBC WITH DIFFERENTIAL/PLATELET
BASOS ABS: 0 10*3/uL (ref 0.0–0.1)
Basophils Relative: 0 % (ref 0–1)
Eosinophils Absolute: 0 10*3/uL (ref 0.0–0.7)
Eosinophils Relative: 0 % (ref 0–5)
HEMATOCRIT: 25.9 % — AB (ref 39.0–52.0)
Hemoglobin: 8.5 g/dL — ABNORMAL LOW (ref 13.0–17.0)
LYMPHS ABS: 0.1 10*3/uL — AB (ref 0.7–4.0)
Lymphocytes Relative: 32 % (ref 12–46)
MCH: 29.7 pg (ref 26.0–34.0)
MCHC: 32.8 g/dL (ref 30.0–36.0)
MCV: 90.6 fL (ref 78.0–100.0)
MONO ABS: 0 10*3/uL — AB (ref 0.1–1.0)
MONOS PCT: 11 % (ref 3–12)
NEUTROS PCT: 57 % (ref 43–77)
Neutro Abs: 0.3 10*3/uL — ABNORMAL LOW (ref 1.7–7.7)
PLATELETS: 9 10*3/uL — AB (ref 150–400)
RBC: 2.86 MIL/uL — AB (ref 4.22–5.81)
RDW: 14 % (ref 11.5–15.5)
WBC: 0.4 10*3/uL — AB (ref 4.0–10.5)

## 2015-03-21 MED ORDER — HEPARIN SOD (PORK) LOCK FLUSH 100 UNIT/ML IV SOLN: 500.0000 [IU] | Freq: Every day | INTRAVENOUS | Status: DC | PRN

## 2015-03-21 MED ORDER — SODIUM CHLORIDE 0.9 % IJ SOLN: 3.0000 mL | INTRAMUSCULAR | Status: DC | PRN

## 2015-03-21 MED ORDER — SODIUM CHLORIDE 0.9 % IV SOLN
250.0000 mL | Freq: Once | INTRAVENOUS | Status: AC
  Administered 2015-03-21: 250 mL via INTRAVENOUS

## 2015-03-21 MED ORDER — SODIUM CHLORIDE 0.9 % IJ SOLN: 10.0000 mL | INTRAMUSCULAR | Status: DC | PRN

## 2015-03-21 NOTE — Progress Notes (Signed)
HEMATOLOGY/ONCOLOGY INPATIENT PROGRESS NOTE  Date of Service: 03/21/2015  Inpatient Attending: .[name], MD   SUBJECTIVE  [name] notes that he is feeling about the same as yesterday. He is available that his platelet counts are lower at 9000. He notes no acute new bleeding. Previously had epistaxis upon admission. Note some element of fatigue. Continues to be on imipenem. No overt fevers. ANC 0.3 today.   OBJECTIVE:   PHYSICAL EXAMINATION: . Filed Vitals:   03/21/15 0500 03/21/15 1400 03/21/15 1500 03/21/15 1635  BP: 120/68 122/68 112/78 122/76  Pulse: 90 88 99 88  Temp: 98.2 F (36.8 C) 98 F (36.7 C) 98.7 F (37.1 C) 98.6 F (37 C)  TempSrc: Oral Oral Oral Oral  Resp: '18 16 18 18  ' Height:      Weight:      SpO2: 99% 100% 98% 99%   Filed Weights   03/14/15 0331 03/14/15 0511 03/17/15 1100  Weight: 180 lb (81.647 kg) 180 lb (81.647 kg) 181 lb (82.101 kg)   .Body mass index is 25.26 kg/(m^2).  GENERAL:alert, in no acute distress and comfortable In bed SKIN: skin color, texture, turgor are normal, no rashes or significant lesions, no ecchymosis or petechiae EYES: normal, mild conjunctival pallor. Anicteric sclera. OROPHARYNX:no exudate, no erythema and lips, buccal mucosa, and tongue normal  NECK: supple, no JVD, thyroid normal size, non-tender, without nodularity LYMPH:  no palpable lymphadenopathy in the cervical, axillary or inguinal LUNGS: clear to auscultation with normal respiratory effort HEART: regular rate & rhythm,  no murmurs and no lower extremity edema ABDOMEN: abdomen soft, non-tender, normoactive bowel sounds  Musculoskeletal: no cyanosis of digits and no clubbing  PSYCH: alert & oriented x 3 with fluent speech NEURO: no focal motor/sensory deficits  MEDICAL HISTORY:  Past Medical History  Diagnosis Date  . History of radiation therapy 02/07/13- 02/26/13    lower L spine, upper sacrum, 35 gray in 14 fractions  . FH: chemotherapy     Dr.  Burney Gauze  . Cancer   . Multiple myeloma dx'd 01/2013  . GERD (gastroesophageal reflux disease)     SURGICAL HISTORY: Past Surgical History  Procedure Laterality Date  . Portacath placement    . 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 Kyphoplasty  05/20/14  . Lumbar laminectomy/decompression microdiscectomy N/A 09/25/2014    Procedure: LUMBAR LAMINECTOMY/DECOMPRESSION MICRODISCECTOMY;  Surgeon: Sinclair Ship, MD;  Location: [facility];  Service: Orthopedics;  Laterality: N/A;  Lumbar 4-5, L5-S1  decompression  . Esophagogastroduodenoscopy N/A 02/06/2015    Procedure: ESOPHAGOGASTRODUODENOSCOPY (EGD);  Surgeon: Teena Irani, MD;  Location: [facility];  Service: Endoscopy;  Laterality: N/A;    SOCIAL HISTORY: Social History   Social History  . Marital Status: Married    Spouse Name: N/A  . Number of Children: N/A  . Years of Education: N/A   Occupational History  . Not on file.   Social History Main Topics  . Smoking status: Former Smoker -- 1.00 packs/day for 15 years    Types: Cigarettes    Start date: 06/29/1988    Quit date: 06/29/2003  . Smokeless tobacco: Never Used     Comment: quit smoking 10 years ago  . Alcohol Use: No  . Drug Use: No  . Sexual Activity: No   Other Topics Concern  . Not on file   Social History Narrative    FAMILY HISTORY: Family History  Problem Relation Age of Onset  .  Diabetic kidney disease Mother   . Hypertension Mother   . Heart attack Mother   . Kidney failure Mother   . Coronary artery disease Mother   . HIV Father     ALLERGIES:  is allergic to gadolinium derivatives.  MEDICATIONS:  Scheduled Meds: . antiseptic oral rinse  15 mL Mouth Rinse 6 X Daily  . famciclovir  500 mg Oral Daily  . fentaNYL  75 mcg Transdermal Q72H  . gabapentin  600 mg Oral TID  . imipenem-cilastatin  500 mg Intravenous Q6H  . insulin aspart  0-15 Units Subcutaneous TID WC  . insulin aspart  0-5 Units Subcutaneous QHS  .  lidocaine  1 patch Transdermal Q24H  . pantoprazole  40 mg Oral Daily  . saccharomyces boulardii  250 mg Oral BID  . sodium bicarbonate/sodium chloride   Mouth Rinse Q6H   Continuous Infusions:  PRN Meds:.acetaminophen, bisacodyl, heparin lock flush, HYDROmorphone, LORazepam, metoCLOPramide, ondansetron, oxymetazoline, prochlorperazine, sodium chloride, sodium chloride, sodium chloride, temazepam  REVIEW OF SYSTEMS:    10 Point review of Systems was done is negative except as noted above.   LABORATORY DATA:  I have reviewed the data as listed  . CBC Latest Ref Rng 03/21/2015 03/20/2015 03/19/2015  WBC 4.0 - 10.5 K/uL 0.4(LL) 0.3(LL) 0.3(LL)  Hemoglobin 13.0 - 17.0 g/dL 8.5(L) 9.0(L) 8.5(L)  Hematocrit 39.0 - 52.0 % 25.9(L) 25.8(L) 24.9(L)  Platelets 150 - 400 K/uL 9(LL) 15(LL) 20(LL)    . CMP Latest Ref Rng 03/21/2015 03/20/2015 03/19/2015  Glucose 65 - 99 mg/dL 109(H) 115(H) 105(H)  BUN 6 - 20 mg/dL '14 14 10  ' Creatinine 0.61 - 1.24 mg/dL 0.96 0.89 0.84  Sodium 135 - 145 mmol/L 142 143 141  Potassium 3.5 - 5.1 mmol/L 4.4 4.4 4.2  Chloride 101 - 111 mmol/L 109 108 105  CO2 22 - 32 mmol/L '27 25 27  ' Calcium 8.9 - 10.3 mg/dL 7.4(L) 7.3(L) 7.0(L)  Total Protein 6.5 - 8.1 g/dL 6.2(L) 6.1(L) 6.0(L)  Albumin 3.3 - 5.5 g/dL - - -  Total Bilirubin 0.3 - 1.2 mg/dL 0.6 1.0 0.7  Alkaline Phos 38 - 126 U/L 140(H) 144(H) 136(H)  AST 15 - 41 U/L 13(L) 14(L) 15  ALT 17 - 63 U/L 14(L) 16(L) 15(L)     RADIOGRAPHIC STUDIES: I have personally reviewed the radiological images as listed and agreed with the findings in the report. Dg Chest 2 View  03/14/2015   CLINICAL DATA:  Shortness breath.  Chest pain.  Multiple myeloma.  EXAM: CHEST - 2 VIEW  COMPARISON:  Two-view chest x-ray 03/06/2015  FINDINGS: Heart size is normal. A right IJ Port-A-Cath is stable. The lung volumes are somewhat low. No focal airspace disease is present. There is no edema or effusion to suggest failure. The visualized soft  tissues are within normal limits. Focal kyphosis in the lower thoracic spine is stable.  IMPRESSION: 1. No acute cardiopulmonary disease or significant interval change. 2. Persistent low lung volumes. 3. Stable right IJ Port-A-Cath.   Electronically Signed   By: San Morelle M.D.   On: 03/14/2015 02:46   Dg Chest 2 View  03/06/2015   CLINICAL DATA:  Pneumonia.  EXAM: CHEST  2 VIEW  COMPARISON:  03/03/2015, 03/02/2015.  CT 02/27/2015.  FINDINGS: Power port catheter noted in good anatomic position. Left PICC line noted with tip projected over cavoatrial junction. Mediastinum hilar structures are normal. Heart size normal. Interim near complete clearing of right upper lobe infiltrate. Left pleural base  mass again noted.  IMPRESSION: 1. Power port catheter and PICC line in good anatomic position . 2. Interim near complete clearing of right upper lobe infiltrate. Persistent left lung pleural based mass again noted.   Electronically Signed   By: Marcello Moores  Register   On: 03/06/2015 09:45   Dg Chest 2 View  03/02/2015   CLINICAL DATA:  Pneumonia.  EXAM: CHEST  2 VIEW  COMPARISON:  02/27/2015.  CT 02/27/2015 .  PET-CT 02/26/2015 .  FINDINGS: PowerPort catheter in stable position. Mediastinum hilar structures stable. Heart size normal. Persistent right upper lobe infiltrate . Persistent left anterior fourth rib soft tissue lesion. No pleural effusion or pneumothorax. Bony lytic lesions consistent with myeloma best demonstrated by prior CT.  IMPRESSION: 1. Power port catheter in stable position. 2. Persistent right upper lobe infiltrate. 3. Persistent left anterior fourth rib soft tissue mass. 4. Bony lytic lesions consistent with myeloma best demonstrated by prior CT .   Electronically Signed   By: Marcello Moores  Register   On: 03/02/2015 08:18   Dg Chest 2 View  02/27/2015   CLINICAL DATA:  Multiple myeloma. On chemotherapy. Cough and hemoptysis.  EXAM: CHEST  2 VIEW  COMPARISON:  02/09/2015 chest radiograph.   02/26/2015 PET-CT.  FINDINGS: Right internal jugular MediPort terminates at the cavoatrial junction. Stable cardiomediastinal silhouette with normal heart size. No pneumothorax. No pleural effusion. New patchy focal consolidation in the mid to upper right lung, likely in the superior segment of the right lower lobe. Low lung volumes with mild bibasilar atelectasis. Stable mild compression deformities of two lower thoracic vertebral bodies.  IMPRESSION: New patchy focal consolidation in the mid to upper right lung, likely in the superior segment of the right lower lobe. Differential includes pneumonia or alveolar hemorrhage.   Electronically Signed   By: Ilona Sorrel M.D.   On: 02/27/2015 17:17   Ct Chest Wo Contrast  02/27/2015   CLINICAL DATA:  Multiple episodes hemoptysis today.  EXAM: CT CHEST WITHOUT CONTRAST  TECHNIQUE: Multidetector CT imaging of the chest was performed following the standard protocol without IV contrast.  COMPARISON:  Radiographs 02/27/2015, CT 02/09/2015, PET 02/26/2015  FINDINGS: There is alveolar opacity in the posterior segment of the right upper lobe with air bronchograms. This could represent infectious infiltrate or pulmonary hemorrhage. This corresponds to the radiographic abnormality observed earlier. The area involved measures approximately 5 cm in diameter. The airways serving this lung are widely patent. Minimal patchy alveolar opacity is present in the left upper lobe laterally, potentially another area of pulmonary hemorrhage or infectious infiltrate. This is a much smaller focus of abnormality. Both of these abnormalities are new from the PET/CT scan obtained on 02/26/2015. There is unchanged pleural parenchymal linear densities which likely represent scarring, lower lobe predominant. No significant adenopathy is evident in the mediastinum or hila. There is nonspecific mildly prominent soft tissue in the upper mediastinum between the origins of the left subclavian and left  common carotid arteries, unchanged.  There are no effusions.  There are innumerable skeletal lesions consistent with myeloma. There is a soft tissue mass surrounding the anterior portion of the left fourth rib and this is slightly larger than on 02/09/2015, measuring 2.4 x 4.9 cm and previously measuring 2.3 x 4.2 cm.  IMPRESSION: 1. The radiographic abnormality corresponds to a 5 cm area of alveolar opacity with air bronchograms in the posterior segment of the right upper lobe. This could represent infectious infiltrate or pulmonary hemorrhage. It is new from yesterday's  PET-CT scan. There is a smaller area of patchy alveolar opacity in the left upper lobe laterally, also new, potentially representing another focus of the same acute process. 2. Soft tissue mass surrounding the anterior left fourth rib has slightly enlarged from 02/09/2015. 3. Innumerable lytic lesions throughout the skeleton consistent with myeloma.   Electronically Signed   By: Andreas Newport M.D.   On: 02/27/2015 22:58   Nm Pet Image Restage (ps) Whole Body  02/26/2015   CLINICAL DATA:  Subsequent treatment strategy for multiple myeloma.  EXAM: NUCLEAR MEDICINE PET WHOLE BODY  TECHNIQUE: 9.8 mCi F-18 FDG was injected intravenously. Full-ring PET imaging was performed from the vertex to the feet after the radiotracer. CT data was obtained and used for attenuation correction and anatomic localization.  FASTING BLOOD GLUCOSE:  Value:  186 mg/dl  COMPARISON:  Chest CT 02/09/2015, head CT 02/04/2015, MR pelvis 12/10/2014  FINDINGS: Head/Neck: No hypermetabolic lymph nodes in the neck. There is a focus of asymmetric metabolic activity in the central LEFT temporal cortex (image 16). There is no corresponding lesion on the contrast CT of 02/04/2015 therefore is likely a benign cortical asymmetric activity.  Chest: No hypermetabolic mediastinal or hilar nodes. No suspicious pulmonary nodules on the CT scan.  Abdomen/Pelvis: No abnormal  hypermetabolic activity within the liver, pancreas, adrenal glands, or spleen. No hypermetabolic lymph nodes in the abdomen or pelvis.  Skeleton: There is a hypermetabolic soft tissue lesions associated with the LEFT anterior fourth rib. The soft tissue component measures 2.9 by 4.5 cm with SUV max 7.6.  There is several hypermetabolic foci associated with the LEFT iliac bone. There is a lytic lesion with cortical destruction noted in the iliac crest posteriorly on image 190, series 4 with SUV max equal 8.3.  There is focus of hypermetabolic activity within the marrow space of the RIGHT femoral diaphysis with a subtle soft tissue lesion seen within the medullary space on image 272, series 4. The activity is intense at this site with SUV max equal 8.7.  There are multiple additional lytic lesions throughout the spine without associated metabolic activity.  Extremities: No hypermetabolic activity to suggest metastasis.  IMPRESSION: 1. Several foci hypermetabolic activity within the skeleton consistent with active myeloma lesions. Hypermetabolic lesions include; soft tissue lesion within the LEFT anterior fifth rib, lytic lesion within the LEFT iliac crest, and intra medullary lesion in the mid RIGHT femur. 2. Multiple additional lytic lesions within the spine and pelvis do not have associated metabolic activity. 3. Asymmetric metabolic activity within the LEFT temporal region without corresponding lesion on comparison contrast head CT of 02/04/2015 is likely benign.   Electronically Signed   By: Suzy Bouchard M.D.   On: 02/26/2015 11:57   Dg Chest Port 1 View  03/03/2015   CLINICAL DATA:  Hypoxia, dyspnea  EXAM: PORTABLE CHEST - 1 VIEW  COMPARISON:  03/02/2015  FINDINGS: Right upper lobe infiltrate appears slightly improved.  No effusion  Mild bibasilar atelectasis related to hypoventilation and decreased lung volume. No effusion. Port-A-Cath tip in the upper right atrium unchanged. Vascularity normal.   IMPRESSION: Mild improvement right upper lobe infiltrate consistent with pneumonia  Hypoventilation with increase in bibasilar atelectasis.   Electronically Signed   By: Franchot Gallo M.D.   On: 03/03/2015 11:23    ASSESSMENT & PLAN:   44 year old African-American male a patient of Dr. Marin Olp admitted with pancytopenia and thought to have febrile neutropenia  1) febrile neutropenia - patient continues to be neutropenic ANC  0.3. Neutropenia appears to likely be related to chemotherapy. He was on  Daratumumab with Velcade and Decadron then received VD-PACE from 03/04/2015 to 03/08/2015 followed by Neulasta shot as per patient. Daratumumab is known to cause prolonged cytopenias that can last up to 3 weeks given its longer half-life. No overt positive cultures at this time. Plan -continue empiric imipenem -Repeat blood cultures if febrile -Will discontinue his probiotics due to small but real risk of bacteremia/fungemia from them in the setting of significant neutropenia.  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 I do not have data from his myeloma cytogenetics. Per Dr Marin Olp he is on Daratumumab with Velcade and Decadron and received  VD-PACE  on 8/3 to 8/4. Plan -Continue shingles prophylaxis -All myeloma treatment on hold while pancytopenic. -Further management per Dr. Marin Olp.given the grade 3-4 hematologic toxicity will need careful rethinking of therapeutic plan.  3) thrombocytopenia : likely related to chemotherapy. Could additionally be due to infection or antibiotics. Plan -Transfuse 1 a pheresis unit of irradiated platelets today prophylactically with the plan to transfuse when necessary additionally to maintain platelets more than 10,000 -if concerns with bleeding might need to keep the platelet count above 20,000  I  spent 25 minutes counseling the patient face to face. The total time spent in the appointment was 35 minutes and more than 50% was on counseling and direct patient cares.    Sullivan Lone MD [place] AAHIVMS [facility]emat[facility]On[facility]  (Office):       [phone] (Work cell):  [phone] (Fax):           [phone]  03/21/2015 5:16 PM

## 2015-03-22 LAB — DIC (DISSEMINATED INTRAVASCULAR COAGULATION)PANEL
Platelets: 38 10*3/uL — ABNORMAL LOW (ref 150–400)
Smear Review: NONE SEEN
aPTT: 27 seconds (ref 24–37)

## 2015-03-22 LAB — COMPREHENSIVE METABOLIC PANEL
ALT: 16 U/L — ABNORMAL LOW (ref 17–63)
ANION GAP: 6 (ref 5–15)
AST: 16 U/L (ref 15–41)
Albumin: 3.6 g/dL (ref 3.5–5.0)
Alkaline Phosphatase: 160 U/L — ABNORMAL HIGH (ref 38–126)
BUN: 11 mg/dL (ref 6–20)
CHLORIDE: 108 mmol/L (ref 101–111)
CO2: 28 mmol/L (ref 22–32)
Calcium: 7.7 mg/dL — ABNORMAL LOW (ref 8.9–10.3)
Creatinine, Ser: 0.79 mg/dL (ref 0.61–1.24)
Glucose, Bld: 101 mg/dL — ABNORMAL HIGH (ref 65–99)
POTASSIUM: 4.6 mmol/L (ref 3.5–5.1)
SODIUM: 142 mmol/L (ref 135–145)
Total Bilirubin: 0.6 mg/dL (ref 0.3–1.2)
Total Protein: 6.5 g/dL (ref 6.5–8.1)

## 2015-03-22 LAB — CBC WITH DIFFERENTIAL/PLATELET
BASOS PCT: 0 % (ref 0–1)
Basophils Absolute: 0 10*3/uL (ref 0.0–0.1)
EOS ABS: 0 10*3/uL (ref 0.0–0.7)
Eosinophils Relative: 0 % (ref 0–5)
HCT: 25.4 % — ABNORMAL LOW (ref 39.0–52.0)
Hemoglobin: 8.3 g/dL — ABNORMAL LOW (ref 13.0–17.0)
LYMPHS ABS: 0.1 10*3/uL — AB (ref 0.7–4.0)
Lymphocytes Relative: 26 % (ref 12–46)
MCH: 29.6 pg (ref 26.0–34.0)
MCHC: 32.7 g/dL (ref 30.0–36.0)
MCV: 90.7 fL (ref 78.0–100.0)
MONO ABS: 0 10*3/uL — AB (ref 0.1–1.0)
Monocytes Relative: 9 % (ref 3–12)
NEUTROS PCT: 65 % (ref 43–77)
Neutro Abs: 0.3 10*3/uL — ABNORMAL LOW (ref 1.7–7.7)
PLATELETS: 38 10*3/uL — AB (ref 150–400)
RBC: 2.8 MIL/uL — ABNORMAL LOW (ref 4.22–5.81)
RDW: 13.8 % (ref 11.5–15.5)
WBC: 0.4 10*3/uL — CL (ref 4.0–10.5)

## 2015-03-22 LAB — DIC (DISSEMINATED INTRAVASCULAR COAGULATION) PANEL
D DIMER QUANT: 0.48 ug{FEU}/mL (ref 0.00–0.48)
FIBRINOGEN: 549 mg/dL — AB (ref 204–475)
INR: 0.93 (ref 0.00–1.49)
PROTHROMBIN TIME: 12.7 s (ref 11.6–15.2)

## 2015-03-22 LAB — GLUCOSE, CAPILLARY
GLUCOSE-CAPILLARY: 87 mg/dL (ref 65–99)
GLUCOSE-CAPILLARY: 116 mg/dL — AB (ref 65–99)
Glucose-Capillary: 155 mg/dL — ABNORMAL HIGH (ref 65–99)
Glucose-Capillary: 132 mg/dL — ABNORMAL HIGH (ref 65–99)

## 2015-03-22 LAB — SAVE SMEAR

## 2015-03-22 LAB — PREPARE PLATELET PHERESIS: UNIT DIVISION: 0

## 2015-03-22 NOTE — Progress Notes (Signed)
 HEMATOLOGY/ONCOLOGY INPATIENT PROGRESS NOTE  Date of Service: 03/22/2015  Inpatient Attending: .Peter R Ennever, MD   SUBJECTIVE  [name] was seen in follow-up this morning. Notes no fevers or chills overnight. Tolerated platelet transfusion without any problem. Platelet counts are up to 38,000. No issues with bleeding. Still neutropenic. Continues to be on antibiotics.   OBJECTIVE:   PHYSICAL EXAMINATION: . Filed Vitals:   03/21/15 1635 03/21/15 1830 03/21/15 2036 03/22/15 0520  BP: 122/76 130/78 132/72 128/82  Pulse: 88 100 96 90  Temp: 98.6 F (37 C) 98.6 F (37 C) 99 F (37.2 C) 98.6 F (37 C)  TempSrc: Oral Oral Oral Oral  Resp: 18 20 20 20  Height:      Weight:      SpO2: 99% 100% 96% 100%   Filed Weights   03/14/15 0331 03/14/15 0511 03/17/15 1100  Weight: 180 lb (81.647 kg) 180 lb (81.647 kg) 181 lb (82.101 kg)   .Body mass index is 25.26 kg/(m^2).  GENERAL:alert, in no acute distress and comfortable In bed SKIN: skin color, texture, turgor are normal, no rashes or significant lesions, no ecchymosis or petechiae EYES: normal, mild conjunctival pallor. Anicteric sclera. OROPHARYNX:no exudate, no erythema and lips, buccal mucosa, and tongue normal  NECK: supple, no JVD, thyroid normal size, non-tender, without nodularity LYMPH:  no palpable lymphadenopathy in the cervical, axillary or inguinal LUNGS: clear to auscultation with normal respiratory effort HEART: regular rate & rhythm,  no murmurs and no lower extremity edema ABDOMEN: abdomen soft, non-tender, normoactive bowel sounds  Musculoskeletal: no cyanosis of digits and no clubbing  PSYCH: alert & oriented x 3 with fluent speech NEURO: no focal motor/sensory deficits  MEDICAL HISTORY:  Past Medical History  Diagnosis Date  . History of radiation therapy 02/07/13- 02/26/13    lower L spine, upper sacrum, 35 gray in 14 fractions  . FH: chemotherapy     Dr. Peter Ennever  . Cancer   . Multiple  myeloma dx'd 01/2013  . GERD (gastroesophageal reflux disease)     SURGICAL HISTORY: Past Surgical History  Procedure Laterality Date  . Portacath placement    . 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 Kyphoplasty  05/20/14  . Lumbar laminectomy/decompression microdiscectomy N/A 09/25/2014    Procedure: LUMBAR LAMINECTOMY/DECOMPRESSION MICRODISCECTOMY;  Surgeon: Mark Leonard Dumonski, MD;  Location: [facility] OR;  Service: Orthopedics;  Laterality: N/A;  Lumbar 4-5, L5-S1  decompression  . Esophagogastroduodenoscopy N/A 02/06/2015    Procedure: ESOPHAGOGASTRODUODENOSCOPY (EGD);  Surgeon: John Hayes, MD;  Location: [facility]COPY;  Service: Endoscopy;  Laterality: N/A;    SOCIAL HISTORY: Social History   Social History  . Marital Status: Married    Spouse Name: N/A  . Number of Children: N/A  . Years of Education: N/A   Occupational History  . Not on file.   Social History Main Topics  . Smoking status: Former Smoker -- 1.00 packs/day for 15 years    Types: Cigarettes    Start date: 06/29/1988    Quit date: 06/29/2003  . Smokeless tobacco: Never Used     Comment: quit smoking 10 years ago  . Alcohol Use: No  . Drug Use: No  . Sexual Activity: No   Other Topics Concern  . Not on file   Social History Narrative    FAMILY HISTORY: Family History  Problem Relation Age of Onset  . Diabetic kidney disease Mother   . Hypertension Mother   . Heart   attack Mother   . Kidney failure Mother   . Coronary artery disease Mother   . HIV Father     ALLERGIES:  is allergic to gadolinium derivatives.  MEDICATIONS:  Scheduled Meds: . antiseptic oral rinse  15 mL Mouth Rinse 6 X Daily  . famciclovir  500 mg Oral Daily  . fentaNYL  75 mcg Transdermal Q72H  . gabapentin  600 mg Oral TID  . imipenem-cilastatin  500 mg Intravenous Q6H  . insulin aspart  0-15 Units Subcutaneous TID WC  . insulin aspart  0-5 Units Subcutaneous QHS  . lidocaine  1 patch Transdermal Q24H  .  pantoprazole  40 mg Oral Daily  . sodium bicarbonate/sodium chloride   Mouth Rinse Q6H   Continuous Infusions:  PRN Meds:.acetaminophen, bisacodyl, heparin lock flush, HYDROmorphone, LORazepam, metoCLOPramide, ondansetron, oxymetazoline, prochlorperazine, sodium chloride, sodium chloride, sodium chloride, temazepam  REVIEW OF SYSTEMS:    10 Point review of Systems was done is negative except as noted above.   LABORATORY DATA:  I have reviewed the data as listed  . CBC Latest Ref Rng 03/22/2015 03/22/2015 03/21/2015  WBC 4.0 - 10.5 K/uL - 0.4(LL) 0.4(LL)  Hemoglobin 13.0 - 17.0 g/dL - 8.3(L) 8.5(L)  Hematocrit 39.0 - 52.0 % - 25.4(L) 25.9(L)  Platelets 150 - 400 K/uL 38(L) 38(L) 9(LL)    . CMP Latest Ref Rng 03/22/2015 03/21/2015 03/20/2015  Glucose 65 - 99 mg/dL 101(H) 109(H) 115(H)  BUN 6 - 20 mg/dL 11 14 14  Creatinine 0.61 - 1.24 mg/dL 0.79 0.96 0.89  Sodium 135 - 145 mmol/L 142 142 143  Potassium 3.5 - 5.1 mmol/L 4.6 4.4 4.4  Chloride 101 - 111 mmol/L 108 109 108  CO2 22 - 32 mmol/L 28 27 25  Calcium 8.9 - 10.3 mg/dL 7.7(L) 7.4(L) 7.3(L)  Total Protein 6.5 - 8.1 g/dL 6.5 6.2(L) 6.1(L)  Albumin 3.3 - 5.5 g/dL - - -  Total Bilirubin 0.3 - 1.2 mg/dL 0.6 0.6 1.0  Alkaline Phos 38 - 126 U/L 160(H) 140(H) 144(H)  AST 15 - 41 U/L 16 13(L) 14(L)  ALT 17 - 63 U/L 16(L) 14(L) 16(L)   ASSESSMENT & PLAN:   43-year-old African-American male, a patient of Dr. Ennever,  admitted with pancytopenia and thought to have febrile neutropenia  1) Febrile neutropenia - patient continues to be neutropenic ANC 0.3. Neutropenia appears to likely be related to chemotherapy. He was on  Daratumumab with Velcade and Decadron then received VD-PACE from 03/04/2015 to 03/08/2015 followed by Neulasta shot as per patient. Daratumumab is known to cause prolonged cytopenias that can last up to 3 weeks given its longer half-life. No overt positive cultures at this time. Plan -continue empiric imipenem -Repeat  blood cultures if febrile -Will discontinue his probiotics due to small but real risk of bacteremia/fungemia from them in the setting of significant neutropenia.  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 I do not have data from his myeloma cytogenetics. Per Dr Ennever he is on Daratumumab with Velcade and Decadron and received  VD-PACE  on 8/3 to 8/4. Plan -Continue shingles prophylaxis -All myeloma treatment on hold while pancytopenic. -Further management per Dr. Ennever.given the grade 3-4 hematologic toxicity will need careful rethinking of therapeutic plan.  3) thrombocytopenia : likely related to chemotherapy. Could additionally be due to infection or antibiotics. Platelets improved to 30,000 following platelet   transfusion  Plan -Transfuse 1 apheresis unit of irradiated platelets when necessary additionally to maintain platelets more than 10,000 -if concerns with bleeding might need to keep the platelet count above 20,000  Dr. Ennever will continue to follow the patient from tomorrow .   I spent 25 minutes counseling the patient face to face. The total time spent in the appointment was 35 minutes and more than 50% was on counseling and direct patient cares.    Gautam Kale MD MS AAHIVMS SCH CTH Hematology/Oncology Physician [facility]  (Office):       [phone] (Work cell):  [phone] (Fax):           [phone]  03/22/2015 1:38 PM   

## 2015-03-23 LAB — COMPREHENSIVE METABOLIC PANEL
ALK PHOS: 155 U/L — AB (ref 38–126)
ALT: 14 U/L — ABNORMAL LOW (ref 17–63)
ANION GAP: 6 (ref 5–15)
AST: 15 U/L (ref 15–41)
Albumin: 3.5 g/dL (ref 3.5–5.0)
BILIRUBIN TOTAL: 0.7 mg/dL (ref 0.3–1.2)
BUN: 12 mg/dL (ref 6–20)
CALCIUM: 7.7 mg/dL — AB (ref 8.9–10.3)
CO2: 28 mmol/L (ref 22–32)
Chloride: 109 mmol/L (ref 101–111)
Creatinine, Ser: 0.77 mg/dL (ref 0.61–1.24)
GFR calc non Af Amer: >60 mL/min (ref >60)
Glucose, Bld: 98 mg/dL (ref 65–99)
POTASSIUM: 4.8 mmol/L (ref 3.5–5.1)
Sodium: 143 mmol/L (ref 135–145)
TOTAL PROTEIN: 6.1 g/dL — AB (ref 6.5–8.1)

## 2015-03-23 LAB — GLUCOSE, CAPILLARY
GLUCOSE-CAPILLARY: 88 mg/dL (ref 65–99)
GLUCOSE-CAPILLARY: 108 mg/dL — AB (ref 65–99)
Glucose-Capillary: 118 mg/dL — ABNORMAL HIGH (ref 65–99)
Glucose-Capillary: 123 mg/dL — ABNORMAL HIGH (ref 65–99)

## 2015-03-23 LAB — CBC WITH DIFFERENTIAL/PLATELET
BASOS ABS: 0 10*3/uL (ref 0.0–0.1)
BASOS PCT: 0 % (ref 0–1)
EOS ABS: 0 10*3/uL (ref 0.0–0.7)
Eosinophils Relative: 0 % (ref 0–5)
HEMATOCRIT: 25.4 % — AB (ref 39.0–52.0)
HEMOGLOBIN: 8.4 g/dL — AB (ref 13.0–17.0)
LYMPHS ABS: 0.1 10*3/uL — AB (ref 0.7–4.0)
LYMPHS PCT: 15 % (ref 12–46)
MCH: 30.4 pg (ref 26.0–34.0)
MCHC: 33.1 g/dL (ref 30.0–36.0)
MCV: 92 fL (ref 78.0–100.0)
MONOS PCT: 19 % — AB (ref 3–12)
Monocytes Absolute: 0.1 10*3/uL (ref 0.1–1.0)
NEUTROS ABS: 0.3 10*3/uL — AB (ref 1.7–7.7)
Neutrophils Relative %: 66 % (ref 43–77)
Platelets: 23 10*3/uL — CL (ref 150–400)
RBC: 2.76 MIL/uL — ABNORMAL LOW (ref 4.22–5.81)
RDW: 13.9 % (ref 11.5–15.5)
WBC: 0.5 10*3/uL — CL (ref 4.0–10.5)

## 2015-03-23 MED ORDER — HYDROMORPHONE HCL 2 MG/ML IJ SOLN
2.0000 mg | Freq: Four times a day (QID) | INTRAMUSCULAR | Status: DC | PRN
  Administered 2015-03-24: 4 mg via INTRAVENOUS
  Filled 2015-03-23: qty 2

## 2015-03-23 MED ORDER — FILGRASTIM 480 MCG/1.6ML IJ SOLN
480.0000 ug | Freq: Every day | INTRAMUSCULAR | Status: DC
  Administered 2015-03-23 – 2015-03-26 (×4): 480 ug via SUBCUTANEOUS
  Filled 2015-03-23 (×5): qty 1.6

## 2015-03-23 MED ORDER — BISACODYL 5 MG PO TBEC
10.0000 mg | DELAYED_RELEASE_TABLET | Freq: Two times a day (BID) | ORAL | Status: DC
  Administered 2015-03-23 – 2015-03-26 (×7): 10 mg via ORAL
  Filled 2015-03-23 (×7): qty 2

## 2015-03-23 NOTE — Progress Notes (Signed)
Mark [name] had an uneventful weekend. He was transfused platelets on Friday. Over the weekend, and platelet count maintained stability and 38,000. Today, his platelet count is 23,000. His calcium is 7.7. Albumin is 3.5.  His white cell counts come up very slowly. Today it is only 0.5. I will try him on some Neupogen.  He has not had any fever. He is still on IV antibiotics.  He's had no bleeding. He's had no nausea or vomiting. He is eating well. He has had some constipation. I'll try him on some Dulcolax.  His appetite has been good.  [name], his vital signs are stable. Temperature 98.7. Pulse 91. Blood pressure 110/60. Oral exam is negative for mucositis. Lungs are clear. Cardiac exam regular rate and rhythm with no murmurs, rubs or bruits. Abdomen is soft. He has good bowel sounds. There is no fluid wave. There is no palpable liver or spleen tip. Extremities shows no clubbing, cyanosis or edema. Skin exam no rashes ecchymoses or petechia. Neurological exam is nonfocal.  Mr. Cowgill has pancytopenia from chemotherapy.  His 24-hour urine came back with an improvement in the kappa light chains. His Kappa Lightchain excretion is 2340 mg per day.   I will see what Neupogen can do to try to help improve his blood count.  Hopefully, if we see continued improvement in his white cell count, we can get him home in a day or so and have him on oral antibiotics.  I appreciate the outstanding care that he is getting for everybody on [place]  Romans 16:20

## 2015-03-23 NOTE — Progress Notes (Signed)
ANTIBIOTIC CONSULT NOTE - FOLLOW UP  Pharmacy Consult for Primaxin Indication: Febrile neutropenia  Allergies  Allergen Reactions  . Gadolinium Derivatives Nausea And Vomiting    Pt became very nauseous and got sick.     Patient Measurements: Height: '5\' 11"'  (180.3 cm) Weight: 181 lb (82.101 kg) IBW/kg (Calculated) : 75.3 Adjusted Body Weight:   Vital Signs: Temp: 98.7 F (37.1 C) (08/22 0532) Temp Source: Oral (08/22 0532) BP: 110/60 mmHg (08/22 0532) Pulse Rate: 91 (08/22 0532) Intake/Output from previous day: 08/21 0701 - 08/22 0700 In: 840 [P.O.:840] Out: -  Intake/Output from this shift:    Labs:  Recent Labs  03/21/15 0512 03/22/15 0536 03/23/15 0525  WBC 0.4* 0.4* 0.5*  HGB 8.5* 8.3* 8.4*  PLT 9* 38*  38* 23*  CREATININE 0.96 0.79 0.77   Estimated Creatinine Clearance: 128.1 mL/min (by C-G formula based on Cr of 0.77). No results for input(s): VANCOTROUGH, VANCOPEAK, VANCORANDOM, GENTTROUGH, GENTPEAK, GENTRANDOM, TOBRATROUGH, TOBRAPEAK, TOBRARND, AMIKACINPEAK, AMIKACINTROU, AMIKACIN in the last 72 hours.   Microbiology: Recent Results (from the past 720 hour(s))  Culture, blood (routine x 2) Call MD if unable to obtain prior to antibiotics being given     Status: None   Collection Time: 02/28/15  1:20 AM  Result Value Ref Range Status   Specimen Description BLOOD LEFT HAND  Final   Special Requests BOTTLES DRAWN AEROBIC ONLY 5CC  Final   Culture   Final    NO GROWTH 5 DAYS Performed at [facility]    Report Status 03/05/2015 FINAL  Final  Culture, blood (routine x 2) Call MD if unable to obtain prior to antibiotics being given     Status: None   Collection Time: 02/28/15  1:22 AM  Result Value Ref Range Status   Specimen Description BLOOD LEFT ANTECUBITAL  Final   Special Requests BOTTLES DRAWN AEROBIC ONLY 5CC  Final   Culture   Final    NO GROWTH 5 DAYS Performed at [facility]    Report Status 03/05/2015 FINAL  Final   Culture, sputum-assessment     Status: None   Collection Time: 03/01/15  2:10 PM  Result Value Ref Range Status   Specimen Description SPUTUM  Final   Special Requests NONE  Final   Sputum evaluation   Final    MICROSCOPIC FINDINGS SUGGEST THAT THIS SPECIMEN IS NOT REPRESENTATIVE OF LOWER RESPIRATORY SECRETIONS. PLEASE RECOLLECT. NOTIFIED JOHNSON,A AT [id number] ON [id number] BY HOOKER,B    Report Status 03/01/2015 FINAL  Final  Blood Culture (routine x 2)     Status: None   Collection Time: 03/14/15  3:40 AM  Result Value Ref Range Status   Specimen Description BLOOD RIGHT ANTECUBITAL  Final   Special Requests BOTTLES DRAWN AEROBIC AND ANAEROBIC 5ML  Final   Culture   Final    NO GROWTH 5 DAYS Performed at [facility]    Report Status 03/19/2015 FINAL  Final  Blood Culture (routine x 2)     Status: None   Collection Time: 03/14/15  3:45 AM  Result Value Ref Range Status   Specimen Description BLOOD PORTA CATH  Final   Special Requests BOTTLES DRAWN AEROBIC AND ANAEROBIC 4ML  Final   Culture   Final    NO GROWTH 5 DAYS Performed at [facility]    Report Status 03/19/2015 FINAL  Final  Urine culture     Status: None   Collection Time: 03/14/15  3:50 AM  Result Value Ref Range Status   Specimen Description URINE, CLEAN CATCH  Final   Special Requests NONE  Final   Culture   Final    NO GROWTH 1 DAY Performed at [facility]    Report Status 03/15/2015 FINAL  Final    Anti-infectives    Start     Dose/Rate Route Frequency Ordered Stop   03/14/15 1000  famciclovir [facility]) tablet 500 mg     500 mg Oral Daily 03/14/15 0408     03/14/15 0900  imipenem-cilastatin (PRIMAXIN) 500 mg in sodium chloride 0.9 % 100 mL IVPB     500 mg 200 mL/hr over 30 Minutes Intravenous Every 6 hours 03/14/15 0430     03/14/15 0400  levofloxacin (LEVAQUIN) IVPB 750 mg  Status:  Discontinued     750 mg 100 mL/hr over 90 Minutes Intravenous Every 24 hours 03/14/15 0355 03/14/15  0359   03/14/15 0330  vancomycin (VANCOCIN) 1,500 mg in sodium chloride 0.9 % 500 mL IVPB     1,500 mg 250 mL/hr over 120 Minutes Intravenous STAT 03/14/15 0318 03/14/15 0549   03/14/15 0330  piperacillin-tazobactam (ZOSYN) IVPB 4.5 g  Status:  Discontinued     4.5 g 200 mL/hr over 30 Minutes Intravenous  Once 03/14/15 0318 03/14/15 0319   03/14/15 0330  piperacillin-tazobactam (ZOSYN) IVPB 3.375 g     3.375 g 12.5 mL/hr over 240 Minutes Intravenous STAT 03/14/15 0320 03/14/15 0431      Assessment: 3 yoM with relapsed multiple myeloma on salvage chemotherapy presents with left-sided chest wall pain with SOB and pancytopenia.  Started on empiric primaxin for febrile neutropenia. Plan per oncology is to continue primaxin until WBC >/= 1000.  Added Neupogen 480 mcg daily 8/22.  Anti-infectives 8/13 >>Vanc x 1 8/13 >>Zosyn x 1 8/13 >> Primaxin >>   Famvir for HSV px  Vitals/Labs WBC: Slowly trending up, today 500. ANC 300. Tm24h: 98.7 SCr stable, CrCl >100  Cultures 8/13 blood x2: NGF 8/13 urine: NGF  Goal of Therapy:  Eradication of infection  Plan:  Continue primaxin 537m IV q6h F/u WBC, renal fxn, clinical course  AHershal Coria PharmD, BCPS Pager: [phone]/22/2016 8:25 AM

## 2015-03-23 NOTE — Progress Notes (Signed)
During rounds, chaplain had the opportunity to see patient. Patient walked up the hall speaking positive affirmations about his situation  and hiscircumstances. Chaplain encouraged patient. Will visit patient tomorrow.   03/23/15 1200  Clinical Encounter Type  Visited With Patient  Visit Type Follow-up;Social support;Spiritual support  Referral From Nurse  Consult/Referral To [employer]

## 2015-03-24 ENCOUNTER — Ambulatory Visit: Payer: BLUE CROSS/BLUE SHIELD | Admitting: Hematology & Oncology

## 2015-03-24 ENCOUNTER — Other Ambulatory Visit: Payer: BLUE CROSS/BLUE SHIELD

## 2015-03-24 LAB — CBC WITH DIFFERENTIAL/PLATELET
BASOS PCT: 0 % (ref 0–1)
Basophils Absolute: 0 10*3/uL (ref 0.0–0.1)
EOS ABS: 0 10*3/uL (ref 0.0–0.7)
Eosinophils Relative: 0 % (ref 0–5)
HCT: 24.7 % — ABNORMAL LOW (ref 39.0–52.0)
HEMOGLOBIN: 8.3 g/dL — AB (ref 13.0–17.0)
LYMPHS ABS: 0.1 10*3/uL — AB (ref 0.7–4.0)
Lymphocytes Relative: 20 % (ref 12–46)
MCH: 30.9 pg (ref 26.0–34.0)
MCHC: 33.6 g/dL (ref 30.0–36.0)
MCV: 91.8 fL (ref 78.0–100.0)
MONO ABS: 0.1 10*3/uL (ref 0.1–1.0)
Monocytes Relative: 10 % (ref 3–12)
NEUTROS ABS: 0.5 10*3/uL — AB (ref 1.7–7.7)
Neutrophils Relative %: 70 % (ref 43–77)
Platelets: 15 10*3/uL — CL (ref 150–400)
RBC: 2.69 MIL/uL — ABNORMAL LOW (ref 4.22–5.81)
RDW: 13.8 % (ref 11.5–15.5)
WBC: 0.7 10*3/uL — CL (ref 4.0–10.5)

## 2015-03-24 LAB — COMPREHENSIVE METABOLIC PANEL
ALK PHOS: 159 U/L — AB (ref 38–126)
ALT: 13 U/L — ABNORMAL LOW (ref 17–63)
ANION GAP: 8 (ref 5–15)
AST: 14 U/L — ABNORMAL LOW (ref 15–41)
Albumin: 3.5 g/dL (ref 3.5–5.0)
BUN: 14 mg/dL (ref 6–20)
CALCIUM: 8 mg/dL — AB (ref 8.9–10.3)
CO2: 27 mmol/L (ref 22–32)
Chloride: 107 mmol/L (ref 101–111)
Creatinine, Ser: 0.89 mg/dL (ref 0.61–1.24)
GFR calc non Af Amer: >60 mL/min (ref >60)
Glucose, Bld: 108 mg/dL — ABNORMAL HIGH (ref 65–99)
POTASSIUM: 4.4 mmol/L (ref 3.5–5.1)
SODIUM: 142 mmol/L (ref 135–145)
TOTAL PROTEIN: 6.1 g/dL — AB (ref 6.5–8.1)
Total Bilirubin: 0.6 mg/dL (ref 0.3–1.2)

## 2015-03-24 LAB — GLUCOSE, CAPILLARY
GLUCOSE-CAPILLARY: 89 mg/dL (ref 65–99)
GLUCOSE-CAPILLARY: 186 mg/dL — AB (ref 65–99)
Glucose-Capillary: 114 mg/dL — ABNORMAL HIGH (ref 65–99)
Glucose-Capillary: 154 mg/dL — ABNORMAL HIGH (ref 65–99)

## 2015-03-24 MED ORDER — LORATADINE 10 MG PO TABS
10.0000 mg | ORAL_TABLET | Freq: Every day | ORAL | Status: DC
  Administered 2015-03-24 – 2015-03-26 (×3): 10 mg via ORAL
  Filled 2015-03-24 (×3): qty 1

## 2015-03-24 NOTE — Progress Notes (Signed)
Mark [name] is doing okay. He had no problems yesterday.  His white cell count is up to 0.7. His platelet count is down to 15,000 but he is not bleeding.  He is afebrile.  I'm going to discontinue the Primaxin for right now.  His hemoglobin is 8.3. His calcium is 8.0.  He is out of bed. He had a good bowel movement yesterday.  He is having some pain. I think this might be from the Neupogen. I might try him on some Claritin which can help with bone pain from Neupogen.  He's had no mouth sores. He's had no nausea or vomiting. He's had no cough. He's had no leg swelling.  [name], his vital signs show temperature of 98.3. Pulse is 97. And his blood pressure is 130/74. Oral exam shows no mucositis. Lungs are clear. Cardiac exam regular rate and rhythm with no murmurs, rubs or bruits. Abdomen is soft. There is no fluid wave. There is no palpable liver or spleen tip. Extremities shows no clubbing, cyanosis or edema.  Hopefully, we'll be able to get his white cell count above 1.0. Once he is there, we will be able to discharge him.  I will hold on giving him any platelets right now.  I, as always, very much appreciate the out stating care he is getting from everybody up on [place]

## 2015-03-24 NOTE — Progress Notes (Signed)
Had a brief visit w/Mr. Bolon. He was face time visiting w/his wife but said he was fine. He said he has family and church support and that he really is ok and appreciated visit. Chaplain Marlise Eves Holder   03/24/15 1900  Clinical Encounter Type  Visited With Patient

## 2015-03-25 ENCOUNTER — Other Ambulatory Visit: Payer: BLUE CROSS/BLUE SHIELD

## 2015-03-25 ENCOUNTER — Telehealth: Payer: Self-pay | Admitting: Hematology & Oncology

## 2015-03-25 ENCOUNTER — Ambulatory Visit: Payer: BLUE CROSS/BLUE SHIELD

## 2015-03-25 LAB — COMPREHENSIVE METABOLIC PANEL
ALT: 13 U/L — AB (ref 17–63)
ANION GAP: 7 (ref 5–15)
AST: 15 U/L (ref 15–41)
Albumin: 3.6 g/dL (ref 3.5–5.0)
Alkaline Phosphatase: 154 U/L — ABNORMAL HIGH (ref 38–126)
BUN: 16 mg/dL (ref 6–20)
CHLORIDE: 104 mmol/L (ref 101–111)
CO2: 28 mmol/L (ref 22–32)
CREATININE: 0.86 mg/dL (ref 0.61–1.24)
Calcium: 8.3 mg/dL — ABNORMAL LOW (ref 8.9–10.3)
Glucose, Bld: 110 mg/dL — ABNORMAL HIGH (ref 65–99)
POTASSIUM: 4.8 mmol/L (ref 3.5–5.1)
Sodium: 139 mmol/L (ref 135–145)
Total Bilirubin: 0.6 mg/dL (ref 0.3–1.2)
Total Protein: 6 g/dL — ABNORMAL LOW (ref 6.5–8.1)

## 2015-03-25 LAB — CBC WITH DIFFERENTIAL/PLATELET
BASOS PCT: 0 % (ref 0–1)
Basophils Absolute: 0 10*3/uL (ref 0.0–0.1)
EOS PCT: 0 % (ref 0–5)
Eosinophils Absolute: 0 10*3/uL (ref 0.0–0.7)
HCT: 23.4 % — ABNORMAL LOW (ref 39.0–52.0)
Hemoglobin: 7.7 g/dL — ABNORMAL LOW (ref 13.0–17.0)
LYMPHS ABS: 0.2 10*3/uL — AB (ref 0.7–4.0)
Lymphocytes Relative: 15 % (ref 12–46)
MCH: 30 pg (ref 26.0–34.0)
MCHC: 32.9 g/dL (ref 30.0–36.0)
MCV: 91.1 fL (ref 78.0–100.0)
MONO ABS: 0.1 10*3/uL (ref 0.1–1.0)
Monocytes Relative: 9 % (ref 3–12)
NEUTROS ABS: 1.1 10*3/uL — AB (ref 1.7–7.7)
Neutrophils Relative %: 76 % (ref 43–77)
PLATELETS: 10 10*3/uL — AB (ref 150–400)
RBC: 2.57 MIL/uL — ABNORMAL LOW (ref 4.22–5.81)
RDW: 13.8 % (ref 11.5–15.5)
WBC: 1.4 10*3/uL — CL (ref 4.0–10.5)

## 2015-03-25 LAB — PREPARE RBC (CROSSMATCH)

## 2015-03-25 LAB — GLUCOSE, CAPILLARY
GLUCOSE-CAPILLARY: 90 mg/dL (ref 65–99)
GLUCOSE-CAPILLARY: 141 mg/dL — AB (ref 65–99)
GLUCOSE-CAPILLARY: 108 mg/dL — AB (ref 65–99)
Glucose-Capillary: 126 mg/dL — ABNORMAL HIGH (ref 65–99)

## 2015-03-25 MED ORDER — SODIUM CHLORIDE 0.9 % IV SOLN
Freq: Once | INTRAVENOUS | Status: AC
Start: 2015-03-25 — End: 2015-03-25
  Administered 2015-03-25: 11:00:00 via INTRAVENOUS

## 2015-03-25 MED ORDER — FUROSEMIDE 10 MG/ML IJ SOLN
20.0000 mg | Freq: Once | INTRAMUSCULAR | Status: AC
  Administered 2015-03-25: 20 mg via INTRAVENOUS
  Filled 2015-03-25 (×2): qty 2

## 2015-03-25 NOTE — Telephone Encounter (Signed)
UPDATE CORRECTION           [id number] NEULASTA  APPROVED [id number]   03/10/2015 - 09/27/2015                 P: AIM @ [phone] F: [phone] fax clinicals  P: [phone] #[phone] peer to peer    [place] Member #: [id number]

## 2015-03-25 NOTE — Progress Notes (Signed)
Mark [name]  is doing okay. He is off antibiotics. He has had no fever.  He is clinically Is down to 10,000. His Hemoglobin Is 7.7. He Will Need to Be Transfused with Both Red Cells and Platelets Today.  His White Cell Count Is Now up to 1.4. I Think Once We Were Able to Transfuse Him, Then We Will Be Able to Let [place].  He Is Having Some Bone Issues. I Think the Bone Pain Is Coming from the Neupogen.  His Calcium Is 8.3. Albumin Is 3.6.  He's Had No Mouth Sores. He's Had No Nausea or Vomiting. He Still Is a Little Constipated.  He's Had No Problems with Rashes. He's Had No Cough. There's Been No Bleeding.  [name], His Vital Signs Are Stable. His Temperature Is 98.4. Pulse 93. Blood Pressure 115/70. Head and Neck Exam Shows No Ocular or Oral Lesions. He Has No Adenopathy in the Neck. Lungs Are Clear. Cardiac Exam Regular Rate and Rhythm with No Murmurs, Rubs or Bruits. Abdomen Is Soft. He Has Good Bowel Sounds. There Is No Fluid Wave. Extremities Shows No Clubbing, Cyanosis or Edema.  We Will Go Ahead and Transfuse Him Today. I Think It Will Probably Take Most the Day to Transfuse Him.  We Will and Plan to Let [place].  I Will Give Him a Dose of Neupogen Today.   Mark E.  Romans 5:3-5

## 2015-03-26 ENCOUNTER — Other Ambulatory Visit: Payer: Self-pay | Admitting: Hematology & Oncology

## 2015-03-26 DIAGNOSIS — C9000 Multiple myeloma not having achieved remission: Secondary | ICD-10-CM

## 2015-03-26 LAB — CBC WITH DIFFERENTIAL/PLATELET
BASOS ABS: 0 10*3/uL (ref 0.0–0.1)
Basophils Relative: 1 % (ref 0–1)
Eosinophils Absolute: 0 10*3/uL (ref 0.0–0.7)
Eosinophils Relative: 0 % (ref 0–5)
HEMATOCRIT: 34.2 % — AB (ref 39.0–52.0)
HEMOGLOBIN: 11.7 g/dL — AB (ref 13.0–17.0)
LYMPHS PCT: 15 % (ref 12–46)
Lymphs Abs: 0.3 10*3/uL — ABNORMAL LOW (ref 0.7–4.0)
MCH: 30.1 pg (ref 26.0–34.0)
MCHC: 34.2 g/dL (ref 30.0–36.0)
MCV: 87.9 fL (ref 78.0–100.0)
MONOS PCT: 12 % (ref 3–12)
Monocytes Absolute: 0.3 10*3/uL (ref 0.1–1.0)
Neutro Abs: 1.6 10*3/uL — ABNORMAL LOW (ref 1.7–7.7)
Neutrophils Relative %: 72 % (ref 43–77)
Platelets: 28 10*3/uL — CL (ref 150–400)
RBC: 3.89 MIL/uL — AB (ref 4.22–5.81)
RDW: 15.9 % — ABNORMAL HIGH (ref 11.5–15.5)
WBC: 2.2 10*3/uL — AB (ref 4.0–10.5)

## 2015-03-26 LAB — COMPREHENSIVE METABOLIC PANEL
ALBUMIN: 4.4 g/dL (ref 3.5–5.0)
ALK PHOS: 184 U/L — AB (ref 38–126)
ALT: 15 U/L — ABNORMAL LOW (ref 17–63)
AST: 19 U/L (ref 15–41)
Anion gap: 9 (ref 5–15)
BILIRUBIN TOTAL: 1.1 mg/dL (ref 0.3–1.2)
BUN: 18 mg/dL (ref 6–20)
CALCIUM: 9.4 mg/dL (ref 8.9–10.3)
CO2: 30 mmol/L (ref 22–32)
Chloride: 104 mmol/L (ref 101–111)
Creatinine, Ser: 0.99 mg/dL (ref 0.61–1.24)
GFR calc Af Amer: >60 mL/min (ref >60)
GFR calc non Af Amer: >60 mL/min (ref >60)
GLUCOSE: 96 mg/dL (ref 65–99)
POTASSIUM: 4.4 mmol/L (ref 3.5–5.1)
Sodium: 143 mmol/L (ref 135–145)
TOTAL PROTEIN: 7.6 g/dL (ref 6.5–8.1)

## 2015-03-26 LAB — TYPE AND SCREEN
ABO/RH(D): B POS
Antibody Screen: POSITIVE
DAT, IGG: NEGATIVE
UNIT DIVISION: 0
Unit division: 0

## 2015-03-26 LAB — PREPARE PLATELET PHERESIS: Unit division: 0

## 2015-03-26 LAB — GLUCOSE, CAPILLARY: Glucose-Capillary: 81 mg/dL (ref 65–99)

## 2015-03-26 MED ORDER — HEPARIN SOD (PORK) LOCK FLUSH 100 UNIT/ML IV SOLN
500.0000 [IU] | Freq: Once | INTRAVENOUS | Status: AC
  Administered 2015-03-26: 500 [IU] via INTRAVENOUS
  Filled 2015-03-26: qty 5

## 2015-03-26 NOTE — Progress Notes (Signed)
Chaplain visited patient during rounds. Chaplain provided a presence as well as support. Chaplain encouraged patient and prayed a prayer of comfort. Chaplain services are available upon request.   03/26/15 1100  Clinical Encounter Type  Visited With Patient  Visit Type Follow-up;Spiritual support;Social support  Referral From Nurse  Consult/Referral To Chaplain

## 2015-03-26 NOTE — Discharge Summary (Addendum)
Patient ID: Quantez Schnyder MRN: [id number] DOB/AGE: 43-Jun-1972 43 y.o.  Admit date: 03/14/2015 Discharge date: 03/26/2015  Patient Care Team: No Pcp Per Patient as PCP - General (General Practice)  Brief History of Present Illness: For complete details please refer to admission H and P, but in brief, Mr. Bochenek is a 44 year old African-American male with a history of Kappa light chain multiple myeloma admitted with pancytopenia and febrile neutropenia   Discharge Diagnoses/Hospital Course:    Febrile neutropenia Neutropenia appears to likely be related to chemotherapy.  He was on Daratumumab with Velcade and Decadron then received VD-PACE from 03/04/2015 to 03/08/2015 followed by Neulasta shot as per patient.  Daratumumab is known to cause prolonged cytopenias that can last up to 3 weeks given its longer half-life.  he was placed on neutropenic precautions. He received empiricPrimaxin since admission until WBC reached 1k.  Blood cultures negative.  Patient is continued on famciclovir. He was afebrile Last neupogen given on 8/24.   Kappa light chain due to multiple myeloma  status post previous lumbar stem cell transplantation at [facility] in January 2015 With relapsed within a year, despite post transplant maintenance therapy He is on Daratumumab with Velcade and Decadron and received VD-PACE on 8/3 to 8/4. He continued shingles prophylaxis All myeloma treatment was placed on hold while pancytopenic. Patient to follow up at the clinic on 8/29 at which time further recommendations are to proceed  Thrombocytopenia  Early DIC likely related to chemotherapy, possible infection or antibiotics. Fibrinogen and D-dimer remained stable. APTT normal. Received FFP on 8/13 He also received platelet transfusion on 8/24 for a platelet of 10,000 with good response, today at 28,000  Severe anemia Transfused  With PRBC( 2 units) on 8/13 and 2 units on 8/24 with good response.   No bleeding issues reported today Will monitor as outpatient  Left-sided chest wall pain with shortness of breath Chest x-ray unremarkable  CT angiogram of the chest one month ago was negative for PE.  Pain is likely musculoskeletal.  Continued with fentanyl patch and Lidoderm patch.  Pain has Improved.  VT prophylaxis On SCDs     Past Medical History  Diagnosis Date  . History of radiation therapy 02/07/13- 02/26/13    lower L spine, upper sacrum, 35 gray in 14 fractions  . FH: chemotherapy     Dr. Burney Gauze  . Cancer   . Multiple myeloma dx'd 01/2013  . GERD (gastroesophageal reflux disease)     Discharge Medications:    Medication List    STOP taking these medications        acetaminophen 500 MG tablet  Commonly known as:  TYLENOL     dexamethasone 4 MG tablet  Commonly known as:  DECADRON     Insulin Detemir 100 UNIT/ML Pen  Commonly known as:  LEVEMIR FLEXPEN     Insulin Pen Needle 31G X 6 MM Misc     montelukast 10 MG tablet  Commonly known as:  SINGULAIR      TAKE these medications        bisacodyl 5 MG EC tablet  Commonly known as:  DULCOLAX  Take 5 mg by mouth daily as needed for moderate constipation (constipation).     famciclovir 500 MG tablet  Commonly known as:  FAMVIR  Take 1 tablet (500 mg total) by mouth daily.     fentaNYL 75 MCG/HR  Commonly known as:  DURAGESIC - dosed mcg/hr  Place 1 patch (75 mcg total)  onto the skin every 3 (three) days.     fluconazole 100 MG tablet  Commonly known as:  DIFLUCAN  Take 1 tablet (100 mg total) by mouth daily.     gabapentin 300 MG capsule  Commonly known as:  NEURONTIN  Take 2 capsules (600 mg total) by mouth 3 (three) times daily.     HYDROmorphone 4 MG tablet  Commonly known as:  DILAUDID  Take 1-2, IF NEEDED, for pain every 4 hrs for pain     Ipratropium-Albuterol 20-100 MCG/ACT Aers respimat  Commonly known as:  COMBIVENT  Inhale 1 puff into the lungs every 6 (six) hours.      lidocaine-prilocaine cream  Commonly known as:  EMLA  Apply to affected area once     LORazepam 0.5 MG tablet  Commonly known as:  ATIVAN  Take 1 tablet (0.5 mg total) by mouth every 6 (six) hours as needed (Nausea or vomiting).     metoCLOPramide 10 MG tablet  Commonly known as:  REGLAN  Take 1 tablet (10 mg total) by mouth every 6 (six) hours as needed for nausea or vomiting.     ondansetron 8 MG disintegrating tablet  Commonly known as:  ZOFRAN ODT  Take 1 tablet (8 mg total) by mouth every 8 (eight) hours as needed for nausea or vomiting.     pantoprazole 40 MG tablet  Commonly known as:  PROTONIX  Take 1 tablet (40 mg total) by mouth daily.     prochlorperazine 10 MG tablet  Commonly known as:  COMPAZINE  Take 1 tablet (10 mg total) by mouth every 6 (six) hours as needed (Nausea or vomiting).     saccharomyces boulardii 250 MG capsule  Commonly known as:  FLORASTOR  Take 1 capsule (250 mg total) by mouth 2 (two) times daily.     temazepam 22.5 MG capsule  Commonly known as:  RESTORIL  Take 1 capsule (22.5 mg total) by mouth at bedtime as needed for sleep.        Discharge Condition: Improved.  Diet recommendation:  Regular.   Disposition and Follow-up:  Discharge Instructions    Complete patient signature process for consent form    Complete by:  As directed      De-access Port A Cath    Complete by:  As directed      Discharge patient    Complete by:  As directed      PICC line removal    Complete by:  As directed      Practitioner attestation of consent    Complete by:  As directed   I, the ordering practitioner, attest that I have discussed with the patient the benefits, risks, side effects, alternatives, likelihood of achieving goals and potential problems during recovery for the procedure listed.  Procedure:  Blood Product(s)          Follow-up Information    Follow up with [name], MD.   Specialty:  Oncology   Why:  come to office on 8/29  for labs and doctor visit   Contact information:   [address], [address] [place] [address] [phone]        ECOG PERFORMANCE STATUS:1  Physical Exam at Discharge:  Subjective: patient feeling better. He is afebrile. Denies respiratory or cardiac complaints. Denies any rashes or mucositis. Denies nausea, vomiting or diarrhea. Ambulating without difficulty. He has some bone pain related to GCSF meds.  Objective:  BP 107/72 mmHg  Pulse  94  Temp(Src) 98.6 F (37 C) (Oral)  Resp 20  Ht '5\' 11"'  (1.803 m)  Wt 181 lb (82.101 kg)  BMI 25.26 kg/m2  SpO2 97%   GENERAL:alert, no distress and comfortable SKIN: skin color, texture, turgor are normal, no rashes or significant lesions EYES: normal, conjunctiva are pink and non-injected, sclera clear OROPHARYNX:no exudate, no erythema and lips, buccal mucosa, and tongue normal  NECK: supple, thyroid normal size, non-tender, without nodularity LYMPH:  no palpable lymphadenopathy in the cervical, axillary or inguinal area LUNGS: clear to auscultation and percussion with normal breathing effort HEART: regular rate & rhythm and no murmurs and no lower extremity edema ABDOMEN:abdomen soft, non-tender and normal bowel sounds Musculoskeletal:no cyanosis of digits and no clubbing  PSYCH: alert & oriented x 3 with fluent speech NEURO: no focal motor/sensory deficits    Significant Diagnostic Studies:  Dg Chest 2 View  03/14/2015   CLINICAL DATA:  Shortness breath.  Chest pain.  Multiple myeloma.  EXAM: CHEST - 2 VIEW  COMPARISON:  Two-view chest x-ray 03/06/2015  FINDINGS: Heart size is normal. A right IJ Port-A-Cath is stable. The lung volumes are somewhat low. No focal airspace disease is present. There is no edema or effusion to suggest failure. The visualized soft tissues are within normal limits. Focal kyphosis in the lower thoracic spine is stable.  IMPRESSION: 1. No acute cardiopulmonary disease or significant interval change. 2.  Persistent low lung volumes. 3. Stable right IJ Port-A-Cath.   Electronically Signed   By: San Morelle M.D.   On: 03/14/2015 02:46     Discharge Laboratory Values:  CBC  Recent Labs Lab 03/22/15 0536 03/23/15 0525 03/24/15 0519 03/25/15 0520 03/26/15 0455  WBC 0.4* 0.5* 0.7* 1.4* 2.2*  HGB 8.3* 8.4* 8.3* 7.7* 11.7*  HCT 25.4* 25.4* 24.7* 23.4* 34.2*  PLT 38*  38* 23* 15* 10* 28*  MCV 90.7 92.0 91.8 91.1 87.9  MCH 29.6 30.4 30.9 30.0 30.1  MCHC 32.7 33.1 33.6 32.9 34.2  RDW 13.8 13.9 13.8 13.8 15.9*  LYMPHSABS 0.1* 0.1* 0.1* 0.2* 0.3*  MONOABS 0.0* 0.1 0.1 0.1 0.3  EOSABS 0.0 0.0 0.0 0.0 0.0  BASOSABS 0.0 0.0 0.0 0.0 0.0     Chemistries   Recent Labs Lab 03/22/15 0536 03/23/15 0525 03/24/15 0519 03/25/15 0520 03/26/15 0455  NA 142 143 142 139 143  K 4.6 4.8 4.4 4.8 4.4  CL 108 109 107 104 104  CO2 '28 28 27 28 30  ' GLUCOSE 101* 98 108* 110* 96  BUN '11 12 14 16 18  ' CREATININE 0.79 0.77 0.89 0.86 0.99  CALCIUM 7.7* 7.7* 8.0* 8.3* 9.4     Coagulation profile  Recent Labs Lab 03/22/15 0536  INR 0.93    Signed: Sharene Butters, PA-C 03/26/2015, 8:12 AM    ADDENDUM:  I saw and examined the patient upon discharge. I agree with the above.  We will see him back in the office next week. I think that his calcium will clearly tell us how well he is going to do.  We will also make sure that he has an appointment down at [facility]. He was to see the transplant doctors last week but because his admission, he cannot make it. I will have to let them know of his discharge so they can set up an appointment for him.  Lum Keas

## 2015-03-26 NOTE — Progress Notes (Signed)
Patient discharged to home with family, discharge instructions reviewed with patient who verbalized understanding. No new RX's.  

## 2015-03-30 ENCOUNTER — Encounter: Payer: Self-pay | Admitting: Family

## 2015-03-30 ENCOUNTER — Ambulatory Visit (HOSPITAL_BASED_OUTPATIENT_CLINIC_OR_DEPARTMENT_OTHER): Payer: BLUE CROSS/BLUE SHIELD | Admitting: Family

## 2015-03-30 ENCOUNTER — Other Ambulatory Visit (HOSPITAL_BASED_OUTPATIENT_CLINIC_OR_DEPARTMENT_OTHER): Payer: BLUE CROSS/BLUE SHIELD

## 2015-03-30 ENCOUNTER — Other Ambulatory Visit: Payer: Self-pay | Admitting: Family

## 2015-03-30 VITALS — BP 123/87 | HR 87 | Temp 98.1°F | Resp 16 | Ht 71.0 in | Wt 204.0 lb

## 2015-03-30 DIAGNOSIS — C9002 Multiple myeloma in relapse: Secondary | ICD-10-CM

## 2015-03-30 DIAGNOSIS — J189 Pneumonia, unspecified organism: Secondary | ICD-10-CM | POA: Diagnosis not present

## 2015-03-30 DIAGNOSIS — C9000 Multiple myeloma not having achieved remission: Secondary | ICD-10-CM

## 2015-03-30 DIAGNOSIS — J181 Lobar pneumonia, unspecified organism: Secondary | ICD-10-CM

## 2015-03-30 LAB — CBC WITH DIFFERENTIAL (CANCER CENTER ONLY)
BASO#: 0 10*3/uL (ref 0.0–0.2)
BASO%: 0 % (ref 0.0–2.0)
EOS%: 0 % (ref 0.0–7.0)
Eosinophils Absolute: 0 10*3/uL (ref 0.0–0.5)
HCT: 29.1 % — ABNORMAL LOW (ref 38.7–49.9)
HGB: 10 g/dL — ABNORMAL LOW (ref 13.0–17.1)
LYMPH#: 0.5 10*3/uL — ABNORMAL LOW (ref 0.9–3.3)
LYMPH%: 17.9 % (ref 14.0–48.0)
MCH: 30.7 pg (ref 28.0–33.4)
MCHC: 34.4 g/dL (ref 32.0–35.9)
MCV: 89 fL (ref 82–98)
MONO#: 0.7 10*3/uL (ref 0.1–0.9)
MONO%: 27.2 % — ABNORMAL HIGH (ref 0.0–13.0)
NEUT#: 1.5 10*3/uL (ref 1.5–6.5)
NEUT%: 54.9 % (ref 40.0–80.0)
Platelets: 11 10*3/uL — ABNORMAL LOW (ref 145–400)
RBC: 3.26 10*6/uL — ABNORMAL LOW (ref 4.20–5.70)
RDW: 14.6 % (ref 11.1–15.7)
WBC: 2.7 10*3/uL — ABNORMAL LOW (ref 4.0–10.0)

## 2015-03-30 LAB — CMP (CANCER CENTER ONLY)
ALBUMIN: 3.8 g/dL (ref 3.3–5.5)
ALT: 25 U/L (ref 10–47)
AST: 25 U/L (ref 11–38)
Alkaline Phosphatase: 137 U/L — ABNORMAL HIGH (ref 26–84)
BILIRUBIN TOTAL: 0.9 mg/dL (ref 0.20–1.60)
BUN, Bld: 11 mg/dL (ref 7–22)
CO2: 27 meq/L (ref 18–33)
CREATININE: 1 mg/dL (ref 0.6–1.2)
Calcium: 7.6 mg/dL — ABNORMAL LOW (ref 8.0–10.3)
Chloride: 103 mEq/L (ref 98–108)
Glucose, Bld: 102 mg/dL (ref 73–118)
Potassium: 4.2 mEq/L (ref 3.3–4.7)
SODIUM: 142 meq/L (ref 128–145)
Total Protein: 6.5 g/dL (ref 6.4–8.1)

## 2015-03-30 MED ORDER — IPRATROPIUM-ALBUTEROL 20-100 MCG/ACT IN AERS: 1.0000 | INHALATION_SPRAY | Freq: Four times a day (QID) | RESPIRATORY_TRACT | Status: DC

## 2015-03-30 MED ORDER — FLUCONAZOLE 100 MG PO TABS: 100.0000 mg | ORAL_TABLET | Freq: Every day | ORAL | Status: DC

## 2015-03-30 NOTE — Progress Notes (Signed)
Hematology and Oncology Follow Up Visit  Mark [name] [id number] [birth year] 44 y.o. 03/30/2015   Principle Diagnosis:  Relapsed kappa light chain myeloma - July 2016  - transplant at [facility] in February 2015   Current Therapy:   Salvage VD-PACE q 21 days  Zometa 4 mg IV every month    Interim History: Mark [name] is here today for follow-up. He was hospitalized last week for pancytopenia and febrile neutropenia.  His WBC count today is 2.7. He has had no issues with infection.  His platelet count is down at 11. He has had no episodes of bleeding, bruising or petechiae. Hgb is 10.0. His calcium is 7.6.  He is feeling fatigued but denies needing fluids today. No syncope or falls.  He deniesfever, chills, cough, rash, headache, blurred vision, chest pain, palpitations, abdominal pain, changes in bowel or bladder habits. No blood in his urine or stool. No SOB at this time.   No lymphadenopathy found on assessment.  No swelling in his extremities. The numbness and tingling in his feet and legs is unchanged. His back still aches at times but this is unchanged. The Duragesic patch help alleviate this. No new aches or pains.  He is able to eat and has been drinking well. His weight is stable.  He has a follow-up appointment with his doctor at [facility] on Wednesday.  Medications:    Medication List       This list is accurate as of: 03/30/15  9:27 AM.  Always use your most recent med list.               bisacodyl 5 MG EC tablet  Commonly known as:  DULCOLAX  Take 5 mg by mouth daily as needed for moderate constipation (constipation).     famciclovir 500 MG tablet  Commonly known as:  FAMVIR  Take 1 tablet (500 mg total) by mouth daily.     fentaNYL 75 MCG/HR  Commonly known as:  DURAGESIC - dosed mcg/hr  Place 1 patch (75 mcg total) onto the skin every 3 (three) days.     fluconazole 100 MG tablet  Commonly known as:  DIFLUCAN  Take 1 tablet (100 mg total) by mouth  daily.     gabapentin 300 MG capsule  Commonly known as:  NEURONTIN  Take 2 capsules (600 mg total) by mouth 3 (three) times daily.     HYDROmorphone 4 MG tablet  Commonly known as:  DILAUDID  Take 1-2, IF NEEDED, for pain every 4 hrs for pain     Ipratropium-Albuterol 20-100 MCG/ACT Aers respimat  Commonly known as:  COMBIVENT  Inhale 1 puff into the lungs every 6 (six) hours.     lidocaine-prilocaine cream  Commonly known as:  EMLA  Apply to affected area once     LORazepam 0.5 MG tablet  Commonly known as:  ATIVAN  Take 1 tablet (0.5 mg total) by mouth every 6 (six) hours as needed (Nausea or vomiting).     metoCLOPramide 10 MG tablet  Commonly known as:  REGLAN  Take 1 tablet (10 mg total) by mouth every 6 (six) hours as needed for nausea or vomiting.     ondansetron 8 MG disintegrating tablet  Commonly known as:  ZOFRAN ODT  Take 1 tablet (8 mg total) by mouth every 8 (eight) hours as needed for nausea or vomiting.     pantoprazole 40 MG tablet  Commonly known as:  PROTONIX  Take 1 tablet (40 mg  total) by mouth daily.     prochlorperazine 10 MG tablet  Commonly known as:  COMPAZINE  Take 1 tablet (10 mg total) by mouth every 6 (six) hours as needed (Nausea or vomiting).     saccharomyces boulardii 250 MG capsule  Commonly known as:  FLORASTOR  Take 1 capsule (250 mg total) by mouth 2 (two) times daily.     temazepam 22.5 MG capsule  Commonly known as:  RESTORIL  Take 1 capsule (22.5 mg total) by mouth at bedtime as needed for sleep.        Allergies:  Allergies  Allergen Reactions  . Gadolinium Derivatives Nausea And Vomiting    Pt became very nauseous and got sick.     Past Medical History, Surgical history, Social history, and Family History were reviewed and updated.  Review of Systems: All other 10 point review of systems is negative.   Physical Exam:  height is 5\' 11"  (1.803 m) and weight is 204 lb (92.534 kg). His oral temperature is 98.1 F  (36.7 C). His blood pressure is 123/87 and his pulse is 87. His respiration is 16.   Wt Readings from Last 3 Encounters:  03/30/15 204 lb (92.534 kg)  03/17/15 181 lb (82.101 kg)  02/27/15 198 lb (89.812 kg)    Ocular: Sclerae unicteric, pupils equal, round and reactive to light Ear-nose-throat: Oropharynx clear, dentition fair Lymphatic: No cervical or supraclavicular adenopathy Lungs no rales or rhonchi, good excursion bilaterally Heart regular rate and rhythm, no murmur appreciated Abd soft, nontender, positive bowel sounds MSK no focal spinal tenderness, no joint edema Neuro: non-focal, well-oriented, appropriate affect  Lab Results  Component Value Date   WBC 2.7* 03/30/2015   HGB 10.0* 03/30/2015   HCT 29.1* 03/30/2015   MCV 89 03/30/2015   PLT 11* 03/30/2015   No results found for: FERRITIN, IRON, TIBC, UIBC, IRONPCTSAT Lab Results  Component Value Date   RBC 3.26* 03/30/2015   Lab Results  Component Value Date   KPAFRELGTCHN 175.71* 03/17/2015   LAMBDASER <1.30* 03/17/2015   KAPLAMBRATIO 5200.00* 03/17/2015   Lab Results  Component Value Date   IGGSERUM 535* 03/10/2015   IGA 533* 03/10/2015   IGMSERUM 23* 03/10/2015   Lab Results  Component Value Date   TOTALPROTELP 5.3* 03/10/2015   ALBUMINELP 3.2* 03/10/2015   A1GS 0.3 03/10/2015   A2GS 0.6 03/10/2015   BETS 0.3* 03/10/2015   BETA2SER 0.3 03/10/2015   GAMS 0.5* 03/10/2015   MSPIKE 0.23 09/22/2014   SPEI * 03/10/2015     Chemistry      Component Value Date/Time   NA 142 03/30/2015 0845   NA 143 03/26/2015 0455   NA 145 02/21/2013 0908   K 4.2 03/30/2015 0845   K 4.4 03/26/2015 0455   K 3.8 02/21/2013 0908   CL 103 03/30/2015 0845   CL 104 03/26/2015 0455   CO2 27 03/30/2015 0845   CO2 30 03/26/2015 0455   CO2 32* 02/21/2013 0908   BUN 11 03/30/2015 0845   BUN 18 03/26/2015 0455   BUN 19.5 02/21/2013 0908   CREATININE 1.0 03/30/2015 0845   CREATININE 0.99 03/26/2015 0455   CREATININE  1.5* 02/21/2013 0908      Component Value Date/Time   CALCIUM 7.6* 03/30/2015 0845   CALCIUM 9.4 03/26/2015 0455   CALCIUM 17.7 Repeated and Verified* 02/21/2013 0908   CALCIUM >15.0* 02/01/2013 1230   ALKPHOS 137* 03/30/2015 0845   ALKPHOS 184* 03/26/2015 0455   AST 25  03/30/2015 0845   AST 19 03/26/2015 0455   ALT 25 03/30/2015 0845   ALT 15* 03/26/2015 0455   BILITOT 0.90 03/30/2015 0845   BILITOT 1.1 03/26/2015 0455     Impression and Plan: Mark [name] is 44 year old African American male with relapsed kappa light chain myeloma. He did undergo stem cell transplant for his myeloma on September 11, 2013 at [facility].  He has had a hard time with VD-PACE. He was hospitalized last week with pancytopenia and febrile neutropenia.  His WBC count is 2.7 and he has had no more fevers. His calcium is also improved at 7.6.  His platelet count is down at 11 but he has had no episodes of bleeding, bruising or petechiae.  He denies feeling dehydrated or needing fluids today. We will continue him on Diflucan.  He also requested a refill on his combivent inhaler so we did this.  We will give him this week off from treatment. We will see him next Tuesday for labs and follow-up and if things have improved we will look at resuming treatment.  He has a follow-up appointment at [facility] on Wednesday.  Our goal is to get his disease under control and then hopefully send him for an allogenic stem cell transplant.  He knows to call with any questions or concerns. We can always see him sooner if need be.   Mark Bottom, NP 8/29/20169:27 AM

## 2015-04-01 ENCOUNTER — Other Ambulatory Visit: Payer: BLUE CROSS/BLUE SHIELD

## 2015-04-01 ENCOUNTER — Ambulatory Visit: Payer: BLUE CROSS/BLUE SHIELD

## 2015-04-01 LAB — SPEP & IFE WITH QIG
ALPHA-1-GLOBULIN: 0.5 g/dL — AB (ref 0.2–0.3)
Albumin ELP: 3.8 g/dL (ref 3.8–4.8)
Alpha-2-Globulin: 0.7 g/dL (ref 0.5–0.9)
BETA GLOBULIN: 0.4 g/dL (ref 0.4–0.6)
Beta 2: 0.3 g/dL (ref 0.2–0.5)
GAMMA GLOBULIN: 0.5 g/dL — AB (ref 0.8–1.7)
IgA: 15 mg/dL — ABNORMAL LOW (ref 68–379)
IgG (Immunoglobin G), Serum: 542 mg/dL — ABNORMAL LOW (ref 650–1600)
Total Protein, Serum Electrophoresis: 6.1 g/dL (ref 6.1–8.1)

## 2015-04-01 LAB — KAPPA/LAMBDA LIGHT CHAINS
Kappa free light chain: 42.2 mg/dL — ABNORMAL HIGH (ref 0.33–1.94)
Lambda Free Lght Chn: 0.03 mg/dL — ABNORMAL LOW (ref 0.57–2.63)

## 2015-04-03 ENCOUNTER — Ambulatory Visit: Payer: BLUE CROSS/BLUE SHIELD

## 2015-04-07 ENCOUNTER — Ambulatory Visit (HOSPITAL_BASED_OUTPATIENT_CLINIC_OR_DEPARTMENT_OTHER): Payer: BLUE CROSS/BLUE SHIELD | Admitting: Family

## 2015-04-07 ENCOUNTER — Encounter: Payer: Self-pay | Admitting: Family

## 2015-04-07 ENCOUNTER — Telehealth: Payer: Self-pay | Admitting: Hematology & Oncology

## 2015-04-07 ENCOUNTER — Ambulatory Visit: Payer: BLUE CROSS/BLUE SHIELD

## 2015-04-07 ENCOUNTER — Ambulatory Visit (HOSPITAL_BASED_OUTPATIENT_CLINIC_OR_DEPARTMENT_OTHER): Payer: BLUE CROSS/BLUE SHIELD

## 2015-04-07 VITALS — BP 125/83 | HR 98 | Temp 98.2°F | Resp 18 | Ht 71.0 in | Wt 203.0 lb

## 2015-04-07 DIAGNOSIS — C9000 Multiple myeloma not having achieved remission: Secondary | ICD-10-CM

## 2015-04-07 DIAGNOSIS — J181 Lobar pneumonia, unspecified organism: Secondary | ICD-10-CM

## 2015-04-07 DIAGNOSIS — Z9484 Stem cells transplant status: Secondary | ICD-10-CM

## 2015-04-07 DIAGNOSIS — J189 Pneumonia, unspecified organism: Secondary | ICD-10-CM

## 2015-04-07 LAB — CBC WITH DIFFERENTIAL (CANCER CENTER ONLY)
BASO#: 0 10*3/uL (ref 0.0–0.2)
BASO%: 0.4 % (ref 0.0–2.0)
EOS ABS: 0 10*3/uL (ref 0.0–0.5)
EOS%: 0.4 % (ref 0.0–7.0)
HEMATOCRIT: 30.4 % — AB (ref 38.7–49.9)
HEMOGLOBIN: 10.4 g/dL — AB (ref 13.0–17.1)
LYMPH#: 0.6 10*3/uL — AB (ref 0.9–3.3)
LYMPH%: 26.8 % (ref 14.0–48.0)
MCH: 30.4 pg (ref 28.0–33.4)
MCHC: 34.2 g/dL (ref 32.0–35.9)
MCV: 89 fL (ref 82–98)
MONO#: 0.5 10*3/uL (ref 0.1–0.9)
MONO%: 21.5 % — ABNORMAL HIGH (ref 0.0–13.0)
NEUT%: 50.9 % (ref 40.0–80.0)
NEUTROS ABS: 1.2 10*3/uL — AB (ref 1.5–6.5)
Platelets: 76 10*3/uL — ABNORMAL LOW (ref 145–400)
RBC: 3.42 10*6/uL — ABNORMAL LOW (ref 4.20–5.70)
RDW: 14.9 % (ref 11.1–15.7)
WBC: 2.3 10*3/uL — ABNORMAL LOW (ref 4.0–10.0)

## 2015-04-07 LAB — CMP (CANCER CENTER ONLY)
ALBUMIN: 4.1 g/dL (ref 3.3–5.5)
ALT(SGPT): 29 U/L (ref 10–47)
AST: 25 U/L (ref 11–38)
Alkaline Phosphatase: 100 U/L — ABNORMAL HIGH (ref 26–84)
BILIRUBIN TOTAL: 0.9 mg/dL (ref 0.20–1.60)
BUN, Bld: 11 mg/dL (ref 7–22)
CALCIUM: 8.2 mg/dL (ref 8.0–10.3)
CHLORIDE: 106 meq/L (ref 98–108)
CO2: 26 meq/L (ref 18–33)
Creat: 1.2 mg/dl (ref 0.6–1.2)
Glucose, Bld: 144 mg/dL — ABNORMAL HIGH (ref 73–118)
Potassium: 3.8 mEq/L (ref 3.3–4.7)
Sodium: 138 mEq/L (ref 128–145)
Total Protein: 6.8 g/dL (ref 6.4–8.1)

## 2015-04-07 NOTE — Telephone Encounter (Signed)
Pt in ofc today for me to fax his  [employer] CLAIM forms to:  Claim: [id number] Attn: [facility]  F: [phone]        COPY SCANNED

## 2015-04-07 NOTE — Telephone Encounter (Signed)
Pt in ofc today for me to fax his [place] forms to:  Claim: [id number]  F: Donald SCANNED

## 2015-04-07 NOTE — Progress Notes (Signed)
Hematology and Oncology Follow Up Visit  Eitan Doubleday [id number] 1970-12-12 44 y.o. 04/07/2015   Principle Diagnosis:  Relapsed kappa light chain myeloma - July 2016  - transplant at [facility] in February 2015   Current Therapy:   Salvage VD-PACE q 21 days Zometa 4 mg IV every month    Interim History: Mr. Sur is here today for follow-up. We help off treating him last week due to his low platelet count. He is still having some fatigue but overall is feeling better. His lab work is improving. His platelet count is now 76. WBC count 2.3 but no problem with infections.    He missed his appointment with [facility] due to his car breaking down. He has spoken with them and will reschedule his appointment after his treatment today.  He deniesfever, chills, n/v, cough, rash, headache, blurred vision, chest pain, palpitations, abdominal pain, changes in bowel or bladder habits. He has had some SOB with exertion that passes when he takes a break to rest.  No lymphadenopathy found on exam.  No swelling in his extremities. 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 His back pain is better. He still has some aching in his hips and knees. No new pain.  His appetite comes and goes but he is eating. He is staying well hydrated. His weight is stable.   Medications:    Medication List       This list is accurate as of: 04/07/15 10:55 AM.  Always use your most recent med list.               bisacodyl 5 MG EC tablet  Commonly known as:  DULCOLAX  Take 5 mg by mouth daily as needed for moderate constipation (constipation).     famciclovir 500 MG tablet  Commonly known as:  FAMVIR  Take 1 tablet (500 mg total) by mouth daily.     fentaNYL 75 MCG/HR  Commonly known as:  DURAGESIC - dosed mcg/hr  Place 1 patch (75 mcg total) onto the skin every 3 (three) days.     fluconazole 100 MG tablet  Commonly known as:  DIFLUCAN  Take 1 tablet (100 mg total) by mouth daily.     gabapentin  300 MG capsule  Commonly known as:  NEURONTIN  Take 2 capsules (600 mg total) by mouth 3 (three) times daily.     HYDROmorphone 4 MG tablet  Commonly known as:  DILAUDID  Take 1-2, IF NEEDED, for pain every 4 hrs for pain     Ipratropium-Albuterol 20-100 MCG/ACT Aers respimat  Commonly known as:  COMBIVENT  Inhale 1 puff into the lungs every 6 (six) hours.     lidocaine-prilocaine cream  Commonly known as:  EMLA  Apply to affected area once     LORazepam 0.5 MG tablet  Commonly known as:  ATIVAN  Take 1 tablet (0.5 mg total) by mouth every 6 (six) hours as needed (Nausea or vomiting).     metoCLOPramide 10 MG tablet  Commonly known as:  REGLAN  Take 1 tablet (10 mg total) by mouth every 6 (six) hours as needed for nausea or vomiting.     ondansetron 8 MG disintegrating tablet  Commonly known as:  ZOFRAN ODT  Take 1 tablet (8 mg total) by mouth every 8 (eight) hours as needed for nausea or vomiting.     pantoprazole 40 MG tablet  Commonly known as:  PROTONIX  Take 1 tablet (40 mg total) by mouth  daily.     prochlorperazine 10 MG tablet  Commonly known as:  COMPAZINE  Take 1 tablet (10 mg total) by mouth every 6 (six) hours as needed (Nausea or vomiting).     saccharomyces boulardii 250 MG capsule  Commonly known as:  FLORASTOR  Take 1 capsule (250 mg total) by mouth 2 (two) times daily.     temazepam 22.5 MG capsule  Commonly known as:  RESTORIL  Take 1 capsule (22.5 mg total) by mouth at bedtime as needed for sleep.        Allergies:  Allergies  Allergen Reactions  . Gadolinium Derivatives Nausea And Vomiting    Pt became very nauseous and got sick.     Past Medical History, Surgical history, Social history, and Family History were reviewed and updated.  Review of Systems: All other 10 point review of systems is negative.   Physical Exam:  vitals were not taken for this visit.  Wt Readings from Last 3 Encounters:  03/30/15 204 lb (92.534 kg)  03/17/15  181 lb (82.101 kg)  02/27/15 198 lb (89.812 kg)    Ocular: Sclerae unicteric, pupils equal, round and reactive to light Ear-nose-throat: Oropharynx clear, dentition fair Lymphatic: No cervical or supraclavicular adenopathy Lungs no rales or rhonchi, good excursion bilaterally Heart regular rate and rhythm, no murmur appreciated Abd soft, nontender, positive bowel sounds MSK no focal spinal tenderness, no joint edema Neuro: non-focal, well-oriented, appropriate affect  Lab Results  Component Value Date   WBC 2.7* 03/30/2015   HGB 10.0* 03/30/2015   HCT 29.1* 03/30/2015   MCV 89 03/30/2015   PLT 11* 03/30/2015   No results found for: FERRITIN, IRON, TIBC, UIBC, IRONPCTSAT Lab Results  Component Value Date   RBC 3.26* 03/30/2015   Lab Results  Component Value Date   KPAFRELGTCHN 42.20* 03/30/2015   LAMBDASER 0.03* 03/30/2015   KAPLAMBRATIO * 03/30/2015   Lab Results  Component Value Date   IGGSERUM 542* 03/30/2015   IGA 15* 03/30/2015   IGMSERUM <5* 03/30/2015   Lab Results  Component Value Date   TOTALPROTELP 6.1 03/30/2015   ALBUMINELP 3.8 03/30/2015   A1GS 0.5* 03/30/2015   A2GS 0.7 03/30/2015   BETS 0.4 03/30/2015   BETA2SER 0.3 03/30/2015   GAMS 0.5* 03/30/2015   MSPIKE 0.23 09/22/2014   SPEI * 03/30/2015     Chemistry      Component Value Date/Time   NA 142 03/30/2015 0845   NA 143 03/26/2015 0455   NA 145 02/21/2013 0908   K 4.2 03/30/2015 0845   K 4.4 03/26/2015 0455   K 3.8 02/21/2013 0908   CL 103 03/30/2015 0845   CL 104 03/26/2015 0455   CO2 27 03/30/2015 0845   CO2 30 03/26/2015 0455   CO2 32* 02/21/2013 0908   BUN 11 03/30/2015 0845   BUN 18 03/26/2015 0455   BUN 19.5 02/21/2013 0908   CREATININE 1.0 03/30/2015 0845   CREATININE 0.99 03/26/2015 0455   CREATININE 1.5* 02/21/2013 0908      Component Value Date/Time   CALCIUM 7.6* 03/30/2015 0845   CALCIUM 9.4 03/26/2015 0455   CALCIUM 17.7 Repeated and Verified* 02/21/2013 0908    CALCIUM >15.0* 02/01/2013 1230   ALKPHOS 137* 03/30/2015 0845   ALKPHOS 184* 03/26/2015 0455   AST 25 03/30/2015 0845   AST 19 03/26/2015 0455   ALT 25 03/30/2015 0845   ALT 15* 03/26/2015 0455   BILITOT 0.90 03/30/2015 0845   BILITOT 1.1 03/26/2015 0455  Impression and Plan: Mr. Difatta is 44 year old African American male with relapsed kappa light chain myeloma. He did undergo stem cell transplant for his myeloma on September 11, 2013 at [facility].  His labs are improving. His platelet count is now up to 76. He has had no episodes of bleeding. He has had some mild fatigue but is feeling much better.  We will admit him tomorrow for chemotherapy.  He will be rescheduling his appointment with [facility] after this treatment.  Our goal is still to get his disease under control and then send him for an allogenic stem cell transplant.  He knows to call with any questions or concerns. We can always see him sooner if need be.   [name], NP 9/6/201610:55 AM

## 2015-04-08 ENCOUNTER — Inpatient Hospital Stay (HOSPITAL_COMMUNITY): Payer: BLUE CROSS/BLUE SHIELD

## 2015-04-08 ENCOUNTER — Encounter (HOSPITAL_COMMUNITY): Payer: Self-pay

## 2015-04-08 ENCOUNTER — Other Ambulatory Visit: Payer: BLUE CROSS/BLUE SHIELD

## 2015-04-08 ENCOUNTER — Other Ambulatory Visit: Payer: Self-pay | Admitting: Hematology & Oncology

## 2015-04-08 ENCOUNTER — Inpatient Hospital Stay (HOSPITAL_COMMUNITY)
Admission: AD | Admit: 2015-04-08 | Discharge: 2015-04-14 | DRG: 847 | Disposition: A | Discharge status: Home / Self Care | Payer: BLUE CROSS/BLUE SHIELD | Source: Ambulatory Visit | Attending: Hematology & Oncology | Admitting: Hematology & Oncology

## 2015-04-08 ENCOUNTER — Ambulatory Visit: Payer: BLUE CROSS/BLUE SHIELD

## 2015-04-08 DIAGNOSIS — K219 Gastro-esophageal reflux disease without esophagitis: Secondary | ICD-10-CM | POA: Diagnosis present

## 2015-04-08 DIAGNOSIS — R739 Hyperglycemia, unspecified: Secondary | ICD-10-CM | POA: Diagnosis not present

## 2015-04-08 DIAGNOSIS — Z09 Encounter for follow-up examination after completed treatment for conditions other than malignant neoplasm: Secondary | ICD-10-CM

## 2015-04-08 DIAGNOSIS — R112 Nausea with vomiting, unspecified: Secondary | ICD-10-CM | POA: Diagnosis not present

## 2015-04-08 DIAGNOSIS — Z79899 Other long term (current) drug therapy: Secondary | ICD-10-CM

## 2015-04-08 DIAGNOSIS — Z9481 Bone marrow transplant status: Secondary | ICD-10-CM | POA: Diagnosis not present

## 2015-04-08 DIAGNOSIS — G629 Polyneuropathy, unspecified: Secondary | ICD-10-CM | POA: Diagnosis present

## 2015-04-08 DIAGNOSIS — Z5112 Encounter for antineoplastic immunotherapy: Secondary | ICD-10-CM | POA: Diagnosis not present

## 2015-04-08 DIAGNOSIS — Z87891 Personal history of nicotine dependence: Secondary | ICD-10-CM

## 2015-04-08 DIAGNOSIS — G893 Neoplasm related pain (acute) (chronic): Secondary | ICD-10-CM | POA: Diagnosis not present

## 2015-04-08 DIAGNOSIS — Z923 Personal history of irradiation: Secondary | ICD-10-CM

## 2015-04-08 DIAGNOSIS — Z5111 Encounter for antineoplastic chemotherapy: Principal | ICD-10-CM

## 2015-04-08 DIAGNOSIS — T380X5A Adverse effect of glucocorticoids and synthetic analogues, initial encounter: Secondary | ICD-10-CM | POA: Diagnosis not present

## 2015-04-08 DIAGNOSIS — D649 Anemia, unspecified: Secondary | ICD-10-CM | POA: Diagnosis not present

## 2015-04-08 DIAGNOSIS — D61818 Other pancytopenia: Secondary | ICD-10-CM | POA: Diagnosis present

## 2015-04-08 DIAGNOSIS — C9000 Multiple myeloma not having achieved remission: Secondary | ICD-10-CM

## 2015-04-08 DIAGNOSIS — C9002 Multiple myeloma in relapse: Secondary | ICD-10-CM | POA: Diagnosis not present

## 2015-04-08 DIAGNOSIS — Z91048 Other nonmedicinal substance allergy status: Secondary | ICD-10-CM

## 2015-04-08 DIAGNOSIS — D696 Thrombocytopenia, unspecified: Secondary | ICD-10-CM | POA: Diagnosis not present

## 2015-04-08 LAB — COMPREHENSIVE METABOLIC PANEL
ALT: 24 U/L (ref 17–63)
ANION GAP: 9 (ref 5–15)
AST: 29 U/L (ref 15–41)
Albumin: 4.1 g/dL (ref 3.5–5.0)
Alkaline Phosphatase: 95 U/L (ref 38–126)
BUN: 8 mg/dL (ref 6–20)
CHLORIDE: 107 mmol/L (ref 101–111)
CO2: 24 mmol/L (ref 22–32)
Calcium: 7.9 mg/dL — ABNORMAL LOW (ref 8.9–10.3)
Creatinine, Ser: 1.08 mg/dL (ref 0.61–1.24)
Glucose, Bld: 107 mg/dL — ABNORMAL HIGH (ref 65–99)
Potassium: 3.9 mmol/L (ref 3.5–5.1)
SODIUM: 140 mmol/L (ref 135–145)
Total Bilirubin: 1 mg/dL (ref 0.3–1.2)
Total Protein: 6.4 g/dL — ABNORMAL LOW (ref 6.5–8.1)

## 2015-04-08 LAB — CBC
HCT: 27.6 % — ABNORMAL LOW (ref 39.0–52.0)
Hemoglobin: 9.7 g/dL — ABNORMAL LOW (ref 13.0–17.0)
MCH: 30.7 pg (ref 26.0–34.0)
MCHC: 35.1 g/dL (ref 30.0–36.0)
MCV: 87.3 fL (ref 78.0–100.0)
PLATELETS: 71 10*3/uL — AB (ref 150–400)
RBC: 3.16 MIL/uL — ABNORMAL LOW (ref 4.22–5.81)
RDW: 15.4 % (ref 11.5–15.5)
WBC: 3.1 10*3/uL — ABNORMAL LOW (ref 4.0–10.5)

## 2015-04-08 MED ORDER — SACCHAROMYCES BOULARDII 250 MG PO CAPS
250.0000 mg | ORAL_CAPSULE | Freq: Two times a day (BID) | ORAL | Status: DC
  Administered 2015-04-08 – 2015-04-14 (×12): 250 mg via ORAL
  Filled 2015-04-08 (×12): qty 1

## 2015-04-08 MED ORDER — GABAPENTIN 300 MG PO CAPS
600.0000 mg | ORAL_CAPSULE | Freq: Three times a day (TID) | ORAL | Status: DC
  Administered 2015-04-08 – 2015-04-14 (×17): 600 mg via ORAL
  Filled 2015-04-08 (×17): qty 2

## 2015-04-08 MED ORDER — FAMCICLOVIR 500 MG PO TABS
500.0000 mg | ORAL_TABLET | Freq: Every day | ORAL | Status: DC
  Administered 2015-04-08 – 2015-04-14 (×7): 500 mg via ORAL
  Filled 2015-04-08 (×8): qty 1

## 2015-04-08 MED ORDER — FENTANYL 75 MCG/HR TD PT72
75.0000 ug | MEDICATED_PATCH | TRANSDERMAL | Status: DC
  Administered 2015-04-11: 75 ug via TRANSDERMAL
  Filled 2015-04-08 (×2): qty 1

## 2015-04-08 MED ORDER — FLUCONAZOLE 100 MG PO TABS
100.0000 mg | ORAL_TABLET | Freq: Every day | ORAL | Status: DC
  Administered 2015-04-08 – 2015-04-14 (×7): 100 mg via ORAL
  Filled 2015-04-08 (×7): qty 1

## 2015-04-08 MED ORDER — SODIUM CHLORIDE 0.9 % IJ SOLN: 10.0000 mL | INTRAMUSCULAR | Status: DC | PRN

## 2015-04-08 MED ORDER — IPRATROPIUM-ALBUTEROL 20-100 MCG/ACT IN AERS: 1.0000 | INHALATION_SPRAY | Freq: Four times a day (QID) | RESPIRATORY_TRACT | Status: DC

## 2015-04-08 MED ORDER — PROCHLORPERAZINE MALEATE 10 MG PO TABS: 10.0000 mg | ORAL_TABLET | Freq: Four times a day (QID) | ORAL | Status: DC | PRN

## 2015-04-08 MED ORDER — SODIUM CHLORIDE 0.9 % IJ SOLN: 3.0000 mL | INTRAMUSCULAR | Status: DC | PRN

## 2015-04-08 MED ORDER — BISACODYL 5 MG PO TBEC
5.0000 mg | DELAYED_RELEASE_TABLET | Freq: Every day | ORAL | Status: DC | PRN
  Administered 2015-04-09 – 2015-04-13 (×5): 5 mg via ORAL
  Filled 2015-04-08 (×6): qty 1

## 2015-04-08 MED ORDER — ENOXAPARIN SODIUM 60 MG/0.6ML ~~LOC~~ SOLN
50.0000 mg | SUBCUTANEOUS | Status: DC
  Administered 2015-04-08: 50 mg via SUBCUTANEOUS
  Filled 2015-04-08 (×2): qty 0.6

## 2015-04-08 MED ORDER — IPRATROPIUM-ALBUTEROL 0.5-2.5 (3) MG/3ML IN SOLN
3.0000 mL | Freq: Four times a day (QID) | RESPIRATORY_TRACT | Status: DC
  Filled 2015-04-08: qty 3

## 2015-04-08 MED ORDER — IPRATROPIUM-ALBUTEROL 0.5-2.5 (3) MG/3ML IN SOLN: 3.0000 mL | Freq: Four times a day (QID) | RESPIRATORY_TRACT | Status: DC | PRN

## 2015-04-08 MED ORDER — LORAZEPAM 0.5 MG PO TABS: 0.5000 mg | ORAL_TABLET | Freq: Four times a day (QID) | ORAL | Status: DC | PRN

## 2015-04-08 MED ORDER — METOCLOPRAMIDE HCL 10 MG PO TABS: 10.0000 mg | ORAL_TABLET | Freq: Four times a day (QID) | ORAL | Status: DC | PRN

## 2015-04-08 MED ORDER — HYDROMORPHONE HCL 4 MG PO TABS
4.0000 mg | ORAL_TABLET | ORAL | Status: DC | PRN
  Filled 2015-04-08: qty 2

## 2015-04-08 MED ORDER — TEMAZEPAM 7.5 MG PO CAPS: 22.5000 mg | ORAL_CAPSULE | Freq: Every evening | ORAL | Status: DC | PRN

## 2015-04-08 MED ORDER — ONDANSETRON 4 MG PO TBDP
8.0000 mg | ORAL_TABLET | Freq: Three times a day (TID) | ORAL | Status: DC | PRN
  Administered 2015-04-10: 8 mg via ORAL
  Filled 2015-04-08: qty 2

## 2015-04-08 MED ORDER — HEPARIN SOD (PORK) LOCK FLUSH 100 UNIT/ML IV SOLN: 500.0000 [IU] | Freq: Once | INTRAVENOUS | Status: DC | PRN

## 2015-04-08 MED ORDER — ALTEPLASE 2 MG IJ SOLR
2.0000 mg | Freq: Once | INTRAMUSCULAR | Status: DC | PRN
  Filled 2015-04-08: qty 2

## 2015-04-08 MED ORDER — SODIUM CHLORIDE 0.9 % IV SOLN
INTRAVENOUS | Status: DC
  Administered 2015-04-08: 13:00:00 via INTRAVENOUS

## 2015-04-08 MED ORDER — PANTOPRAZOLE SODIUM 40 MG PO TBEC
40.0000 mg | DELAYED_RELEASE_TABLET | Freq: Every day | ORAL | Status: DC
  Administered 2015-04-08 – 2015-04-14 (×7): 40 mg via ORAL
  Filled 2015-04-08 (×7): qty 1

## 2015-04-08 MED ORDER — HEPARIN SOD (PORK) LOCK FLUSH 100 UNIT/ML IV SOLN: 250.0000 [IU] | Freq: Once | INTRAVENOUS | Status: DC | PRN

## 2015-04-08 NOTE — H&P (Addendum)
[place]  [phone]    ADMISSION NOTE  Admitting MD: Burney Gauze, MD  Attending MD: Burney Gauze, MD   HPI: Mark [name] is an 44 y.o. 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  Denies fevers, chills, night sweats, vision changes, or mucositis. He reports mild fatigue. Denies any respiratory complaints. Denies any chest pain or palpitations. Denies lower extremity swelling. Denies nausea, heartburn or change in bowel habits. Denies abdominal pain. Appetite is normal. Denies any dysuria. Denies abnormal skin rashes. Neuropathy is unchanged. Denies any bleeding issues such as epistaxis, hematemesis, hematuria or hematochezia. Ambulating without difficulty.  Principle Diagnosis:  Relapsed kappa light chain myeloma - July 2016 - transplant at [facility] in February 2015   Current Therapy:  Salvage VD-PACE q 21 days, s/p C2 Zometa 4 mg IV every month, last on 8/2   PMH:  Past Medical History  Diagnosis Date  . History of radiation therapy 02/07/13- 02/26/13    lower L spine, upper sacrum, 35 gray in 14 fractions  . FH: chemotherapy     Dr. Burney Gauze  . Cancer   . Multiple myeloma dx'd 01/2013  . GERD (gastroesophageal reflux disease)     Surgeries:  Past Surgical History  Procedure Laterality Date  . Portacath placement    . Bone marrow transplant  09/12/13    [facility]  . Kyphoplasty  05/20/14  . Lumbar laminectomy/decompression microdiscectomy N/A 09/25/2014    Procedure: LUMBAR LAMINECTOMY/DECOMPRESSION MICRODISCECTOMY;  Surgeon: Sinclair Ship, MD;  Location: [place];  Service: Orthopedics;  Laterality: N/A;  Lumbar 4-5, L5-S1  decompression  . Esophagogastroduodenoscopy N/A 02/06/2015    Procedure: ESOPHAGOGASTRODUODENOSCOPY (EGD);  Surgeon: Teena Irani, MD;  Location: [facility];   Service: Endoscopy;  Laterality: N/A;    Allergies:  Allergies  Allergen Reactions  . Gadolinium Derivatives Nausea And Vomiting    Pt became very nauseous and got sick.     Medications:   Prior to Admission:  Prescriptions prior to admission  Medication Sig Dispense Refill Last Dose  . bisacodyl (DULCOLAX) 5 MG EC tablet Take 5 mg by mouth daily as needed for moderate constipation (constipation).    04/07/2015 at Unknown time  . famciclovir (FAMVIR) 500 MG tablet Take 1 tablet (500 mg total) by mouth daily. 30 tablet 12 04/07/2015 at Unknown time  . fentaNYL (DURAGESIC - DOSED MCG/HR) 75 MCG/HR Place 1 patch (75 mcg total) onto the skin every 3 (three) days. 10 patch 0 04/08/2015 at Unknown time  . fluconazole (DIFLUCAN) 100 MG tablet Take 1 tablet (100 mg total) by mouth daily. 30 tablet 2 04/07/2015 at Unknown time  . gabapentin (NEURONTIN) 300 MG capsule Take 2 capsules (600 mg total) by mouth 3 (three) times daily. 180 capsule 3 04/07/2015 at Unknown time  . HYDROmorphone (DILAUDID) 4 MG tablet Take 1-2, IF NEEDED, for pain every 4 hrs for pain (Patient taking differently: Take 4-8 mg by mouth every 4 (four) hours as needed for moderate pain or severe pain. ) 120 tablet 0 04/07/2015 at Unknown time  . Ipratropium-Albuterol (COMBIVENT) 20-100 MCG/ACT AERS respimat Inhale 1 puff into the lungs every 6 (six) hours. 1 Inhaler 2 04/07/2015 at Unknown time  . LORazepam (ATIVAN) 0.5 MG tablet Take 1 tablet (0.5 mg total) by mouth every 6 (six) hours as needed (Nausea or vomiting). 30 tablet 0  Past Week at Unknown time  . metoCLOPramide (REGLAN) 10 MG tablet Take 1 tablet (10 mg total) by mouth every 6 (six) hours as needed for nausea or vomiting. 20 tablet 0 Past Week at Unknown time  . ondansetron (ZOFRAN ODT) 8 MG disintegrating tablet Take 1 tablet (8 mg total) by mouth every 8 (eight) hours as needed for nausea or vomiting. 30 tablet 2 Past Week at Unknown time  . pantoprazole (PROTONIX) 40 MG tablet  Take 1 tablet (40 mg total) by mouth daily. 30 tablet 5 04/07/2015 at Unknown time  . PRESCRIPTION MEDICATION Chemo at [facility].   03/10/2015  . prochlorperazine (COMPAZINE) 10 MG tablet Take 1 tablet (10 mg total) by mouth every 6 (six) hours as needed (Nausea or vomiting). 30 tablet 1 Past Week at Unknown time  . saccharomyces boulardii (FLORASTOR) 250 MG capsule Take 1 capsule (250 mg total) by mouth 2 (two) times daily. 15 capsule 0 04/07/2015 at Unknown time  . temazepam (RESTORIL) 22.5 MG capsule Take 1 capsule (22.5 mg total) by mouth at bedtime as needed for sleep. 30 capsule 0 04/07/2015 at Unknown time  . lidocaine-prilocaine (EMLA) cream Apply to affected area once 30 g 3 Taking    Scheduled Meds: . enoxaparin (LOVENOX) injection  50 mg Subcutaneous Q24H  . famciclovir  500 mg Oral Daily  . [START ON 04/11/2015] fentaNYL  75 mcg Transdermal Q72H  . fluconazole  100 mg Oral Daily  . gabapentin  600 mg Oral TID  . ipratropium-albuterol  3 mL Nebulization Q6H  . pantoprazole  40 mg Oral Daily  . saccharomyces boulardii  250 mg Oral BID   Continuous Infusions: . sodium chloride 75 mL/hr at 04/08/15 1323   PRN Meds:.alteplase, bisacodyl, heparin lock flush, heparin lock flush, HYDROmorphone, LORazepam, metoCLOPramide, ondansetron, prochlorperazine, sodium chloride, sodium chloride, temazepam  Review of Systems:  See HPI for significant positives, rest of ROS is negative  Family History:  Family History  Problem Relation Age of Onset  . Diabetic kidney disease Mother   . Hypertension Mother   . Heart attack Mother   . Kidney failure Mother   . Coronary artery disease Mother   . HIV Father     Social History:  reports that he quit smoking about 11 years ago. His smoking use included Cigarettes. He started smoking about 26 years ago. He has a 15 pack-year smoking history. He has never used smokeless tobacco. He reports that he does not drink alcohol or use illicit drugs.  Physical Exam:    Filed Vitals:   04/08/15 1254  BP: 141/87  Pulse: 110  Temp: 99.9 F (37.7 C)  Resp: 20   Filed Weights   04/08/15 1254  Weight: 203 lb (92.08 kg)    GENERAL:alert, no distress and comfortable SKIN: skin color, texture, turgor are normal, no rashes or significant lesions EYES: normal, conjunctiva are pink and non-injected, sclera clear OROPHARYNX:no exudate, no erythema and lips, buccal mucosa, and tongue normal  NECK: supple, thyroid normal size, non-tender, without nodularity LYMPH:  no palpable lymphadenopathy in the cervical, axillary or inguinal LUNGS: clear to auscultation and percussion with normal breathing effort HEART: regular rate & rhythm and no murmurs and no lower extremity edema. Right port normal ABDOMEN: soft, non-tender and normal bowel sounds Musculoskeletal:no cyanosis of digits and no clubbing  PSYCH: alert & oriented x 3 with fluent speech NEURO: no focal motor/sensory deficits    LABS: CBC   Recent Labs Lab 04/07/15 1112  WBC  2.3*  HGB 10.4*  HCT 30.4*  PLT 76*  MCV 89  MCH 30.4  MCHC 34.2  RDW 14.9  LYMPHSABS 0.6*  EOSABS 0.0  BASOSABS 0.0     CMP    Recent Labs Lab 04/07/15 1113  NA 138  K 3.8  CL 106  CO2 26  GLUCOSE 144*  BUN 11  CREATININE 1.2  CALCIUM 8.2  AST 25  ALT 29  ALKPHOS 100*  BILITOT 0.90        Component Value Date/Time   BILITOT 0.90 04/07/2015 1113   BILITOT 1.1 03/26/2015 0455   BILIDIR 0.2 03/01/2015 1400   IBILI 1.3* 03/01/2015 1400    No results for input(s): LIPASE, AMYLASE in the last 168 hours. No results for input(s): AMMONIA in the last 168 hours.   No results for input(s): INR, PROTIME in the last 168 hours.  No results for input(s): DDIMER in the last 72 hours.   Urinalysis    Component Value Date/Time   COLORURINE YELLOW 03/14/2015 0348   APPEARANCEUR CLEAR 03/14/2015 0348   LABSPEC 1.024 03/14/2015 0348   PHURINE 6.5 03/14/2015 0348   GLUCOSEU NEGATIVE 03/14/2015  0348   HGBUR NEGATIVE 03/14/2015 0348   BILIRUBINUR NEGATIVE 03/14/2015 0348   KETONESUR NEGATIVE 03/14/2015 0348   PROTEINUR 30* 03/14/2015 0348   UROBILINOGEN 0.2 03/14/2015 0348   NITRITE NEGATIVE 03/14/2015 0348   LEUKOCYTESUR NEGATIVE 03/14/2015 0348     Imaging Studies: X-ray Chest Pa And Lateral  04/08/2015   CLINICAL DATA:  History of multiple myeloma, followup chemotherapy examination  EXAM: CHEST - 2 VIEW  COMPARISON:  03/14/2015  FINDINGS: Cardiac shadow is stable. A right-sided chest wall port is again identified in stable position. Lungs are well aerated bilaterally. Rib lesions are again identified particularly in the anterior aspect of the left fourth rib with a pleural based mass lesion identified. Is better visualized on the current examination but relatively stable from a prior CT of July of 2016. No new focal infiltrate is seen. Stable compression deformity of T12 is noted.  IMPRESSION: No significant interval change from the prior exam.   Electronically Signed   By: Inez Catalina M.D.   On: 04/08/2015 13:16   Dg Chest 2 View  03/14/2015   CLINICAL DATA:  Shortness breath.  Chest pain.  Multiple myeloma.  EXAM: CHEST - 2 VIEW  COMPARISON:  Two-view chest x-ray 03/06/2015  FINDINGS: Heart size is normal. A right IJ Port-A-Cath is stable. The lung volumes are somewhat low. No focal airspace disease is present. There is no edema or effusion to suggest failure. The visualized soft tissues are within normal limits. Focal kyphosis in the lower thoracic spine is stable.  IMPRESSION: 1. No acute cardiopulmonary disease or significant interval change. 2. Persistent low lung volumes. 3. Stable right IJ Port-A-Cath.   Electronically Signed   By: San Morelle M.D.   On: 03/14/2015 02:46     Assessment and Plan: 44 y.o. male with  Kappa light chain  multiple myeloma  status post previous autologous cell transplantation at [facility] in January 2015 With relapsed within a  year, despite post transplant maintenance therapy He was on Daratumumab with Velcade and Decadron and received VD-PACE on 8/3 to 8/4. He continued shingles prophylaxis All myeloma treatment was placed on hold while pancytopenic. His counts are now improving for he was admitted for chemotherapy. He will receive Cycle 2 Last Zometa given on 8/2  Pancytopenia In the setting of malignancy  This is stable, improving, will continue to monitor while in hospital.  Last Neupogen given on 8/25  DVT prophylaxis On Lovenox  Full Code.   [facility] E 04/08/2015 2:25 PM   ADDENDUM:  I agree with the above.  We will probably start the chemotherapy tomorrow.  I'm not sure what is available at [facility]. Hopefully, they will be able to help him out.  I thought about trying to find a protocol that uses CAR-T cell therapy.  We will watch his blood counts. He may need to be transfused.  I'll see about Zometa. We will see about his calcium. Hypercalcemia for him is a clear indicator of disease activity.  Lum Keas

## 2015-04-09 LAB — CBC
HCT: 26.3 % — ABNORMAL LOW (ref 39.0–52.0)
Hemoglobin: 9.1 g/dL — ABNORMAL LOW (ref 13.0–17.0)
MCH: 30.2 pg (ref 26.0–34.0)
MCHC: 34.6 g/dL (ref 30.0–36.0)
MCV: 87.4 fL (ref 78.0–100.0)
Platelets: 68 10*3/uL — ABNORMAL LOW (ref 150–400)
RBC: 3.01 MIL/uL — ABNORMAL LOW (ref 4.22–5.81)
RDW: 15.5 % (ref 11.5–15.5)
WBC: 2 10*3/uL — ABNORMAL LOW (ref 4.0–10.5)

## 2015-04-09 LAB — SPEP & IFE WITH QIG
ALBUMIN ELP: 4.4 g/dL (ref 3.8–4.8)
ALPHA-1-GLOBULIN: 0.3 g/dL (ref 0.2–0.3)
ALPHA-2-GLOBULIN: 0.7 g/dL (ref 0.5–0.9)
BETA 2: 0.3 g/dL (ref 0.2–0.5)
BETA GLOBULIN: 0.4 g/dL (ref 0.4–0.6)
GAMMA GLOBULIN: 0.5 g/dL — AB (ref 0.8–1.7)
IGA: 12 mg/dL — AB (ref 68–379)
IGG (IMMUNOGLOBIN G), SERUM: 480 mg/dL — AB (ref 650–1600)
IgM, Serum: 5 mg/dL — ABNORMAL LOW (ref 41–251)
TOTAL PROTEIN, SERUM ELECTROPHOR: 6.6 g/dL (ref 6.1–8.1)

## 2015-04-09 LAB — URINALYSIS, ROUTINE W REFLEX MICROSCOPIC
Bilirubin Urine: NEGATIVE
GLUCOSE, UA: 250 mg/dL — AB
HGB URINE DIPSTICK: NEGATIVE
KETONES UR: NEGATIVE mg/dL
LEUKOCYTES UA: NEGATIVE
Nitrite: NEGATIVE
PROTEIN: NEGATIVE mg/dL
Specific Gravity, Urine: 1.012 (ref 1.005–1.030)
UROBILINOGEN UA: 0.2 mg/dL (ref 0.0–1.0)
pH: 7 (ref 5.0–8.0)

## 2015-04-09 LAB — COMPREHENSIVE METABOLIC PANEL WITH GFR
ALT: 23 U/L (ref 17–63)
AST: 20 U/L (ref 15–41)
Albumin: 3.9 g/dL (ref 3.5–5.0)
Alkaline Phosphatase: 85 U/L (ref 38–126)
Anion gap: 6 (ref 5–15)
BUN: 9 mg/dL (ref 6–20)
CO2: 25 mmol/L (ref 22–32)
Calcium: 7.7 mg/dL — ABNORMAL LOW (ref 8.9–10.3)
Chloride: 111 mmol/L (ref 101–111)
Creatinine, Ser: 0.99 mg/dL (ref 0.61–1.24)
GFR calc Af Amer: >60 mL/min
GFR calc non Af Amer: >60 mL/min
Glucose, Bld: 106 mg/dL — ABNORMAL HIGH (ref 65–99)
Potassium: 3.9 mmol/L (ref 3.5–5.1)
Sodium: 142 mmol/L (ref 135–145)
Total Bilirubin: 0.8 mg/dL (ref 0.3–1.2)
Total Protein: 5.7 g/dL — ABNORMAL LOW (ref 6.5–8.1)

## 2015-04-09 MED ORDER — SODIUM BICARBONATE/SODIUM CHLORIDE MOUTHWASH
Freq: Four times a day (QID) | OROMUCOSAL | Status: DC
  Administered 2015-04-09 – 2015-04-14 (×19): via OROMUCOSAL
  Filled 2015-04-09: qty 1000

## 2015-04-09 MED ORDER — SODIUM CHLORIDE 0.9 % IJ SOLN: 10.0000 mL | INTRAMUSCULAR | Status: DC | PRN

## 2015-04-09 MED ORDER — POTASSIUM CHLORIDE 2 MEQ/ML IV SOLN
Freq: Once | INTRAVENOUS | Status: AC
  Administered 2015-04-09: 11:00:00 via INTRAVENOUS
  Filled 2015-04-09: qty 10

## 2015-04-09 MED ORDER — SODIUM CHLORIDE 0.9 % IV SOLN
Freq: Once | INTRAVENOUS | Status: AC
  Administered 2015-04-09: 11:00:00 via INTRAVENOUS
  Filled 2015-04-09: qty 5

## 2015-04-09 MED ORDER — HOT PACK MISC ONCOLOGY: 1.0000 | Freq: Once | Status: DC | PRN

## 2015-04-09 MED ORDER — SODIUM CHLORIDE 0.9 % IV SOLN
Freq: Once | INTRAVENOUS | Status: AC
  Administered 2015-04-09: 21 mg via INTRAVENOUS
  Filled 2015-04-09: qty 21

## 2015-04-09 MED ORDER — MORPHINE SULFATE (PF) 4 MG/ML IV SOLN
4.0000 mg | INTRAVENOUS | Status: DC | PRN
  Administered 2015-04-09 – 2015-04-10 (×3): 4 mg via INTRAVENOUS
  Filled 2015-04-09 (×3): qty 1

## 2015-04-09 MED ORDER — BIOTENE DRY MOUTH MT LIQD
15.0000 mL | Freq: Four times a day (QID) | OROMUCOSAL | Status: DC
  Administered 2015-04-09 – 2015-04-14 (×17): 15 mL via OROMUCOSAL

## 2015-04-09 MED ORDER — COLD PACK MISC ONCOLOGY: 1.0000 | Freq: Once | Status: DC | PRN

## 2015-04-09 MED ORDER — DEXAMETHASONE 4 MG PO TABS
40.0000 mg | ORAL_TABLET | Freq: Once | ORAL | Status: AC
  Administered 2015-04-09: 40 mg via ORAL
  Filled 2015-04-09 (×2): qty 10

## 2015-04-09 MED ORDER — SODIUM CHLORIDE 0.9 % IV SOLN
INTRAVENOUS | Status: DC
  Administered 2015-04-09: 10:00:00 via INTRAVENOUS

## 2015-04-09 MED ORDER — BORTEZOMIB CHEMO IV INJECTION 3.5 MG
1.3000 mg/m2 | Freq: Once | INTRAMUSCULAR | Status: AC
  Administered 2015-04-09: 2.8 mg via INTRAVENOUS
  Filled 2015-04-09: qty 2.8

## 2015-04-09 MED ORDER — PALONOSETRON HCL INJECTION 0.25 MG/5ML
0.2500 mg | Freq: Once | INTRAVENOUS | Status: AC
  Administered 2015-04-09: 0.25 mg via INTRAVENOUS
  Filled 2015-04-09: qty 5

## 2015-04-09 MED ORDER — SODIUM CHLORIDE 0.9 % IV SOLN
10.0000 mg/m2 | Freq: Once | INTRAVENOUS | Status: AC
  Administered 2015-04-09: 22 mg via INTRAVENOUS
  Filled 2015-04-09: qty 11

## 2015-04-09 NOTE — Progress Notes (Signed)
Peripherally Inserted Central Catheter/Midline Placement  The IV Nurse has discussed with the patient and/or persons authorized to consent for the patient, the purpose of this procedure and the potential benefits and risks involved with this procedure.  The benefits include less needle sticks, lab draws from the catheter and patient may be discharged home with the catheter.  Risks include, but not limited to, infection, bleeding, blood clot (thrombus formation), and puncture of an artery; nerve damage and irregular heat beat.  Alternatives to this procedure were also discussed.  PICC/Midline Placement Documentation        Mark [name] 04/09/2015, 9:53 AM

## 2015-04-09 NOTE — Progress Notes (Addendum)
Mr. Lafoy will start chemotherapy today. He'll get his PICC line placed this morning.  He feels okay. He is having some pain. We will have to give him some IV pain medication. IV morphine typically works pretty well.  His labs do not look too bad. His calcium is what I watch mostly. This actually is doing pretty good.  He does have some mild thrombocytopenia. I think this will always be an issue for him from the stem cell transplant. He is not bleeding. He has no bruises. As such, I think that we can just watch this for right now. I'm sure that after he gets his chemotherapy, he likely will need to be transfused.  His appetite is doing okay. He's had no nausea or vomiting.  He is not constipated. This typically can happen with him during his hospital stays.  [name], his vital signs are all stable. His lungs are clear. Cardiac exam regular rate and rhythm. Abdomen is soft. Has good bowel sounds. There is no fluid wave. There is no palpable liver or spleen tip. Extremities shows no clubbing, cyanosis or edema.  We will proceed with treatment.  We will make sure that he has mouth rinse. He will continue his antibiotic prophylaxis.  I will have to check his labs over the weekend.  As always, the staff on [address] has done a outstanding job taking care of him.  Lum Keas

## 2015-04-10 ENCOUNTER — Encounter: Payer: Self-pay | Admitting: Radiation Oncology

## 2015-04-10 LAB — GLUCOSE, CAPILLARY
GLUCOSE-CAPILLARY: 243 mg/dL — AB (ref 65–99)
GLUCOSE-CAPILLARY: 246 mg/dL — AB (ref 65–99)
Glucose-Capillary: 174 mg/dL — ABNORMAL HIGH (ref 65–99)
Glucose-Capillary: 277 mg/dL — ABNORMAL HIGH (ref 65–99)

## 2015-04-10 MED ORDER — INSULIN ASPART 100 UNIT/ML ~~LOC~~ SOLN
5.0000 [IU] | Freq: Once | SUBCUTANEOUS | Status: AC
Start: 2015-04-10 — End: 2015-04-10
  Administered 2015-04-10: 5 [IU] via SUBCUTANEOUS

## 2015-04-10 MED ORDER — POTASSIUM CHLORIDE 2 MEQ/ML IV SOLN
Freq: Once | INTRAVENOUS | Status: AC
  Administered 2015-04-10: 12:00:00 via INTRAVENOUS
  Filled 2015-04-10: qty 10

## 2015-04-10 MED ORDER — SODIUM CHLORIDE 0.9 % IV SOLN
Freq: Once | INTRAVENOUS | Status: AC
  Administered 2015-04-10: 21 mg via INTRAVENOUS
  Filled 2015-04-10: qty 21

## 2015-04-10 MED ORDER — INSULIN ASPART 100 UNIT/ML ~~LOC~~ SOLN
10.0000 [IU] | Freq: Once | SUBCUTANEOUS | Status: AC
  Administered 2015-04-10: 10 [IU] via SUBCUTANEOUS

## 2015-04-10 MED ORDER — DEXAMETHASONE 4 MG PO TABS
40.0000 mg | ORAL_TABLET | Freq: Once | ORAL | Status: AC
  Administered 2015-04-10: 40 mg via ORAL
  Filled 2015-04-10: qty 10

## 2015-04-10 MED ORDER — SODIUM CHLORIDE 0.9 % IV SOLN
10.0000 mg/m2 | Freq: Once | INTRAVENOUS | Status: AC
  Administered 2015-04-10: 22 mg via INTRAVENOUS
  Filled 2015-04-10: qty 11

## 2015-04-10 NOTE — Progress Notes (Signed)
BSA, drugs, dosages and dilutions verified with 2nd RN Lottie Dawson for Velcade, Cis Plat, Adriamycin, Cytoxan and Etoposide.

## 2015-04-10 NOTE — Progress Notes (Signed)
Forwarded paperwork to RN for processing

## 2015-04-10 NOTE — Progress Notes (Signed)
2 RNs verified chemo, drugs, dosages, and dilutions

## 2015-04-10 NOTE — Progress Notes (Signed)
Mark [name] started chemotherapy for his relapsed light chain myeloma yesterday. He is doing okay. He does not have any nausea or vomiting. He's had no tonsil diarrhea. He is a little constipated. He is out of bed.  He's had good pain control. The IV morphine helped some.  He's had no bleeding. There's been no fever. He's had no leg swelling.  There's been no mass source. He is using mouth rinses.  We've not checked his lab work today. I will go ahead and check this tomorrow.  [name], his temperature is 98.3. Blood pressure is 129/82. Pulse is 96. His oral exam shows no mucositis. Neck is without adenopathy. Lungs are clear. Cardiac exam regular rate and rhythm. Abdomen is soft. He has no fluid wave. There is no palpable liver or spleen tip. Extremities shows no clubbing, cyanosis or edema. Skin exam shows no rashes. Neurological exam is nonfocal.  We will continue his chemotherapy. He will receive this through the weekend. I anticipate discharge early next week.  We will check his labs tomorrow.  I suspect that he probably will need to be transfused at some point before he is discharged. We will have to see how the labs look.  I appreciate all the great care that he is getting from all the staff on [address] [facility]!!!  Mark E.  Romans 12:9-10

## 2015-04-11 DIAGNOSIS — C9002 Multiple myeloma in relapse: Secondary | ICD-10-CM

## 2015-04-11 DIAGNOSIS — G893 Neoplasm related pain (acute) (chronic): Secondary | ICD-10-CM

## 2015-04-11 DIAGNOSIS — D696 Thrombocytopenia, unspecified: Secondary | ICD-10-CM

## 2015-04-11 DIAGNOSIS — D649 Anemia, unspecified: Secondary | ICD-10-CM

## 2015-04-11 LAB — CBC WITH DIFFERENTIAL/PLATELET
BASOS ABS: 0 10*3/uL (ref 0.0–0.1)
BASOS PCT: 0 % (ref 0–1)
EOS ABS: 0 10*3/uL (ref 0.0–0.7)
EOS PCT: 0 % (ref 0–5)
HEMATOCRIT: 26.1 % — AB (ref 39.0–52.0)
Hemoglobin: 9.1 g/dL — ABNORMAL LOW (ref 13.0–17.0)
Lymphocytes Relative: 3 % — ABNORMAL LOW (ref 12–46)
Lymphs Abs: 0.2 10*3/uL — ABNORMAL LOW (ref 0.7–4.0)
MCH: 30.4 pg (ref 26.0–34.0)
MCHC: 34.9 g/dL (ref 30.0–36.0)
MCV: 87.3 fL (ref 78.0–100.0)
MONO ABS: 0.6 10*3/uL (ref 0.1–1.0)
Monocytes Relative: 8 % (ref 3–12)
NEUTROS ABS: 7.4 10*3/uL (ref 1.7–7.7)
Neutrophils Relative %: 90 % — ABNORMAL HIGH (ref 43–77)
PLATELETS: 74 10*3/uL — AB (ref 150–400)
RBC: 2.99 MIL/uL — ABNORMAL LOW (ref 4.22–5.81)
RDW: 16.4 % — AB (ref 11.5–15.5)
WBC: 8.2 10*3/uL (ref 4.0–10.5)

## 2015-04-11 LAB — COMPREHENSIVE METABOLIC PANEL
ALBUMIN: 4.2 g/dL (ref 3.5–5.0)
ALT: 23 U/L (ref 17–63)
ANION GAP: 6 (ref 5–15)
AST: 26 U/L (ref 15–41)
Alkaline Phosphatase: 84 U/L (ref 38–126)
BUN: 12 mg/dL (ref 6–20)
CHLORIDE: 109 mmol/L (ref 101–111)
CO2: 22 mmol/L (ref 22–32)
Calcium: 8.3 mg/dL — ABNORMAL LOW (ref 8.9–10.3)
Creatinine, Ser: 1.04 mg/dL (ref 0.61–1.24)
GFR calc Af Amer: >60 mL/min (ref >60)
GFR calc non Af Amer: >60 mL/min (ref >60)
GLUCOSE: 220 mg/dL — AB (ref 65–99)
POTASSIUM: 4.7 mmol/L (ref 3.5–5.1)
SODIUM: 137 mmol/L (ref 135–145)
Total Bilirubin: 0.6 mg/dL (ref 0.3–1.2)
Total Protein: 6.5 g/dL (ref 6.5–8.1)

## 2015-04-11 LAB — GLUCOSE, CAPILLARY
GLUCOSE-CAPILLARY: 168 mg/dL — AB (ref 65–99)
GLUCOSE-CAPILLARY: 210 mg/dL — AB (ref 65–99)
GLUCOSE-CAPILLARY: 200 mg/dL — AB (ref 65–99)
GLUCOSE-CAPILLARY: 167 mg/dL — AB (ref 65–99)
Glucose-Capillary: 191 mg/dL — ABNORMAL HIGH (ref 65–99)
Glucose-Capillary: 168 mg/dL — ABNORMAL HIGH (ref 65–99)

## 2015-04-11 MED ORDER — PALONOSETRON HCL INJECTION 0.25 MG/5ML
0.2500 mg | Freq: Once | INTRAVENOUS | Status: AC
  Administered 2015-04-11: 0.25 mg via INTRAVENOUS
  Filled 2015-04-11: qty 5

## 2015-04-11 MED ORDER — PROCHLORPERAZINE EDISYLATE 5 MG/ML IJ SOLN
10.0000 mg | Freq: Four times a day (QID) | INTRAMUSCULAR | Status: DC | PRN
  Administered 2015-04-11: 10 mg via INTRAVENOUS
  Filled 2015-04-11: qty 2

## 2015-04-11 MED ORDER — SODIUM CHLORIDE 0.9 % IV SOLN
10.0000 mg/m2 | Freq: Once | INTRAVENOUS | Status: AC
  Administered 2015-04-11: 22 mg via INTRAVENOUS
  Filled 2015-04-11: qty 11

## 2015-04-11 MED ORDER — DEXAMETHASONE 4 MG PO TABS
40.0000 mg | ORAL_TABLET | Freq: Once | ORAL | Status: AC
  Administered 2015-04-11: 40 mg via ORAL
  Filled 2015-04-11: qty 10

## 2015-04-11 MED ORDER — SODIUM CHLORIDE 0.9 % IV SOLN
Freq: Once | INTRAVENOUS | Status: AC
  Administered 2015-04-11: 21 mg via INTRAVENOUS
  Filled 2015-04-11: qty 21

## 2015-04-11 MED ORDER — DEXTROSE-NACL 5-0.45 % IV SOLN
Freq: Once | INTRAVENOUS | Status: AC
  Administered 2015-04-11: 14:00:00 via INTRAVENOUS
  Filled 2015-04-11: qty 10

## 2015-04-11 MED ORDER — ONDANSETRON HCL 4 MG/2ML IJ SOLN
4.0000 mg | Freq: Three times a day (TID) | INTRAMUSCULAR | Status: DC | PRN
  Administered 2015-04-11 – 2015-04-14 (×3): 4 mg via INTRAVENOUS
  Filled 2015-04-11 (×3): qty 2

## 2015-04-11 MED ORDER — INSULIN ASPART 100 UNIT/ML ~~LOC~~ SOLN: 0.0000 [IU] | Freq: Every day | SUBCUTANEOUS | Status: DC

## 2015-04-11 MED ORDER — INSULIN ASPART 100 UNIT/ML ~~LOC~~ SOLN
0.0000 [IU] | Freq: Three times a day (TID) | SUBCUTANEOUS | Status: DC
  Administered 2015-04-11: 5 [IU] via SUBCUTANEOUS
  Administered 2015-04-12: 2 [IU] via SUBCUTANEOUS
  Administered 2015-04-12: 3 [IU] via SUBCUTANEOUS
  Administered 2015-04-12: 5 [IU] via SUBCUTANEOUS
  Administered 2015-04-13: 2 [IU] via SUBCUTANEOUS
  Administered 2015-04-13: 8 [IU] via SUBCUTANEOUS
  Administered 2015-04-13: 2 [IU] via SUBCUTANEOUS

## 2015-04-11 NOTE — Progress Notes (Signed)
IP PROGRESS NOTE  Subjective:   Patient is sleeping comfortably this morning. He did have some issues with nausea overnight but no vomiting. His pain is under control. He tolerated chemotherapy otherwise without any incident.  Objective:  Vital signs in last 24 hours: Temp:  [97.9 F (36.6 C)-98.6 F (37 C)] 97.9 F (36.6 C) (09/10 0420) Pulse Rate:  [84-91] 84 (09/10 0420) Resp:  [18-20] 20 (09/10 0420) BP: (124-149)/(86-98) 124/86 mmHg (09/10 0420) SpO2:  [100 %] 100 % (09/10 0420) Weight:  [206 lb (93.441 kg)] 206 lb (93.441 kg) (09/10 0420) Weight change:  Last BM Date: 04/10/15  Intake/Output from previous day: 09/09 0701 - 09/10 0700 In: -  Out: 1300 [Urine:1300]  Mouth: mucous membranes moist, pharynx normal without lesions Resp: clear to auscultation bilaterally Cardio: regular rate and rhythm, S1, S2 normal, no murmur, click, rub or gallop GI: soft, non-tender; bowel sounds normal; no masses,  no organomegaly Extremities: extremities normal, atraumatic, no cyanosis or edema  PICC-without erythema  Lab Results:  Recent Labs  04/09/15 0654 04/11/15 0500  WBC 2.0* 8.2  HGB 9.1* 9.1*  HCT 26.3* 26.1*  PLT 68* 74*    BMET  Recent Labs  04/09/15 0654 04/11/15 0500  NA 142 137  K 3.9 4.7  CL 111 109  CO2 25 22  GLUCOSE 106* 220*  BUN 9 12  CREATININE 0.99 1.04  CALCIUM 7.7* 8.3*    Studies/Results: No results found.  Medications: I have reviewed the patient's current medications. Current Facility-Administered Medications  Medication Dose Route Frequency Provider Last Rate Last Dose  . 0.9 %  sodium chloride infusion   Intravenous Continuous [name], MD 75 mL/hr at 04/08/15 1323    . 0.9 %  sodium chloride infusion   Intravenous Continuous [name], MD 20 mL/hr at 04/09/15 1014    . alteplase (CATHFLO ACTIVASE) injection 2 mg  2 mg Intracatheter Once PRN [name], MD      . antiseptic oral rinse (BIOTENE) solution 15 mL  15  mL Mouth Rinse QID [name], MD   15 mL at 04/10/15 2121  . bisacodyl (DULCOLAX) EC tablet 5 mg  5 mg Oral Daily PRN [name], MD   5 mg at 04/10/15 1148  . CISplatin (PLATINOL) 21 mg, cyclophosphamide (CYTOXAN) 840 mg, etoposide (VEPESID) 84 mg in sodium chloride 0.9 % 1,000 mL chemo infusion   Intravenous Once [name], MD 44 mL/hr at 04/10/15 1141 21 mg at 04/10/15 1141  . Cold Pack 1 packet  1 packet Topical Once PRN [name], MD      . DOXOrubicin (ADRIAMYCIN) 22 mg in sodium chloride 0.9 % 1,000 mL chemo infusion  10 mg/m2 (Treatment Plan Actual) Intravenous Once [name], MD 42 mL/hr at 04/10/15 1209 22 mg at 04/10/15 1209  . enoxaparin (LOVENOX) injection 50 mg  50 mg Subcutaneous Q24H [name], MD   50 mg at 04/08/15 1421  . famciclovir [facility]) tablet 500 mg  500 mg Oral Daily [name], MD   500 mg at 04/10/15 1107  . fentaNYL (DURAGESIC - dosed mcg/hr) 75 mcg  75 mcg Transdermal Q72H [name], MD      . fluconazole (DIFLUCAN) tablet 100 mg  100 mg Oral Daily [name], MD   100 mg at 04/10/15 1108  . gabapentin (NEURONTIN) capsule 600 mg  600 mg Oral TID [name], MD   600  mg at 04/10/15 1734  . heparin lock flush 100 unit/mL  500 Units Intracatheter Once PRN [name], MD      . heparin lock flush 100 unit/mL  250 Units Intracatheter Once PRN [name], MD      . Hot Pack 1 packet  1 packet Topical Once PRN [name], MD      . HYDROmorphone (DILAUDID) tablet 4-8 mg  4-8 mg Oral Q4H PRN [name], MD      . ipratropium-albuterol (DUONEB) 0.5-2.5 (3) MG/3ML nebulizer solution 3 mL  3 mL Nebulization Q6H PRN [name], MD      . LORazepam (ATIVAN) tablet 0.5 mg  0.5 mg Oral Q6H PRN [name], MD      . metoCLOPramide (REGLAN) tablet 10 mg  10 mg Oral Q6H PRN [name], MD      . morphine 4 MG/ML injection 4 mg  4 mg Intravenous Q2H PRN [name], MD   4 mg at 04/10/15 1850   . ondansetron (ZOFRAN-ODT) disintegrating tablet 8 mg  8 mg Oral Q8H PRN [name], MD   8 mg at 04/10/15 1847  . pantoprazole (PROTONIX) EC tablet 40 mg  40 mg Oral Daily [name], MD   40 mg at 04/10/15 1108  . prochlorperazine (COMPAZINE) tablet 10 mg  10 mg Oral Q6H PRN [name], MD      . saccharomyces boulardii (FLORASTOR) capsule 250 mg  250 mg Oral BID [name], MD   250 mg at 04/10/15 2206  . sodium bicarbonate/sodium chloride mouthwash 1015ml   Mouth Rinse QID [name], MD      . sodium chloride 0.9 % injection 10 mL  10 mL Intracatheter PRN [name], MD      . sodium chloride 0.9 % injection 10-40 mL  10-40 mL Intracatheter PRN [name], MD      . sodium chloride 0.9 % injection 3 mL  3 mL Intravenous PRN [name], MD      . temazepam (RESTORIL) capsule 22.5 mg  22.5 mg Oral QHS PRN [name], MD         Assessment/Plan:  44 year old gentleman with the following issues:  1. 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 He is on day 2 of VD-PACE without any complications. He has adequate antiemetics for the time being.  2. Anemia and thrombocytopenia: His hemoglobin is adequate today and does not require transfusions. Platelets are slightly improved without bleeding.  3. Infectious disease: No signs and symptoms of infection at this time.  4. The pain: He is currently on a fentanyl patch with morphine and Dilaudid as needed. He was sleeping comfortably this morning. Easily aroused without any somnolence.  5. DVT prophylaxis: Currently on Lovenox.   LOS: 3 days   [id number] 04/11/2015, 7:17 AM

## 2015-04-11 NOTE — Progress Notes (Signed)
Dr Alen Blew notified of CBG of 210, new orders given for Sliding Scale Insulin. Will continue to monitor.

## 2015-04-11 NOTE — Progress Notes (Signed)
Chemotherapy dosage and calculations checked and verified with Baker Pierini RN.

## 2015-04-11 NOTE — Progress Notes (Signed)
Dr Alen Blew called concerning nausea, new orders given. Will continue to monitor.

## 2015-04-12 DIAGNOSIS — R739 Hyperglycemia, unspecified: Secondary | ICD-10-CM

## 2015-04-12 LAB — COMPREHENSIVE METABOLIC PANEL
ALBUMIN: 4.8 g/dL (ref 3.5–5.0)
ALK PHOS: 89 U/L (ref 38–126)
ALT: 28 U/L (ref 17–63)
ANION GAP: 10 (ref 5–15)
AST: 27 U/L (ref 15–41)
BUN: 14 mg/dL (ref 6–20)
CALCIUM: 8 mg/dL — AB (ref 8.9–10.3)
CHLORIDE: 108 mmol/L (ref 101–111)
CO2: 21 mmol/L — AB (ref 22–32)
Creatinine, Ser: 1.11 mg/dL (ref 0.61–1.24)
GFR calc Af Amer: >60 mL/min (ref >60)
GFR calc non Af Amer: >60 mL/min (ref >60)
GLUCOSE: 189 mg/dL — AB (ref 65–99)
POTASSIUM: 4.2 mmol/L (ref 3.5–5.1)
SODIUM: 139 mmol/L (ref 135–145)
Total Bilirubin: 1.1 mg/dL (ref 0.3–1.2)
Total Protein: 7.1 g/dL (ref 6.5–8.1)

## 2015-04-12 LAB — GLUCOSE, CAPILLARY
GLUCOSE-CAPILLARY: 145 mg/dL — AB (ref 65–99)
GLUCOSE-CAPILLARY: 193 mg/dL — AB (ref 65–99)
GLUCOSE-CAPILLARY: 204 mg/dL — AB (ref 65–99)
GLUCOSE-CAPILLARY: 180 mg/dL — AB (ref 65–99)

## 2015-04-12 LAB — CBC WITH DIFFERENTIAL/PLATELET
BASOS PCT: 0 % (ref 0–1)
Basophils Absolute: 0 10*3/uL (ref 0.0–0.1)
EOS ABS: 0 10*3/uL (ref 0.0–0.7)
Eosinophils Relative: 0 % (ref 0–5)
HCT: 29.8 % — ABNORMAL LOW (ref 39.0–52.0)
HEMOGLOBIN: 10.3 g/dL — AB (ref 13.0–17.0)
Lymphocytes Relative: 4 % — ABNORMAL LOW (ref 12–46)
Lymphs Abs: 0.4 10*3/uL — ABNORMAL LOW (ref 0.7–4.0)
MCH: 30.2 pg (ref 26.0–34.0)
MCHC: 34.6 g/dL (ref 30.0–36.0)
MCV: 87.4 fL (ref 78.0–100.0)
MONOS PCT: 5 % (ref 3–12)
Monocytes Absolute: 0.5 10*3/uL (ref 0.1–1.0)
NEUTROS PCT: 91 % — AB (ref 43–77)
Neutro Abs: 8.9 10*3/uL — ABNORMAL HIGH (ref 1.7–7.7)
PLATELETS: 83 10*3/uL — AB (ref 150–400)
RBC: 3.41 MIL/uL — AB (ref 4.22–5.81)
RDW: 16.5 % — ABNORMAL HIGH (ref 11.5–15.5)
WBC: 9.8 10*3/uL (ref 4.0–10.5)

## 2015-04-12 LAB — KAPPA/LAMBDA LIGHT CHAINS
KAPPA FREE LGHT CHN: 534.75 mg/L — AB (ref 3.30–19.40)
Kappa, lambda light chain ratio: 96.53 — ABNORMAL HIGH (ref 0.26–1.65)
LAMDA FREE LIGHT CHAINS: 5.54 mg/L — AB (ref 5.71–26.30)

## 2015-04-12 MED ORDER — BORTEZOMIB CHEMO IV INJECTION 3.5 MG
1.3000 mg/m2 | Freq: Once | INTRAMUSCULAR | Status: AC
  Administered 2015-04-12: 2.8 mg via INTRAVENOUS
  Filled 2015-04-12: qty 2.8

## 2015-04-12 MED ORDER — SODIUM CHLORIDE 0.9 % IV SOLN
10.0000 mg/m2 | Freq: Once | INTRAVENOUS | Status: AC
  Administered 2015-04-12: 22 mg via INTRAVENOUS
  Filled 2015-04-12: qty 11

## 2015-04-12 MED ORDER — SODIUM CHLORIDE 0.9 % IV SOLN
Freq: Once | INTRAVENOUS | Status: AC
  Administered 2015-04-12: 21 mg via INTRAVENOUS
  Filled 2015-04-12: qty 21

## 2015-04-12 MED ORDER — DEXAMETHASONE 4 MG PO TABS
40.0000 mg | ORAL_TABLET | Freq: Once | ORAL | Status: AC
  Administered 2015-04-12: 40 mg via ORAL
  Filled 2015-04-12: qty 10

## 2015-04-12 MED ORDER — ENOXAPARIN SODIUM 40 MG/0.4ML ~~LOC~~ SOLN: 40.0000 mg | SUBCUTANEOUS | Status: DC

## 2015-04-12 MED ORDER — DEXTROSE-NACL 5-0.45 % IV SOLN
Freq: Once | INTRAVENOUS | Status: AC
  Administered 2015-04-12: 17:00:00 via INTRAVENOUS
  Filled 2015-04-12: qty 10

## 2015-04-12 NOTE — Progress Notes (Signed)
Chemotherapy dosage and calculations checked and verified with Diana Torres RN. 

## 2015-04-12 NOTE — Progress Notes (Signed)
Patient's CBG at 2200 was 180. He does not require any coverage at HS until he is 200. However the patient is eating dinner at this time and is requesting insulin coverage and asks for me to page the doctor on call. I paged and spoke to Dr. Alen Blew. He was informed of the above and no new orders were received at this time.

## 2015-04-12 NOTE — Progress Notes (Signed)
IP PROGRESS NOTE  Subjective:   Patient is sleeping comfortably this morning. He was able to sleep better overnight. He did have issues with his nausea and vomiting during the day that was manageable with intravenous Zofran and Compazine. His blood sugars were slightly elevated again and needed an insulin sliding scale. Otherwise he feels well this morning without any complaints. His pain is under excellent control.  Objective:  Vital signs in last 24 hours: Temp:  [97.9 F (36.6 C)-98.1 F (36.7 C)] 98.1 F (36.7 C) (09/11 0559) Pulse Rate:  [88-96] 96 (09/11 0559) Resp:  [16-20] 16 (09/11 0559) BP: (121-138)/(71-94) 121/82 mmHg (09/11 0559) SpO2:  [99 %-100 %] 99 % (09/11 0559) Weight:  [204 lb (92.534 kg)] 204 lb (92.534 kg) (09/11 0559) Weight change: 6.4 oz (0.181 kg) Last BM Date: 04/11/15  Intake/Output from previous day: 09/10 0701 - 09/11 0700 In: 240 [P.O.:240] Out: 600 [Urine:600]  Mouth: mucous membranes moist, pharynx normal without lesions Resp: clear to auscultation bilaterally Cardio: regular rate and rhythm, S1, S2 normal, no murmur, click, rub or gallop GI: soft, non-tender; bowel sounds normal; no masses,  no organomegaly Extremities: extremities normal, atraumatic, no cyanosis or edema  PICC-without erythema  Lab Results:  Recent Labs  04/11/15 0500 04/12/15 0540  WBC 8.2 9.8  HGB 9.1* 10.3*  HCT 26.1* 29.8*  PLT 74* 83*    BMET  Recent Labs  04/11/15 0500 04/12/15 0540  NA 137 139  K 4.7 4.2  CL 109 108  CO2 22 21*  GLUCOSE 220* 189*  BUN 12 14  CREATININE 1.04 1.11  CALCIUM 8.3* 8.0*    Studies/Results: No results found.  Medications: I have reviewed the patient's current medications. Current Facility-Administered Medications  Medication Dose Route Frequency Provider Last Rate Last Dose  . 0.9 %  sodium chloride infusion   Intravenous Continuous [name], MD 75 mL/hr at 04/08/15 1323    . 0.9 %  sodium chloride infusion    Intravenous Continuous [name], MD 20 mL/hr at 04/09/15 1014    . alteplase (CATHFLO ACTIVASE) injection 2 mg  2 mg Intracatheter Once PRN [name], MD      . antiseptic oral rinse (BIOTENE) solution 15 mL  15 mL Mouth Rinse QID [name], MD   15 mL at 04/11/15 2224  . bisacodyl (DULCOLAX) EC tablet 5 mg  5 mg Oral Daily PRN [name], MD   5 mg at 04/11/15 2223  . CISplatin (PLATINOL) 21 mg, cyclophosphamide (CYTOXAN) 840 mg, etoposide (VEPESID) 84 mg in sodium chloride 0.9 % 1,000 mL chemo infusion   Intravenous Once [name], MD 44 mL/hr at 04/11/15 1356 21 mg at 04/11/15 1356  . Cold Pack 1 packet  1 packet Topical Once PRN [name], MD      . DOXOrubicin (ADRIAMYCIN) 22 mg in sodium chloride 0.9 % 1,000 mL chemo infusion  10 mg/m2 (Treatment Plan Actual) Intravenous Once [name], MD 42 mL/hr at 04/11/15 1357 22 mg at 04/11/15 1357  . enoxaparin (LOVENOX) injection 50 mg  50 mg Subcutaneous Q24H [name], MD   50 mg at 04/08/15 1421  . famciclovir [facility]) tablet 500 mg  500 mg Oral Daily [name], MD   500 mg at 04/11/15 1244  . fentaNYL (DURAGESIC - dosed mcg/hr) 75 mcg  75 mcg Transdermal Q72H [name], MD   75 mcg at 04/11/15 1402  . fluconazole (  DIFLUCAN) tablet 100 mg  100 mg Oral Daily [name], MD   100 mg at 04/11/15 1244  . gabapentin (NEURONTIN) capsule 600 mg  600 mg Oral TID [name], MD   600 mg at 04/11/15 2224  . heparin lock flush 100 unit/mL  500 Units Intracatheter Once PRN [name], MD      . heparin lock flush 100 unit/mL  250 Units Intracatheter Once PRN [name], MD      . Hot Pack 1 packet  1 packet Topical Once PRN [name], MD      . HYDROmorphone (DILAUDID) tablet 4-8 mg  4-8 mg Oral Q4H PRN [name], MD      . insulin aspart (novoLOG) injection 0-15 Units  0-15 Units Subcutaneous TID WC Wyatt Portela, MD   5 Units at 04/11/15 1752  . insulin aspart  (novoLOG) injection 0-5 Units  0-5 Units Subcutaneous QHS Wyatt Portela, MD   0 Units at 04/11/15 2224  . ipratropium-albuterol (DUONEB) 0.5-2.5 (3) MG/3ML nebulizer solution 3 mL  3 mL Nebulization Q6H PRN [name], MD      . LORazepam (ATIVAN) tablet 0.5 mg  0.5 mg Oral Q6H PRN [name], MD      . metoCLOPramide (REGLAN) tablet 10 mg  10 mg Oral Q6H PRN [name], MD      . morphine 4 MG/ML injection 4 mg  4 mg Intravenous Q2H PRN [name], MD   4 mg at 04/10/15 1850  . ondansetron (ZOFRAN) injection 4 mg  4 mg Intravenous Q8H PRN Wyatt Portela, MD   4 mg at 04/11/15 0900  . ondansetron (ZOFRAN-ODT) disintegrating tablet 8 mg  8 mg Oral Q8H PRN [name], MD   8 mg at 04/10/15 1847  . pantoprazole (PROTONIX) EC tablet 40 mg  40 mg Oral Daily [name], MD   40 mg at 04/11/15 1243  . prochlorperazine (COMPAZINE) injection 10 mg  10 mg Intravenous Q6H PRN Wyatt Portela, MD   10 mg at 04/11/15 1219  . prochlorperazine (COMPAZINE) tablet 10 mg  10 mg Oral Q6H PRN [name], MD      . saccharomyces boulardii (FLORASTOR) capsule 250 mg  250 mg Oral BID [name], MD   250 mg at 04/11/15 2224  . sodium bicarbonate/sodium chloride mouthwash 1011ml   Mouth Rinse QID [name], MD      . sodium chloride 0.9 % injection 10 mL  10 mL Intracatheter PRN [name], MD      . sodium chloride 0.9 % injection 10-40 mL  10-40 mL Intracatheter PRN [name], MD      . sodium chloride 0.9 % injection 3 mL  3 mL Intravenous PRN [name], MD      . temazepam (RESTORIL) capsule 22.5 mg  22.5 mg Oral QHS PRN [name], MD         Assessment/Plan:  44 year old gentleman with the following issues:  1. 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 He is on day 2 of VD-PACE without any complications. He has intravenous antiemetics which have helped.  2. Anemia and thrombocytopenia: His hemoglobin is adequate  today and does not require transfusions. Platelets are slightly improved without bleeding.  3. Infectious disease: No signs and symptoms of infection at this time.  4. Pain: He is currently on a fentanyl  patch with morphine and Dilaudid as needed. Pain adequately controlled.  5. DVT prophylaxis: Currently on Lovenox.  6. Hyperglycemia: Related to dexamethasone without diabetes mellitus. He is currently on insulin sliding scale.   LOS: 4 days   [facility] 04/12/2015, 7:46 AM

## 2015-04-13 LAB — COMPREHENSIVE METABOLIC PANEL
ALBUMIN: 3.8 g/dL (ref 3.5–5.0)
ALK PHOS: 73 U/L (ref 38–126)
ALT: 22 U/L (ref 17–63)
ALT: 21 U/L (ref 17–63)
ANION GAP: 6 (ref 5–15)
AST: 20 U/L (ref 15–41)
AST: 22 U/L (ref 15–41)
Albumin: 4 g/dL (ref 3.5–5.0)
Alkaline Phosphatase: 70 U/L (ref 38–126)
Anion gap: 6 (ref 5–15)
BILIRUBIN TOTAL: 1 mg/dL (ref 0.3–1.2)
BILIRUBIN TOTAL: 0.9 mg/dL (ref 0.3–1.2)
BUN: 18 mg/dL (ref 6–20)
BUN: 21 mg/dL — AB (ref 6–20)
CALCIUM: 7.3 mg/dL — AB (ref 8.9–10.3)
CHLORIDE: 108 mmol/L (ref 101–111)
CO2: 24 mmol/L (ref 22–32)
CO2: 23 mmol/L (ref 22–32)
CREATININE: 1.11 mg/dL (ref 0.61–1.24)
Calcium: 7.1 mg/dL — ABNORMAL LOW (ref 8.9–10.3)
Chloride: 108 mmol/L (ref 101–111)
Creatinine, Ser: 1.14 mg/dL (ref 0.61–1.24)
GFR calc Af Amer: >60 mL/min (ref >60)
GFR calc non Af Amer: >60 mL/min (ref >60)
GLUCOSE: 154 mg/dL — AB (ref 65–99)
GLUCOSE: 175 mg/dL — AB (ref 65–99)
POTASSIUM: 4.8 mmol/L (ref 3.5–5.1)
POTASSIUM: 4.5 mmol/L (ref 3.5–5.1)
Sodium: 138 mmol/L (ref 135–145)
Sodium: 137 mmol/L (ref 135–145)
TOTAL PROTEIN: 6.3 g/dL — AB (ref 6.5–8.1)
Total Protein: 5.9 g/dL — ABNORMAL LOW (ref 6.5–8.1)

## 2015-04-13 LAB — GLUCOSE, CAPILLARY
GLUCOSE-CAPILLARY: 147 mg/dL — AB (ref 65–99)
GLUCOSE-CAPILLARY: 264 mg/dL — AB (ref 65–99)
Glucose-Capillary: 144 mg/dL — ABNORMAL HIGH (ref 65–99)
Glucose-Capillary: 89 mg/dL (ref 65–99)

## 2015-04-13 LAB — CBC WITH DIFFERENTIAL/PLATELET
BASOS ABS: 0 10*3/uL (ref 0.0–0.1)
BASOS PCT: 0 % (ref 0–1)
BASOS PCT: 0 % (ref 0–1)
Basophils Absolute: 0 10*3/uL (ref 0.0–0.1)
Eosinophils Absolute: 0 10*3/uL (ref 0.0–0.7)
Eosinophils Absolute: 0 10*3/uL (ref 0.0–0.7)
Eosinophils Relative: 0 % (ref 0–5)
Eosinophils Relative: 0 % (ref 0–5)
HEMATOCRIT: 25.9 % — AB (ref 39.0–52.0)
HEMATOCRIT: 24.5 % — AB (ref 39.0–52.0)
HEMOGLOBIN: 9.1 g/dL — AB (ref 13.0–17.0)
Hemoglobin: 8.5 g/dL — ABNORMAL LOW (ref 13.0–17.0)
LYMPHS ABS: 0.2 10*3/uL — AB (ref 0.7–4.0)
LYMPHS PCT: 3 % — AB (ref 12–46)
Lymphocytes Relative: 3 % — ABNORMAL LOW (ref 12–46)
Lymphs Abs: 0.2 10*3/uL — ABNORMAL LOW (ref 0.7–4.0)
MCH: 30.5 pg (ref 26.0–34.0)
MCH: 30.4 pg (ref 26.0–34.0)
MCHC: 35.1 g/dL (ref 30.0–36.0)
MCHC: 34.7 g/dL (ref 30.0–36.0)
MCV: 86.9 fL (ref 78.0–100.0)
MCV: 87.5 fL (ref 78.0–100.0)
MONO ABS: 0.3 10*3/uL (ref 0.1–1.0)
MONO ABS: 0.3 10*3/uL (ref 0.1–1.0)
MONOS PCT: 5 % (ref 3–12)
Monocytes Relative: 6 % (ref 3–12)
NEUTROS ABS: 6.2 10*3/uL (ref 1.7–7.7)
NEUTROS ABS: 4.8 10*3/uL (ref 1.7–7.7)
NEUTROS PCT: 93 % — AB (ref 43–77)
NEUTROS PCT: 91 % — AB (ref 43–77)
Platelets: 60 10*3/uL — ABNORMAL LOW (ref 150–400)
Platelets: 51 10*3/uL — ABNORMAL LOW (ref 150–400)
RBC: 2.98 MIL/uL — ABNORMAL LOW (ref 4.22–5.81)
RBC: 2.8 MIL/uL — AB (ref 4.22–5.81)
RDW: 16.2 % — AB (ref 11.5–15.5)
RDW: 16.2 % — AB (ref 11.5–15.5)
WBC: 6.7 10*3/uL (ref 4.0–10.5)
WBC: 5.2 10*3/uL (ref 4.0–10.5)

## 2015-04-13 NOTE — Progress Notes (Signed)
Patient stated that he felt her heart racing and felt like something was wrong- tech took vitals.   B/p 123/77 Pulse 85 Resp. 18 o2 100 on RA   RN spoke to patient about vitals- patient states that knowing his vitals are great, makes him fell better. Will continue to monitor

## 2015-04-13 NOTE — Progress Notes (Signed)
Called to patient room by patient.  IV tubing attaching 24 hour chemo bag of Cis Plat, Cytoxan and Etoposide had become detached from patients PICC line.  Approximately 1 1/2 to 2 dozen drops of blood, bloody fluid noted to floor.  With IV rate at 44 cc/hour, it is doubtful that > 30 cc of chemo had spilled.  Spill Kit obtained and area cleaned per policy.  EVS did follow-up mopping to area.  IV site patent with good blood return and flushed readily.

## 2015-04-13 NOTE — Progress Notes (Signed)
Mr. Pacella had a good weekend. He will finish her chemotherapy today.  His blood sugars have been a little on the high side. I really don't worry about these too much as this is from his steroids and is partly from his IV fluid.  He has some nausea on Friday. This seems to be doing better.  He's had no bleeding. He's had no fever. He's had no change in bowel or bladder habits.  There's been no leg swelling.  He's had no rashes.  His labs all look pretty good. White cell count 6.7. Hemoglobin 9.1. Platelet count 60,000. Calcium 7.3 with an albumin of 4.0.  [name], his temperature 98.5. Pulse is 89. Blood pressure 143/92. His oral exam is negative. There is no adenopathy in his neck. Lungs are clear. Cardiac exam regular rate rhythm. Abdomen is soft. He has good bowel sounds. Extremities shows no clubbing, cyanosis or edema. Skin exam shows no rashes. Neurological exam is nonfocal.  He will patient up his second cycle of chemotherapy today. I think he is doing a 24-hour urine.  Hopefully, if all is good tomorrow, we can discharge him and follow him as now patient.  Pete E.  1 Corinthians 15:58

## 2015-04-14 ENCOUNTER — Telehealth: Payer: Self-pay | Admitting: Hematology & Oncology

## 2015-04-14 ENCOUNTER — Other Ambulatory Visit: Payer: Self-pay | Admitting: Hematology & Oncology

## 2015-04-14 DIAGNOSIS — C9000 Multiple myeloma not having achieved remission: Secondary | ICD-10-CM

## 2015-04-14 LAB — COMPREHENSIVE METABOLIC PANEL
ALT: 20 U/L (ref 17–63)
AST: 21 U/L (ref 15–41)
Albumin: 3.8 g/dL (ref 3.5–5.0)
Alkaline Phosphatase: 66 U/L (ref 38–126)
Anion gap: 6 (ref 5–15)
BUN: 24 mg/dL — AB (ref 6–20)
CHLORIDE: 106 mmol/L (ref 101–111)
CO2: 26 mmol/L (ref 22–32)
CREATININE: 1.02 mg/dL (ref 0.61–1.24)
Calcium: 7.2 mg/dL — ABNORMAL LOW (ref 8.9–10.3)
GFR calc Af Amer: >60 mL/min (ref >60)
GFR calc non Af Amer: >60 mL/min (ref >60)
Glucose, Bld: 117 mg/dL — ABNORMAL HIGH (ref 65–99)
Potassium: 3.7 mmol/L (ref 3.5–5.1)
SODIUM: 138 mmol/L (ref 135–145)
Total Bilirubin: 1 mg/dL (ref 0.3–1.2)
Total Protein: 5.9 g/dL — ABNORMAL LOW (ref 6.5–8.1)

## 2015-04-14 LAB — CBC WITH DIFFERENTIAL/PLATELET
BASOS ABS: 0 10*3/uL (ref 0.0–0.1)
BASOS PCT: 0 % (ref 0–1)
EOS ABS: 0 10*3/uL (ref 0.0–0.7)
EOS PCT: 0 % (ref 0–5)
HCT: 26.1 % — ABNORMAL LOW (ref 39.0–52.0)
Hemoglobin: 9.2 g/dL — ABNORMAL LOW (ref 13.0–17.0)
Lymphocytes Relative: 8 % — ABNORMAL LOW (ref 12–46)
Lymphs Abs: 0.2 10*3/uL — ABNORMAL LOW (ref 0.7–4.0)
MCH: 30.1 pg (ref 26.0–34.0)
MCHC: 35.2 g/dL (ref 30.0–36.0)
MCV: 85.3 fL (ref 78.0–100.0)
Monocytes Absolute: 0.1 10*3/uL (ref 0.1–1.0)
Monocytes Relative: 2 % — ABNORMAL LOW (ref 3–12)
Neutro Abs: 2.5 10*3/uL (ref 1.7–7.7)
Neutrophils Relative %: 89 % — ABNORMAL HIGH (ref 43–77)
PLATELETS: 51 10*3/uL — AB (ref 150–400)
RBC: 3.06 MIL/uL — AB (ref 4.22–5.81)
RDW: 15.9 % — ABNORMAL HIGH (ref 11.5–15.5)
WBC: 2.8 10*3/uL — AB (ref 4.0–10.5)

## 2015-04-14 LAB — GLUCOSE, CAPILLARY
GLUCOSE-CAPILLARY: 202 mg/dL — AB (ref 65–99)
Glucose-Capillary: 115 mg/dL — ABNORMAL HIGH (ref 65–99)

## 2015-04-14 MED ORDER — HEPARIN SOD (PORK) LOCK FLUSH 100 UNIT/ML IV SOLN
500.0000 [IU] | INTRAVENOUS | Status: AC | PRN
  Administered 2015-04-14: 500 [IU]
  Filled 2015-04-14: qty 5

## 2015-04-14 NOTE — Progress Notes (Signed)
Nursing Discharge Summary  Patient ID: Aaryn Sermon MRN: [id number] DOB/AGE: 1971-04-15 44 y.o.  Admit date: 04/08/2015 Discharge date: 04/14/2015  Discharged Condition: good  Disposition: 01-Home or Self Care  Follow-up Information    Follow up with [name], MD In 1 day.   Specialty:  Oncology   Why:  [facility] and labs   Contact information:   [place], [address] [place] [address] [phone]       Prescriptions Given: No prescriptions given.  Patient medications and follow up appointments discussed. Patient verbalized understanding without further questions  Means of Discharge: patient to ambulate downstairs to be discharged home via private vehicle.  Signed: Buel Ream 04/14/2015, 11:08 AM

## 2015-04-14 NOTE — Telephone Encounter (Signed)
Called patient regarding his upcoming appointments that have been scheduled today.       [facility].

## 2015-04-14 NOTE — Discharge Summary (Addendum)
Patient ID: Mark [name] MRN: [id number] DOB/AGE: 43-20-1972 43 y.o.  Admit date: 04/08/2015 Discharge date: 04/14/2015  Patient Care Team: No Pcp Per Patient as PCP - General (General Practice)  Brief History of Present Illness: For complete details please refer to admission H and P, but in brief, Mark [name] is an 44 y.o. 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 He continued shingles prophylaxis All myeloma treatment was placed on hold while pancytopenic. As his counts improved, he was admitted for  Cycle 2 chemotherapy on 04/08/15.   Last Zometa given on 8/2  Pancytopenia In the setting of malignancy This is stable, improving, will continue to monitor while in hospital.  Last Neupogen given on 8/25  DVT prophylaxis On Lovenox  Hyperglycemia Due to dexamethasone and IVF, will monitor as outpatient  Pain Controlled with Fentanyl patch, morphine and Dilaudid as needed  Full Code.   Past Medical History  Diagnosis Date  . History of radiation therapy 02/07/13- 02/26/13    lower L spine, upper sacrum, 35 gray in 14 fractions  . FH: chemotherapy     Dr. Burney Gauze  . Cancer   . Multiple myeloma dx'd 01/2013  . GERD (gastroesophageal reflux disease)     Discharge Medications:    Medication List    TAKE these medications        bisacodyl 5 MG EC tablet  Commonly known as:  DULCOLAX  Take 5 mg by mouth daily as needed for moderate constipation (constipation).     famciclovir 500 MG tablet  Commonly known as:   FAMVIR  Take 1 tablet (500 mg total) by mouth daily.     fentaNYL 75 MCG/HR  Commonly known as:  DURAGESIC - dosed mcg/hr  Place 1 patch (75 mcg total) onto the skin every 3 (three) days.     fluconazole 100 MG tablet  Commonly known as:  DIFLUCAN  Take 1 tablet (100 mg total) by mouth daily.     gabapentin 300 MG capsule  Commonly known as:  NEURONTIN  Take 2 capsules (600 mg total) by mouth 3 (three) times daily.     HYDROmorphone 4 MG tablet  Commonly known as:  DILAUDID  Take 1-2, IF NEEDED, for pain every 4 hrs for pain     Ipratropium-Albuterol 20-100 MCG/ACT Aers respimat  Commonly known as:  COMBIVENT  Inhale 1 puff into the lungs every 6 (six) hours.     lidocaine-prilocaine cream  Commonly known as:  EMLA  Apply to affected area once     LORazepam 0.5 MG tablet  Commonly known as:  ATIVAN  Take 1 tablet (0.5 mg total) by mouth every 6 (six) hours as needed (Nausea or vomiting).     metoCLOPramide 10 MG tablet  Commonly known as:  REGLAN  Take 1 tablet (10 mg total) by mouth every 6 (six) hours as needed for nausea or vomiting.     ondansetron 8 MG disintegrating tablet  Commonly known as:  ZOFRAN ODT  Take 1 tablet (8 mg total) by mouth every 8 (eight) hours as needed for nausea  or vomiting.     pantoprazole 40 MG tablet  Commonly known as:  PROTONIX  Take 1 tablet (40 mg total) by mouth daily.     PRESCRIPTION MEDICATION  Chemo at [facility].     prochlorperazine 10 MG tablet  Commonly known as:  COMPAZINE  Take 1 tablet (10 mg total) by mouth every 6 (six) hours as needed (Nausea or vomiting).     saccharomyces boulardii 250 MG capsule  Commonly known as:  FLORASTOR  Take 1 capsule (250 mg total) by mouth 2 (two) times daily.     temazepam 22.5 MG capsule  Commonly known as:  RESTORIL  Take 1 capsule (22.5 mg total) by mouth at bedtime as needed for sleep.        Discharge Condition: Improved.  Diet recommendation:  Regular.   Disposition and  Follow-up:  Discharge Instructions    De-access Port A Cath    Complete by:  As directed      Discharge patient    Complete by:  As directed      PICC line removal    Complete by:  As directed           Follow-up Information    Follow up with [name], MD In 1 day.   Specialty:  Oncology   Why:  [facility] for Neulasta and labs   Contact information:   [place], [address] [place] [address] [phone]        ECOG PERFORMANCE STATUS:1  Physical Exam at Discharge:  Subjective: Denies fevers, chills, night sweats, vision changes, or mucositis. He reports mild fatigue. Denies any respiratory complaints. Denies any chest pain or palpitations. Denies lower extremity swelling. Denies nausea, heartburn or change in bowel habits. Denies abdominal pain. Appetite is normal. Denies any dysuria. Denies abnormal skin rashes. Neuropathy is unchanged. Denies any bleeding issues such as epistaxis, hematemesis, hematuria or hematochezia. Ambulating without difficulty  Objective:  BP 139/86 mmHg  Pulse 98  Temp(Src) 98.5 F (36.9 C) (Oral)  Resp 18  Ht 5' 11" (1.803 m)  Wt 204 lb (92.534 kg)  BMI 28.46 kg/m2  SpO2 100%    GENERAL:alert, no distress and comfortable SKIN: skin color, texture, turgor are normal, no rashes or significant lesions EYES: normal, conjunctiva are pink and non-injected, sclera clear OROPHARYNX:no exudate, no erythema and lips, buccal mucosa, and tongue normal  NECK: supple, thyroid normal size, non-tender, without nodularity LYMPH: no palpable lymphadenopathy in the cervical, axillary or inguinal LUNGS: clear to auscultation and percussion with normal breathing effort HEART: regular rate & rhythm and no murmurs and no lower extremity edema. Right port normal ABDOMEN: soft, non-tender and normal bowel sounds Musculoskeletal:no cyanosis of digits and no clubbing  PSYCH: alert & oriented x 3 with fluent speech NEURO: no  focal motor/sensory deficits    Significant Diagnostic Studies:  Dg Chest 2 View  03/14/2015   CLINICAL DATA:  Shortness breath.  Chest pain.  Multiple myeloma.  EXAM: CHEST - 2 VIEW  COMPARISON:  Two-view chest x-ray 03/06/2015  FINDINGS: Heart size is normal. A right IJ Port-A-Cath is stable. The lung volumes are somewhat low. No focal airspace disease is present. There is no edema or effusion to suggest failure. The visualized soft tissues are within normal limits. Focal kyphosis in the lower thoracic spine is stable.  IMPRESSION: 1. No acute cardiopulmonary disease or significant interval change. 2. Persistent low lung volumes. 3. Stable right IJ Port-A-Cath.  Electronically Signed   By: San Morelle M.D.   On: 03/14/2015 02:46     Discharge Laboratory Values:  CBC  Recent Labs Lab 04/07/15 1112  04/09/15 0654 04/11/15 0500 04/12/15 0540 04/13/15 0540 04/13/15 1020  WBC 2.3*  < > 2.0* 8.2 9.8 6.7 5.2  HGB 10.4*  < > 9.1* 9.1* 10.3* 9.1* 8.5*  HCT 30.4*  < > 26.3* 26.1* 29.8* 25.9* 24.5*  PLT 76*  < > 68* 74* 83* 60* 51*  MCV 89  < > 87.4 87.3 87.4 86.9 87.5  MCH 30.4  < > 30.2 30.4 30.2 30.5 30.4  MCHC 34.2  < > 34.6 34.9 34.6 35.1 34.7  RDW 14.9  < > 15.5 16.4* 16.5* 16.2* 16.2*  LYMPHSABS 0.6*  --   --  0.2* 0.4* 0.2* 0.2*  MONOABS  --   --   --  0.6 0.5 0.3 0.3  EOSABS 0.0  --   --  0.0 0.0 0.0 0.0  BASOSABS 0.0  --   --  0.0 0.0 0.0 0.0  < > = values in this interval not displayed.   Chemistries   Recent Labs Lab 04/09/15 0654 04/11/15 0500 04/12/15 0540 04/13/15 0540 04/13/15 1020  NA 142 137 139 138 137  K 3.9 4.7 4.2 4.8 4.5  CL 111 109 108 108 108  CO2 25 22 21* 24 23  GLUCOSE 106* 220* 189* 154* 175*  BUN _0 21*  CREATININE 0.99 1.04 1.11 1.14 1.11  CALCIUM 7.7* 8.3* 8.0* 7.3* 7.1*     Coagulation profile No results for input(s): INR, PROTIME in the last 168 hours.  Signed: Sharene Butters, PA-C 04/14/2015, 7:57 AM    ADDENDUM:   I grew the above note. He's done well so far. We will have to see what his light chains are like.  He goes down to [facility] for consultation and next week.  We will get him to the office tomorrow for his Neulasta and labs. We will then see him back in 2 days for follow-up.  Pete E.  Isaiah 12:2

## 2015-04-14 NOTE — Telephone Encounter (Signed)
Ruby called w [place] CLAIMS DEPT req records to:  Claim: [id number]  F: [phone] P: [phone]   04/2013 - 04/2014        COPY SCANNED

## 2015-04-15 ENCOUNTER — Ambulatory Visit (HOSPITAL_BASED_OUTPATIENT_CLINIC_OR_DEPARTMENT_OTHER): Payer: BLUE CROSS/BLUE SHIELD

## 2015-04-15 ENCOUNTER — Ambulatory Visit: Payer: BLUE CROSS/BLUE SHIELD

## 2015-04-15 ENCOUNTER — Other Ambulatory Visit (HOSPITAL_BASED_OUTPATIENT_CLINIC_OR_DEPARTMENT_OTHER): Payer: BLUE CROSS/BLUE SHIELD

## 2015-04-15 ENCOUNTER — Other Ambulatory Visit: Payer: BLUE CROSS/BLUE SHIELD

## 2015-04-15 VITALS — BP 101/61 | HR 113 | Temp 99.1°F | Resp 18

## 2015-04-15 DIAGNOSIS — Z5189 Encounter for other specified aftercare: Secondary | ICD-10-CM | POA: Diagnosis not present

## 2015-04-15 DIAGNOSIS — C9000 Multiple myeloma not having achieved remission: Secondary | ICD-10-CM

## 2015-04-15 LAB — UIFE/LIGHT CHAINS/TP QN, 24-HR UR
% BETA, URINE: 83.8 %
ALPHA 1 URINE: 0.7 %
Albumin, U: 10.8 %
Alpha 2, Urine: 3.3 %
FREE KAPPA/LAMBDA RATIO: 6272.73 — AB (ref 2.04–10.37)
Free Lambda Lt Chains,Ur: 0.66 mg/L (ref 0.24–6.66)
Free Lt Chn Excr Rate: 4140 mg/L — ABNORMAL HIGH (ref 1.35–24.19)
GAMMA GLOBULIN URINE: 1.4 %
M-SPIKE %, URINE: 73.3 % — AB
Time: 24 hours
Total Protein, Urine: 33.8 mg/dL
Volume, Urine: 3250 mL

## 2015-04-15 LAB — CMP (CANCER CENTER ONLY)
ALBUMIN: 3.6 g/dL (ref 3.3–5.5)
ALT(SGPT): 22 U/L (ref 10–47)
AST: 21 U/L (ref 11–38)
Alkaline Phosphatase: 69 U/L (ref 26–84)
BUN: 14 mg/dL (ref 7–22)
CHLORIDE: 100 meq/L (ref 98–108)
CO2: 27 meq/L (ref 18–33)
CREATININE: 1 mg/dL (ref 0.6–1.2)
Calcium: 7.4 mg/dL — ABNORMAL LOW (ref 8.0–10.3)
Glucose, Bld: 132 mg/dL — ABNORMAL HIGH (ref 73–118)
POTASSIUM: 3.5 meq/L (ref 3.3–4.7)
SODIUM: 133 meq/L (ref 128–145)
Total Bilirubin: 1.2 mg/dl (ref 0.20–1.60)
Total Protein: 5.8 g/dL — ABNORMAL LOW (ref 6.4–8.1)

## 2015-04-15 LAB — CBC WITH DIFFERENTIAL (CANCER CENTER ONLY)
BASO#: 0 10*3/uL (ref 0.0–0.2)
BASO%: 0.5 % (ref 0.0–2.0)
EOS ABS: 0 10*3/uL (ref 0.0–0.5)
EOS%: 0.5 % (ref 0.0–7.0)
HCT: 27.7 % — ABNORMAL LOW (ref 38.7–49.9)
HGB: 9.8 g/dL — ABNORMAL LOW (ref 13.0–17.1)
LYMPH#: 0.2 10*3/uL — ABNORMAL LOW (ref 0.9–3.3)
LYMPH%: 7.2 % — AB (ref 14.0–48.0)
MCH: 30.5 pg (ref 28.0–33.4)
MCHC: 35.4 g/dL (ref 32.0–35.9)
MCV: 86 fL (ref 82–98)
MONO#: 0 10*3/uL — ABNORMAL LOW (ref 0.1–0.9)
MONO%: 1.9 % (ref 0.0–13.0)
NEUT#: 1.9 10*3/uL (ref 1.5–6.5)
NEUT%: 89.9 % — ABNORMAL HIGH (ref 40.0–80.0)
PLATELETS: 45 10*3/uL — AB (ref 145–400)
RBC: 3.21 10*6/uL — ABNORMAL LOW (ref 4.20–5.70)
RDW: 14.9 % (ref 11.1–15.7)
WBC: 2.1 10*3/uL — ABNORMAL LOW (ref 4.0–10.0)

## 2015-04-15 MED ORDER — SODIUM CHLORIDE 0.9 % IV SOLN
Freq: Once | INTRAVENOUS | Status: AC
  Administered 2015-04-15: 14:00:00 via INTRAVENOUS

## 2015-04-15 MED ORDER — PEGFILGRASTIM INJECTION 6 MG/0.6ML ~~LOC~~
6.0000 mg | PREFILLED_SYRINGE | Freq: Once | SUBCUTANEOUS | Status: AC
  Administered 2015-04-15: 6 mg via SUBCUTANEOUS

## 2015-04-15 NOTE — Patient Instructions (Signed)
Pegfilgrastim injection What is this medicine? PEGFILGRASTIM (peg fil GRA stim) is a long-acting granulocyte colony-stimulating factor that stimulates the growth of neutrophils, a type of white blood cell important in the body's fight against infection. It is used to reduce the incidence of fever and infection in patients with certain types of cancer who are receiving chemotherapy that affects the bone marrow. This medicine may be used for other purposes; ask your health care provider or pharmacist if you have questions. COMMON BRAND NAME(S): Neulasta What should I tell my health care provider before I take this medicine? They need to know if you have any of these conditions: -latex allergy -ongoing radiation therapy -sickle cell disease -skin reactions to acrylic adhesives (On-Body Injector only) -an unusual or allergic reaction to pegfilgrastim, filgrastim, other medicines, foods, dyes, or preservatives -pregnant or trying to get pregnant -breast-feeding How should I use this medicine? This medicine is for injection under the skin. If you get this medicine at home, you will be taught how to prepare and give the pre-filled syringe or how to use the On-body Injector. Refer to the patient Instructions for Use for detailed instructions. Use exactly as directed. Take your medicine at regular intervals. Do not take your medicine more often than directed. It is important that you put your used needles and syringes in a special sharps container. Do not put them in a trash can. If you do not have a sharps container, call your pharmacist or healthcare provider to get one. Talk to your pediatrician regarding the use of this medicine in children. Special care may be needed. Overdosage: If you think you have taken too much of this medicine contact a poison control center or emergency room at once. NOTE: This medicine is only for you. Do not share this medicine with others. What if I miss a dose? It is  important not to miss your dose. Call your doctor or health care professional if you miss your dose. If you miss a dose due to an On-body Injector failure or leakage, a new dose should be administered as soon as possible using a single prefilled syringe for manual use. What may interact with this medicine? Interactions have not been studied. Give your health care provider a list of all the medicines, herbs, non-prescription drugs, or dietary supplements you use. Also tell them if you smoke, drink alcohol, or use illegal drugs. Some items may interact with your medicine. This list may not describe all possible interactions. Give your health care provider a list of all the medicines, herbs, non-prescription drugs, or dietary supplements you use. Also tell them if you smoke, drink alcohol, or use illegal drugs. Some items may interact with your medicine. What should I watch for while using this medicine? You may need blood work done while you are taking this medicine. If you are going to need a MRI, CT scan, or other procedure, tell your doctor that you are using this medicine (On-Body Injector only). What side effects may I notice from receiving this medicine? Side effects that you should report to your doctor or health care professional as soon as possible: -allergic reactions like skin rash, itching or hives, swelling of the face, lips, or tongue -dizziness -fever -pain, redness, or irritation at site where injected -pinpoint red spots on the skin -shortness of breath or breathing problems -stomach or side pain, or pain at the shoulder -swelling -tiredness -trouble passing urine Side effects that usually do not require medical attention (report to your doctor   or health care professional if they continue or are bothersome): -bone pain -muscle pain This list may not describe all possible side effects. Call your doctor for medical advice about side effects. You may report side effects to FDA at  1-800-FDA-1088. Where should I keep my medicine? Keep out of the reach of children. Store pre-filled syringes in a refrigerator between 2 and 8 degrees C (36 and 46 degrees F). Do not freeze. Keep in carton to protect from light. Throw away this medicine if it is left out of the refrigerator for more than 48 hours. Throw away any unused medicine after the expiration date. NOTE: This sheet is a summary. It may not cover all possible information. If you have questions about this medicine, talk to your doctor, pharmacist, or health care provider.  2015, Elsevier/Gold Standard. (2013-10-17 16:14:05)  

## 2015-04-17 ENCOUNTER — Other Ambulatory Visit (HOSPITAL_BASED_OUTPATIENT_CLINIC_OR_DEPARTMENT_OTHER): Payer: BLUE CROSS/BLUE SHIELD

## 2015-04-17 ENCOUNTER — Other Ambulatory Visit: Payer: Self-pay

## 2015-04-17 ENCOUNTER — Encounter: Payer: Self-pay | Admitting: Family

## 2015-04-17 ENCOUNTER — Ambulatory Visit (HOSPITAL_BASED_OUTPATIENT_CLINIC_OR_DEPARTMENT_OTHER): Payer: BLUE CROSS/BLUE SHIELD | Admitting: Family

## 2015-04-17 VITALS — BP 116/70 | HR 119 | Temp 98.9°F | Resp 16 | Ht 71.0 in | Wt 209.0 lb

## 2015-04-17 DIAGNOSIS — C9000 Multiple myeloma not having achieved remission: Secondary | ICD-10-CM

## 2015-04-17 LAB — CBC WITH DIFFERENTIAL (CANCER CENTER ONLY)
BASO#: 0 10*3/uL (ref 0.0–0.2)
BASO%: 0.4 % (ref 0.0–2.0)
EOS%: 0.7 % (ref 0.0–7.0)
Eosinophils Absolute: 0 10*3/uL (ref 0.0–0.5)
HCT: 25.5 % — ABNORMAL LOW (ref 38.7–49.9)
HGB: 8.8 g/dL — ABNORMAL LOW (ref 13.0–17.1)
LYMPH#: 0.2 10*3/uL — ABNORMAL LOW (ref 0.9–3.3)
LYMPH%: 5.8 % — AB (ref 14.0–48.0)
MCH: 30.7 pg (ref 28.0–33.4)
MCHC: 34.5 g/dL (ref 32.0–35.9)
MCV: 89 fL (ref 82–98)
MONO#: 0.1 10*3/uL (ref 0.1–0.9)
MONO%: 1.8 % (ref 0.0–13.0)
NEUT#: 2.5 10*3/uL (ref 1.5–6.5)
NEUT%: 91.3 % — AB (ref 40.0–80.0)
PLATELETS: 17 10*3/uL — AB (ref 145–400)
RBC: 2.87 10*6/uL — AB (ref 4.20–5.70)
RDW: 15.2 % (ref 11.1–15.7)
WBC: 2.8 10*3/uL — AB (ref 4.0–10.0)

## 2015-04-17 NOTE — Progress Notes (Signed)
Hematology and Oncology Follow Up Visit  Mark [name] [id number] 14-Dec-1970 44 y.o. 04/17/2015   Principle Diagnosis:  Relapsed kappa light chain myeloma - July 2016  - transplant at [facility] in February 2015   Current Therapy:   Salvage VD-PACE q 21 days  Zometa 4 mg IV every month    Interim History: Mark [name] is here today for follow-up. Unfortunately, he no longer appears to be responding to treatment. His myeloma studies are now trending upward. Light chains in his urine have now increased to 41,000.  He is extremely fatigued at this time. He has also had some nausea without vomiting.  His platelet count is low at 17. He is anemic as well with hgb of 8.8. He has had no episodes of bleeding or bruising.  He has had some gas and bloating. He will try taking gasex to relieve this. He denies constipation or diarrhea.  No fever, chills, cough, rash, chest pain, palpitations, problems urinating. No blood in urine or stool.  He still has some SOB with exertion at times and with the fatigue.  No lymphadenopathy found on exam.  He has rescheduled his appointment with Mark [name] for next week on Wednesday.  No swelling in his extremities. 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 His back pain is controlled at this time. No new aches or pains.  He is able to eat despite the nausea and he is taking in fluids. He denies feeling dehydrated at this time. His weight is up 5 lbs since his last visit.   Medications:    Medication List       This list is accurate as of: 04/17/15  2:38 PM.  Always use your most recent med list.               bisacodyl 5 MG EC tablet  Commonly known as:  DULCOLAX  Take 5 mg by mouth daily as needed for moderate constipation (constipation).     famciclovir 500 MG tablet  Commonly known as:  FAMVIR  Take 1 tablet (500 mg total) by mouth daily.     fentaNYL 75 MCG/HR  Commonly known as:  DURAGESIC - dosed mcg/hr  Place 1 patch (75 mcg total)  onto the skin every 3 (three) days.     fluconazole 100 MG tablet  Commonly known as:  DIFLUCAN  Take 1 tablet (100 mg total) by mouth daily.     gabapentin 300 MG capsule  Commonly known as:  NEURONTIN  Take 2 capsules (600 mg total) by mouth 3 (three) times daily.     HYDROmorphone 4 MG tablet  Commonly known as:  DILAUDID  Take 1-2, IF NEEDED, for pain every 4 hrs for pain     Ipratropium-Albuterol 20-100 MCG/ACT Aers respimat  Commonly known as:  COMBIVENT  Inhale 1 puff into the lungs every 6 (six) hours.     lidocaine-prilocaine cream  Commonly known as:  EMLA  Apply to affected area once     LORazepam 0.5 MG tablet  Commonly known as:  ATIVAN  Take 1 tablet (0.5 mg total) by mouth every 6 (six) hours as needed (Nausea or vomiting).     metoCLOPramide 10 MG tablet  Commonly known as:  REGLAN  Take 1 tablet (10 mg total) by mouth every 6 (six) hours as needed for nausea or vomiting.     ondansetron 8 MG disintegrating tablet  Commonly known as:  ZOFRAN ODT  Take 1 tablet (8 mg total) by  mouth every 8 (eight) hours as needed for nausea or vomiting.     pantoprazole 40 MG tablet  Commonly known as:  PROTONIX  Take 1 tablet (40 mg total) by mouth daily.     PRESCRIPTION MEDICATION  Chemo at [facility].     prochlorperazine 10 MG tablet  Commonly known as:  COMPAZINE  Take 1 tablet (10 mg total) by mouth every 6 (six) hours as needed (Nausea or vomiting).     saccharomyces boulardii 250 MG capsule  Commonly known as:  FLORASTOR  Take 1 capsule (250 mg total) by mouth 2 (two) times daily.     temazepam 22.5 MG capsule  Commonly known as:  RESTORIL  Take 1 capsule (22.5 mg total) by mouth at bedtime as needed for sleep.        Allergies:  Allergies  Allergen Reactions  . Gadolinium Derivatives Nausea And Vomiting    Pt became very nauseous and got sick.     Past Medical History, Surgical history, Social history, and Family History were reviewed and  updated.  Review of Systems: All other 10 point review of systems is negative.   Physical Exam:  height is 5\' 11"  (1.803 m) and weight is 209 lb (94.802 kg). His oral temperature is 98.9 F (37.2 C). His blood pressure is 116/70 and his pulse is 119. His respiration is 16.   Wt Readings from Last 3 Encounters:  04/17/15 209 lb (94.802 kg)  04/12/15 204 lb (92.534 kg)  04/07/15 203 lb (92.08 kg)    Ocular: Sclerae unicteric, pupils equal, round and reactive to light Ear-nose-throat: Oropharynx clear, dentition fair Lymphatic: No cervical or supraclavicular adenopathy Lungs no rales or rhonchi, good excursion bilaterally Heart regular rate and rhythm, no murmur appreciated Abd soft, nontender, positive bowel sounds MSK no focal spinal tenderness, no joint edema Neuro: non-focal, well-oriented, appropriate affect  Lab Results  Component Value Date   WBC 2.8* 04/17/2015   HGB 8.8* 04/17/2015   HCT 25.5* 04/17/2015   MCV 89 04/17/2015   PLT 17* 04/17/2015   No results found for: FERRITIN, IRON, TIBC, UIBC, IRONPCTSAT Lab Results  Component Value Date   RBC 2.87* 04/17/2015   Lab Results  Component Value Date   KPAFRELGTCHN 534.75* 04/11/2015   LAMBDASER 5.54* 04/11/2015   KAPLAMBRATIO 6272.73* 04/11/2015   Lab Results  Component Value Date   IGGSERUM 480* 04/07/2015   IGA 12* 04/07/2015   IGMSERUM 5* 04/07/2015   Lab Results  Component Value Date   TOTALPROTELP 6.6 04/07/2015   ALBUMINELP 4.4 04/07/2015   A1GS 0.3 04/07/2015   A2GS 0.7 04/07/2015   BETS 0.4 04/07/2015   BETA2SER 0.3 04/07/2015   GAMS 0.5* 04/07/2015   MSPIKE 0.23 09/22/2014   SPEI * 04/07/2015     Chemistry      Component Value Date/Time   NA 133 04/15/2015 1322   NA 138 04/14/2015 0830   NA 145 02/21/2013 0908   K 3.5 04/15/2015 1322   K 3.7 04/14/2015 0830   K 3.8 02/21/2013 0908   CL 100 04/15/2015 1322   CL 106 04/14/2015 0830   CO2 27 04/15/2015 1322   CO2 26 04/14/2015 0830    CO2 32* 02/21/2013 0908   BUN 14 04/15/2015 1322   BUN 24* 04/14/2015 0830   BUN 19.5 02/21/2013 0908   CREATININE 1.0 04/15/2015 1322   CREATININE 1.02 04/14/2015 0830   CREATININE 1.5* 02/21/2013 0908      Component Value Date/Time  CALCIUM 7.4* 04/15/2015 1322   CALCIUM 7.2* 04/14/2015 0830   CALCIUM 17.7 Repeated and Verified* 02/21/2013 0908   CALCIUM >15.0* 02/01/2013 1230   ALKPHOS 69 04/15/2015 1322   ALKPHOS 66 04/14/2015 0830   AST 21 04/15/2015 1322   AST 21 04/14/2015 0830   ALT 22 04/15/2015 1322   ALT 20 04/14/2015 0830   BILITOT 1.20 04/15/2015 1322   BILITOT 1.0 04/14/2015 0830     Impression and Plan: Mark [name] is 44 year old African American male with relapsed kappa light chain myeloma. He did undergo stem cell transplant for his myeloma on September 11, 2013 at [facility].  He ow appears to have stopped responding to his current treatment. This was a shared visit with Dr. Marin Olp and we discussed him switching to Daratumumab. He is agreeable to this so we will look at getting him started the week after next.  He is anemic and thrombocytopenic. We will have him come in Monday (9/19) for labs and infusion. He will likely need platelets and blood.  He denies needing fluids today.  He will follow-up with his doctors at [facility] on Wednesday.   We are still hoping to get his disease under control to point where he is eligable for an allogenic stem cell transplant.  He knows to call with any questions or concerns. We can always see him sooner if need be.   [name], NP 9/16/20162:38 PM    Addendum:   I saw and examined the patient with Mark [name]. Unfortunately, he is already begun to break through on the intensive chemotherapy with VD-PACE. His light chains are starting to go back up fairly quickly.  I think we're going to have to get him on a different regimen. There was some recent studies that have shown that Daratumumab with Velcade/Decadron have  had a good success in refractory myeloma patients.  I have spoken to the transplant doctors at [facility]. They're going to see him today. They will see if there is any matching the family.  I also am thinking about the CAR-T-cell protocols that are being utilized. This is another form of immunotherapy. I don't think this is done in this area. We can certainly look at the possibility of sending him elsewhere. I think a lot will depend on what he has for a allogeneic transplant and how well the Daratumumab works.  I talked to him about the Daratumumab Velcade protocol. He is willing to undergo this. I talked him about the side effects. He understands all this.  We will get started next week.  We spent about 45 minutes with him talking to him about his situation and how difficult his myeloma is becoming with respect to treatment response. Thankfully, he is not become hypercalcemic with stable he occurs with him when his myeloma becomes more aggressive.   Laurey Arrow

## 2015-04-20 ENCOUNTER — Other Ambulatory Visit: Payer: Self-pay | Admitting: Family

## 2015-04-20 ENCOUNTER — Ambulatory Visit (HOSPITAL_BASED_OUTPATIENT_CLINIC_OR_DEPARTMENT_OTHER): Payer: BLUE CROSS/BLUE SHIELD

## 2015-04-20 ENCOUNTER — Other Ambulatory Visit: Payer: Self-pay

## 2015-04-20 ENCOUNTER — Other Ambulatory Visit (HOSPITAL_BASED_OUTPATIENT_CLINIC_OR_DEPARTMENT_OTHER): Payer: BLUE CROSS/BLUE SHIELD

## 2015-04-20 ENCOUNTER — Ambulatory Visit (HOSPITAL_COMMUNITY)
Admission: RE | Admit: 2015-04-20 | Discharge: 2015-04-20 | Disposition: A | Discharge status: Home / Self Care | Payer: BLUE CROSS/BLUE SHIELD | Source: Ambulatory Visit | Attending: Hematology & Oncology | Admitting: Hematology & Oncology

## 2015-04-20 VITALS — BP 105/60 | HR 105 | Temp 98.8°F | Resp 18

## 2015-04-20 DIAGNOSIS — D649 Anemia, unspecified: Secondary | ICD-10-CM | POA: Diagnosis not present

## 2015-04-20 DIAGNOSIS — C9000 Multiple myeloma not having achieved remission: Secondary | ICD-10-CM

## 2015-04-20 DIAGNOSIS — D696 Thrombocytopenia, unspecified: Secondary | ICD-10-CM | POA: Insufficient documentation

## 2015-04-20 DIAGNOSIS — M549 Dorsalgia, unspecified: Secondary | ICD-10-CM

## 2015-04-20 LAB — CMP (CANCER CENTER ONLY)
ALBUMIN: 4 g/dL (ref 3.3–5.5)
ALT(SGPT): 17 U/L (ref 10–47)
AST: 17 U/L (ref 11–38)
Alkaline Phosphatase: 65 U/L (ref 26–84)
BILIRUBIN TOTAL: 0.9 mg/dL (ref 0.20–1.60)
BUN, Bld: 19 mg/dL (ref 7–22)
CALCIUM: 7.3 mg/dL — AB (ref 8.0–10.3)
CHLORIDE: 103 meq/L (ref 98–108)
CO2: 25 meq/L (ref 18–33)
Creat: 1.2 mg/dl (ref 0.6–1.2)
GLUCOSE: 110 mg/dL (ref 73–118)
POTASSIUM: 4.6 meq/L (ref 3.3–4.7)
Sodium: 138 mEq/L (ref 128–145)
Total Protein: 6.5 g/dL (ref 6.4–8.1)

## 2015-04-20 LAB — CBC WITH DIFFERENTIAL (CANCER CENTER ONLY)
HEMATOCRIT: 22 % — AB (ref 38.7–49.9)
HEMOGLOBIN: 7.7 g/dL — AB (ref 13.0–17.1)
MCH: 30.8 pg (ref 28.0–33.4)
MCHC: 35 g/dL (ref 32.0–35.9)
MCV: 88 fL (ref 82–98)
Platelets: <6 10*3/uL — CL (ref 145–400)
RBC: 2.5 10*6/uL — ABNORMAL LOW (ref 4.20–5.70)
RDW: 14.7 % (ref 11.1–15.7)
WBC: <0.2 10*3/uL — CL (ref 4.0–10.0)

## 2015-04-20 LAB — TECHNOLOGIST REVIEW CHCC SATELLITE

## 2015-04-20 LAB — HOLD TUBE, BLOOD BANK - CHCC SATELLITE

## 2015-04-20 MED ORDER — SODIUM CHLORIDE 0.9 % IV SOLN
INTRAVENOUS | Status: DC
  Administered 2015-04-20: 15:00:00 via INTRAVENOUS

## 2015-04-20 MED ORDER — HEPARIN SOD (PORK) LOCK FLUSH 100 UNIT/ML IV SOLN
500.0000 [IU] | Freq: Once | INTRAVENOUS | Status: AC
  Administered 2015-04-20: 500 [IU] via INTRAVENOUS
  Filled 2015-04-20: qty 5

## 2015-04-20 MED ORDER — HYDROMORPHONE HCL 1 MG/ML IJ SOLN
INTRAMUSCULAR | Status: AC
  Filled 2015-04-20: qty 2

## 2015-04-20 MED ORDER — SODIUM CHLORIDE 0.9 % IJ SOLN
10.0000 mL | INTRAMUSCULAR | Status: DC | PRN
  Administered 2015-04-20: 10 mL via INTRAVENOUS
  Filled 2015-04-20: qty 10

## 2015-04-20 MED ORDER — HYDROMORPHONE HCL 1 MG/ML IJ SOLN
2.0000 mg | Freq: Once | INTRAMUSCULAR | Status: AC
  Administered 2015-04-20: 2 mg via INTRAVENOUS

## 2015-04-20 NOTE — Patient Instructions (Signed)
Platelet Transfusion Information °This is information about transfusions of platelets. Platelets are tiny cells made by the bone marrow and found in the blood. When a blood vessel is damaged, platelets rush to the damaged area to help form a clot. This begins the healing process. When platelets get very low, your blood may have trouble clotting. This may be from: °· Illness. °· Blood disorder. °· Chemotherapy to treat cancer. °Often, lower platelet counts do not cause problems.  °Platelets usually last for 7 to 10 days. If they are not used in an injury, they are broken down by the liver or spleen. °Symptoms of low platelet count include: °· Nosebleeds. °· Bleeding gums. °· Heavy periods. °· Bruising and tiny blood spots in the skin. °¨ Pinpoint spots of bleeding (petechiae). °¨ Larger bruises (purpura). °· Bleeding can be more serious if it happens in the brain or bowel. °Platelet transfusions are often used to keep the platelet count at an acceptable level. Serious bleeding due to low platelets is uncommon. °RISKS AND COMPLICATIONS °Severe side effects from platelet transfusions are uncommon. Minor reactions may include: °· Itching. °· Rashes. °· High temperature and shivering. °Medications are available to stop transfusion reactions. Let your health care provider know if you develop any of the above problems.  °If you are having platelet transfusions frequently, they may get less effective. This is called becoming refractory to platelets. It is uncommon. This can happen from non-immune causes and immune causes. Non-immune causes include: °· High temperatures. °· Some medications. °· An enlarged spleen. °Immune causes happen when your body discovers the platelets are not your own and begins making antibodies against them. The antibodies kill the platelets quickly. Even with platelet transfusions, you may still notice problems with bleeding or bruising. Let your health care providers know about this. Other things  can be done to help if this happens.  °BEFORE THE PROCEDURE  °· Your health care provider will check your platelet count regularly. °· If the platelet count is too low, it may be necessary to have a platelet transfusion. °· This is more important before certain procedures with a risk of bleeding, such as a spinal tap. °· Platelet transfusion reduces the risk of bleeding during or after the procedure. °· Except in emergencies, giving a transfusion requires a written consent. °Before blood is taken from a donor, a complete history is taken to make sure the person has no history of previous diseases, nor engages in risky social behavior. Examples of this are intravenous drug use or sexual activity with multiple partners. This could lead to infected blood or blood products being used. This history is taken in spite of the extensive testing to make sure the blood is safe. All blood products transfused are tested to make sure it is a match for the person getting the blood. It is also checked for infections. Blood is the safest it has ever been. The risk of getting an infection is very low. °PROCEDURE °· The platelets are stored in small plastic bags that are kept at a low temperature. °· Each bag is called a unit and sometimes two units are given. They are given through an intravenous line by drip infusion over about one-half hour. °· Usually blood is collected from multiple people to get enough to transfuse. °· Sometimes, the platelets are collected from a single person. This is done using a special machine that separates the platelets from the blood. The machine is called an apheresis machine. Platelets collected in this   way are called apheresed platelets. Apheresed platelets reduce the risk of becoming sensitive to the platelets. This lowers the chances of having a transfusion reaction. °· As it only takes a short time to give the platelets, this treatment can be given in an outpatient department. Platelets can also be  given before or after other treatments. °SEEK IMMEDIATE MEDICAL CARE IF: °You have any of the following symptoms over the next 12 hours or several days: °· Shaking chills. °· Fever with a temperature greater than 102°F (38.9°C) develops. °· Back pain or muscle pain. °· People around you feel you are not acting correctly, or you are confused. °· Blood in the urine or bowel movements, or bleeding from any place in your body. °· Shortness of breath, or difficulty breathing. °· Dizziness. °· Fainting. °· You break out in a rash or develop hives. °· Decrease in the amount of urine you are putting out, or the urine turns a dark color or changes to pink, red, or brown. °· A severe headache or stiff neck. °· Bruising more easily. °Document Released: 05/15/2007 Document Revised: 12/02/2013 Document Reviewed: 05/15/2007 °ExitCare® Patient Information ©2015 [employer]. This information is not intended to replace advice given to you by your health care provider. Make sure you discuss any questions you have with your health care provider. ° °

## 2015-04-21 ENCOUNTER — Ambulatory Visit (HOSPITAL_BASED_OUTPATIENT_CLINIC_OR_DEPARTMENT_OTHER): Payer: BLUE CROSS/BLUE SHIELD

## 2015-04-21 ENCOUNTER — Other Ambulatory Visit: Payer: Self-pay | Admitting: *Deleted

## 2015-04-21 VITALS — BP 129/75 | HR 115 | Temp 99.6°F | Resp 18

## 2015-04-21 DIAGNOSIS — D696 Thrombocytopenia, unspecified: Secondary | ICD-10-CM | POA: Diagnosis not present

## 2015-04-21 DIAGNOSIS — C9000 Multiple myeloma not having achieved remission: Secondary | ICD-10-CM

## 2015-04-21 DIAGNOSIS — J181 Lobar pneumonia, unspecified organism: Principal | ICD-10-CM

## 2015-04-21 DIAGNOSIS — J189 Pneumonia, unspecified organism: Secondary | ICD-10-CM

## 2015-04-21 DIAGNOSIS — D649 Anemia, unspecified: Secondary | ICD-10-CM

## 2015-04-21 LAB — PREPARE PLATELET PHERESIS: UNIT DIVISION: 0

## 2015-04-21 LAB — PREPARE RBC (CROSSMATCH)

## 2015-04-21 MED ORDER — ACETAMINOPHEN 325 MG PO TABS
650.0000 mg | ORAL_TABLET | Freq: Once | ORAL | Status: AC
  Administered 2015-04-21: 650 mg via ORAL

## 2015-04-21 MED ORDER — FUROSEMIDE 10 MG/ML IJ SOLN
20.0000 mg | Freq: Once | INTRAMUSCULAR | Status: AC
  Administered 2015-04-21: 20 mg via INTRAVENOUS

## 2015-04-21 MED ORDER — SODIUM CHLORIDE 0.9 % IJ SOLN
10.0000 mL | INTRAMUSCULAR | Status: AC | PRN
  Administered 2015-04-21: 10 mL
  Filled 2015-04-21: qty 10

## 2015-04-21 MED ORDER — ACETAMINOPHEN 325 MG PO TABS
ORAL_TABLET | ORAL | Status: AC
  Filled 2015-04-21: qty 2

## 2015-04-21 MED ORDER — DIPHENHYDRAMINE HCL 25 MG PO CAPS
ORAL_CAPSULE | ORAL | Status: AC
  Filled 2015-04-21: qty 1

## 2015-04-21 MED ORDER — DIPHENHYDRAMINE HCL 25 MG PO CAPS
25.0000 mg | ORAL_CAPSULE | Freq: Once | ORAL | Status: AC
  Administered 2015-04-21: 25 mg via ORAL

## 2015-04-21 MED ORDER — HEPARIN SOD (PORK) LOCK FLUSH 100 UNIT/ML IV SOLN
500.0000 [IU] | Freq: Every day | INTRAVENOUS | Status: AC | PRN
  Administered 2015-04-21: 500 [IU]
  Filled 2015-04-21: qty 5

## 2015-04-21 MED ORDER — FUROSEMIDE 10 MG/ML IJ SOLN
INTRAMUSCULAR | Status: AC
  Filled 2015-04-21: qty 4

## 2015-04-21 MED ORDER — SODIUM CHLORIDE 0.9 % IV SOLN
250.0000 mL | Freq: Once | INTRAVENOUS | Status: AC
  Administered 2015-04-21: 250 mL via INTRAVENOUS

## 2015-04-21 NOTE — Patient Instructions (Signed)

## 2015-04-22 ENCOUNTER — Other Ambulatory Visit: Payer: Self-pay | Admitting: Hematology & Oncology

## 2015-04-22 ENCOUNTER — Ambulatory Visit: Payer: BLUE CROSS/BLUE SHIELD

## 2015-04-22 ENCOUNTER — Other Ambulatory Visit: Payer: BLUE CROSS/BLUE SHIELD

## 2015-04-22 DIAGNOSIS — C9000 Multiple myeloma not having achieved remission: Secondary | ICD-10-CM

## 2015-04-22 LAB — TYPE AND SCREEN
ABO/RH(D): B POS
ANTIBODY SCREEN: POSITIVE
DAT, IGG: NEGATIVE
Unit division: 0
Unit division: 0

## 2015-04-24 ENCOUNTER — Ambulatory Visit: Payer: BLUE CROSS/BLUE SHIELD

## 2015-04-24 ENCOUNTER — Other Ambulatory Visit: Payer: Self-pay | Admitting: Nurse Practitioner

## 2015-04-24 ENCOUNTER — Telehealth: Payer: Self-pay | Admitting: Hematology & Oncology

## 2015-04-24 ENCOUNTER — Other Ambulatory Visit: Payer: BLUE CROSS/BLUE SHIELD

## 2015-04-24 ENCOUNTER — Encounter: Payer: Self-pay | Admitting: Nurse Practitioner

## 2015-04-24 DIAGNOSIS — C9000 Multiple myeloma not having achieved remission: Secondary | ICD-10-CM

## 2015-04-24 NOTE — Telephone Encounter (Addendum)
[place]           [place] [id number]   04/28/2015 - 12/29/2015 18tx                P: AIM @ [phone] #3 F: [phone] fax [employer] Member #: [id number]

## 2015-04-27 ENCOUNTER — Ambulatory Visit: Payer: BLUE CROSS/BLUE SHIELD

## 2015-04-28 ENCOUNTER — Other Ambulatory Visit: Payer: Self-pay | Admitting: Family

## 2015-04-28 ENCOUNTER — Ambulatory Visit: Payer: BLUE CROSS/BLUE SHIELD

## 2015-04-28 ENCOUNTER — Ambulatory Visit (HOSPITAL_BASED_OUTPATIENT_CLINIC_OR_DEPARTMENT_OTHER): Payer: BLUE CROSS/BLUE SHIELD

## 2015-04-28 ENCOUNTER — Other Ambulatory Visit (HOSPITAL_BASED_OUTPATIENT_CLINIC_OR_DEPARTMENT_OTHER): Payer: BLUE CROSS/BLUE SHIELD

## 2015-04-28 ENCOUNTER — Other Ambulatory Visit: Payer: BLUE CROSS/BLUE SHIELD

## 2015-04-28 VITALS — BP 128/77 | HR 87 | Temp 98.2°F | Resp 18

## 2015-04-28 DIAGNOSIS — Z5112 Encounter for antineoplastic immunotherapy: Secondary | ICD-10-CM

## 2015-04-28 DIAGNOSIS — C9000 Multiple myeloma not having achieved remission: Secondary | ICD-10-CM | POA: Diagnosis not present

## 2015-04-28 DIAGNOSIS — M899 Disorder of bone, unspecified: Secondary | ICD-10-CM

## 2015-04-28 LAB — CMP (CANCER CENTER ONLY)
ALT(SGPT): 24 U/L (ref 10–47)
AST: 20 U/L (ref 11–38)
Albumin: 3.9 g/dL (ref 3.3–5.5)
Alkaline Phosphatase: 74 U/L (ref 26–84)
BUN: 9 mg/dL (ref 7–22)
CHLORIDE: 106 meq/L (ref 98–108)
CO2: 24 meq/L (ref 18–33)
Calcium: 8.2 mg/dL (ref 8.0–10.3)
Creat: 1 mg/dl (ref 0.6–1.2)
Glucose, Bld: 196 mg/dL — ABNORMAL HIGH (ref 73–118)
POTASSIUM: 3.7 meq/L (ref 3.3–4.7)
Sodium: 139 mEq/L (ref 128–145)
TOTAL PROTEIN: 6.7 g/dL (ref 6.4–8.1)
Total Bilirubin: 0.8 mg/dl (ref 0.20–1.60)

## 2015-04-28 LAB — CBC WITH DIFFERENTIAL (CANCER CENTER ONLY)
BASO#: 0 10*3/uL (ref 0.0–0.2)
BASO%: 0.2 % (ref 0.0–2.0)
EOS ABS: 0 10*3/uL (ref 0.0–0.5)
EOS%: 0.2 % (ref 0.0–7.0)
HCT: 27.6 % — ABNORMAL LOW (ref 38.7–49.9)
HGB: 9.2 g/dL — ABNORMAL LOW (ref 13.0–17.1)
LYMPH#: 0.4 10*3/uL — ABNORMAL LOW (ref 0.9–3.3)
LYMPH%: 6.3 % — AB (ref 14.0–48.0)
MCH: 30 pg (ref 28.0–33.4)
MCHC: 33.3 g/dL (ref 32.0–35.9)
MCV: 90 fL (ref 82–98)
MONO#: 1.2 10*3/uL — AB (ref 0.1–0.9)
MONO%: 19.2 % — ABNORMAL HIGH (ref 0.0–13.0)
NEUT#: 4.6 10*3/uL (ref 1.5–6.5)
NEUT%: 74.1 % (ref 40.0–80.0)
PLATELETS: 18 10*3/uL — AB (ref 145–400)
RBC: 3.07 10*6/uL — AB (ref 4.20–5.70)
RDW: 16.6 % — AB (ref 11.1–15.7)
WBC: 6.2 10*3/uL (ref 4.0–10.0)

## 2015-04-28 MED ORDER — SODIUM CHLORIDE 0.9 % IV SOLN
Freq: Once | INTRAVENOUS | Status: AC
  Administered 2015-04-28: 11:00:00 via INTRAVENOUS

## 2015-04-28 MED ORDER — METHYLPREDNISOLONE SODIUM SUCC 125 MG IJ SOLR
125.0000 mg | Freq: Once | INTRAMUSCULAR | Status: AC
  Administered 2015-04-28: 125 mg via INTRAVENOUS

## 2015-04-28 MED ORDER — ACETAMINOPHEN 325 MG PO TABS
ORAL_TABLET | ORAL | Status: AC
  Filled 2015-04-28: qty 2

## 2015-04-28 MED ORDER — SODIUM CHLORIDE 0.9 % IJ SOLN
10.0000 mL | INTRAMUSCULAR | Status: DC | PRN
  Administered 2015-04-28: 10 mL
  Filled 2015-04-28: qty 10

## 2015-04-28 MED ORDER — SODIUM CHLORIDE 0.9 % IV SOLN
Freq: Once | INTRAVENOUS | Status: AC
  Administered 2015-04-28: 11:00:00 via INTRAVENOUS
  Filled 2015-04-28: qty 4

## 2015-04-28 MED ORDER — ONDANSETRON HCL 8 MG PO TABS: 8.0000 mg | ORAL_TABLET | Freq: Once | ORAL | Status: DC

## 2015-04-28 MED ORDER — HYDROMORPHONE HCL 1 MG/ML IJ SOLN
INTRAMUSCULAR | Status: AC
  Filled 2015-04-28: qty 2

## 2015-04-28 MED ORDER — HYDROMORPHONE HCL 1 MG/ML IJ SOLN
2.0000 mg | Freq: Once | INTRAMUSCULAR | Status: AC
  Administered 2015-04-28: 2 mg via INTRAVENOUS

## 2015-04-28 MED ORDER — HEPARIN SOD (PORK) LOCK FLUSH 100 UNIT/ML IV SOLN
500.0000 [IU] | Freq: Once | INTRAVENOUS | Status: AC | PRN
  Administered 2015-04-28: 500 [IU]
  Filled 2015-04-28: qty 5

## 2015-04-28 MED ORDER — DIPHENHYDRAMINE HCL 25 MG PO CAPS
ORAL_CAPSULE | ORAL | Status: AC
  Filled 2015-04-28: qty 2

## 2015-04-28 MED ORDER — DEXAMETHASONE SODIUM PHOSPHATE 100 MG/10ML IJ SOLN: 20.0000 mg | Freq: Once | INTRAMUSCULAR | Status: DC

## 2015-04-28 MED ORDER — ACETAMINOPHEN 325 MG PO TABS
650.0000 mg | ORAL_TABLET | Freq: Once | ORAL | Status: AC
  Administered 2015-04-28: 650 mg via ORAL

## 2015-04-28 MED ORDER — DIPHENHYDRAMINE HCL 25 MG PO CAPS
50.0000 mg | ORAL_CAPSULE | Freq: Once | ORAL | Status: AC
  Administered 2015-04-28: 50 mg via ORAL

## 2015-04-28 MED ORDER — BORTEZOMIB CHEMO SQ INJECTION 3.5 MG (2.5MG/ML): 1.3000 mg/m2 | Freq: Once | INTRAMUSCULAR | Status: DC

## 2015-04-28 MED ORDER — METHYLPREDNISOLONE SODIUM SUCC 125 MG IJ SOLR
INTRAMUSCULAR | Status: AC
  Filled 2015-04-28: qty 2

## 2015-04-28 MED ORDER — SODIUM CHLORIDE 0.9 % IV SOLN
15.8000 mg/kg | Freq: Once | INTRAVENOUS | Status: AC
  Administered 2015-04-28: 1500 mg via INTRAVENOUS
  Filled 2015-04-28: qty 75

## 2015-04-28 NOTE — Patient Instructions (Signed)
[facility] Discharge Instructions for Patients Receiving Chemotherapy  Today you received the following chemotherapy agents Darzalex  To help prevent nausea and vomiting after your treatment, we encourage you to take your nausea medication    If you develop nausea and vomiting that is not controlled by your nausea medication, call the clinic.   BELOW ARE SYMPTOMS THAT SHOULD BE REPORTED IMMEDIATELY:  *FEVER GREATER THAN 100.5 F  *CHILLS WITH OR WITHOUT FEVER  NAUSEA AND VOMITING THAT IS NOT CONTROLLED WITH YOUR NAUSEA MEDICATION  *UNUSUAL SHORTNESS OF BREATH  *UNUSUAL BRUISING OR BLEEDING  TENDERNESS IN MOUTH AND THROAT WITH OR WITHOUT PRESENCE OF ULCERS  *URINARY PROBLEMS  *BOWEL PROBLEMS  UNUSUAL RASH Items with * indicate a potential emergency and should be followed up as soon as possible.  Feel free to call the clinic you have any questions or concerns. The clinic phone number is [phone].  Please show the CHEMO ALERT CARD at check-in to the Emergency Department and triage nurse.   

## 2015-04-28 NOTE — Progress Notes (Signed)
Ok to treat with Velcade and Darzalex with platelets of 18 per Dr Marin Olp.

## 2015-04-29 ENCOUNTER — Ambulatory Visit: Payer: BLUE CROSS/BLUE SHIELD

## 2015-04-29 ENCOUNTER — Encounter: Payer: Self-pay | Admitting: Radiation Oncology

## 2015-04-29 ENCOUNTER — Other Ambulatory Visit: Payer: BLUE CROSS/BLUE SHIELD

## 2015-04-29 NOTE — Progress Notes (Signed)
Faxed completed paperwork and office notes to [facility], [phone]

## 2015-04-30 ENCOUNTER — Telehealth: Payer: Self-pay

## 2015-05-01 ENCOUNTER — Telehealth: Payer: Self-pay | Admitting: *Deleted

## 2015-05-01 NOTE — Telephone Encounter (Signed)
Patient would like to know what his dosing should be for Singulair now that he has started his new treatment. Spoke to Dr Marin Olp who wants patient to take Singulair daily.

## 2015-05-02 ENCOUNTER — Encounter (HOSPITAL_COMMUNITY): Payer: Self-pay

## 2015-05-02 ENCOUNTER — Emergency Department (HOSPITAL_COMMUNITY)
Admission: EM | Admit: 2015-05-02 | Discharge: 2015-05-02 | Disposition: A | Discharge status: Home / Self Care | Payer: BLUE CROSS/BLUE SHIELD | Attending: Emergency Medicine | Admitting: Emergency Medicine

## 2015-05-02 ENCOUNTER — Emergency Department (HOSPITAL_COMMUNITY): Payer: BLUE CROSS/BLUE SHIELD

## 2015-05-02 DIAGNOSIS — R05 Cough: Secondary | ICD-10-CM | POA: Diagnosis present

## 2015-05-02 DIAGNOSIS — M545 Low back pain: Secondary | ICD-10-CM | POA: Insufficient documentation

## 2015-05-02 DIAGNOSIS — Z79899 Other long term (current) drug therapy: Secondary | ICD-10-CM | POA: Insufficient documentation

## 2015-05-02 DIAGNOSIS — K219 Gastro-esophageal reflux disease without esophagitis: Secondary | ICD-10-CM | POA: Diagnosis not present

## 2015-05-02 DIAGNOSIS — J069 Acute upper respiratory infection, unspecified: Secondary | ICD-10-CM | POA: Insufficient documentation

## 2015-05-02 DIAGNOSIS — Z87891 Personal history of nicotine dependence: Secondary | ICD-10-CM | POA: Insufficient documentation

## 2015-05-02 DIAGNOSIS — R197 Diarrhea, unspecified: Secondary | ICD-10-CM | POA: Diagnosis not present

## 2015-05-02 DIAGNOSIS — Z8579 Personal history of other malignant neoplasms of lymphoid, hematopoietic and related tissues: Secondary | ICD-10-CM | POA: Insufficient documentation

## 2015-05-02 DIAGNOSIS — D849 Immunodeficiency, unspecified: Secondary | ICD-10-CM | POA: Insufficient documentation

## 2015-05-02 LAB — BASIC METABOLIC PANEL
Anion gap: 6 (ref 5–15)
BUN: 13 mg/dL (ref 6–20)
CALCIUM: 8.5 mg/dL — AB (ref 8.9–10.3)
CHLORIDE: 110 mmol/L (ref 101–111)
CO2: 27 mmol/L (ref 22–32)
CREATININE: 0.89 mg/dL (ref 0.61–1.24)
Glucose, Bld: 104 mg/dL — ABNORMAL HIGH (ref 65–99)
Potassium: 3.6 mmol/L (ref 3.5–5.1)
SODIUM: 143 mmol/L (ref 135–145)

## 2015-05-02 LAB — CBC WITH DIFFERENTIAL/PLATELET
BASOS PCT: 0 %
Basophils Absolute: 0 10*3/uL (ref 0.0–0.1)
EOS ABS: 0 10*3/uL (ref 0.0–0.7)
Eosinophils Relative: 0 %
HCT: 27.1 % — ABNORMAL LOW (ref 39.0–52.0)
HEMOGLOBIN: 9.1 g/dL — AB (ref 13.0–17.0)
Lymphocytes Relative: 15 %
Lymphs Abs: 0.4 10*3/uL — ABNORMAL LOW (ref 0.7–4.0)
MCH: 29.5 pg (ref 26.0–34.0)
MCHC: 33.6 g/dL (ref 30.0–36.0)
MCV: 88 fL (ref 78.0–100.0)
MONOS PCT: 16 %
Monocytes Absolute: 0.5 10*3/uL (ref 0.1–1.0)
NEUTROS PCT: 69 %
Neutro Abs: 2 10*3/uL (ref 1.7–7.7)
PLATELETS: 32 10*3/uL — AB (ref 150–400)
RBC: 3.08 MIL/uL — ABNORMAL LOW (ref 4.22–5.81)
RDW: 17 % — ABNORMAL HIGH (ref 11.5–15.5)
WBC: 2.9 10*3/uL — AB (ref 4.0–10.5)

## 2015-05-02 MED ORDER — HEPARIN SOD (PORK) LOCK FLUSH 100 UNIT/ML IV SOLN
500.0000 [IU] | Freq: Once | INTRAVENOUS | Status: AC
  Administered 2015-05-02: 500 [IU]
  Filled 2015-05-02: qty 5

## 2015-05-02 MED ORDER — HYDROMORPHONE HCL 1 MG/ML IJ SOLN
1.0000 mg | Freq: Once | INTRAMUSCULAR | Status: AC
  Administered 2015-05-02: 1 mg via INTRAVENOUS
  Filled 2015-05-02: qty 1

## 2015-05-02 MED ORDER — ONDANSETRON HCL 4 MG PO TABS
8.0000 mg | ORAL_TABLET | Freq: Once | ORAL | Status: DC
  Filled 2015-05-02: qty 2

## 2015-05-02 NOTE — ED Notes (Signed)
RN Junie Panning is going to access port

## 2015-05-02 NOTE — Discharge Instructions (Signed)
Cough, Adult ° A cough is a reflex that helps clear your throat and airways. It can help heal the body or may be a reaction to an irritated airway. A cough may only last 2 or 3 weeks (acute) or may last more than 8 weeks (chronic).  °CAUSES °Acute cough: °· Viral or bacterial infections. °Chronic cough: °· Infections. °· Allergies. °· Asthma. °· Post-nasal drip. °· Smoking. °· Heartburn or acid reflux. °· Some medicines. °· Chronic lung problems (COPD). °· Cancer. °SYMPTOMS  °· Cough. °· Fever. °· Chest pain. °· Increased breathing rate. °· High-pitched whistling sound when breathing (wheezing). °· Colored mucus that you cough up (sputum). °TREATMENT  °· A bacterial cough may be treated with antibiotic medicine. °· A viral cough must run its course and will not respond to antibiotics. °· Your caregiver may recommend other treatments if you have a chronic cough. °HOME CARE INSTRUCTIONS  °· Only take over-the-counter or prescription medicines for pain, discomfort, or fever as directed by your caregiver. Use cough suppressants only as directed by your caregiver. °· Use a cold steam vaporizer or humidifier in your bedroom or home to help loosen secretions. °· Sleep in a semi-upright position if your cough is worse at night. °· Rest as needed. °· Stop smoking if you smoke. °SEEK IMMEDIATE MEDICAL CARE IF:  °· You have pus in your sputum. °· Your cough starts to worsen. °· You cannot control your cough with suppressants and are losing sleep. °· You begin coughing up blood. °· You have difficulty breathing. °· You develop pain which is getting worse or is uncontrolled with medicine. °· You have a fever. °MAKE SURE YOU:  °· Understand these instructions. °· Will watch your condition. °· Will get help right away if you are not doing well or get worse. °Document Released: 01/14/2011 Document Revised: 10/10/2011 Document Reviewed: 01/14/2011 °ExitCare® Patient Information ©2015 [employer]. This information is not intended  to replace advice given to you by your health care provider. Make sure you discuss any questions you have with your health care provider. °Upper Respiratory Infection, Adult °An upper respiratory infection (URI) is also sometimes known as the common cold. The upper respiratory tract includes the nose, sinuses, throat, trachea, and bronchi. Bronchi are the airways leading to the lungs. Most people improve within 1 week, but symptoms can last up to 2 weeks. A residual cough may last even longer.  °CAUSES °Many different viruses can infect the tissues lining the upper respiratory tract. The tissues become irritated and inflamed and often become very moist. Mucus production is also common. A cold is contagious. You can easily spread the virus to others by oral contact. This includes kissing, sharing a glass, coughing, or sneezing. Touching your mouth or nose and then touching a surface, which is then touched by another person, can also spread the virus. °SYMPTOMS  °Symptoms typically develop 1 to 3 days after you come in contact with a cold virus. Symptoms vary from person to person. They may include: °· Runny nose. °· Sneezing. °· Nasal congestion. °· Sinus irritation. °· Sore throat. °· Loss of voice (laryngitis). °· Cough. °· Fatigue. °· Muscle aches. °· Loss of appetite. °· Headache. °· Low-grade fever. °DIAGNOSIS  °You might diagnose your own cold based on familiar symptoms, since most people get a cold 2 to 3 times a year. Your caregiver can confirm this based on your exam. Most importantly, your caregiver can check that your symptoms are not due to another disease such   as strep throat, sinusitis, pneumonia, asthma, or epiglottitis. Blood tests, throat tests, and X-rays are not necessary to diagnose a common cold, but they may sometimes be helpful in excluding other more serious diseases. Your caregiver will decide if any further tests are required. °RISKS AND COMPLICATIONS  °You may be at risk for a more severe  case of the common cold if you smoke cigarettes, have chronic heart disease (such as heart failure) or lung disease (such as asthma), or if you have a weakened immune system. The very young and very old are also at risk for more serious infections. Bacterial sinusitis, middle ear infections, and bacterial pneumonia can complicate the common cold. The common cold can worsen asthma and chronic obstructive pulmonary disease (COPD). Sometimes, these complications can require emergency medical care and may be life-threatening. °PREVENTION  °The best way to protect against getting a cold is to practice good hygiene. Avoid oral or hand contact with people with cold symptoms. Wash your hands often if contact occurs. There is no clear evidence that vitamin C, vitamin E, echinacea, or exercise reduces the chance of developing a cold. However, it is always recommended to get plenty of rest and practice good nutrition. °TREATMENT  °Treatment is directed at relieving symptoms. There is no cure. Antibiotics are not effective, because the infection is caused by a virus, not by bacteria. Treatment may include: °· Increased fluid intake. Sports drinks offer valuable electrolytes, sugars, and fluids. °· Breathing heated mist or steam (vaporizer or shower). °· Eating chicken soup or other clear broths, and maintaining good nutrition. °· Getting plenty of rest. °· Using gargles or lozenges for comfort. °· Controlling fevers with ibuprofen or acetaminophen as directed by your caregiver. °· Increasing usage of your inhaler if you have asthma. °Zinc gel and zinc lozenges, taken in the first 24 hours of the common cold, can shorten the duration and lessen the severity of symptoms. Pain medicines may help with fever, muscle aches, and throat pain. A variety of non-prescription medicines are available to treat congestion and runny nose. Your caregiver can make recommendations and may suggest nasal or lung inhalers for other symptoms.  °HOME  CARE INSTRUCTIONS  °· Only take over-the-counter or prescription medicines for pain, discomfort, or fever as directed by your caregiver. °· Use a warm mist humidifier or inhale steam from a shower to increase air moisture. This may keep secretions moist and make it easier to breathe. °· Drink enough water and fluids to keep your urine clear or pale yellow. °· Rest as needed. °· Return to work when your temperature has returned to normal or as your caregiver advises. You may need to stay home longer to avoid infecting others. You can also use a face mask and careful hand washing to prevent spread of the virus. °SEEK MEDICAL CARE IF:  °· After the first few days, you feel you are getting worse rather than better. °· You need your caregiver's advice about medicines to control symptoms. °· You develop chills, worsening shortness of breath, or brown or red sputum. These may be signs of pneumonia. °· You develop yellow or brown nasal discharge or pain in the face, especially when you bend forward. These may be signs of sinusitis. °· You develop a fever, swollen neck glands, pain with swallowing, or white areas in the back of your throat. These may be signs of strep throat. °SEEK IMMEDIATE MEDICAL CARE IF:  °· You have a fever. °· You develop severe or persistent headache, ear   pain, sinus pain, or chest pain. °· You develop wheezing, a prolonged cough, cough up blood, or have a change in your usual mucus (if you have chronic lung disease). °· You develop sore muscles or a stiff neck. °Document Released: 01/11/2001 Document Revised: 10/10/2011 Document Reviewed: 10/23/2013 °ExitCare® Patient Information ©2015 [employer]. This information is not intended to replace advice given to you by your health care provider. Make sure you discuss any questions you have with your health care provider. ° °

## 2015-05-02 NOTE — ED Notes (Signed)
Patient transported to X-ray 

## 2015-05-02 NOTE — ED Provider Notes (Signed)
CSN: [id number]     Arrival date & time 05/02/15  1706 History   First MD Initiated Contact with Patient 05/02/15 1717     Chief Complaint  Patient presents with  . URI   HPI  Mark [name] is a 44 year old male currently being treated with daratumumab for multiple myeloma presenting with congestion, cough and diarrhea. Pt states that he has had nasal congestion and chest congestion for the past week. He reports a low grade fever over the past week of "around 99". He has had a productive cough with non-bloody yellow sputum. He tried mucinex yesterday without relief. He states that he is very concerned that he has pneumonia due to his MM and chemotherapy regimen. Endorses feeling more fatigued than usual. Denies chills, diaphoresis, headaches, sinus pressure, ear pain, rhinorrhea, sore throat, chest pain, SOB, abdominal pain or vomiting. Pt also reports nausea and diarrhea for the past 2 days after his most recent daratumumab infusion which was 4 days ago. He reports that he typically gets diarrhea as a side effect of this chemotherapy. He reports multiple loose, non-bloody stools per day. Denies lightheadedness, dizziness or syncope. He is also complaining of chronic, aching back pain and lower extremity neuropathy that he has not taken his home pain medications for yet. He has a portacath placed in right chest wall. Denies color changes or drainage from the port.   Past Medical History  Diagnosis Date  . History of radiation therapy 02/07/13- 02/26/13    lower L spine, upper sacrum, 35 gray in 14 fractions  . FH: chemotherapy     Dr. Burney Gauze  . Cancer ([place])   . Multiple myeloma ([place]) dx'd 01/2013  . GERD (gastroesophageal reflux disease)    Past Surgical History  Procedure Laterality Date  . Portacath placement    . 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 Kyphoplasty  05/20/14  . Lumbar laminectomy/decompression microdiscectomy N/A 09/25/2014    Procedure: LUMBAR  LAMINECTOMY/DECOMPRESSION MICRODISCECTOMY;  Surgeon: Sinclair Ship, MD;  Location: [facility];  Service: Orthopedics;  Laterality: N/A;  Lumbar 4-5, L5-S1  decompression  . Esophagogastroduodenoscopy N/A 02/06/2015    Procedure: ESOPHAGOGASTRODUODENOSCOPY (EGD);  Surgeon: Teena Irani, MD;  Location: [facility];  Service: Endoscopy;  Laterality: N/A;   Family History  Problem Relation Age of Onset  . Diabetic kidney disease Mother   . Hypertension Mother   . Heart attack Mother   . Kidney failure Mother   . Coronary artery disease Mother   . HIV Father    Social History  Substance Use Topics  . Smoking status: Former Smoker -- 1.00 packs/day for 15 years    Types: Cigarettes    Start date: 06/29/1988    Quit date: 06/29/2003  . Smokeless tobacco: Never Used     Comment: quit smoking 10 years ago  . Alcohol Use: No    Review of Systems  Constitutional: Positive for fatigue. Negative for fever and chills.  HENT: Positive for congestion. Negative for ear pain, postnasal drip, rhinorrhea, sore throat and trouble swallowing.   Eyes: Negative for pain and discharge.  Respiratory: Positive for cough. Negative for shortness of breath.   Cardiovascular: Negative for chest pain.  Gastrointestinal: Positive for nausea and diarrhea. Negative for vomiting and abdominal pain.  Musculoskeletal: Positive for back pain.  Skin: Negative for color change and rash.  Allergic/Immunologic: Positive for immunocompromised state.  Neurological: Negative for dizziness, syncope, light-headedness and headaches.  Allergies  Gadolinium derivatives  Home Medications   Prior to Admission medications   Medication Sig Start Date End Date Taking? Authorizing Provider  bisacodyl (DULCOLAX) 5 MG EC tablet Take 5 mg by mouth daily as needed for moderate constipation (constipation).    Yes Historical Provider, MD  famciclovir (FAMVIR) 500 MG tablet Take 1 tablet (500 mg total) by mouth daily. 02/11/15   Yes [name], MD  fentaNYL (DURAGESIC - DOSED MCG/HR) 75 MCG/HR Place 1 patch (75 mcg total) onto the skin every 3 (three) days. 12/09/14  Yes [name], MD  fluconazole (DIFLUCAN) 100 MG tablet Take 1 tablet (100 mg total) by mouth daily. 03/30/15  Yes [name], NP  gabapentin (NEURONTIN) 300 MG capsule Take 2 capsules (600 mg total) by mouth 3 (three) times daily. 11/20/14  Yes [name], NP  HYDROmorphone (DILAUDID) 4 MG tablet Take 1-2, IF NEEDED, for pain every 4 hrs for pain Patient taking differently: Take 4-8 mg by mouth every 4 (four) hours as needed for moderate pain or severe pain.  02/25/15  Yes [name], MD  Ipratropium-Albuterol (COMBIVENT) 20-100 MCG/ACT AERS respimat Inhale 1 puff into the lungs every 6 (six) hours. Patient taking differently: Inhale 1 puff into the lungs every 6 (six) hours as needed for wheezing or shortness of breath.  03/30/15  Yes Drain, NP  montelukast (SINGULAIR) 10 MG tablet Take 10 mg by mouth at bedtime.   Yes Historical Provider, MD  pantoprazole (PROTONIX) 40 MG tablet Take 1 tablet (40 mg total) by mouth daily. 02/04/15  Yes [name], MD  lidocaine-prilocaine (EMLA) cream Apply to affected area once 02/18/15   [name], MD  LORazepam (ATIVAN) 0.5 MG tablet Take 1 tablet (0.5 mg total) by mouth every 6 (six) hours as needed (Nausea or vomiting). 02/18/15   [name], MD  metoCLOPramide (REGLAN) 10 MG tablet Take 1 tablet (10 mg total) by mouth every 6 (six) hours as needed for nausea or vomiting. 01/27/15   Shanon Rosser, MD  ondansetron (ZOFRAN ODT) 8 MG disintegrating tablet Take 1 tablet (8 mg total) by mouth every 8 (eight) hours as needed for nausea or vomiting. 02/04/15   [name], MD  PRESCRIPTION MEDICATION Chemo at [facility].    Historical Provider, MD  prochlorperazine (COMPAZINE) 10 MG tablet Take 1 tablet (10 mg total) by mouth every 6 (six) hours as needed (Nausea or vomiting). 02/18/15    [name], MD  saccharomyces boulardii (FLORASTOR) 250 MG capsule Take 1 capsule (250 mg total) by mouth 2 (two) times daily. 03/09/15   Charlynne Cousins, MD  temazepam (RESTORIL) 22.5 MG capsule Take 1 capsule (22.5 mg total) by mouth at bedtime as needed for sleep. 02/04/15   [name], MD   BP 126/72 mmHg  Pulse 98  Temp(Src) 99 F (37.2 C) (Oral)  Resp 20  SpO2 98% Physical Exam  Constitutional: He appears well-developed and well-nourished. No distress.  HENT:  Head: Normocephalic and atraumatic.  Nose: Mucosal edema present.  Mouth/Throat: Uvula is midline, oropharynx is clear and moist and mucous membranes are normal. No oropharyngeal exudate, posterior oropharyngeal edema or posterior oropharyngeal erythema.  Moist mucus membranes  Eyes: Conjunctivae are normal. Right eye exhibits no discharge. Left eye exhibits no discharge. No scleral icterus.  Neck: Normal range of motion. Neck supple.  Cardiovascular: Normal rate, regular rhythm and normal heart sounds.   Pulmonary/Chest: Effort normal and  breath sounds normal. No respiratory distress. He has no wheezes. He has no rales.  Pt breathing unlabored. Lungs CTAB. Portacath of right anterior chest wall. No erythema or drainage from site.   Abdominal: Soft. Bowel sounds are normal. He exhibits no distension. There is no tenderness. There is no rebound and no guarding.  Musculoskeletal: Normal range of motion.  Pt moving extremities spontaneously.   Lymphadenopathy:    He has no cervical adenopathy.  Neurological: He is alert. Coordination normal.  Skin: Skin is warm and dry.  Psychiatric: He has a normal mood and affect. His behavior is normal.  Nursing note and vitals reviewed.   ED Course  Procedures (including critical care time) Labs Review Labs Reviewed  CBC WITH DIFFERENTIAL/PLATELET - Abnormal; Notable for the following:    WBC 2.9 (*)    RBC 3.08 (*)    Hemoglobin 9.1 (*)    HCT 27.1 (*)    RDW 17.0  (*)    Platelets 32 (*)    Lymphs Abs 0.4 (*)    All other components within normal limits  BASIC METABOLIC PANEL - Abnormal; Notable for the following:    Glucose, Bld 104 (*)    Calcium 8.5 (*)    All other components within normal limits    Imaging Review Dg Chest 2 View  05/02/2015   CLINICAL DATA:  44 year old male with cough, low-grade fever and chest pain for 2 days. History of multiple myeloma and recent chemotherapy.  EXAM: CHEST  2 VIEW  COMPARISON:  None.  FINDINGS: The cardiomediastinal silhouette is unremarkable.  A a right Port-A-Cath is again identified with tip overlying the superior cavoatrial junction.  The soft tissue mass associated with the left fourth rib is decreased in size from 04/08/2015.  There is no evidence of airspace disease, pleural effusion, pulmonary edema or pneumothorax.  No new bony abnormalities are identified.  IMPRESSION: No evidence of acute abnormality.  Decreased size of soft tissue mass associated with the anterior left fourth rib.   Electronically Signed   By: Margarette Canada M.D.   On: 05/02/2015 19:42   I have personally reviewed and evaluated these images and lab results as part of my medical decision-making.   EKG Interpretation None      MDM   Final diagnoses:  URI (upper respiratory infection)  Diarrhea, unspecified type   Pt presenting with nasal congestion and cough x 1 week. Pt also complaining of diarrhea and nausea post-chemotherapy. Pt tachycardic in triage to 123 but remained between 95-104 for remaining ED stay. Afebrile. Pt is nontoxic appearing and NAD. Breathing unlabored and lungs CTAB. Abdomen is soft, non-tender. CXR negative for acute disease. WBC 2.9 and platelets 32 which appear to be baseline for him over the past year. Likely URI. No electrolyte abnormalities from diarrhea. Diarrhea and nausea likely his typical post-chemotherapy symptoms. Pt has follow up with his oncologist on 10/4. Pt is happy that he does not have  pneumonia and states he feels comfortable using OTC cold/flu medications and following up with his oncologist. Return precautions given in discharge paperwork and discussed with pt at bedside. Pt stable for discharge     Josephina Gip, PA-C 05/03/15 0024  Virgel Manifold, MD 05/07/15 1434

## 2015-05-02 NOTE — ED Notes (Signed)
He c/o cough/low grade fever and few diarrhea stools x 2-3 days.  He states he is concerned d/t having multiple myeloma with recent chemo. Treatments.

## 2015-05-04 ENCOUNTER — Other Ambulatory Visit: Payer: Self-pay | Admitting: *Deleted

## 2015-05-04 DIAGNOSIS — C9000 Multiple myeloma not having achieved remission: Secondary | ICD-10-CM

## 2015-05-05 ENCOUNTER — Other Ambulatory Visit: Payer: Self-pay | Admitting: *Deleted

## 2015-05-05 ENCOUNTER — Other Ambulatory Visit: Payer: Self-pay | Admitting: Hematology & Oncology

## 2015-05-05 ENCOUNTER — Ambulatory Visit (HOSPITAL_BASED_OUTPATIENT_CLINIC_OR_DEPARTMENT_OTHER): Payer: BLUE CROSS/BLUE SHIELD

## 2015-05-05 ENCOUNTER — Other Ambulatory Visit (HOSPITAL_BASED_OUTPATIENT_CLINIC_OR_DEPARTMENT_OTHER): Payer: BLUE CROSS/BLUE SHIELD

## 2015-05-05 VITALS — BP 100/61 | HR 88 | Temp 98.2°F | Resp 18

## 2015-05-05 DIAGNOSIS — M25551 Pain in right hip: Secondary | ICD-10-CM

## 2015-05-05 DIAGNOSIS — C9000 Multiple myeloma not having achieved remission: Secondary | ICD-10-CM

## 2015-05-05 DIAGNOSIS — Z5112 Encounter for antineoplastic immunotherapy: Secondary | ICD-10-CM | POA: Diagnosis not present

## 2015-05-05 DIAGNOSIS — G4701 Insomnia due to medical condition: Secondary | ICD-10-CM

## 2015-05-05 DIAGNOSIS — C9002 Multiple myeloma in relapse: Secondary | ICD-10-CM

## 2015-05-05 DIAGNOSIS — Z5111 Encounter for antineoplastic chemotherapy: Secondary | ICD-10-CM

## 2015-05-05 DIAGNOSIS — R112 Nausea with vomiting, unspecified: Secondary | ICD-10-CM

## 2015-05-05 LAB — CMP (CANCER CENTER ONLY)
ALBUMIN: 3.8 g/dL (ref 3.3–5.5)
ALK PHOS: 63 U/L (ref 26–84)
ALT: 24 U/L (ref 10–47)
AST: 24 U/L (ref 11–38)
BUN: 16 mg/dL (ref 7–22)
CALCIUM: 9 mg/dL (ref 8.0–10.3)
CHLORIDE: 109 meq/L — AB (ref 98–108)
CO2: 20 mEq/L (ref 18–33)
Creat: 1 mg/dl (ref 0.6–1.2)
Glucose, Bld: 199 mg/dL — ABNORMAL HIGH (ref 73–118)
POTASSIUM: 4.1 meq/L (ref 3.3–4.7)
Sodium: 143 mEq/L (ref 128–145)
TOTAL PROTEIN: 6.9 g/dL (ref 6.4–8.1)
Total Bilirubin: 0.8 mg/dl (ref 0.20–1.60)

## 2015-05-05 LAB — HOLD TUBE, BLOOD BANK - CHCC SATELLITE

## 2015-05-05 LAB — CBC WITH DIFFERENTIAL (CANCER CENTER ONLY)
BASO#: 0 10*3/uL (ref 0.0–0.2)
BASO%: 0.3 % (ref 0.0–2.0)
EOS ABS: 0 10*3/uL (ref 0.0–0.5)
EOS%: 0 % (ref 0.0–7.0)
HCT: 28.8 % — ABNORMAL LOW (ref 38.7–49.9)
HEMOGLOBIN: 9.5 g/dL — AB (ref 13.0–17.1)
LYMPH#: 0.6 10*3/uL — ABNORMAL LOW (ref 0.9–3.3)
LYMPH%: 18.6 % (ref 14.0–48.0)
MCH: 30 pg (ref 28.0–33.4)
MCHC: 33 g/dL (ref 32.0–35.9)
MCV: 91 fL (ref 82–98)
MONO#: 0.8 10*3/uL (ref 0.1–0.9)
MONO%: 22.8 % — AB (ref 0.0–13.0)
NEUT%: 58.3 % (ref 40.0–80.0)
NEUTROS ABS: 2 10*3/uL (ref 1.5–6.5)
Platelets: 40 10*3/uL — ABNORMAL LOW (ref 145–400)
RBC: 3.17 10*6/uL — ABNORMAL LOW (ref 4.20–5.70)
RDW: 17.4 % — ABNORMAL HIGH (ref 11.1–15.7)
WBC: 3.4 10*3/uL — ABNORMAL LOW (ref 4.0–10.0)

## 2015-05-05 MED ORDER — SACCHAROMYCES BOULARDII 250 MG PO CAPS: 250.0000 mg | ORAL_CAPSULE | Freq: Two times a day (BID) | ORAL | Status: DC

## 2015-05-05 MED ORDER — PANTOPRAZOLE SODIUM 40 MG PO TBEC: 40.0000 mg | DELAYED_RELEASE_TABLET | Freq: Every day | ORAL | Status: AC

## 2015-05-05 MED ORDER — DIPHENHYDRAMINE HCL 25 MG PO CAPS
ORAL_CAPSULE | ORAL | Status: AC
Start: 2015-05-05 — End: 2015-05-05
  Filled 2015-05-05: qty 2

## 2015-05-05 MED ORDER — DIPHENHYDRAMINE HCL 25 MG PO CAPS
50.0000 mg | ORAL_CAPSULE | Freq: Once | ORAL | Status: AC
  Administered 2015-05-05: 50 mg via ORAL

## 2015-05-05 MED ORDER — HYDROMORPHONE HCL 4 MG/ML IJ SOLN
3.0000 mg | Freq: Once | INTRAMUSCULAR | Status: AC
  Administered 2015-05-05: 3 mg via INTRAVENOUS

## 2015-05-05 MED ORDER — METHYLPREDNISOLONE SODIUM SUCC 125 MG IJ SOLR
INTRAMUSCULAR | Status: AC
  Filled 2015-05-05: qty 2

## 2015-05-05 MED ORDER — METHYLPREDNISOLONE SODIUM SUCC 125 MG IJ SOLR
125.0000 mg | Freq: Once | INTRAMUSCULAR | Status: AC
  Administered 2015-05-05: 125 mg via INTRAVENOUS

## 2015-05-05 MED ORDER — HEPARIN SOD (PORK) LOCK FLUSH 100 UNIT/ML IV SOLN
500.0000 [IU] | Freq: Once | INTRAVENOUS | Status: AC | PRN
  Administered 2015-05-05: 500 [IU]
  Filled 2015-05-05: qty 5

## 2015-05-05 MED ORDER — ZOLEDRONIC ACID 4 MG/100ML IV SOLN
4.0000 mg | Freq: Once | INTRAVENOUS | Status: AC
  Administered 2015-05-05: 4 mg via INTRAVENOUS
  Filled 2015-05-05: qty 100

## 2015-05-05 MED ORDER — SODIUM CHLORIDE 0.9 % IV SOLN
Freq: Once | INTRAVENOUS | Status: AC
  Administered 2015-05-05: 10:00:00 via INTRAVENOUS
  Filled 2015-05-05: qty 4

## 2015-05-05 MED ORDER — BORTEZOMIB CHEMO SQ INJECTION 3.5 MG (2.5MG/ML)
1.3000 mg/m2 | Freq: Once | INTRAMUSCULAR | Status: AC
  Administered 2015-05-05: 2.75 mg via SUBCUTANEOUS
  Filled 2015-05-05: qty 2.75

## 2015-05-05 MED ORDER — SODIUM CHLORIDE 0.9 % IJ SOLN
10.0000 mL | INTRAMUSCULAR | Status: DC | PRN
  Administered 2015-05-05: 10 mL
  Filled 2015-05-05: qty 10

## 2015-05-05 MED ORDER — GABAPENTIN 300 MG PO CAPS: 600.0000 mg | ORAL_CAPSULE | Freq: Three times a day (TID) | ORAL | Status: AC

## 2015-05-05 MED ORDER — ACETAMINOPHEN 325 MG PO TABS
650.0000 mg | ORAL_TABLET | Freq: Once | ORAL | Status: AC
  Administered 2015-05-05: 650 mg via ORAL

## 2015-05-05 MED ORDER — HYDROMORPHONE HCL 4 MG/ML IJ SOLN
INTRAMUSCULAR | Status: AC
  Filled 2015-05-05: qty 1

## 2015-05-05 MED ORDER — FENTANYL 75 MCG/HR TD PT72: 75.0000 ug | MEDICATED_PATCH | TRANSDERMAL | Status: DC

## 2015-05-05 MED ORDER — SODIUM CHLORIDE 0.9 % IV SOLN
15.9000 mg/kg | Freq: Once | INTRAVENOUS | Status: AC
  Administered 2015-05-05: 1500 mg via INTRAVENOUS
  Filled 2015-05-05: qty 75

## 2015-05-05 MED ORDER — SODIUM CHLORIDE 0.9 % IV SOLN
Freq: Once | INTRAVENOUS | Status: AC
  Administered 2015-05-05: 08:00:00 via INTRAVENOUS

## 2015-05-05 MED ORDER — ACETAMINOPHEN 325 MG PO TABS
ORAL_TABLET | ORAL | Status: AC
Start: 2015-05-05 — End: 2015-05-05
  Filled 2015-05-05: qty 2

## 2015-05-05 NOTE — Patient Instructions (Signed)
[facility] Discharge Instructions for Patients Receiving Chemotherapy  Today you received the following chemotherapy agents Darzalex  To help prevent nausea and vomiting after your treatment, we encourage you to take your nausea medication    If you develop nausea and vomiting that is not controlled by your nausea medication, call the clinic.   BELOW ARE SYMPTOMS THAT SHOULD BE REPORTED IMMEDIATELY:  *FEVER GREATER THAN 100.5 F  *CHILLS WITH OR WITHOUT FEVER  NAUSEA AND VOMITING THAT IS NOT CONTROLLED WITH YOUR NAUSEA MEDICATION  *UNUSUAL SHORTNESS OF BREATH  *UNUSUAL BRUISING OR BLEEDING  TENDERNESS IN MOUTH AND THROAT WITH OR WITHOUT PRESENCE OF ULCERS  *URINARY PROBLEMS  *BOWEL PROBLEMS  UNUSUAL RASH Items with * indicate a potential emergency and should be followed up as soon as possible.  Feel free to call the clinic you have any questions or concerns. The clinic phone number is [phone].  Please show the CHEMO ALERT CARD at check-in to the Emergency Department and triage nurse.   

## 2015-05-08 ENCOUNTER — Ambulatory Visit (HOSPITAL_BASED_OUTPATIENT_CLINIC_OR_DEPARTMENT_OTHER): Payer: BLUE CROSS/BLUE SHIELD

## 2015-05-08 VITALS — BP 116/75 | HR 93 | Temp 98.4°F | Resp 18

## 2015-05-08 DIAGNOSIS — Z5112 Encounter for antineoplastic immunotherapy: Secondary | ICD-10-CM | POA: Diagnosis not present

## 2015-05-08 DIAGNOSIS — C9000 Multiple myeloma not having achieved remission: Secondary | ICD-10-CM

## 2015-05-08 MED ORDER — ONDANSETRON HCL 8 MG PO TABS
8.0000 mg | ORAL_TABLET | Freq: Once | ORAL | Status: AC
  Administered 2015-05-08: 8 mg via ORAL

## 2015-05-08 MED ORDER — BORTEZOMIB CHEMO SQ INJECTION 3.5 MG (2.5MG/ML)
1.3000 mg/m2 | Freq: Once | INTRAMUSCULAR | Status: AC
  Administered 2015-05-08: 2.75 mg via SUBCUTANEOUS
  Filled 2015-05-08: qty 2.75

## 2015-05-08 MED ORDER — ONDANSETRON HCL 8 MG PO TABS
ORAL_TABLET | ORAL | Status: AC
  Filled 2015-05-08: qty 1

## 2015-05-08 NOTE — Patient Instructions (Signed)
Bortezomib injection What is this medicine? BORTEZOMIB (bor TEZ oh mib) is a medicine that targets proteins in cancer cells and stops the cancer cells from growing. It is used to treat multiple myeloma and mantle-cell lymphoma. This medicine may be used for other purposes; ask your health care provider or pharmacist if you have questions. What should I tell my health care provider before I take this medicine? They need to know if you have any of these conditions: -diabetes -heart disease -irregular heartbeat -liver disease -on hemodialysis -low blood counts, like low white blood cells, platelets, or hemoglobin -peripheral neuropathy -taking medicine for blood pressure -an unusual or allergic reaction to bortezomib, mannitol, boron, other medicines, foods, dyes, or preservatives -pregnant or trying to get pregnant -breast-feeding How should I use this medicine? This medicine is for injection into a vein or for injection under the skin. It is given by a health care professional in a hospital or clinic setting. Talk to your pediatrician regarding the use of this medicine in children. Special care may be needed. Overdosage: If you think you have taken too much of this medicine contact a poison control center or emergency room at once. NOTE: This medicine is only for you. Do not share this medicine with others. What if I miss a dose? It is important not to miss your dose. Call your doctor or health care professional if you are unable to keep an appointment. What may interact with this medicine? This medicine may interact with the following medications: -ketoconazole -rifampin -ritonavir -St. John's Wort This list may not describe all possible interactions. Give your health care provider a list of all the medicines, herbs, non-prescription drugs, or dietary supplements you use. Also tell them if you smoke, drink alcohol, or use illegal drugs. Some items may interact with your medicine. What  should I watch for while using this medicine? Visit your doctor for checks on your progress. This drug may make you feel generally unwell. This is not uncommon, as chemotherapy can affect healthy cells as well as cancer cells. Report any side effects. Continue your course of treatment even though you feel ill unless your doctor tells you to stop. You may get drowsy or dizzy. Do not drive, use machinery, or do anything that needs mental alertness until you know how this medicine affects you. Do not stand or sit up quickly, especially if you are an older patient. This reduces the risk of dizzy or fainting spells. In some cases, you may be given additional medicines to help with side effects. Follow all directions for their use. Call your doctor or health care professional for advice if you get a fever, chills or sore throat, or other symptoms of a cold or flu. Do not treat yourself. This drug decreases your body's ability to fight infections. Try to avoid being around people who are sick. This medicine may increase your risk to bruise or bleed. Call your doctor or health care professional if you notice any unusual bleeding. You may need blood work done while you are taking this medicine. In some patients, this medicine may cause a serious brain infection that may cause death. If you have any problems seeing, thinking, speaking, walking, or standing, tell your doctor right away. If you cannot reach your doctor, urgently seek other source of medical care. Do not become pregnant while taking this medicine. Women should inform their doctor if they wish to become pregnant or think they might be pregnant. There is a potential for serious  side effects to an unborn child. Talk to your health care professional or pharmacist for more information. Do not breast-feed an infant while taking this medicine. Check with your doctor or health care professional if you get an attack of severe diarrhea, nausea and vomiting, or if  you sweat a lot. The loss of too much body fluid can make it dangerous for you to take this medicine. What side effects may I notice from receiving this medicine? Side effects that you should report to your doctor or health care professional as soon as possible: -allergic reactions like skin rash, itching or hives, swelling of the face, lips, or tongue -breathing problems -changes in hearing -changes in vision -fast, irregular heartbeat -feeling faint or lightheaded, falls -pain, tingling, numbness in the hands or feet -right upper belly pain -seizures -swelling of the ankles, feet, hands -unusual bleeding or bruising -unusually weak or tired -vomiting -yellowing of the eyes or skin Side effects that usually do not require medical attention (report to your doctor or health care professional if they continue or are bothersome): -changes in emotions or moods -constipation -diarrhea -loss of appetite -headache -irritation at site where injected -nausea This list may not describe all possible side effects. Call your doctor for medical advice about side effects. You may report side effects to FDA at 1-800-FDA-1088. Where should I keep my medicine? This drug is given in a hospital or clinic and will not be stored at home. NOTE: This sheet is a summary. It may not cover all possible information. If you have questions about this medicine, talk to your doctor, pharmacist, or health care provider.    2016, Elsevier/Gold Standard. (2014-09-16 14:47:04)

## 2015-05-11 ENCOUNTER — Other Ambulatory Visit: Payer: Self-pay | Admitting: *Deleted

## 2015-05-11 DIAGNOSIS — C9000 Multiple myeloma not having achieved remission: Secondary | ICD-10-CM

## 2015-05-12 ENCOUNTER — Ambulatory Visit (HOSPITAL_BASED_OUTPATIENT_CLINIC_OR_DEPARTMENT_OTHER): Payer: BLUE CROSS/BLUE SHIELD

## 2015-05-12 ENCOUNTER — Other Ambulatory Visit (HOSPITAL_BASED_OUTPATIENT_CLINIC_OR_DEPARTMENT_OTHER): Payer: BLUE CROSS/BLUE SHIELD

## 2015-05-12 ENCOUNTER — Other Ambulatory Visit: Payer: Self-pay | Admitting: *Deleted

## 2015-05-12 ENCOUNTER — Ambulatory Visit (HOSPITAL_BASED_OUTPATIENT_CLINIC_OR_DEPARTMENT_OTHER)
Admission: RE | Admit: 2015-05-12 | Discharge: 2015-05-12 | Disposition: A | Discharge status: Home / Self Care | Payer: BLUE CROSS/BLUE SHIELD | Source: Ambulatory Visit | Attending: Family | Admitting: Family

## 2015-05-12 ENCOUNTER — Encounter: Payer: Self-pay | Admitting: Family

## 2015-05-12 ENCOUNTER — Ambulatory Visit (HOSPITAL_BASED_OUTPATIENT_CLINIC_OR_DEPARTMENT_OTHER): Payer: BLUE CROSS/BLUE SHIELD | Admitting: Family

## 2015-05-12 VITALS — BP 137/77 | HR 88 | Temp 97.9°F | Resp 18

## 2015-05-12 VITALS — BP 126/81 | HR 116 | Temp 98.3°F | Resp 16 | Ht 71.0 in | Wt 197.0 lb

## 2015-05-12 DIAGNOSIS — C9000 Multiple myeloma not having achieved remission: Secondary | ICD-10-CM

## 2015-05-12 DIAGNOSIS — C9002 Multiple myeloma in relapse: Secondary | ICD-10-CM

## 2015-05-12 DIAGNOSIS — R0602 Shortness of breath: Secondary | ICD-10-CM | POA: Insufficient documentation

## 2015-05-12 DIAGNOSIS — R05 Cough: Secondary | ICD-10-CM | POA: Diagnosis not present

## 2015-05-12 DIAGNOSIS — Z5112 Encounter for antineoplastic immunotherapy: Secondary | ICD-10-CM | POA: Diagnosis not present

## 2015-05-12 DIAGNOSIS — R222 Localized swelling, mass and lump, trunk: Secondary | ICD-10-CM | POA: Diagnosis not present

## 2015-05-12 LAB — CMP (CANCER CENTER ONLY)
ALBUMIN: 4.1 g/dL (ref 3.3–5.5)
ALT(SGPT): 24 U/L (ref 10–47)
AST: 26 U/L (ref 11–38)
Alkaline Phosphatase: 65 U/L (ref 26–84)
BUN: 12 mg/dL (ref 7–22)
CHLORIDE: 103 meq/L (ref 98–108)
CO2: 24 meq/L (ref 18–33)
CREATININE: 1.2 mg/dL (ref 0.6–1.2)
Calcium: 8.4 mg/dL (ref 8.0–10.3)
Glucose, Bld: 141 mg/dL — ABNORMAL HIGH (ref 73–118)
POTASSIUM: 3.2 meq/L — AB (ref 3.3–4.7)
SODIUM: 134 meq/L (ref 128–145)
Total Bilirubin: 0.9 mg/dl (ref 0.20–1.60)
Total Protein: 6.8 g/dL (ref 6.4–8.1)

## 2015-05-12 LAB — CBC WITH DIFFERENTIAL (CANCER CENTER ONLY)
BASO#: 0 10*3/uL (ref 0.0–0.2)
BASO%: 0.1 % (ref 0.0–2.0)
EOS ABS: 0 10*3/uL (ref 0.0–0.5)
EOS%: 0 % (ref 0.0–7.0)
HCT: 28 % — ABNORMAL LOW (ref 38.7–49.9)
HGB: 9.4 g/dL — ABNORMAL LOW (ref 13.0–17.1)
LYMPH#: 0.7 10*3/uL — ABNORMAL LOW (ref 0.9–3.3)
LYMPH%: 7.8 % — AB (ref 14.0–48.0)
MCH: 30.4 pg (ref 28.0–33.4)
MCHC: 33.6 g/dL (ref 32.0–35.9)
MCV: 91 fL (ref 82–98)
MONO#: 1.9 10*3/uL — AB (ref 0.1–0.9)
MONO%: 21.5 % — AB (ref 0.0–13.0)
NEUT#: 6.3 10*3/uL (ref 1.5–6.5)
NEUT%: 70.6 % (ref 40.0–80.0)
PLATELETS: 26 10*3/uL — AB (ref 145–400)
RBC: 3.09 10*6/uL — ABNORMAL LOW (ref 4.20–5.70)
RDW: 18.7 % — ABNORMAL HIGH (ref 11.1–15.7)
WBC: 8.9 10*3/uL (ref 4.0–10.0)

## 2015-05-12 MED ORDER — SODIUM CHLORIDE 0.9 % IV SOLN
Freq: Once | INTRAVENOUS | Status: AC
  Administered 2015-05-12: 12:00:00 via INTRAVENOUS
  Filled 2015-05-12: qty 4

## 2015-05-12 MED ORDER — HYDROMORPHONE HCL 4 MG/ML IJ SOLN
INTRAMUSCULAR | Status: AC
  Filled 2015-05-12: qty 1

## 2015-05-12 MED ORDER — HYDROMORPHONE HCL 4 MG/ML IJ SOLN
4.0000 mg | Freq: Once | INTRAMUSCULAR | Status: AC
  Administered 2015-05-12: 4 mg via INTRAVENOUS

## 2015-05-12 MED ORDER — METHYLPREDNISOLONE SODIUM SUCC 125 MG IJ SOLR
125.0000 mg | Freq: Once | INTRAMUSCULAR | Status: AC
  Administered 2015-05-12: 125 mg via INTRAVENOUS

## 2015-05-12 MED ORDER — SODIUM CHLORIDE 0.9 % IJ SOLN
10.0000 mL | INTRAMUSCULAR | Status: DC | PRN
  Administered 2015-05-12: 10 mL
  Filled 2015-05-12: qty 10

## 2015-05-12 MED ORDER — METHYLPREDNISOLONE SODIUM SUCC 125 MG IJ SOLR
INTRAMUSCULAR | Status: AC
  Filled 2015-05-12: qty 2

## 2015-05-12 MED ORDER — HEPARIN SOD (PORK) LOCK FLUSH 100 UNIT/ML IV SOLN
500.0000 [IU] | Freq: Once | INTRAVENOUS | Status: AC | PRN
  Administered 2015-05-12: 500 [IU]
  Filled 2015-05-12: qty 5

## 2015-05-12 MED ORDER — SODIUM CHLORIDE 0.9 % IV SOLN
Freq: Once | INTRAVENOUS | Status: AC
  Administered 2015-05-12: 10:00:00 via INTRAVENOUS

## 2015-05-12 MED ORDER — DIPHENHYDRAMINE HCL 25 MG PO CAPS
50.0000 mg | ORAL_CAPSULE | Freq: Once | ORAL | Status: AC
  Administered 2015-05-12: 50 mg via ORAL

## 2015-05-12 MED ORDER — DIPHENHYDRAMINE HCL 25 MG PO CAPS
ORAL_CAPSULE | ORAL | Status: AC
  Filled 2015-05-12: qty 2

## 2015-05-12 MED ORDER — ACETAMINOPHEN 325 MG PO TABS
650.0000 mg | ORAL_TABLET | Freq: Once | ORAL | Status: AC
  Administered 2015-05-12: 650 mg via ORAL

## 2015-05-12 MED ORDER — DARATUMUMAB CHEMO INJECTION 400 MG/20ML
15.9000 mg/kg | Freq: Once | INTRAVENOUS | Status: AC
  Administered 2015-05-12: 1500 mg via INTRAVENOUS
  Filled 2015-05-12: qty 60

## 2015-05-12 MED ORDER — ACETAMINOPHEN 325 MG PO TABS
ORAL_TABLET | ORAL | Status: AC
  Filled 2015-05-12: qty 2

## 2015-05-12 NOTE — Progress Notes (Signed)
Hematology and Oncology Follow Up Visit  Mark [name] [id number] [birth year] 44 y.o. 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 He is feeling fatigued today. He states that he was febrile yesterday at 101.9. He is afebrile today at 36.3.  He states that he has had some abdominal cramping and diarrhea and feels a little dehydrated. We will give him fluids while he is here today. He has not noticed any blood in his stool.  He has a productive cough with yellow sputum. We did a chest xray this morning and it was negative.  His wife has also been sick with a virus and has had similar symptoms.  His appetite is also decreased. He has had no episodes of nausea or vomiting. His weight is down 12 lbs since early September.  He states that he is having angry outbursts that he cannot control. No rash, dizziness, chest pain or palpitations.  He has SOB with exertion that is unchanged from his baseline.  No lymphadenopathy found on exam.  No swelling in his extremities. 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 His back pain is controlled at this time.  Medications:    Medication List       This list is accurate as of: 05/12/15  9:08 AM.  Always use your most recent med list.               bisacodyl 5 MG EC tablet  Commonly known as:  DULCOLAX  Take 5 mg by mouth daily as needed for moderate constipation (constipation).     famciclovir 500 MG tablet  Commonly known as:  FAMVIR  Take 1 tablet (500 mg total) by mouth daily.     fentaNYL 75 MCG/HR  Commonly known as:  DURAGESIC - dosed mcg/hr  Place 1 patch (75 mcg total) onto the skin every 3 (three) days.     fluconazole 100 MG tablet  Commonly known as:  DIFLUCAN  Take 1 tablet (100 mg total) by mouth  daily.     gabapentin 300 MG capsule  Commonly known as:  NEURONTIN  Take 2 capsules (600 mg total) by mouth 3 (three) times daily.     HYDROmorphone 4 MG tablet  Commonly known as:  DILAUDID  Take 1-2, IF NEEDED, for pain every 4 hrs for pain     Ipratropium-Albuterol 20-100 MCG/ACT Aers respimat  Commonly known as:  COMBIVENT  Inhale 1 puff into the lungs every 6 (six) hours.     lidocaine-prilocaine cream  Commonly known as:  EMLA  Apply to affected area once     LORazepam 0.5 MG tablet  Commonly known as:  ATIVAN  Take 1 tablet (0.5 mg total) by mouth every 6 (six) hours as needed (Nausea or vomiting).     metoCLOPramide 10 MG tablet  Commonly known as:  REGLAN  Take 1 tablet (10 mg total) by mouth every 6 (six) hours as needed for nausea or vomiting.     montelukast 10 MG tablet  Commonly known as:  SINGULAIR  Take 10 mg by mouth at bedtime.     ondansetron 8 MG disintegrating tablet  Commonly known as:  ZOFRAN ODT  Take 1 tablet (8 mg total) by mouth every 8 (eight) hours as  needed for nausea or vomiting.     pantoprazole 40 MG tablet  Commonly known as:  PROTONIX  Take 1 tablet (40 mg total) by mouth daily.     PRESCRIPTION MEDICATION  Chemo at [facility].     prochlorperazine 10 MG tablet  Commonly known as:  COMPAZINE  Take 1 tablet (10 mg total) by mouth every 6 (six) hours as needed (Nausea or vomiting).     saccharomyces boulardii 250 MG capsule  Commonly known as:  FLORASTOR  Take 1 capsule (250 mg total) by mouth 2 (two) times daily.     temazepam 22.5 MG capsule  Commonly known as:  RESTORIL  Take 1 capsule (22.5 mg total) by mouth at bedtime as needed for sleep.        Allergies:  Allergies  Allergen Reactions  . Gadolinium Derivatives Nausea And Vomiting    Pt became very nauseous and got sick.     Past Medical History, Surgical history, Social history, and Family History were reviewed and updated.  Review of Systems: All other 10 point  review of systems is negative.   Physical Exam:  height is 5\' 11"  (1.803 m) and weight is 197 lb (89.359 kg). His oral temperature is 98.3 F (36.8 C). His blood pressure is 126/81 and his pulse is 116. His respiration is 16.   Wt Readings from Last 3 Encounters:  05/12/15 197 lb (89.359 kg)  04/17/15 209 lb (94.802 kg)  04/12/15 204 lb (92.534 kg)    Ocular: Sclerae unicteric, pupils equal, round and reactive to light Ear-nose-throat: Oropharynx clear, dentition fair Lymphatic: No cervical or supraclavicular adenopathy Lungs no rales or rhonchi, good excursion bilaterally Heart regular rate and rhythm, no murmur appreciated Abd soft, nontender, positive bowel sounds MSK no focal spinal tenderness, no joint edema Neuro: non-focal, well-oriented, appropriate affect  Lab Results  Component Value Date   WBC 8.9 05/12/2015   HGB 9.4* 05/12/2015   HCT 28.0* 05/12/2015   MCV 91 05/12/2015   PLT 26* 05/12/2015   No results found for: FERRITIN, IRON, TIBC, UIBC, IRONPCTSAT Lab Results  Component Value Date   RBC 3.09* 05/12/2015   Lab Results  Component Value Date   KPAFRELGTCHN 24.40* 04/28/2015   LAMBDASER 0.03* 04/28/2015   KAPLAMBRATIO 813.33* 04/28/2015   Lab Results  Component Value Date   IGGSERUM 480* 04/07/2015   IGA 12* 04/07/2015   IGMSERUM 5* 04/07/2015   Lab Results  Component Value Date   TOTALPROTELP 6.6 04/07/2015   ALBUMINELP 4.4 04/07/2015   A1GS 0.3 04/07/2015   A2GS 0.7 04/07/2015   BETS 0.4 04/07/2015   BETA2SER 0.3 04/07/2015   GAMS 0.5* 04/07/2015   MSPIKE 0.23 09/22/2014   SPEI * 04/07/2015     Chemistry      Component Value Date/Time   NA 143 05/05/2015 0803   NA 143 05/02/2015 1850   NA 145 02/21/2013 0908   K 4.1 05/05/2015 0803   K 3.6 05/02/2015 1850   K 3.8 02/21/2013 0908   CL 109* 05/05/2015 0803   CL 110 05/02/2015 1850   CO2 20 05/05/2015 0803   CO2 27 05/02/2015 1850   CO2 32* 02/21/2013 0908   BUN 16 05/05/2015 0803     BUN 13 05/02/2015 1850   BUN 19.5 02/21/2013 0908   CREATININE 1.0 05/05/2015 0803   CREATININE 0.89 05/02/2015 1850   CREATININE 1.5* 02/21/2013 0908      Component Value Date/Time   CALCIUM 9.0 05/05/2015 0803  CALCIUM 8.5* 05/02/2015 1850   CALCIUM 17.7 Repeated and Verified* 02/21/2013 0908   CALCIUM >15.0* 02/01/2013 1230   ALKPHOS 63 05/05/2015 0803   ALKPHOS 66 04/14/2015 0830   AST 24 05/05/2015 0803   AST 21 04/14/2015 0830   ALT 24 05/05/2015 0803   ALT 20 04/14/2015 0830   BILITOT 0.80 05/05/2015 0803   BILITOT 1.0 04/14/2015 0830     Impression and Plan: Mr. Pezzullo is 44 year old African American male with relapsed kappa light chain myeloma. He did undergo stem cell transplant for his myeloma on September 11, 2013 at [facility].  He is now receiving velcade/darzalex. We will proceed with his Darzalex as planned but will hold the velcade today.  He has had a stomach virus since the weekend and has had some diarrhea, cough and low grade fever. He states that he feels dehydrated. We will give him fluids. He is afebrile at this time.  His chest xray this morning was negative.  His platelet count is 26 today. He has had no episodes of bleeding or bruising. We will hold off ongiving him platelets for now.  We will have him try Klonopin 0.5 mg q8h PRN for anxiety.  He has his current appointment and treatment schedule.  He knows to call with any questions or concerns. We can always see him sooner if need be.   [name], NP 10/11/20169:08 AM

## 2015-05-12 NOTE — Patient Instructions (Signed)
Cancer Center Discharge Instructions for Patients Receiving Chemotherapy  Today you received the following chemotherapy agents Darzalex  To help prevent nausea and vomiting after your treatment, we encourage you to take your nausea medication    If you develop nausea and vomiting that is not controlled by your nausea medication, call the clinic.   BELOW ARE SYMPTOMS THAT SHOULD BE REPORTED IMMEDIATELY:  *FEVER GREATER THAN 100.5 F  *CHILLS WITH OR WITHOUT FEVER  NAUSEA AND VOMITING THAT IS NOT CONTROLLED WITH YOUR NAUSEA MEDICATION  *UNUSUAL SHORTNESS OF BREATH  *UNUSUAL BRUISING OR BLEEDING  TENDERNESS IN MOUTH AND THROAT WITH OR WITHOUT PRESENCE OF ULCERS  *URINARY PROBLEMS  *BOWEL PROBLEMS  UNUSUAL RASH Items with * indicate a potential emergency and should be followed up as soon as possible.  Feel free to call the clinic you have any questions or concerns. The clinic phone number is [phone].  Please show the CHEMO ALERT CARD at check-in to the Emergency Department and triage nurse.   

## 2015-05-13 ENCOUNTER — Ambulatory Visit: Payer: BLUE CROSS/BLUE SHIELD

## 2015-05-13 MED ORDER — CLONAZEPAM 0.5 MG PO TABS: 0.5000 mg | ORAL_TABLET | Freq: Three times a day (TID) | ORAL | Status: DC | PRN

## 2015-05-15 ENCOUNTER — Ambulatory Visit: Payer: BLUE CROSS/BLUE SHIELD

## 2015-05-18 ENCOUNTER — Ambulatory Visit: Payer: BLUE CROSS/BLUE SHIELD

## 2015-05-19 ENCOUNTER — Ambulatory Visit (HOSPITAL_BASED_OUTPATIENT_CLINIC_OR_DEPARTMENT_OTHER): Payer: BLUE CROSS/BLUE SHIELD

## 2015-05-19 ENCOUNTER — Other Ambulatory Visit (HOSPITAL_BASED_OUTPATIENT_CLINIC_OR_DEPARTMENT_OTHER): Payer: BLUE CROSS/BLUE SHIELD

## 2015-05-19 ENCOUNTER — Ambulatory Visit (HOSPITAL_BASED_OUTPATIENT_CLINIC_OR_DEPARTMENT_OTHER): Payer: BLUE CROSS/BLUE SHIELD | Admitting: Family

## 2015-05-19 ENCOUNTER — Encounter: Payer: Self-pay | Admitting: Family

## 2015-05-19 VITALS — BP 127/87 | HR 105 | Temp 98.4°F | Resp 18 | Ht 71.0 in

## 2015-05-19 VITALS — BP 121/75 | HR 93 | Temp 97.9°F | Resp 18

## 2015-05-19 DIAGNOSIS — C9000 Multiple myeloma not having achieved remission: Secondary | ICD-10-CM | POA: Diagnosis not present

## 2015-05-19 DIAGNOSIS — M549 Dorsalgia, unspecified: Secondary | ICD-10-CM

## 2015-05-19 DIAGNOSIS — C9002 Multiple myeloma in relapse: Secondary | ICD-10-CM | POA: Diagnosis not present

## 2015-05-19 DIAGNOSIS — Z5112 Encounter for antineoplastic immunotherapy: Secondary | ICD-10-CM | POA: Diagnosis not present

## 2015-05-19 LAB — CBC WITH DIFFERENTIAL (CANCER CENTER ONLY)
BASO#: 0 10*3/uL (ref 0.0–0.2)
BASO%: 0.3 % (ref 0.0–2.0)
EOS ABS: 0 10*3/uL (ref 0.0–0.5)
EOS%: 0.8 % (ref 0.0–7.0)
HEMATOCRIT: 26.4 % — AB (ref 38.7–49.9)
HEMOGLOBIN: 8.8 g/dL — AB (ref 13.0–17.1)
LYMPH#: 0.6 10*3/uL — ABNORMAL LOW (ref 0.9–3.3)
LYMPH%: 15.5 % (ref 14.0–48.0)
MCH: 30.9 pg (ref 28.0–33.4)
MCHC: 33.3 g/dL (ref 32.0–35.9)
MCV: 93 fL (ref 82–98)
MONO#: 0.6 10*3/uL (ref 0.1–0.9)
MONO%: 15 % — ABNORMAL HIGH (ref 0.0–13.0)
NEUT#: 2.6 10*3/uL (ref 1.5–6.5)
NEUT%: 68.4 % (ref 40.0–80.0)
PLATELETS: 39 10*3/uL — AB (ref 145–400)
RBC: 2.85 10*6/uL — AB (ref 4.20–5.70)
RDW: 19.8 % — ABNORMAL HIGH (ref 11.1–15.7)
WBC: 3.9 10*3/uL — ABNORMAL LOW (ref 4.0–10.0)

## 2015-05-19 LAB — CMP (CANCER CENTER ONLY)
ALBUMIN: 3.7 g/dL (ref 3.3–5.5)
ALK PHOS: 52 U/L (ref 26–84)
ALT: 28 U/L (ref 10–47)
AST: 25 U/L (ref 11–38)
BILIRUBIN TOTAL: 0.8 mg/dL (ref 0.20–1.60)
BUN, Bld: 12 mg/dL (ref 7–22)
CALCIUM: 9.8 mg/dL (ref 8.0–10.3)
CO2: 27 mEq/L (ref 18–33)
Chloride: 104 mEq/L (ref 98–108)
Creat: 1.1 mg/dl (ref 0.6–1.2)
GLUCOSE: 102 mg/dL (ref 73–118)
Potassium: 3.5 mEq/L (ref 3.3–4.7)
Sodium: 141 mEq/L (ref 128–145)
TOTAL PROTEIN: 6.8 g/dL (ref 6.4–8.1)

## 2015-05-19 MED ORDER — DIPHENHYDRAMINE HCL 25 MG PO CAPS
50.0000 mg | ORAL_CAPSULE | Freq: Once | ORAL | Status: AC
  Administered 2015-05-19: 50 mg via ORAL

## 2015-05-19 MED ORDER — HYDROMORPHONE HCL 4 MG/ML IJ SOLN
INTRAMUSCULAR | Status: AC
  Filled 2015-05-19: qty 1

## 2015-05-19 MED ORDER — BORTEZOMIB CHEMO SQ INJECTION 3.5 MG (2.5MG/ML)
1.3000 mg/m2 | Freq: Once | INTRAMUSCULAR | Status: AC
  Administered 2015-05-19: 2.75 mg via SUBCUTANEOUS
  Filled 2015-05-19: qty 2.75

## 2015-05-19 MED ORDER — ONDANSETRON HCL 40 MG/20ML IJ SOLN
Freq: Once | INTRAMUSCULAR | Status: AC
  Administered 2015-05-19: 10:00:00 via INTRAVENOUS
  Filled 2015-05-19: qty 4

## 2015-05-19 MED ORDER — ACETAMINOPHEN 325 MG PO TABS
ORAL_TABLET | ORAL | Status: AC
  Filled 2015-05-19: qty 2

## 2015-05-19 MED ORDER — HEPARIN SOD (PORK) LOCK FLUSH 100 UNIT/ML IV SOLN
500.0000 [IU] | Freq: Once | INTRAVENOUS | Status: AC | PRN
  Administered 2015-05-19: 500 [IU]
  Filled 2015-05-19: qty 5

## 2015-05-19 MED ORDER — ACETAMINOPHEN 325 MG PO TABS
650.0000 mg | ORAL_TABLET | Freq: Once | ORAL | Status: AC
  Administered 2015-05-19: 650 mg via ORAL

## 2015-05-19 MED ORDER — ONDANSETRON HCL 8 MG PO TABS: 8.0000 mg | ORAL_TABLET | Freq: Once | ORAL | Status: DC

## 2015-05-19 MED ORDER — SODIUM CHLORIDE 0.9 % IJ SOLN
10.0000 mL | INTRAMUSCULAR | Status: DC | PRN
  Administered 2015-05-19: 10 mL
  Filled 2015-05-19: qty 10

## 2015-05-19 MED ORDER — HYDROMORPHONE HCL 4 MG/ML IJ SOLN
4.0000 mg | Freq: Once | INTRAMUSCULAR | Status: AC
  Administered 2015-05-19: 4 mg via INTRAVENOUS

## 2015-05-19 MED ORDER — DIPHENHYDRAMINE HCL 25 MG PO CAPS
ORAL_CAPSULE | ORAL | Status: AC
Start: 2015-05-19 — End: 2015-05-19
  Filled 2015-05-19: qty 2

## 2015-05-19 MED ORDER — SODIUM CHLORIDE 0.9 % IV SOLN
Freq: Once | INTRAVENOUS | Status: AC
  Administered 2015-05-19: 09:00:00 via INTRAVENOUS

## 2015-05-19 MED ORDER — METHYLPREDNISOLONE SODIUM SUCC 125 MG IJ SOLR
125.0000 mg | Freq: Once | INTRAMUSCULAR | Status: AC
  Administered 2015-05-19: 125 mg via INTRAVENOUS

## 2015-05-19 MED ORDER — METHYLPREDNISOLONE SODIUM SUCC 125 MG IJ SOLR
INTRAMUSCULAR | Status: AC
  Filled 2015-05-19: qty 2

## 2015-05-19 MED ORDER — DARATUMUMAB CHEMO INJECTION 400 MG/20ML
15.8000 mg/kg | Freq: Once | INTRAVENOUS | Status: AC
  Administered 2015-05-19: 1500 mg via INTRAVENOUS
  Filled 2015-05-19: qty 60

## 2015-05-19 MED ORDER — ONDANSETRON HCL 8 MG PO TABS
ORAL_TABLET | ORAL | Status: AC
Start: 2015-05-19 — End: 2015-05-19
  Filled 2015-05-19: qty 1

## 2015-05-19 NOTE — Patient Instructions (Signed)
[facility] Discharge Instructions for Patients Receiving Chemotherapy  Today you received the following chemotherapy agents Velcade and Darzalex.   To help prevent nausea and vomiting after your treatment, we encourage you to take your nausea medication.   If you develop nausea and vomiting that is not controlled by your nausea medication, call the clinic.   BELOW ARE SYMPTOMS THAT SHOULD BE REPORTED IMMEDIATELY:  *FEVER GREATER THAN 100.5 F  *CHILLS WITH OR WITHOUT FEVER  NAUSEA AND VOMITING THAT IS NOT CONTROLLED WITH YOUR NAUSEA MEDICATION  *UNUSUAL SHORTNESS OF BREATH  *UNUSUAL BRUISING OR BLEEDING  TENDERNESS IN MOUTH AND THROAT WITH OR WITHOUT PRESENCE OF ULCERS  *URINARY PROBLEMS  *BOWEL PROBLEMS  UNUSUAL RASH Items with * indicate a potential emergency and should be followed up as soon as possible.  Feel free to call the clinic you have any questions or concerns. The clinic phone number is [phone].  Please show the [place] at check-in to the Emergency Department and triage nurse.

## 2015-05-19 NOTE — Progress Notes (Signed)
Hematology and Oncology Follow Up Visit  Mark [name] [id number] [birth year] 44 y.o. 05/19/2015   Principle Diagnosis:  Relapsed kappa light chain myeloma - July 2016  - transplant at [facility] in February 2015   Current Therapy:   Velcade/Daratumumab s/p cycle 1 Zometa 4 mg IV every month    Interim History: Mark [name] is here today for follow-up and day 1 of cycle 2. He is still having some fatigue. He has had nausea with an episode of vomiting this week. He has also had a fever as high as 102 at times. He was able to treat this effectively with fluids and Tylenol. His appetite is ok despite not feeling well. He has been drinking fluids.  He is afebrile at this time and has no c/o nausea.  He is still having pain in his back pain and joint pain in his legs and feet. No swelling in his extremities. Neuropathy in his feet is unchanged. No new aches or pains at this time.  He has not filled his Klonopin prescription yet and is still having episodes of anxiety. He is having this filled today.  He denies chills, cough, rash, dizziness, chest pain, palpitations, abdominal pain or changes in his bowel or bladder habits. He has SOB with exertion that is unchanged from his baseline.  He has had no episodes of bleeding or bruising.   Medications:    Medication List       This list is accurate as of: 05/19/15  8:50 AM.  Always use your most recent med list.               bisacodyl 5 MG EC tablet  Commonly known as:  DULCOLAX  Take 5 mg by mouth daily as needed for moderate constipation (constipation).     clonazePAM 0.5 MG tablet  Commonly known as:  KLONOPIN  Take 1 tablet (0.5 mg total) by mouth 3 (three) times daily as needed for anxiety.     famciclovir 500 MG tablet  Commonly known as:  FAMVIR  Take 1 tablet (500 mg total) by mouth daily.     fentaNYL 75 MCG/HR  Commonly known as:  DURAGESIC - dosed mcg/hr  Place 1 patch (75 mcg total) onto the skin every 3 (three) days.     fluconazole 100 MG tablet  Commonly known as:  DIFLUCAN  Take 1 tablet (100 mg total) by mouth daily.     gabapentin 300 MG capsule  Commonly known as:  NEURONTIN  Take 2 capsules (600 mg total) by mouth 3 (three) times daily.     HYDROmorphone 4 MG tablet  Commonly known as:  DILAUDID  Take 1-2, IF NEEDED, for pain every 4 hrs for pain     Ipratropium-Albuterol 20-100 MCG/ACT Aers respimat  Commonly known as:  COMBIVENT  Inhale 1 puff into the lungs every 6 (six) hours.     lidocaine-prilocaine cream  Commonly known as:  EMLA  Apply to affected area once     LORazepam 0.5 MG tablet  Commonly known as:  ATIVAN  Take 1 tablet (0.5 mg total) by mouth every 6 (six) hours as needed (Nausea or vomiting).     metoCLOPramide 10 MG tablet  Commonly known as:  REGLAN  Take 1 tablet (10 mg total) by mouth every 6 (six) hours as needed for nausea or vomiting.     montelukast 10 MG tablet  Commonly known as:  SINGULAIR  Take 10 mg by mouth at bedtime.  ondansetron 8 MG disintegrating tablet  Commonly known as:  ZOFRAN ODT  Take 1 tablet (8 mg total) by mouth every 8 (eight) hours as needed for nausea or vomiting.     pantoprazole 40 MG tablet  Commonly known as:  PROTONIX  Take 1 tablet (40 mg total) by mouth daily.     PRESCRIPTION MEDICATION  Chemo at [facility].     prochlorperazine 10 MG tablet  Commonly known as:  COMPAZINE  Take 1 tablet (10 mg total) by mouth every 6 (six) hours as needed (Nausea or vomiting).     saccharomyces boulardii 250 MG capsule  Commonly known as:  FLORASTOR  Take 1 capsule (250 mg total) by mouth 2 (two) times daily.     temazepam 22.5 MG capsule  Commonly known as:  RESTORIL  Take 1 capsule (22.5 mg total) by mouth at bedtime as needed for sleep.        Allergies:  Allergies  Allergen Reactions  . Gadolinium Derivatives Nausea And Vomiting    Pt became very nauseous and got sick.     Past Medical History, Surgical history, Social  history, and Family History were reviewed and updated.  Review of Systems: All other 10 point review of systems is negative.   Physical Exam:  height is 5\' 11"  (1.803 m). His oral temperature is 98.4 F (36.9 C). His blood pressure is 127/87 and his pulse is 105. His respiration is 18.   Wt Readings from Last 3 Encounters:  05/12/15 197 lb (89.359 kg)  04/17/15 209 lb (94.802 kg)  04/12/15 204 lb (92.534 kg)   Ocular: Sclerae unicteric, pupils equal, round and reactive to light Ear-nose-throat: Oropharynx clear, dentition fair Lymphatic: No cervical or supraclavicular adenopathy Lungs no rales or rhonchi, good excursion bilaterally Heart regular rate and rhythm, no murmur appreciated Abd soft, nontender, positive bowel sounds MSK Has chronic lower back pain and tenderness as well as joint pain, no joint edema Neuro: non-focal, well-oriented, appropriate affect  Lab Results  Component Value Date   WBC 3.9* 05/19/2015   HGB 8.8* 05/19/2015   HCT 26.4* 05/19/2015   MCV 93 05/19/2015   PLT 39* 05/19/2015   No results found for: FERRITIN, IRON, TIBC, UIBC, IRONPCTSAT Lab Results  Component Value Date   RBC 2.85* 05/19/2015   Lab Results  Component Value Date   KPAFRELGTCHN 534.75* 04/11/2015   LAMBDASER 5.54* 04/11/2015   KAPLAMBRATIO 6272.73* 04/11/2015   Lab Results  Component Value Date   IGGSERUM 480* 04/07/2015   IGA 12* 04/07/2015   IGMSERUM 5* 04/07/2015   Lab Results  Component Value Date   TOTALPROTELP 6.6 04/07/2015   ALBUMINELP 4.4 04/07/2015   A1GS 0.3 04/07/2015   A2GS 0.7 04/07/2015   BETS 0.4 04/07/2015   BETA2SER 0.3 04/07/2015   GAMS 0.5* 04/07/2015   MSPIKE 0.23 09/22/2014   SPEI * 04/07/2015     Chemistry      Component Value Date/Time   NA 134 05/12/2015 0828   NA 143 05/02/2015 1850   NA 145 02/21/2013 0908   K 3.2* 05/12/2015 0828   K 3.6 05/02/2015 1850   K 3.8 02/21/2013 0908   CL 103 05/12/2015 0828   CL 110 05/02/2015 1850    CO2 24 05/12/2015 0828   CO2 27 05/02/2015 1850   CO2 32* 02/21/2013 0908   BUN 12 05/12/2015 0828   BUN 13 05/02/2015 1850   BUN 19.5 02/21/2013 0908   CREATININE 1.2 05/12/2015 1194  CREATININE 0.89 05/02/2015 1850   CREATININE 1.5* 02/21/2013 0908      Component Value Date/Time   CALCIUM 8.4 05/12/2015 0828   CALCIUM 8.5* 05/02/2015 1850   CALCIUM 17.7 Repeated and Verified* 02/21/2013 0908   CALCIUM >15.0* 02/01/2013 1230   ALKPHOS 65 05/12/2015 0828   ALKPHOS 66 04/14/2015 0830   AST 26 05/12/2015 0828   AST 21 04/14/2015 0830   ALT 24 05/12/2015 0828   ALT 20 04/14/2015 0830   BILITOT 0.90 05/12/2015 0828   BILITOT 1.0 04/14/2015 0830     Impression and Plan: Mr. Lennox is 44 year old African American male with relapsed kappa light chain myeloma. He did undergo stem cell transplant for his myeloma on September 11, 2013 at [facility].  He is now receiving velcade/darzalex.  We will proceed with cycle 2 today as planned. His myeloma studies drawn today are pending.  His platelet count is holding at 39 with a Hgb of 8.8 and MCV of 93. No episodes of bleeding or bruising.  He will notify us if his fever exceeds 100.5.  He has his current appointment and treatment schedule.  He knows to call with any questions or concerns. We can always see him sooner if need be.   [name], NP 10/18/20168:50 AM

## 2015-05-21 LAB — IGG, IGA, IGM
IGA: 7 mg/dL — AB (ref 68–379)
IgG (Immunoglobin G), Serum: 351 mg/dL — ABNORMAL LOW (ref 650–1600)

## 2015-05-21 LAB — PROTEIN ELECTROPHORESIS, SERUM
ABNORMAL PROTEIN BAND1: 0.1 g/dL
ALBUMIN ELP: 3.8 g/dL (ref 3.8–4.8)
Alpha-1-Globulin: 0.5 g/dL — ABNORMAL HIGH (ref 0.2–0.3)
Alpha-2-Globulin: 1 g/dL — ABNORMAL HIGH (ref 0.5–0.9)
BETA 2: 0.4 g/dL (ref 0.2–0.5)
Beta Globulin: 0.3 g/dL — ABNORMAL LOW (ref 0.4–0.6)
GAMMA GLOBULIN: 0.4 g/dL — AB (ref 0.8–1.7)
TOTAL PROTEIN, SERUM ELECTROPHOR: 6.3 g/dL (ref 6.1–8.1)

## 2015-05-21 LAB — KAPPA/LAMBDA LIGHT CHAINS
Kappa free light chain: 55.6 mg/dL — ABNORMAL HIGH (ref 0.33–1.94)
LAMBDA FREE LGHT CHN: 0.03 mg/dL — AB (ref 0.57–2.63)

## 2015-05-21 LAB — LACTATE DEHYDROGENASE: LDH: 211 U/L (ref 94–250)

## 2015-05-22 ENCOUNTER — Ambulatory Visit (HOSPITAL_BASED_OUTPATIENT_CLINIC_OR_DEPARTMENT_OTHER): Payer: BLUE CROSS/BLUE SHIELD

## 2015-05-22 ENCOUNTER — Telehealth: Payer: Self-pay | Admitting: Family

## 2015-05-22 VITALS — BP 116/70 | HR 98 | Temp 98.8°F | Resp 16

## 2015-05-22 DIAGNOSIS — Z5112 Encounter for antineoplastic immunotherapy: Secondary | ICD-10-CM

## 2015-05-22 DIAGNOSIS — M25551 Pain in right hip: Secondary | ICD-10-CM

## 2015-05-22 DIAGNOSIS — C9002 Multiple myeloma in relapse: Secondary | ICD-10-CM

## 2015-05-22 DIAGNOSIS — C9000 Multiple myeloma not having achieved remission: Secondary | ICD-10-CM

## 2015-05-22 MED ORDER — BORTEZOMIB CHEMO SQ INJECTION 3.5 MG (2.5MG/ML)
1.3000 mg/m2 | Freq: Once | INTRAMUSCULAR | Status: AC
  Administered 2015-05-22: 2.75 mg via SUBCUTANEOUS
  Filled 2015-05-22: qty 2.75

## 2015-05-22 MED ORDER — ONDANSETRON HCL 8 MG PO TABS
8.0000 mg | ORAL_TABLET | Freq: Once | ORAL | Status: AC
  Administered 2015-05-22: 8 mg via ORAL

## 2015-05-22 MED ORDER — FENTANYL 100 MCG/HR TD PT72: 75.0000 ug | MEDICATED_PATCH | TRANSDERMAL | Status: DC

## 2015-05-22 NOTE — Patient Instructions (Signed)
Bortezomib injection What is this medicine? BORTEZOMIB (bor TEZ oh mib) is a medicine that targets proteins in cancer cells and stops the cancer cells from growing. It is used to treat multiple myeloma and mantle-cell lymphoma. This medicine may be used for other purposes; ask your health care provider or pharmacist if you have questions. What should I tell my health care provider before I take this medicine? They need to know if you have any of these conditions: -diabetes -heart disease -irregular heartbeat -liver disease -on hemodialysis -low blood counts, like low white blood cells, platelets, or hemoglobin -peripheral neuropathy -taking medicine for blood pressure -an unusual or allergic reaction to bortezomib, mannitol, boron, other medicines, foods, dyes, or preservatives -pregnant or trying to get pregnant -breast-feeding How should I use this medicine? This medicine is for injection into a vein or for injection under the skin. It is given by a health care professional in a hospital or clinic setting. Talk to your pediatrician regarding the use of this medicine in children. Special care may be needed. Overdosage: If you think you have taken too much of this medicine contact a poison control center or emergency room at once. NOTE: This medicine is only for you. Do not share this medicine with others. What if I miss a dose? It is important not to miss your dose. Call your doctor or health care professional if you are unable to keep an appointment. What may interact with this medicine? This medicine may interact with the following medications: -ketoconazole -rifampin -ritonavir -St. John's Wort This list may not describe all possible interactions. Give your health care provider a list of all the medicines, herbs, non-prescription drugs, or dietary supplements you use. Also tell them if you smoke, drink alcohol, or use illegal drugs. Some items may interact with your medicine. What  should I watch for while using this medicine? Visit your doctor for checks on your progress. This drug may make you feel generally unwell. This is not uncommon, as chemotherapy can affect healthy cells as well as cancer cells. Report any side effects. Continue your course of treatment even though you feel ill unless your doctor tells you to stop. You may get drowsy or dizzy. Do not drive, use machinery, or do anything that needs mental alertness until you know how this medicine affects you. Do not stand or sit up quickly, especially if you are an older patient. This reduces the risk of dizzy or fainting spells. In some cases, you may be given additional medicines to help with side effects. Follow all directions for their use. Call your doctor or health care professional for advice if you get a fever, chills or sore throat, or other symptoms of a cold or flu. Do not treat yourself. This drug decreases your body's ability to fight infections. Try to avoid being around people who are sick. This medicine may increase your risk to bruise or bleed. Call your doctor or health care professional if you notice any unusual bleeding. You may need blood work done while you are taking this medicine. In some patients, this medicine may cause a serious brain infection that may cause death. If you have any problems seeing, thinking, speaking, walking, or standing, tell your doctor right away. If you cannot reach your doctor, urgently seek other source of medical care. Do not become pregnant while taking this medicine. Women should inform their doctor if they wish to become pregnant or think they might be pregnant. There is a potential for serious  side effects to an unborn child. Talk to your health care professional or pharmacist for more information. Do not breast-feed an infant while taking this medicine. Check with your doctor or health care professional if you get an attack of severe diarrhea, nausea and vomiting, or if  you sweat a lot. The loss of too much body fluid can make it dangerous for you to take this medicine. What side effects may I notice from receiving this medicine? Side effects that you should report to your doctor or health care professional as soon as possible: -allergic reactions like skin rash, itching or hives, swelling of the face, lips, or tongue -breathing problems -changes in hearing -changes in vision -fast, irregular heartbeat -feeling faint or lightheaded, falls -pain, tingling, numbness in the hands or feet -right upper belly pain -seizures -swelling of the ankles, feet, hands -unusual bleeding or bruising -unusually weak or tired -vomiting -yellowing of the eyes or skin Side effects that usually do not require medical attention (report to your doctor or health care professional if they continue or are bothersome): -changes in emotions or moods -constipation -diarrhea -loss of appetite -headache -irritation at site where injected -nausea This list may not describe all possible side effects. Call your doctor for medical advice about side effects. You may report side effects to FDA at 1-800-FDA-1088. Where should I keep my medicine? This drug is given in a hospital or clinic and will not be stored at home. NOTE: This sheet is a summary. It may not cover all possible information. If you have questions about this medicine, talk to your doctor, pharmacist, or health care provider.    2016, Elsevier/Gold Standard. (2014-09-16 14:47:04)

## 2015-05-22 NOTE — Telephone Encounter (Signed)
I spoke with Mark [name] and went over his Myeloma lab work with him. All questions were answered and we will see him later today for Velcade.

## 2015-05-25 ENCOUNTER — Other Ambulatory Visit: Payer: Self-pay | Admitting: Emergency Medicine

## 2015-05-25 DIAGNOSIS — C9002 Multiple myeloma in relapse: Secondary | ICD-10-CM

## 2015-05-26 ENCOUNTER — Other Ambulatory Visit (HOSPITAL_BASED_OUTPATIENT_CLINIC_OR_DEPARTMENT_OTHER): Payer: BLUE CROSS/BLUE SHIELD

## 2015-05-26 ENCOUNTER — Ambulatory Visit (HOSPITAL_BASED_OUTPATIENT_CLINIC_OR_DEPARTMENT_OTHER): Payer: BLUE CROSS/BLUE SHIELD

## 2015-05-26 ENCOUNTER — Other Ambulatory Visit: Payer: Self-pay | Admitting: Family

## 2015-05-26 VITALS — BP 114/70 | HR 97 | Temp 98.2°F | Resp 118

## 2015-05-26 DIAGNOSIS — Z5112 Encounter for antineoplastic immunotherapy: Secondary | ICD-10-CM

## 2015-05-26 DIAGNOSIS — C9000 Multiple myeloma not having achieved remission: Secondary | ICD-10-CM

## 2015-05-26 DIAGNOSIS — C9002 Multiple myeloma in relapse: Secondary | ICD-10-CM | POA: Diagnosis not present

## 2015-05-26 DIAGNOSIS — D501 Sideropenic dysphagia: Secondary | ICD-10-CM

## 2015-05-26 LAB — CBC WITH DIFFERENTIAL (CANCER CENTER ONLY)
BASO#: 0 10*3/uL (ref 0.0–0.2)
BASO%: 0.3 % (ref 0.0–2.0)
EOS ABS: 0 10*3/uL (ref 0.0–0.5)
EOS%: 1 % (ref 0.0–7.0)
HEMATOCRIT: 23.8 % — AB (ref 38.7–49.9)
HGB: 7.8 g/dL — ABNORMAL LOW (ref 13.0–17.1)
LYMPH#: 0.7 10*3/uL — ABNORMAL LOW (ref 0.9–3.3)
LYMPH%: 17.3 % (ref 14.0–48.0)
MCH: 31 pg (ref 28.0–33.4)
MCHC: 32.8 g/dL (ref 32.0–35.9)
MCV: 94 fL (ref 82–98)
MONO#: 0.5 10*3/uL (ref 0.1–0.9)
MONO%: 12.1 % (ref 0.0–13.0)
NEUT#: 2.7 10*3/uL (ref 1.5–6.5)
NEUT%: 69.3 % (ref 40.0–80.0)
PLATELETS: 14 10*3/uL — AB (ref 145–400)
RBC: 2.52 10*6/uL — AB (ref 4.20–5.70)
RDW: 20 % — ABNORMAL HIGH (ref 11.1–15.7)
WBC: 3.9 10*3/uL — AB (ref 4.0–10.0)

## 2015-05-26 LAB — CMP (CANCER CENTER ONLY)
ALBUMIN: 3.5 g/dL (ref 3.3–5.5)
ALK PHOS: 48 U/L (ref 26–84)
ALT: 25 U/L (ref 10–47)
AST: 30 U/L (ref 11–38)
BUN: 8 mg/dL (ref 7–22)
CALCIUM: 9.6 mg/dL (ref 8.0–10.3)
CHLORIDE: 103 meq/L (ref 98–108)
CO2: 28 mEq/L (ref 18–33)
Creat: 0.8 mg/dl (ref 0.6–1.2)
Glucose, Bld: 166 mg/dL — ABNORMAL HIGH (ref 73–118)
POTASSIUM: 3.3 meq/L (ref 3.3–4.7)
Sodium: 141 mEq/L (ref 128–145)
TOTAL PROTEIN: 5.9 g/dL — AB (ref 6.4–8.1)
Total Bilirubin: 1 mg/dl (ref 0.20–1.60)

## 2015-05-26 LAB — TECHNOLOGIST REVIEW CHCC SATELLITE

## 2015-05-26 MED ORDER — ONDANSETRON HCL 8 MG PO TABS
8.0000 mg | ORAL_TABLET | Freq: Once | ORAL | Status: DC
Start: 2015-05-26 — End: 2015-05-26

## 2015-05-26 MED ORDER — SODIUM CHLORIDE 0.9 % IV SOLN
Freq: Once | INTRAVENOUS | Status: AC
  Administered 2015-05-26: 10:00:00 via INTRAVENOUS
  Filled 2015-05-26: qty 4

## 2015-05-26 MED ORDER — METHYLPREDNISOLONE SODIUM SUCC 125 MG IJ SOLR
INTRAMUSCULAR | Status: AC
  Filled 2015-05-26: qty 2

## 2015-05-26 MED ORDER — HYDROMORPHONE HCL 1 MG/ML IJ SOLN
1.0000 mg | Freq: Once | INTRAMUSCULAR | Status: AC
  Administered 2015-05-26: 1 mg via INTRAVENOUS

## 2015-05-26 MED ORDER — METHYLPREDNISOLONE SODIUM SUCC 125 MG IJ SOLR
125.0000 mg | Freq: Once | INTRAMUSCULAR | Status: AC
  Administered 2015-05-26: 125 mg via INTRAVENOUS

## 2015-05-26 MED ORDER — HEPARIN SOD (PORK) LOCK FLUSH 100 UNIT/ML IV SOLN
500.0000 [IU] | Freq: Once | INTRAVENOUS | Status: AC | PRN
  Administered 2015-05-26: 500 [IU]
  Filled 2015-05-26: qty 5

## 2015-05-26 MED ORDER — BORTEZOMIB CHEMO SQ INJECTION 3.5 MG (2.5MG/ML)
1.3000 mg/m2 | Freq: Once | INTRAMUSCULAR | Status: AC
  Administered 2015-05-26: 2.75 mg via SUBCUTANEOUS
  Filled 2015-05-26: qty 2.75

## 2015-05-26 MED ORDER — SODIUM CHLORIDE 0.9 % IV SOLN
15.9000 mg/kg | Freq: Once | INTRAVENOUS | Status: AC
  Administered 2015-05-26: 1500 mg via INTRAVENOUS
  Filled 2015-05-26: qty 60

## 2015-05-26 MED ORDER — ACETAMINOPHEN 325 MG PO TABS
650.0000 mg | ORAL_TABLET | Freq: Once | ORAL | Status: AC
  Administered 2015-05-26: 650 mg via ORAL

## 2015-05-26 MED ORDER — DIPHENHYDRAMINE HCL 25 MG PO CAPS
50.0000 mg | ORAL_CAPSULE | Freq: Once | ORAL | Status: AC
  Administered 2015-05-26: 50 mg via ORAL

## 2015-05-26 MED ORDER — DIPHENHYDRAMINE HCL 25 MG PO CAPS
ORAL_CAPSULE | ORAL | Status: AC
  Filled 2015-05-26: qty 2

## 2015-05-26 MED ORDER — HYDROMORPHONE HCL 1 MG/ML IJ SOLN
INTRAMUSCULAR | Status: AC
  Filled 2015-05-26: qty 1

## 2015-05-26 MED ORDER — SODIUM CHLORIDE 0.9 % IV SOLN
Freq: Once | INTRAVENOUS | Status: AC
  Administered 2015-05-26: 08:00:00 via INTRAVENOUS

## 2015-05-26 MED ORDER — SODIUM CHLORIDE 0.9 % IJ SOLN
10.0000 mL | INTRAMUSCULAR | Status: DC | PRN
  Administered 2015-05-26: 10 mL
  Filled 2015-05-26: qty 10

## 2015-05-26 MED ORDER — ACETAMINOPHEN 325 MG PO TABS
ORAL_TABLET | ORAL | Status: AC
  Filled 2015-05-26: qty 2

## 2015-05-26 NOTE — Patient Instructions (Signed)
[facility] Discharge Instructions for Patients Receiving Chemotherapy  Today you received the following chemotherapy agents Velcade and Darzalex.   To help prevent nausea and vomiting after your treatment, we encourage you to take your nausea medication.   If you develop nausea and vomiting that is not controlled by your nausea medication, call the clinic.   BELOW ARE SYMPTOMS THAT SHOULD BE REPORTED IMMEDIATELY:  *FEVER GREATER THAN 100.5 F  *CHILLS WITH OR WITHOUT FEVER  NAUSEA AND VOMITING THAT IS NOT CONTROLLED WITH YOUR NAUSEA MEDICATION  *UNUSUAL SHORTNESS OF BREATH  *UNUSUAL BRUISING OR BLEEDING  TENDERNESS IN MOUTH AND THROAT WITH OR WITHOUT PRESENCE OF ULCERS  *URINARY PROBLEMS  *BOWEL PROBLEMS  UNUSUAL RASH Items with * indicate a potential emergency and should be followed up as soon as possible.  Feel free to call the clinic you have any questions or concerns. The clinic phone number is [phone].  Please show the [facility] at check-in to the Emergency Department and triage nurse.

## 2015-05-26 NOTE — Progress Notes (Signed)
Per Dr Alen Blew ok to treat.

## 2015-05-29 ENCOUNTER — Other Ambulatory Visit: Payer: Self-pay | Admitting: Family

## 2015-05-29 ENCOUNTER — Ambulatory Visit (HOSPITAL_COMMUNITY)
Admission: RE | Admit: 2015-05-29 | Discharge: 2015-05-29 | Disposition: A | Discharge status: Home / Self Care | Payer: BLUE CROSS/BLUE SHIELD | Source: Ambulatory Visit | Attending: Hematology & Oncology | Admitting: Hematology & Oncology

## 2015-05-29 ENCOUNTER — Other Ambulatory Visit (HOSPITAL_BASED_OUTPATIENT_CLINIC_OR_DEPARTMENT_OTHER): Payer: BLUE CROSS/BLUE SHIELD

## 2015-05-29 ENCOUNTER — Telehealth: Payer: Self-pay | Admitting: *Deleted

## 2015-05-29 ENCOUNTER — Ambulatory Visit (HOSPITAL_BASED_OUTPATIENT_CLINIC_OR_DEPARTMENT_OTHER): Payer: BLUE CROSS/BLUE SHIELD

## 2015-05-29 VITALS — BP 127/74 | HR 97 | Temp 98.4°F | Resp 18

## 2015-05-29 DIAGNOSIS — C9002 Multiple myeloma in relapse: Secondary | ICD-10-CM

## 2015-05-29 DIAGNOSIS — Z5112 Encounter for antineoplastic immunotherapy: Secondary | ICD-10-CM | POA: Diagnosis not present

## 2015-05-29 DIAGNOSIS — D501 Sideropenic dysphagia: Secondary | ICD-10-CM

## 2015-05-29 DIAGNOSIS — M549 Dorsalgia, unspecified: Secondary | ICD-10-CM

## 2015-05-29 DIAGNOSIS — D696 Thrombocytopenia, unspecified: Secondary | ICD-10-CM

## 2015-05-29 DIAGNOSIS — C9000 Multiple myeloma not having achieved remission: Secondary | ICD-10-CM

## 2015-05-29 LAB — COMPREHENSIVE METABOLIC PANEL (CC13)
ALBUMIN: 3.6 g/dL (ref 3.5–5.0)
ALT: 34 U/L (ref 0–55)
AST: 25 U/L (ref 5–34)
Alkaline Phosphatase: 59 U/L (ref 40–150)
Anion Gap: 8 mEq/L (ref 3–11)
BILIRUBIN TOTAL: 0.98 mg/dL (ref 0.20–1.20)
BUN: 11.6 mg/dL (ref 7.0–26.0)
CALCIUM: 10.4 mg/dL (ref 8.4–10.4)
CO2: 32 meq/L — AB (ref 22–29)
CREATININE: 1 mg/dL (ref 0.7–1.3)
Chloride: 103 mEq/L (ref 98–109)
EGFR: >90 mL/min/{1.73_m2} (ref >90)
GLUCOSE: 101 mg/dL (ref 70–140)
POTASSIUM: 3.5 meq/L (ref 3.5–5.1)
Sodium: 143 mEq/L (ref 136–145)
Total Protein: 6.2 g/dL — ABNORMAL LOW (ref 6.4–8.3)

## 2015-05-29 LAB — CBC WITH DIFFERENTIAL (CANCER CENTER ONLY)
BASO#: 0 10*3/uL (ref 0.0–0.2)
BASO%: 0.2 % (ref 0.0–2.0)
EOS ABS: 0 10*3/uL (ref 0.0–0.5)
EOS%: 0.4 % (ref 0.0–7.0)
HEMATOCRIT: 23.9 % — AB (ref 38.7–49.9)
HEMOGLOBIN: 7.8 g/dL — AB (ref 13.0–17.1)
LYMPH#: 0.9 10*3/uL (ref 0.9–3.3)
LYMPH%: 18.1 % (ref 14.0–48.0)
MCH: 31.3 pg (ref 28.0–33.4)
MCHC: 32.6 g/dL (ref 32.0–35.9)
MCV: 96 fL (ref 82–98)
MONO#: 0.4 10*3/uL (ref 0.1–0.9)
MONO%: 9.4 % (ref 0.0–13.0)
NEUT%: 71.9 % (ref 40.0–80.0)
NEUTROS ABS: 3.4 10*3/uL (ref 1.5–6.5)
Platelets: 8 10*3/uL — CL (ref 145–400)
RBC: 2.49 10*6/uL — ABNORMAL LOW (ref 4.20–5.70)
RDW: 20.1 % — ABNORMAL HIGH (ref 11.1–15.7)
WBC: 4.7 10*3/uL (ref 4.0–10.0)

## 2015-05-29 LAB — HOLD TUBE, BLOOD BANK - CHCC SATELLITE

## 2015-05-29 LAB — TECHNOLOGIST REVIEW CHCC SATELLITE

## 2015-05-29 MED ORDER — BORTEZOMIB CHEMO SQ INJECTION 3.5 MG (2.5MG/ML)
1.3000 mg/m2 | Freq: Once | INTRAMUSCULAR | Status: AC
  Administered 2015-05-29: 2.75 mg via SUBCUTANEOUS
  Filled 2015-05-29: qty 2.75

## 2015-05-29 MED ORDER — ONDANSETRON HCL 8 MG PO TABS
8.0000 mg | ORAL_TABLET | Freq: Once | ORAL | Status: AC
  Administered 2015-05-29: 8 mg via ORAL

## 2015-05-29 MED ORDER — ONDANSETRON HCL 8 MG PO TABS
ORAL_TABLET | ORAL | Status: AC
  Filled 2015-05-29: qty 1

## 2015-05-29 NOTE — Patient Instructions (Signed)
Bortezomib injection What is this medicine? BORTEZOMIB (bor TEZ oh mib) is a medicine that targets proteins in cancer cells and stops the cancer cells from growing. It is used to treat multiple myeloma and mantle-cell lymphoma. This medicine may be used for other purposes; ask your health care provider or pharmacist if you have questions. What should I tell my health care provider before I take this medicine? They need to know if you have any of these conditions: -diabetes -heart disease -irregular heartbeat -liver disease -on hemodialysis -low blood counts, like low white blood cells, platelets, or hemoglobin -peripheral neuropathy -taking medicine for blood pressure -an unusual or allergic reaction to bortezomib, mannitol, boron, other medicines, foods, dyes, or preservatives -pregnant or trying to get pregnant -breast-feeding How should I use this medicine? This medicine is for injection into a vein or for injection under the skin. It is given by a health care professional in a hospital or clinic setting. Talk to your pediatrician regarding the use of this medicine in children. Special care may be needed. Overdosage: If you think you have taken too much of this medicine contact a poison control center or emergency room at once. NOTE: This medicine is only for you. Do not share this medicine with others. What if I miss a dose? It is important not to miss your dose. Call your doctor or health care professional if you are unable to keep an appointment. What may interact with this medicine? This medicine may interact with the following medications: -ketoconazole -rifampin -ritonavir -St. John's Wort This list may not describe all possible interactions. Give your health care provider a list of all the medicines, herbs, non-prescription drugs, or dietary supplements you use. Also tell them if you smoke, drink alcohol, or use illegal drugs. Some items may interact with your medicine. What  should I watch for while using this medicine? Visit your doctor for checks on your progress. This drug may make you feel generally unwell. This is not uncommon, as chemotherapy can affect healthy cells as well as cancer cells. Report any side effects. Continue your course of treatment even though you feel ill unless your doctor tells you to stop. You may get drowsy or dizzy. Do not drive, use machinery, or do anything that needs mental alertness until you know how this medicine affects you. Do not stand or sit up quickly, especially if you are an older patient. This reduces the risk of dizzy or fainting spells. In some cases, you may be given additional medicines to help with side effects. Follow all directions for their use. Call your doctor or health care professional for advice if you get a fever, chills or sore throat, or other symptoms of a cold or flu. Do not treat yourself. This drug decreases your body's ability to fight infections. Try to avoid being around people who are sick. This medicine may increase your risk to bruise or bleed. Call your doctor or health care professional if you notice any unusual bleeding. You may need blood work done while you are taking this medicine. In some patients, this medicine may cause a serious brain infection that may cause death. If you have any problems seeing, thinking, speaking, walking, or standing, tell your doctor right away. If you cannot reach your doctor, urgently seek other source of medical care. Do not become pregnant while taking this medicine. Women should inform their doctor if they wish to become pregnant or think they might be pregnant. There is a potential for serious  side effects to an unborn child. Talk to your health care professional or pharmacist for more information. Do not breast-feed an infant while taking this medicine. Check with your doctor or health care professional if you get an attack of severe diarrhea, nausea and vomiting, or if  you sweat a lot. The loss of too much body fluid can make it dangerous for you to take this medicine. What side effects may I notice from receiving this medicine? Side effects that you should report to your doctor or health care professional as soon as possible: -allergic reactions like skin rash, itching or hives, swelling of the face, lips, or tongue -breathing problems -changes in hearing -changes in vision -fast, irregular heartbeat -feeling faint or lightheaded, falls -pain, tingling, numbness in the hands or feet -right upper belly pain -seizures -swelling of the ankles, feet, hands -unusual bleeding or bruising -unusually weak or tired -vomiting -yellowing of the eyes or skin Side effects that usually do not require medical attention (report to your doctor or health care professional if they continue or are bothersome): -changes in emotions or moods -constipation -diarrhea -loss of appetite -headache -irritation at site where injected -nausea This list may not describe all possible side effects. Call your doctor for medical advice about side effects. You may report side effects to FDA at 1-800-FDA-1088. Where should I keep my medicine? This drug is given in a hospital or clinic and will not be stored at home. NOTE: This sheet is a summary. It may not cover all possible information. If you have questions about this medicine, talk to your doctor, pharmacist, or health care provider.    2016, Elsevier/Gold Standard. (2014-09-16 14:47:04)

## 2015-05-29 NOTE — Telephone Encounter (Signed)
Critical Value Platelet 8 Laverna Peace NP notified. Orders will be placed.

## 2015-05-30 ENCOUNTER — Ambulatory Visit (HOSPITAL_BASED_OUTPATIENT_CLINIC_OR_DEPARTMENT_OTHER): Payer: BLUE CROSS/BLUE SHIELD

## 2015-05-30 ENCOUNTER — Ambulatory Visit (HOSPITAL_BASED_OUTPATIENT_CLINIC_OR_DEPARTMENT_OTHER): Payer: BLUE CROSS/BLUE SHIELD | Attending: Hematology & Oncology

## 2015-06-01 ENCOUNTER — Ambulatory Visit (HOSPITAL_BASED_OUTPATIENT_CLINIC_OR_DEPARTMENT_OTHER): Payer: BLUE CROSS/BLUE SHIELD

## 2015-06-01 ENCOUNTER — Other Ambulatory Visit: Payer: Self-pay | Admitting: *Deleted

## 2015-06-01 VITALS — BP 120/66 | HR 90 | Temp 98.1°F | Resp 18

## 2015-06-01 DIAGNOSIS — D696 Thrombocytopenia, unspecified: Secondary | ICD-10-CM

## 2015-06-01 DIAGNOSIS — C9002 Multiple myeloma in relapse: Secondary | ICD-10-CM

## 2015-06-01 DIAGNOSIS — D501 Sideropenic dysphagia: Secondary | ICD-10-CM | POA: Diagnosis not present

## 2015-06-01 LAB — PREPARE RBC (CROSSMATCH)

## 2015-06-01 MED ORDER — SODIUM CHLORIDE 0.9 % IV SOLN
250.0000 mL | Freq: Once | INTRAVENOUS | Status: AC
  Administered 2015-06-01: 250 mL via INTRAVENOUS

## 2015-06-01 MED ORDER — DIPHENHYDRAMINE HCL 25 MG PO CAPS
25.0000 mg | ORAL_CAPSULE | Freq: Once | ORAL | Status: AC
  Administered 2015-06-01: 25 mg via ORAL

## 2015-06-01 MED ORDER — MORPHINE SULFATE (PF) 4 MG/ML IV SOLN
INTRAVENOUS | Status: AC
  Filled 2015-06-01: qty 2

## 2015-06-01 MED ORDER — ACETAMINOPHEN 325 MG PO TABS
ORAL_TABLET | ORAL | Status: AC
  Filled 2015-06-01: qty 2

## 2015-06-01 MED ORDER — FUROSEMIDE 10 MG/ML IJ SOLN: 20.0000 mg | Freq: Once | INTRAMUSCULAR | Status: DC

## 2015-06-01 MED ORDER — MORPHINE SULFATE 10 MG/ML IJ SOLN
8.0000 mg | INTRAMUSCULAR | Status: DC | PRN
  Administered 2015-06-01: 8 mg via INTRAVENOUS
  Filled 2015-06-01: qty 1

## 2015-06-01 MED ORDER — SODIUM CHLORIDE 0.9 % IJ SOLN
10.0000 mL | INTRAMUSCULAR | Status: AC | PRN
  Administered 2015-06-01: 10 mL
  Filled 2015-06-01: qty 10

## 2015-06-01 MED ORDER — DIPHENHYDRAMINE HCL 25 MG PO CAPS
ORAL_CAPSULE | ORAL | Status: AC
  Filled 2015-06-01: qty 2

## 2015-06-01 MED ORDER — ACETAMINOPHEN 325 MG PO TABS
650.0000 mg | ORAL_TABLET | Freq: Once | ORAL | Status: AC
  Administered 2015-06-01: 650 mg via ORAL

## 2015-06-01 MED ORDER — HEPARIN SOD (PORK) LOCK FLUSH 100 UNIT/ML IV SOLN
500.0000 [IU] | Freq: Every day | INTRAVENOUS | Status: AC | PRN
  Administered 2015-06-01: 500 [IU]
  Filled 2015-06-01: qty 5

## 2015-06-01 NOTE — Patient Instructions (Signed)

## 2015-06-02 ENCOUNTER — Other Ambulatory Visit: Payer: Self-pay | Admitting: Family

## 2015-06-02 ENCOUNTER — Other Ambulatory Visit (HOSPITAL_BASED_OUTPATIENT_CLINIC_OR_DEPARTMENT_OTHER): Payer: BLUE CROSS/BLUE SHIELD

## 2015-06-02 ENCOUNTER — Ambulatory Visit (HOSPITAL_BASED_OUTPATIENT_CLINIC_OR_DEPARTMENT_OTHER): Payer: BLUE CROSS/BLUE SHIELD

## 2015-06-02 VITALS — BP 125/70 | HR 54 | Temp 98.2°F | Resp 18

## 2015-06-02 DIAGNOSIS — C9002 Multiple myeloma in relapse: Secondary | ICD-10-CM | POA: Diagnosis not present

## 2015-06-02 DIAGNOSIS — Z5112 Encounter for antineoplastic immunotherapy: Secondary | ICD-10-CM | POA: Diagnosis not present

## 2015-06-02 DIAGNOSIS — C9000 Multiple myeloma not having achieved remission: Secondary | ICD-10-CM

## 2015-06-02 LAB — CBC WITH DIFFERENTIAL (CANCER CENTER ONLY)
BASO#: 0 10*3/uL (ref 0.0–0.2)
BASO%: 0.2 % (ref 0.0–2.0)
EOS%: 1.3 % (ref 0.0–7.0)
Eosinophils Absolute: 0.1 10*3/uL (ref 0.0–0.5)
HCT: 32.2 % — ABNORMAL LOW (ref 38.7–49.9)
HGB: 10.7 g/dL — ABNORMAL LOW (ref 13.0–17.1)
LYMPH#: 0.9 10*3/uL (ref 0.9–3.3)
LYMPH%: 17.9 % (ref 14.0–48.0)
MCH: 30.1 pg (ref 28.0–33.4)
MCHC: 33.2 g/dL (ref 32.0–35.9)
MCV: 90 fL (ref 82–98)
MONO#: 0.9 10*3/uL (ref 0.1–0.9)
MONO%: 16.8 % — AB (ref 0.0–13.0)
NEUT#: 3.3 10*3/uL (ref 1.5–6.5)
NEUT%: 63.8 % (ref 40.0–80.0)
Platelets: 51 10*3/uL — ABNORMAL LOW (ref 145–400)
RBC: 3.56 10*6/uL — ABNORMAL LOW (ref 4.20–5.70)
RDW: 23.4 % — AB (ref 11.1–15.7)
WBC: 5.2 10*3/uL (ref 4.0–10.0)

## 2015-06-02 LAB — CMP (CANCER CENTER ONLY)
ALT(SGPT): 23 U/L (ref 10–47)
AST: 24 U/L (ref 11–38)
Albumin: 3.5 g/dL (ref 3.3–5.5)
Alkaline Phosphatase: 52 U/L (ref 26–84)
BILIRUBIN TOTAL: 1.6 mg/dL (ref 0.20–1.60)
BUN, Bld: 10 mg/dL (ref 7–22)
CALCIUM: 11.6 mg/dL — AB (ref 8.0–10.3)
CO2: 23 meq/L (ref 18–33)
CREATININE: 1.1 mg/dL (ref 0.6–1.2)
Chloride: 106 mEq/L (ref 98–108)
GLUCOSE: 151 mg/dL — AB (ref 73–118)
Potassium: 3.4 mEq/L (ref 3.3–4.7)
SODIUM: 143 meq/L (ref 128–145)
Total Protein: 6.4 g/dL (ref 6.4–8.1)

## 2015-06-02 LAB — TYPE AND SCREEN
ABO/RH(D): B POS
ANTIBODY SCREEN: POSITIVE
DAT, IGG: NEGATIVE
UNIT DIVISION: 0
UNIT DIVISION: 0

## 2015-06-02 LAB — PREPARE PLATELET PHERESIS: UNIT DIVISION: 0

## 2015-06-02 LAB — TECHNOLOGIST REVIEW CHCC SATELLITE

## 2015-06-02 MED ORDER — ZOLEDRONIC ACID 4 MG/100ML IV SOLN
4.0000 mg | Freq: Once | INTRAVENOUS | Status: AC
  Administered 2015-06-02: 4 mg via INTRAVENOUS
  Filled 2015-06-02: qty 100

## 2015-06-02 MED ORDER — CALCITONIN (SALMON) 200 UNIT/ML IJ SOLN
400.0000 [IU] | Freq: Once | INTRAMUSCULAR | Status: AC
  Administered 2015-06-02: 400 [IU] via INTRAMUSCULAR
  Filled 2015-06-02: qty 2

## 2015-06-02 MED ORDER — METHYLPREDNISOLONE SODIUM SUCC 125 MG IJ SOLR
INTRAMUSCULAR | Status: AC
  Filled 2015-06-02: qty 2

## 2015-06-02 MED ORDER — SODIUM CHLORIDE 0.9 % IV SOLN
Freq: Once | INTRAVENOUS | Status: AC
  Administered 2015-06-02: 09:00:00 via INTRAVENOUS
  Filled 2015-06-02: qty 4

## 2015-06-02 MED ORDER — SODIUM CHLORIDE 0.9 % IJ SOLN
10.0000 mL | INTRAMUSCULAR | Status: DC | PRN
  Administered 2015-06-02: 10 mL
  Filled 2015-06-02: qty 10

## 2015-06-02 MED ORDER — HYDROMORPHONE HCL 4 MG/ML IJ SOLN
2.0000 mg | Freq: Once | INTRAMUSCULAR | Status: AC
  Administered 2015-06-02: 2 mg via INTRAVENOUS

## 2015-06-02 MED ORDER — HYDROMORPHONE HCL 1 MG/ML IJ SOLN
INTRAMUSCULAR | Status: AC
  Filled 2015-06-02: qty 2

## 2015-06-02 MED ORDER — DIPHENHYDRAMINE HCL 25 MG PO CAPS
ORAL_CAPSULE | ORAL | Status: AC
  Filled 2015-06-02: qty 2

## 2015-06-02 MED ORDER — HEPARIN SOD (PORK) LOCK FLUSH 100 UNIT/ML IV SOLN
500.0000 [IU] | Freq: Once | INTRAVENOUS | Status: AC | PRN
  Administered 2015-06-02: 500 [IU]
  Filled 2015-06-02: qty 5

## 2015-06-02 MED ORDER — ACETAMINOPHEN 325 MG PO TABS
650.0000 mg | ORAL_TABLET | Freq: Once | ORAL | Status: AC
  Administered 2015-06-02: 650 mg via ORAL

## 2015-06-02 MED ORDER — SODIUM CHLORIDE 0.9 % IV SOLN
Freq: Once | INTRAVENOUS | Status: AC
  Administered 2015-06-02: 09:00:00 via INTRAVENOUS

## 2015-06-02 MED ORDER — DIPHENHYDRAMINE HCL 25 MG PO CAPS
50.0000 mg | ORAL_CAPSULE | Freq: Once | ORAL | Status: AC
Start: 2015-06-02 — End: 2015-06-02
  Administered 2015-06-02: 50 mg via ORAL

## 2015-06-02 MED ORDER — METHYLPREDNISOLONE SODIUM SUCC 125 MG IJ SOLR
125.0000 mg | Freq: Once | INTRAMUSCULAR | Status: AC
  Administered 2015-06-02: 125 mg via INTRAVENOUS

## 2015-06-02 MED ORDER — ACETAMINOPHEN 325 MG PO TABS
ORAL_TABLET | ORAL | Status: AC
  Filled 2015-06-02: qty 2

## 2015-06-02 MED ORDER — SODIUM CHLORIDE 0.9 % IV SOLN
15.8000 mg/kg | Freq: Once | INTRAVENOUS | Status: AC
  Administered 2015-06-02: 1500 mg via INTRAVENOUS
  Filled 2015-06-02: qty 75

## 2015-06-02 NOTE — Progress Notes (Signed)
Ok to treat per Dr. Ennever 

## 2015-06-02 NOTE — Patient Instructions (Signed)
[facility] Discharge Instructions for Patients Receiving Chemotherapy  Today you received the following chemotherapy agents Darzalex  To help prevent nausea and vomiting after your treatment, we encourage you to take your nausea medication    If you develop nausea and vomiting that is not controlled by your nausea medication, call the clinic.   BELOW ARE SYMPTOMS THAT SHOULD BE REPORTED IMMEDIATELY:  *FEVER GREATER THAN 100.5 F  *CHILLS WITH OR WITHOUT FEVER  NAUSEA AND VOMITING THAT IS NOT CONTROLLED WITH YOUR NAUSEA MEDICATION  *UNUSUAL SHORTNESS OF BREATH  *UNUSUAL BRUISING OR BLEEDING  TENDERNESS IN MOUTH AND THROAT WITH OR WITHOUT PRESENCE OF ULCERS  *URINARY PROBLEMS  *BOWEL PROBLEMS  UNUSUAL RASH Items with * indicate a potential emergency and should be followed up as soon as possible.  Feel free to call the clinic you have any questions or concerns. The clinic phone number is [phone].  Please show the CHEMO ALERT CARD at check-in to the Emergency Department and triage nurse.   

## 2015-06-02 NOTE — Progress Notes (Signed)
Mark [name] is here today for treatment. He has already received his Zometa. His calcium continues to increase and is now 11.6. I spoke with Dr. Marin Olp about this and is was decided to give him calcitonin 400 IU IM today. His Hgb is now up to 10.7 after having received blood yesterday. His platelet count is now 51. Dr. Marin Olp will look at how we should proceed with his treatment from here.

## 2015-06-05 ENCOUNTER — Other Ambulatory Visit: Payer: Self-pay | Admitting: Family

## 2015-06-05 DIAGNOSIS — C9002 Multiple myeloma in relapse: Secondary | ICD-10-CM

## 2015-06-09 ENCOUNTER — Other Ambulatory Visit: Payer: Self-pay | Admitting: Family

## 2015-06-09 ENCOUNTER — Ambulatory Visit (HOSPITAL_BASED_OUTPATIENT_CLINIC_OR_DEPARTMENT_OTHER): Payer: BLUE CROSS/BLUE SHIELD

## 2015-06-09 ENCOUNTER — Inpatient Hospital Stay (HOSPITAL_COMMUNITY)
Admission: AD | Admit: 2015-06-09 | Discharge: 2015-06-16 | DRG: 847 | Disposition: A | Discharge status: Home / Self Care | Payer: BLUE CROSS/BLUE SHIELD | Source: Ambulatory Visit | Attending: Hematology & Oncology | Admitting: Hematology & Oncology

## 2015-06-09 ENCOUNTER — Other Ambulatory Visit (HOSPITAL_BASED_OUTPATIENT_CLINIC_OR_DEPARTMENT_OTHER): Payer: BLUE CROSS/BLUE SHIELD

## 2015-06-09 ENCOUNTER — Encounter (HOSPITAL_COMMUNITY): Payer: Self-pay

## 2015-06-09 VITALS — BP 126/83 | HR 110 | Temp 98.1°F | Resp 20

## 2015-06-09 DIAGNOSIS — C9000 Multiple myeloma not having achieved remission: Secondary | ICD-10-CM | POA: Diagnosis present

## 2015-06-09 DIAGNOSIS — D61818 Other pancytopenia: Secondary | ICD-10-CM | POA: Diagnosis present

## 2015-06-09 DIAGNOSIS — C9002 Multiple myeloma in relapse: Secondary | ICD-10-CM

## 2015-06-09 DIAGNOSIS — D6959 Other secondary thrombocytopenia: Secondary | ICD-10-CM | POA: Diagnosis not present

## 2015-06-09 DIAGNOSIS — M542 Cervicalgia: Secondary | ICD-10-CM | POA: Diagnosis not present

## 2015-06-09 DIAGNOSIS — D696 Thrombocytopenia, unspecified: Secondary | ICD-10-CM

## 2015-06-09 DIAGNOSIS — D703 Neutropenia due to infection: Secondary | ICD-10-CM

## 2015-06-09 DIAGNOSIS — Z5111 Encounter for antineoplastic chemotherapy: Principal | ICD-10-CM

## 2015-06-09 DIAGNOSIS — E860 Dehydration: Secondary | ICD-10-CM

## 2015-06-09 DIAGNOSIS — M8448XA Pathological fracture, other site, initial encounter for fracture: Secondary | ICD-10-CM | POA: Diagnosis present

## 2015-06-09 DIAGNOSIS — T451X5A Adverse effect of antineoplastic and immunosuppressive drugs, initial encounter: Secondary | ICD-10-CM | POA: Diagnosis not present

## 2015-06-09 DIAGNOSIS — R110 Nausea: Secondary | ICD-10-CM | POA: Diagnosis not present

## 2015-06-09 DIAGNOSIS — D501 Sideropenic dysphagia: Secondary | ICD-10-CM

## 2015-06-09 DIAGNOSIS — R52 Pain, unspecified: Secondary | ICD-10-CM | POA: Diagnosis not present

## 2015-06-09 DIAGNOSIS — M549 Dorsalgia, unspecified: Secondary | ICD-10-CM | POA: Diagnosis not present

## 2015-06-09 LAB — CBC WITH DIFFERENTIAL (CANCER CENTER ONLY)
BASO#: 0 10*3/uL (ref 0.0–0.2)
BASO%: 0 % (ref 0.0–2.0)
EOS ABS: 0.1 10*3/uL (ref 0.0–0.5)
EOS%: 1.1 % (ref 0.0–7.0)
HEMATOCRIT: 34.5 % — AB (ref 38.7–49.9)
HEMOGLOBIN: 11.6 g/dL — AB (ref 13.0–17.1)
LYMPH#: 3.1 10*3/uL (ref 0.9–3.3)
LYMPH%: 37.5 % (ref 14.0–48.0)
MCH: 30.3 pg (ref 28.0–33.4)
MCHC: 33.6 g/dL (ref 32.0–35.9)
MCV: 90 fL (ref 82–98)
MONO#: 0.5 10*3/uL (ref 0.1–0.9)
MONO%: 6.6 % (ref 0.0–13.0)
NEUT%: 54.8 % (ref 40.0–80.0)
NEUTROS ABS: 4.5 10*3/uL (ref 1.5–6.5)
Platelets: 15 10*3/uL — ABNORMAL LOW (ref 145–400)
RBC: 3.83 10*6/uL — ABNORMAL LOW (ref 4.20–5.70)
RDW: 20.9 % — ABNORMAL HIGH (ref 11.1–15.7)
WBC: 8.2 10*3/uL (ref 4.0–10.0)

## 2015-06-09 LAB — TECHNOLOGIST REVIEW CHCC SATELLITE

## 2015-06-09 MED ORDER — CYCLOBENZAPRINE HCL 10 MG PO TABS
10.0000 mg | ORAL_TABLET | Freq: Three times a day (TID) | ORAL | Status: DC | PRN
  Administered 2015-06-09 – 2015-06-15 (×12): 10 mg via ORAL
  Filled 2015-06-09 (×12): qty 1

## 2015-06-09 MED ORDER — FENTANYL 75 MCG/HR TD PT72
75.0000 ug | MEDICATED_PATCH | TRANSDERMAL | Status: DC
Start: 2015-06-10 — End: 2015-06-09

## 2015-06-09 MED ORDER — CALCITONIN (SALMON) 200 UNIT/ML IJ SOLN
360.0000 [IU] | Freq: Three times a day (TID) | INTRAMUSCULAR | Status: DC
  Administered 2015-06-09 – 2015-06-11 (×6): 360 [IU] via INTRAMUSCULAR
  Filled 2015-06-09 (×10): qty 1.8

## 2015-06-09 MED ORDER — FENTANYL 100 MCG/HR TD PT72
100.0000 ug | MEDICATED_PATCH | TRANSDERMAL | Status: DC
  Administered 2015-06-09 – 2015-06-15 (×4): 100 ug via TRANSDERMAL
  Filled 2015-06-09 (×4): qty 1

## 2015-06-09 MED ORDER — CALCITONIN (SALMON) 200 UNIT/ML IJ SOLN
716.0000 [IU] | Freq: Three times a day (TID) | INTRAMUSCULAR | Status: DC
  Filled 2015-06-09: qty 3.58

## 2015-06-09 MED ORDER — SODIUM CHLORIDE 0.9 % IV SOLN
INTRAVENOUS | Status: DC
  Administered 2015-06-09: 100 mL/h via INTRAVENOUS
  Administered 2015-06-10 – 2015-06-14 (×3): via INTRAVENOUS

## 2015-06-09 MED ORDER — BISACODYL 5 MG PO TBEC
5.0000 mg | DELAYED_RELEASE_TABLET | Freq: Every day | ORAL | Status: DC | PRN
  Administered 2015-06-11: 5 mg via ORAL
  Filled 2015-06-09: qty 1

## 2015-06-09 MED ORDER — SODIUM CHLORIDE 0.9 % IJ SOLN: 3.0000 mL | INTRAMUSCULAR | Status: DC | PRN

## 2015-06-09 MED ORDER — METOCLOPRAMIDE HCL 10 MG PO TABS: 10.0000 mg | ORAL_TABLET | Freq: Four times a day (QID) | ORAL | Status: DC | PRN

## 2015-06-09 MED ORDER — MORPHINE SULFATE (PF) 10 MG/ML IV SOLN
10.0000 mg | INTRAVENOUS | Status: DC | PRN
  Administered 2015-06-10 – 2015-06-15 (×12): 10 mg via INTRAVENOUS
  Filled 2015-06-09 (×12): qty 1

## 2015-06-09 MED ORDER — CALCITONIN (SALMON) 200 UNIT/ML IJ SOLN
720.0000 [IU] | Freq: Three times a day (TID) | INTRAMUSCULAR | Status: DC
  Filled 2015-06-09 (×3): qty 3.6

## 2015-06-09 MED ORDER — PANTOPRAZOLE SODIUM 40 MG PO TBEC
40.0000 mg | DELAYED_RELEASE_TABLET | Freq: Every day | ORAL | Status: DC
  Administered 2015-06-10 – 2015-06-16 (×7): 40 mg via ORAL
  Filled 2015-06-09 (×8): qty 1

## 2015-06-09 MED ORDER — TEMAZEPAM 7.5 MG PO CAPS: 22.5000 mg | ORAL_CAPSULE | Freq: Every evening | ORAL | Status: DC | PRN

## 2015-06-09 MED ORDER — ONDANSETRON 4 MG PO TBDP: 8.0000 mg | ORAL_TABLET | Freq: Three times a day (TID) | ORAL | Status: DC | PRN

## 2015-06-09 MED ORDER — PROCHLORPERAZINE MALEATE 10 MG PO TABS: 10.0000 mg | ORAL_TABLET | Freq: Four times a day (QID) | ORAL | Status: DC | PRN

## 2015-06-09 MED ORDER — LORAZEPAM 0.5 MG PO TABS: 0.5000 mg | ORAL_TABLET | Freq: Four times a day (QID) | ORAL | Status: DC | PRN

## 2015-06-09 MED ORDER — HEPARIN SOD (PORK) LOCK FLUSH 100 UNIT/ML IV SOLN: 250.0000 [IU] | INTRAVENOUS | Status: DC | PRN

## 2015-06-09 MED ORDER — HEPARIN SOD (PORK) LOCK FLUSH 100 UNIT/ML IV SOLN: 500.0000 [IU] | Freq: Every day | INTRAVENOUS | Status: DC | PRN

## 2015-06-09 MED ORDER — GABAPENTIN 300 MG PO CAPS
600.0000 mg | ORAL_CAPSULE | Freq: Three times a day (TID) | ORAL | Status: DC
  Administered 2015-06-09 – 2015-06-16 (×21): 600 mg via ORAL
  Filled 2015-06-09 (×21): qty 2

## 2015-06-09 MED ORDER — MORPHINE SULFATE 4 MG/ML IJ SOLN
4.0000 mg | Freq: Once | INTRAMUSCULAR | Status: AC
  Administered 2015-06-09: 4 mg via INTRAVENOUS
  Filled 2015-06-09: qty 1

## 2015-06-09 MED ORDER — SODIUM CHLORIDE 0.9 % IJ SOLN: 10.0000 mL | INTRAMUSCULAR | Status: DC | PRN

## 2015-06-09 MED ORDER — MORPHINE SULFATE (PF) 4 MG/ML IV SOLN
INTRAVENOUS | Status: AC
  Filled 2015-06-09: qty 1

## 2015-06-09 MED ORDER — SACCHAROMYCES BOULARDII 250 MG PO CAPS
250.0000 mg | ORAL_CAPSULE | Freq: Two times a day (BID) | ORAL | Status: DC
  Administered 2015-06-10 – 2015-06-16 (×14): 250 mg via ORAL
  Filled 2015-06-09 (×16): qty 1

## 2015-06-09 MED ORDER — CLONAZEPAM 0.5 MG PO TABS: 0.5000 mg | ORAL_TABLET | Freq: Three times a day (TID) | ORAL | Status: DC | PRN

## 2015-06-09 MED ORDER — FAMCICLOVIR 500 MG PO TABS
500.0000 mg | ORAL_TABLET | Freq: Every day | ORAL | Status: DC
  Administered 2015-06-09 – 2015-06-15 (×7): 500 mg via ORAL
  Filled 2015-06-09 (×8): qty 1

## 2015-06-09 MED ORDER — IPRATROPIUM-ALBUTEROL 0.5-2.5 (3) MG/3ML IN SOLN: 3.0000 mL | Freq: Four times a day (QID) | RESPIRATORY_TRACT | Status: DC | PRN

## 2015-06-09 MED ORDER — SODIUM CHLORIDE 0.9 % IV SOLN
Freq: Once | INTRAVENOUS | Status: AC
  Administered 2015-06-10: via INTRAVENOUS

## 2015-06-09 MED ORDER — SODIUM CHLORIDE 0.9 % IV SOLN: 250.0000 mL | Freq: Once | INTRAVENOUS | Status: DC

## 2015-06-09 MED ORDER — FLUCONAZOLE 100 MG PO TABS
100.0000 mg | ORAL_TABLET | Freq: Every day | ORAL | Status: DC
  Administered 2015-06-09 – 2015-06-16 (×8): 100 mg via ORAL
  Filled 2015-06-09 (×8): qty 1

## 2015-06-09 NOTE — H&P (Signed)
Mark [name] is a 44yo AA male with refractory light chain myeloma.  He now has progressive hypercalcemia and needs to be admitted for aggressive salvage chemotherapy.  He has been on Daratumumab and Velcade.  His calcium is going up.  He is having more pain.  He is awaiting an allogeneic SCT from a brother, but his myeloma is not under control for this.  He has had no fever.  There is no nausea/vomiting.  He has a port a cath in place.  There are no rashes.  No weakness.    No bleeding.  He is being admitted for IV hydration, Calcitonin and aggressive chemo with VD-PACE.  He will need transfusions.  I expect that he will be hospitalized for 6-7 days.   Lum Keas

## 2015-06-09 NOTE — Progress Notes (Signed)
Pt remains resting comfortably and quietly at this time. No needs

## 2015-06-10 ENCOUNTER — Inpatient Hospital Stay (HOSPITAL_COMMUNITY): Payer: BLUE CROSS/BLUE SHIELD

## 2015-06-10 DIAGNOSIS — R52 Pain, unspecified: Secondary | ICD-10-CM

## 2015-06-10 LAB — COMPREHENSIVE METABOLIC PANEL
ALT: 22 U/L (ref 17–63)
ANION GAP: 5 (ref 5–15)
AST: 27 U/L (ref 15–41)
Albumin: 4.1 g/dL (ref 3.5–5.0)
Alkaline Phosphatase: 59 U/L (ref 38–126)
BILIRUBIN TOTAL: 1.9 mg/dL — AB (ref 0.3–1.2)
BUN: 14 mg/dL (ref 6–20)
CALCIUM: 12 mg/dL — AB (ref 8.9–10.3)
CO2: 27 mmol/L (ref 22–32)
CREATININE: 1.04 mg/dL (ref 0.61–1.24)
Chloride: 107 mmol/L (ref 101–111)
GLUCOSE: 120 mg/dL — AB (ref 65–99)
POTASSIUM: 4.5 mmol/L (ref 3.5–5.1)
Sodium: 139 mmol/L (ref 135–145)
Total Protein: 6.6 g/dL (ref 6.5–8.1)

## 2015-06-10 LAB — CBC
HEMATOCRIT: 30.7 % — AB (ref 39.0–52.0)
Hemoglobin: 10 g/dL — ABNORMAL LOW (ref 13.0–17.0)
MCH: 30.2 pg (ref 26.0–34.0)
MCHC: 32.6 g/dL (ref 30.0–36.0)
MCV: 92.7 fL (ref 78.0–100.0)
PLATELETS: 57 10*3/uL — AB (ref 150–400)
RBC: 3.31 MIL/uL — ABNORMAL LOW (ref 4.22–5.81)
RDW: 21.2 % — AB (ref 11.5–15.5)
WBC: 5.4 10*3/uL (ref 4.0–10.5)

## 2015-06-10 LAB — PREPARE PLATELET PHERESIS: UNIT DIVISION: 0

## 2015-06-10 MED ORDER — BORTEZOMIB CHEMO IV INJECTION 3.5 MG
1.0000 mg/m2 | Freq: Once | INTRAMUSCULAR | Status: AC
  Administered 2015-06-10: 2.1 mg via INTRAVENOUS
  Filled 2015-06-10: qty 2.1

## 2015-06-10 MED ORDER — SODIUM BICARBONATE/SODIUM CHLORIDE MOUTHWASH
Freq: Four times a day (QID) | OROMUCOSAL | Status: DC
  Administered 2015-06-10 – 2015-06-12 (×7): via OROMUCOSAL
  Administered 2015-06-12: 1 via OROMUCOSAL
  Administered 2015-06-12 – 2015-06-15 (×8): via OROMUCOSAL
  Filled 2015-06-10: qty 1000

## 2015-06-10 MED ORDER — SODIUM CHLORIDE 0.9 % IV SOLN: INTRAVENOUS | Status: DC

## 2015-06-10 MED ORDER — SODIUM CHLORIDE 0.9 % IV SOLN
Freq: Once | INTRAVENOUS | Status: AC
  Administered 2015-06-10: 17:00:00 via INTRAVENOUS
  Filled 2015-06-10: qty 5

## 2015-06-10 MED ORDER — DEXAMETHASONE 4 MG PO TABS
40.0000 mg | ORAL_TABLET | Freq: Once | ORAL | Status: AC
  Administered 2015-06-10: 40 mg via ORAL
  Filled 2015-06-10 (×2): qty 10

## 2015-06-10 MED ORDER — HOT PACK MISC ONCOLOGY
1.0000 | Freq: Once | Status: DC | PRN
  Filled 2015-06-10: qty 1

## 2015-06-10 MED ORDER — SODIUM CHLORIDE 0.9 % IJ SOLN: 10.0000 mL | INTRAMUSCULAR | Status: DC | PRN

## 2015-06-10 MED ORDER — COLD PACK MISC ONCOLOGY
1.0000 | Freq: Once | Status: DC | PRN
  Filled 2015-06-10: qty 1

## 2015-06-10 MED ORDER — PALONOSETRON HCL INJECTION 0.25 MG/5ML
0.2500 mg | Freq: Once | INTRAVENOUS | Status: AC
  Administered 2015-06-10: 0.25 mg via INTRAVENOUS
  Filled 2015-06-10: qty 5

## 2015-06-10 MED ORDER — BIOTENE DRY MOUTH MT LIQD
15.0000 mL | Freq: Four times a day (QID) | OROMUCOSAL | Status: DC
  Administered 2015-06-10 – 2015-06-16 (×17): 15 mL via OROMUCOSAL

## 2015-06-10 MED ORDER — SODIUM CHLORIDE 0.9 % IJ SOLN: 3.0000 mL | INTRAMUSCULAR | Status: DC | PRN

## 2015-06-10 MED ORDER — SODIUM CHLORIDE 0.9 % IV SOLN
7.5000 mg/m2 | Freq: Once | INTRAVENOUS | Status: AC
  Administered 2015-06-10: 16 mg via INTRAVENOUS
  Filled 2015-06-10: qty 8

## 2015-06-10 MED ORDER — SODIUM CHLORIDE 0.9 % IJ SOLN
10.0000 mL | INTRAMUSCULAR | Status: DC | PRN
  Administered 2015-06-14: 40 mL
  Filled 2015-06-10: qty 40

## 2015-06-10 MED ORDER — ALTEPLASE 2 MG IJ SOLR: 2.0000 mg | Freq: Once | INTRAMUSCULAR | Status: DC | PRN

## 2015-06-10 MED ORDER — SODIUM CHLORIDE 0.9 % IJ SOLN
10.0000 mL | Freq: Two times a day (BID) | INTRAMUSCULAR | Status: DC
  Administered 2015-06-10: 10 mL
  Administered 2015-06-10: 20 mL
  Administered 2015-06-11: 10 mL
  Administered 2015-06-13: 20 mL
  Administered 2015-06-14 – 2015-06-15 (×4): 10 mL

## 2015-06-10 MED ORDER — POTASSIUM CHLORIDE 2 MEQ/ML IV SOLN
Freq: Once | INTRAVENOUS | Status: AC
  Administered 2015-06-10: 15:00:00 via INTRAVENOUS
  Filled 2015-06-10: qty 10

## 2015-06-10 MED ORDER — SODIUM CHLORIDE 0.9 % IV SOLN
Freq: Once | INTRAVENOUS | Status: AC
  Administered 2015-06-10: 840 mg via INTRAVENOUS
  Filled 2015-06-10: qty 21

## 2015-06-10 NOTE — Progress Notes (Signed)
Peripherally Inserted Central Catheter/Midline Placement  The IV Nurse has discussed with the patient and/or persons authorized to consent for the patient, the purpose of this procedure and the potential benefits and risks involved with this procedure.  The benefits include less needle sticks, lab draws from the catheter and patient may be discharged home with the catheter.  Risks include, but not limited to, infection, bleeding, blood clot (thrombus formation), and puncture of an artery; nerve damage and irregular heat beat.  Alternatives to this procedure were also discussed.  PICC/Midline Placement Documentation        Darlyn Read 06/10/2015, 1:49 PM

## 2015-06-10 NOTE — Progress Notes (Signed)
Pharmacy - Chemotherapy  I spoke with Dr. Marin Olp regarding Mr. Shimel elevated total bilirubin and thrombocytopenia to recommend chemo dose-reductions.  1. Velcade reduce from 1.3mg /m2 to 1mg /m2 2. Doxorubicin reduce 25% 3. Etoposide reduce 25% (he felt the recommended 50% reduction was too great)    Romeo Rabon, PharmD, pager [phone]. 06/10/2015,8:54 AM.

## 2015-06-10 NOTE — Progress Notes (Signed)
BSA, DOSAGES and DILUTIONS of Velcade, Adriamycin, Cisplatin, Cytoxan and Vepesid verified with 2nd RN Aldean Baker.

## 2015-06-10 NOTE — Progress Notes (Signed)
Mark [name] did well last night. He rested pretty well. His pain control seems to be doing a little better.  He will start chemotherapy today. He will need have a double lumen PICC line placed.  He's had no nausea or vomiting. He did have an episode of emesis when he was admitted. This might have been anticipatory.  His calcium is stable right now. His calcium today was 12.0.  I have a 24-hour urine collecting on him so again see how his light chains look.  He is getting out of bed. He is walking.  His platelet count today is 57,000. I'm not sure why it was 15,000 yesterday.  He's had no bleeding. He's had no bruising.  There's been no leg swelling.  He is due for an MRI of his pelvis tomorrow. He was scheduled for this as an outpatient. We may have to change this to an inpatient.  His wife was with him today.  [name], his vital signs are stable. Temperature 98.6. Pulse 106. Blood pressure 103/51. Head and neck exam shows no ocular or oral lesions. He has no mucositis. His lungs are clear. Cardiac exam regular rate and rhythm with no murmurs, rubs or bruits. Abdomen is soft. Has good bowel sounds. There is no fluid wave. There is no palpable liver or spleen tip. Extremities shows no clubbing, cyanosis or edema. He has good strength in his extremity Spe[place] skin exam shows no rashes or ecchymosis. Neurological exam is nonfocal.  We will go ahead and get chemotherapy started on Mark [name]. He has refractory light chain myeloma. Again, he is trying to get to an allogeneic transplant. His brother will be the donor. However, his myeloma needs to be in much better regression. Hopefully we can get him there.  He is very much aware of the fact that his chances are just not that great. His myeloma has shows all to be incredibly resilient. Light chain myeloma tends to be more aggressive.  His wife is doing great job trying to help him. She also has her own set of health issues.  He has a  very strong faith. We prayed this morning.  I appreciate all the great care that he is getting from the staff up on [address]. He really enjoys being on the floor because he knows that he will get outstanding care.  [place] 91:14-16

## 2015-06-11 ENCOUNTER — Ambulatory Visit (HOSPITAL_COMMUNITY): Payer: BLUE CROSS/BLUE SHIELD

## 2015-06-11 ENCOUNTER — Ambulatory Visit (HOSPITAL_COMMUNITY)
Admission: RE | Admit: 2015-06-11 | Payer: BLUE CROSS/BLUE SHIELD | Source: Ambulatory Visit | Admitting: Hematology & Oncology

## 2015-06-11 DIAGNOSIS — E875 Hyperkalemia: Secondary | ICD-10-CM

## 2015-06-11 LAB — CBC WITH DIFFERENTIAL/PLATELET
Basophils Absolute: 0 10*3/uL (ref 0.0–0.1)
Basophils Relative: 0 %
Eosinophils Absolute: 0 10*3/uL (ref 0.0–0.7)
Eosinophils Relative: 0 %
HCT: 31 % — ABNORMAL LOW (ref 39.0–52.0)
HEMOGLOBIN: 10.2 g/dL — AB (ref 13.0–17.0)
Lymphocytes Relative: 26 %
Lymphs Abs: 2.2 10*3/uL (ref 0.7–4.0)
MCH: 29.9 pg (ref 26.0–34.0)
MCHC: 32.9 g/dL (ref 30.0–36.0)
MCV: 90.9 fL (ref 78.0–100.0)
MONO ABS: 0.2 10*3/uL (ref 0.1–1.0)
Monocytes Relative: 2 %
NEUTROS ABS: 6.2 10*3/uL (ref 1.7–7.7)
Neutrophils Relative %: 72 %
Platelets: 49 10*3/uL — ABNORMAL LOW (ref 150–400)
RBC: 3.41 MIL/uL — AB (ref 4.22–5.81)
RDW: 20.9 % — ABNORMAL HIGH (ref 11.5–15.5)
WBC: 8.6 10*3/uL (ref 4.0–10.5)

## 2015-06-11 LAB — UIFE/LIGHT CHAINS/TP QN, 24-HR UR
% BETA, Urine: 76.1 %
ALPHA 1 URINE: 1.8 %
ALPHA 2 UR: 5 %
Albumin, U: 16.8 %
Free Kappa/Lambda Ratio: 5048.08 — ABNORMAL HIGH (ref 2.04–10.37)
Free Lambda Lt Chains,Ur: 2.08 mg/L (ref 0.24–6.66)
GAMMA GLOBULIN URINE: 0.3 %
M-SPIKE %, URINE: 71.7 % — AB
M-Spike, Mg/24 Hr: 2304 mg/24 hr — ABNORMAL HIGH
Time: 24 hours
Total Protein, Urine-Ur/day: 3213 mg/24 hr — ABNORMAL HIGH (ref 30.0–150.0)
Total Protein, Urine: 102 mg/dL
VOLUME, URINE-UPE24: 3150 mL

## 2015-06-11 LAB — COMPREHENSIVE METABOLIC PANEL
ALBUMIN: 4.2 g/dL (ref 3.5–5.0)
ALK PHOS: 65 U/L (ref 38–126)
ALT: 24 U/L (ref 17–63)
ANION GAP: 6 (ref 5–15)
AST: 38 U/L (ref 15–41)
BILIRUBIN TOTAL: 1.5 mg/dL — AB (ref 0.3–1.2)
BUN: 13 mg/dL (ref 6–20)
CALCIUM: 12 mg/dL — AB (ref 8.9–10.3)
CO2: 23 mmol/L (ref 22–32)
Chloride: 108 mmol/L (ref 101–111)
Creatinine, Ser: 1.03 mg/dL (ref 0.61–1.24)
GFR calc Af Amer: >60 mL/min (ref >60)
GLUCOSE: 165 mg/dL — AB (ref 65–99)
Potassium: 5.6 mmol/L — ABNORMAL HIGH (ref 3.5–5.1)
Sodium: 137 mmol/L (ref 135–145)
TOTAL PROTEIN: 7.1 g/dL (ref 6.5–8.1)

## 2015-06-11 LAB — BASIC METABOLIC PANEL
Anion gap: 7 (ref 5–15)
BUN: 14 mg/dL (ref 6–20)
CALCIUM: 11.9 mg/dL — AB (ref 8.9–10.3)
CO2: 21 mmol/L — ABNORMAL LOW (ref 22–32)
Chloride: 107 mmol/L (ref 101–111)
Creatinine, Ser: 1.04 mg/dL (ref 0.61–1.24)
GFR calc Af Amer: >60 mL/min (ref >60)
GLUCOSE: 152 mg/dL — AB (ref 65–99)
Potassium: 5.4 mmol/L — ABNORMAL HIGH (ref 3.5–5.1)
SODIUM: 135 mmol/L (ref 135–145)

## 2015-06-11 LAB — KAPPA/LAMBDA LIGHT CHAINS
KAPPA FREE LGHT CHN: 930 mg/L — AB (ref 3.30–19.40)
Kappa, lambda light chain ratio: 465 — ABNORMAL HIGH (ref 0.26–1.65)
Lambda free light chains: 2 mg/L — ABNORMAL LOW (ref 5.71–26.30)

## 2015-06-11 MED ORDER — SODIUM CHLORIDE 0.9 % IV SOLN
7.5000 mg/m2 | Freq: Once | INTRAVENOUS | Status: AC
  Administered 2015-06-11: 16 mg via INTRAVENOUS
  Filled 2015-06-11 (×2): qty 8

## 2015-06-11 MED ORDER — SODIUM CHLORIDE 0.9 % IV SOLN
Freq: Once | INTRAVENOUS | Status: AC
  Administered 2015-06-11: 21 mg via INTRAVENOUS
  Filled 2015-06-11 (×2): qty 21

## 2015-06-11 MED ORDER — POTASSIUM CHLORIDE 2 MEQ/ML IV SOLN
Freq: Once | INTRAVENOUS | Status: AC
  Administered 2015-06-11: 16:00:00 via INTRAVENOUS
  Filled 2015-06-11: qty 10

## 2015-06-11 MED ORDER — LACTULOSE 10 GM/15ML PO SOLN
20.0000 g | Freq: Three times a day (TID) | ORAL | Status: DC
  Administered 2015-06-11 (×3): 20 g via ORAL
  Filled 2015-06-11 (×6): qty 30

## 2015-06-11 MED ORDER — DEXAMETHASONE 4 MG PO TABS
40.0000 mg | ORAL_TABLET | Freq: Once | ORAL | Status: AC
  Administered 2015-06-11: 40 mg via ORAL
  Filled 2015-06-11: qty 10

## 2015-06-11 NOTE — Progress Notes (Signed)
[name] is doing pretty well this morning. He had a quiet day yesterday. His chemotherapy is going without problems. I appreciate the great care that the nurses are giving him.  His potassium was up a little bit this morning. I'm not sure as to why this is. I will repeat the potassium later on today.  His calcium is 12.0 which is holding pretty stable.  He does have some pain issues. He is on the fentanyl patch. He does have short acting morphine which is helping him.  I think he is due for a MRI today. This was scheduled as an outpatient but I suppose could be done as inpatient. If he is constipated. We will see about getting him some lactulose to help with the constipation.  He's had no bleeding.  His appetite has been okay. He would eat more if he can just go to the bathroom.  He's had no fever. He's had no cough. He's had no nausea. He is using mouth rinses to help with mucositis.  [name], his vital signs are stable. Blood pressure is 103/63. Temperature 98.4. Pulse is 90. Head and neck exam shows no ocular or oral lesions. He has no palpable cervical or supraclavicular lymph nodes. Lungs are clear. Cardiac exam regular in rhythm with no murmurs, rubs or bruits. Abdomen is soft. He has good bowel sounds. There is no fluid wave. There is no palpable liver or spleen tip. Extremities shows no clubbing, cyanosis or edema. Neurological exam shows no focal deficits. Skin exam shows no rashes, ecchymoses or petechia.  His platelet count is 49,000. Hemoglobin is 10.2.  Mark [name] has refractory light chain myeloma. We are trying to get him to an allogeneic transplant. We are trying to get him into a better remission. I'm unsure if we can do this. However, we will try.  He did completely 24-hour urine. We will see what his light chain levels are.  We will repeat his potassium level today. We'll see what that shows.  His wife was listening on the cell phone. I answered her questions.  Her affect again, I totally appreciate the outstanding care that the staff on [address] is giving him.  Pete E.  Romans 8:28

## 2015-06-12 ENCOUNTER — Telehealth: Payer: Self-pay | Admitting: Hematology & Oncology

## 2015-06-12 ENCOUNTER — Ambulatory Visit: Payer: BLUE CROSS/BLUE SHIELD

## 2015-06-12 ENCOUNTER — Inpatient Hospital Stay (HOSPITAL_COMMUNITY): Payer: BLUE CROSS/BLUE SHIELD

## 2015-06-12 DIAGNOSIS — R110 Nausea: Secondary | ICD-10-CM

## 2015-06-12 LAB — CBC WITH DIFFERENTIAL/PLATELET
BASOS ABS: 0 10*3/uL (ref 0.0–0.1)
Basophils Relative: 0 %
EOS ABS: 0 10*3/uL (ref 0.0–0.7)
Eosinophils Relative: 0 %
HCT: 26.4 % — ABNORMAL LOW (ref 39.0–52.0)
HEMOGLOBIN: 8.9 g/dL — AB (ref 13.0–17.0)
LYMPHS ABS: 1 10*3/uL (ref 0.7–4.0)
LYMPHS PCT: 10 %
MCH: 30.5 pg (ref 26.0–34.0)
MCHC: 33.7 g/dL (ref 30.0–36.0)
MCV: 90.4 fL (ref 78.0–100.0)
Monocytes Absolute: 0.3 10*3/uL (ref 0.1–1.0)
Monocytes Relative: 3 %
NEUTROS PCT: 87 %
Neutro Abs: 8.6 10*3/uL — ABNORMAL HIGH (ref 1.7–7.7)
Platelets: 34 10*3/uL — ABNORMAL LOW (ref 150–400)
RBC: 2.92 MIL/uL — AB (ref 4.22–5.81)
RDW: 20.7 % — ABNORMAL HIGH (ref 11.5–15.5)
WBC: 9.9 10*3/uL (ref 4.0–10.5)

## 2015-06-12 LAB — COMPREHENSIVE METABOLIC PANEL
ALK PHOS: 53 U/L (ref 38–126)
ALT: 19 U/L (ref 17–63)
AST: 29 U/L (ref 15–41)
Albumin: 3.8 g/dL (ref 3.5–5.0)
Anion gap: 5 (ref 5–15)
BUN: 18 mg/dL (ref 6–20)
CALCIUM: 11.5 mg/dL — AB (ref 8.9–10.3)
CHLORIDE: 106 mmol/L (ref 101–111)
CO2: 21 mmol/L — AB (ref 22–32)
CREATININE: 1.03 mg/dL (ref 0.61–1.24)
GFR calc non Af Amer: >60 mL/min (ref >60)
Glucose, Bld: 173 mg/dL — ABNORMAL HIGH (ref 65–99)
Potassium: 5.1 mmol/L (ref 3.5–5.1)
SODIUM: 132 mmol/L — AB (ref 135–145)
Total Bilirubin: 1 mg/dL (ref 0.3–1.2)
Total Protein: 6.3 g/dL — ABNORMAL LOW (ref 6.5–8.1)

## 2015-06-12 MED ORDER — BISACODYL 5 MG PO TBEC
10.0000 mg | DELAYED_RELEASE_TABLET | Freq: Two times a day (BID) | ORAL | Status: DC
  Administered 2015-06-12 – 2015-06-16 (×9): 10 mg via ORAL
  Filled 2015-06-12 (×9): qty 2

## 2015-06-12 MED ORDER — DOXORUBICIN HCL CHEMO IV INJECTION 2 MG/ML
7.5000 mg/m2 | Freq: Once | INTRAVENOUS | Status: AC
  Administered 2015-06-12: 16 mg via INTRAVENOUS
  Filled 2015-06-12 (×2): qty 8

## 2015-06-12 MED ORDER — DEXAMETHASONE 4 MG PO TABS
40.0000 mg | ORAL_TABLET | Freq: Once | ORAL | Status: AC
  Administered 2015-06-12: 40 mg via ORAL
  Filled 2015-06-12 (×2): qty 10

## 2015-06-12 MED ORDER — SODIUM CHLORIDE 0.9 % IV SOLN
Freq: Once | INTRAVENOUS | Status: AC
  Administered 2015-06-12: 21 mg via INTRAVENOUS
  Filled 2015-06-12 (×2): qty 21

## 2015-06-12 MED ORDER — POTASSIUM CHLORIDE 2 MEQ/ML IV SOLN
Freq: Once | INTRAVENOUS | Status: AC
  Administered 2015-06-12: 17:00:00 via INTRAVENOUS
  Filled 2015-06-12: qty 10

## 2015-06-12 MED ORDER — PALONOSETRON HCL INJECTION 0.25 MG/5ML
0.2500 mg | Freq: Once | INTRAVENOUS | Status: AC
Start: 2015-06-12 — End: 2015-06-12
  Administered 2015-06-12: 0.25 mg via INTRAVENOUS
  Filled 2015-06-12: qty 5

## 2015-06-12 NOTE — Telephone Encounter (Signed)
°  Pt in ofc today for me to fax ib's for his claims.  Also faxed disability forms to [employer] [facility]  F: [phone] P: [phone]   Id: [id number]    COPY SCANNED

## 2015-06-12 NOTE — Progress Notes (Signed)
Mark [name] is a little tired this morning. He has little bit of nausea from the chemotherapy. He is had a little bit of a bowel movement. He wants some Dulcolax. This is fine.  So far, chemotherapy has been going okay.  His potassium is coming down. I'm unsure of pharmacy took potassium out of his IV fluids. Is now down to 5.1.  His blood calcium is now down to 11.5. Hopefully this is from the fluids, calcitonin that he got.  I very much appreciate pharmacies help with try to manage all of his fluids and chemotherapy dosing.  His 24-hour urine is completed. The primary shows 3200 mg of protein. I'm sure most of this if not all is from his light chains. He has 2300 mg of M spike. His actual Kappa Lightchain is 10,500 mg.  His chest x-ray on admission showed a new lung mass. This is in the left lung field. It measures 4.4 x 4.3cm.  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 He has lytic lesions throughout his ribs.  He still complaining of neck pain. He's having the back pain. We will get an MRI to see exactly what is going on.  I think is painfully obvious that his myeloma is becoming more resilient to therapy. It is hard to say if he is converting over to some type of plasma cell leukemia.  His appetite still seems to be doing okay.  He's had no bleeding.  His liver tests have normalized. His creatinine is 1.03.  His platelets are 34,000. Hemoglobin is 8.9. He does not need any transfusions.  [name], his vital signs are all stable. His temperature is 98.1. Pulse 93. Blood pressure 116/69. Head and neck exam shows no ocular or oral lesions. He has no palpable cervical or supraclavicular lymph nodes. Lungs are clear bilaterally. Cardiac exam regular rate and rhythm with no murmurs, rubs or bruits. Abdomen is soft. He has good bowel sounds. There is no fluid wave. There is no palpable liver or spleen tip. Extremities shows no clubbing, cyanosis or edema.  Mark [name] has refractory Kappa  light chain myeloma. We are desperately trying to get him to an allogeneic transplant. His brother is a Orthoptist. However, we just cannot get him into any kind of partial remission. I actually believe this is our last chance at trying to get him into a partial remission. His calcium always shows [place] how his myeloma is responding.  I will order MRIs. I suspect they will be done over the weekend.  I appreciate the outstanding care that he is getting from the staff on [address]. Everybody is doing a great job with try to help him. He is very appreciative of this.  Pete E.  2 Thessalonians 3:3

## 2015-06-12 NOTE — Care Management Note (Signed)
Case Management Note  Patient Details  Name: Mark [name] MRN: [id number] Date of Birth: 22-Dec-1970  Subjective/Objective:          44 yo admitted with Multiple Myeloma for chemo          Action/Plan: From home with family  Expected Discharge Date:   (unknown)               Expected Discharge Plan:  Home/Self Care  In-House Referral:     Discharge planning Services  CM Consult  Post Acute Care Choice:    Choice offered to:     DME Arranged:    DME Agency:     HH Arranged:    [place] Agency:     Status of Service:  Completed, signed off  Medicare Important Message Given:    Date Medicare IM Given:    Medicare IM give by:    Date Additional Medicare IM Given:    Additional Medicare Important Message give by:     If discussed at [place] of Stay Meetings, dates discussed:    Additional Comments: Chart reviewed and CM following for DC needs. Lynnell Catalan, RN 06/12/2015, 11:48 AM

## 2015-06-12 NOTE — Progress Notes (Signed)
Chemo dosages and calculation done. Checked with Lottie Dawson RN.

## 2015-06-13 ENCOUNTER — Other Ambulatory Visit: Payer: Self-pay | Admitting: Hematology

## 2015-06-13 DIAGNOSIS — D696 Thrombocytopenia, unspecified: Secondary | ICD-10-CM

## 2015-06-13 DIAGNOSIS — Z5111 Encounter for antineoplastic chemotherapy: Principal | ICD-10-CM

## 2015-06-13 DIAGNOSIS — C9002 Multiple myeloma in relapse: Secondary | ICD-10-CM

## 2015-06-13 LAB — COMPREHENSIVE METABOLIC PANEL
ALK PHOS: 51 U/L (ref 38–126)
ALT: 17 U/L (ref 17–63)
AST: 21 U/L (ref 15–41)
Albumin: 3.8 g/dL (ref 3.5–5.0)
Anion gap: 5 (ref 5–15)
BILIRUBIN TOTAL: 0.8 mg/dL (ref 0.3–1.2)
BUN: 19 mg/dL (ref 6–20)
CALCIUM: 12.3 mg/dL — AB (ref 8.9–10.3)
CO2: 22 mmol/L (ref 22–32)
CREATININE: 1.04 mg/dL (ref 0.61–1.24)
Chloride: 107 mmol/L (ref 101–111)
Glucose, Bld: 187 mg/dL — ABNORMAL HIGH (ref 65–99)
Potassium: 4.9 mmol/L (ref 3.5–5.1)
SODIUM: 134 mmol/L — AB (ref 135–145)
TOTAL PROTEIN: 6.4 g/dL — AB (ref 6.5–8.1)

## 2015-06-13 LAB — CBC WITH DIFFERENTIAL/PLATELET
BASOS PCT: 0 %
Basophils Absolute: 0 10*3/uL (ref 0.0–0.1)
EOS PCT: 0 %
Eosinophils Absolute: 0 10*3/uL (ref 0.0–0.7)
HEMATOCRIT: 25.6 % — AB (ref 39.0–52.0)
HEMOGLOBIN: 8.6 g/dL — AB (ref 13.0–17.0)
LYMPHS PCT: 4 %
Lymphs Abs: 0.4 10*3/uL — ABNORMAL LOW (ref 0.7–4.0)
MCH: 30 pg (ref 26.0–34.0)
MCHC: 33.6 g/dL (ref 30.0–36.0)
MCV: 89.2 fL (ref 78.0–100.0)
MONOS PCT: 4 %
Monocytes Absolute: 0.4 10*3/uL (ref 0.1–1.0)
NEUTROS ABS: 10.2 10*3/uL — AB (ref 1.7–7.7)
NEUTROS PCT: 92 %
Platelets: 23 10*3/uL — CL (ref 150–400)
RBC: 2.87 MIL/uL — ABNORMAL LOW (ref 4.22–5.81)
RDW: 21.4 % — ABNORMAL HIGH (ref 11.5–15.5)
WBC: 11 10*3/uL — ABNORMAL HIGH (ref 4.0–10.5)

## 2015-06-13 MED ORDER — SODIUM CHLORIDE 0.9 % IV SOLN: 90.0000 mg | Freq: Once | INTRAVENOUS | Status: DC

## 2015-06-13 MED ORDER — SODIUM CHLORIDE 0.9 % IV SOLN
Freq: Once | INTRAVENOUS | Status: AC
  Administered 2015-06-13: 21 mg via INTRAVENOUS
  Filled 2015-06-13 (×2): qty 21

## 2015-06-13 MED ORDER — SODIUM CHLORIDE 0.9 % IV SOLN
7.5000 mg/m2 | Freq: Once | INTRAVENOUS | Status: AC
  Administered 2015-06-13: 16 mg via INTRAVENOUS
  Filled 2015-06-13 (×2): qty 8

## 2015-06-13 MED ORDER — SODIUM CHLORIDE 0.9 % IV SOLN
90.0000 mg | Freq: Once | INTRAVENOUS | Status: AC
  Administered 2015-06-13: 90 mg via INTRAVENOUS
  Filled 2015-06-13: qty 10

## 2015-06-13 MED ORDER — POTASSIUM CHLORIDE 2 MEQ/ML IV SOLN
Freq: Once | INTRAVENOUS | Status: AC
  Administered 2015-06-13: 18:00:00 via INTRAVENOUS
  Filled 2015-06-13: qty 10

## 2015-06-13 MED ORDER — BORTEZOMIB CHEMO IV INJECTION 3.5 MG
1.0000 mg/m2 | Freq: Once | INTRAMUSCULAR | Status: AC
  Administered 2015-06-13: 2.1 mg via INTRAVENOUS
  Filled 2015-06-13 (×2): qty 2.1

## 2015-06-13 MED ORDER — DEXAMETHASONE 4 MG PO TABS
40.0000 mg | ORAL_TABLET | Freq: Once | ORAL | Status: AC
  Administered 2015-06-13: 40 mg via ORAL
  Filled 2015-06-13: qty 10

## 2015-06-13 NOTE — Progress Notes (Signed)
Bag of chemo with CDDP, cytoxan and VP16 to be hung but expiration on bag 11/112/16 at 1800 spoke to pharmacist and she said it would still be OK after expiration time to run tomorrow.

## 2015-06-13 NOTE — Progress Notes (Signed)
Mark [name]   DOB:Aug 13, 1970   ZE#:[id number]   AUQ#:[id number]  Subjective: Pt is tolerating chem well, no complains. VS stable, afebrile, no bleeding. Urine output adequate    Objective:  Filed Vitals:   06/13/15 0520  BP: 115/76  Pulse: 100  Temp: 98.4 F (36.9 C)  Resp: 16    Body mass index is 25.62 kg/(m^2).  Intake/Output Summary (Last 24 hours) at 06/13/15 1217 Last data filed at 06/13/15 0900  Gross per 24 hour  Intake 4665.87 ml  Output   2475 ml  Net 2190.87 ml     Sclerae unicteric  Oropharynx clear  No peripheral adenopathy  Lungs clear -- no rales or rhonchi  Heart regular rate and rhythm  Abdomen benign  MSK no focal spinal tenderness, no peripheral edema  Neuro nonfocal  CBG (last 3)  No results for input(s): GLUCAP in the last 72 hours.   Labs:  Lab Results  Component Value Date   WBC 11.0* 06/13/2015   HGB 8.6* 06/13/2015   HCT 25.6* 06/13/2015   MCV 89.2 06/13/2015   PLT 23* 06/13/2015   NEUTROABS 10.2* 06/13/2015    _0 @  Urine Studies No results for input(s): UHGB, CRYS in the last 72 hours.  Invalid input(s): UACOL, UAPR, USPG, UPH, UTP, UGL, UKET, UBIL, UNIT, UROB, [place], UEPI, UWBC, Duwayne Heck [place], [place]  Basic Metabolic Panel:  Recent Labs Lab 06/10/15 0510 06/11/15 0445 06/11/15 1230 06/12/15 0500 06/13/15 0558  NA 139 137 135 132* 134*  K 4.5 5.6* 5.4* 5.1 4.9  CL 107 108 107 106 107  CO2 27 23 21* 21* 22  GLUCOSE 120* 165* 152* 173* 187*  BUN _1 CREATININE 1.04 1.03 1.04 1.03 1.04  CALCIUM 12.0* 12.0* 11.9* 11.5* 12.3*   GFR Estimated Creatinine Clearance: 98.5 mL/min (by C-G formula based on Cr of 1.04). Liver Function Tests:  Recent Labs Lab 06/09/15 0814 06/10/15 0510 06/11/15 0445 06/12/15 0500 06/13/15 0558  AST 29 27 38 29 21  ALT _2 ALKPHOS 59 59 65 53 51  BILITOT 1.70* 1.9* 1.5* 1.0 0.8  PROT 7.1 6.6 7.1 6.3* 6.4*  ALBUMIN 4.1 4.1 4.2 3.8 3.8   No  results for input(s): LIPASE, AMYLASE in the last 168 hours. No results for input(s): AMMONIA in the last 168 hours. Coagulation profile No results for input(s): INR, PROTIME in the last 168 hours.  CBC:  Recent Labs Lab 06/09/15 0814 06/10/15 0510 06/11/15 0445 06/12/15 0500 06/13/15 0558  WBC 8.2 5.4 8.6 9.9 11.0*  NEUTROABS 4.5  --  6.2 8.6* 10.2*  HGB 11.6* 10.0* 10.2* 8.9* 8.6*  HCT 34.5* 30.7* 31.0* 26.4* 25.6*  MCV 90 92.7 90.9 90.4 89.2  PLT 15* 57* 49* 34* 23*   Cardiac Enzymes: No results for input(s): CKTOTAL, CKMB, CKMBINDEX, TROPONINI in the last 168 hours. BNP: Invalid input(s): POCBNP CBG: No results for input(s): GLUCAP in the last 168 hours. D-Dimer No results for input(s): DDIMER in the last 72 hours. Hgb A1c No results for input(s): HGBA1C in the last 72 hours. Lipid Profile No results for input(s): CHOL, HDL, LDLCALC, TRIG, CHOLHDL, LDLDIRECT in the last 72 hours. Thyroid function studies No results for input(s): TSH, T4TOTAL, T3FREE, THYROIDAB in the last 72 hours.  Invalid input(s): FREET3 Anemia work up No results for input(s): VITAMINB12, FOLATE, FERRITIN, TIBC, IRON, RETICCTPCT in the last 72 hours. Microbiology No results found for this or any previous visit (from  the past 240 hour(s)).    Studies:  No results found.  Assessment: 43 y.o. with refractory multiple myeloma, was admitted for chemotherapy VD-PACE  1. Refractory multiple myeloma 2. Hypercalcemia, secondary to #1, ca 12.3 today 3. Anemia Hb 8.6 today 4. Thrombocytopenia, platelets 23 today, received plt transfusion 3-4 days ago    Plan:  -continue chemo, including velcade -monitor CBC daily, and consider plt transfusion if <20K or active bleeding -I will give a dose of Aredia today giving increasing hypercalcemia  -continue IVF hydration, and monitoring urine out put    Truitt Merle, MD 06/13/2015  12:17 PM  [phone]

## 2015-06-14 LAB — CBC WITH DIFFERENTIAL/PLATELET
BASOS ABS: 0 10*3/uL (ref 0.0–0.1)
BASOS PCT: 0 %
EOS ABS: 0 10*3/uL (ref 0.0–0.7)
Eosinophils Relative: 0 %
HCT: 23.4 % — ABNORMAL LOW (ref 39.0–52.0)
HEMOGLOBIN: 8.1 g/dL — AB (ref 13.0–17.0)
LYMPHS PCT: 3 %
Lymphs Abs: 0.2 10*3/uL — ABNORMAL LOW (ref 0.7–4.0)
MCH: 31.2 pg (ref 26.0–34.0)
MCHC: 34.6 g/dL (ref 30.0–36.0)
MCV: 90 fL (ref 78.0–100.0)
MONO ABS: 0.2 10*3/uL (ref 0.1–1.0)
Monocytes Relative: 2 %
NEUTROS PCT: 95 %
Neutro Abs: 7.1 10*3/uL (ref 1.7–7.7)
PLATELETS: 16 10*3/uL — AB (ref 150–400)
RBC: 2.6 MIL/uL — AB (ref 4.22–5.81)
RDW: 21.4 % — ABNORMAL HIGH (ref 11.5–15.5)
WBC: 7.5 10*3/uL (ref 4.0–10.5)

## 2015-06-14 LAB — COMPREHENSIVE METABOLIC PANEL
ALBUMIN: 3.3 g/dL — AB (ref 3.5–5.0)
ALK PHOS: 47 U/L (ref 38–126)
ALT: 16 U/L — AB (ref 17–63)
AST: 19 U/L (ref 15–41)
Anion gap: 4 — ABNORMAL LOW (ref 5–15)
BUN: 22 mg/dL — ABNORMAL HIGH (ref 6–20)
CHLORIDE: 104 mmol/L (ref 101–111)
CO2: 25 mmol/L (ref 22–32)
CREATININE: 1.06 mg/dL (ref 0.61–1.24)
Calcium: 11.4 mg/dL — ABNORMAL HIGH (ref 8.9–10.3)
GFR calc non Af Amer: >60 mL/min (ref >60)
GLUCOSE: 183 mg/dL — AB (ref 65–99)
Potassium: 4.4 mmol/L (ref 3.5–5.1)
SODIUM: 133 mmol/L — AB (ref 135–145)
Total Bilirubin: 0.7 mg/dL (ref 0.3–1.2)
Total Protein: 5.7 g/dL — ABNORMAL LOW (ref 6.5–8.1)

## 2015-06-14 MED ORDER — SODIUM CHLORIDE 0.9 % IJ SOLN: 3.0000 mL | INTRAMUSCULAR | Status: DC | PRN

## 2015-06-14 MED ORDER — SODIUM CHLORIDE 0.9 % IV SOLN: 250.0000 mL | Freq: Once | INTRAVENOUS | Status: DC

## 2015-06-14 MED ORDER — SODIUM CHLORIDE 0.9 % IJ SOLN: 10.0000 mL | INTRAMUSCULAR | Status: DC | PRN

## 2015-06-14 MED ORDER — HEPARIN SOD (PORK) LOCK FLUSH 100 UNIT/ML IV SOLN: 500.0000 [IU] | Freq: Every day | INTRAVENOUS | Status: DC | PRN

## 2015-06-14 MED ORDER — HEPARIN SOD (PORK) LOCK FLUSH 100 UNIT/ML IV SOLN: 250.0000 [IU] | INTRAVENOUS | Status: DC | PRN

## 2015-06-14 NOTE — Plan of Care (Signed)
Problem: Bowel/Gastric: Goal: Will not experience complications related to bowel motility Outcome: Progressing Scheduled ducolax tabs being given. Declines scheduled lactulose.

## 2015-06-14 NOTE — Plan of Care (Signed)
Problem: Pain Managment: Goal: General experience of comfort will improve Outcome: Progressing PRN morphine IV and prn flexeril effective in managing pain

## 2015-06-14 NOTE — Plan of Care (Signed)
Problem: Activity: Goal: Risk for activity intolerance will decrease Outcome: Progressing Ambulates around unit frequently

## 2015-06-14 NOTE — Progress Notes (Signed)
Mark [name]   DOB:1970-12-21   YV#:[id number]   SPZ#:[id number]  Subjective: Pt is tolerating chem well, no nausea or other complains. VS stable, afebrile, no bleeding. Urine output adequate.    Objective:  Filed Vitals:   06/14/15 0526  BP: 92/44  Pulse: 85  Temp: 98.1 F (36.7 C)  Resp: 16    Body mass index is 25.62 kg/(m^2).  Intake/Output Summary (Last 24 hours) at 06/14/15 0959 Last data filed at 06/13/15 1750  Gross per 24 hour  Intake    480 ml  Output   1675 ml  Net  -1195 ml     Sclerae unicteric  Oropharynx clear  No peripheral adenopathy  Lungs clear -- no rales or rhonchi  Heart regular rate and rhythm  Abdomen benign  MSK no focal spinal tenderness, no peripheral edema  Neuro nonfocal  CBG (last 3)  No results for input(s): GLUCAP in the last 72 hours.   Labs:  Lab Results  Component Value Date   WBC 7.5 06/14/2015   HGB 8.1* 06/14/2015   HCT 23.4* 06/14/2015   MCV 90.0 06/14/2015   PLT 16* 06/14/2015   NEUTROABS 7.1 06/14/2015    _0 @  Urine Studies No results for input(s): UHGB, CRYS in the last 72 hours.  Invalid input(s): UACOL, UAPR, USPG, UPH, UTP, UGL, [place], UBIL, UNIT, UROB, [place], UEPI, [place], [place]  Basic Metabolic Panel:  Recent Labs Lab 06/11/15 0445 06/11/15 1230 06/12/15 0500 06/13/15 0558 06/14/15 0515  NA 137 135 132* 134* 133*  K 5.6* 5.4* 5.1 4.9 4.4  CL 108 107 106 107 104  CO2 23 21* 21* 22 25  GLUCOSE 165* 152* 173* 187* 183*  BUN _1 22*  CREATININE 1.03 1.04 1.03 1.04 1.06  CALCIUM 12.0* 11.9* 11.5* 12.3* 11.4*   GFR Estimated Creatinine Clearance: 96.7 mL/min (by C-G formula based on Cr of 1.06). Liver Function Tests:  Recent Labs Lab 06/10/15 0510 06/11/15 0445 06/12/15 0500 06/13/15 0558 06/14/15 0515  AST 27 38 _2 ALT _3 16*  ALKPHOS 59 65 53 51 47  BILITOT 1.9* 1.5* 1.0 0.8 0.7  PROT 6.6 7.1 6.3* 6.4* 5.7*  ALBUMIN 4.1 4.2 3.8 3.8  3.3*   No results for input(s): LIPASE, AMYLASE in the last 168 hours. No results for input(s): AMMONIA in the last 168 hours. Coagulation profile No results for input(s): INR, PROTIME in the last 168 hours.  CBC:  Recent Labs Lab 06/09/15 0814 06/10/15 0510 06/11/15 0445 06/12/15 0500 06/13/15 0558 06/14/15 0515  WBC 8.2 5.4 8.6 9.9 11.0* 7.5  NEUTROABS 4.5  --  6.2 8.6* 10.2* 7.1  HGB 11.6* 10.0* 10.2* 8.9* 8.6* 8.1*  HCT 34.5* 30.7* 31.0* 26.4* 25.6* 23.4*  MCV 90 92.7 90.9 90.4 89.2 90.0  PLT 15* 57* 49* 34* 23* 16*   Cardiac Enzymes: No results for input(s): CKTOTAL, CKMB, CKMBINDEX, TROPONINI in the last 168 hours. BNP: Invalid input(s): POCBNP CBG: No results for input(s): GLUCAP in the last 168 hours. D-Dimer No results for input(s): DDIMER in the last 72 hours. Hgb A1c No results for input(s): HGBA1C in the last 72 hours. Lipid Profile No results for input(s): CHOL, HDL, LDLCALC, TRIG, CHOLHDL, LDLDIRECT in the last 72 hours. Thyroid function studies No results for input(s): TSH, T4TOTAL, T3FREE, THYROIDAB in the last 72 hours.  Invalid input(s): FREET3 Anemia work up No results for input(s): VITAMINB12, FOLATE, FERRITIN, TIBC, IRON, RETICCTPCT in  the last 72 hours. Microbiology No results found for this or any previous visit (from the past 240 hour(s)).    Studies:  No results found.  Assessment: 44 y.o. with refractory multiple myeloma, was admitted for chemotherapy VD-PACE  1. Refractory multiple myeloma 2. Hypercalcemia, secondary to #1, calcium trending down to 11.4 today 3. Anemia Hb 8.1 today, slightly worse, may need blood transfusion in the next few days  4. Worsening thrombocytopenia secondary to chemo, platelets 16 today, need transfusion today     Plan:  -continue chemo. -1 unit phresed, irridiated and leuko reduced platelet transfusion today  -monitor CBC daily -he received Aredia yesterday, calcium trending down  -continue IVF  hydration, and monitoring urine out put  -Dr. Martha Clan will resume care tomorrow    Truitt Merle, MD 06/14/2015  9:59 AM  [phone]

## 2015-06-15 ENCOUNTER — Inpatient Hospital Stay (HOSPITAL_COMMUNITY): Payer: BLUE CROSS/BLUE SHIELD

## 2015-06-15 LAB — COMPREHENSIVE METABOLIC PANEL
ALT: 15 U/L — ABNORMAL LOW (ref 17–63)
AST: 16 U/L (ref 15–41)
Albumin: 3.1 g/dL — ABNORMAL LOW (ref 3.5–5.0)
Alkaline Phosphatase: 52 U/L (ref 38–126)
Anion gap: 6 (ref 5–15)
BILIRUBIN TOTAL: 0.7 mg/dL (ref 0.3–1.2)
BUN: 26 mg/dL — AB (ref 6–20)
CALCIUM: 10.2 mg/dL (ref 8.9–10.3)
CO2: 27 mmol/L (ref 22–32)
CREATININE: 1.1 mg/dL (ref 0.61–1.24)
Chloride: 104 mmol/L (ref 101–111)
GLUCOSE: 246 mg/dL — AB (ref 65–99)
Potassium: 3.5 mmol/L (ref 3.5–5.1)
Sodium: 137 mmol/L (ref 135–145)
Total Protein: 5.4 g/dL — ABNORMAL LOW (ref 6.5–8.1)

## 2015-06-15 LAB — CBC WITH DIFFERENTIAL/PLATELET
Basophils Absolute: 0 10*3/uL (ref 0.0–0.1)
Basophils Relative: 0 %
EOS ABS: 0 10*3/uL (ref 0.0–0.7)
EOS PCT: 0 %
HEMATOCRIT: 22.3 % — AB (ref 39.0–52.0)
Hemoglobin: 7.5 g/dL — ABNORMAL LOW (ref 13.0–17.0)
LYMPHS ABS: 0.1 10*3/uL — AB (ref 0.7–4.0)
Lymphocytes Relative: 4 %
MCH: 29.8 pg (ref 26.0–34.0)
MCHC: 33.6 g/dL (ref 30.0–36.0)
MCV: 88.5 fL (ref 78.0–100.0)
Monocytes Absolute: 0 10*3/uL — ABNORMAL LOW (ref 0.1–1.0)
Monocytes Relative: 1 %
NEUTROS PCT: 95 %
Neutro Abs: 2.6 10*3/uL (ref 1.7–7.7)
PLATELETS: 36 10*3/uL — AB (ref 150–400)
RBC: 2.52 MIL/uL — AB (ref 4.22–5.81)
RDW: 21.3 % — AB (ref 11.5–15.5)
WBC: 2.7 10*3/uL — AB (ref 4.0–10.5)

## 2015-06-15 LAB — PREPARE PLATELET PHERESIS: Unit division: 0

## 2015-06-15 LAB — PREPARE RBC (CROSSMATCH)

## 2015-06-15 MED ORDER — SODIUM CHLORIDE 0.9 % IV SOLN: Freq: Once | INTRAVENOUS | Status: DC

## 2015-06-15 MED ORDER — SODIUM CHLORIDE 0.9 % IV SOLN
8.0000 mg | Freq: Three times a day (TID) | INTRAVENOUS | Status: DC
  Administered 2015-06-15 (×3): 8 mg via INTRAVENOUS
  Filled 2015-06-15 (×6): qty 4

## 2015-06-15 MED ORDER — GADOBENATE DIMEGLUMINE 529 MG/ML IV SOLN
20.0000 mL | Freq: Once | INTRAVENOUS | Status: AC | PRN
  Administered 2015-06-15: 16 mL via INTRAVENOUS

## 2015-06-15 MED ORDER — SODIUM CHLORIDE 0.9 % IV SOLN
150.0000 mg | Freq: Once | INTRAVENOUS | Status: AC
  Administered 2015-06-15: 150 mg via INTRAVENOUS
  Filled 2015-06-15: qty 5

## 2015-06-15 MED ORDER — ACETAMINOPHEN 325 MG PO TABS
650.0000 mg | ORAL_TABLET | Freq: Once | ORAL | Status: AC
  Administered 2015-06-16: 650 mg via ORAL

## 2015-06-15 MED ORDER — FUROSEMIDE 10 MG/ML IJ SOLN
40.0000 mg | Freq: Once | INTRAMUSCULAR | Status: AC
  Administered 2015-06-15: 40 mg via INTRAVENOUS
  Filled 2015-06-15: qty 4

## 2015-06-15 NOTE — Progress Notes (Signed)
[name] has finished up his chemotherapy. I think faces up on Sunday. Hopefully, he will have his MRIs today.  I appreciate the great care that he got from Dr. Burr Medico over the weekend. She gave a dose of Aredia. His calcium seem to be going up a little bit. He had a calcium level of 10.2 today.  His hemoglobin is 7.5. He will definitely need a transfusion.  Pain continues to be somewhat of an issue. He is on a fentanyl patch. He is getting the IV morphine. He is on gabapentin. We will see what the MRIs show.  He still has a little nausea. I'll put him on some Zofran today. I'll give him a dose of Emend.  He's had no diarrhea. He's had no leg swelling. He's had no rashes. He's had no headache.  He is up walking around quite a bit.  [name], his vital signs were all stable. His blood pressure is a little bit low at 88/57. His pulse is 106. Temperature 98.  Lungs are clear.  No mucositis.  Abdomen is soft.  Cardiac exam is RRR.  No neurological changes.  He should be ready for discharge in the AM.  Mark [name] 29:11

## 2015-06-15 NOTE — Progress Notes (Signed)
Pt has a temp of 100.2 paged oncall x2 still awaiting for orders.

## 2015-06-16 ENCOUNTER — Other Ambulatory Visit: Payer: Self-pay | Admitting: Hematology & Oncology

## 2015-06-16 ENCOUNTER — Ambulatory Visit: Payer: BLUE CROSS/BLUE SHIELD

## 2015-06-16 ENCOUNTER — Other Ambulatory Visit: Payer: BLUE CROSS/BLUE SHIELD

## 2015-06-16 ENCOUNTER — Ambulatory Visit: Payer: BLUE CROSS/BLUE SHIELD | Admitting: Hematology & Oncology

## 2015-06-16 DIAGNOSIS — C9002 Multiple myeloma in relapse: Secondary | ICD-10-CM

## 2015-06-16 DIAGNOSIS — M8448XS Pathological fracture, other site, sequela: Secondary | ICD-10-CM

## 2015-06-16 DIAGNOSIS — D61818 Other pancytopenia: Secondary | ICD-10-CM

## 2015-06-16 LAB — CBC WITH DIFFERENTIAL/PLATELET
BASOS ABS: 0 10*3/uL (ref 0.0–0.1)
BASOS PCT: 0 %
EOS PCT: 1 %
Eosinophils Absolute: 0 10*3/uL (ref 0.0–0.7)
HEMATOCRIT: 30.1 % — AB (ref 39.0–52.0)
HEMOGLOBIN: 10.3 g/dL — AB (ref 13.0–17.0)
LYMPHS ABS: 0.1 10*3/uL — AB (ref 0.7–4.0)
LYMPHS PCT: 16 %
MCH: 29.8 pg (ref 26.0–34.0)
MCHC: 34.2 g/dL (ref 30.0–36.0)
MCV: 87 fL (ref 78.0–100.0)
MONOS PCT: 4 %
Monocytes Absolute: 0 10*3/uL — ABNORMAL LOW (ref 0.1–1.0)
NEUTROS ABS: 0.7 10*3/uL — AB (ref 1.7–7.7)
Neutrophils Relative %: 79 %
Platelets: 24 10*3/uL — CL (ref 150–400)
RBC: 3.46 MIL/uL — ABNORMAL LOW (ref 4.22–5.81)
RDW: 19.2 % — ABNORMAL HIGH (ref 11.5–15.5)
WBC: 0.8 10*3/uL — CL (ref 4.0–10.5)

## 2015-06-16 LAB — COMPREHENSIVE METABOLIC PANEL
ALK PHOS: 50 U/L (ref 38–126)
ALT: 15 U/L — AB (ref 17–63)
AST: 18 U/L (ref 15–41)
Albumin: 3.1 g/dL — ABNORMAL LOW (ref 3.5–5.0)
Anion gap: 8 (ref 5–15)
BILIRUBIN TOTAL: 1.4 mg/dL — AB (ref 0.3–1.2)
BUN: 24 mg/dL — AB (ref 6–20)
CALCIUM: 10.8 mg/dL — AB (ref 8.9–10.3)
CHLORIDE: 96 mmol/L — AB (ref 101–111)
CO2: 30 mmol/L (ref 22–32)
CREATININE: 1 mg/dL (ref 0.61–1.24)
Glucose, Bld: 124 mg/dL — ABNORMAL HIGH (ref 65–99)
Potassium: 3.3 mmol/L — ABNORMAL LOW (ref 3.5–5.1)
Sodium: 134 mmol/L — ABNORMAL LOW (ref 135–145)
Total Protein: 5.6 g/dL — ABNORMAL LOW (ref 6.5–8.1)

## 2015-06-16 LAB — TYPE AND SCREEN
ABO/RH(D): B POS
Antibody Screen: POSITIVE
DAT, IgG: NEGATIVE
UNIT DIVISION: 0
UNIT DIVISION: 0

## 2015-06-16 MED ORDER — BIOTENE DRY MOUTH MT LIQD: 15.0000 mL | Freq: Four times a day (QID) | OROMUCOSAL | Status: DC

## 2015-06-16 MED ORDER — LEVOFLOXACIN 500 MG PO TABS: 500.0000 mg | ORAL_TABLET | Freq: Every day | ORAL | Status: DC

## 2015-06-16 MED ORDER — LEVOFLOXACIN 500 MG PO TABS
500.0000 mg | ORAL_TABLET | Freq: Every day | ORAL | Status: DC
  Administered 2015-06-16: 500 mg via ORAL
  Filled 2015-06-16 (×2): qty 1

## 2015-06-16 MED ORDER — CYCLOBENZAPRINE HCL 10 MG PO TABS: 10.0000 mg | ORAL_TABLET | Freq: Three times a day (TID) | ORAL | Status: AC | PRN

## 2015-06-16 MED ORDER — ACETAMINOPHEN 325 MG PO TABS
650.0000 mg | ORAL_TABLET | Freq: Four times a day (QID) | ORAL | Status: DC | PRN
  Administered 2015-06-16: 650 mg via ORAL
  Filled 2015-06-16: qty 2

## 2015-06-16 MED ORDER — HEPARIN SOD (PORK) LOCK FLUSH 100 UNIT/ML IV SOLN
500.0000 [IU] | Freq: Once | INTRAVENOUS | Status: AC
  Administered 2015-06-16: 500 [IU] via INTRAVENOUS
  Filled 2015-06-16: qty 5

## 2015-06-16 NOTE — Discharge Summary (Signed)
Discharge Summary dictated:  # [name] is D/C summary  Mark [name]

## 2015-06-16 NOTE — Discharge Summary (Signed)
Mark [name], Mark [name]                 ACCOUNT NO.:  [id number]  MEDICAL RECORD NO.:  [id number]  LOCATION:  [id number]                         FACILITY:  [facility]  PHYSICIAN:  [name], M.D.  DATE OF BIRTH:  [birth year]  DATE OF ADMISSION:  06/09/2015 DATE OF DISCHARGE:                              DISCHARGE SUMMARY   DISCHARGE DIAGNOSES: 1. Refractory kappa light chain myeloma. 2. Status post chemotherapy with VD-PACE. 3. Recurrent hypercalcemia. 4. Pancytopenia secondary to myeloma. 5. Pathologic fracture of C5 vertebral body.  CONDITION ON DISCHARGE:  Stable.  ACTIVITIES:  As tolerated.  DIET:  Without restrictions.  FOLLOWUP:  The patient will come to the [facility] on June 17, 2015, for lab work and Neulasta injection.  MEDICATIONS UPON DISCHARGE: 1. Biotene mouth rinse 15 mL q.i.d. 2. Flexeril 10 mg p.o. t.i.d. p.r.n. muscle spasm. 3. Levaquin 500 mg p.o. daily. 4. Dulcolax 5 to 10 mg p.o. b.i.d. p.r.n. 5. Klonopin 0.5 mg p.o. t.i.d. p.r.n. 6. Famvir 500 mg p.o. daily. 7. Fentanyl patch 100 mcg to the skin every 48 hours. 8. Diflucan 100 mg p.o. daily. 9. Neurontin 600 mg p.o. t.i.d. 10.Dilaudid 4 to 8 mg p.o. q.4 hours p.r.n. breakthrough pain. 11.Ativan 0.1 mg p.o. q.6 hours p.r.n. nausea and vomiting. 12.Reglan 10 mg p.o. q.6 hours p.r.n. nausea. 13.Zofran 8 mg p.o. q.8 hours p.r.n. nausea. 14.Protonix 40 mg p.o. daily. 15.Compazine 10 mg p.o. q.6 hours p.r.n. nausea and vomiting. 16.Restoril 22.5 mg p.o. q.h.s. 17.Florastor 250 mg p.o. b.i.d.  HOSPITAL COURSE:  Mark [name] was admitted on June 09, 2015 from our office.  He had recurrent hypercalcemia.  This is a clear indicator of his kappa light chain myeloma worsening.  He is trying to get ready for an allogeneic transplant, but he just cannot get into a good remission for this.  He was admitted.  His Port-A-Cath was already accessed.  He did have a PICC line placed when he was in the  hospital.  He was given IV fluids. He was given calcitonin under the skin.  His calcium did respond.  We subsequently started him on chemotherapy.  His initial bilirubin was up just a little bit, so we had to make a dosage reduction of chemotherapy.  We did get a chest x-ray on him.  This showed that he had a growing nodule in the left lung periphery, measuring 4.4 x 4.3 cm.  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  His IV fluids were started at 100 mL/hour.  He really did not have much in the way of nausea and vomiting.  His appetite was pretty good.  He was ambulating fairly well.  He had a body surface area of 2.04 m2.  He was started on treatment on June 10, 2015.  He received Velcade 2.1 mg.  He got IV Decadron at 40 mg daily for 4 days.  He received Adriamycin at 16 mg for 4 days.  He also was given Cytoxan and cisplatin, the dose of cisplatin was 21 mg daily, dose of Cytoxan was 800 mg daily.  He tolerated this pretty well. He completed treatment on June 14, 2015.  He was  given platelets while in the hospital.  He also received 2 units of blood while in the hospital.  He tolerated these well.  The blood he got on June 15, 2015.  He did have a little bit of a temperature with the blood transfusion.  We did go ahead and get an MRI of his neck and lumbar spine.  He has complaints of more neck pain.  Unfortunately, he was noted to have a pathologic fracture at C5.  He had diffuse enhancement of the vertebral bodies with some epidural soft tissue noted.  I am sure this is all from his myeloma.  His lumbar spine MRI also showed some progression of disease.  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  There was an unchanged pathologic fracture at T12.  He has some chronic degenerative changes.  I will speak with Radiation Oncology to see if they might be able to help with radiation to the cervical spine and maybe lumbar spine.  He has already received  some radiation to the lumbar spine.  He has some nausea on June 15, 2015.  He got the Zofran around the clock and a dose of Emend.  This helped quite a bit.  On the day of discharge, his labs showed a white cell count of 0.8, hemoglobin 10.3, platelet count 24,000.  Sodium was 134, potassium 3.3, BUN 24 and creatinine 1.0, calcium 10.8 with an albumin of 3.1.  His bilirubin was 1.4.  DISCHARGE PHYSICAL EXAMINATION:  VITAL SIGNS:  His vital signs showed temperature of 99, pulse 116, blood pressure 83/40. HEAD AND NECK:  No ocular or oral lesions.  He has no palpable cervical or supraclavicular lymph nodes. LUNGS:  Clear bilaterally.  There are no rales, wheezes, or rhonchi. CARDIAC:  Slightly tachycardic, but regular.  He had no murmurs, rubs, or bruits. ABDOMEN:  Soft.  He has good bowel sounds.  There is no fluid wave. There is no palpable liver or spleen tip. EXTREMITIES:  No clubbing, cyanosis, or edema. SKIN:  No rashes, ecchymosis, or petechiae. NEUROLOGIC:  No focal neurological deficits.  He felt like he is ready to go home.  His blood pressure was a little bit on the low side, but he has been asymptomatic with this.  He is not on any kind of blood pressure medication.  He will come to the office on June 17, 2015 for Neulasta.  I will also check some lab work on him.  He and I have talked during the hospital admission about the difficulties that he is having with respect to responding to treatment. He has been through quite a few courses of therapy.  He seems to have a transient response and then the myeloma progresses quickly.  It is certainly possible and he realizes this that he may never be able to get to an allogeneic transplant.  His birthday is coming up next week.  Hopefully, he will be able to be home to enjoy his birthday.     [name], M.D.     PRE/MEDQ  D:  06/16/2015  T:  06/16/2015  Job:  [id number]

## 2015-06-17 ENCOUNTER — Other Ambulatory Visit: Payer: BLUE CROSS/BLUE SHIELD

## 2015-06-17 ENCOUNTER — Ambulatory Visit: Payer: BLUE CROSS/BLUE SHIELD | Admitting: Family

## 2015-06-17 ENCOUNTER — Telehealth: Payer: Self-pay | Admitting: *Deleted

## 2015-06-17 NOTE — Telephone Encounter (Signed)
Patient doesn't want to come in today. He doesn't feel well. Despite encouragement to come into the office today for assessment, the patient wants to be seen tomorrow. Appointments rescheduled for tomorrow.

## 2015-06-18 ENCOUNTER — Ambulatory Visit (HOSPITAL_BASED_OUTPATIENT_CLINIC_OR_DEPARTMENT_OTHER): Payer: BLUE CROSS/BLUE SHIELD

## 2015-06-18 ENCOUNTER — Ambulatory Visit (HOSPITAL_BASED_OUTPATIENT_CLINIC_OR_DEPARTMENT_OTHER): Payer: BLUE CROSS/BLUE SHIELD | Admitting: Family

## 2015-06-18 ENCOUNTER — Other Ambulatory Visit (HOSPITAL_BASED_OUTPATIENT_CLINIC_OR_DEPARTMENT_OTHER): Payer: BLUE CROSS/BLUE SHIELD

## 2015-06-18 ENCOUNTER — Ambulatory Visit (HOSPITAL_COMMUNITY)
Admission: RE | Admit: 2015-06-18 | Discharge: 2015-06-18 | Disposition: A | Discharge status: Home / Self Care | Payer: BLUE CROSS/BLUE SHIELD | Source: Ambulatory Visit | Attending: Hematology & Oncology | Admitting: Hematology & Oncology

## 2015-06-18 VITALS — BP 117/76 | HR 125 | Temp 99.6°F | Resp 20

## 2015-06-18 DIAGNOSIS — M549 Dorsalgia, unspecified: Secondary | ICD-10-CM

## 2015-06-18 DIAGNOSIS — Z5189 Encounter for other specified aftercare: Secondary | ICD-10-CM | POA: Diagnosis not present

## 2015-06-18 DIAGNOSIS — C9002 Multiple myeloma in relapse: Secondary | ICD-10-CM

## 2015-06-18 DIAGNOSIS — C9000 Multiple myeloma not having achieved remission: Secondary | ICD-10-CM

## 2015-06-18 LAB — CBC WITH DIFFERENTIAL (CANCER CENTER ONLY)
HCT: 30.7 % — ABNORMAL LOW (ref 38.7–49.9)
HGB: 10.7 g/dL — ABNORMAL LOW (ref 13.0–17.1)
MCH: 29.8 pg (ref 28.0–33.4)
MCHC: 34.9 g/dL (ref 32.0–35.9)
MCV: 86 fL (ref 82–98)
Platelets: 8 10e3/uL — CL (ref 145–400)
RBC: 3.59 10e6/uL — ABNORMAL LOW (ref 4.20–5.70)
RDW: 18.1 % — ABNORMAL HIGH (ref 11.1–15.7)
WBC: <0.2 10e3/uL — CL (ref 4.0–10.0)

## 2015-06-18 LAB — CMP (CANCER CENTER ONLY)
ALT(SGPT): 15 U/L (ref 10–47)
AST: 25 U/L (ref 11–38)
Albumin: 2.9 g/dL — ABNORMAL LOW (ref 3.3–5.5)
Alkaline Phosphatase: 50 U/L (ref 26–84)
BUN, Bld: 15 mg/dL (ref 7–22)
CO2: 31 meq/L (ref 18–33)
Calcium: 11.7 mg/dL — ABNORMAL HIGH (ref 8.0–10.3)
Chloride: 99 meq/L (ref 98–108)
Creat: 0.8 mg/dL (ref 0.6–1.2)
Glucose, Bld: 121 mg/dL — ABNORMAL HIGH (ref 73–118)
Potassium: 3.9 meq/L (ref 3.3–4.7)
Sodium: 138 meq/L (ref 128–145)
Total Bilirubin: 0.8 mg/dL (ref 0.20–1.60)
Total Protein: 6.6 g/dL (ref 6.4–8.1)

## 2015-06-18 LAB — TECHNOLOGIST REVIEW CHCC SATELLITE

## 2015-06-18 LAB — LACTATE DEHYDROGENASE (CC13): LDH: 207 U/L (ref 125–245)

## 2015-06-18 MED ORDER — PEGFILGRASTIM INJECTION 6 MG/0.6ML ~~LOC~~
PREFILLED_SYRINGE | SUBCUTANEOUS | Status: AC
  Filled 2015-06-18: qty 0.6

## 2015-06-18 MED ORDER — PEGFILGRASTIM INJECTION 6 MG/0.6ML ~~LOC~~
6.0000 mg | PREFILLED_SYRINGE | Freq: Once | SUBCUTANEOUS | Status: AC
  Administered 2015-06-18: 6 mg via SUBCUTANEOUS

## 2015-06-18 MED ORDER — SODIUM CHLORIDE 0.9 % IJ SOLN
10.0000 mL | INTRAMUSCULAR | Status: DC | PRN
  Administered 2015-06-18: 10 mL via INTRAVENOUS
  Filled 2015-06-18: qty 10

## 2015-06-18 MED ORDER — HEPARIN SOD (PORK) LOCK FLUSH 100 UNIT/ML IV SOLN
500.0000 [IU] | Freq: Once | INTRAVENOUS | Status: AC
  Administered 2015-06-18: 500 [IU] via INTRAVENOUS
  Filled 2015-06-18: qty 5

## 2015-06-18 NOTE — Patient Instructions (Signed)
Pegfilgrastim injection What is this medicine? PEGFILGRASTIM (PEG fil gra stim) is a long-acting granulocyte colony-stimulating factor that stimulates the growth of neutrophils, a type of white blood cell important in the body's fight against infection. It is used to reduce the incidence of fever and infection in patients with certain types of cancer who are receiving chemotherapy that affects the bone marrow, and to increase survival after being exposed to high doses of radiation. This medicine may be used for other purposes; ask your health care provider or pharmacist if you have questions. What should I tell my health care provider before I take this medicine? They need to know if you have any of these conditions: -kidney disease -latex allergy -ongoing radiation therapy -sickle cell disease -skin reactions to acrylic adhesives (On-Body Injector only) -an unusual or allergic reaction to pegfilgrastim, filgrastim, other medicines, foods, dyes, or preservatives -pregnant or trying to get pregnant -breast-feeding How should I use this medicine? This medicine is for injection under the skin. If you get this medicine at home, you will be taught how to prepare and give the pre-filled syringe or how to use the On-body Injector. Refer to the patient Instructions for Use for detailed instructions. Use exactly as directed. Take your medicine at regular intervals. Do not take your medicine more often than directed. It is important that you put your used needles and syringes in a special sharps container. Do not put them in a trash can. If you do not have a sharps container, call your pharmacist or healthcare provider to get one. Talk to your pediatrician regarding the use of this medicine in children. While this drug may be prescribed for selected conditions, precautions do apply. Overdosage: If you think you have taken too much of this medicine contact a poison control center or emergency room at  once. NOTE: This medicine is only for you. Do not share this medicine with others. What if I miss a dose? It is important not to miss your dose. Call your doctor or health care professional if you miss your dose. If you miss a dose due to an On-body Injector failure or leakage, a new dose should be administered as soon as possible using a single prefilled syringe for manual use. What may interact with this medicine? Interactions have not been studied. Give your health care provider a list of all the medicines, herbs, non-prescription drugs, or dietary supplements you use. Also tell them if you smoke, drink alcohol, or use illegal drugs. Some items may interact with your medicine. This list may not describe all possible interactions. Give your health care provider a list of all the medicines, herbs, non-prescription drugs, or dietary supplements you use. Also tell them if you smoke, drink alcohol, or use illegal drugs. Some items may interact with your medicine. What should I watch for while using this medicine? You may need blood work done while you are taking this medicine. If you are going to need a MRI, CT scan, or other procedure, tell your doctor that you are using this medicine (On-Body Injector only). What side effects may I notice from receiving this medicine? Side effects that you should report to your doctor or health care professional as soon as possible: -allergic reactions like skin rash, itching or hives, swelling of the face, lips, or tongue -dizziness -fever -pain, redness, or irritation at site where injected -pinpoint red spots on the skin -red or dark-brown urine -shortness of breath or breathing problems -stomach or side pain, or pain   at the shoulder -swelling -tiredness -trouble passing urine or change in the amount of urine Side effects that usually do not require medical attention (report to your doctor or health care professional if they continue or are  bothersome): -bone pain -muscle pain This list may not describe all possible side effects. Call your doctor for medical advice about side effects. You may report side effects to FDA at 1-800-FDA-1088. Where should I keep my medicine? Keep out of the reach of children. Store pre-filled syringes in a refrigerator between 2 and 8 degrees C (36 and 46 degrees F). Do not freeze. Keep in carton to protect from light. Throw away this medicine if it is left out of the refrigerator for more than 48 hours. Throw away any unused medicine after the expiration date. NOTE: This sheet is a summary. It may not cover all possible information. If you have questions about this medicine, talk to your doctor, pharmacist, or health care provider.    2016, [employer]. (2014-08-07 14:30:14)  

## 2015-06-18 NOTE — Telephone Encounter (Signed)
Error

## 2015-06-18 NOTE — Progress Notes (Signed)
Hematology and Oncology Follow Up Visit  Mark [name] [id number] 1971/02/11 44 y.o. 06/18/2015   Principle Diagnosis:  Relapsed kappa light chain myeloma - July 2016 - transplant at [facility] in February 2015  Recurrent hypercalcemia   Current Therapy:   SALVAGE VD-PACE q 21 days s/p cycle 1 Zometa q 28 days     Interim History:  Mark [name] is here today for a follow-up. He was admitted to [facility] from our office week before last with hypercalcemia. His calcium today is 11.7. His myeloma studies are pending.  He received blood and platelets during admission.  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 He has verbalized understanding that he may not achieve remission but we are moving forward with treatment.  He is now back on SALVAGE VD-PACE and received cycle 1 while in the hospital. He tolerated this well. He denies having any n/v, fever, chills or changes in bowel or bladder habits. No rash or SOB.  He still has had some constipation that is relieved with stool softeners.  His platelet count today is 8. He has had no episodes of bleeding or bruising. His Hgb is holding at 10.7.  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 This is causing him a great deal of discomfort. He filled his prescription for flexeril today and plans to start it this evening. He would also like a neck brace for some extra support. He will start radiation to the cervical spine with Dr. Sondra Come on November 23rd.  He has had some worsening neuropathy in his feet this week. No swelling in his extremities.  His appetite is decreased but he is still trying to eat. He is drinking fluids. His weight is down 2 lbs since his last visit.   Medications:    Medication List       This list is accurate as of: 06/18/15 12:19 PM.  Always use your most recent med list.               antiseptic oral rinse Liqd    15 mLs by Mouth Rinse route QID.     bisacodyl 5 MG EC tablet  Commonly known as:  DULCOLAX  Take 5 mg by mouth daily as needed for moderate constipation (constipation).     clonazePAM 0.5 MG tablet  Commonly known as:  KLONOPIN  Take 1 tablet (0.5 mg total) by mouth 3 (three) times daily as needed for anxiety.     cyclobenzaprine 10 MG tablet  Commonly known as:  FLEXERIL  Take 1 tablet (10 mg total) by mouth 3 (three) times daily as needed for muscle spasms.     famciclovir 500 MG tablet  Commonly known as:  FAMVIR  Take 1 tablet (500 mg total) by mouth daily.     fentaNYL 100 MCG/HR  Commonly known as:  DURAGESIC - dosed mcg/hr  Place 1 patch (100 mcg total) onto the skin every other day.     fluconazole 100 MG tablet  Commonly known as:  DIFLUCAN  Take 1 tablet (100 mg total) by mouth daily.     gabapentin 300 MG capsule  Commonly known as:  NEURONTIN  Take 2 capsules (600 mg total) by mouth 3 (three) times daily.     HYDROmorphone 4 MG tablet  Commonly known as:  DILAUDID  Take 4-8 mg by mouth every 4 (four) hours as needed for  moderate pain or severe pain.     Ipratropium-Albuterol 20-100 MCG/ACT Aers respimat  Commonly known as:  COMBIVENT  Inhale 1 puff into the lungs every 6 (six) hours.     levofloxacin 500 MG tablet  Commonly known as:  LEVAQUIN  Take 1 tablet (500 mg total) by mouth daily.     lidocaine-prilocaine cream  Commonly known as:  EMLA  Apply to affected area once     LORazepam 0.5 MG tablet  Commonly known as:  ATIVAN  Take 1 tablet (0.5 mg total) by mouth every 6 (six) hours as needed (Nausea or vomiting).     metoCLOPramide 10 MG tablet  Commonly known as:  REGLAN  Take 1 tablet (10 mg total) by mouth every 6 (six) hours as needed for nausea or vomiting.     ondansetron 8 MG disintegrating tablet  Commonly known as:  ZOFRAN ODT  Take 1 tablet (8 mg total) by mouth every 8 (eight) hours as needed for nausea or vomiting.      pantoprazole 40 MG tablet  Commonly known as:  PROTONIX  Take 1 tablet (40 mg total) by mouth daily.     prochlorperazine 10 MG tablet  Commonly known as:  COMPAZINE  Take 1 tablet (10 mg total) by mouth every 6 (six) hours as needed (Nausea or vomiting).     saccharomyces boulardii 250 MG capsule  Commonly known as:  FLORASTOR  Take 1 capsule (250 mg total) by mouth 2 (two) times daily.     temazepam 22.5 MG capsule  Commonly known as:  RESTORIL  Take 1 capsule (22.5 mg total) by mouth at bedtime as needed for sleep.        Allergies: No Known Allergies  Past Medical History, Surgical history, Social history, and Family History were reviewed and updated.  Review of Systems: All other 10 point review of systems is negative.   Physical Exam:  oral temperature is 99.6 F (37.6 C). His blood pressure is 117/76 and his pulse is 125. His respiration is 20.   Wt Readings from Last 3 Encounters:  06/16/15 198 lb 14.4 oz (90.22 kg)  05/12/15 197 lb (89.359 kg)  04/17/15 209 lb (94.802 kg)    Ocular: Sclerae unicteric, pupils equal, round and reactive to light Ear-nose-throat: Oropharynx clear, dentition fair Lymphatic: No cervical or supraclavicular adenopathy Lungs no rales or rhonchi, good excursion bilaterally Heart regular rate and rhythm, no murmur appreciated Abd soft, nontender, positive bowel sounds MSK no focal spinal tenderness, no joint edema Neuro: non-focal, well-oriented, appropriate affect Breasts: Deferred  Lab Results  Component Value Date   WBC 0.8* 06/16/2015   HGB 10.3* 06/16/2015   HCT 30.1* 06/16/2015   MCV 87.0 06/16/2015   PLT 24* 06/16/2015   No results found for: FERRITIN, IRON, TIBC, UIBC, IRONPCTSAT Lab Results  Component Value Date   RBC 3.46* 06/16/2015   Lab Results  Component Value Date   KPAFRELGTCHN 930.00* 06/10/2015   LAMBDASER 2.00* 06/10/2015   KAPLAMBRATIO 5048.08* 06/10/2015   Lab Results  Component Value Date    IGGSERUM 351* 05/19/2015   IGA 7* 05/19/2015   IGMSERUM <5* 05/19/2015   Lab Results  Component Value Date   TOTALPROTELP 6.3 05/19/2015   ALBUMINELP 3.8 05/19/2015   A1GS 0.5* 05/19/2015   A2GS 1.0* 05/19/2015   BETS 0.3* 05/19/2015   BETA2SER 0.4 05/19/2015   GAMS 0.4* 05/19/2015   MSPIKE 0.23 09/22/2014   SPEI * 05/19/2015     Chemistry  Component Value Date/Time   NA 134* 06/16/2015 0550   NA 140 06/09/2015 0814   NA 143 05/29/2015 0959   K 3.3* 06/16/2015 0550   K 4.2 06/09/2015 0814   K 3.5 05/29/2015 0959   CL 96* 06/16/2015 0550   CL 102 06/09/2015 0814   CO2 30 06/16/2015 0550   CO2 26 06/09/2015 0814   CO2 32* 05/29/2015 0959   BUN 24* 06/16/2015 0550   BUN 15 06/09/2015 0814   BUN 11.6 05/29/2015 0959   CREATININE 1.00 06/16/2015 0550   CREATININE 1.2 06/09/2015 0814   CREATININE 1.0 05/29/2015 0959      Component Value Date/Time   CALCIUM 10.8* 06/16/2015 0550   CALCIUM 12.8* 06/09/2015 0814   CALCIUM 10.4 05/29/2015 0959   CALCIUM >15.0* 02/01/2013 1230   ALKPHOS 50 06/16/2015 0550   ALKPHOS 59 06/09/2015 0814   ALKPHOS 59 05/29/2015 0959   AST 18 06/16/2015 0550   AST 29 06/09/2015 0814   AST 25 05/29/2015 0959   ALT 15* 06/16/2015 0550   ALT 20 06/09/2015 0814   ALT 34 05/29/2015 0959   BILITOT 1.4* 06/16/2015 0550   BILITOT 1.70* 06/09/2015 0814   BILITOT 0.98 05/29/2015 0959     Impression and Plan: Mark [name] is 44 year old African American male with relapsed kappa light chain myeloma and recent hospitalization with hypercalcemia. He did undergo stem cell transplant for his myeloma on September 11, 2013 at [facility]. He was to have an allogenic transplant but with his relapse this has been put on hold.  He is now back on SALVAGE VD-PACE and complete cycle 1 while in the hospital. He tolerated this well.  His calcium level today is 11.7. We will continue to monitor this.  He does have a pathologic fracture at C5. He will start  flexeril today. I also gave him a prescription for a soft neck brace to help give him some support. He will start radiation to the cervical spine on November 23rd.  His platelet count today is 8. We will give him one unit of platelets tomorrow.  We will check labs and follow-up with him weekly for now. His lab work will determine whether he is treated in our office or the hospital.  He knows to call with any questions or concerns. We can always see him sooner if need be.   Mark Bottom, NP 11/17/201612:19 PM

## 2015-06-19 ENCOUNTER — Ambulatory Visit (HOSPITAL_BASED_OUTPATIENT_CLINIC_OR_DEPARTMENT_OTHER): Payer: BLUE CROSS/BLUE SHIELD

## 2015-06-19 VITALS — BP 106/66 | HR 127 | Temp 99.1°F | Resp 16

## 2015-06-19 DIAGNOSIS — C9002 Multiple myeloma in relapse: Secondary | ICD-10-CM

## 2015-06-19 MED ORDER — FUROSEMIDE 10 MG/ML IJ SOLN: 20.0000 mg | Freq: Once | INTRAMUSCULAR | Status: DC

## 2015-06-19 MED ORDER — HEPARIN SOD (PORK) LOCK FLUSH 100 UNIT/ML IV SOLN
500.0000 [IU] | Freq: Every day | INTRAVENOUS | Status: AC | PRN
  Administered 2015-06-19: 500 [IU]
  Filled 2015-06-19: qty 5

## 2015-06-19 MED ORDER — DIPHENHYDRAMINE HCL 25 MG PO CAPS
ORAL_CAPSULE | ORAL | Status: AC
  Filled 2015-06-19: qty 1

## 2015-06-19 MED ORDER — ACETAMINOPHEN 325 MG PO TABS
ORAL_TABLET | ORAL | Status: AC
  Filled 2015-06-19: qty 2

## 2015-06-19 MED ORDER — DIPHENHYDRAMINE HCL 25 MG PO CAPS
25.0000 mg | ORAL_CAPSULE | Freq: Once | ORAL | Status: AC
  Administered 2015-06-19: 25 mg via ORAL

## 2015-06-19 MED ORDER — SODIUM CHLORIDE 0.9 % IJ SOLN
10.0000 mL | INTRAMUSCULAR | Status: AC | PRN
  Administered 2015-06-19: 10 mL
  Filled 2015-06-19: qty 10

## 2015-06-19 MED ORDER — FUROSEMIDE 10 MG/ML IJ SOLN
INTRAMUSCULAR | Status: AC
  Filled 2015-06-19: qty 4

## 2015-06-19 MED ORDER — ACETAMINOPHEN 325 MG PO TABS
650.0000 mg | ORAL_TABLET | Freq: Once | ORAL | Status: AC
  Administered 2015-06-19: 650 mg via ORAL

## 2015-06-19 MED ORDER — SODIUM CHLORIDE 0.9 % IV SOLN
250.0000 mL | Freq: Once | INTRAVENOUS | Status: AC
  Administered 2015-06-19: 250 mL via INTRAVENOUS

## 2015-06-19 NOTE — Patient Instructions (Signed)
Platelet Transfusion  A platelet transfusion is a procedure in which you receive donated platelets through an IV tube. Platelets are tiny pieces of blood cells. When a blood vessel is damaged, platelets collect in the damaged area to help form a blood clot. This begins the healing process. If your platelet count gets too low, your blood may have trouble clotting.  You may need a platelet transfusion if you have a condition that causes a low number of platelets (thrombocytopenia). A platelet transfusion may be used to stop or prevent bleeding.  LET YOUR HEALTH CARE PROVIDER KNOW ABOUT:   Any allergies you have.   All medicines you are taking, including vitamins, herbs, eye drops, creams, and over-the-counter medicines.   Previous problems you or members of your family have had with the use of anesthetics.   Any blood disorders you have.   Previous surgeries you have had.   Any medical conditions you may have.   Any reactions you have had during a previous transfusion. RISKS AND COMPLICATIONS Generally, this is a safe procedure. However, problems may occur, including:   Fever with or without chills. The fever usually occurs within the first 4 hours of the transfusion and returns to normal within 48 hours.  Allergic reaction. The reaction is most commonly caused by antibodies your body creates against substances in the transfusion. Signs of an allergic reaction may include itching, hives, difficulty breathing, shock, or low blood pressure.  Sudden (acute) or delayed hemolytic reaction. This rare reaction can occur during the transfusion and up to 28 days after the transfusion. The reaction usually occurs when your body's defense system (immune system) attacks the new platelets. Signs of a hemolytic reaction may include fever, headache, difficulty breathing, low blood pressure, a rapid heartbeat, or pain in your back, abdomen, chest, or IV site.  Transfusion-related acute lung injury  (TRALI). TRALI can occur within hours of a transfusion, or several days later. This is a rare reaction that causes lung damage. The cause is not known.  Infection. Signs of this rare complication may include fever, chills, vomiting, a rapid heartbeat, or low blood pressure. BEFORE THE PROCEDURE   You may have a blood test to determine your blood type. This is necessary to find out what kind ofplatelets best matches your platelets.  If you have had an allergic reaction to a transfusion in the past, you may be given medicine to help prevent a reaction. Take this medicine only as directed by your health care provider.  Your temperature, blood pressure, and pulse will be monitored before the transfusion. PROCEDURE  An IV will be started in your hand or arm.  The transfusion will be attached to your IV tubing. The bag of donated platelets will be attached to your IV tube andgiven into your vein.  Your temperature, blood pressure, and pulse will be monitored regularly during the transfusion. This monitoring is done to help detect early signs of a transfusion reaction.  If you have any signs or symptoms of a reaction, your transfusion will be stopped and you may be given medicine.  When your transfusion is complete, your IV will be removed.  Pressure may be applied to the IV site for a few minutes.  A bandage (dressing) will be applied. The procedure may vary among health care providers and hospitals. AFTER THE PROCEDURE  Your blood pressure, temperature, and pulse will be monitored regularly.   This information is not intended to replace advice given to you by your health   care provider. Make sure you discuss any questions you have with your health care provider.   Document Released: 05/15/2007 Document Revised: 08/08/2014 Document Reviewed: 05/28/2014 Elsevier Interactive Patient Education 2016 [employer]  

## 2015-06-20 LAB — PREPARE PLATELET PHERESIS: UNIT DIVISION: 0

## 2015-06-22 ENCOUNTER — Encounter: Payer: Self-pay | Admitting: Radiation Oncology

## 2015-06-23 ENCOUNTER — Other Ambulatory Visit: Payer: BLUE CROSS/BLUE SHIELD

## 2015-06-23 ENCOUNTER — Ambulatory Visit: Payer: BLUE CROSS/BLUE SHIELD

## 2015-06-24 ENCOUNTER — Telehealth: Payer: Self-pay | Admitting: Oncology

## 2015-06-24 ENCOUNTER — Observation Stay
Admission: AD | Admit: 2015-06-24 | Payer: BLUE CROSS/BLUE SHIELD | Source: Ambulatory Visit | Admitting: Hematology & Oncology

## 2015-06-24 ENCOUNTER — Other Ambulatory Visit (HOSPITAL_BASED_OUTPATIENT_CLINIC_OR_DEPARTMENT_OTHER): Payer: BLUE CROSS/BLUE SHIELD

## 2015-06-24 ENCOUNTER — Encounter (HOSPITAL_COMMUNITY): Payer: Self-pay | Admitting: *Deleted

## 2015-06-24 ENCOUNTER — Telehealth: Payer: Self-pay | Admitting: *Deleted

## 2015-06-24 ENCOUNTER — Ambulatory Visit
Admit: 2015-06-24 | Discharge: 2015-06-24 | Disposition: A | Discharge status: Home / Self Care | Payer: BLUE CROSS/BLUE SHIELD | Attending: Radiation Oncology | Admitting: Radiation Oncology

## 2015-06-24 ENCOUNTER — Emergency Department (HOSPITAL_COMMUNITY): Payer: BLUE CROSS/BLUE SHIELD

## 2015-06-24 ENCOUNTER — Ambulatory Visit (HOSPITAL_BASED_OUTPATIENT_CLINIC_OR_DEPARTMENT_OTHER): Payer: BLUE CROSS/BLUE SHIELD

## 2015-06-24 ENCOUNTER — Inpatient Hospital Stay (HOSPITAL_COMMUNITY)
Admission: EM | Admit: 2015-06-24 | Discharge: 2015-07-04 | DRG: 840 | Disposition: A | Discharge status: Home / Self Care | Payer: BLUE CROSS/BLUE SHIELD | Attending: Hematology & Oncology | Admitting: Hematology & Oncology

## 2015-06-24 ENCOUNTER — Encounter: Payer: Self-pay | Admitting: Radiation Oncology

## 2015-06-24 VITALS — BP 105/69 | HR 129 | Temp 98.9°F | Ht 71.0 in | Wt 195.0 lb

## 2015-06-24 DIAGNOSIS — E876 Hypokalemia: Secondary | ICD-10-CM | POA: Diagnosis not present

## 2015-06-24 DIAGNOSIS — B9689 Other specified bacterial agents as the cause of diseases classified elsewhere: Secondary | ICD-10-CM | POA: Diagnosis present

## 2015-06-24 DIAGNOSIS — R7881 Bacteremia: Secondary | ICD-10-CM | POA: Diagnosis not present

## 2015-06-24 DIAGNOSIS — Z8249 Family history of ischemic heart disease and other diseases of the circulatory system: Secondary | ICD-10-CM | POA: Diagnosis not present

## 2015-06-24 DIAGNOSIS — G893 Neoplasm related pain (acute) (chronic): Secondary | ICD-10-CM | POA: Diagnosis present

## 2015-06-24 DIAGNOSIS — Z923 Personal history of irradiation: Secondary | ICD-10-CM

## 2015-06-24 DIAGNOSIS — C9002 Multiple myeloma in relapse: Principal | ICD-10-CM | POA: Diagnosis present

## 2015-06-24 DIAGNOSIS — C9000 Multiple myeloma not having achieved remission: Secondary | ICD-10-CM | POA: Diagnosis not present

## 2015-06-24 DIAGNOSIS — B958 Unspecified staphylococcus as the cause of diseases classified elsewhere: Secondary | ICD-10-CM | POA: Diagnosis present

## 2015-06-24 DIAGNOSIS — R509 Fever, unspecified: Secondary | ICD-10-CM

## 2015-06-24 DIAGNOSIS — D61818 Other pancytopenia: Secondary | ICD-10-CM

## 2015-06-24 DIAGNOSIS — B957 Other staphylococcus as the cause of diseases classified elsewhere: Secondary | ICD-10-CM | POA: Diagnosis present

## 2015-06-24 DIAGNOSIS — Z66 Do not resuscitate: Secondary | ICD-10-CM | POA: Diagnosis present

## 2015-06-24 DIAGNOSIS — Z95828 Presence of other vascular implants and grafts: Secondary | ICD-10-CM

## 2015-06-24 DIAGNOSIS — D709 Neutropenia, unspecified: Secondary | ICD-10-CM | POA: Diagnosis not present

## 2015-06-24 DIAGNOSIS — T451X5A Adverse effect of antineoplastic and immunosuppressive drugs, initial encounter: Secondary | ICD-10-CM | POA: Diagnosis present

## 2015-06-24 DIAGNOSIS — D61810 Antineoplastic chemotherapy induced pancytopenia: Secondary | ICD-10-CM | POA: Diagnosis present

## 2015-06-24 DIAGNOSIS — K5900 Constipation, unspecified: Secondary | ICD-10-CM | POA: Diagnosis present

## 2015-06-24 DIAGNOSIS — Z9484 Stem cells transplant status: Secondary | ICD-10-CM

## 2015-06-24 DIAGNOSIS — Z510 Encounter for antineoplastic radiation therapy: Secondary | ICD-10-CM | POA: Diagnosis present

## 2015-06-24 DIAGNOSIS — R531 Weakness: Secondary | ICD-10-CM | POA: Diagnosis present

## 2015-06-24 DIAGNOSIS — Z79899 Other long term (current) drug therapy: Secondary | ICD-10-CM | POA: Diagnosis not present

## 2015-06-24 DIAGNOSIS — R52 Pain, unspecified: Secondary | ICD-10-CM | POA: Diagnosis not present

## 2015-06-24 DIAGNOSIS — A419 Sepsis, unspecified organism: Secondary | ICD-10-CM

## 2015-06-24 DIAGNOSIS — D701 Agranulocytosis secondary to cancer chemotherapy: Secondary | ICD-10-CM

## 2015-06-24 DIAGNOSIS — R5081 Fever presenting with conditions classified elsewhere: Secondary | ICD-10-CM | POA: Diagnosis present

## 2015-06-24 DIAGNOSIS — M8458XA Pathological fracture in neoplastic disease, other specified site, initial encounter for fracture: Secondary | ICD-10-CM | POA: Diagnosis present

## 2015-06-24 DIAGNOSIS — R197 Diarrhea, unspecified: Secondary | ICD-10-CM | POA: Diagnosis not present

## 2015-06-24 DIAGNOSIS — D696 Thrombocytopenia, unspecified: Secondary | ICD-10-CM | POA: Diagnosis not present

## 2015-06-24 HISTORY — DX: Reserved for inherently not codable concepts without codable children: IMO0001

## 2015-06-24 HISTORY — DX: Reserved for concepts with insufficient information to code with codable children: IMO0002

## 2015-06-24 LAB — COMPREHENSIVE METABOLIC PANEL (CC13)
ALBUMIN: 2.2 g/dL — AB (ref 3.5–5.0)
ALK PHOS: 101 U/L (ref 40–150)
ALT: 46 U/L (ref 0–55)
AST: 56 U/L — AB (ref 5–34)
Anion Gap: 10 mEq/L (ref 3–11)
BUN: 21.3 mg/dL (ref 7.0–26.0)
CALCIUM: 15.5 mg/dL — AB (ref 8.4–10.4)
CO2: 28 mEq/L (ref 22–29)
CREATININE: 1.1 mg/dL (ref 0.7–1.3)
Chloride: 95 mEq/L — ABNORMAL LOW (ref 98–109)
EGFR: >90 mL/min/{1.73_m2} (ref >90)
GLUCOSE: 148 mg/dL — AB (ref 70–140)
POTASSIUM: 3.6 meq/L (ref 3.5–5.1)
SODIUM: 133 meq/L — AB (ref 136–145)
Total Bilirubin: 1.94 mg/dL — ABNORMAL HIGH (ref 0.20–1.20)
Total Protein: 6.1 g/dL — ABNORMAL LOW (ref 6.4–8.3)

## 2015-06-24 LAB — CBC WITH DIFFERENTIAL/PLATELET
Basophils Relative: 4 %
EOS PCT: 0 %
HCT: 22.9 % — ABNORMAL LOW (ref 39.0–52.0)
Hemoglobin: 7.9 g/dL — ABNORMAL LOW (ref 13.0–17.0)
LYMPHS PCT: 29 %
MCH: 29.5 pg (ref 26.0–34.0)
MCHC: 34.5 g/dL (ref 30.0–36.0)
MCV: 85.4 fL (ref 78.0–100.0)
MONOS PCT: 19 %
Neutrophils Relative %: 48 %
RBC: 2.68 MIL/uL — ABNORMAL LOW (ref 4.22–5.81)
RDW: 19.1 % — AB (ref 11.5–15.5)
WBC: <0.1 10*3/uL — CL (ref 4.0–10.5)

## 2015-06-24 LAB — I-STAT CG4 LACTIC ACID, ED: Lactic Acid, Venous: 1.81 mmol/L (ref 0.5–2.0)

## 2015-06-24 MED ORDER — VANCOMYCIN HCL IN DEXTROSE 1-5 GM/200ML-% IV SOLN
1000.0000 mg | Freq: Once | INTRAVENOUS | Status: AC
  Administered 2015-06-24: 1000 mg via INTRAVENOUS
  Filled 2015-06-24: qty 200

## 2015-06-24 MED ORDER — ZOLEDRONIC ACID 4 MG/5ML IV CONC
4.0000 mg | Freq: Once | INTRAVENOUS | Status: AC
  Administered 2015-06-24: 4 mg via INTRAVENOUS
  Filled 2015-06-24: qty 5

## 2015-06-24 MED ORDER — SODIUM CHLORIDE 0.9 % IV SOLN
Freq: Once | INTRAVENOUS | Status: AC
  Administered 2015-06-24: 23:00:00 via INTRAVENOUS

## 2015-06-24 MED ORDER — ACETAMINOPHEN 325 MG PO TABS
650.0000 mg | ORAL_TABLET | Freq: Four times a day (QID) | ORAL | Status: DC | PRN
  Administered 2015-06-24 – 2015-06-25 (×3): 650 mg via ORAL
  Filled 2015-06-24 (×3): qty 2

## 2015-06-24 MED ORDER — SODIUM CHLORIDE 0.9 % IV SOLN
INTRAVENOUS | Status: DC
Start: 2015-06-24 — End: 2015-06-28
  Administered 2015-06-26 (×2): via INTRAVENOUS

## 2015-06-24 MED ORDER — SODIUM CHLORIDE 0.9 % IV SOLN
1000.0000 mL | INTRAVENOUS | Status: DC
  Administered 2015-06-24: 1000 mL via INTRAVENOUS

## 2015-06-24 MED ORDER — MORPHINE SULFATE (PF) 4 MG/ML IV SOLN
8.0000 mg | INTRAVENOUS | Status: DC | PRN
  Administered 2015-06-24 – 2015-07-03 (×31): 8 mg via INTRAVENOUS
  Filled 2015-06-24 (×33): qty 2

## 2015-06-24 MED ORDER — FENTANYL 25 MCG/HR TD PT72: 75.0000 ug | MEDICATED_PATCH | TRANSDERMAL | Status: DC

## 2015-06-24 MED ORDER — SODIUM CHLORIDE 0.9 % IV BOLUS (SEPSIS)
1000.0000 mL | Freq: Once | INTRAVENOUS | Status: AC
  Administered 2015-06-24: 1000 mL via INTRAVENOUS

## 2015-06-24 MED ORDER — HEPARIN SOD (PORK) LOCK FLUSH 100 UNIT/ML IV SOLN
500.0000 [IU] | Freq: Once | INTRAVENOUS | Status: AC
  Administered 2015-06-24: 500 [IU] via INTRAVENOUS
  Filled 2015-06-24: qty 5

## 2015-06-24 MED ORDER — PANTOPRAZOLE SODIUM 40 MG PO TBEC
40.0000 mg | DELAYED_RELEASE_TABLET | Freq: Every day | ORAL | Status: DC
  Administered 2015-06-24 – 2015-07-04 (×11): 40 mg via ORAL
  Filled 2015-06-24 (×12): qty 1

## 2015-06-24 MED ORDER — IPRATROPIUM-ALBUTEROL 0.5-2.5 (3) MG/3ML IN SOLN: 3.0000 mL | RESPIRATORY_TRACT | Status: DC | PRN

## 2015-06-24 MED ORDER — SODIUM CHLORIDE 0.9 % IJ SOLN
10.0000 mL | INTRAMUSCULAR | Status: DC | PRN
Start: 2015-06-24 — End: 2015-06-24
  Administered 2015-06-24: 10 mL via INTRAVENOUS
  Filled 2015-06-24: qty 10

## 2015-06-24 MED ORDER — CALCITONIN (SALMON) 200 UNIT/ML IJ SOLN
540.0000 [IU] | Freq: Three times a day (TID) | INTRAMUSCULAR | Status: AC
  Administered 2015-06-24 – 2015-06-25 (×3): 540 [IU] via INTRAMUSCULAR
  Filled 2015-06-24 (×4): qty 2.7

## 2015-06-24 MED ORDER — IPRATROPIUM-ALBUTEROL 0.5-2.5 (3) MG/3ML IN SOLN
3.0000 mL | Freq: Four times a day (QID) | RESPIRATORY_TRACT | Status: DC
  Filled 2015-06-24: qty 3

## 2015-06-24 MED ORDER — CYCLOBENZAPRINE HCL 10 MG PO TABS
10.0000 mg | ORAL_TABLET | Freq: Three times a day (TID) | ORAL | Status: DC | PRN
  Administered 2015-06-24 – 2015-07-04 (×11): 10 mg via ORAL
  Filled 2015-06-24 (×11): qty 1

## 2015-06-24 MED ORDER — SODIUM CHLORIDE 0.9 % IV SOLN
1000.0000 mL | Freq: Once | INTRAVENOUS | Status: AC
  Administered 2015-06-24: 1000 mL via INTRAVENOUS

## 2015-06-24 MED ORDER — BISACODYL 5 MG PO TBEC
5.0000 mg | DELAYED_RELEASE_TABLET | Freq: Every day | ORAL | Status: DC | PRN
  Administered 2015-07-02: 5 mg via ORAL
  Filled 2015-06-24: qty 1

## 2015-06-24 MED ORDER — FENTANYL 100 MCG/HR TD PT72
100.0000 ug | MEDICATED_PATCH | TRANSDERMAL | Status: DC
Start: 2015-06-24 — End: 2015-06-26
  Administered 2015-06-24: 100 ug via TRANSDERMAL
  Filled 2015-06-24: qty 1

## 2015-06-24 MED ORDER — MORPHINE SULFATE (PF) 4 MG/ML IV SOLN
8.0000 mg | Freq: Once | INTRAVENOUS | Status: AC
  Administered 2015-06-24: 8 mg via INTRAVENOUS
  Filled 2015-06-24: qty 2

## 2015-06-24 MED ORDER — FAMCICLOVIR 500 MG PO TABS
500.0000 mg | ORAL_TABLET | Freq: Every day | ORAL | Status: DC
  Administered 2015-06-24 – 2015-06-28 (×5): 500 mg via ORAL
  Filled 2015-06-24 (×6): qty 1

## 2015-06-24 MED ORDER — SODIUM CHLORIDE 0.9 % IV BOLUS (SEPSIS)
500.0000 mL | Freq: Once | INTRAVENOUS | Status: AC
  Administered 2015-06-24: 500 mL via INTRAVENOUS

## 2015-06-24 MED ORDER — LORAZEPAM 2 MG/ML IJ SOLN: 1.0000 mg | INTRAMUSCULAR | Status: DC | PRN

## 2015-06-24 MED ORDER — FLUCONAZOLE 100 MG PO TABS
100.0000 mg | ORAL_TABLET | Freq: Every day | ORAL | Status: DC
  Administered 2015-06-24 – 2015-06-30 (×7): 100 mg via ORAL
  Filled 2015-06-24 (×9): qty 1

## 2015-06-24 MED ORDER — DEXTROSE 5 % IV SOLN
2.0000 g | Freq: Three times a day (TID) | INTRAVENOUS | Status: DC
  Administered 2015-06-24 – 2015-07-01 (×20): 2 g via INTRAVENOUS
  Filled 2015-06-24 (×22): qty 2

## 2015-06-24 MED ORDER — LORAZEPAM 0.5 MG PO TABS: 0.5000 mg | ORAL_TABLET | Freq: Four times a day (QID) | ORAL | Status: DC | PRN

## 2015-06-24 MED ORDER — TEMAZEPAM 7.5 MG PO CAPS: 22.5000 mg | ORAL_CAPSULE | Freq: Every evening | ORAL | Status: DC | PRN

## 2015-06-24 NOTE — ED Notes (Signed)
Bed: WA08 Expected date:  Expected time:  Means of arrival:  Comments: [place] Pt- [facility] Pace

## 2015-06-24 NOTE — ED Notes (Signed)
Report given to Elsie, RN.

## 2015-06-24 NOTE — Telephone Encounter (Signed)
Critical Value Calcium 15.5 Dr Marin Olp notified. Patient is being sent to the ED

## 2015-06-24 NOTE — Telephone Encounter (Signed)
Called ER charge nurse and notified them that Mark [name] will be sent to the ER for calcium of 15.5.  She said to go to room 8.  Patient transported to ER by wheelchair by Patric Dykes, RN and Kandy Garrison, RN.

## 2015-06-24 NOTE — ED Notes (Signed)
Nurse drawing labs. 

## 2015-06-24 NOTE — ED Provider Notes (Signed)
CSN: [id number]     Arrival date & time 06/24/15  1644 History   First MD Initiated Contact with Patient 06/24/15 1651     Chief Complaint  Patient presents with  . Ca pt, Calcium 15.5      (Consider location/radiation/quality/duration/timing/severity/associated sxs/prior Treatment) The history is provided by the patient.   44 year old male with history of multiple myeloma was directed to the ED for hospital admission. He was noted to have severe hypercalcemia in his physician's office today. He is been complaining of feeling weak for the last 7-10 days. He has had fevers at home as high as 103.3. There's been a dry cough. He denies rhinorrhea or sore throat. There's been no nausea or vomiting. Has had a few loose stools but no overt diarrhea. She he denies any urinary symptoms. He has chronic neck pain related to a tumor and this is unchanged.  Past Medical History  Diagnosis Date  . History of radiation therapy 02/07/13- 02/26/13    lower L spine, upper sacrum, 35 gray in 14 fractions  . FH: chemotherapy     Dr. Burney Gauze  . Cancer ([place])   . Multiple myeloma ([place]) dx'd 01/2013  . GERD (gastroesophageal reflux disease)   . Radiation 01/01/2015-01/14/2015    lower lumbar spine/upper sacrum area20 gray    Past Surgical History  Procedure Laterality Date  . Portacath placement    . 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 Kyphoplasty  05/20/14  . Lumbar laminectomy/decompression microdiscectomy N/A 09/25/2014    Procedure: LUMBAR LAMINECTOMY/DECOMPRESSION MICRODISCECTOMY;  Surgeon: Sinclair Ship, MD;  Location: [facility];  Service: Orthopedics;  Laterality: N/A;  Lumbar 4-5, L5-S1  decompression  . Esophagogastroduodenoscopy N/A 02/06/2015    Procedure: ESOPHAGOGASTRODUODENOSCOPY (EGD);  Surgeon: Teena Irani, MD;  Location: [facility];  Service: Endoscopy;  Laterality: N/A;   Family History  Problem Relation Age of Onset  . Diabetic kidney disease Mother   .  Hypertension Mother   . Heart attack Mother   . Kidney failure Mother   . Coronary artery disease Mother   . HIV Father    Social History  Substance Use Topics  . Smoking status: Former Smoker -- 1.00 packs/day for 15 years    Types: Cigarettes    Start date: 06/29/1988    Quit date: 06/29/2003  . Smokeless tobacco: Never Used     Comment: quit smoking 10 years ago  . Alcohol Use: No    Review of Systems  All other systems reviewed and are negative.     Allergies  Review of patient's allergies indicates no known allergies.  Home Medications   Prior to Admission medications   Medication Sig Start Date End Date Taking? Authorizing Provider  antiseptic oral rinse (BIOTENE) LIQD 15 mLs by Mouth Rinse route QID. 06/16/15   [name], MD  bisacodyl (DULCOLAX) 5 MG EC tablet Take 5 mg by mouth daily as needed for moderate constipation (constipation).     Historical Provider, MD  clonazePAM (KLONOPIN) 0.5 MG tablet Take 1 tablet (0.5 mg total) by mouth 3 (three) times daily as needed for anxiety. 05/13/15   [name], NP  cyclobenzaprine (FLEXERIL) 10 MG tablet Take 1 tablet (10 mg total) by mouth 3 (three) times daily as needed for muscle spasms. 06/16/15   [name], MD  famciclovir (FAMVIR) 500 MG tablet Take 1 tablet (500 mg total) by mouth daily. 02/11/15   [name], MD  fentaNYL (DURAGESIC - DOSED MCG/HR) 100 MCG/HR Place 1 patch (100 mcg total) onto the skin every other day. 05/22/15   [name], MD  fluconazole (DIFLUCAN) 100 MG tablet Take 1 tablet (100 mg total) by mouth daily. 03/30/15   [name], NP  gabapentin (NEURONTIN) 300 MG capsule Take 2 capsules (600 mg total) by mouth 3 (three) times daily. 05/05/15   [name], MD  HYDROmorphone (DILAUDID) 4 MG tablet Take 4-8 mg by mouth every 4 (four) hours as needed for moderate pain or severe pain.    Historical Provider, MD  Ipratropium-Albuterol (COMBIVENT) 20-100 MCG/ACT AERS  respimat Inhale 1 puff into the lungs every 6 (six) hours. Patient not taking: Reported on 06/24/2015 03/30/15   [name], NP  levofloxacin (LEVAQUIN) 500 MG tablet Take 1 tablet (500 mg total) by mouth daily. Patient not taking: Reported on 06/24/2015 06/16/15   [name], MD  lidocaine-prilocaine (EMLA) cream Apply to affected area once 02/18/15   [name], MD  LORazepam (ATIVAN) 0.5 MG tablet Take 1 tablet (0.5 mg total) by mouth every 6 (six) hours as needed (Nausea or vomiting). 02/18/15   [name], MD  metoCLOPramide (REGLAN) 10 MG tablet Take 1 tablet (10 mg total) by mouth every 6 (six) hours as needed for nausea or vomiting. 01/27/15   Shanon Rosser, MD  ondansetron (ZOFRAN ODT) 8 MG disintegrating tablet Take 1 tablet (8 mg total) by mouth every 8 (eight) hours as needed for nausea or vomiting. 02/04/15   [name], MD  pantoprazole (PROTONIX) 40 MG tablet Take 1 tablet (40 mg total) by mouth daily. 05/05/15   [name], MD  prochlorperazine (COMPAZINE) 10 MG tablet Take 1 tablet (10 mg total) by mouth every 6 (six) hours as needed (Nausea or vomiting). 02/18/15   [name], MD  saccharomyces boulardii (FLORASTOR) 250 MG capsule Take 1 capsule (250 mg total) by mouth 2 (two) times daily. 05/05/15   [name], MD  temazepam (RESTORIL) 22.5 MG capsule Take 1 capsule (22.5 mg total) by mouth at bedtime as needed for sleep. 02/04/15   [name], MD   BP 148/75 mmHg  Pulse 130  Temp(Src) 100.1 F (37.8 C) (Oral)  Resp 15  Wt 195 lb (88.451 kg)  SpO2 100% Physical Exam  Nursing note and vitals reviewed.  44 year old male, resting comfortably and in no acute distress. Vital signs are significant for tachycardia and hypertension as well as borderline fever. Oxygen saturation is 100%, which is normal. Head is normocephalic and atraumatic. PERRLA, EOMI. Oropharynx is clear. Neck is nontender and supple without adenopathy or JVD. Back is  nontender and there is no CVA tenderness. Lungs are clear without rales, wheezes, or rhonchi. Chest is nontender. Mediport is present in the right parasternal area. Heart has regular rate and rhythm without murmur. Abdomen is soft, flat, nontender without masses or hepatosplenomegaly and peristalsis is normoactive. Extremities have no cyanosis or edema, full range of motion is present. Skin is warm and dry without rash. Neurologic: Mental status is normal, cranial nerves are intact, there are no motor or sensory deficits.  ED Course  Procedures (including critical care time) Labs Review Results for orders placed or performed during the hospital encounter of 06/24/15  Culture, blood (routine x 2)  Result Value Ref Range   Specimen Description BLOOD PORTA CATH    Special Requests BOTTLES DRAWN AEROBIC AND ANAEROBIC 5 ML  Culture  Setup Time      GRAM POSITIVE COCCI IN CLUSTERS ANAEROBIC BOTTLE ONLY CRITICAL RESULT CALLED TO, READ BACK BY AND VERIFIED WITH: D ALAVER 06/25/15 @ [place]    Culture      GRAM POSITIVE COCCI Performed at [facility]    Report Status PENDING   Culture, blood (routine x 2)  Result Value Ref Range   Specimen Description BLOOD RIGHT ANTECUBITAL    Special Requests BOTTLES DRAWN AEROBIC AND ANAEROBIC 5 ML    Culture      NO GROWTH < 12 HOURS Performed at [facility]    Report Status PENDING   CBC with Differential  Result Value Ref Range   WBC <0.1 (LL) 4.0 - 10.5 K/uL   RBC 2.68 (L) 4.22 - 5.81 MIL/uL   Hemoglobin 7.9 (L) 13.0 - 17.0 g/dL   HCT 22.9 (L) 39.0 - 52.0 %   MCV 85.4 78.0 - 100.0 fL   MCH 29.5 26.0 - 34.0 pg   MCHC 34.5 30.0 - 36.0 g/dL   RDW 19.1 (H) 11.5 - 15.5 %   Platelets <5 (LL) 150 - 400 K/uL   Neutrophils Relative % 48 %   Lymphocytes Relative 29 %   Monocytes Relative 19 %   Eosinophils Relative 0 %   Basophils Relative 4 %   RBC Morphology TARGET CELLS    WBC Morphology DOHLE BODIES   Urinalysis,  Routine w reflex microscopic  Result Value Ref Range   Color, Urine YELLOW YELLOW   APPearance CLEAR CLEAR   Specific Gravity, Urine 1.010 1.005 - 1.030   pH 6.0 5.0 - 8.0   Glucose, UA NEGATIVE NEGATIVE mg/dL   Hgb urine dipstick MODERATE (A) NEGATIVE   Bilirubin Urine NEGATIVE NEGATIVE   Ketones, ur NEGATIVE NEGATIVE mg/dL   Protein, ur NEGATIVE NEGATIVE mg/dL   Nitrite NEGATIVE NEGATIVE   Leukocytes, UA NEGATIVE NEGATIVE  Comprehensive metabolic panel  Result Value Ref Range   Sodium 138 135 - 145 mmol/L   Potassium 3.5 3.5 - 5.1 mmol/L   Chloride 103 101 - 111 mmol/L   CO2 28 22 - 32 mmol/L   Glucose, Bld 143 (H) 65 - 99 mg/dL   BUN 17 6 - 20 mg/dL   Creatinine, Ser 0.93 0.61 - 1.24 mg/dL   Calcium 11.6 (H) 8.9 - 10.3 mg/dL   Total Protein 5.4 (L) 6.5 - 8.1 g/dL   Albumin 2.1 (L) 3.5 - 5.0 g/dL   AST 40 15 - 41 U/L   ALT 35 17 - 63 U/L   Alkaline Phosphatase 70 38 - 126 U/L   Total Bilirubin 1.5 (H) 0.3 - 1.2 mg/dL   GFR calc non Af Amer >60 >60 mL/min   GFR calc Af Amer >60 >60 mL/min   Anion gap 7 5 - 15  CBC  Result Value Ref Range   WBC 0.3 (LL) 4.0 - 10.5 K/uL   RBC 2.22 (L) 4.22 - 5.81 MIL/uL   Hemoglobin 6.5 (LL) 13.0 - 17.0 g/dL   HCT 19.2 (L) 39.0 - 52.0 %   MCV 86.5 78.0 - 100.0 fL   MCH 29.3 26.0 - 34.0 pg   MCHC 33.9 30.0 - 36.0 g/dL   RDW 19.2 (H) 11.5 - 15.5 %   Platelets 10 (LL) 150 - 400 K/uL  Urine microscopic-add on  Result Value Ref Range   Squamous Epithelial / LPF 0-5 (A) NONE SEEN   WBC, UA NONE SEEN 0 -  5 WBC/hpf   RBC / HPF 0-5 0 - 5 RBC/hpf   Bacteria, UA NONE SEEN NONE SEEN  I-Stat CG4 Lactic Acid, ED  Result Value Ref Range   Lactic Acid, Venous 1.81 0.5 - 2.0 mmol/L  Prepare Pheresed Platelets  Result Value Ref Range   Unit Number [id number]    Blood Component Type PLTPH LI2 PAS    Unit division 00    Status of Unit ISSUED,FINAL    Transfusion Status OK TO TRANSFUSE   Prepare RBC  Result Value Ref Range   Order  Confirmation ORDER PROCESSED BY BLOOD BANK   Prepare Pheresed Platelets  Result Value Ref Range   Unit Number [id number]    Blood Component Type PLTPHER LRI1    Unit division 00    Status of Unit ISSUED    Transfusion Status OK TO TRANSFUSE   Type and screen [facility]  Result Value Ref Range   ABO/RH(D) B POS    Antibody Screen POS    Sample Expiration 06/28/2015    Antibody Identification NO CLINICALLY SIGNIFICANT ANTIBODY IDENTIFIED    Unit Number [id number]    Blood Component Type RBC, LR IRR    Unit division 00    Status of Unit ISSUED    Transfusion Status OK TO TRANSFUSE    Crossmatch Result COMPATIBLE    Donor AG Type      NEGATIVE FOR C ANTIGEN NEGATIVE FOR KELL ANTIGEN NEGATIVE FOR DUFFY A ANTIGEN NEGATIVE FOR DUFFY B ANTIGEN   Unit Number [id number]    Blood Component Type RBC, LR IRR    Unit division 00    Status of Unit ALLOCATED    Donor AG Type PENDING    Transfusion Status PENDING    Crossmatch Result PENDING    Unit Number [id number]    Blood Component Type RBC, LR IRR    Unit division 00    Status of Unit ISSUED    Donor AG Type      NEGATIVE FOR C ANTIGEN NEGATIVE FOR KELL ANTIGEN NEGATIVE FOR DUFFY A ANTIGEN NEGATIVE FOR DUFFY B ANTIGEN   Transfusion Status OK TO TRANSFUSE    Crossmatch Result COMPATIBLE     Imaging Review Dg Chest Port 1 View  06/24/2015  CLINICAL DATA:  Stage IV multiple myeloma.  Fever for 5 days EXAM: PORTABLE CHEST 1 VIEW COMPARISON:  June 10, 2015 FINDINGS: There is again noted a mass arising from the pleura on the left, currently measuring approximately 4.9 x 4.0 cm. The lungs elsewhere are clear. No edema or consolidation. Heart size and pulmonary vascularity are normal. Lucencies in each lateral clavicle most likely are secondary to the known multiple myeloma. A lesion in the lateral right seventh rib is also likely due to multiple myeloma. Port-A-Cath tip is at the cavoatrial junction. No  pneumothorax. Previous left-sided catheter is no longer appreciable. IMPRESSION: Mass in the periphery of the left lung again noted consistent with the known multiple myeloma. Bony changes of multiple myeloma are noted as well. No lung edema or consolidation. No change in cardiac silhouette. Electronically Signed   By: Lowella Grip III M.D.   On: 06/24/2015 18:34   I have personally reviewed and evaluated these images and lab results as part of my medical decision-making.  CRITICAL CARE Performed by: Delora Fuel Total critical care time: 45 minutes Critical care time was exclusive of separately billable procedures and treating other patients. Critical care was necessary to treat or prevent imminent or  life-threatening deterioration. Critical care was time spent personally by me on the following activities: development of treatment plan with patient and/or surrogate as well as nursing, discussions with consultants, evaluation of patient's response to treatment, examination of patient, obtaining history from patient or surrogate, ordering and performing treatments and interventions, ordering and review of laboratory studies, ordering and review of radiographic studies, pulse oximetry and re-evaluation of patient's condition.  MDM   Final diagnoses:  Pancytopenia ([place])  Febrile neutropenia ([facility])  Multiple myeloma in relapse ([facility])  Hypercalcemia    Febrile neutropenia. Old records are reviewed and on November 17, he had calcium of 11.7 and CBC of 0.2. He is also severely thrombocytopenic. Today, calcium is 15.5. Renal function is normal. On treatment for hypercalcemia is seeming diuresis. He'll be started on early goal-directed of fluid therapy based on his tachycardia and furosemide will be added to facilitate treatment of hypercalcemia. He is started on by mouth Antibiotics for febrile neutropenia. He will need to be admitted.  Dr. Marin Olp, patient's oncologist, has been in to talk with the  patient and he is started on hospice care. Dr. Marin Olp will manage patient's hypercalcemia and has arranged for hospital admission.  Delora Fuel, MD 88/50/27 [id number]

## 2015-06-24 NOTE — ED Notes (Addendum)
Dr Martha Clan at bedside, discussing current treatment plan, discussing hospice. Wife and son present.    Blood draw, xray, and urine collected delayed.

## 2015-06-24 NOTE — Patient Instructions (Signed)

## 2015-06-24 NOTE — ED Notes (Addendum)
Pt has stage IV multiple myeloma, was in hopsital receiving 4 days of chemo from 11/10-11/13. Pt has had fever x5 days with weakness. Chronic neck and back pain 7/10. Pt was at cancer center for radiation consultation, when his blood work came back with calcium 15.5. Pt was sent to ED.

## 2015-06-24 NOTE — Progress Notes (Signed)
ANTIBIOTIC CONSULT NOTE - INITIAL  Pharmacy Consult for [facility] Indication: febrile neutropenia  No Known Allergies  Patient Measurements: Weight: 195 lb (88.451 kg)  Vital Signs: Temp: 100.1 F (37.8 C) (11/23 1647) Temp Source: Oral (11/23 1647) BP: 130/67 mmHg (11/23 1720) Pulse Rate: 126 (11/23 1720) Intake/Output from previous day:   Intake/Output from this shift:    Labs:  Recent Labs  06/24/15 1518  CREATININE 1.1   Estimated Creatinine Clearance: 92.2 mL/min (by C-G formula based on Cr of 1.1). No results for input(s): VANCOTROUGH, VANCOPEAK, VANCORANDOM, GENTTROUGH, GENTPEAK, GENTRANDOM, TOBRATROUGH, TOBRAPEAK, TOBRARND, AMIKACINPEAK, AMIKACINTROU, AMIKACIN in the last 72 hours.   Microbiology: No results found for this or any previous visit (from the past 720 hour(s)).  Medical History: Past Medical History  Diagnosis Date  . History of radiation therapy 02/07/13- 02/26/13    lower L spine, upper sacrum, 35 gray in 14 fractions  . FH: chemotherapy     Dr. Burney Gauze  . Cancer ([place])   . Multiple myeloma ([place]) dx'd 01/2013  . GERD (gastroesophageal reflux disease)   . Radiation 01/01/2015-01/14/2015    lower lumbar spine/upper sacrum area20 gray     Medications:  PTA med list pending  Assessment: 44 yo M with refractory myeloma admitted with hypercalcemia, fever, & pancytopenia.   Pharmacy asked to start [facility] for febrile neutropenia.  He received Vancomycin x1 in ED.  Discussed with Dr Marin Olp and will not continue Vancomycin on admit.   Patient has good renal function- estimated CrCl > 82m/min.   11/23 >>Fortaz  >> 11/23 Vanc x1 dose in ED  11/23 blood x2: IP / urine:   Dose changes/levels:  Goal of Therapy:  Eradicate infection.  Plan:  Fortaz 2gm IV q8h Monitor renal function and cx data  F/U patient progress  Zaiya Annunziato, ALavonia Drafts11/23/2016,5:35 PM

## 2015-06-24 NOTE — H&P (Signed)
Referral MD  Reason for Referral: Refractory myeloma, hypercalcemia fever, pancytopenia  Chief Complaint  Patient presents with  . Ca pt, Calcium 15.5   : I have very weak.  HPI: Mark [name] is well-known to me. He is a 44 year old African-American male. He has refractory lambda light chain myeloma. He has been through an autologous stem cell transplant. We have been trying to get him to an allogeneic transplant. However, we would not be oh to get any response with salvage chemotherapy. He has been through all lines of chemotherapy that we have.  He was at the [facility] seeing radiation oncology. He had lab work done. His calcium was 15.5. This is always a very poor prognostic marker for him. Hypercalcemia is always an indicator of progressive disease.  His white cell count was 0.2. His platelet count was 2000. His hemoglobin was 8.4   I spoke to his wife. She said that he is very weak. This typically happens to him when he gets hypercalcemic.  I saw in the emergency room. He clearly was quite sick. He had temperature 101.3. He has some chills.  I talked to him in the emergency room. I explained to him that we had no options left for him. He been through all lines of chemotherapy. His light chain numbers have been going up. He is having recurrent hypercalcemia. He's had progressive bony metastasis on MRI.  I talked him about end-of-life issues. He does not want to be kept alive on machines. However, his wife has other pain is about this. As such, he is ambiguous as to what he would like to have done. He and his wife are going to talk about this.  He has a very strong faith. I think this will clearly help him and his wife during this very difficult time.  He's had no bleeding. He's had no hemoptysis. He's had a little bit of a dry cough.  He's had no diarrhea. He's had no constipation.  He's had no bruises. He's had no leg swelling.  She's had more pain in his bones. I  think that this is directly related to his myeloma.  Currently, his overall performance status is ECOG 3-4.    Past Medical History  Diagnosis Date  . History of radiation therapy 02/07/13- 02/26/13    lower L spine, upper sacrum, 35 gray in 14 fractions  . FH: chemotherapy     Dr. Burney Gauze  . Cancer ([place])   . Multiple myeloma ([place]) dx'd 01/2013  . GERD (gastroesophageal reflux disease)   . Radiation 01/01/2015-01/14/2015    lower lumbar spine/upper sacrum area20 gray   :  Past Surgical History  Procedure Laterality Date  . Portacath placement    . Bone marrow transplant  09/12/13    [facility]  . Kyphoplasty  05/20/14  . Lumbar laminectomy/decompression microdiscectomy N/A 09/25/2014    Procedure: LUMBAR LAMINECTOMY/DECOMPRESSION MICRODISCECTOMY;  Surgeon: Sinclair Ship, MD;  Location: [place];  Service: Orthopedics;  Laterality: N/A;  Lumbar 4-5, L5-S1  decompression  . Esophagogastroduodenoscopy N/A 02/06/2015    Procedure: ESOPHAGOGASTRODUODENOSCOPY (EGD);  Surgeon: Teena Irani, MD;  Location: [facility];  Service: Endoscopy;  Laterality: N/A;  :   Current facility-administered medications:  .  [COMPLETED] 0.9 %  sodium chloride infusion, 1,000 mL, Intravenous, Once, Last Rate: 999 mL/hr at 06/24/15 1730, 1,000 mL at 06/24/15 1730 **FOLLOWED BY** 0.9 %  sodium chloride infusion, 1,000 mL, Intravenous, Continuous, Delora Fuel, MD .  0.9 %  sodium chloride infusion, , Intravenous, Continuous, [name], MD .  0.9 %  sodium chloride infusion, , Intravenous, Once, [name], MD .  bisacodyl (DULCOLAX) EC tablet 5 mg, 5 mg, Oral, Daily PRN, [name], MD .  cefTAZidime (FORTAZ) 2 g in dextrose 5 % 50 mL IVPB, 2 g, Intravenous, Q8H, Thomes Lolling, RPH .  cyclobenzaprine (FLEXERIL) tablet 10 mg, 10 mg, Oral, TID PRN, [name], MD .  famciclovir [facility]) tablet 500 mg, 500 mg, Oral, Daily, [name], MD .  fentaNYL ([facility] - dosed  mcg/hr) patch 75 mcg, 75 mcg, Transdermal, Q48H, [name], MD .  fluconazole (DIFLUCAN) tablet 100 mg, 100 mg, Oral, Daily, [name], MD .  Ipratropium-Albuterol (COMBIVENT) respimat 1 puff, 1 puff, Inhalation, Q6H, [name], MD .  LORazepam (ATIVAN) tablet 0.5 mg, 0.5 mg, Oral, Q6H PRN, [name], MD .  pantoprazole (PROTONIX) EC tablet 40 mg, 40 mg, Oral, Daily, [name], MD .  sodium chloride 0.9 % bolus 1,000 mL, 1,000 mL, Intravenous, Once, Delora Fuel, MD .  sodium chloride 0.9 % bolus 500 mL, 500 mL, Intravenous, Once, Delora Fuel, MD .  temazepam (RESTORIL) capsule 22.5 mg, 22.5 mg, Oral, QHS PRN, [name], MD .  vancomycin (VANCOCIN) IVPB 1000 mg/200 mL premix, 1,000 mg, Intravenous, Once, Delora Fuel, MD  Current outpatient prescriptions:  .  antiseptic oral rinse (BIOTENE) LIQD, 15 mLs by Mouth Rinse route QID., Disp: 1000 mL, Rfl: 4 .  bisacodyl (DULCOLAX) 5 MG EC tablet, Take 5 mg by mouth daily as needed for moderate constipation (constipation). , Disp: , Rfl:  .  clonazePAM (KLONOPIN) 0.5 MG tablet, Take 1 tablet (0.5 mg total) by mouth 3 (three) times daily as needed for anxiety., Disp: 90 tablet, Rfl: 0 .  cyclobenzaprine (FLEXERIL) 10 MG tablet, Take 1 tablet (10 mg total) by mouth 3 (three) times daily as needed for muscle spasms., Disp: 90 tablet, Rfl: 2 .  famciclovir (FAMVIR) 500 MG tablet, Take 1 tablet (500 mg total) by mouth daily., Disp: 30 tablet, Rfl: 12 .  fentaNYL (DURAGESIC - DOSED MCG/HR) 100 MCG/HR, Place 1 patch (100 mcg total) onto the skin every other day., Disp: 15 patch, Rfl: 0 .  fluconazole (DIFLUCAN) 100 MG tablet, Take 1 tablet (100 mg total) by mouth daily., Disp: 30 tablet, Rfl: 2 .  gabapentin (NEURONTIN) 300 MG capsule, Take 2 capsules (600 mg total) by mouth 3 (three) times daily., Disp: 180 capsule, Rfl: 3 .  HYDROmorphone (DILAUDID) 4 MG tablet, Take 4-8 mg by mouth every 4 (four) hours as needed for moderate  pain or severe pain., Disp: , Rfl:  .  Ipratropium-Albuterol (COMBIVENT) 20-100 MCG/ACT AERS respimat, Inhale 1 puff into the lungs every 6 (six) hours. (Patient not taking: Reported on 06/24/2015), Disp: 1 Inhaler, Rfl: 2 .  levofloxacin (LEVAQUIN) 500 MG tablet, Take 1 tablet (500 mg total) by mouth daily. (Patient not taking: Reported on 06/24/2015), Disp: 21 tablet, Rfl: 4 .  lidocaine-prilocaine (EMLA) cream, Apply to affected area once, Disp: 30 g, Rfl: 3 .  LORazepam (ATIVAN) 0.5 MG tablet, Take 1 tablet (0.5 mg total) by mouth every 6 (six) hours as needed (Nausea or vomiting)., Disp: 30 tablet, Rfl: 0 .  metoCLOPramide (REGLAN) 10 MG tablet, Take 1 tablet (10 mg total) by mouth every 6 (six) hours as needed for nausea or vomiting., Disp: 20 tablet, Rfl: 0 .  ondansetron (ZOFRAN  ODT) 8 MG disintegrating tablet, Take 1 tablet (8 mg total) by mouth every 8 (eight) hours as needed for nausea or vomiting., Disp: 30 tablet, Rfl: 2 .  pantoprazole (PROTONIX) 40 MG tablet, Take 1 tablet (40 mg total) by mouth daily., Disp: 30 tablet, Rfl: 5 .  prochlorperazine (COMPAZINE) 10 MG tablet, Take 1 tablet (10 mg total) by mouth every 6 (six) hours as needed (Nausea or vomiting)., Disp: 30 tablet, Rfl: 1 .  saccharomyces boulardii (FLORASTOR) 250 MG capsule, Take 1 capsule (250 mg total) by mouth 2 (two) times daily., Disp: 60 capsule, Rfl: 3 .  temazepam (RESTORIL) 22.5 MG capsule, Take 1 capsule (22.5 mg total) by mouth at bedtime as needed for sleep., Disp: 30 capsule, Rfl: 0:  . famciclovir  500 mg Oral Daily  . fentaNYL  75 mcg Transdermal Q48H  . fluconazole  100 mg Oral Daily  . Ipratropium-Albuterol  1 puff Inhalation Q6H  . pantoprazole  40 mg Oral Daily  :  No Known Allergies:  Family History  Problem Relation Age of Onset  . Diabetic kidney disease Mother   . Hypertension Mother   . Heart attack Mother   . Kidney failure Mother   . Coronary artery disease Mother   . HIV Father    :  Social History   Social History  . Marital Status: Married    Spouse Name: N/A  . Number of Children: N/A  . Years of Education: N/A   Occupational History  . Not on file.   Social History Main Topics  . Smoking status: Former Smoker -- 1.00 packs/day for 15 years    Types: Cigarettes    Start date: 06/29/1988    Quit date: 06/29/2003  . Smokeless tobacco: Never Used     Comment: quit smoking 10 years ago  . Alcohol Use: No  . Drug Use: No  . Sexual Activity: No   Other Topics Concern  . Not on file   Social History Narrative  :  Pertinent items are noted in HPI.  Exam: Patient Vitals for the past 24 hrs:  BP Temp Temp src Pulse Resp SpO2 Weight  06/24/15 1752 103/59 mmHg - - (!) 128 26 100 % -  06/24/15 1720 130/67 mmHg - - (!) 126 (!) 28 - -  06/24/15 1655 131/68 mmHg - - (!) 128 (!) 30 100 % -  06/24/15 1647 148/75 mmHg 100.1 F (37.8 C) Oral (!) 130 15 100 % 195 lb (88.451 kg)    ill-appearing African-American male. Oral exam shows no mucositis. Oral mucosa is slightly dry. He has no thrush. He has no scleral icterus. He has no adenopathy in the neck. Lungs show some slight decrease at the bases. Cardiac exam tachycardic but regular. There are no murmurs, rubs or bruits. Abdomen is soft. Bowel sounds are decreased. He has no fluid wave. There is no palpable liver or spleen tip. Extremities shows some muscle atrophy in upper and lower extremities. He has decent range of motion of his joints. Skin exam shows no rashes, ecchymoses or petechia. Neurological exam shows no focal neurological deficits.    Recent Labs  06/24/15 1723  WBC <0.1*  HGB 7.9*  HCT 22.9*  PLT <5*    Recent Labs  06/24/15 1518  NA 133*  K 3.6  CO2 28  GLUCOSE 148*  BUN 21.3  CREATININE 1.1  CALCIUM 15.5*    Blood smear review:  None  Pathology: None     Assessment  and Plan:  Mark [name] is a 44 year old African-American male with refractory light chain myeloma. He has  hypercalcemia. He is pancytopenic from his last cycle of chemotherapy.  Overall, his prognosis is incredibly poor. I really think that his prognosis will be no more than 3-4 weeks. We will have nothing else to treat him with for his myeloma. We have basically treated him with very aggressive therapy. Nothing has really worked post transplant.  I taught him and his wife at length. I just want to focus on his comfort. I think that he really needs respect and dignity. I really think this would be the best for him. Trying to keep him alive on machines would not improve his quality of life. I think that if he were to go on life-support, that he would never come off. If he had a be Alive by CPR, I think he would fracture ribs and given his thrombocytopenia, he would bleed internally that we cannot stop.  I will readdress his CODE STATUS in the morning. I want he and his wife to have some time to talk about the situation. I know this is very hard for her. They have a 4 year old son.  We will give him IV fluids. I'll give him some calcitonin. I will also by give him some Zometa or Aredia.  We will give him some platelets. He likely will need some blood.  I think once things settle down a little bit, then we can discuss hospice which I think really will be a very important part of taking care of him with his end-stage myeloma.  I suspect that he probably will be hospitalized for least 4-5 days.  Pete E.

## 2015-06-24 NOTE — Progress Notes (Signed)
Seen patient for scheduled BD. He does not wish to use on a schedule and states he uses his inhaler at home only on an as needed basis. He is aware that he may request a nebulizer if he desires, and to call. RT treatment assessment protocol completed. Orders changed to prn per acuity score and patient request. RT will continue to follow.

## 2015-06-24 NOTE — ED Notes (Signed)
Dr Martha Clan informed of pts platelet count

## 2015-06-24 NOTE — ED Notes (Addendum)
Unable to get urine sample. Wife had pt sit at end of bed while rn went to get antibiotics. Pt could not hold his head up or keep eyes open. md made aware unable to get urine sample.

## 2015-06-24 NOTE — ED Notes (Signed)
Pt being sent by [facility] d/t Calcium 15.5, WBC 0.5, and PLT 2.  Hx of multiple myeloma.

## 2015-06-24 NOTE — Progress Notes (Signed)
Please see the Nurse Progress Note in the MD Initial Consult Encounter for this patient. 

## 2015-06-24 NOTE — Telephone Encounter (Signed)
Critical Values WBC 0.2 Neutrophils 0.2 Platelets 2  Dr Marin Olp notified. He will place orders.

## 2015-06-24 NOTE — Progress Notes (Signed)
Radiation Oncology         [phone] ________________________________  Name: Mark [name] MRN: [id number]  Date: 06/24/2015  DOB: 1970/10/26  Re-Evaluation Visit Note  CC: No PCP Per Patient  [name], MD   Diagnosis: Refractory multiple myeloma  Interval Since Last Radiation:  5 months  01/01/2015-01/14/2015. 20 grays in 10 fractions for recurrence in the previous area of treatment in the lower lumbar spine/upper sacrum area and associated pain.  02/07/2013-02/26/2013. 35 Gy in 14 fractions to the lower lumbar spine and upper sacrum.  Narrative:  The patient returns today for a re-evaluation. He is seen out of the courtesy of Dr. Marin Olp for additional palliative radiotherapy. The patient was recently admitted to the hospital for hypercalcemia and complaining of neck pain. MRI of the cervical spine showed a pathologic fracture at C5 with an enhancing epidural tumor on the right side without cord compression. The patient complains of neck and lower back pain with the neck pain being worse. He also complains of weakness.  ALLERGIES:  has No Known Allergies.  Meds: Current Outpatient Prescriptions  Medication Sig Dispense Refill  . antiseptic oral rinse (BIOTENE) LIQD 15 mLs by Mouth Rinse route QID. 1000 mL 4  . bisacodyl (DULCOLAX) 5 MG EC tablet Take 5 mg by mouth daily as needed for moderate constipation (constipation).     . clonazePAM (KLONOPIN) 0.5 MG tablet Take 1 tablet (0.5 mg total) by mouth 3 (three) times daily as needed for anxiety. 90 tablet 0  . cyclobenzaprine (FLEXERIL) 10 MG tablet Take 1 tablet (10 mg total) by mouth 3 (three) times daily as needed for muscle spasms. 90 tablet 2  . famciclovir (FAMVIR) 500 MG tablet Take 1 tablet (500 mg total) by mouth daily. 30 tablet 12  . fentaNYL (DURAGESIC - DOSED MCG/HR) 100 MCG/HR Place 1 patch (100 mcg total) onto the skin every other day. 15 patch 0  . fluconazole (DIFLUCAN) 100 MG tablet Take 1 tablet (100 mg  total) by mouth daily. 30 tablet 2  . gabapentin (NEURONTIN) 300 MG capsule Take 2 capsules (600 mg total) by mouth 3 (three) times daily. 180 capsule 3  . HYDROmorphone (DILAUDID) 4 MG tablet Take 4-8 mg by mouth every 4 (four) hours as needed for moderate pain or severe pain.    Marland Kitchen LORazepam (ATIVAN) 0.5 MG tablet Take 1 tablet (0.5 mg total) by mouth every 6 (six) hours as needed (Nausea or vomiting). 30 tablet 0  . metoCLOPramide (REGLAN) 10 MG tablet Take 1 tablet (10 mg total) by mouth every 6 (six) hours as needed for nausea or vomiting. 20 tablet 0  . ondansetron (ZOFRAN ODT) 8 MG disintegrating tablet Take 1 tablet (8 mg total) by mouth every 8 (eight) hours as needed for nausea or vomiting. 30 tablet 2  . pantoprazole (PROTONIX) 40 MG tablet Take 1 tablet (40 mg total) by mouth daily. 30 tablet 5  . prochlorperazine (COMPAZINE) 10 MG tablet Take 1 tablet (10 mg total) by mouth every 6 (six) hours as needed (Nausea or vomiting). 30 tablet 1  . saccharomyces boulardii (FLORASTOR) 250 MG capsule Take 1 capsule (250 mg total) by mouth 2 (two) times daily. 60 capsule 3  . Ipratropium-Albuterol (COMBIVENT) 20-100 MCG/ACT AERS respimat Inhale 1 puff into the lungs every 6 (six) hours. (Patient not taking: Reported on 06/24/2015) 1 Inhaler 2  . levofloxacin (LEVAQUIN) 500 MG tablet Take 1 tablet (500 mg total) by mouth daily. (Patient not taking: Reported on 06/24/2015)  21 tablet 4  . lidocaine-prilocaine (EMLA) cream Apply to affected area once 30 g 3  . temazepam (RESTORIL) 22.5 MG capsule Take 1 capsule (22.5 mg total) by mouth at bedtime as needed for sleep. 30 capsule 0   No current facility-administered medications for this encounter.    Physical Findings: The patient is in no acute distress.   height is 5' 11" (1.803 m) and weight is 195 lb (88.451 kg). His oral temperature is 98.9 F (37.2 C). His blood pressure is 105/69 and his pulse is 129. His oxygen saturation is 100%.   The  patient presents to the clinic in a wheelchair. Too weak to stand and walk. Very lethargic. Responds appropriately to questions.  able to raise both lower extremities off of the floor. Tachycardic.  Lab Findings: Lab Results  Component Value Date   WBC <0.2* 06/18/2015   HGB 10.7* 06/18/2015   HCT 30.7* 06/18/2015   MCV 86 06/18/2015   PLT 8* 06/18/2015    Radiographic Findings: Mr Cervical Spine W Wo Contrast  06/15/2015  CLINICAL DATA:  Multiple myeloma in relapse. EXAM: MRI CERVICAL SPINE WITHOUT AND WITH CONTRAST TECHNIQUE: Multiplanar and multiecho pulse sequences of the cervical spine, to include the craniocervical junction and cervicothoracic junction, were obtained according to standard protocol without and with intravenous contrast. CONTRAST:  78m MULTIHANCE GADOBENATE DIMEGLUMINE 529 MG/ML IV SOLN COMPARISON:  PET 02/26/2015 FINDINGS: Pathologic fracture C5 vertebral body with moderate loss of vertebral body height. There is diffuse enhancement of the vertebral bodies as well as enhancement in the ventral epidural soft tissues on the right compatible with tumor extension into the epidural space. Ill-defined bone marrow abnormalities at C7, T1, T2, T3 show slight enhancement and are most consistent with tumor. There is also enhancing tumor involving the C7 spinous process and adjacent soft tissues. No other areas of epidural tumor. Spinal cord signal normal.  No cord edema.  No cord compression. Negative for disc protrusion. 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 No cord compression. Multiple additional areas of bone marrow involvement by tumor. Electronically Signed   By: CFranchot GalloM.D.   On: 06/15/2015 16:05   Mr Lumbar Spine W Wo Contrast  06/15/2015  CLINICAL DATA:  Multiple myeloma in relapse.  Back pain EXAM: MRI LUMBAR SPINE WITHOUT AND WITH CONTRAST TECHNIQUE: Multiplanar and multiecho pulse sequences of the lumbar spine were  obtained without and with intravenous contrast. CONTRAST:  16 mL MultiHance IV COMPARISON:  Lumbar MRI 12/01/2014 FINDINGS: Post radiation fatty changes in the marrow at L4, L5, and sacrum. Bone marrow throughout the L3 vertebral body and above shows interval change with patchy small areas of hypo intensity on T1. Several of these areas enhance especially involving the L2 vertebral body and L3 vertebral body. This is compatible with progression of myeloma. There is progression of tumor in the right-side of L5 extending into the transverse process. 6 mm lesion right iliac bone unchanged. Pathologic fracture involving T12 is unchanged. Posterior decompression at L5-S1 unchanged. Chronic degenerative changes throughout the lumbar spine are stable with mild spinal stenosis at L2-3, L3-4. IMPRESSION: Progression of bone marrow changes consistent with multiple myeloma from T12 through L3 as well as in the right L5 vertebral body extending into the transverse process. Chronic pathologic fracture T12.  No new fracture identified. Electronically Signed   By: CFranchot GalloM.D.   On: 06/15/2015 16:18   Dg Chest Port 1 View  06/10/2015  CLINICAL DATA:  Central catheter placement.  Multiple myeloma. EXAM: PORTABLE CHEST 1 VIEW COMPARISON:  May 12, 2015; chest CT February 27, 2015 FINDINGS: Left-sided central catheter tip is in the superior cava. Port-A-Cath tip is also in the superior vena cava. No pneumothorax. There is a nodular opacity in the periphery of the left mid lung measuring 4.4 x 4.3 cm which has become larger compared to recent prior study. Prior CT shows this lesion arising from the pleura and involving the adjacent ribs, likely secondary to the multiple myeloma. No new pleural/parenchymal lung opacity is seen. The heart size and pulmonary vascularity normal. No adenopathy. There is lucency in each lateral clavicle, likely due to the multiple myeloma. There is a lytic area in the right lateral seventh rib  consistent with multiple myeloma. Multiple small lytic lesions in the scapula bilaterally are again noted. The multiple lesions noted in the thoracic spine on CT are not well seen on this portable chest radiographic examination. IMPRESSION: Central catheters have tips in the superior vena cava. No pneumothorax. Increase in size of mass arising from the left lateral pleura. Multiple lytic bone lesions consistent with multiple myeloma present. No airspace consolidation. Cardiac silhouette within normal limits. Electronically Signed   By: Lowella Grip III M.D.   On: 06/10/2015 14:35    Impression:  Refractory multiple myeloma. The patient is a good candidate for palliative radiation to the lower cervical spine. I will have to review his previous treatments to the lumbar spine to see if he could receive additional treatment to this area. Labs earlier show significant hypercalcemia and  pancytopenia. He will be admitted to the hospital for further management.   Plan: Attempt CT simulation early next week depending on his clinical course in the hospital. Patient is a direct admit to the hospital.  ____________________________________  Blair Promise, PhD, MD  This document serves as a record of services personally performed by Gery Pray, MD. It was created on his behalf by Darcus Austin, a trained medical scribe. The creation of this record is based on the scribe's personal observations and the provider's statements to them. This document has been checked and approved by the attending provider.

## 2015-06-25 DIAGNOSIS — R319 Hematuria, unspecified: Secondary | ICD-10-CM

## 2015-06-25 LAB — COMPREHENSIVE METABOLIC PANEL
ALBUMIN: 2.1 g/dL — AB (ref 3.5–5.0)
ALK PHOS: 70 U/L (ref 38–126)
ALT: 35 U/L (ref 17–63)
ANION GAP: 7 (ref 5–15)
AST: 40 U/L (ref 15–41)
BUN: 17 mg/dL (ref 6–20)
CALCIUM: 11.6 mg/dL — AB (ref 8.9–10.3)
CHLORIDE: 103 mmol/L (ref 101–111)
CO2: 28 mmol/L (ref 22–32)
Creatinine, Ser: 0.93 mg/dL (ref 0.61–1.24)
GFR calc Af Amer: >60 mL/min (ref >60)
GFR calc non Af Amer: >60 mL/min (ref >60)
GLUCOSE: 143 mg/dL — AB (ref 65–99)
Potassium: 3.5 mmol/L (ref 3.5–5.1)
SODIUM: 138 mmol/L (ref 135–145)
Total Bilirubin: 1.5 mg/dL — ABNORMAL HIGH (ref 0.3–1.2)
Total Protein: 5.4 g/dL — ABNORMAL LOW (ref 6.5–8.1)

## 2015-06-25 LAB — PREPARE PLATELET PHERESIS: Unit division: 0

## 2015-06-25 LAB — CBC
HEMATOCRIT: 19.2 % — AB (ref 39.0–52.0)
HEMOGLOBIN: 6.5 g/dL — AB (ref 13.0–17.0)
MCH: 29.3 pg (ref 26.0–34.0)
MCHC: 33.9 g/dL (ref 30.0–36.0)
MCV: 86.5 fL (ref 78.0–100.0)
Platelets: 10 10*3/uL — CL (ref 150–400)
RBC: 2.22 MIL/uL — AB (ref 4.22–5.81)
RDW: 19.2 % — ABNORMAL HIGH (ref 11.5–15.5)
WBC: 0.3 10*3/uL — CL (ref 4.0–10.5)

## 2015-06-25 LAB — URINALYSIS, ROUTINE W REFLEX MICROSCOPIC
Bilirubin Urine: NEGATIVE
Glucose, UA: NEGATIVE mg/dL
Ketones, ur: NEGATIVE mg/dL
LEUKOCYTES UA: NEGATIVE
Nitrite: NEGATIVE
Protein, ur: NEGATIVE mg/dL
SPECIFIC GRAVITY, URINE: 1.01 (ref 1.005–1.030)
pH: 6 (ref 5.0–8.0)

## 2015-06-25 LAB — URINE MICROSCOPIC-ADD ON
BACTERIA UA: NONE SEEN
WBC, UA: NONE SEEN WBC/hpf (ref 0–5)

## 2015-06-25 LAB — PREPARE RBC (CROSSMATCH)

## 2015-06-25 MED ORDER — SODIUM CHLORIDE 0.9 % IV SOLN
2000.0000 mg | Freq: Once | INTRAVENOUS | Status: AC
  Administered 2015-06-25: 2000 mg via INTRAVENOUS
  Filled 2015-06-25: qty 2000

## 2015-06-25 MED ORDER — FUROSEMIDE 10 MG/ML IJ SOLN
20.0000 mg | Freq: Once | INTRAMUSCULAR | Status: AC
  Administered 2015-06-25: 20 mg via INTRAVENOUS
  Filled 2015-06-25: qty 2

## 2015-06-25 MED ORDER — METRONIDAZOLE IN NACL 5-0.79 MG/ML-% IV SOLN
500.0000 mg | Freq: Four times a day (QID) | INTRAVENOUS | Status: DC
  Administered 2015-06-25 – 2015-06-26 (×4): 500 mg via INTRAVENOUS
  Filled 2015-06-25 (×4): qty 100

## 2015-06-25 MED ORDER — SODIUM CHLORIDE 0.9 % IV SOLN
Freq: Once | INTRAVENOUS | Status: AC
  Administered 2015-06-25: 12:00:00 via INTRAVENOUS

## 2015-06-25 NOTE — Progress Notes (Signed)
Oncology Short Note  Called by Rn for blood cultures positive for Gram positive anaerobes with some low grade fever despite ceftazidime. Patient neutropenic.  _added metronidazole. F/u final culture results ?abd anerobes. -continue other mx per Dr Marin Olp.  Sullivan Lone MD MS

## 2015-06-25 NOTE — Progress Notes (Signed)
Mark [name] is about the same. He did rest last night. He is having some pain issues. He does have a lot of bone disease. Chest x-ray did not show any obvious pneumonia. This is no surprise as he has no white cells. Cultures are not back yet. He is on [place].  He got 1 unit of platelets. His platelet count went up to 10,000. I will give him another unit of platelets.  His hemoglobin is 6.5. I will give him 2 units of blood. His metabolic panel still not back yet. Hopefully, his calcium has come down a little bit.  I did have a long talk with him this morning about CODE STATUS. His wife was not with this. He did talk to her about this yesterday. He does not want to be kept alive on machines. I agree with this. I think that if he were to be resuscitated, he was sustained internal bleeding because of bone fractures from his myeloma. With his thrombocytopenia, I think he would bleed internally. I think if he even survived a resuscitation attempt, he would not come off a ventilator because he is so weak. He understands all this. As such, he is a DO NOT RESUSCITATE. I reassured him that we'll continue to work hard to improve his quality of life so that he would be able to go home.  He does not have much of an appetite. This is understandable. He's had no nausea or vomiting.  He has had some hematuria. He's had no hemoptysis.  [name], his blood pressure is 152. His temperature is 99.3. His pulse is 132. Oral exam shows slightly dry oral mucosa. He has no adenopathy in the neck. Lungs showed decent breath sounds bilaterally. He has no wheezes. Cardiac exam regular rate and rhythm with no murmurs, rubs or bruits. Abdomen is soft. He has good bowel sounds. There is no fluid wave. There is no palpable liver or spleen tip. Extremities shows no clubbing, cyanosis or edema. Neurological exam is nonfocal.  Mark [name] has refractory myeloma. Because this, he has recurrent hypercalcemia. We'll have to see what  his labs look like.  We are focused on comfort care. We will need to be aggressive with pain control. Hopefully the transfusions will help make her feel a little bit better.  I spent about 40 minutes with him this morning. I just really feel bad for him. I'm sure that he will have quite a few guests for Thanksgiving.  As always, I'm praying hard for him and his family.   Mark E.  1 Thessalonians 5:16-18

## 2015-06-25 NOTE — Progress Notes (Signed)
CRITICAL VALUE ALERT  Critical value received:  Positive blood cultures, gram + cocci clusters  Date of notification:  06/25/15  Time of notification:  1212  Critical value read back:Yes.    Nurse who received alert:  Sandie Ano  MD notified (1st page):  Dr. Irene Limbo  Time of first page:  1227  MD notified (2nd page):  Time of second page:  Responding MD:  Dr. Irene Limbo  Time MD responded:  1228

## 2015-06-25 NOTE — Progress Notes (Signed)
CRITICAL VALUE ALERT  Critical value received:  HGB 6.5  Date of notification: 06/25/15  Time of notification:  0630  Critical value read back:Yes  Nurse who received alert:  Azzie Glatter, RN; [phone]  MD notified (1st page):  Ennever  Time of first page: [phone]  MD notified (2nd page):n/a Time of second page: n/a  Responding [id number]  Time MD responded:  [phone] ( will come up to floor and write blood orders)

## 2015-06-26 ENCOUNTER — Ambulatory Visit: Payer: BLUE CROSS/BLUE SHIELD

## 2015-06-26 ENCOUNTER — Ambulatory Visit: Payer: BLUE CROSS/BLUE SHIELD | Admitting: Hematology & Oncology

## 2015-06-26 ENCOUNTER — Other Ambulatory Visit: Payer: BLUE CROSS/BLUE SHIELD

## 2015-06-26 DIAGNOSIS — R5081 Fever presenting with conditions classified elsewhere: Secondary | ICD-10-CM

## 2015-06-26 DIAGNOSIS — D709 Neutropenia, unspecified: Secondary | ICD-10-CM

## 2015-06-26 DIAGNOSIS — R7881 Bacteremia: Secondary | ICD-10-CM

## 2015-06-26 DIAGNOSIS — B958 Unspecified staphylococcus as the cause of diseases classified elsewhere: Secondary | ICD-10-CM

## 2015-06-26 LAB — COMPREHENSIVE METABOLIC PANEL
ALK PHOS: 66 U/L (ref 38–126)
ALT: 32 U/L (ref 17–63)
AST: 40 U/L (ref 15–41)
Albumin: 2.2 g/dL — ABNORMAL LOW (ref 3.5–5.0)
Anion gap: 7 (ref 5–15)
BILIRUBIN TOTAL: 1.5 mg/dL — AB (ref 0.3–1.2)
BUN: 14 mg/dL (ref 6–20)
CALCIUM: 10.9 mg/dL — AB (ref 8.9–10.3)
CO2: 29 mmol/L (ref 22–32)
CREATININE: 0.85 mg/dL (ref 0.61–1.24)
Chloride: 105 mmol/L (ref 101–111)
Glucose, Bld: 138 mg/dL — ABNORMAL HIGH (ref 65–99)
Potassium: 3.8 mmol/L (ref 3.5–5.1)
Sodium: 141 mmol/L (ref 135–145)
TOTAL PROTEIN: 5.5 g/dL — AB (ref 6.5–8.1)

## 2015-06-26 LAB — CBC
HEMATOCRIT: 23.6 % — AB (ref 39.0–52.0)
Hemoglobin: 7.9 g/dL — ABNORMAL LOW (ref 13.0–17.0)
MCH: 27.9 pg (ref 26.0–34.0)
MCHC: 33.5 g/dL (ref 30.0–36.0)
MCV: 83.4 fL (ref 78.0–100.0)
PLATELETS: 21 10*3/uL — AB (ref 150–400)
RBC: 2.83 MIL/uL — ABNORMAL LOW (ref 4.22–5.81)
RDW: 20.2 % — AB (ref 11.5–15.5)
WBC: 0.3 10*3/uL — CL (ref 4.0–10.5)

## 2015-06-26 LAB — PREPARE PLATELET PHERESIS: UNIT DIVISION: 0

## 2015-06-26 LAB — URINE CULTURE: CULTURE: NO GROWTH

## 2015-06-26 MED ORDER — FENTANYL 75 MCG/HR TD PT72
150.0000 ug | MEDICATED_PATCH | TRANSDERMAL | Status: DC
  Administered 2015-06-26 – 2015-06-28 (×2): 150 ug via TRANSDERMAL
  Filled 2015-06-26 (×2): qty 2

## 2015-06-26 MED ORDER — SODIUM CHLORIDE 0.9 % IV SOLN
1000.0000 mL | INTRAVENOUS | Status: DC
  Administered 2015-06-28 – 2015-06-30 (×4): 1000 mL via INTRAVENOUS

## 2015-06-26 MED ORDER — VANCOMYCIN HCL IN DEXTROSE 1-5 GM/200ML-% IV SOLN
1000.0000 mg | Freq: Three times a day (TID) | INTRAVENOUS | Status: DC
  Administered 2015-06-26 – 2015-07-02 (×18): 1000 mg via INTRAVENOUS
  Filled 2015-06-26 (×20): qty 200

## 2015-06-26 NOTE — Progress Notes (Signed)
ANTIBIOTIC CONSULT NOTE - INITIAL  Pharmacy Consult for [facility] & Vancomycin Indication: febrile neutropenia  No Known Allergies  Patient Measurements: Height: _0  (180.3 cm) Weight: 194 lb 0.1 oz (88 kg) IBW/kg (Calculated) : 75.3  Vital Signs: Temp: 99.7 F (37.6 C) (11/25 0535) Temp Source: Oral (11/25 0535) BP: 105/57 mmHg (11/25 0535) Pulse Rate: 113 (11/25 0535) Intake/Output from previous day: 11/24 0701 - 11/25 0700 In: 3782.3 [P.O.:240; I.V.:2273.3; Blood:969; IV Piggyback:300] Out: 2050 [Urine:2050] Intake/Output from this shift:    Labs:  Recent Labs  06/24/15 1518 06/24/15 1723 06/25/15 0552 06/26/15 0520  WBC  --  <0.1* 0.3* 0.3*  HGB  --  7.9* 6.5* 7.9*  PLT  --  <5* 10* 21*  CREATININE 1.1  --  0.93 0.85   Estimated Creatinine Clearance: 119.3 mL/min (by C-G formula based on Cr of 0.85). No results for input(s): VANCOTROUGH, VANCOPEAK, VANCORANDOM, GENTTROUGH, GENTPEAK, GENTRANDOM, TOBRATROUGH, TOBRAPEAK, TOBRARND, AMIKACINPEAK, AMIKACINTROU, AMIKACIN in the last 72 hours.   Microbiology: Recent Results (from the past 720 hour(s))  Culture, blood (routine x 2)     Status: None (Preliminary result)   Collection Time: 06/24/15  5:24 PM  Result Value Ref Range Status   Specimen Description BLOOD PORTA CATH  Final   Special Requests BOTTLES DRAWN AEROBIC AND ANAEROBIC 5 ML  Final   Culture  Setup Time   Final    GRAM POSITIVE COCCI IN CLUSTERS IN BOTH AEROBIC AND ANAEROBIC BOTTLES CRITICAL RESULT CALLED TO, READ BACK BY AND VERIFIED WITH: D ALAVER 06/25/15 @ 1207 M VESTAL    Culture   Final    GRAM POSITIVE COCCI Performed at [facility]    Report Status PENDING  Incomplete  Culture, blood (routine x 2)     Status: None (Preliminary result)   Collection Time: 06/24/15  6:15 PM  Result Value Ref Range Status   Specimen Description BLOOD RIGHT ANTECUBITAL  Final   Special Requests BOTTLES DRAWN AEROBIC AND ANAEROBIC 5 ML  Final   Culture   Final    NO GROWTH < 12 HOURS Performed at [facility]    Report Status PENDING  Incomplete   Medical History: Past Medical History  Diagnosis Date  . History of radiation therapy 02/07/13- 02/26/13    lower L spine, upper sacrum, 35 gray in 14 fractions  . FH: chemotherapy     Dr. Burney Gauze  . Cancer ([place])   . Multiple myeloma ([place]) dx'd 01/2013  . GERD (gastroesophageal reflux disease)   . Radiation 01/01/2015-01/14/2015    lower lumbar spine/upper sacrum area20 gray    Assessment: 44 yo M with refractory myeloma admitted with hypercalcemia, fever, & pancytopenia.   Pharmacy asked to start [facility] 11/23 for febrile neutropenia.  He received Vancomycin x1 in ED.  Discussed with Dr Marin Olp and will not continue Vancomycin on admit.    Vancomycin 2gm x1 ordered 11/24 per MD, then continue Vancomycin 11/24 per pharmacy dosing.  11/23 >> famvir/flucon PTA resumed 11/23 >>Fortaz  >> 11/23 >> Vanc >> 11/24 >> Flagyl >> 11/25  11/23 blood x2: GPC clusters in anaerobic bottle only from [place], peripheral = NGTD 11/24 urine: ngtd   Goal of Therapy:  Vancomycin trough 15-20 mcg/ml  Plan:   No change Fortaz 2gm IV q8h  Vancomycin 1gm q8hr, trough at steady state  Minda Ditto PharmD Pager [phone] 06/26/2015, 8:37 AM

## 2015-06-26 NOTE — Progress Notes (Signed)
Nutrition Brief Note  Pt identified as at nutrition risk on the Malnutrition Screen Tool  Chart reviewed. Pt now transitioning to comfort care per MD. No further nutrition interventions warranted at this time.  Please consult as needed.   Clayton Bibles, MS, RD, LDN Pager: [phone] After Hours Pager: [phone]

## 2015-06-26 NOTE — Progress Notes (Signed)
Mark [name] is growing gram-positive cocci in his blood. He is on [place]. I will add vancomycin.he did have a temperature of 102.1 yesterday. He is still neutropenic. His white cell count 0.3. He did get Neulasta in the office  Last week so this should be helping right now.  He did get 2 units of blood and one unit of platelets yesterday. Platelet count is 21,000. His hemoglobin is 7.9.  His calcium is now down to 10.9.this is somewhat encouraging.  He did not eat much yesterday. There was no nausea or vomiting. He did not have diarrhea.  He still says there is some blood in the urine. I may want to check a urinalysis.  He still is hurting. I will increase his fentanyl patch.I'll put him up to 150g every couple days.   He's had no rashes. There does not appear to be any leg swelling.  [name], his his vital signs are temperature 99.7. Pulse 113. Blood pressure 105/57. Head and neck exam shows no ocular or oral lesions. He has no adenopathy in the neck. Lungs are clear. Maybe some slight decrease at the bases. Cardiac exam tachycardic but regular. There are no murmurs, rubs or bruits. Abdomen is soft. His decent bowel sounds. There is no guarding or rebound tenderness. Extremities shows no clubbing, cyanosis or edema. Neurological exam is nonfocal. Skin exam shows no rashes.  Mark [name] has gram-positive cocci in his blood. We will await the sensitivities and identification of the organism. Again, he will be started on vancomycin.  He does have a Port-A-Cath in. We will have to be cautious because of the Port-A-Cath.  At least his calcium is better. This is encouraging.  He is still very weak. He is not ready for discharge given that he is neutropenic with bacteremia.  I appreciate and am thankful for all the outstanding care that he is getting from the staff on [address].  Ramond Dial 2:6-7

## 2015-06-26 NOTE — Care Management Note (Signed)
Case Management Note  Patient Details  Name: [place] Routson MRN: [id number] Date of Birth: 1971-06-25  Subjective/Objective:          Anemia and bleeding          Action/Plan:Date: June 26, 2015 Chart reviewed for concurrent status and case management needs. Will continue to follow patient for changes and needs: Velva Harman, RN, BSN, [place]   [phone]   Expected Discharge Date:   (unknown)               Expected Discharge Plan:  Home/Self Care  In-House Referral:  NA  Discharge planning Services  CM Consult  Post Acute Care Choice:  NA Choice offered to:  NA  DME Arranged:  N/A DME Agency:  NA  HH Arranged:  NA HH Agency:  NA  Status of Service:  In process, will continue to follow  Medicare Important Message Given:    Date Medicare IM Given:    Medicare IM give by:    Date Additional Medicare IM Given:    Additional Medicare Important Message give by:     If discussed at [place] of Stay Meetings, dates discussed:    Additional Comments:  Leeroy Cha, RN 06/26/2015, 10:21 AM

## 2015-06-26 NOTE — Progress Notes (Signed)
CSW received referral to please follow for discharge needs. Pt will probably need hospice care upon discharge.  CSW reviewed chart and MD note did not identify discharge plans at this time.  CSW will await further guidance from MD regarding discharge plan.   CSW to continue to follow.   Alison Murray, MSW, [place] [facility] [phone]

## 2015-06-27 DIAGNOSIS — D61818 Other pancytopenia: Secondary | ICD-10-CM

## 2015-06-27 DIAGNOSIS — R52 Pain, unspecified: Secondary | ICD-10-CM

## 2015-06-27 DIAGNOSIS — C9000 Multiple myeloma not having achieved remission: Secondary | ICD-10-CM

## 2015-06-27 LAB — CBC
HEMATOCRIT: 22.7 % — AB (ref 39.0–52.0)
HEMOGLOBIN: 7.5 g/dL — AB (ref 13.0–17.0)
MCH: 27.4 pg (ref 26.0–34.0)
MCHC: 33 g/dL (ref 30.0–36.0)
MCV: 82.8 fL (ref 78.0–100.0)
Platelets: 11 10*3/uL — CL (ref 150–400)
RBC: 2.74 MIL/uL — ABNORMAL LOW (ref 4.22–5.81)
RDW: 20.9 % — AB (ref 11.5–15.5)
WBC: 0.4 10*3/uL — CL (ref 4.0–10.5)

## 2015-06-27 LAB — COMPREHENSIVE METABOLIC PANEL
ALBUMIN: 2.2 g/dL — AB (ref 3.5–5.0)
ALT: 28 U/L (ref 17–63)
ANION GAP: 8 (ref 5–15)
AST: 34 U/L (ref 15–41)
Alkaline Phosphatase: 56 U/L (ref 38–126)
BUN: 10 mg/dL (ref 6–20)
CALCIUM: 11.3 mg/dL — AB (ref 8.9–10.3)
CO2: 28 mmol/L (ref 22–32)
Chloride: 102 mmol/L (ref 101–111)
Creatinine, Ser: 0.81 mg/dL (ref 0.61–1.24)
GFR calc Af Amer: >60 mL/min (ref >60)
GFR calc non Af Amer: >60 mL/min (ref >60)
GLUCOSE: 99 mg/dL (ref 65–99)
Potassium: 3.6 mmol/L (ref 3.5–5.1)
SODIUM: 138 mmol/L (ref 135–145)
Total Bilirubin: 1.1 mg/dL (ref 0.3–1.2)
Total Protein: 5.4 g/dL — ABNORMAL LOW (ref 6.5–8.1)

## 2015-06-27 LAB — CULTURE, BLOOD (ROUTINE X 2)

## 2015-06-27 MED ORDER — SODIUM CHLORIDE 0.9 % IV SOLN: Freq: Once | INTRAVENOUS | Status: DC

## 2015-06-27 MED ORDER — ACETAMINOPHEN 325 MG PO TABS
650.0000 mg | ORAL_TABLET | Freq: Once | ORAL | Status: AC
  Administered 2015-06-27: 650 mg via ORAL
  Filled 2015-06-27: qty 2

## 2015-06-27 MED ORDER — DIPHENHYDRAMINE HCL 25 MG PO CAPS
25.0000 mg | ORAL_CAPSULE | Freq: Once | ORAL | Status: AC
  Administered 2015-06-27: 25 mg via ORAL
  Filled 2015-06-27: qty 1

## 2015-06-27 NOTE — Progress Notes (Signed)
Tyquan Eilers   DOB:May 28, 1971   [id number]    Subjective:  Feeling a little better today.  Complains of dry mouth. Has eaten some. Feels "less weak today."    Objective:  Filed Vitals:   06/26/15 2037 06/27/15 0502  BP: 108/60 121/82  Pulse: 67 90  Temp: 100.1 F (37.8 C) 99.1 F (37.3 C)  Resp: 18 18     Intake/Output Summary (Last 24 hours) at 06/27/15 1134 Last data filed at 06/26/15 2000  Gross per 24 hour  Intake    600 ml  Output   1600 ml  Net  -1000 ml    GENERAL:alert, no distress and comfortable SKIN: skin color, texture, turgor are normal, no rashes or significant lesions EYES: normal, Conjunctiva are pink and non-injected, sclera clear OROPHARYNX:no exudate, no erythema and lips, buccal mucosa, and tongue normal  NECK: supple, thyroid normal size, non-tender, without nodularity LYMPH:  no palpable lymphadenopathy in the cervical, axillary or inguinal LUNGS: clear to auscultation and percussion with normal breathing effort HEART: regular rate & rhythm and no murmurs and no lower extremity edema ABDOMEN:abdomen soft, non-tender and normal bowel sounds Musculoskeletal:no cyanosis of digits and no clubbing  NEURO: alert & oriented x 3 with fluent speech, no focal motor/sensory deficits   Labs:  Lab Results  Component Value Date   WBC 0.4* 06/27/2015   HGB 7.5* 06/27/2015   HCT 22.7* 06/27/2015   MCV 82.8 06/27/2015   PLT 11* 06/27/2015   NEUTROABS 0.7* 06/16/2015    Lab Results  Component Value Date   NA 138 06/27/2015   K 3.6 06/27/2015   CL 102 06/27/2015   CO2 28 06/27/2015       Assessment & Plan:  Refractory Myeloma, lambda light chain Recurrent Hypercalcemia DNR status Pancytopenia Coag negative staph in one blood culture only Pain  Transfuse one dose platelets.   Coagulase negative staph in one blood culture only. Questionable significance. Would continue ABX for now. T max 100.1. May consider repeating blood cultures  tomorrow.  Fentanyl increased to 167mcg. Patient still notes significant pain. Will re-address tomorrow. Currently has morphine IV for breakthrough.  Calcium fairly stable.  Have encouraged OOB to chair as able. Will f/u in am.  Molli Hazard, MD 06/27/2015  11:34 AM

## 2015-06-28 LAB — COMPREHENSIVE METABOLIC PANEL
ALBUMIN: 2.3 g/dL — AB (ref 3.5–5.0)
ALT: 26 U/L (ref 17–63)
ANION GAP: 8 (ref 5–15)
AST: 33 U/L (ref 15–41)
Alkaline Phosphatase: 55 U/L (ref 38–126)
BILIRUBIN TOTAL: 1.1 mg/dL (ref 0.3–1.2)
BUN: 10 mg/dL (ref 6–20)
CHLORIDE: 102 mmol/L (ref 101–111)
CO2: 27 mmol/L (ref 22–32)
Calcium: 11.2 mg/dL — ABNORMAL HIGH (ref 8.9–10.3)
Creatinine, Ser: 0.83 mg/dL (ref 0.61–1.24)
GFR calc Af Amer: >60 mL/min (ref >60)
GFR calc non Af Amer: >60 mL/min (ref >60)
GLUCOSE: 105 mg/dL — AB (ref 65–99)
POTASSIUM: 3.3 mmol/L — AB (ref 3.5–5.1)
SODIUM: 137 mmol/L (ref 135–145)
TOTAL PROTEIN: 5.1 g/dL — AB (ref 6.5–8.1)

## 2015-06-28 LAB — CBC
HEMATOCRIT: 21.5 % — AB (ref 39.0–52.0)
HEMOGLOBIN: 7 g/dL — AB (ref 13.0–17.0)
MCH: 27.3 pg (ref 26.0–34.0)
MCHC: 32.6 g/dL (ref 30.0–36.0)
MCV: 84 fL (ref 78.0–100.0)
Platelets: 25 10*3/uL — CL (ref 150–400)
RBC: 2.56 MIL/uL — ABNORMAL LOW (ref 4.22–5.81)
RDW: 20.6 % — ABNORMAL HIGH (ref 11.5–15.5)
WBC: 0.5 10*3/uL — CL (ref 4.0–10.5)

## 2015-06-28 LAB — PREPARE PLATELET PHERESIS: Unit division: 0

## 2015-06-28 LAB — PREPARE RBC (CROSSMATCH)

## 2015-06-28 LAB — VANCOMYCIN, TROUGH: Vancomycin Tr: 18 ug/mL (ref 10.0–20.0)

## 2015-06-28 MED ORDER — SODIUM CHLORIDE 0.9 % IV SOLN
Freq: Once | INTRAVENOUS | Status: AC
  Administered 2015-06-28: 21:00:00 via INTRAVENOUS

## 2015-06-28 MED ORDER — POTASSIUM CHLORIDE CRYS ER 20 MEQ PO TBCR
20.0000 meq | EXTENDED_RELEASE_TABLET | Freq: Two times a day (BID) | ORAL | Status: AC
  Administered 2015-06-28 (×2): 20 meq via ORAL
  Filled 2015-06-28 (×2): qty 1

## 2015-06-28 MED ORDER — DIPHENHYDRAMINE HCL 25 MG PO CAPS
25.0000 mg | ORAL_CAPSULE | Freq: Once | ORAL | Status: AC
  Administered 2015-06-28: 25 mg via ORAL
  Filled 2015-06-28: qty 1

## 2015-06-28 MED ORDER — ACETAMINOPHEN 325 MG PO TABS
650.0000 mg | ORAL_TABLET | Freq: Once | ORAL | Status: AC
  Administered 2015-06-28: 650 mg via ORAL
  Filled 2015-06-28: qty 2

## 2015-06-28 NOTE — Plan of Care (Signed)
Problem: Safety: Goal: Ability to remain free from injury will improve Outcome: Progressing Falls prevention protocol maintained. Reviewed with patient and reinforced the importance of adherence to safety measures to reduce likelihood for falls or fall-related injuries, such as appropriate use of the call bell,  maintaining the bed in low and locked position, keeping needed items within easy access and consistent wearing of non-skid footwear during ambulation attempts. Patient verbalized understanding and demonstrated compliance.         

## 2015-06-28 NOTE — Progress Notes (Signed)
Or Rabelo   DOB:1970/10/01   [id number]    Subjective: Doing better. Up and sitting in room. Wants to go out in hallway and walk. Pain is stable to somewhat improved. Appetite poor.  Objective:  Filed Vitals:   06/27/15 2114 06/28/15 0640  BP: 96/59 90/48  Pulse: 103 106  Temp: 98.3 F (36.8 C) 98.9 F (37.2 C)  Resp: 16 18     Intake/Output Summary (Last 24 hours) at 06/28/15 1047 Last data filed at 06/28/15 0010  Gross per 24 hour  Intake   1000 ml  Output      0 ml  Net   1000 ml    GENERAL:alert, no distress and comfortable SKIN: skin color, texture, turgor are normal, no rashes or significant lesions EYES: normal, Conjunctiva are pink and non-injected, sclera clear OROPHARYNX:no exudate, no erythema and lips, buccal mucosa, and tongue normal  NECK: supple, thyroid normal size, non-tender, without nodularity LYMPH:  no palpable lymphadenopathy in the cervical, axillary or inguinal LUNGS: clear to auscultation and percussion with normal breathing effort HEART: regular rate & rhythm and no murmurs and no lower extremity edema ABDOMEN:abdomen soft, non-tender and normal bowel sounds Musculoskeletal:no cyanosis of digits and no clubbing  NEURO: alert & oriented x 3 with fluent speech, no focal motor/sensory deficits   Labs:  Lab Results  Component Value Date   WBC 0.5* 06/28/2015   HGB 7.0* 06/28/2015   HCT 21.5* 06/28/2015   MCV 84.0 06/28/2015   PLT 25* 06/28/2015   NEUTROABS 0.7* 06/16/2015    Lab Results  Component Value Date   NA 137 06/28/2015   K 3.3* 06/28/2015   CL 102 06/28/2015   CO2 27 06/28/2015           Assessment & Plan:  Refractory Myeloma, lambda light chain Recurrent Hypercalcemia DNR status Pancytopenia Coag negative staph in one blood culture only Pain Hypokalemia  Transfuse PRBC.  Coagulase negative staph in one blood culture only. Questionable significance. Would continue ABX for now. Will repeat blood cultures X  2.  Fentanyl increased to 163mcg. Continue with current dose. Will monitor breathrough meds over next 24 hours and adjust fentanyl accordingly.  Calcium fairly stable.  Have encouraged ongoing physical activity as tolerated.   Molli Hazard, MD 06/28/2015  10:47 AM

## 2015-06-28 NOTE — Progress Notes (Signed)
Latest Hemoglobin and Hematocrit measurements revealed minimal diminution of values from the previous results yesterday,  requiring transfusion of one unit of packed red blood cells, per physician's directive. There was evidence of comparative reduction of Hemoglobin and Hematocrit values, from 7.5 g./dL and 22.7%, respectively to 7.0 g/dL and 21.5%. Blood transfusion was started at 21:00 and completed at 23:04, without ensuing complications. Vital signs remained stable throughout the transfusion process. Patient showed no signs or symptoms of transfusion reaction, such as SOB, fever, chills, back or flank pain, hematuria, urticarial lesions or cutaneous skin changes.  Post transfusion CBC drawn at 05:40 this AM revealed relative increase of Hemoglobin and Hematocrit levels (to 8.1 g/dL and 24.2%, respectively) over pre-transfusion values.

## 2015-06-28 NOTE — Progress Notes (Signed)
Pharmacy Antibiotic Time-Out Note  Mark [name] is a 44 y.o. year-old male admitted on 06/24/2015.  The patient is currently on Vancomycin and Fortaz for febrile neutropenia.  Assessment/Plan: Received Neulasta on 11/17. Consider checking CBC w/ diff and Neupogen if ANC <0.1. Vanc trough is in desired range. Continue 1g IV q18h.  Temp (24hrs), Avg:98.4 F (36.9 C), Min:97.8 F (36.6 C), Max:98.9 F (37.2 C)   Recent Labs Lab 06/24/15 1723 06/25/15 0552 06/26/15 0520 06/27/15 0500 06/28/15 0710  WBC <0.1* 0.3* 0.3* 0.4* 0.5*    Recent Labs Lab 06/24/15 1518 06/25/15 0552 06/26/15 0520 06/27/15 0500 06/28/15 0710  CREATININE 1.1 0.93 0.85 0.81 0.83   Estimated Creatinine Clearance: 122.2 mL/min (by C-G formula based on Cr of 0.83).   Antimicrobial allergies: none  Antimicrobials this admission: 11/23 >> famvir/flucon PTA resumed 11/23 >> Tressie Ellis  >> 11/23 >> Vanc >> 11/24 >> Flagyl >> 11/25  Levels/dose changes this admission: 11/26: VT not drawn and doses delayed d/t platelet infusion 11/27: VT = 18 on 1g q8h, cont same  Microbiology Results: 11/23 blood x2: PAC = CNS, peripheral = NGTD 11/24 urine: NGF  Thank you for allowing pharmacy to be a part of this patient's care.  Romeo Rabon, PharmD, pager [phone]. 06/28/2015,4:38 PM.

## 2015-06-28 NOTE — Progress Notes (Addendum)
Patient continues to receive analgesic therapy with PRN dosing of IV Morphine for chronic, multiple-site pain.  Precise localization of pain points to the entire neck circumference, anterior and posterior aspects of both shoulders and the spine as the focal points of tenderness. Recurrent pain episodes this past shift were treated with PRN intravenous dose of Morphine, with good outcome.

## 2015-06-29 ENCOUNTER — Other Ambulatory Visit: Payer: BLUE CROSS/BLUE SHIELD

## 2015-06-29 ENCOUNTER — Inpatient Hospital Stay (HOSPITAL_COMMUNITY): Payer: BLUE CROSS/BLUE SHIELD

## 2015-06-29 DIAGNOSIS — R509 Fever, unspecified: Secondary | ICD-10-CM

## 2015-06-29 LAB — TYPE AND SCREEN
ABO/RH(D): B POS
ANTIBODY SCREEN: POSITIVE
DAT, IgG: NEGATIVE
UNIT DIVISION: 0
Unit division: 0
Unit division: 0
Unit division: 0

## 2015-06-29 LAB — CULTURE, BLOOD (ROUTINE X 2): Culture: NO GROWTH

## 2015-06-29 LAB — COMPREHENSIVE METABOLIC PANEL
ALBUMIN: 2.3 g/dL — AB (ref 3.5–5.0)
ALK PHOS: 56 U/L (ref 38–126)
ALT: 29 U/L (ref 17–63)
AST: 38 U/L (ref 15–41)
Anion gap: 8 (ref 5–15)
BUN: 8 mg/dL (ref 6–20)
CALCIUM: 11.2 mg/dL — AB (ref 8.9–10.3)
CHLORIDE: 102 mmol/L (ref 101–111)
CO2: 26 mmol/L (ref 22–32)
CREATININE: 1.02 mg/dL (ref 0.61–1.24)
GFR calc Af Amer: >60 mL/min (ref >60)
GFR calc non Af Amer: >60 mL/min (ref >60)
GLUCOSE: 121 mg/dL — AB (ref 65–99)
Potassium: 3.4 mmol/L — ABNORMAL LOW (ref 3.5–5.1)
SODIUM: 136 mmol/L (ref 135–145)
Total Bilirubin: 0.6 mg/dL (ref 0.3–1.2)
Total Protein: 5.2 g/dL — ABNORMAL LOW (ref 6.5–8.1)

## 2015-06-29 LAB — CBC
HCT: 24.2 % — ABNORMAL LOW (ref 39.0–52.0)
HEMOGLOBIN: 8.1 g/dL — AB (ref 13.0–17.0)
MCH: 27.9 pg (ref 26.0–34.0)
MCHC: 33.5 g/dL (ref 30.0–36.0)
MCV: 83.4 fL (ref 78.0–100.0)
PLATELETS: 13 10*3/uL — AB (ref 150–400)
RBC: 2.9 MIL/uL — ABNORMAL LOW (ref 4.22–5.81)
RDW: 19.1 % — ABNORMAL HIGH (ref 11.5–15.5)
WBC: 0.5 10*3/uL — CL (ref 4.0–10.5)

## 2015-06-29 MED ORDER — CALCITONIN (SALMON) 200 UNIT/ML IJ SOLN
600.0000 [IU] | Freq: Two times a day (BID) | INTRAMUSCULAR | Status: AC
  Administered 2015-06-29 – 2015-06-30 (×4): 600 [IU] via SUBCUTANEOUS
  Filled 2015-06-29 (×5): qty 3

## 2015-06-29 NOTE — Progress Notes (Signed)
Mark [name] is slowly getting better. He has staph bacteremia. He does have the Port-A-Cath in place. I have to treat this staph bacteremia as significant. I do want to get an echocardiogram on his heart to make sure there is nothing going on with respect to endocarditis.  His calcium is holding steady at 11.2. I'll give him some more calcitonin to try to bring it down a little bit more.  I will see if radiation oncology  Can treat him for the neck issue. I think he has a fracture at  C5.  He is out of bed a little bit.  He's had a better appetite. There is no nausea or vomiting. He's had no diarrhea. He's had  No bleeding.  He apparently was transfused yesterday.he received 1 unit of blood. His hemoglobin was 7.  His platelet count is 13. He is not bleeding so we can hold on a platelet transfusion.  His white cell count 0.5. This is slowly coming up.  [name], is temperature 99.1. Pulse 107. Blood pressure 105/71. Head and neck exam shows no ocular or oral lesions. There are no palpable cervical or supraclavicular lymph nodes. Lungs are clear. Has good breath sounds bilaterally. Cardiac exam regular rate and rhythm with no murmurs, rubs or bruits. Abdomen is soft. Has decent bowel sounds. There is no fluid wave. There is no palpable liver or spleen tip. Extremities shows no clubbing, cyanosis or edema. Has good strength in his extremities. Neurological exam is nonfocal.  For now, he will continue the antibiotics. I want to make sure that his white cell count is above 1004 stop his antibiotics.  I'll see if radiation oncology can treat him.  We will try calcitonin to help get his calcium down a little bit more.  We will see what the echocardiogram shows.  As always, I am very appreciative of the outstanding care that he is getting up on [address].!!  Lum Keas  Colossians 2:6

## 2015-06-29 NOTE — Progress Notes (Signed)
CSW has been continuing to follow as CSW received referral to please follow for discharge needs. Pt will probably need hospice care upon discharge.  CSW has continued to review chart and there has been no further mention of plan for hospice care.  Inappropriate CSW referral.   CSW signing off.   Please re-consult if social work needs arise.   Alison Murray, MSW, [place] [facility] [phone]

## 2015-06-29 NOTE — Progress Notes (Signed)
  Echocardiogram 2D Echocardiogram has been performed.  Mark [name] 06/29/2015, 3:08 PM

## 2015-06-29 NOTE — Progress Notes (Signed)
Potassium level of 3.4, called in to Dr. Antonieta Pert office,no new order.

## 2015-06-30 ENCOUNTER — Other Ambulatory Visit: Payer: BLUE CROSS/BLUE SHIELD

## 2015-06-30 ENCOUNTER — Ambulatory Visit: Payer: BLUE CROSS/BLUE SHIELD

## 2015-06-30 ENCOUNTER — Ambulatory Visit
Admit: 2015-06-30 | Discharge: 2015-06-30 | Disposition: A | Discharge status: Home / Self Care | Payer: BLUE CROSS/BLUE SHIELD | Attending: Radiation Oncology | Admitting: Radiation Oncology

## 2015-06-30 DIAGNOSIS — R197 Diarrhea, unspecified: Secondary | ICD-10-CM

## 2015-06-30 DIAGNOSIS — G893 Neoplasm related pain (acute) (chronic): Secondary | ICD-10-CM

## 2015-06-30 DIAGNOSIS — Z510 Encounter for antineoplastic radiation therapy: Secondary | ICD-10-CM | POA: Diagnosis not present

## 2015-06-30 DIAGNOSIS — C9002 Multiple myeloma in relapse: Secondary | ICD-10-CM

## 2015-06-30 LAB — COMPREHENSIVE METABOLIC PANEL
ALBUMIN: 2.4 g/dL — AB (ref 3.5–5.0)
ALK PHOS: 52 U/L (ref 38–126)
ALT: 25 U/L (ref 17–63)
AST: 31 U/L (ref 15–41)
Anion gap: 5 (ref 5–15)
BILIRUBIN TOTAL: 0.4 mg/dL (ref 0.3–1.2)
BUN: 7 mg/dL (ref 6–20)
CO2: 30 mmol/L (ref 22–32)
CREATININE: 0.74 mg/dL (ref 0.61–1.24)
Calcium: 10.4 mg/dL — ABNORMAL HIGH (ref 8.9–10.3)
Chloride: 104 mmol/L (ref 101–111)
GFR calc Af Amer: >60 mL/min (ref >60)
GLUCOSE: 109 mg/dL — AB (ref 65–99)
Potassium: 3.7 mmol/L (ref 3.5–5.1)
Sodium: 139 mmol/L (ref 135–145)
TOTAL PROTEIN: 4.9 g/dL — AB (ref 6.5–8.1)

## 2015-06-30 LAB — CBC
HCT: 23.4 % — ABNORMAL LOW (ref 39.0–52.0)
Hemoglobin: 7.7 g/dL — ABNORMAL LOW (ref 13.0–17.0)
MCH: 27.9 pg (ref 26.0–34.0)
MCHC: 32.9 g/dL (ref 30.0–36.0)
MCV: 84.8 fL (ref 78.0–100.0)
PLATELETS: 7 10*3/uL — AB (ref 150–400)
RBC: 2.76 MIL/uL — ABNORMAL LOW (ref 4.22–5.81)
RDW: 19.3 % — AB (ref 11.5–15.5)
WBC: 0.6 10*3/uL — AB (ref 4.0–10.5)

## 2015-06-30 MED ORDER — ACETAMINOPHEN 325 MG PO TABS
650.0000 mg | ORAL_TABLET | Freq: Once | ORAL | Status: AC
  Administered 2015-06-30: 650 mg via ORAL
  Filled 2015-06-30: qty 2

## 2015-06-30 MED ORDER — FENTANYL 100 MCG/HR TD PT72
200.0000 ug | MEDICATED_PATCH | TRANSDERMAL | Status: DC
  Administered 2015-06-30 – 2015-07-04 (×3): 200 ug via TRANSDERMAL
  Filled 2015-06-30 (×3): qty 2

## 2015-06-30 MED ORDER — DIPHENHYDRAMINE HCL 25 MG PO CAPS
25.0000 mg | ORAL_CAPSULE | Freq: Once | ORAL | Status: AC
  Administered 2015-06-30: 25 mg via ORAL

## 2015-06-30 MED ORDER — SODIUM CHLORIDE 0.9 % IV SOLN
Freq: Once | INTRAVENOUS | Status: AC
  Administered 2015-06-30: 12:00:00 via INTRAVENOUS

## 2015-06-30 MED ORDER — BISACODYL 5 MG PO TBEC
10.0000 mg | DELAYED_RELEASE_TABLET | Freq: Two times a day (BID) | ORAL | Status: DC
  Administered 2015-06-30 – 2015-07-04 (×8): 10 mg via ORAL
  Filled 2015-06-30 (×9): qty 2

## 2015-06-30 MED ORDER — GABAPENTIN 300 MG PO CAPS
600.0000 mg | ORAL_CAPSULE | Freq: Three times a day (TID) | ORAL | Status: DC
  Administered 2015-06-30 – 2015-07-04 (×12): 600 mg via ORAL
  Filled 2015-06-30 (×12): qty 2

## 2015-06-30 NOTE — Progress Notes (Signed)
Mr. Amat is still having issues with pain. I will increase his fentanyl patch up to 200 g.  He will go down to start radiation to his neck today or tomorrow. He will go down today at least for simulation.  He had a good echocardiogram. A good ejection fraction.  He's been afebrile. He continues on IV antibiotics. His white cell count is 0.6. We still have to continue him on the antibiotics until his white cell count gets above 1000.  His calcium is 10.4. The calcitonin seems to be helping.  He is a little constipated. We will give him some Dulcolax.  His platelet count is 7000. We will give him a platelet transfusion today. He is not bleeding.  He is out of bed a little bit.  [name], his vital signs appear stable. His temperature 98.8. Pulse 102. Blood pressure 108/62. His head and neck exam shows no thrush. He has no scleral icterus. He has no adenopathy in the neck. Lungs are clear. Cardiac exam regular rate and rhythm with no murmurs, rubs or bruits. Abdomen is soft. He has good bowel sounds. There is no fluid wave. There is no palpable liver or spleen tip. Extremities shows no clubbing, cyanosis or edema. Skin exam shows no rashes. Neurological exam is nonfocal.  Because of the neutropenia he really is not able to go home yet. Once we get his white cell count above 1000, then we might be able to get him home.  As always, I very much appreciate the one from care that he is getting on the floor. All the nurses and staff are doing a great job with him.  Psalm 55:22

## 2015-06-30 NOTE — Progress Notes (Signed)
  Radiation Oncology         [phone] ________________________________  Name: Mark [name] MRN: [id number]  Date: 06/30/2015  DOB: [birth year]  SIMULATION AND TREATMENT PLANNING NOTE - inpatient    ICD-9-CM ICD-10-CM   1. Multiple myeloma in relapse ([place]) 203.02 C90.02     DIAGNOSIS:  Refractory multiple myeloma  NARRATIVE:  The patient was brought to the [facility].  Identity was confirmed.  All relevant records and images related to the planned course of therapy were reviewed.  The patient freely provided informed written consent to proceed with treatment after reviewing the details related to the planned course of therapy. The consent form was witnessed and verified by the simulation staff.  Then, the patient was set-up in a stable reproducible  supine position for radiation therapy.  CT images were obtained.  Surface markings were placed.  The CT images were loaded into the planning software.  Then the target and avoidance structures were contoured.  Treatment planning then occurred.  The radiation prescription was entered and confirmed.  Then, I designed and supervised the construction of a total of 3 medically necessary complex treatment devices.  I have requested : 3D Simulation  I have requested a DVH of the following structures: GTV, PTV, larynx, spinal cord.  I have ordered:dose calc.  PLAN:  The patient will receive 25 Gy in 10 fractions directed at the C5 area where his pathologic fracture is located.  ________________________________  -----------------------------------  Blair Promise, PhD, MD

## 2015-07-01 ENCOUNTER — Ambulatory Visit
Admit: 2015-07-01 | Discharge: 2015-07-01 | Disposition: A | Discharge status: Home / Self Care | Payer: BLUE CROSS/BLUE SHIELD | Attending: Radiation Oncology | Admitting: Radiation Oncology

## 2015-07-01 DIAGNOSIS — C9002 Multiple myeloma in relapse: Secondary | ICD-10-CM

## 2015-07-01 DIAGNOSIS — Z510 Encounter for antineoplastic radiation therapy: Secondary | ICD-10-CM | POA: Diagnosis not present

## 2015-07-01 LAB — COMPREHENSIVE METABOLIC PANEL
ALBUMIN: 2.3 g/dL — AB (ref 3.5–5.0)
ALK PHOS: 45 U/L (ref 38–126)
ALT: 26 U/L (ref 17–63)
AST: 36 U/L (ref 15–41)
Anion gap: 5 (ref 5–15)
BILIRUBIN TOTAL: 0.7 mg/dL (ref 0.3–1.2)
BUN: 8 mg/dL (ref 6–20)
CALCIUM: 10.5 mg/dL — AB (ref 8.9–10.3)
CO2: 30 mmol/L (ref 22–32)
Chloride: 106 mmol/L (ref 101–111)
Creatinine, Ser: 0.75 mg/dL (ref 0.61–1.24)
GFR calc Af Amer: >60 mL/min (ref >60)
GFR calc non Af Amer: >60 mL/min (ref >60)
GLUCOSE: 109 mg/dL — AB (ref 65–99)
Potassium: 3.7 mmol/L (ref 3.5–5.1)
Sodium: 141 mmol/L (ref 135–145)
TOTAL PROTEIN: 5.2 g/dL — AB (ref 6.5–8.1)

## 2015-07-01 LAB — PREPARE PLATELET PHERESIS: Unit division: 0

## 2015-07-01 LAB — CBC
HEMATOCRIT: 23.5 % — AB (ref 39.0–52.0)
Hemoglobin: 7.6 g/dL — ABNORMAL LOW (ref 13.0–17.0)
MCH: 27.4 pg (ref 26.0–34.0)
MCHC: 32.3 g/dL (ref 30.0–36.0)
MCV: 84.8 fL (ref 78.0–100.0)
Platelets: 17 10*3/uL — CL (ref 150–400)
RBC: 2.77 MIL/uL — ABNORMAL LOW (ref 4.22–5.81)
RDW: 19 % — AB (ref 11.5–15.5)
WBC: 0.6 10*3/uL — CL (ref 4.0–10.5)

## 2015-07-01 LAB — VANCOMYCIN, TROUGH: VANCOMYCIN TR: 17 ug/mL (ref 10.0–20.0)

## 2015-07-01 MED ORDER — FILGRASTIM 480 MCG/1.6ML IJ SOLN
480.0000 ug | Freq: Every day | INTRAMUSCULAR | Status: DC
  Administered 2015-07-01 – 2015-07-04 (×4): 480 ug via SUBCUTANEOUS
  Filled 2015-07-01 (×5): qty 1.6

## 2015-07-01 MED ORDER — SODIUM CHLORIDE 0.9 % IV SOLN
1000.0000 mL | INTRAVENOUS | Status: DC
  Administered 2015-07-03 (×2): 1000 mL via INTRAVENOUS

## 2015-07-01 NOTE — Progress Notes (Signed)
  Radiation Oncology         ([phone]) [phone] ________________________________  Name: Jeison Delpilar MRN: [id number]  Date: 07/01/2015  DOB: [birth year]  Simulation Verification Note    ICD-9-CM ICD-10-CM   1. Multiple myeloma in relapse ([place]) 203.02 C90.02     Status: inpatient  NARRATIVE: The patient was brought to the treatment unit and placed in the planned treatment position. The clinical setup was verified. Then port films were obtained and uploaded to the radiation oncology medical record software.  The treatment beams were carefully compared against the planned radiation fields. The position location and shape of the radiation fields was reviewed. They targeted volume of tissue appears to be appropriately covered by the radiation beams. Organs at risk appear to be excluded as planned.  Based on my personal review, I approved the simulation verification. The patient's treatment will proceed as planned.  -----------------------------------  Blair Promise, PhD, MD

## 2015-07-01 NOTE — Progress Notes (Signed)
Pharmacy - Brief Note (vancomycin trough)  Keywan Reau is a 44 y.o. year-old male admitted on 06/24/2015. The patient is currently on Vancomycin febrile neutropenia. Found CoNS growing on blood x1 from port (may represent colonization as peripheral blood cultures were no growth)   Dose changes/levels:  11/26: VT not drawn and doses delayed d/t platelet infusion  11/27: VT = 18 on 1g q8h, cont same 11/30: VT = 17 on 1g q8h  Plan:   Vancomycin trough remains within goal, no changes needed, continue 1gm IV q8h  Doreene Eland, PharmD, BCPS.   Pager: [phone] 07/01/2015 5:15 PM

## 2015-07-01 NOTE — Progress Notes (Signed)
Mark [name] went down for radiation simulation yesterday. He will start today.  He's had no fever. He continues on antibiotics. He is still neutropenic with his white cell count of 0.6.  His hemoglobin is 7.6. His platelet count is 17,000. He did get platelets yesterday.  His calcium is 10.5 which is holding fairly stable.  We increased his fentanyl patches. This is helped his pain a little bit.  He is having some bowel movements. We gave him some Dulcolax yesterday.  He's had no nausea or vomiting. His appetite is improving.  He's had no bleeding. He's had no rashes.  [name], his vital signs are stable. Blood pressure is 95/64. Pulse is 108. Temperature 98.5. Head and neck exam shows no ocular or oral lesions. There are no palpable cervical or supraclavicular lymph nodes. Lungs are with some slight decrease at the bases. Cardiac exam regular rate and rhythm with no murmurs, rubs or bruits. Abdomen is soft. Has good bowel sounds. There is no fluid wave. There is no palpable liver or spleen tip. Extremities shows no clubbing, cyanosis or edema. Neurological exam is nonfocal.  I will start him on some Neupogen. I need to try to get his white cell count up above 1000 before I can let him go home.  He'll start radiation for his neck. Hopefully, this will help with his pain.  I will cut back his IV fluids a little bit. We'll see what his calcium does.  Hopefully, we will be able to get him home by Friday.  As always, the staff on [address] is doing an outstanding job with him.  Mark [name]  Psalm 37:11

## 2015-07-01 NOTE — Progress Notes (Addendum)
Pharmacy Antibiotic Time-Out Note  Mark [name] is a 44 y.o. year-old male admitted on 06/24/2015.  The patient is currently on Vancomycin febrile neutropenia.  Found CoNS growing on blood x1 from port.  Assessment/Plan:  Received Neulasta on 11/17; started on Neupogen today.  Continue on vancomycin 1g IV q8h; plan to continue until WBC > 1000  Will recheck VT tonight  Ceftazidime stopped by MD  Temp (24hrs), Avg:98.2 F (36.8 C), Min:97.5 F (36.4 C), Max:98.6 F (37 C)   Recent Labs Lab 06/27/15 0500 06/28/15 0710 06/29/15 0540 06/30/15 0507 07/01/15 0430  WBC 0.4* 0.5* 0.5* 0.6* 0.6*     Recent Labs Lab 06/27/15 0500 06/28/15 0710 06/29/15 0540 06/30/15 0507 07/01/15 0430  CREATININE 0.81 0.83 1.02 0.74 0.75   Estimated Creatinine Clearance: 126.8 mL/min (by C-G formula based on Cr of 0.75).    Antimicrobial allergies: none   Antimicrobials this admission: 11/23 >> famvir/flucon PTA resumed 11/23 >> Tressie Ellis  >> 11/30 11/23 >> Vanc >>  11/24 >> Flagyl >> 11/25  Levels/dose changes this admission: 11/26: VT not drawn and doses delayed d/t platelet infusion 11/27: VT = 18 on 1g q8h, cont same  Microbiology Results: 11/23 blood x2: PAC = CoNS, peripheral = NGTD 11/24 urine: NGF 11/27 blood x2: ngtd  Thank you for allowing pharmacy to be a part of this patient's care.  Reuel Boom, PharmD, BCPS Pager: [phone] 07/01/2015, 9:50 AM

## 2015-07-02 ENCOUNTER — Ambulatory Visit
Admit: 2015-07-02 | Discharge: 2015-07-02 | Disposition: A | Discharge status: Home / Self Care | Payer: BLUE CROSS/BLUE SHIELD | Attending: Radiation Oncology | Admitting: Radiation Oncology

## 2015-07-02 DIAGNOSIS — Z510 Encounter for antineoplastic radiation therapy: Secondary | ICD-10-CM | POA: Diagnosis not present

## 2015-07-02 LAB — COMPREHENSIVE METABOLIC PANEL
ALBUMIN: 2.8 g/dL — AB (ref 3.5–5.0)
ALK PHOS: 55 U/L (ref 38–126)
ALT: 25 U/L (ref 17–63)
AST: 37 U/L (ref 15–41)
Anion gap: 6 (ref 5–15)
BILIRUBIN TOTAL: 0.7 mg/dL (ref 0.3–1.2)
BUN: 9 mg/dL (ref 6–20)
CALCIUM: 12 mg/dL — AB (ref 8.9–10.3)
CO2: 28 mmol/L (ref 22–32)
Chloride: 105 mmol/L (ref 101–111)
Creatinine, Ser: 1 mg/dL (ref 0.61–1.24)
GFR calc Af Amer: >60 mL/min (ref >60)
GFR calc non Af Amer: >60 mL/min (ref >60)
GLUCOSE: 136 mg/dL — AB (ref 65–99)
POTASSIUM: 3.9 mmol/L (ref 3.5–5.1)
SODIUM: 139 mmol/L (ref 135–145)
TOTAL PROTEIN: 5.7 g/dL — AB (ref 6.5–8.1)

## 2015-07-02 LAB — CBC
HEMATOCRIT: 24.5 % — AB (ref 39.0–52.0)
HEMOGLOBIN: 8 g/dL — AB (ref 13.0–17.0)
MCH: 28.3 pg (ref 26.0–34.0)
MCHC: 32.7 g/dL (ref 30.0–36.0)
MCV: 86.6 fL (ref 78.0–100.0)
Platelets: 10 10*3/uL — CL (ref 150–400)
RBC: 2.83 MIL/uL — ABNORMAL LOW (ref 4.22–5.81)
RDW: 19.5 % — ABNORMAL HIGH (ref 11.5–15.5)
WBC: 1.2 10*3/uL — AB (ref 4.0–10.5)

## 2015-07-02 LAB — PREPARE RBC (CROSSMATCH)

## 2015-07-02 MED ORDER — SODIUM CHLORIDE 0.9 % IV SOLN
Freq: Once | INTRAVENOUS | Status: AC
  Administered 2015-07-02: 17:00:00 via INTRAVENOUS

## 2015-07-02 MED ORDER — FUROSEMIDE 10 MG/ML IJ SOLN
40.0000 mg | Freq: Once | INTRAMUSCULAR | Status: AC
  Administered 2015-07-02: 40 mg via INTRAVENOUS
  Filled 2015-07-02 (×2): qty 4

## 2015-07-02 MED ORDER — CALCITONIN (SALMON) 200 UNIT/ML IJ SOLN
8.0000 [IU]/kg | Freq: Three times a day (TID) | INTRAMUSCULAR | Status: AC
  Administered 2015-07-02 (×3): 704 [IU] via INTRAMUSCULAR
  Filled 2015-07-02 (×3): qty 3.52

## 2015-07-02 MED ORDER — SODIUM CHLORIDE 0.9 % IV SOLN
90.0000 mg | Freq: Once | INTRAVENOUS | Status: AC
  Administered 2015-07-02: 90 mg via INTRAVENOUS
  Filled 2015-07-02: qty 10

## 2015-07-02 NOTE — Progress Notes (Signed)
Unfortunately, his calcium is going back up. Is now up to 12. This clearly is a poor sign for him. We will try more calcitonin. I will also give him a dose of Aredia.  With his increasing calcium, his temperature also goes up a little bit.  His white cell count is now above 1. I think we are probably stop the antibiotics.  His plate count is 075-GRM. His hemoglobin is 8. I really think that platelets and blood will be necessary for him.  He ate pretty well yesterday. He had no nausea or vomiting.  He started his radiation therapy. He is having some pain. He is on fentanyl patch and IV morphine. These are helping a little bit.Mark [name]  He is not having diarrhea. He's not having constipation. He is not having any blood in the urine.  He's had no hemoptysis. There is no shortness of breath.  [name], his temperature 99.6. Pulse 127. Respiratory rate 14. Blood pressure 114/66. His oral exam is negative for mucositis. He has no thrush. Lungs are clear. Cardiac exam tachycardic but regular. Abdomen soft. He has good bowel sounds. There is no fluid wave. There is no palpable liver or spleen tip. Extremities shows no clubbing, cyanosis or edema. Neurological exam is nonfocal.  I want to try to get his calcium down a little bit if I can. I want take his blood counts a little better. Hopefully, we might be oh to get him home tomorrow. I'll then have to try to treat him as an outpatient.  Again, the calcium will really be the indicator as to how he will do.  The staff on [address] as always, are doing great job in taking care of him.!!  Lum Keas  Psalm 37:5-6

## 2015-07-03 ENCOUNTER — Inpatient Hospital Stay: Payer: BLUE CROSS/BLUE SHIELD

## 2015-07-03 ENCOUNTER — Ambulatory Visit
Admit: 2015-07-03 | Discharge: 2015-07-03 | Disposition: A | Discharge status: Home / Self Care | Payer: BLUE CROSS/BLUE SHIELD | Attending: Radiation Oncology | Admitting: Radiation Oncology

## 2015-07-03 DIAGNOSIS — Z510 Encounter for antineoplastic radiation therapy: Secondary | ICD-10-CM | POA: Diagnosis not present

## 2015-07-03 DIAGNOSIS — D696 Thrombocytopenia, unspecified: Secondary | ICD-10-CM

## 2015-07-03 LAB — PREPARE PLATELET PHERESIS: UNIT DIVISION: 0

## 2015-07-03 LAB — TYPE AND SCREEN
ABO/RH(D): B POS
Antibody Screen: POSITIVE
DAT, IGG: NEGATIVE
UNIT DIVISION: 0
Unit division: 0

## 2015-07-03 LAB — CBC
HEMATOCRIT: 29 % — AB (ref 39.0–52.0)
Hemoglobin: 9.6 g/dL — ABNORMAL LOW (ref 13.0–17.0)
MCH: 28.1 pg (ref 26.0–34.0)
MCHC: 33.1 g/dL (ref 30.0–36.0)
MCV: 84.8 fL (ref 78.0–100.0)
Platelets: 13 10*3/uL — CL (ref 150–400)
RBC: 3.42 MIL/uL — ABNORMAL LOW (ref 4.22–5.81)
RDW: 17.8 % — AB (ref 11.5–15.5)
WBC: 1.4 10*3/uL — CL (ref 4.0–10.5)

## 2015-07-03 LAB — COMPREHENSIVE METABOLIC PANEL
ALT: 24 U/L (ref 17–63)
ANION GAP: 7 (ref 5–15)
AST: 32 U/L (ref 15–41)
Albumin: 2.8 g/dL — ABNORMAL LOW (ref 3.5–5.0)
Alkaline Phosphatase: 55 U/L (ref 38–126)
BILIRUBIN TOTAL: 1.4 mg/dL — AB (ref 0.3–1.2)
BUN: 10 mg/dL (ref 6–20)
CALCIUM: 11.5 mg/dL — AB (ref 8.9–10.3)
CO2: 31 mmol/L (ref 22–32)
Chloride: 104 mmol/L (ref 101–111)
Creatinine, Ser: 0.79 mg/dL (ref 0.61–1.24)
GFR calc Af Amer: >60 mL/min (ref >60)
Glucose, Bld: 107 mg/dL — ABNORMAL HIGH (ref 65–99)
POTASSIUM: 3.5 mmol/L (ref 3.5–5.1)
Sodium: 142 mmol/L (ref 135–145)
TOTAL PROTEIN: 5.6 g/dL — AB (ref 6.5–8.1)

## 2015-07-03 LAB — KAPPA/LAMBDA LIGHT CHAINS
Kappa free light chain: 1022.9 mg/L — ABNORMAL HIGH (ref 3.30–19.40)
Kappa, lambda light chain ratio: 521.89 — ABNORMAL HIGH (ref 0.26–1.65)
Lambda free light chains: 1.96 mg/L — ABNORMAL LOW (ref 5.71–26.30)

## 2015-07-03 LAB — CULTURE, BLOOD (ROUTINE X 2)
Culture: NO GROWTH
Culture: NO GROWTH

## 2015-07-03 MED ORDER — FUROSEMIDE 10 MG/ML IJ SOLN
20.0000 mg | Freq: Once | INTRAMUSCULAR | Status: DC
  Filled 2015-07-03: qty 2

## 2015-07-03 MED ORDER — CALCITONIN (SALMON) 200 UNIT/ML IJ SOLN
8.0000 [IU]/kg | Freq: Three times a day (TID) | INTRAMUSCULAR | Status: AC
  Administered 2015-07-03 – 2015-07-04 (×3): 704 [IU] via INTRAMUSCULAR
  Filled 2015-07-03 (×4): qty 3.52

## 2015-07-03 MED ORDER — SODIUM CHLORIDE 0.9 % IV SOLN
Freq: Once | INTRAVENOUS | Status: AC
  Administered 2015-07-03: 18:00:00 via INTRAVENOUS

## 2015-07-03 NOTE — Progress Notes (Signed)
Mark [name] is about the same. He may feel a little bit better. He does not have as much pain. Hopefully, increasing the fentanyl patch has helped.  He got his 2 units of blood yesterday. He yet when units of platelets. His platelet count is only 13,000. His hemoglobin is 9.6.  His calcium is 11.5. I'll give him some more calcitonin today.  I also want to give him another unit of platelets.  He is doing okay with the radiation to his neck. He'll have a session today.  His appetite is doing okay. He's had no nausea or vomiting. He's had no diarrhea. There's not been constipation.  He is now off antibiotics. He's had no temperature. His temperature today was 99.1. His blood pressure is 115/58.  His oral exam shows no mucositis. Lungs are clear. Cardiac exam is tachycardic but regular. He has no murmurs, rubs or bruits. Abdomen is soft. Bowel sounds are slightly decreased. He has no guarding or rebound tenderness. Extremities shows no clubbing, cyanosis or edema.  Today, we will give him 1 unit of platelets. I'll give him some more calcitonin.  I think that he will be able to go home tomorrow. I want to make sure that we try to get his calcium down as much as possible for when he does go home. We will then be able to manage the hypercalcemia in the office.  The staff on [address] as always, has done a fantastic job.  Pete E.  Ephesians 2:8-9

## 2015-07-04 ENCOUNTER — Other Ambulatory Visit: Payer: Self-pay | Admitting: Hematology & Oncology

## 2015-07-04 DIAGNOSIS — C9002 Multiple myeloma in relapse: Secondary | ICD-10-CM

## 2015-07-04 DIAGNOSIS — B957 Other staphylococcus as the cause of diseases classified elsewhere: Secondary | ICD-10-CM

## 2015-07-04 DIAGNOSIS — K5903 Drug induced constipation: Secondary | ICD-10-CM

## 2015-07-04 DIAGNOSIS — M8458XA Pathological fracture in neoplastic disease, other specified site, initial encounter for fracture: Secondary | ICD-10-CM

## 2015-07-04 LAB — CBC
HEMATOCRIT: 26.4 % — AB (ref 39.0–52.0)
HEMOGLOBIN: 8.7 g/dL — AB (ref 13.0–17.0)
MCH: 28.2 pg (ref 26.0–34.0)
MCHC: 33 g/dL (ref 30.0–36.0)
MCV: 85.7 fL (ref 78.0–100.0)
Platelets: 24 10*3/uL — CL (ref 150–400)
RBC: 3.08 MIL/uL — ABNORMAL LOW (ref 4.22–5.81)
RDW: 17.8 % — ABNORMAL HIGH (ref 11.5–15.5)
WBC: 1.9 10*3/uL — ABNORMAL LOW (ref 4.0–10.5)

## 2015-07-04 LAB — PREPARE PLATELET PHERESIS: UNIT DIVISION: 0

## 2015-07-04 LAB — COMPREHENSIVE METABOLIC PANEL
ALBUMIN: 2.6 g/dL — AB (ref 3.5–5.0)
ALK PHOS: 52 U/L (ref 38–126)
ALT: 23 U/L (ref 17–63)
ANION GAP: 6 (ref 5–15)
AST: 31 U/L (ref 15–41)
BILIRUBIN TOTAL: 1.1 mg/dL (ref 0.3–1.2)
BUN: 10 mg/dL (ref 6–20)
CALCIUM: 10.8 mg/dL — AB (ref 8.9–10.3)
CO2: 30 mmol/L (ref 22–32)
Chloride: 105 mmol/L (ref 101–111)
Creatinine, Ser: 0.91 mg/dL (ref 0.61–1.24)
GFR calc Af Amer: >60 mL/min (ref >60)
GLUCOSE: 111 mg/dL — AB (ref 65–99)
Potassium: 3.8 mmol/L (ref 3.5–5.1)
Sodium: 141 mmol/L (ref 135–145)
TOTAL PROTEIN: 5.4 g/dL — AB (ref 6.5–8.1)

## 2015-07-04 MED ORDER — HEPARIN SOD (PORK) LOCK FLUSH 100 UNIT/ML IV SOLN
500.0000 [IU] | Freq: Once | INTRAVENOUS | Status: AC
  Administered 2015-07-04: 500 [IU] via INTRAVENOUS
  Filled 2015-07-04: qty 5

## 2015-07-04 MED ORDER — FENTANYL 100 MCG/HR TD PT72: 200.0000 ug | MEDICATED_PATCH | TRANSDERMAL | Status: AC

## 2015-07-04 MED ORDER — MORPHINE SULFATE 30 MG PO TABS: ORAL_TABLET | ORAL | Status: DC

## 2015-07-04 MED ORDER — CALCITONIN (SALMON) 200 UNIT/ML IJ SOLN: 8.0000 [IU]/kg | Freq: Once | INTRAMUSCULAR | Status: DC

## 2015-07-04 MED ORDER — MORPHINE SULFATE 30 MG PO TABS
15.0000 mg | ORAL_TABLET | ORAL | Status: DC | PRN
  Administered 2015-07-04: 30 mg via ORAL
  Filled 2015-07-04: qty 1

## 2015-07-04 NOTE — Discharge Summary (Signed)
#   [id number] is d/c summary.  Lum Keas  Psalm 44:8

## 2015-07-04 NOTE — Discharge Summary (Signed)
Mark [name], HEMP                 ACCOUNT NO.:  [id number]  MEDICAL RECORD NO.:  [id number]  LOCATION:  [id number]                         FACILITY:  [facility]  PHYSICIAN:  [name], M.D.  DATE OF BIRTH:  1970/11/15  DATE OF ADMISSION:  06/24/2015 DATE OF DISCHARGE:  07/04/2015                              DISCHARGE SUMMARY   DIAGNOSES ON DISCHARGE: 1. 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 2. Refractory kappa light chain myeloma. 3. Staph epi bacteremia. 4. Initiation of radiation therapy for C5 pathologic fracture. 5. Pancytopenia secondary to chemotherapy/myeloma. 6. Constipation secondary to pain medication. 7. Worsening pain secondary to myelomatous bone lesions.  CONDITION ON DISCHARGE:  Improved.  DIET:  Diet is without restrictions.  ACTIVITIES:  As tolerated.  FOLLOWUP: 1. The patient will continue radiation therapy at the [facility] on Monday. 2. The patient will go to the [facility] on     Monday, December 5 for an appointment.  MEDICATIONS UPON DISCHARGE: 1. Morphine sulfate 15-30 mg p.o. q.4 hours p.r.n. pain. 2. Duragesic patch 200 mcg to the skin every 2 days. 3. Dulcolax 2 p.o. b.i.d. 4. Flexeril 10 mg p.o. t.i.d. p.r.n. 5. Famvir 500 mg p.o. daily. 6. Gabapentin 600 mg p.o. t.i.d. 7. Ativan 0.5 mg p.o. q.6 hours p.r.n. nausea and vomiting. 8. Zofran ODT 8 mg p.o. q.8 hours p.r.n. nausea. 9. Compazine 10 mg p.o. q.6 hours p.r.n. 10.Protonix 40 mg p.o. daily.  HOSPITAL COURSE:  Mr. Seymour was admitted from the office on 23rd.  He was hypotensive.  He was very weak.  His calcium was 15.5.  When corrected for his low albumin, his calcium was over 17.  It was obvious that his myeloma was refractory to radiation therapy.  He had a temperature of 102.  We got blood cultures on him.  Blood cultures grew out Staph epi.  He was started initially on [place] and vancomycin as he was quite pancytopenic.   We started him on some Neupogen in the hospital.  Again, his cultures grew out Staph epi.  We had to give him transfusions for red cells and platelets.  When he was hospitalized, his platelet count was less than 5000. His calcium did respond to calcitonin and Aredia.  On the day of discharge, his calcium was 10.8.  We also started him on radiation therapy.  He had previously gotten MRI of his spine.  He has worsening neck pain.  He had a pathologic fracture at C5.  He started radiation on Wednesday, November 30.  He will have 10 days of treatment.  Once his blood infection improves, then he began to have a little bit more activity.  He is able to get up.  He had a little bit of constipation, which we treated with Dulcolax.  His appetite improved once the blood infection improved.  He had no nausea or vomiting.  He had no mouth sores.  He began to ambulate a little better.  His myeloma clearly is refractory to therapy so far.  We got light chains on him.  On July 02, 2015, serum kappa light chain was 1023.  He was transfused on the December 1st.  He had 2 units of blood previously.  He got platelets on July 02, 2015 and July 03, 2015. Upon discharge, his platelet count was 24,000.  I suspect, he probably has extensive bone marrow involvement by myeloma.  I felt, we could get him discharge to home.  His wife is very attentive and really does a great job in trying to help him.  PHYSICAL EXAMINATION:  VITAL SIGNS:  Upon discharge, his vital signs showed temperature of 99.6.  Pulse 107.  Blood pressure 131/67.  Oxygen saturation on room air was 96%. HEAD AND NECK:  No ocular or oral lesions.  He has no palpable cervical or supraclavicular lymph nodes. LUNGS:  Clear. CARDIAC:  Regular rate and rhythm with no murmurs, rubs, or bruits. ABDOMEN:  Soft.  He has good bowel sounds.  There is no fluid wave. There is no palpable liver or spleen tip. BACK:  Some slight  tenderness in the upper thoracic and lower lumbar spine. EXTREMITIES:  No clubbing, cyanosis, or edema. NEUROLOGIC:  No focal neurological deficits.  LABORATORY STUDIES:  When he was admitted, I talked to him about end of life issues.  I felt that our options were incredibly limited as to how we could help him.  I do not envision that he will ever be able to have an allogeneic bone marrow transplant, which would be his only means of having long-term survival.  I talked to him at length about rogue measures for keeping him alive. He has a very strong faith.  He did not want to be kept alive on machines.  He understood that if it was attempted to keep him alive through CPR and other heroic means, that he likely would have of bone fractures and with his low blood counts that he would bleed internally. He probably would also not be able to come off the ventilator, because he would be so weak.  Again, he does not want to be kept alive on machines.  I totally agree with this.  He was a do not resuscitate while hospitalized.  We will see him back in the office on Monday, December 5.  We will see how his calcium is doing.  Because he actually has improved so much, we might be able to try another line of therapy, although I think chance of success would be limited.     [name], M.D.     PRE/MEDQ  D:  07/04/2015  T:  07/04/2015  Job:  [id number]

## 2015-07-06 ENCOUNTER — Other Ambulatory Visit: Payer: Self-pay | Admitting: Hematology & Oncology

## 2015-07-06 ENCOUNTER — Ambulatory Visit (HOSPITAL_BASED_OUTPATIENT_CLINIC_OR_DEPARTMENT_OTHER): Payer: BLUE CROSS/BLUE SHIELD | Admitting: Family

## 2015-07-06 ENCOUNTER — Ambulatory Visit (HOSPITAL_BASED_OUTPATIENT_CLINIC_OR_DEPARTMENT_OTHER): Payer: BLUE CROSS/BLUE SHIELD

## 2015-07-06 ENCOUNTER — Ambulatory Visit
Admit: 2015-07-06 | Discharge: 2015-07-06 | Disposition: A | Discharge status: Home / Self Care | Payer: BLUE CROSS/BLUE SHIELD | Attending: Radiation Oncology | Admitting: Radiation Oncology

## 2015-07-06 ENCOUNTER — Ambulatory Visit
Admission: RE | Admit: 2015-07-06 | Discharge: 2015-07-06 | Disposition: A | Discharge status: Home / Self Care | Payer: BLUE CROSS/BLUE SHIELD | Source: Ambulatory Visit | Attending: Radiation Oncology | Admitting: Radiation Oncology

## 2015-07-06 ENCOUNTER — Encounter: Payer: Self-pay | Admitting: Family

## 2015-07-06 ENCOUNTER — Other Ambulatory Visit (HOSPITAL_BASED_OUTPATIENT_CLINIC_OR_DEPARTMENT_OTHER): Payer: BLUE CROSS/BLUE SHIELD

## 2015-07-06 VITALS — BP 125/75 | HR 122 | Temp 97.9°F | Resp 18 | Ht 71.0 in | Wt 193.0 lb

## 2015-07-06 DIAGNOSIS — C9002 Multiple myeloma in relapse: Secondary | ICD-10-CM

## 2015-07-06 LAB — CBC WITH DIFFERENTIAL (CANCER CENTER ONLY)
BASO#: 0 10*3/uL (ref 0.0–0.2)
BASO%: 0 % (ref 0.0–2.0)
EOS%: 0.3 % (ref 0.0–7.0)
Eosinophils Absolute: 0 10*3/uL (ref 0.0–0.5)
HEMATOCRIT: 30 % — AB (ref 38.7–49.9)
HEMOGLOBIN: 9.9 g/dL — AB (ref 13.0–17.1)
LYMPH#: 0.6 10*3/uL — AB (ref 0.9–3.3)
LYMPH%: 18.5 % (ref 14.0–48.0)
MCH: 28 pg (ref 28.0–33.4)
MCHC: 33 g/dL (ref 32.0–35.9)
MCV: 85 fL (ref 82–98)
MONO#: 0.5 10*3/uL (ref 0.1–0.9)
MONO%: 15.3 % — ABNORMAL HIGH (ref 0.0–13.0)
NEUT%: 65.9 % (ref 40.0–80.0)
NEUTROS ABS: 2 10*3/uL (ref 1.5–6.5)
Platelets: 9 10*3/uL — CL (ref 145–400)
RBC: 3.53 10*6/uL — AB (ref 4.20–5.70)
RDW: 17.3 % — ABNORMAL HIGH (ref 11.1–15.7)
WBC: 3.1 10*3/uL — ABNORMAL LOW (ref 4.0–10.0)

## 2015-07-06 LAB — CMP (CANCER CENTER ONLY)
ALK PHOS: 56 U/L (ref 26–84)
ALT: 26 U/L (ref 10–47)
AST: 32 U/L (ref 11–38)
Albumin: 3 g/dL — ABNORMAL LOW (ref 3.3–5.5)
BILIRUBIN TOTAL: 0.9 mg/dL (ref 0.20–1.60)
BUN: 12 mg/dL (ref 7–22)
CALCIUM: 13.3 mg/dL — AB (ref 8.0–10.3)
CO2: 29 meq/L (ref 18–33)
Chloride: 100 mEq/L (ref 98–108)
Creat: 1.1 mg/dl (ref 0.6–1.2)
GLUCOSE: 128 mg/dL — AB (ref 73–118)
Potassium: 3.8 mEq/L (ref 3.3–4.7)
Sodium: 140 mEq/L (ref 128–145)
Total Protein: 6.2 g/dL — ABNORMAL LOW (ref 6.4–8.1)

## 2015-07-06 LAB — HOLD TUBE, BLOOD BANK - CHCC SATELLITE

## 2015-07-06 MED ORDER — SODIUM CHLORIDE 0.9 % IV SOLN
Freq: Once | INTRAVENOUS | Status: AC
  Administered 2015-07-06: 15:00:00 via INTRAVENOUS

## 2015-07-06 MED ORDER — CALCITONIN (SALMON) 200 UNIT/ML IJ SOLN
400.0000 [IU] | Freq: Once | INTRAMUSCULAR | Status: AC
  Administered 2015-07-06: 400 [IU] via INTRAMUSCULAR
  Filled 2015-07-06: qty 2

## 2015-07-06 MED ORDER — ZOLEDRONIC ACID 4 MG/100ML IV SOLN
4.0000 mg | Freq: Once | INTRAVENOUS | Status: AC
  Administered 2015-07-06: 4 mg via INTRAVENOUS
  Filled 2015-07-06: qty 100

## 2015-07-06 MED ORDER — ZOLEDRONIC ACID 4 MG/100ML IV SOLN
4.0000 mg | Freq: Once | INTRAVENOUS | Status: DC
  Filled 2015-07-06: qty 100

## 2015-07-06 MED ORDER — LORAZEPAM 0.5 MG PO TABS
0.5000 mg | ORAL_TABLET | Freq: Once | ORAL | Status: DC
  Filled 2015-07-06: qty 1

## 2015-07-06 NOTE — Patient Instructions (Signed)
Zoledronic Acid injection (Hypercalcemia, Oncology)  What is this medicine?  ZOLEDRONIC ACID (ZOE le dron ik AS id) lowers the amount of calcium loss from bone. It is used to treat too much calcium in your blood from cancer. It is also used to prevent complications of cancer that has spread to the bone.  This medicine may be used for other purposes; ask your health care provider or pharmacist if you have questions.  What should I tell my health care provider before I take this medicine?  They need to know if you have any of these conditions:  -aspirin-sensitive asthma  -cancer, especially if you are receiving medicines used to treat cancer  -dental disease or wear dentures  -infection  -kidney disease  -receiving corticosteroids like dexamethasone or prednisone  -an unusual or allergic reaction to zoledronic acid, other medicines, foods, dyes, or preservatives  -pregnant or trying to get pregnant  -breast-feeding  How should I use this medicine?  This medicine is for infusion into a vein. It is given by a health care professional in a hospital or clinic setting.  Talk to your pediatrician regarding the use of this medicine in children. Special care may be needed.  Overdosage: If you think you have taken too much of this medicine contact a poison control center or emergency room at once.  NOTE: This medicine is only for you. Do not share this medicine with others.  What if I miss a dose?  It is important not to miss your dose. Call your doctor or health care professional if you are unable to keep an appointment.  What may interact with this medicine?  -certain antibiotics given by injection  -NSAIDs, medicines for pain and inflammation, like ibuprofen or naproxen  -some diuretics like bumetanide, furosemide  -teriparatide  -thalidomide  This list may not describe all possible interactions. Give your health care provider a list of all the medicines, herbs, non-prescription drugs, or dietary supplements you use. Also  tell them if you smoke, drink alcohol, or use illegal drugs. Some items may interact with your medicine.  What should I watch for while using this medicine?  Visit your doctor or health care professional for regular checkups. It may be some time before you see the benefit from this medicine. Do not stop taking your medicine unless your doctor tells you to. Your doctor may order blood tests or other tests to see how you are doing.  Women should inform their doctor if they wish to become pregnant or think they might be pregnant. There is a potential for serious side effects to an unborn child. Talk to your health care professional or pharmacist for more information.  You should make sure that you get enough calcium and vitamin D while you are taking this medicine. Discuss the foods you eat and the vitamins you take with your health care professional.  Some people who take this medicine have severe bone, joint, and/or muscle pain. This medicine may also increase your risk for jaw problems or a broken thigh bone. Tell your doctor right away if you have severe pain in your jaw, bones, joints, or muscles. Tell your doctor if you have any pain that does not go away or that gets worse.  Tell your dentist and dental surgeon that you are taking this medicine. You should not have major dental surgery while on this medicine. See your dentist to have a dental exam and fix any dental problems before starting this medicine. Take good care   your doctor or health care professional as soon as possible: -allergic reactions like skin rash, itching or hives, swelling of the face, lips, or tongue -anxiety, confusion, or depression -breathing problems -changes in vision -eye pain -feeling faint or lightheaded, falls -jaw pain,  especially after dental work -mouth sores -muscle cramps, stiffness, or weakness -redness, blistering, peeling or loosening of the skin, including inside the mouth -trouble passing urine or change in the amount of urine Side effects that usually do not require medical attention (report to your doctor or health care professional if they continue or are bothersome): -bone, joint, or muscle pain -constipation -diarrhea -fever -hair loss -irritation at site where injected -loss of appetite -nausea, vomiting -stomach upset -trouble sleeping -trouble swallowing -weak or tired This list may not describe all possible side effects. Call your doctor for medical advice about side effects. You may report side effects to FDA at 1-800-FDA-1088. Where should I keep my medicine? This drug is given in a hospital or clinic and will not be stored at home. NOTE: This sheet is a summary. It may not cover all possible information. If you have questions about this medicine, talk to your doctor, pharmacist, or health care provider.    2016, Elsevier/Gold Standard. (2013-12-14 14:19:39)   Dehydration, Adult Dehydration is a condition in which you do not have enough fluid or water in your body. It happens when you take in less fluid than you lose. Vital organs such as the kidneys, brain, and heart cannot function without a proper amount of fluids. Any loss of fluids from the body can cause dehydration.  Dehydration can range from mild to severe. This condition should be treated right away to help prevent it from becoming severe. CAUSES  This condition may be caused by:  Vomiting.  Diarrhea.  Excessive sweating, such as when exercising in hot or humid weather.  Not drinking enough fluid during strenuous exercise or during an illness.  Excessive urine output.  Fever.  Certain medicines. RISK FACTORS This condition is more likely to develop in:  People who are taking certain medicines that cause  the body to lose excess fluid (diuretics).   People who have a chronic illness, such as diabetes, that may increase urination.  Older adults.   People who live at high altitudes.   People who participate in endurance sports.  SYMPTOMS  Mild Dehydration  Thirst.  Dry lips.  Slightly dry mouth.  Dry, warm skin. Moderate Dehydration  Very dry mouth.   Muscle cramps.   Dark urine and decreased urine production.   Decreased tear production.   Headache.   Light-headedness, especially when you stand up from a sitting position.  Severe Dehydration  Changes in skin.   Cold and clammy skin.   Skin does not spring back quickly when lightly pinched and released.   Changes in body fluids.   Extreme thirst.   No tears.   Not able to sweat when body temperature is high, such as in hot weather.   Minimal urine production.   Changes in vital signs.   Rapid, weak pulse (more than 100 beats per minute when you are sitting still).   Rapid breathing.   Low blood pressure.   Other changes.   Sunken eyes.   Cold hands and feet.   Confusion.  Lethargy and difficulty being awakened.  Fainting (syncope).   Short-term weight loss.   Unconsciousness. DIAGNOSIS  This condition may be diagnosed based on your symptoms. You may also have tests   to determine how severe your dehydration is. These tests may include:   Urine tests.   Blood tests.  TREATMENT  Treatment for this condition depends on the severity. Mild or moderate dehydration can often be treated at home. Treatment should be started right away. Do not wait until dehydration becomes severe. Severe dehydration needs to be treated at the hospital. Treatment for Mild Dehydration  Drinking plenty of water to replace the fluid you have lost.   Replacing minerals in your blood (electrolytes) that you may have lost.  Treatment for Moderate Dehydration  Consuming oral rehydration  solution (ORS). Treatment for Severe Dehydration  Receiving fluid through an IV tube.   Receiving electrolyte solution through a feeding tube that is passed through your nose and into your stomach (nasogastric tube or NG tube).  Correcting any abnormalities in electrolytes. HOME CARE INSTRUCTIONS   Drink enough fluid to keep your urine clear or pale yellow.   Drink water or fluid slowly by taking small sips. You can also try sucking on ice cubes.  Have food or beverages that contain electrolytes. Examples include bananas and sports drinks.  Take over-the-counter and prescription medicines only as told by your health care provider.   Prepare ORS according to the manufacturer's instructions. Take sips of ORS every 5 minutes until your urine returns to normal.  If you have vomiting or diarrhea, continue to try to drink water, ORS, or both.   If you have diarrhea, avoid:   Beverages that contain caffeine.   Fruit juice.   Milk.   Carbonated soft drinks.  Do not take salt tablets. This can lead to the condition of having too much sodium in your body (hypernatremia).  SEEK MEDICAL CARE IF:  You cannot eat or drink without vomiting.  You have had moderate diarrhea during a period of more than 24 hours.  You have a fever. SEEK IMMEDIATE MEDICAL CARE IF:   You have extreme thirst.  You have severe diarrhea.  You have not urinated in 6-8 hours, or you have urinated only a small amount of very dark urine.  You have shriveled skin.  You are dizzy, confused, or both.   This information is not intended to replace advice given to you by your health care provider. Make sure you discuss any questions you have with your health care provider.   Document Released: 07/18/2005 Document Revised: 04/08/2015 Document Reviewed: 12/03/2014 Elsevier Interactive Patient Education 2016 [employer].   Calcitonin injection What is this medicine? CALCITONIN (kal si TOE nin) is  a hormone. It helps control calcium in the body. It is used to treat Paget's disease, osteoporosis, and hypercalcemia. This medicine may be used for other purposes; ask your health care provider or pharmacist if you have questions. What should I tell my health care provider before I take this medicine? They need to know if you have any of these conditions: -bone cancer -low level of blood calcium -an unusual or allergic reaction to calcitonin, fish, other medicines, foods, dyes, or preservatives -pregnant or trying to get pregnant -breast-feeding How should I use this medicine? This medicine is for injection under the skin or into a muscle. You will be taught how to prepare and give this medicine. Use exactly as directed. Take your medicine at regular intervals. Do not take your medicine more often than directed. It is important that you put your used needles and syringes in a special sharps container. Do not put them in a trash can. If you do  not have a sharps container, call your pharmacist or healthcare provider to get one. Talk to your pediatrician regarding the use of this medicine in children. Special care may be needed. Patients over 70 years old may have a stronger reaction and need a smaller dose. Overdosage: If you think you have taken too much of this medicine contact a poison control center or emergency room at once. NOTE: This medicine is only for you. Do not share this medicine with others. What if I miss a dose? If you miss a dose, use it as soon as you can. If it is almost time for your next dose, use only that dose. Do not use double or extra doses. What may interact with this medicine? -lithium This list may not describe all possible interactions. Give your health care provider a list of all the medicines, herbs, non-prescription drugs, or dietary supplements you use. Also tell them if you smoke, drink alcohol, or use illegal drugs. Some items may interact with your  medicine. What should I watch for while using this medicine? Visit your doctor or health care professional for regular checks on your progress. You will need regular blood tests while using this medicine. Talk to your doctor about your risk of cancer. You may be more at risk for certain types of cancers if you take this medicine. You may need to be on a special diet while taking this medicine. Check with your doctor. Ask if you need to take extra calcium or vitamin D while taking this medicine. What side effects may I notice from receiving this medicine? Side effects that you should report to your doctor or health care professional as soon as possible: -allergic reactions like skin rash, itching or hives, swelling of the face, lips, or tongue -breathing problems -chest pain, tightness -dizziness -fever, chills -tingling in the hands, feet Side effects that usually do not require medical attention (report to your doctor or health care professional if they continue or are bothersome): -back or joint pain -flushing -loss of appetite -nausea, vomiting -pain, swelling where injected -stomach pain -swollen feet -tremors This list may not describe all possible side effects. Call your doctor for medical advice about side effects. You may report side effects to FDA at 1-800-FDA-1088. Where should I keep my medicine? Keep out of the reach of children. Store in a refrigerator between 2 and 8 degrees C (36 and 46 degrees F). Do not freeze. Throw away any unused medicine after the expiration date. NOTE: This sheet is a summary. It may not cover all possible information. If you have questions about this medicine, talk to your doctor, pharmacist, or health care provider.    2016, Elsevier/Gold Standard. (2012-10-11 12:10:57)

## 2015-07-06 NOTE — Progress Notes (Signed)
Received call from [facility] saying patient did not take his antianxiety meds, lorazepam 0.5 mg and was not able complete treatment today without it.  Verbal order received from Dr. Tammi Klippel for lorazepam 0.5 mg.  Patient refused the lorazepam and said he would just like to add today's treatment to the end and to cancel today.  He asked when was the best time to take the lorazepam before treatment.  Advised him to take it 60 minutes before his treatment time.  He verbalized agreement. Returned lorazepam tablet to the pyxis.

## 2015-07-07 ENCOUNTER — Inpatient Hospital Stay: Admission: RE | Admit: 2015-07-07 | Payer: Self-pay | Source: Ambulatory Visit | Admitting: Radiation Oncology

## 2015-07-07 ENCOUNTER — Ambulatory Visit (HOSPITAL_COMMUNITY)
Admission: RE | Admit: 2015-07-07 | Discharge: 2015-07-07 | Disposition: A | Discharge status: Home / Self Care | Payer: BLUE CROSS/BLUE SHIELD | Source: Ambulatory Visit | Attending: Hematology & Oncology | Admitting: Hematology & Oncology

## 2015-07-07 ENCOUNTER — Other Ambulatory Visit (HOSPITAL_BASED_OUTPATIENT_CLINIC_OR_DEPARTMENT_OTHER): Payer: BLUE CROSS/BLUE SHIELD

## 2015-07-07 ENCOUNTER — Ambulatory Visit: Payer: BLUE CROSS/BLUE SHIELD | Admitting: Family

## 2015-07-07 ENCOUNTER — Ambulatory Visit: Payer: BLUE CROSS/BLUE SHIELD

## 2015-07-07 ENCOUNTER — Other Ambulatory Visit: Payer: Self-pay | Admitting: Family

## 2015-07-07 ENCOUNTER — Other Ambulatory Visit: Payer: BLUE CROSS/BLUE SHIELD

## 2015-07-07 ENCOUNTER — Ambulatory Visit (HOSPITAL_BASED_OUTPATIENT_CLINIC_OR_DEPARTMENT_OTHER): Payer: BLUE CROSS/BLUE SHIELD

## 2015-07-07 DIAGNOSIS — C9002 Multiple myeloma in relapse: Secondary | ICD-10-CM | POA: Diagnosis not present

## 2015-07-07 LAB — CMP (CANCER CENTER ONLY)
ALBUMIN: 2.8 g/dL — AB (ref 3.3–5.5)
ALT(SGPT): 28 U/L (ref 10–47)
AST: 34 U/L (ref 11–38)
Alkaline Phosphatase: 52 U/L (ref 26–84)
BILIRUBIN TOTAL: 0.7 mg/dL (ref 0.20–1.60)
BUN: 9 mg/dL (ref 7–22)
CO2: 27 mEq/L (ref 18–33)
CREATININE: 0.8 mg/dL (ref 0.6–1.2)
Calcium: 12.5 mg/dL — ABNORMAL HIGH (ref 8.0–10.3)
Chloride: 99 mEq/L (ref 98–108)
Glucose, Bld: 191 mg/dL — ABNORMAL HIGH (ref 73–118)
Potassium: 3.4 mEq/L (ref 3.3–4.7)
SODIUM: 136 meq/L (ref 128–145)
TOTAL PROTEIN: 6 g/dL — AB (ref 6.4–8.1)

## 2015-07-07 MED ORDER — SODIUM CHLORIDE 0.9 % IJ SOLN
10.0000 mL | INTRAMUSCULAR | Status: AC | PRN
  Administered 2015-07-07: 10 mL
  Filled 2015-07-07: qty 10

## 2015-07-07 MED ORDER — CALCITONIN (SALMON) 200 UNIT/ML IJ SOLN
700.0000 [IU] | Freq: Once | INTRAMUSCULAR | Status: DC
  Filled 2015-07-07: qty 3.5

## 2015-07-07 MED ORDER — CALCITONIN (SALMON) 200 UNIT/ML IJ SOLN
700.0000 [IU] | Freq: Once | INTRAMUSCULAR | Status: AC
  Administered 2015-07-07: 700 [IU] via INTRAMUSCULAR
  Filled 2015-07-07: qty 3.5

## 2015-07-07 MED ORDER — SODIUM CHLORIDE 0.9 % IV SOLN
250.0000 mL | Freq: Once | INTRAVENOUS | Status: AC
  Administered 2015-07-07: 250 mL via INTRAVENOUS

## 2015-07-07 MED ORDER — HEPARIN SOD (PORK) LOCK FLUSH 100 UNIT/ML IV SOLN
500.0000 [IU] | Freq: Every day | INTRAVENOUS | Status: AC | PRN
  Administered 2015-07-07: 500 [IU]
  Filled 2015-07-07: qty 5

## 2015-07-07 NOTE — Patient Instructions (Signed)
Dehydration, Adult Dehydration is a condition in which you do not have enough fluid or water in your body. It happens when you take in less fluid than you lose. Vital organs such as the kidneys, brain, and heart cannot function without a proper amount of fluids. Any loss of fluids from the body can cause dehydration.  Dehydration can range from mild to severe. This condition should be treated right away to help prevent it from becoming severe. CAUSES  This condition may be caused by:  Vomiting.  Diarrhea.  Excessive sweating, such as when exercising in hot or humid weather.  Not drinking enough fluid during strenuous exercise or during an illness.  Excessive urine output.  Fever.  Certain medicines. RISK FACTORS This condition is more likely to develop in:  People who are taking certain medicines that cause the body to lose excess fluid (diuretics).   People who have a chronic illness, such as diabetes, that may increase urination.  Older adults.   People who live at high altitudes.   People who participate in endurance sports.  SYMPTOMS  Mild Dehydration  Thirst.  Dry lips.  Slightly dry mouth.  Dry, warm skin. Moderate Dehydration  Very dry mouth.   Muscle cramps.   Dark urine and decreased urine production.   Decreased tear production.   Headache.   Light-headedness, especially when you stand up from a sitting position.  Severe Dehydration  Changes in skin.   Cold and clammy skin.   Skin does not spring back quickly when lightly pinched and released.   Changes in body fluids.   Extreme thirst.   No tears.   Not able to sweat when body temperature is high, such as in hot weather.   Minimal urine production.   Changes in vital signs.   Rapid, weak pulse (more than 100 beats per minute when you are sitting still).   Rapid breathing.   Low blood pressure.   Other changes.   Sunken eyes.   Cold hands and feet.    Confusion.  Lethargy and difficulty being awakened.  Fainting (syncope).   Short-term weight loss.   Unconsciousness. DIAGNOSIS  This condition may be diagnosed based on your symptoms. You may also have tests to determine how severe your dehydration is. These tests may include:   Urine tests.   Blood tests.  TREATMENT  Treatment for this condition depends on the severity. Mild or moderate dehydration can often be treated at home. Treatment should be started right away. Do not wait until dehydration becomes severe. Severe dehydration needs to be treated at the hospital. Treatment for Mild Dehydration  Drinking plenty of water to replace the fluid you have lost.   Replacing minerals in your blood (electrolytes) that you may have lost.  Treatment for Moderate Dehydration  Consuming oral rehydration solution (ORS). Treatment for Severe Dehydration  Receiving fluid through an IV tube.   Receiving electrolyte solution through a feeding tube that is passed through your nose and into your stomach (nasogastric tube or NG tube).  Correcting any abnormalities in electrolytes. HOME CARE INSTRUCTIONS   Drink enough fluid to keep your urine clear or pale yellow.   Drink water or fluid slowly by taking small sips. You can also try sucking on ice cubes.  Have food or beverages that contain electrolytes. Examples include bananas and sports drinks.  Take over-the-counter and prescription medicines only as told by your health care provider.   Prepare ORS according to the manufacturer's instructions. Take sips   of ORS every 5 minutes until your urine returns to normal.  If you have vomiting or diarrhea, continue to try to drink water, ORS, or both.   If you have diarrhea, avoid:   Beverages that contain caffeine.   Fruit juice.   Milk.   Carbonated soft drinks.  Do not take salt tablets. This can lead to the condition of having too much sodium in your body  (hypernatremia).  SEEK MEDICAL CARE IF:  You cannot eat or drink without vomiting.  You have had moderate diarrhea during a period of more than 24 hours.  You have a fever. SEEK IMMEDIATE MEDICAL CARE IF:   You have extreme thirst.  You have severe diarrhea.  You have not urinated in 6-8 hours, or you have urinated only a small amount of very dark urine.  You have shriveled skin.  You are dizzy, confused, or both.   This information is not intended to replace advice given to you by your health care provider. Make sure you discuss any questions you have with your health care provider.   Document Released: 07/18/2005 Document Revised: 04/08/2015 Document Reviewed: 12/03/2014 Elsevier Interactive Patient Education 2016 [place].   Calcitonin injection What is this medicine? CALCITONIN (kal si TOE nin) is a hormone. It helps control calcium in the body. It is used to treat Paget's disease, osteoporosis, and hypercalcemia. This medicine may be used for other purposes; ask your health care provider or pharmacist if you have questions. What should I tell my health care provider before I take this medicine? They need to know if you have any of these conditions: -bone cancer -low level of blood calcium -an unusual or allergic reaction to calcitonin, fish, other medicines, foods, dyes, or preservatives -pregnant or trying to get pregnant -breast-feeding How should I use this medicine? This medicine is for injection under the skin or into a muscle. You will be taught how to prepare and give this medicine. Use exactly as directed. Take your medicine at regular intervals. Do not take your medicine more often than directed. It is important that you put your used needles and syringes in a special sharps container. Do not put them in a trash can. If you do not have a sharps container, call your pharmacist or healthcare provider to get one. Talk to your pediatrician regarding the use of  this medicine in children. Special care may be needed. Patients over 18 years old may have a stronger reaction and need a smaller dose. Overdosage: If you think you have taken too much of this medicine contact a poison control center or emergency room at once. NOTE: This medicine is only for you. Do not share this medicine with others. What if I miss a dose? If you miss a dose, use it as soon as you can. If it is almost time for your next dose, use only that dose. Do not use double or extra doses. What may interact with this medicine? -lithium This list may not describe all possible interactions. Give your health care provider a list of all the medicines, herbs, non-prescription drugs, or dietary supplements you use. Also tell them if you smoke, drink alcohol, or use illegal drugs. Some items may interact with your medicine. What should I watch for while using this medicine? Visit your doctor or health care professional for regular checks on your progress. You will need regular blood tests while using this medicine. Talk to your doctor about your risk of cancer. You may be  more at risk for certain types of cancers if you take this medicine. You may need to be on a special diet while taking this medicine. Check with your doctor. Ask if you need to take extra calcium or vitamin D while taking this medicine. What side effects may I notice from receiving this medicine? Side effects that you should report to your doctor or health care professional as soon as possible: -allergic reactions like skin rash, itching or hives, swelling of the face, lips, or tongue -breathing problems -chest pain, tightness -dizziness -fever, chills -tingling in the hands, feet Side effects that usually do not require medical attention (report to your doctor or health care professional if they continue or are bothersome): -back or joint pain -flushing -loss of appetite -nausea, vomiting -pain, swelling where  injected -stomach pain -swollen feet -tremors This list may not describe all possible side effects. Call your doctor for medical advice about side effects. You may report side effects to FDA at 1-800-FDA-1088. Where should I keep my medicine? Keep out of the reach of children. Store in a refrigerator between 2 and 8 degrees C (36 and 46 degrees F). Do not freeze. Throw away any unused medicine after the expiration date. NOTE: This sheet is a summary. It may not cover all possible information. If you have questions about this medicine, talk to your doctor, pharmacist, or health care provider.    2016, Elsevier/Gold Standard. (2012-10-11 12:10:57)  Platelet Transfusion  A platelet transfusion is a procedure in which you receive donated platelets through an IV tube. Platelets are tiny pieces of blood cells. When a blood vessel is damaged, platelets collect in the damaged area to help form a blood clot. This begins the healing process. If your platelet count gets too low, your blood may have trouble clotting.  You may need a platelet transfusion if you have a condition that causes a low number of platelets (thrombocytopenia). A platelet transfusion may be used to stop or prevent bleeding.  LET [facility] CARE PROVIDER KNOW ABOUT:   Any allergies you have.   All medicines you are taking, including vitamins, herbs, eye drops, creams, and over-the-counter medicines.   Previous problems you or members of your family have had with the use of anesthetics.   Any blood disorders you have.   Previous surgeries you have had.   Any medical conditions you may have.   Any reactions you have had during a previous transfusion. RISKS AND COMPLICATIONS Generally, this is a safe procedure. However, problems may occur, including:   Fever with or without chills. The fever usually occurs within the first 4 hours of the transfusion and returns to normal within 48 hours.  Allergic reaction. The  reaction is most commonly caused by antibodies your body creates against substances in the transfusion. Signs of an allergic reaction may include itching, hives, difficulty breathing, shock, or low blood pressure.  Sudden (acute) or delayed hemolytic reaction. This rare reaction can occur during the transfusion and up to 28 days after the transfusion. The reaction usually occurs when your body's defense system (immune system) attacks the new platelets. Signs of a hemolytic reaction may include fever, headache, difficulty breathing, low blood pressure, a rapid heartbeat, or pain in your back, abdomen, chest, or IV site.  Transfusion-related acute lung injury (TRALI). TRALI can occur within hours of a transfusion, or several days later. This is a rare reaction that causes lung damage. The cause is not known.  Infection. Signs of this rare  complication may include fever, chills, vomiting, a rapid heartbeat, or low blood pressure. BEFORE THE PROCEDURE   You may have a blood test to determine your blood type. This is necessary to find out what kind ofplatelets best matches your platelets.  If you have had an allergic reaction to a transfusion in the past, you may be given medicine to help prevent a reaction. Take this medicine only as directed by your health care provider.  Your temperature, blood pressure, and pulse will be monitored before the transfusion. PROCEDURE  An IV will be started in your hand or arm.  The transfusion will be attached to your IV tubing. The bag of donated platelets will be attached to your IV tube andgiven into your vein.  Your temperature, blood pressure, and pulse will be monitored regularly during the transfusion. This monitoring is done to help detect early signs of a transfusion reaction.  If you have any signs or symptoms of a reaction, your transfusion will be stopped and you may be given medicine.  When your transfusion is complete, your IV will be  removed.  Pressure may be applied to the IV site for a few minutes.  A bandage (dressing) will be applied. The procedure may vary among health care providers and hospitals. AFTER THE PROCEDURE  Your blood pressure, temperature, and pulse will be monitored regularly.   This information is not intended to replace advice given to you by your health care provider. Make sure you discuss any questions you have with your health care provider.   Document Released: 05/15/2007 Document Revised: 08/08/2014 Document Reviewed: 05/28/2014 Elsevier Interactive Patient Education [employer].

## 2015-07-07 NOTE — Progress Notes (Signed)
Hematology and Oncology Follow Up Visit  Koh Amor [id number] 1971/07/27 44 y.o. 07/07/2015   Principle Diagnosis:  Relapsed kappa light chain myeloma - July 2016 - transplant at [facility] in February 2015  Recurrent hypercalcemia   Current Therapy:    Zometa q 28 days     Interim History:  Mr. Jankiewicz is here today for a follow-up. He was admitted again to [facility] last week before last with hypercalcemia and staph epi bacteremia. He is still weak and fatigued.  His calcium today is 13.3.  His appetite is starting to improve. He is drinking some fluids. His weight is stable at 193.  No fever, n.v, cough, rash, dizziness, SOB, chest pain, palpitations, abdominal pain or changes in bowel or bladder habits. He is using Dulcolax as needed to help relieve constipation.   No episodes of bleeding or bruising. His platelet count today is 9. Hgb is stable at 9.9 with an MCV of 85.  He is receiving palliative radiation to the pathologic fracture at C5. Today was fraction 4 of 10. At this time his pain is being affectively managed with flexeril and MSIR.  The numbness and tingling in his feet is unchanged. No swelling in his extremities.  Dr. Marin Olp spoke with him in the hospital about starting treatment with Revlimid/Dexamethasone/Elotuzumab and he wants to proceed. We will begin this week.   Medications:    Medication List       This list is accurate as of: 07/06/15 11:59 PM.  Always use your most recent med list.               bisacodyl 5 MG EC tablet  Commonly known as:  DULCOLAX  Take 5 mg by mouth daily as needed for moderate constipation (constipation).     cyclobenzaprine 10 MG tablet  Commonly known as:  FLEXERIL  Take 1 tablet (10 mg total) by mouth 3 (three) times daily as needed for muscle spasms.     famciclovir 500 MG tablet  Commonly known as:  FAMVIR  Take 1 tablet (500 mg total) by mouth daily.     fentaNYL 100 MCG/HR  Commonly known as:  DURAGESIC -  dosed mcg/hr  Place 2 patches (200 mcg total) onto the skin every other day.     gabapentin 300 MG capsule  Commonly known as:  NEURONTIN  Take 2 capsules (600 mg total) by mouth 3 (three) times daily.     lidocaine-prilocaine cream  Commonly known as:  EMLA  Apply to affected area once     LORazepam 0.5 MG tablet  Commonly known as:  ATIVAN  Take 1 tablet (0.5 mg total) by mouth every 6 (six) hours as needed (Nausea or vomiting).     morphine 30 MG tablet  Commonly known as:  MSIR  Take 1/2-1 pill, IF NEEDED, every 4 hrs for pain.     ondansetron 8 MG disintegrating tablet  Commonly known as:  ZOFRAN ODT  Take 1 tablet (8 mg total) by mouth every 8 (eight) hours as needed for nausea or vomiting.     pantoprazole 40 MG tablet  Commonly known as:  PROTONIX  Take 1 tablet (40 mg total) by mouth daily.     prochlorperazine 10 MG tablet  Commonly known as:  COMPAZINE  Take 1 tablet (10 mg total) by mouth every 6 (six) hours as needed (Nausea or vomiting).        Allergies: No Known Allergies  Past Medical History, Surgical history, Social  history, and Family History were reviewed and updated.  Review of Systems: All other 10 point review of systems is negative.   Physical Exam:  height is 5\' 11"  (1.803 m) and weight is 193 lb (87.544 kg). His oral temperature is 97.9 F (36.6 C). His blood pressure is 125/75 and his pulse is 122. His respiration is 18.   Wt Readings from Last 3 Encounters:  07/06/15 193 lb (87.544 kg)  06/25/15 194 lb 0.1 oz (88 kg)  06/24/15 195 lb (88.451 kg)    Ocular: Sclerae unicteric, pupils equal, round and reactive to light Ear-nose-throat: Oropharynx clear, dentition fair Lymphatic: No cervical supraclavicular or axillary adenopathy Lungs no rales or rhonchi, good excursion bilaterally Heart regular rate and rhythm, no murmur appreciated Abd soft, nontender, positive bowel sounds MSK no focal spinal tenderness, no joint edema Neuro:  non-focal, well-oriented, appropriate affect Breasts: Deferred  Lab Results  Component Value Date   WBC 3.1* 07/06/2015   HGB 9.9* 07/06/2015   HCT 30.0* 07/06/2015   MCV 85 07/06/2015   PLT 9* 07/06/2015   No results found for: FERRITIN, IRON, TIBC, UIBC, IRONPCTSAT Lab Results  Component Value Date   RBC 3.53* 07/06/2015   Lab Results  Component Value Date   KPAFRELGTCHN 1022.90* 07/02/2015   LAMBDASER 1.96* 07/02/2015   KAPLAMBRATIO 521.89* 07/02/2015   Lab Results  Component Value Date   IGGSERUM 351* 05/19/2015   IGA 7* 05/19/2015   IGMSERUM <5* 05/19/2015   Lab Results  Component Value Date   TOTALPROTELP 6.3 05/19/2015   ALBUMINELP 3.8 05/19/2015   A1GS 0.5* 05/19/2015   A2GS 1.0* 05/19/2015   BETS 0.3* 05/19/2015   BETA2SER 0.4 05/19/2015   GAMS 0.4* 05/19/2015   MSPIKE 71.7* 06/10/2015   SPEI * 05/19/2015     Chemistry      Component Value Date/Time   NA 140 07/06/2015 1319   NA 141 07/04/2015 0515   NA 133* 06/24/2015 1518   K 3.8 07/06/2015 1319   K 3.8 07/04/2015 0515   K 3.6 06/24/2015 1518   CL 100 07/06/2015 1319   CL 105 07/04/2015 0515   CO2 29 07/06/2015 1319   CO2 30 07/04/2015 0515   CO2 28 06/24/2015 1518   BUN 12 07/06/2015 1319   BUN 10 07/04/2015 0515   BUN 21.3 06/24/2015 1518   CREATININE 1.1 07/06/2015 1319   CREATININE 0.91 07/04/2015 0515   CREATININE 1.1 06/24/2015 1518      Component Value Date/Time   CALCIUM 13.3* 07/06/2015 1319   CALCIUM 10.8* 07/04/2015 0515   CALCIUM 15.5* 06/24/2015 1518   CALCIUM >15.0* 02/01/2013 1230   ALKPHOS 56 07/06/2015 1319   ALKPHOS 52 07/04/2015 0515   ALKPHOS 101 06/24/2015 1518   AST 32 07/06/2015 1319   AST 31 07/04/2015 0515   AST 56* 06/24/2015 1518   ALT 26 07/06/2015 1319   ALT 23 07/04/2015 0515   ALT 46 06/24/2015 1518   BILITOT 0.90 07/06/2015 1319   BILITOT 1.1 07/04/2015 0515   BILITOT 1.94* 06/24/2015 1518     Impression and Plan: Mr. Espinosa is 44 year old African  American male with relapsed kappa light chain myeloma and recurrent hypercalcemia. He underwent a stem cell transplant for his myeloma on September 11, 2013 at [facility] and unfortunately relapsed. His allogenic transplant has been put on hold due to his relapse. He was hospitalized again last week with hypercalcemia and staph epi bacteremia.  His calcium today is 13.3 so we  will give him a dose of Calcitonin while he is here today. We will check his calcium again tomorrow and give him another dose of Calcitonin if needed.  We will also give him IV fluids.  His platelet count is 9. He has had no episodes of bleeding. We will give him platelets tomorrow. He will receive a total of 10 fractions of palliative radiation for his pathologic fracture at C5.  He will start treatment on Thursday (pending insurance approval) with Lenalidomide/Dexamethasone/Elotuzumab.  We will give him a new appointment schedule today.  He will contact us with any questions or concerns. We can always see him sooner if need be.   [name], NP 12/6/201610:42 AM

## 2015-07-08 ENCOUNTER — Telehealth: Payer: Self-pay | Admitting: Hematology & Oncology

## 2015-07-08 ENCOUNTER — Ambulatory Visit: Payer: BLUE CROSS/BLUE SHIELD

## 2015-07-08 LAB — PREPARE PLATELET PHERESIS: UNIT DIVISION: 0

## 2015-07-09 ENCOUNTER — Ambulatory Visit
Admission: RE | Admit: 2015-07-09 | Discharge: 2015-07-09 | Disposition: A | Discharge status: Home / Self Care | Payer: BLUE CROSS/BLUE SHIELD | Source: Ambulatory Visit | Attending: Radiation Oncology | Admitting: Radiation Oncology

## 2015-07-09 ENCOUNTER — Encounter: Payer: Self-pay | Admitting: Family

## 2015-07-09 ENCOUNTER — Encounter: Payer: Self-pay | Admitting: Radiation Oncology

## 2015-07-09 ENCOUNTER — Telehealth: Payer: Self-pay | Admitting: *Deleted

## 2015-07-09 ENCOUNTER — Ambulatory Visit (HOSPITAL_BASED_OUTPATIENT_CLINIC_OR_DEPARTMENT_OTHER): Payer: BLUE CROSS/BLUE SHIELD

## 2015-07-09 ENCOUNTER — Other Ambulatory Visit: Payer: Self-pay | Admitting: Hematology & Oncology

## 2015-07-09 ENCOUNTER — Telehealth: Payer: Self-pay | Admitting: Hematology & Oncology

## 2015-07-09 ENCOUNTER — Ambulatory Visit (HOSPITAL_BASED_OUTPATIENT_CLINIC_OR_DEPARTMENT_OTHER): Payer: BLUE CROSS/BLUE SHIELD | Admitting: Family

## 2015-07-09 VITALS — BP 154/111 | HR 145 | Temp 98.1°F | Resp 16 | Ht 71.0 in | Wt 190.0 lb

## 2015-07-09 VITALS — BP 135/90 | HR 137 | Temp 98.9°F | Ht 71.0 in | Wt 191.3 lb

## 2015-07-09 DIAGNOSIS — C9002 Multiple myeloma in relapse: Secondary | ICD-10-CM

## 2015-07-09 DIAGNOSIS — Z5112 Encounter for antineoplastic immunotherapy: Secondary | ICD-10-CM

## 2015-07-09 DIAGNOSIS — D61810 Antineoplastic chemotherapy induced pancytopenia: Secondary | ICD-10-CM

## 2015-07-09 DIAGNOSIS — G620 Drug-induced polyneuropathy: Secondary | ICD-10-CM | POA: Diagnosis not present

## 2015-07-09 DIAGNOSIS — Z510 Encounter for antineoplastic radiation therapy: Secondary | ICD-10-CM | POA: Diagnosis present

## 2015-07-09 DIAGNOSIS — T451X5A Adverse effect of antineoplastic and immunosuppressive drugs, initial encounter: Principal | ICD-10-CM

## 2015-07-09 DIAGNOSIS — C9000 Multiple myeloma not having achieved remission: Secondary | ICD-10-CM

## 2015-07-09 LAB — CMP (CANCER CENTER ONLY)
ALBUMIN: 3.1 g/dL — AB (ref 3.3–5.5)
ALT(SGPT): 25 U/L (ref 10–47)
AST: 37 U/L (ref 11–38)
Alkaline Phosphatase: 69 U/L (ref 26–84)
BUN: 11 mg/dL (ref 7–22)
CHLORIDE: 96 meq/L — AB (ref 98–108)
CO2: 24 meq/L (ref 18–33)
CREATININE: 0.9 mg/dL (ref 0.6–1.2)
Calcium: 13.5 mg/dL (ref 8.0–10.3)
Glucose, Bld: 180 mg/dL — ABNORMAL HIGH (ref 73–118)
POTASSIUM: 4.2 meq/L (ref 3.3–4.7)
SODIUM: 137 meq/L (ref 128–145)
Total Bilirubin: 1 mg/dl (ref 0.20–1.60)
Total Protein: 6.3 g/dL — ABNORMAL LOW (ref 6.4–8.1)

## 2015-07-09 LAB — CBC WITH DIFFERENTIAL (CANCER CENTER ONLY)
BASO#: 0 10*3/uL (ref 0.0–0.2)
BASO%: 0.3 % (ref 0.0–2.0)
EOS%: 0 % (ref 0.0–7.0)
Eosinophils Absolute: 0 10*3/uL (ref 0.0–0.5)
HCT: 32.1 % — ABNORMAL LOW (ref 38.7–49.9)
HEMOGLOBIN: 10.4 g/dL — AB (ref 13.0–17.1)
LYMPH#: 1.2 10*3/uL (ref 0.9–3.3)
LYMPH%: 31 % (ref 14.0–48.0)
MCH: 27.6 pg — ABNORMAL LOW (ref 28.0–33.4)
MCHC: 32.4 g/dL (ref 32.0–35.9)
MCV: 85 fL (ref 82–98)
MONO#: 0.7 10*3/uL (ref 0.1–0.9)
MONO%: 16.8 % — AB (ref 0.0–13.0)
NEUT%: 51.9 % (ref 40.0–80.0)
NEUTROS ABS: 2 10*3/uL (ref 1.5–6.5)
PLATELETS: 11 10*3/uL — AB (ref 145–400)
RBC: 3.77 10*6/uL — AB (ref 4.20–5.70)
RDW: 17.1 % — ABNORMAL HIGH (ref 11.1–15.7)
WBC: 3.9 10*3/uL — AB (ref 4.0–10.0)

## 2015-07-09 LAB — HOLD TUBE, BLOOD BANK - CHCC SATELLITE

## 2015-07-09 LAB — TECHNOLOGIST REVIEW CHCC SATELLITE

## 2015-07-09 MED ORDER — SODIUM CHLORIDE 0.9 % IV SOLN
Freq: Once | INTRAVENOUS | Status: AC
  Administered 2015-07-09: 13:00:00 via INTRAVENOUS
  Filled 2015-07-09: qty 4

## 2015-07-09 MED ORDER — ONDANSETRON HCL 8 MG PO TABS: 8.0000 mg | ORAL_TABLET | Freq: Two times a day (BID) | ORAL | Status: DC

## 2015-07-09 MED ORDER — DEXTROSE 5 % IV SOLN
27.0000 mg/m2 | Freq: Once | INTRAVENOUS | Status: AC
  Administered 2015-07-09: 56 mg via INTRAVENOUS
  Filled 2015-07-09: qty 28

## 2015-07-09 MED ORDER — HEPARIN SOD (PORK) LOCK FLUSH 100 UNIT/ML IV SOLN
500.0000 [IU] | Freq: Once | INTRAVENOUS | Status: AC | PRN
  Administered 2015-07-09: 500 [IU]
  Filled 2015-07-09: qty 5

## 2015-07-09 MED ORDER — MORPHINE SULFATE 10 MG/ML IJ SOLN
6.0000 mg | Freq: Once | INTRAMUSCULAR | Status: AC
  Administered 2015-07-09: 6 mg via INTRAVENOUS
  Filled 2015-07-09: qty 1

## 2015-07-09 MED ORDER — MORPHINE SULFATE (PF) 10 MG/ML IV SOLN
INTRAVENOUS | Status: AC
  Filled 2015-07-09: qty 1

## 2015-07-09 MED ORDER — SODIUM CHLORIDE 0.9 % IV SOLN
Freq: Once | INTRAVENOUS | Status: AC
  Administered 2015-07-09: 11:00:00 via INTRAVENOUS

## 2015-07-09 MED ORDER — LORAZEPAM 0.5 MG PO TABS: 0.5000 mg | ORAL_TABLET | Freq: Four times a day (QID) | ORAL | Status: DC | PRN

## 2015-07-09 MED ORDER — DEXAMETHASONE 4 MG PO TABS: ORAL_TABLET | ORAL | Status: DC

## 2015-07-09 MED ORDER — CALCITONIN (SALMON) 200 UNIT/ML IJ SOLN
800.0000 [IU] | Freq: Once | INTRAMUSCULAR | Status: AC
  Administered 2015-07-09: 800 [IU] via INTRAMUSCULAR
  Filled 2015-07-09: qty 4

## 2015-07-09 MED ORDER — LIDOCAINE-PRILOCAINE 2.5-2.5 % EX CREA: TOPICAL_CREAM | CUTANEOUS | Status: DC

## 2015-07-09 MED ORDER — PROCHLORPERAZINE MALEATE 10 MG PO TABS: 10.0000 mg | ORAL_TABLET | Freq: Four times a day (QID) | ORAL | Status: DC | PRN

## 2015-07-09 MED ORDER — SODIUM CHLORIDE 0.9 % IJ SOLN
10.0000 mL | INTRAMUSCULAR | Status: DC | PRN
  Administered 2015-07-09: 10 mL
  Filled 2015-07-09: qty 10

## 2015-07-09 NOTE — Addendum Note (Signed)
Encounter addended by: Jacqulyn Liner, RN on: 07/09/2015  1:48 PM<BR>     Documentation filed: Notes Section

## 2015-07-09 NOTE — Progress Notes (Signed)
Pt here for patient teaching.  Reviewed areas of pertinence such as fatigue, skin changes and throat changes . Pt able to give teach back of to pat skin and use unscented/gentle soap,avoid applying anything to skin within 4 hours of treatment. Pt demonstrated understanding and verbalizes understanding of information given and will contact nursing with any questions or concerns.

## 2015-07-09 NOTE — Progress Notes (Addendum)
Mark [name] has completed 4 fractions to his c spine.  He reports pain at a 6/10 in his neck.  He is using fentanyl patches (200 mcg) but forgot to put one on this morning.  He is also taking about 1 tablet of morphine per day.  He denies having any trouble swallowing or sore throat.  He had a platelet transfusion on Monday.  He reports that he feels weak today.  His heart rate is elevated at 137.  BP 135/90 mmHg  Pulse 137  Temp(Src) 98.9 F (37.2 C) (Oral)  Ht 5\' 11"  (1.803 m)  Wt 191 lb 4.8 oz (86.773 kg)  BMI 26.69 kg/m2  SpO2 98%   Wt Readings from Last 3 Encounters:  07/09/15 191 lb 4.8 oz (86.773 kg)  07/06/15 193 lb (87.544 kg)  06/25/15 194 lb 0.1 oz (88 kg)

## 2015-07-09 NOTE — Telephone Encounter (Signed)
°  [place]           [phone] Kyprolis APPROVED [id number]   VALID 07/06/2016 - 01/12/2016                 P: AIM @ [phone] #3 F: [phone] fax [place] [employer] Member #: [id number]

## 2015-07-09 NOTE — Progress Notes (Signed)
  Radiation Oncology         [phone] ________________________________  Name: Mark [name] MRN: [id number]  Date: 07/09/2015  DOB: 1971-07-10  Weekly Radiation Therapy Management    ICD-9-CM ICD-10-CM   1. Multiple myeloma in relapse ([place]) 203.02 C90.02     Current Dose: 10 Gy     Planned Dose:  25 Gy  Narrative . . . . . . . . The patient presents for routine under treatment assessment.                                    He has completed 4 fractions to his cervical spine. He reports pain as a 6/10 in his neck. He is using fentanyl patches (200 mcg), but forgot to put one on this morning. He is also taking about 1 tablet of morphine per day. He denies difficulty swallowing or sore throat. He had a platelet transfusion on Monday. He reports feeling weak today. His heart rate is elevated at 137. He reports that he doesn't get much sleep because he wakes up a lot. He reports drinking a lot of fluids at home. He gets a little dizzy when he stands.                                  Set-up films were reviewed.                                 The chart was checked. Physical Findings. . .  height is $RemoveB'5\' 11"'NfqSRirD$  (1.803 m) and weight is 191 lb 4.8 oz (86.773 kg). His oral temperature is 98.9 F (37.2 C). His blood pressure is 135/90 and his pulse is 137. His oxygen saturation is 98%.   Oral cavity is mildly dry with no secondary infection. Lungs are clear to auscultation bilaterally. Heart has regular rhythm with increased rate. Impression . . . . . . . The patient is tolerating radiation well. Plan . . . . . . . . . . . . Continue treatment as planned. He will proceed to med/onc clinic for further evaluation concerning his elevated pulse.  ________________________________   Blair Promise, PhD, MD  This document serves as a record of services personally performed by Gery Pray, MD. It was created on his behalf by Darcus Austin, a trained medical scribe. The creation of this record is based on the  scribe's personal observations and the provider's statements to them. This document has been checked and approved by the attending provider.

## 2015-07-09 NOTE — Patient Instructions (Signed)
Dehydration, Adult Dehydration is a condition in which you do not have enough fluid or water in your body. It happens when you take in less fluid than you lose. Vital organs such as the kidneys, brain, and heart cannot function without a proper amount of fluids. Any loss of fluids from the body can cause dehydration.  Dehydration can range from mild to severe. This condition should be treated right away to help prevent it from becoming severe. CAUSES  This condition may be caused by:  Vomiting.  Diarrhea.  Excessive sweating, such as when exercising in hot or humid weather.  Not drinking enough fluid during strenuous exercise or during an illness.  Excessive urine output.  Fever.  Certain medicines. RISK FACTORS This condition is more likely to develop in:  People who are taking certain medicines that cause the body to lose excess fluid (diuretics).   People who have a chronic illness, such as diabetes, that may increase urination.  Older adults.   People who live at high altitudes.   People who participate in endurance sports.  SYMPTOMS  Mild Dehydration  Thirst.  Dry lips.  Slightly dry mouth.  Dry, warm skin. Moderate Dehydration  Very dry mouth.   Muscle cramps.   Dark urine and decreased urine production.   Decreased tear production.   Headache.   Light-headedness, especially when you stand up from a sitting position.  Severe Dehydration  Changes in skin.   Cold and clammy skin.   Skin does not spring back quickly when lightly pinched and released.   Changes in body fluids.   Extreme thirst.   No tears.   Not able to sweat when body temperature is high, such as in hot weather.   Minimal urine production.   Changes in vital signs.   Rapid, weak pulse (more than 100 beats per minute when you are sitting still).   Rapid breathing.   Low blood pressure.   Other changes.   Sunken eyes.   Cold hands and feet.    Confusion.  Lethargy and difficulty being awakened.  Fainting (syncope).   Short-term weight loss.   Unconsciousness. DIAGNOSIS  This condition may be diagnosed based on your symptoms. You may also have tests to determine how severe your dehydration is. These tests may include:   Urine tests.   Blood tests.  TREATMENT  Treatment for this condition depends on the severity. Mild or moderate dehydration can often be treated at home. Treatment should be started right away. Do not wait until dehydration becomes severe. Severe dehydration needs to be treated at the hospital. Treatment for Mild Dehydration  Drinking plenty of water to replace the fluid you have lost.   Replacing minerals in your blood (electrolytes) that you may have lost.  Treatment for Moderate Dehydration  Consuming oral rehydration solution (ORS). Treatment for Severe Dehydration  Receiving fluid through an IV tube.   Receiving electrolyte solution through a feeding tube that is passed through your nose and into your stomach (nasogastric tube or NG tube).  Correcting any abnormalities in electrolytes. HOME CARE INSTRUCTIONS   Drink enough fluid to keep your urine clear or pale yellow.   Drink water or fluid slowly by taking small sips. You can also try sucking on ice cubes.  Have food or beverages that contain electrolytes. Examples include bananas and sports drinks.  Take over-the-counter and prescription medicines only as told by your health care provider.   Prepare ORS according to the manufacturer's instructions. Take sips  of ORS every 5 minutes until your urine returns to normal.  If you have vomiting or diarrhea, continue to try to drink water, ORS, or both.   If you have diarrhea, avoid:   Beverages that contain caffeine.   Fruit juice.   Milk.   Carbonated soft drinks.  Do not take salt tablets. This can lead to the condition of having too much sodium in your body  (hypernatremia).  SEEK MEDICAL CARE IF:  You cannot eat or drink without vomiting.  You have had moderate diarrhea during a period of more than 24 hours.  You have a fever. SEEK IMMEDIATE MEDICAL CARE IF:   You have extreme thirst.  You have severe diarrhea.  You have not urinated in 6-8 hours, or you have urinated only a small amount of very dark urine.  You have shriveled skin.  You are dizzy, confused, or both.   This information is not intended to replace advice given to you by your health care provider. Make sure you discuss any questions you have with your health care provider.   Document Released: 07/18/2005 Document Revised: 04/08/2015 Document Reviewed: 12/03/2014 Elsevier Interactive Patient Education 2016 [place].   Calcitonin injection What is this medicine? CALCITONIN (kal si TOE nin) is a hormone. It helps control calcium in the body. It is used to treat Paget's disease, osteoporosis, and hypercalcemia. This medicine may be used for other purposes; ask your health care provider or pharmacist if you have questions. What should I tell my health care provider before I take this medicine? They need to know if you have any of these conditions: -bone cancer -low level of blood calcium -an unusual or allergic reaction to calcitonin, fish, other medicines, foods, dyes, or preservatives -pregnant or trying to get pregnant -breast-feeding How should I use this medicine? This medicine is for injection under the skin or into a muscle. You will be taught how to prepare and give this medicine. Use exactly as directed. Take your medicine at regular intervals. Do not take your medicine more often than directed. It is important that you put your used needles and syringes in a special sharps container. Do not put them in a trash can. If you do not have a sharps container, call your pharmacist or healthcare provider to get one. Talk to your pediatrician regarding the use of  this medicine in children. Special care may be needed. Patients over 18 years old may have a stronger reaction and need a smaller dose. Overdosage: If you think you have taken too much of this medicine contact a poison control center or emergency room at once. NOTE: This medicine is only for you. Do not share this medicine with others. What if I miss a dose? If you miss a dose, use it as soon as you can. If it is almost time for your next dose, use only that dose. Do not use double or extra doses. What may interact with this medicine? -lithium This list may not describe all possible interactions. Give your health care provider a list of all the medicines, herbs, non-prescription drugs, or dietary supplements you use. Also tell them if you smoke, drink alcohol, or use illegal drugs. Some items may interact with your medicine. What should I watch for while using this medicine? Visit your doctor or health care professional for regular checks on your progress. You will need regular blood tests while using this medicine. Talk to your doctor about your risk of cancer. You may be  more at risk for certain types of cancers if you take this medicine. You may need to be on a special diet while taking this medicine. Check with your doctor. Ask if you need to take extra calcium or vitamin D while taking this medicine. What side effects may I notice from receiving this medicine? Side effects that you should report to your doctor or health care professional as soon as possible: -allergic reactions like skin rash, itching or hives, swelling of the face, lips, or tongue -breathing problems -chest pain, tightness -dizziness -fever, chills -tingling in the hands, feet Side effects that usually do not require medical attention (report to your doctor or health care professional if they continue or are bothersome): -back or joint pain -flushing -loss of appetite -nausea, vomiting -pain, swelling where  injected -stomach pain -swollen feet -tremors This list may not describe all possible side effects. Call your doctor for medical advice about side effects. You may report side effects to FDA at 1-800-FDA-1088. Where should I keep my medicine? Keep out of the reach of children. Store in a refrigerator between 2 and 8 degrees C (36 and 46 degrees F). Do not freeze. Throw away any unused medicine after the expiration date. NOTE: This sheet is a summary. It may not cover all possible information. If you have questions about this medicine, talk to your doctor, pharmacist, or health care provider.    2016, Elsevier/Gold Standard. (2012-10-11 12:10:57)  Platelet Transfusion  A platelet transfusion is a procedure in which you receive donated platelets through an IV tube. Platelets are tiny pieces of blood cells. When a blood vessel is damaged, platelets collect in the damaged area to help form a blood clot. This begins the healing process. If your platelet count gets too low, your blood may have trouble clotting.  You may need a platelet transfusion if you have a condition that causes a low number of platelets (thrombocytopenia). A platelet transfusion may be used to stop or prevent bleeding.  LET [facility] CARE PROVIDER KNOW ABOUT:   Any allergies you have.   All medicines you are taking, including vitamins, herbs, eye drops, creams, and over-the-counter medicines.   Previous problems you or members of your family have had with the use of anesthetics.   Any blood disorders you have.   Previous surgeries you have had.   Any medical conditions you may have.   Any reactions you have had during a previous transfusion. RISKS AND COMPLICATIONS Generally, this is a safe procedure. However, problems may occur, including:   Fever with or without chills. The fever usually occurs within the first 4 hours of the transfusion and returns to normal within 48 hours.  Allergic reaction. The  reaction is most commonly caused by antibodies your body creates against substances in the transfusion. Signs of an allergic reaction may include itching, hives, difficulty breathing, shock, or low blood pressure.  Sudden (acute) or delayed hemolytic reaction. This rare reaction can occur during the transfusion and up to 28 days after the transfusion. The reaction usually occurs when your body's defense system (immune system) attacks the new platelets. Signs of a hemolytic reaction may include fever, headache, difficulty breathing, low blood pressure, a rapid heartbeat, or pain in your back, abdomen, chest, or IV site.  Transfusion-related acute lung injury (TRALI). TRALI can occur within hours of a transfusion, or several days later. This is a rare reaction that causes lung damage. The cause is not known.  Infection. Signs of this rare  complication may include fever, chills, vomiting, a rapid heartbeat, or low blood pressure. BEFORE THE PROCEDURE   You may have a blood test to determine your blood type. This is necessary to find out what kind ofplatelets best matches your platelets.  If you have had an allergic reaction to a transfusion in the past, you may be given medicine to help prevent a reaction. Take this medicine only as directed by your health care provider.  Your temperature, blood pressure, and pulse will be monitored before the transfusion. PROCEDURE  An IV will be started in your hand or arm.  The transfusion will be attached to your IV tubing. The bag of donated platelets will be attached to your IV tube andgiven into your vein.  Your temperature, blood pressure, and pulse will be monitored regularly during the transfusion. This monitoring is done to help detect early signs of a transfusion reaction.  If you have any signs or symptoms of a reaction, your transfusion will be stopped and you may be given medicine.  When your transfusion is complete, your IV will be  removed.  Pressure may be applied to the IV site for a few minutes.  A bandage (dressing) will be applied. The procedure may vary among health care providers and hospitals. AFTER THE PROCEDURE  Your blood pressure, temperature, and pulse will be monitored regularly.   This information is not intended to replace advice given to you by your health care provider. Make sure you discuss any questions you have with your health care provider.   Document Released: 05/15/2007 Document Revised: 08/08/2014 Document Reviewed: 05/28/2014 Elsevier Interactive Patient Education 2016 [place] injection What is this medicine? CARFILZOMIB (kar FILZ oh mib) targets a specific protein within cancer cells and stops the cancer cells from growing. It is used to treat multiple myeloma. This medicine may be used for other purposes; ask your health care provider or pharmacist if you have questions. What should I tell my health care provider before I take this medicine? They need to know if you have any of these conditions: -heart disease -history of blood clots -irregular heartbeat -kidney disease -liver disease -lung or breathing disease -an unusual or allergic reaction to carfilzomib, or other medicines, foods, dyes, or preservatives -pregnant or trying to get pregnant -breast-feeding How should I use this medicine? This medicine is for injection or infusion into a vein. It is given by a health care professional in a hospital or clinic setting. Talk to your pediatrician regarding the use of this medicine in children. Special care may be needed. Overdosage: If you think you have taken too much of this medicine contact a poison control center or emergency room at once. NOTE: This medicine is only for you. Do not share this medicine with others. What if I miss a dose? It is important not to miss your dose. Call your doctor or health care professional if you are unable to keep an  appointment. What may interact with this medicine? Interactions are not expected. Give your health care provider a list of all the medicines, herbs, non-prescription drugs, or dietary supplements you use. Also tell them if you smoke, drink alcohol, or use illegal drugs. Some items may interact with your medicine. This list may not describe all possible interactions. Give your health care provider a list of all the medicines, herbs, non-prescription drugs, or dietary supplements you use. Also tell them if you smoke, drink alcohol, or use illegal drugs. Some items may interact with  your medicine. What should I watch for while using this medicine? Your condition will be monitored carefully while you are receiving this medicine. Report any side effects. Continue your course of treatment even though you feel ill unless your doctor tells you to stop. You may need blood work done while you are taking this medicine. Do not become pregnant while taking this medicine or for at least 30 days after stopping it. Women should inform their doctor if they wish to become pregnant or think they might be pregnant. There is a potential for serious side effects to an unborn child. Men should not father a child while taking this medicine and for 90 days after stopping it. Talk to your health care professional or pharmacist for more information. Do not breast-feed an infant while taking this medicine. Check with your doctor or health care professional if you get an attack of severe diarrhea, nausea and vomiting, or if you sweat a lot. The loss of too much body fluid can make it dangerous for you to take this medicine. You may get dizzy. Do not drive, use machinery, or do anything that needs mental alertness until you know how this medicine affects you. Do not stand or sit up quickly, especially if you are an older patient. This reduces the risk of dizzy or fainting spells. What side effects may I notice from receiving this  medicine? Side effects that you should report to your doctor or health care professional as soon as possible: -allergic reactions like skin rash, itching or hives, swelling of the face, lips, or tongue -confusion -dizziness -feeling faint or lightheaded -fever or chills -palpitations -seizures -signs and symptoms of bleeding such as bloody or black, tarry stools; red or dark-brown urine; spitting up blood or brown material that looks like coffee grounds; red spots on the skin; unusual bruising or bleeding including from the eye, gums, or nose -signs and symptoms of a blood clot such as breathing problems; changes in vision; chest pain; severe, sudden headache; pain, swelling, warmth in the leg; trouble speaking; sudden numbness or weakness of the face, arm or leg -signs and symptoms of kidney injury like trouble passing urine or change in the amount of urine -signs and symptoms of liver injury like dark yellow or brown urine; general ill feeling or flu-like symptoms; light-colored stools; loss of appetite; nausea; right upper belly pain; unusually weak or tired; yellowing of the eyes or skin Side effects that usually do not require medical attention (report to your doctor or health care professional if they continue or are bothersome): -back pain -cough -diarrhea -headache -muscle cramps -vomiting This list may not describe all possible side effects. Call your doctor for medical advice about side effects. You may report side effects to FDA at 1-800-FDA-1088. Where should I keep my medicine? This drug is given in a hospital or clinic and will not be stored at home. NOTE: This sheet is a summary. It may not cover all possible information. If you have questions about this medicine, talk to your doctor, pharmacist, or health care provider.    2016, Elsevier/Gold Standard. (2015-03-10 16:16:00)

## 2015-07-09 NOTE — Telephone Encounter (Signed)
[employer] has APPROVED the FENTANYL 100MCG/HR TRANSDERMAL PATCH 30 per month CAP 2.5MG   and is good until 01/05/2016.         COPY SCANNED

## 2015-07-09 NOTE — Telephone Encounter (Signed)
Critical Value Calcium 13.5 Dr Marin Olp notified. He will place orders

## 2015-07-09 NOTE — Progress Notes (Signed)
Hematology and Oncology Follow Up Visit  Mark [name] [id number] 27-Oct-1970 44 y.o. 07/09/2015   Principle Diagnosis:  Relapsed kappa light chain myeloma - July 2016 - transplant at [facility] in February 2015  Recurrent hypercalcemia   Current Therapy:   Revlimid/Dexamethasone/Elotuzumab/Carfilzomib q 28 days (started 07/09/15) Zometa q 28 days     Interim History:  Mark [name] is here today for a follow-up and treatment. He is feeling fatigued. He is having neuropathy pain in his feet. His BP is elevated at 154/111 and he is tachycardic at 145.  His calcium today is 13.5 so he will get calcitonin again today.  His Hgb is stable at 10.4 and platelet count is 11. He has had no episodes of bleeding or bruising.  No lymphadenopathy found on exam.  He has had a couple episodes of nausea and vomiting. He states that he still has a good appetite and is eating. He is drinking fluids but still appears to be dehydrated.  No fever, cough, rash, dizziness, SOB, chest pain, palpitations, abdominal pain or changes in bowel or bladder habits.  No episodes of bleeding or bruising. No swelling in his extremities.   Medications:    Medication List       This list is accurate as of: 07/09/15 11:22 AM.  Always use your most recent med list.               bisacodyl 5 MG EC tablet  Commonly known as:  DULCOLAX  Take 5 mg by mouth daily as needed for moderate constipation (constipation).     cyclobenzaprine 10 MG tablet  Commonly known as:  FLEXERIL  Take 1 tablet (10 mg total) by mouth 3 (three) times daily as needed for muscle spasms.     famciclovir 500 MG tablet  Commonly known as:  FAMVIR  Take 1 tablet (500 mg total) by mouth daily.     fentaNYL 100 MCG/HR  Commonly known as:  DURAGESIC - dosed mcg/hr  Place 2 patches (200 mcg total) onto the skin every other day.     gabapentin 300 MG capsule  Commonly known as:  NEURONTIN  Take 2 capsules (600 mg total) by mouth 3  (three) times daily.     lidocaine-prilocaine cream  Commonly known as:  EMLA  Apply to affected area once     LORazepam 0.5 MG tablet  Commonly known as:  ATIVAN  Take 1 tablet (0.5 mg total) by mouth every 6 (six) hours as needed (Nausea or vomiting).     morphine 30 MG tablet  Commonly known as:  MSIR  Take 1/2-1 pill, IF NEEDED, every 4 hrs for pain.     ondansetron 8 MG disintegrating tablet  Commonly known as:  ZOFRAN ODT  Take 1 tablet (8 mg total) by mouth every 8 (eight) hours as needed for nausea or vomiting.     pantoprazole 40 MG tablet  Commonly known as:  PROTONIX  Take 1 tablet (40 mg total) by mouth daily.     prochlorperazine 10 MG tablet  Commonly known as:  COMPAZINE  Take 1 tablet (10 mg total) by mouth every 6 (six) hours as needed (Nausea or vomiting).        Allergies: No Known Allergies  Past Medical History, Surgical history, Social history, and Family History were reviewed and updated.  Review of Systems: All other 10 point review of systems is negative.   Physical Exam:  height is 5\' 11"  (1.803 m) and weight is  190 lb (86.183 kg). His oral temperature is 98.1 F (36.7 C). His blood pressure is 154/111 and his pulse is 145. His respiration is 16.   Wt Readings from Last 3 Encounters:  07/09/15 190 lb (86.183 kg)  07/09/15 191 lb 4.8 oz (86.773 kg)  07/06/15 193 lb (87.544 kg)    Ocular: Sclerae unicteric, pupils equal, round and reactive to light Ear-nose-throat: Oropharynx clear, dentition fair Lymphatic: No cervical supraclavicular or axillary adenopathy Lungs no rales or rhonchi, good excursion bilaterally Heart regular rate and rhythm, no murmur appreciated Abd soft, nontender, positive bowel sounds MSK no focal spinal tenderness, no joint edema Neuro: non-focal, well-oriented, appropriate affect Breasts: Deferred  Lab Results  Component Value Date   WBC 3.9* 07/09/2015   HGB 10.4* 07/09/2015   HCT 32.1* 07/09/2015   MCV 85  07/09/2015   PLT 11* 07/09/2015   No results found for: FERRITIN, IRON, TIBC, UIBC, IRONPCTSAT Lab Results  Component Value Date   RBC 3.77* 07/09/2015   Lab Results  Component Value Date   KPAFRELGTCHN 1022.90* 07/02/2015   LAMBDASER 1.96* 07/02/2015   KAPLAMBRATIO 521.89* 07/02/2015   Lab Results  Component Value Date   IGGSERUM 351* 05/19/2015   IGA 7* 05/19/2015   IGMSERUM <5* 05/19/2015   Lab Results  Component Value Date   TOTALPROTELP 6.3 05/19/2015   ALBUMINELP 3.8 05/19/2015   A1GS 0.5* 05/19/2015   A2GS 1.0* 05/19/2015   BETS 0.3* 05/19/2015   BETA2SER 0.4 05/19/2015   GAMS 0.4* 05/19/2015   MSPIKE 71.7* 06/10/2015   SPEI * 05/19/2015     Chemistry      Component Value Date/Time   NA 136 07/07/2015 1026   NA 141 07/04/2015 0515   NA 133* 06/24/2015 1518   K 3.4 07/07/2015 1026   K 3.8 07/04/2015 0515   K 3.6 06/24/2015 1518   CL 99 07/07/2015 1026   CL 105 07/04/2015 0515   CO2 27 07/07/2015 1026   CO2 30 07/04/2015 0515   CO2 28 06/24/2015 1518   BUN 9 07/07/2015 1026   BUN 10 07/04/2015 0515   BUN 21.3 06/24/2015 1518   CREATININE 0.8 07/07/2015 1026   CREATININE 0.91 07/04/2015 0515   CREATININE 1.1 06/24/2015 1518      Component Value Date/Time   CALCIUM 12.5* 07/07/2015 1026   CALCIUM 10.8* 07/04/2015 0515   CALCIUM 15.5* 06/24/2015 1518   CALCIUM >15.0* 02/01/2013 1230   ALKPHOS 52 07/07/2015 1026   ALKPHOS 52 07/04/2015 0515   ALKPHOS 101 06/24/2015 1518   AST 34 07/07/2015 1026   AST 31 07/04/2015 0515   AST 56* 06/24/2015 1518   ALT 28 07/07/2015 1026   ALT 23 07/04/2015 0515   ALT 46 06/24/2015 1518   BILITOT 0.70 07/07/2015 1026   BILITOT 1.1 07/04/2015 0515   BILITOT 1.94* 06/24/2015 1518     Impression and Plan: Mark [name] is 44 year old African American male with relapsed kappa light chain myeloma and recurrent hypercalcemia. 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 His allogenic transplant has been put on hold due to his relapse. He is feeling fatigued and is hypertensive and tachycardic.  He is getting IV fluids at this time.  His Calcium today is 13.5. We will give him Calcitonin 800 units IM today.  We will proceed with cycle 1 of Revlimid/Dexamethasone/Elotuzumab/Carfilzomib.  He was given Morphine 6 mg IV for pain.  His BP  is now 127/68 with a HR of 122 sinus tach after fluids and pain medication.  We will continue to follow along with him closely.  He has his current appointment schedule.  He will contact us with any questions or concerns. We can always see him sooner if need be.   [name], NP 12/8/201611:22 AM

## 2015-07-10 ENCOUNTER — Ambulatory Visit
Admit: 2015-07-10 | Discharge: 2015-07-10 | Disposition: A | Discharge status: Home / Self Care | Payer: BLUE CROSS/BLUE SHIELD | Attending: Radiation Oncology | Admitting: Radiation Oncology

## 2015-07-10 ENCOUNTER — Other Ambulatory Visit (HOSPITAL_BASED_OUTPATIENT_CLINIC_OR_DEPARTMENT_OTHER): Payer: BLUE CROSS/BLUE SHIELD

## 2015-07-10 ENCOUNTER — Telehealth: Payer: Self-pay | Admitting: *Deleted

## 2015-07-10 ENCOUNTER — Other Ambulatory Visit: Payer: Self-pay | Admitting: *Deleted

## 2015-07-10 ENCOUNTER — Other Ambulatory Visit: Payer: Self-pay | Admitting: Hematology & Oncology

## 2015-07-10 ENCOUNTER — Ambulatory Visit (HOSPITAL_BASED_OUTPATIENT_CLINIC_OR_DEPARTMENT_OTHER): Payer: BLUE CROSS/BLUE SHIELD

## 2015-07-10 ENCOUNTER — Inpatient Hospital Stay: Payer: BLUE CROSS/BLUE SHIELD

## 2015-07-10 ENCOUNTER — Encounter: Payer: Self-pay | Admitting: Radiation Oncology

## 2015-07-10 VITALS — BP 104/59 | HR 124 | Temp 98.2°F | Resp 16

## 2015-07-10 DIAGNOSIS — C9002 Multiple myeloma in relapse: Secondary | ICD-10-CM

## 2015-07-10 DIAGNOSIS — C9000 Multiple myeloma not having achieved remission: Secondary | ICD-10-CM

## 2015-07-10 DIAGNOSIS — Z5112 Encounter for antineoplastic immunotherapy: Secondary | ICD-10-CM | POA: Diagnosis not present

## 2015-07-10 DIAGNOSIS — Z510 Encounter for antineoplastic radiation therapy: Secondary | ICD-10-CM | POA: Diagnosis not present

## 2015-07-10 LAB — CBC WITH DIFFERENTIAL (CANCER CENTER ONLY)
BASO#: 0 10*3/uL (ref 0.0–0.2)
BASO%: 0.3 % (ref 0.0–2.0)
EOS%: 0 % (ref 0.0–7.0)
Eosinophils Absolute: 0 10*3/uL (ref 0.0–0.5)
HEMATOCRIT: 28.5 % — AB (ref 38.7–49.9)
HGB: 9.5 g/dL — ABNORMAL LOW (ref 13.0–17.1)
LYMPH#: 0.9 10*3/uL (ref 0.9–3.3)
LYMPH%: 22.1 % (ref 14.0–48.0)
MCH: 28.4 pg (ref 28.0–33.4)
MCHC: 33.3 g/dL (ref 32.0–35.9)
MCV: 85 fL (ref 82–98)
MONO#: 0.7 10*3/uL (ref 0.1–0.9)
MONO%: 18.3 % — ABNORMAL HIGH (ref 0.0–13.0)
NEUT#: 2.4 10*3/uL (ref 1.5–6.5)
NEUT%: 59.3 % (ref 40.0–80.0)
Platelets: 7 10*3/uL — CL (ref 145–400)
RBC: 3.34 10*6/uL — ABNORMAL LOW (ref 4.20–5.70)
RDW: 17 % — AB (ref 11.1–15.7)
WBC: 4 10*3/uL (ref 4.0–10.0)

## 2015-07-10 LAB — CMP (CANCER CENTER ONLY)
ALT(SGPT): 25 U/L (ref 10–47)
AST: 33 U/L (ref 11–38)
Albumin: 3 g/dL — ABNORMAL LOW (ref 3.3–5.5)
Alkaline Phosphatase: 60 U/L (ref 26–84)
BUN, Bld: 20 mg/dL (ref 7–22)
CO2: 25 meq/L (ref 18–33)
Chloride: 99 mEq/L (ref 98–108)
Creat: 1.6 mg/dl — ABNORMAL HIGH (ref 0.6–1.2)
GLUCOSE: 155 mg/dL — AB (ref 73–118)
POTASSIUM: 3.8 meq/L (ref 3.3–4.7)
Sodium: 136 mEq/L (ref 128–145)
Total Bilirubin: 1 mg/dl (ref 0.20–1.60)
Total Protein: 6 g/dL — ABNORMAL LOW (ref 6.4–8.1)

## 2015-07-10 LAB — HOLD TUBE, BLOOD BANK - CHCC SATELLITE

## 2015-07-10 MED ORDER — SODIUM CHLORIDE 0.9 % IV SOLN
Freq: Once | INTRAVENOUS | Status: AC
  Administered 2015-07-10: 15:00:00 via INTRAVENOUS

## 2015-07-10 MED ORDER — SODIUM CHLORIDE 0.9 % IJ SOLN
10.0000 mL | INTRAMUSCULAR | Status: DC | PRN
  Administered 2015-07-10: 10 mL
  Filled 2015-07-10: qty 10

## 2015-07-10 MED ORDER — DEXTROSE 5 % IV SOLN
27.0000 mg/m2 | Freq: Once | INTRAVENOUS | Status: AC
  Administered 2015-07-10: 56 mg via INTRAVENOUS
  Filled 2015-07-10: qty 28

## 2015-07-10 MED ORDER — ZOLEDRONIC ACID 4 MG/5ML IV CONC: 4.0000 mg | Freq: Once | INTRAVENOUS | Status: DC

## 2015-07-10 MED ORDER — CALCITONIN (SALMON) 200 UNIT/ML IJ SOLN
800.0000 [IU] | Freq: Once | INTRAMUSCULAR | Status: AC
  Administered 2015-07-10: 800 [IU] via INTRAMUSCULAR
  Filled 2015-07-10: qty 4

## 2015-07-10 MED ORDER — HEPARIN SOD (PORK) LOCK FLUSH 100 UNIT/ML IV SOLN
500.0000 [IU] | Freq: Once | INTRAVENOUS | Status: AC | PRN
  Administered 2015-07-10: 500 [IU]
  Filled 2015-07-10: qty 5

## 2015-07-10 MED ORDER — SODIUM CHLORIDE 0.9 % IV SOLN
Freq: Once | INTRAVENOUS | Status: AC
  Administered 2015-07-10: 15:00:00 via INTRAVENOUS
  Filled 2015-07-10: qty 4

## 2015-07-10 NOTE — Patient Instructions (Signed)
Hypercalcemia Hypercalcemia is having too much calcium in the blood. The body needs calcium to make bones and keep them strong. Calcium also helps the muscles, nerves, brain, and heart work the way they should. Most of the calcium in the body is in the bones. There is also some calcium in the blood. Hypercalcemia can happen when calcium comes out of the bones, or when the kidneys are not able to remove calcium from the blood. Hypercalcemia can be mild or severe. CAUSES There are many possible causes of hypercalcemia. Common causes include:  Hyperparathyroidism. This is a condition in which the body produces too much parathyroid hormone. There are four parathyroid glands in your neck. These glands produce a chemical messenger (hormone) that helps the body absorb calcium from foods and helps your bones release calcium.  Certain kinds of cancer, such as lung cancer, breast cancer, or myeloma. Less common causes of hypercalcemia include:   Getting too much calcium or vitamin D from your diet.  Kidney failure.  Hyperthyroidism.  Being on bed rest for a long time.  Certain medicines.  Infections.  Sarcoidosis. RISK FACTORS This condition is more likely to develop in:  Women.  People who are 60 years or older.  People who have a family history of hypercalcemia. SYMPTOMS  Mild hypercalcemia that starts slowly may not cause symptoms. Severe, sudden hypercalcemia is more likely to cause symptoms, such as:  Loss of appetite.  Increased thirst and frequent urination.  Fatigue.  Nausea and vomiting.  Headache.  Abdominal pain.  Muscle pain, twitching, or weakness.  Constipation.  Blood in the urine.  Pain in the side of the back (flank pain).  Anxiety, confusion, or depression.  Irregular heartbeat (arrhythmia).  Loss of consciousness. DIAGNOSIS  This condition may be diagnosed based on:   Your symptoms.  Blood tests.  Urine  tests.  X-rays.  Ultrasound.  MRI.  CT scan. TREATMENT  Treatment for hypercalcemia depends on the cause. Treatment may include:  Receiving fluids through an IV tube.  Medicines that keep calcium levels steady after receiving fluids (loop diuretics).  Medicines that keep calcium in your bones (bisphosphonates).  Medicines that lower the calcium level in your blood.  Surgery to remove overactive parathyroid glands. HOME CARE INSTRUCTIONS  Take over-the-counter and prescription medicines only as told by your health care provider.  Follow instructions from your health care provider about eating or drinking restrictions.  Drink enough fluid to keep your urine clear or pale yellow.  Stay active. Weight-bearing exercise helps to keep calcium in your bones. Follow instructions from your health care provider about what type and level of exercise is safe for you.  Keep all follow-up visits as told by your health care provider. This is important. SEEK MEDICAL CARE IF:  You have a fever.  You have flank or abdominal pain that is getting worse. SEEK IMMEDIATE MEDICAL CARE IF:   You have severe abdominal or flank pain.  You have chest pain.  You have trouble breathing.  You become very confused and sleepy.  You lose consciousness.   This information is not intended to replace advice given to you by your health care provider. Make sure you discuss any questions you have with your health care provider.   Document Released: 10/01/2004 Document Revised: 04/08/2015 Document Reviewed: 12/03/2014 Elsevier Interactive Patient Education 2016 [employer]  

## 2015-07-10 NOTE — Telephone Encounter (Signed)
Critical Value Platelets 7  Calcium 13.5 Dr Marin Olp notified. He will place orders

## 2015-07-13 ENCOUNTER — Other Ambulatory Visit (HOSPITAL_BASED_OUTPATIENT_CLINIC_OR_DEPARTMENT_OTHER): Payer: BLUE CROSS/BLUE SHIELD

## 2015-07-13 ENCOUNTER — Other Ambulatory Visit: Payer: Self-pay | Admitting: *Deleted

## 2015-07-13 ENCOUNTER — Ambulatory Visit: Payer: BLUE CROSS/BLUE SHIELD

## 2015-07-13 ENCOUNTER — Other Ambulatory Visit: Payer: Self-pay | Admitting: Family

## 2015-07-13 ENCOUNTER — Ambulatory Visit (HOSPITAL_BASED_OUTPATIENT_CLINIC_OR_DEPARTMENT_OTHER): Payer: BLUE CROSS/BLUE SHIELD

## 2015-07-13 ENCOUNTER — Other Ambulatory Visit: Payer: Self-pay | Admitting: Hematology & Oncology

## 2015-07-13 ENCOUNTER — Telehealth: Payer: Self-pay | Admitting: *Deleted

## 2015-07-13 VITALS — BP 114/69 | HR 129 | Temp 99.8°F | Resp 20

## 2015-07-13 DIAGNOSIS — D501 Sideropenic dysphagia: Secondary | ICD-10-CM

## 2015-07-13 DIAGNOSIS — K591 Functional diarrhea: Secondary | ICD-10-CM

## 2015-07-13 DIAGNOSIS — C9002 Multiple myeloma in relapse: Secondary | ICD-10-CM

## 2015-07-13 DIAGNOSIS — C9000 Multiple myeloma not having achieved remission: Secondary | ICD-10-CM

## 2015-07-13 DIAGNOSIS — D696 Thrombocytopenia, unspecified: Secondary | ICD-10-CM

## 2015-07-13 LAB — CBC WITH DIFFERENTIAL (CANCER CENTER ONLY)
BASO#: 0 10*3/uL (ref 0.0–0.2)
BASO%: 0.3 % (ref 0.0–2.0)
EOS%: 0 % (ref 0.0–7.0)
Eosinophils Absolute: 0 10*3/uL (ref 0.0–0.5)
HCT: 30.8 % — ABNORMAL LOW (ref 38.7–49.9)
HGB: 10.1 g/dL — ABNORMAL LOW (ref 13.0–17.1)
LYMPH#: 1.3 10*3/uL (ref 0.9–3.3)
LYMPH%: 22 % (ref 14.0–48.0)
MCH: 27.7 pg — ABNORMAL LOW (ref 28.0–33.4)
MCHC: 32.8 g/dL (ref 32.0–35.9)
MCV: 84 fL (ref 82–98)
MONO#: 0.6 10*3/uL (ref 0.1–0.9)
MONO%: 10 % (ref 0.0–13.0)
NEUT#: 4 10*3/uL (ref 1.5–6.5)
NEUT%: 67.7 % (ref 40.0–80.0)
RBC: 3.65 10*6/uL — AB (ref 4.20–5.70)
RDW: 16.6 % — AB (ref 11.1–15.7)
WBC: 5.9 10*3/uL (ref 4.0–10.0)

## 2015-07-13 LAB — CMP (CANCER CENTER ONLY)
ALT: 23 U/L (ref 10–47)
AST: 33 U/L (ref 11–38)
Albumin: 2.9 g/dL — ABNORMAL LOW (ref 3.3–5.5)
Alkaline Phosphatase: 74 U/L (ref 26–84)
BILIRUBIN TOTAL: 1.3 mg/dL (ref 0.20–1.60)
BUN: 23 mg/dL — AB (ref 7–22)
CALCIUM: 15.7 mg/dL — AB (ref 8.0–10.3)
CO2: 26 meq/L (ref 18–33)
CREATININE: 1.2 mg/dL (ref 0.6–1.2)
Chloride: 96 mEq/L — ABNORMAL LOW (ref 98–108)
GLUCOSE: 231 mg/dL — AB (ref 73–118)
Potassium: 4.1 mEq/L (ref 3.3–4.7)
SODIUM: 136 meq/L (ref 128–145)
Total Protein: 6.6 g/dL (ref 6.4–8.1)

## 2015-07-13 LAB — TECHNOLOGIST REVIEW CHCC SATELLITE

## 2015-07-13 LAB — HOLD TUBE, BLOOD BANK - CHCC SATELLITE

## 2015-07-13 MED ORDER — FUROSEMIDE 10 MG/ML IJ SOLN
INTRAMUSCULAR | Status: AC
  Filled 2015-07-13: qty 4

## 2015-07-13 MED ORDER — HEPARIN SOD (PORK) LOCK FLUSH 100 UNIT/ML IV SOLN
500.0000 [IU] | Freq: Every day | INTRAVENOUS | Status: AC | PRN
  Administered 2015-07-13: 500 [IU]
  Filled 2015-07-13: qty 5

## 2015-07-13 MED ORDER — DIPHENHYDRAMINE HCL 25 MG PO CAPS
ORAL_CAPSULE | ORAL | Status: AC
  Filled 2015-07-13: qty 1

## 2015-07-13 MED ORDER — LOPERAMIDE HCL 2 MG PO CAPS
ORAL_CAPSULE | ORAL | Status: AC
  Filled 2015-07-13: qty 1

## 2015-07-13 MED ORDER — SODIUM CHLORIDE 0.9 % IV SOLN
250.0000 mL | Freq: Once | INTRAVENOUS | Status: AC
  Administered 2015-07-13: 250 mL via INTRAVENOUS

## 2015-07-13 MED ORDER — CALCITONIN (SALMON) 200 UNIT/ML IJ SOLN
800.0000 [IU] | Freq: Every day | INTRAMUSCULAR | Status: DC
  Administered 2015-07-13: 800 [IU] via INTRAMUSCULAR
  Filled 2015-07-13: qty 4

## 2015-07-13 MED ORDER — SODIUM CHLORIDE 0.9 % IJ SOLN
3.0000 mL | INTRAMUSCULAR | Status: AC | PRN
  Administered 2015-07-13: 3 mL
  Filled 2015-07-13: qty 10

## 2015-07-13 MED ORDER — DIPHENHYDRAMINE HCL 25 MG PO CAPS
25.0000 mg | ORAL_CAPSULE | Freq: Once | ORAL | Status: AC
  Administered 2015-07-13: 25 mg via ORAL

## 2015-07-13 MED ORDER — HEPARIN SOD (PORK) LOCK FLUSH 100 UNIT/ML IV SOLN
500.0000 [IU] | Freq: Once | INTRAVENOUS | Status: AC
  Administered 2015-07-13: 500 [IU] via INTRAVENOUS
  Filled 2015-07-13: qty 5

## 2015-07-13 MED ORDER — LOPERAMIDE HCL 2 MG PO CAPS
4.0000 mg | ORAL_CAPSULE | Freq: Once | ORAL | Status: AC
  Administered 2015-07-13: 4 mg via ORAL

## 2015-07-13 MED ORDER — ACETAMINOPHEN 325 MG PO TABS
ORAL_TABLET | ORAL | Status: AC
  Filled 2015-07-13: qty 2

## 2015-07-13 MED ORDER — FUROSEMIDE 10 MG/ML IJ SOLN
40.0000 mg | Freq: Once | INTRAMUSCULAR | Status: AC
  Administered 2015-07-13: 40 mg via INTRAMUSCULAR

## 2015-07-13 MED ORDER — ACETAMINOPHEN 325 MG PO TABS
650.0000 mg | ORAL_TABLET | Freq: Once | ORAL | Status: AC
  Administered 2015-07-13: 650 mg via ORAL

## 2015-07-13 MED ORDER — SODIUM CHLORIDE 0.9 % IV SOLN
INTRAVENOUS | Status: AC
  Administered 2015-07-13: 14:00:00 via INTRAVENOUS

## 2015-07-13 MED ORDER — SODIUM CHLORIDE 0.9 % IJ SOLN
10.0000 mL | INTRAMUSCULAR | Status: DC | PRN
  Administered 2015-07-13: 10 mL via INTRAVENOUS
  Filled 2015-07-13: qty 10

## 2015-07-13 MED ORDER — ZOLEDRONIC ACID 4 MG/100ML IV SOLN
4.0000 mg | Freq: Once | INTRAVENOUS | Status: AC
  Administered 2015-07-13: 4 mg via INTRAVENOUS
  Filled 2015-07-13: qty 100

## 2015-07-13 MED ORDER — FUROSEMIDE 10 MG/ML IJ SOLN: 40.0000 mg | Freq: Once | INTRAMUSCULAR | Status: DC

## 2015-07-13 NOTE — Patient Instructions (Addendum)
Platelet Transfusion, Care After Refer to this sheet in the next few weeks. These instructions provide you with information about caring for yourself after your procedure. Your health care provider may also give you more specific instructions. Your treatment has been planned according to current medical practices, but problems sometimes occur. Call your health care provider if you have any problems or questions after your procedure.  WHAT TO EXPECT AFTER THE PROCEDURE After your procedure, it is common to have:   Bruising and soreness at the IV site.   Fever or chills within the first 48 hours of your transfusion. HOME CARE INSTRUCTIONS  Take medicines only as directed by your health care provider. Ask your health care provider if you can take an over-the-counter pain reliever in case you have a fever or headache a day or two after your transfusion.   Return to your normal activities as directed by your health care provider.  SEEK MEDICAL CARE IF:  You have a fever.  You have a headache.  You have redness, swelling, or pain at your IV site.  You have skin itching or a rash.  You vomit.  You feel unusually tired or weak. SEEK IMMEDIATE MEDICAL CARE IF:   You have trouble breathing.  You have a decreased amount of urine or you urinate less often than you normally do.  Your urine is darker than normal.  You have pain in your back, abdomen, or chest.  You have cool, clammy skin.  You have a rapid heartbeat.   This information is not intended to replace advice given to you by your health care provider. Make sure you discuss any questions you have with your health care provider.   Document Released: 08/08/2014 Document Reviewed: 08/08/2014 Elsevier Interactive Patient Education 2016 [employer]. Calcitonin injection What is this medicine? CALCITONIN (kal si TOE nin) is a hormone. It helps control calcium in the body. It is used to treat Paget's disease, osteoporosis, and  hypercalcemia. This medicine may be used for other purposes; ask your health care provider or pharmacist if you have questions. What should I tell my health care provider before I take this medicine? They need to know if you have any of these conditions: -bone cancer -low level of blood calcium -an unusual or allergic reaction to calcitonin, fish, other medicines, foods, dyes, or preservatives -pregnant or trying to get pregnant -breast-feeding How should I use this medicine? This medicine is for injection under the skin or into a muscle. You will be taught how to prepare and give this medicine. Use exactly as directed. Take your medicine at regular intervals. Do not take your medicine more often than directed. It is important that you put your used needles and syringes in a special sharps container. Do not put them in a trash can. If you do not have a sharps container, call your pharmacist or healthcare provider to get one. Talk to your pediatrician regarding the use of this medicine in children. Special care may be needed. Patients over 40 years old may have a stronger reaction and need a smaller dose. Overdosage: If you think you have taken too much of this medicine contact a poison control center or emergency room at once. NOTE: This medicine is only for you. Do not share this medicine with others. What if I miss a dose? If you miss a dose, use it as soon as you can. If it is almost time for your next dose, use only that dose. Do not use  double or extra doses. What may interact with this medicine? -lithium This list may not describe all possible interactions. Give your health care provider a list of all the medicines, herbs, non-prescription drugs, or dietary supplements you use. Also tell them if you smoke, drink alcohol, or use illegal drugs. Some items may interact with your medicine. What should I watch for while using this medicine? Visit your doctor or health care professional for  regular checks on your progress. You will need regular blood tests while using this medicine. Talk to your doctor about your risk of cancer. You may be more at risk for certain types of cancers if you take this medicine. You may need to be on a special diet while taking this medicine. Check with your doctor. Ask if you need to take extra calcium or vitamin D while taking this medicine. What side effects may I notice from receiving this medicine? Side effects that you should report to your doctor or health care professional as soon as possible: -allergic reactions like skin rash, itching or hives, swelling of the face, lips, or tongue -breathing problems -chest pain, tightness -dizziness -fever, chills -tingling in the hands, feet Side effects that usually do not require medical attention (report to your doctor or health care professional if they continue or are bothersome): -back or joint pain -flushing -loss of appetite -nausea, vomiting -pain, swelling where injected -stomach pain -swollen feet -tremors This list may not describe all possible side effects. Call your doctor for medical advice about side effects. You may report side effects to FDA at 1-800-FDA-1088. Where should I keep my medicine? Keep out of the reach of children. Store in a refrigerator between 2 and 8 degrees C (36 and 46 degrees F). Do not freeze. Throw away any unused medicine after the expiration date. NOTE: This sheet is a summary. It may not cover all possible information. If you have questions about this medicine, talk to your doctor, pharmacist, or health care provider.    2016, Elsevier/[employer] Standard. (2012-10-11 12:10:57) Zoledronic Acid injection (Hypercalcemia, Oncology) What is this medicine? ZOLEDRONIC ACID (ZOE le dron ik AS id) lowers the amount of calcium loss from bone. It is used to treat too much calcium in your blood from cancer. It is also used to prevent complications of cancer that has spread  to the bone. This medicine may be used for other purposes; ask your health care provider or pharmacist if you have questions. What should I tell my health care provider before I take this medicine? They need to know if you have any of these conditions: -aspirin-sensitive asthma -cancer, especially if you are receiving medicines used to treat cancer -dental disease or wear dentures -infection -kidney disease -receiving corticosteroids like dexamethasone or prednisone -an unusual or allergic reaction to zoledronic acid, other medicines, foods, dyes, or preservatives -pregnant or trying to get pregnant -breast-feeding How should I use this medicine? This medicine is for infusion into a vein. It is given by a health care professional in a hospital or clinic setting. Talk to your pediatrician regarding the use of this medicine in children. Special care may be needed. Overdosage: If you think you have taken too much of this medicine contact a poison control center or emergency room at once. NOTE: This medicine is only for you. Do not share this medicine with others. What if I miss a dose? It is important not to miss your dose. Call your doctor or health care professional if you are unable to  keep an appointment. What may interact with this medicine? -certain antibiotics given by injection -NSAIDs, medicines for pain and inflammation, like ibuprofen or naproxen -some diuretics like bumetanide, furosemide -teriparatide -thalidomide This list may not describe all possible interactions. Give your health care provider a list of all the medicines, herbs, non-prescription drugs, or dietary supplements you use. Also tell them if you smoke, drink alcohol, or use illegal drugs. Some items may interact with your medicine. What should I watch for while using this medicine? Visit your doctor or health care professional for regular checkups. It may be some time before you see the benefit from this medicine.  Do not stop taking your medicine unless your doctor tells you to. Your doctor may order blood tests or other tests to see how you are doing. Women should inform their doctor if they wish to become pregnant or think they might be pregnant. There is a potential for serious side effects to an unborn child. Talk to your health care professional or pharmacist for more information. You should make sure that you get enough calcium and vitamin D while you are taking this medicine. Discuss the foods you eat and the vitamins you take with your health care professional. Some people who take this medicine have severe bone, joint, and/or muscle pain. This medicine may also increase your risk for jaw problems or a broken thigh bone. Tell your doctor right away if you have severe pain in your jaw, bones, joints, or muscles. Tell your doctor if you have any pain that does not go away or that gets worse. Tell your dentist and dental surgeon that you are taking this medicine. You should not have major dental surgery while on this medicine. See your dentist to have a dental exam and fix any dental problems before starting this medicine. Take good care of your teeth while on this medicine. Make sure you see your dentist for regular follow-up appointments. What side effects may I notice from receiving this medicine? Side effects that you should report to your doctor or health care professional as soon as possible: -allergic reactions like skin rash, itching or hives, swelling of the face, lips, or tongue -anxiety, confusion, or depression -breathing problems -changes in vision -eye pain -feeling faint or lightheaded, falls -jaw pain, especially after dental work -mouth sores -muscle cramps, stiffness, or weakness -redness, blistering, peeling or loosening of the skin, including inside the mouth -trouble passing urine or change in the amount of urine Side effects that usually do not require medical attention (report to  your doctor or health care professional if they continue or are bothersome): -bone, joint, or muscle pain -constipation -diarrhea -fever -hair loss -irritation at site where injected -loss of appetite -nausea, vomiting -stomach upset -trouble sleeping -trouble swallowing -weak or tired This list may not describe all possible side effects. Call your doctor for medical advice about side effects. You may report side effects to FDA at 1-800-FDA-1088. Where should I keep my medicine? This drug is given in a hospital or clinic and will not be stored at home. NOTE: This sheet is a summary. It may not cover all possible information. If you have questions about this medicine, talk to your doctor, pharmacist, or health care provider.    2016, Elsevier/[employer] Standard. (2013-12-14 14:19:39)

## 2015-07-13 NOTE — Progress Notes (Signed)
Pt states diarrhea started last night, Sarah C.  notified, orders received.

## 2015-07-13 NOTE — Telephone Encounter (Signed)
Critical Value PLT <6   Calcium 15.7 Dr Marin Olp notified. Patient here to receive platelets and orders will be placed.

## 2015-07-14 ENCOUNTER — Encounter: Payer: Self-pay | Admitting: Hematology & Oncology

## 2015-07-14 ENCOUNTER — Other Ambulatory Visit: Payer: Self-pay

## 2015-07-14 ENCOUNTER — Ambulatory Visit: Admission: RE | Admit: 2015-07-14 | Payer: BLUE CROSS/BLUE SHIELD | Source: Ambulatory Visit

## 2015-07-14 ENCOUNTER — Emergency Department (HOSPITAL_COMMUNITY): Payer: BLUE CROSS/BLUE SHIELD

## 2015-07-14 ENCOUNTER — Other Ambulatory Visit (HOSPITAL_BASED_OUTPATIENT_CLINIC_OR_DEPARTMENT_OTHER): Payer: BLUE CROSS/BLUE SHIELD

## 2015-07-14 ENCOUNTER — Ambulatory Visit: Payer: BLUE CROSS/BLUE SHIELD

## 2015-07-14 ENCOUNTER — Ambulatory Visit (HOSPITAL_BASED_OUTPATIENT_CLINIC_OR_DEPARTMENT_OTHER): Payer: BLUE CROSS/BLUE SHIELD

## 2015-07-14 ENCOUNTER — Inpatient Hospital Stay (HOSPITAL_COMMUNITY)
Admission: EM | Admit: 2015-07-14 | Discharge: 2015-07-17 | DRG: 640 | Disposition: A | Discharge status: Hospice | Payer: BLUE CROSS/BLUE SHIELD | Attending: Internal Medicine | Admitting: Internal Medicine

## 2015-07-14 ENCOUNTER — Encounter (HOSPITAL_COMMUNITY): Payer: Self-pay | Admitting: Emergency Medicine

## 2015-07-14 ENCOUNTER — Ambulatory Visit
Admission: RE | Admit: 2015-07-14 | Discharge: 2015-07-14 | Disposition: A | Discharge status: Home / Self Care | Payer: BLUE CROSS/BLUE SHIELD | Source: Ambulatory Visit | Attending: Radiation Oncology | Admitting: Radiation Oncology

## 2015-07-14 DIAGNOSIS — N179 Acute kidney failure, unspecified: Secondary | ICD-10-CM | POA: Diagnosis present

## 2015-07-14 DIAGNOSIS — C9002 Multiple myeloma in relapse: Secondary | ICD-10-CM

## 2015-07-14 DIAGNOSIS — Z87891 Personal history of nicotine dependence: Secondary | ICD-10-CM

## 2015-07-14 DIAGNOSIS — I272 Other secondary pulmonary hypertension: Secondary | ICD-10-CM | POA: Diagnosis present

## 2015-07-14 DIAGNOSIS — Z79899 Other long term (current) drug therapy: Secondary | ICD-10-CM | POA: Diagnosis not present

## 2015-07-14 DIAGNOSIS — D61810 Antineoplastic chemotherapy induced pancytopenia: Secondary | ICD-10-CM | POA: Diagnosis present

## 2015-07-14 DIAGNOSIS — N189 Chronic kidney disease, unspecified: Secondary | ICD-10-CM | POA: Diagnosis present

## 2015-07-14 DIAGNOSIS — M898X9 Other specified disorders of bone, unspecified site: Secondary | ICD-10-CM | POA: Diagnosis present

## 2015-07-14 DIAGNOSIS — K219 Gastro-esophageal reflux disease without esophagitis: Secondary | ICD-10-CM | POA: Diagnosis present

## 2015-07-14 DIAGNOSIS — T451X5A Adverse effect of antineoplastic and immunosuppressive drugs, initial encounter: Secondary | ICD-10-CM | POA: Diagnosis present

## 2015-07-14 DIAGNOSIS — Z981 Arthrodesis status: Secondary | ICD-10-CM | POA: Diagnosis not present

## 2015-07-14 DIAGNOSIS — R110 Nausea: Secondary | ICD-10-CM | POA: Diagnosis present

## 2015-07-14 DIAGNOSIS — R000 Tachycardia, unspecified: Secondary | ICD-10-CM | POA: Diagnosis not present

## 2015-07-14 DIAGNOSIS — R918 Other nonspecific abnormal finding of lung field: Secondary | ICD-10-CM | POA: Diagnosis present

## 2015-07-14 DIAGNOSIS — Z923 Personal history of irradiation: Secondary | ICD-10-CM | POA: Diagnosis not present

## 2015-07-14 DIAGNOSIS — E870 Hyperosmolality and hypernatremia: Secondary | ICD-10-CM | POA: Diagnosis present

## 2015-07-14 DIAGNOSIS — Z66 Do not resuscitate: Secondary | ICD-10-CM | POA: Diagnosis present

## 2015-07-14 DIAGNOSIS — M899 Disorder of bone, unspecified: Secondary | ICD-10-CM | POA: Diagnosis not present

## 2015-07-14 DIAGNOSIS — R441 Visual hallucinations: Secondary | ICD-10-CM | POA: Diagnosis not present

## 2015-07-14 DIAGNOSIS — G9340 Encephalopathy, unspecified: Secondary | ICD-10-CM | POA: Diagnosis not present

## 2015-07-14 DIAGNOSIS — Z9484 Stem cells transplant status: Secondary | ICD-10-CM | POA: Diagnosis not present

## 2015-07-14 DIAGNOSIS — C9000 Multiple myeloma not having achieved remission: Secondary | ICD-10-CM | POA: Diagnosis not present

## 2015-07-14 DIAGNOSIS — R6520 Severe sepsis without septic shock: Secondary | ICD-10-CM

## 2015-07-14 DIAGNOSIS — A419 Sepsis, unspecified organism: Secondary | ICD-10-CM

## 2015-07-14 DIAGNOSIS — Z95828 Presence of other vascular implants and grafts: Secondary | ICD-10-CM

## 2015-07-14 LAB — PREPARE PLATELET PHERESIS: UNIT DIVISION: 0

## 2015-07-14 LAB — COMPREHENSIVE METABOLIC PANEL
ALBUMIN: 3.1 g/dL — AB (ref 3.5–5.0)
ALBUMIN: 3.5 g/dL (ref 3.5–5.0)
ALK PHOS: 86 U/L (ref 40–150)
ALK PHOS: 85 U/L (ref 38–126)
ALT: 20 U/L (ref 0–55)
ALT: 23 U/L (ref 17–63)
ANION GAP: 10 meq/L (ref 3–11)
ANION GAP: 10 (ref 5–15)
AST: 28 U/L (ref 5–34)
AST: 38 U/L (ref 15–41)
BILIRUBIN TOTAL: 0.92 mg/dL (ref 0.20–1.20)
BILIRUBIN TOTAL: 1.3 mg/dL — AB (ref 0.3–1.2)
BUN: 25 mg/dL (ref 7.0–26.0)
BUN: 27 mg/dL — ABNORMAL HIGH (ref 6–20)
CALCIUM: 16.5 mg/dL — AB (ref 8.4–10.4)
CO2: 27 mEq/L (ref 22–29)
CO2: 28 mmol/L (ref 22–32)
CREATININE: 1.4 mg/dL — AB (ref 0.7–1.3)
Chloride: 101 mEq/L (ref 98–109)
Chloride: 99 mmol/L — ABNORMAL LOW (ref 101–111)
Creatinine, Ser: 1.32 mg/dL — ABNORMAL HIGH (ref 0.61–1.24)
EGFR: 68 mL/min/{1.73_m2} — ABNORMAL LOW (ref >90)
GFR calc non Af Amer: >60 mL/min (ref >60)
GLUCOSE: 140 mg/dL — AB (ref 65–99)
Glucose: 170 mg/dl — ABNORMAL HIGH (ref 70–140)
POTASSIUM: 3.8 mmol/L (ref 3.5–5.1)
Potassium: 4.2 mEq/L (ref 3.5–5.1)
SODIUM: 137 mmol/L (ref 135–145)
Sodium: 138 mEq/L (ref 136–145)
TOTAL PROTEIN: 6.9 g/dL (ref 6.4–8.3)
TOTAL PROTEIN: 7.1 g/dL (ref 6.5–8.1)

## 2015-07-14 LAB — IGG, IGA, IGM
IGG (IMMUNOGLOBIN G), SERUM: 406 mg/dL — AB (ref 650–1600)
IgA: 27 mg/dL — ABNORMAL LOW (ref 68–379)
IgM, Serum: 9 mg/dL — ABNORMAL LOW (ref 41–251)

## 2015-07-14 LAB — BASIC METABOLIC PANEL
Anion gap: 7 (ref 5–15)
BUN: 24 mg/dL — AB (ref 6–20)
CALCIUM: 14.6 mg/dL — AB (ref 8.9–10.3)
CHLORIDE: 109 mmol/L (ref 101–111)
CO2: 26 mmol/L (ref 22–32)
CREATININE: 1.27 mg/dL — AB (ref 0.61–1.24)
Glucose, Bld: 163 mg/dL — ABNORMAL HIGH (ref 65–99)
Potassium: 4.1 mmol/L (ref 3.5–5.1)
SODIUM: 142 mmol/L (ref 135–145)

## 2015-07-14 LAB — LACTIC ACID, PLASMA: LACTIC ACID, VENOUS: 3 mmol/L — AB (ref 0.5–2.0)

## 2015-07-14 LAB — CBC WITH DIFFERENTIAL/PLATELET
BASO%: 0.2 % (ref 0.0–2.0)
BASOS ABS: 0 10*3/uL (ref 0.0–0.1)
EOS%: 0 % (ref 0.0–7.0)
Eosinophils Absolute: 0 10*3/uL (ref 0.0–0.5)
HCT: 31.1 % — ABNORMAL LOW (ref 38.4–49.9)
HGB: 10.4 g/dL — ABNORMAL LOW (ref 13.0–17.1)
LYMPH%: 25.2 % (ref 14.0–49.0)
MCH: 28 pg (ref 27.2–33.4)
MCHC: 33.4 g/dL (ref 32.0–36.0)
MCV: 83.6 fL (ref 79.3–98.0)
MONO#: 0.6 10*3/uL (ref 0.1–0.9)
MONO%: 13.3 % (ref 0.0–14.0)
NEUT%: 61.3 % (ref 39.0–75.0)
NEUTROS ABS: 2.6 10*3/uL (ref 1.5–6.5)
NRBC: 2 % — AB (ref 0–0)
Platelets: 37 10*3/uL — ABNORMAL LOW (ref 140–400)
RBC: 3.72 10*6/uL — AB (ref 4.20–5.82)
RDW: 16.7 % — AB (ref 11.0–14.6)
WBC: 4.2 10*3/uL (ref 4.0–10.3)
lymph#: 1.1 10*3/uL (ref 0.9–3.3)

## 2015-07-14 LAB — PROTEIN ELECTROPHORESIS, SERUM, WITH REFLEX
ABNORMAL PROTEIN BAND1: 1.6 g/dL
ALPHA-1-GLOBULIN: 0.6 g/dL — AB (ref 0.2–0.3)
Albumin ELP: 3.3 g/dL — ABNORMAL LOW (ref 3.8–4.8)
Alpha-2-Globulin: 0.9 g/dL (ref 0.5–0.9)
BETA 2: 0.4 g/dL (ref 0.2–0.5)
Beta Globulin: 0.4 g/dL (ref 0.4–0.6)
GAMMA GLOBULIN: 0.4 g/dL — AB (ref 0.8–1.7)
Total Protein, Serum Electrophoresis: 5.9 g/dL — ABNORMAL LOW (ref 6.1–8.1)

## 2015-07-14 LAB — URINALYSIS, ROUTINE W REFLEX MICROSCOPIC
Bilirubin Urine: NEGATIVE
Glucose, UA: NEGATIVE mg/dL
Ketones, ur: NEGATIVE mg/dL
LEUKOCYTES UA: NEGATIVE
NITRITE: NEGATIVE
PH: 6.5 (ref 5.0–8.0)
Protein, ur: 100 mg/dL — AB
SPECIFIC GRAVITY, URINE: 1.015 (ref 1.005–1.030)

## 2015-07-14 LAB — URINE MICROSCOPIC-ADD ON

## 2015-07-14 LAB — KAPPA/LAMBDA LIGHT CHAINS
Kappa free light chain: 171 mg/dL — ABNORMAL HIGH (ref 0.33–1.94)
LAMBDA FREE LGHT CHN: 0.14 mg/dL — AB (ref 0.57–2.63)

## 2015-07-14 LAB — I-STAT CG4 LACTIC ACID, ED
LACTIC ACID, VENOUS: 2.76 mmol/L — AB (ref 0.5–2.0)
LACTIC ACID, VENOUS: 5.48 mmol/L — AB (ref 0.5–2.0)

## 2015-07-14 LAB — CBC
HEMATOCRIT: 30.4 % — AB (ref 39.0–52.0)
HEMOGLOBIN: 10.1 g/dL — AB (ref 13.0–17.0)
MCH: 27.7 pg (ref 26.0–34.0)
MCHC: 33.2 g/dL (ref 30.0–36.0)
MCV: 83.5 fL (ref 78.0–100.0)
Platelets: 39 10*3/uL — ABNORMAL LOW (ref 150–400)
RBC: 3.64 MIL/uL — AB (ref 4.22–5.81)
RDW: 16.6 % — ABNORMAL HIGH (ref 11.5–15.5)
WBC: 4.6 10*3/uL (ref 4.0–10.5)

## 2015-07-14 LAB — PROCALCITONIN: PROCALCITONIN: 1 ng/mL

## 2015-07-14 LAB — LIPASE, BLOOD: Lipase: 19 U/L (ref 11–51)

## 2015-07-14 LAB — IFE INTERPRETATION

## 2015-07-14 MED ORDER — SODIUM CHLORIDE 0.9 % IV SOLN: 250.0000 mL | INTRAVENOUS | Status: DC | PRN

## 2015-07-14 MED ORDER — GABAPENTIN 300 MG PO CAPS: 600.0000 mg | ORAL_CAPSULE | Freq: Three times a day (TID) | ORAL | Status: DC

## 2015-07-14 MED ORDER — CYCLOBENZAPRINE HCL 10 MG PO TABS: 10.0000 mg | ORAL_TABLET | Freq: Three times a day (TID) | ORAL | Status: DC | PRN

## 2015-07-14 MED ORDER — SODIUM CHLORIDE 0.9 % IV SOLN
INTRAVENOUS | Status: DC
  Administered 2015-07-14 – 2015-07-17 (×4): via INTRAVENOUS

## 2015-07-14 MED ORDER — PROCHLORPERAZINE MALEATE 10 MG PO TABS
10.0000 mg | ORAL_TABLET | Freq: Four times a day (QID) | ORAL | Status: DC | PRN
  Filled 2015-07-14: qty 1

## 2015-07-14 MED ORDER — MONTELUKAST SODIUM 10 MG PO TABS
10.0000 mg | ORAL_TABLET | Freq: Every day | ORAL | Status: DC
  Administered 2015-07-15 (×2): 10 mg via ORAL
  Filled 2015-07-14 (×3): qty 1

## 2015-07-14 MED ORDER — PANTOPRAZOLE SODIUM 40 MG PO TBEC
40.0000 mg | DELAYED_RELEASE_TABLET | Freq: Every day | ORAL | Status: DC
  Administered 2015-07-14 – 2015-07-17 (×3): 40 mg via ORAL
  Filled 2015-07-14 (×3): qty 1

## 2015-07-14 MED ORDER — BISACODYL 5 MG PO TBEC
5.0000 mg | DELAYED_RELEASE_TABLET | Freq: Every day | ORAL | Status: DC | PRN
  Filled 2015-07-14: qty 1

## 2015-07-14 MED ORDER — SODIUM CHLORIDE 0.9 % IJ SOLN
10.0000 mL | INTRAMUSCULAR | Status: DC | PRN
  Administered 2015-07-14: 10 mL via INTRAVENOUS
  Filled 2015-07-14: qty 10

## 2015-07-14 MED ORDER — CALCITONIN (SALMON) 200 UNIT/ML IJ SOLN
344.0000 [IU] | Freq: Two times a day (BID) | INTRAMUSCULAR | Status: DC
  Administered 2015-07-15: 344 [IU] via INTRAMUSCULAR
  Filled 2015-07-14 (×2): qty 1.72

## 2015-07-14 MED ORDER — CALCITONIN (SALMON) 200 UNIT/ML IJ SOLN
4.0000 [IU]/kg | Freq: Once | INTRAMUSCULAR | Status: AC
  Administered 2015-07-14: 344 [IU] via SUBCUTANEOUS
  Filled 2015-07-14: qty 1.72

## 2015-07-14 MED ORDER — ONDANSETRON 8 MG PO TBDP
8.0000 mg | ORAL_TABLET | Freq: Three times a day (TID) | ORAL | Status: DC | PRN
  Administered 2015-07-15: 8 mg via ORAL
  Filled 2015-07-14 (×2): qty 1

## 2015-07-14 MED ORDER — FAMCICLOVIR 500 MG PO TABS
500.0000 mg | ORAL_TABLET | Freq: Every day | ORAL | Status: DC
  Administered 2015-07-15: 500 mg via ORAL
  Filled 2015-07-14 (×3): qty 1

## 2015-07-14 MED ORDER — FENTANYL 100 MCG/HR TD PT72
200.0000 ug | MEDICATED_PATCH | TRANSDERMAL | Status: DC
  Administered 2015-07-14 – 2015-07-16 (×2): 200 ug via TRANSDERMAL
  Filled 2015-07-14: qty 2
  Filled 2015-07-14: qty 8

## 2015-07-14 MED ORDER — PIPERACILLIN-TAZOBACTAM 3.375 G IVPB 30 MIN
3.3750 g | Freq: Once | INTRAVENOUS | Status: AC
  Administered 2015-07-14: 3.375 g via INTRAVENOUS
  Filled 2015-07-14: qty 50

## 2015-07-14 MED ORDER — SODIUM CHLORIDE 0.9 % IV SOLN: 90.0000 mg | Freq: Once | INTRAVENOUS | Status: DC

## 2015-07-14 MED ORDER — LORAZEPAM 0.5 MG PO TABS
0.5000 mg | ORAL_TABLET | Freq: Four times a day (QID) | ORAL | Status: DC | PRN
  Administered 2015-07-15: 0.5 mg via ORAL
  Filled 2015-07-14: qty 1

## 2015-07-14 MED ORDER — ZOLEDRONIC ACID 4 MG/5ML IV CONC
4.0000 mg | Freq: Once | INTRAVENOUS | Status: AC
  Administered 2015-07-14: 4 mg via INTRAVENOUS
  Filled 2015-07-14: qty 5

## 2015-07-14 MED ORDER — FENTANYL CITRATE (PF) 100 MCG/2ML IJ SOLN
25.0000 ug | INTRAMUSCULAR | Status: DC | PRN
  Administered 2015-07-17: 50 ug via INTRAVENOUS
  Filled 2015-07-14: qty 2

## 2015-07-14 MED ORDER — SODIUM CHLORIDE 0.9 % IV BOLUS (SEPSIS)
1000.0000 mL | INTRAVENOUS | Status: AC
  Administered 2015-07-14 (×3): 1000 mL via INTRAVENOUS

## 2015-07-14 MED ORDER — VANCOMYCIN HCL IN DEXTROSE 1-5 GM/200ML-% IV SOLN: 1000.0000 mg | Freq: Two times a day (BID) | INTRAVENOUS | Status: DC

## 2015-07-14 MED ORDER — VANCOMYCIN HCL IN DEXTROSE 1-5 GM/200ML-% IV SOLN
1000.0000 mg | Freq: Once | INTRAVENOUS | Status: AC
  Administered 2015-07-14: 1000 mg via INTRAVENOUS
  Filled 2015-07-14: qty 200

## 2015-07-14 MED ORDER — PIPERACILLIN-TAZOBACTAM 3.375 G IVPB
3.3750 g | Freq: Three times a day (TID) | INTRAVENOUS | Status: DC
Start: 2015-07-14 — End: 2015-07-14

## 2015-07-14 MED ORDER — HEPARIN SOD (PORK) LOCK FLUSH 100 UNIT/ML IV SOLN
500.0000 [IU] | Freq: Once | INTRAVENOUS | Status: AC
  Administered 2015-07-14: 500 [IU] via INTRAVENOUS
  Filled 2015-07-14: qty 5

## 2015-07-14 NOTE — ED Provider Notes (Addendum)
CSN: [id number]     Arrival date & time 07/14/15  1424 History   First MD Initiated Contact with Patient 07/14/15 1509     Chief Complaint  Patient presents with  . Hypercalcemia    Level V caveat unstable vital signs. Altered mental status history is obtained from hospital records and from patient's wife who accompanies him  (Consider location/radiation/quality/duration/timing/severity/associated sxs/prior Treatment) HPI Patient here for hyper hypercalcemia. Serum calcium noted to be 15.7 yesterday. His wife reports that he's been not feeling well, progressively weaker over the past 2-3 days. He had a temperature 101.5 3 days ago. Treated with Tylenol 3 days ago. Patient complains of anterior chest pain presently. No cough. No other associated symptoms. No other treatments prior to coming here. Patient was able to walk with walker earlier today. He normally walks unassisted. Past Medical History  Diagnosis Date  . History of radiation therapy 02/07/13- 02/26/13    lower L spine, upper sacrum, 35 gray in 14 fractions  . FH: chemotherapy     Dr. Burney Gauze  . Cancer ([place])   . Multiple myeloma ([place]) dx'd 01/2013  . GERD (gastroesophageal reflux disease)   . Radiation 01/01/2015-01/14/2015    lower lumbar spine/upper sacrum area20 gray    Past Surgical History  Procedure Laterality Date  . Portacath placement    . 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 Kyphoplasty  05/20/14  . Lumbar laminectomy/decompression microdiscectomy N/A 09/25/2014    Procedure: LUMBAR LAMINECTOMY/DECOMPRESSION MICRODISCECTOMY;  Surgeon: Sinclair Ship, MD;  Location: [place];  Service: Orthopedics;  Laterality: N/A;  Lumbar 4-5, L5-S1  decompression  . Esophagogastroduodenoscopy N/A 02/06/2015    Procedure: ESOPHAGOGASTRODUODENOSCOPY (EGD);  Surgeon: Teena Irani, MD;  Location: [facility];  Service: Endoscopy;  Laterality: N/A;   Family History  Problem Relation Age of Onset  . Diabetic  kidney disease Mother   . Hypertension Mother   . Heart attack Mother   . Kidney failure Mother   . Coronary artery disease Mother   . HIV Father    Social History  Substance Use Topics  . Smoking status: Former Smoker -- 1.00 packs/day for 15 years    Types: Cigarettes    Start date: 06/29/1988    Quit date: 06/29/2003  . Smokeless tobacco: Never Used     Comment: quit smoking 10 years ago  . Alcohol Use: No    Review of Systems  Unable to perform ROS: Unstable vital signs      Allergies  Review of patient's allergies indicates no known allergies.  Home Medications   Prior to Admission medications   Medication Sig Start Date End Date Taking? Authorizing Provider  bisacodyl (DULCOLAX) 5 MG EC tablet Take 5 mg by mouth daily as needed for moderate constipation (constipation).     Historical Provider, MD  cyclobenzaprine (FLEXERIL) 10 MG tablet Take 1 tablet (10 mg total) by mouth 3 (three) times daily as needed for muscle spasms. 06/16/15   [name], MD  famciclovir (FAMVIR) 500 MG tablet Take 1 tablet (500 mg total) by mouth daily. 02/11/15   [name], MD  fentaNYL (DURAGESIC - DOSED MCG/HR) 100 MCG/HR Place 2 patches (200 mcg total) onto the skin every other day. 07/04/15   [name], MD  gabapentin (NEURONTIN) 300 MG capsule Take 2 capsules (600 mg total) by mouth 3 (three) times daily. 05/05/15   [name], MD  lidocaine-prilocaine (EMLA) cream Apply to affected  area once 02/18/15   [name], MD  LORazepam (ATIVAN) 0.5 MG tablet Take 1 tablet (0.5 mg total) by mouth every 6 (six) hours as needed (Nausea or vomiting). 02/18/15   [name], MD  morphine (MSIR) 30 MG tablet Take 1/2-1 pill, IF NEEDED, every 4 hrs for pain. 07/04/15   [name], MD  ondansetron (ZOFRAN ODT) 8 MG disintegrating tablet Take 1 tablet (8 mg total) by mouth every 8 (eight) hours as needed for nausea or vomiting. 02/04/15   [name], MD  pantoprazole  (PROTONIX) 40 MG tablet Take 1 tablet (40 mg total) by mouth daily. Patient taking differently: Take 40 mg by mouth daily as needed (indigestion).  05/05/15   [name], MD  prochlorperazine (COMPAZINE) 10 MG tablet Take 1 tablet (10 mg total) by mouth every 6 (six) hours as needed (Nausea or vomiting). 02/18/15   [name], MD   BP 120/82 mmHg  Pulse 140  Temp(Src) 98.2 F (36.8 C) (Oral)  Resp 25  SpO2 98% Physical Exam  Constitutional:  Chronically ill-appearing  HENT:  Head: Normocephalic and atraumatic.  Mucous membranes dry  Eyes: Conjunctivae are normal. Pupils are equal, round, and reactive to light.  Neck: Neck supple. No tracheal deviation present. No thyromegaly present.  Cardiovascular: Regular rhythm.   No murmur heard. Tachycardic  Pulmonary/Chest: Effort normal and breath sounds normal.  Port-A-Cath in place right chest, site nontender or red  Abdominal: Soft. Bowel sounds are normal. He exhibits no distension. There is no tenderness.  Musculoskeletal: Normal range of motion. He exhibits no edema or tenderness.  Neurological: Coordination normal.  Opens eyes to verbal stimulus, moves all extremities cranial nerves II through XII grossly intact  Skin: Skin is warm and dry. No rash noted.  Psychiatric: He has a normal mood and affect.  Nursing note and vitals reviewed.   ED Course  Procedures (including critical care time) Labs Review Labs Reviewed  LIPASE, BLOOD  COMPREHENSIVE METABOLIC PANEL  CBC  URINALYSIS, ROUTINE W REFLEX MICROSCOPIC (NOT AT [facility])    Imaging Review No results found. I have personally reviewed and evaluated these images and lab results as part of my medical decision-making.   EKG Interpretation None     ED ECG REPORT   Date: 07/14/2015  Rate: 130  Rhythm: sinus tachycardia  QRS Axis: normal  Intervals: normal  ST/T Wave abnormalities: nonspecific T wave changes  Conduction Disutrbances:none  Narrative  Interpretation:   Old EKG Reviewed: Changes of pericarditis from 06/10/2015 have resolved  I have personally reviewed the EKG tracing and agree with the computerized printout as noted.  4:45 PM patient feels improved and is more alert, Glasgow Coma Score 15 after treatment with 1 L of normal saline and intravenous antibiotics  6:40 PM patient is alert Glasgow Coma Score 15 talkative appears more comfortable Results for orders placed or performed during the hospital encounter of 07/14/15  Lipase, blood  Result Value Ref Range   Lipase 19 11 - 51 U/L  Comprehensive metabolic panel  Result Value Ref Range   Sodium 137 135 - 145 mmol/L   Potassium 3.8 3.5 - 5.1 mmol/L   Chloride 99 (L) 101 - 111 mmol/L   CO2 28 22 - 32 mmol/L   Glucose, Bld 140 (H) 65 - 99 mg/dL   BUN 27 (H) 6 - 20 mg/dL   Creatinine, Ser 1.32 (H) 0.61 - 1.24 mg/dL   Calcium >15.0 (HH) 8.9 - 10.3  mg/dL   Total Protein 7.1 6.5 - 8.1 g/dL   Albumin 3.5 3.5 - 5.0 g/dL   AST 38 15 - 41 U/L   ALT 23 17 - 63 U/L   Alkaline Phosphatase 85 38 - 126 U/L   Total Bilirubin 1.3 (H) 0.3 - 1.2 mg/dL   GFR calc non Af Amer >60 >60 mL/min   GFR calc Af Amer >60 >60 mL/min   Anion gap 10 5 - 15  CBC  Result Value Ref Range   WBC 4.6 4.0 - 10.5 K/uL   RBC 3.64 (L) 4.22 - 5.81 MIL/uL   Hemoglobin 10.1 (L) 13.0 - 17.0 g/dL   HCT 30.4 (L) 39.0 - 52.0 %   MCV 83.5 78.0 - 100.0 fL   MCH 27.7 26.0 - 34.0 pg   MCHC 33.2 30.0 - 36.0 g/dL   RDW 16.6 (H) 11.5 - 15.5 %   Platelets 39 (L) 150 - 400 K/uL  Urinalysis, Routine w reflex microscopic (not at [facility])  Result Value Ref Range   Color, Urine YELLOW YELLOW   APPearance CLOUDY (A) CLEAR   Specific Gravity, Urine 1.015 1.005 - 1.030   pH 6.5 5.0 - 8.0   Glucose, UA NEGATIVE NEGATIVE mg/dL   Hgb urine dipstick TRACE (A) NEGATIVE   Bilirubin Urine NEGATIVE NEGATIVE   Ketones, ur NEGATIVE NEGATIVE mg/dL   Protein, ur 100 (A) NEGATIVE mg/dL   Nitrite NEGATIVE NEGATIVE   Leukocytes,  UA NEGATIVE NEGATIVE  Urine microscopic-add on  Result Value Ref Range   Squamous Epithelial / LPF 0-5 (A) NONE SEEN   WBC, UA 0-5 0 - 5 WBC/hpf   RBC / HPF 0-5 0 - 5 RBC/hpf   Bacteria, UA RARE (A) NONE SEEN   Urine-Other AMORPHOUS URATES/PHOSPHATES   I-Stat CG4 Lactic Acid, ED  (not at  [facility])  Result Value Ref Range   Lactic Acid, Venous 2.76 (HH) 0.5 - 2.0 mmol/L   Comment NOTIFIED PHYSICIAN   I-Stat CG4 Lactic Acid, ED  (not at  [facility])  Result Value Ref Range   Lactic Acid, Venous 5.48 (HH) 0.5 - 2.0 mmol/L   Comment NOTIFIED PHYSICIAN    Mr Cervical Spine W Wo Contrast  06/15/2015  CLINICAL DATA:  Multiple myeloma in relapse. EXAM: MRI CERVICAL SPINE WITHOUT AND WITH CONTRAST TECHNIQUE: Multiplanar and multiecho pulse sequences of the cervical spine, to include the craniocervical junction and cervicothoracic junction, were obtained according to standard protocol without and with intravenous contrast. CONTRAST:  57m MULTIHANCE GADOBENATE DIMEGLUMINE 529 MG/ML IV SOLN COMPARISON:  PET 02/26/2015 FINDINGS: Pathologic fracture C5 vertebral body with moderate loss of vertebral body height. There is diffuse enhancement of the vertebral bodies as well as enhancement in the ventral epidural soft tissues on the right compatible with tumor extension into the epidural space. Ill-defined bone marrow abnormalities at C7, T1, T2, T3 show slight enhancement and are most consistent with tumor. There is also enhancing tumor involving the C7 spinous process and adjacent soft tissues. No other areas of epidural tumor. Spinal cord signal normal.  No cord edema.  No cord compression. Negative for disc protrusion. 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 No cord compression. Multiple additional areas of bone marrow involvement by tumor. Electronically Signed   By: CFranchot GalloM.D.   On: 06/15/2015 16:05   Mr Lumbar Spine W Wo Contrast  06/15/2015  CLINICAL DATA:   Multiple myeloma in relapse.  Back pain EXAM: MRI  LUMBAR SPINE WITHOUT AND WITH CONTRAST TECHNIQUE: Multiplanar and multiecho pulse sequences of the lumbar spine were obtained without and with intravenous contrast. CONTRAST:  16 mL MultiHance IV COMPARISON:  Lumbar MRI 12/01/2014 FINDINGS: Post radiation fatty changes in the marrow at L4, L5, and sacrum. Bone marrow throughout the L3 vertebral body and above shows interval change with patchy small areas of hypo intensity on T1. Several of these areas enhance especially involving the L2 vertebral body and L3 vertebral body. This is compatible with progression of myeloma. There is progression of tumor in the right-side of L5 extending into the transverse process. 6 mm lesion right iliac bone unchanged. Pathologic fracture involving T12 is unchanged. Posterior decompression at L5-S1 unchanged. Chronic degenerative changes throughout the lumbar spine are stable with mild spinal stenosis at L2-3, L3-4. IMPRESSION: Progression of bone marrow changes consistent with multiple myeloma from T12 through L3 as well as in the right L5 vertebral body extending into the transverse process. Chronic pathologic fracture T12.  No new fracture identified. Electronically Signed   By: Franchot Gallo M.D.   On: 06/15/2015 16:18   Dg Chest Port 1 View  07/14/2015  CLINICAL DATA:  Multiple myeloma. Hypercalcemia. Fatigue. Decreased p.o. intake. EXAM: PORTABLE CHEST - 1 VIEW COMPARISON:  One-view chest 06/24/2015 FINDINGS: The heart size is normal. The lung volumes are low. A right IJ Port-A-Cath is stable in position. A left lateral lung mass is stable. No other focal airspace disease is present. The lung volumes are low. The visualized soft tissues and bony thorax are unremarkable. IMPRESSION: 1. No acute abnormality or significant interval change. 2. Stable left lateral lung mass. 3. The Port-A-Cath is stable in position. Electronically Signed   By: San Morelle M.D.   On:  07/14/2015 16:01   Dg Chest Port 1 View  06/24/2015  CLINICAL DATA:  Stage IV multiple myeloma.  Fever for 5 days EXAM: PORTABLE CHEST 1 VIEW COMPARISON:  June 10, 2015 FINDINGS: There is again noted a mass arising from the pleura on the left, currently measuring approximately 4.9 x 4.0 cm. The lungs elsewhere are clear. No edema or consolidation. Heart size and pulmonary vascularity are normal. Lucencies in each lateral clavicle most likely are secondary to the known multiple myeloma. A lesion in the lateral right seventh rib is also likely due to multiple myeloma. Port-A-Cath tip is at the cavoatrial junction. No pneumothorax. Previous left-sided catheter is no longer appreciable. IMPRESSION: Mass in the periphery of the left lung again noted consistent with the known multiple myeloma. Bony changes of multiple myeloma are noted as well. No lung edema or consolidation. No change in cardiac silhouette. Electronically Signed   By: Lowella Grip III M.D.   On: 06/24/2015 18:34     MDM  Code sepsis called based on Sirs criteria fever, tachycardia, tachypnea. Final diagnoses:  None   Dr Corinna Lines from intensive care service consulted who will arange for admission to intensive care unit Patient meets clinical criteria for severe sepsis with at least 2 SIRS, evidence of end organ damage  acute kidney , altered mental status injury andlactic acid elevation and suspected source of . Responsive to fluid resuscitation.  Dx #1 severe sepsis #2 hyperglycemia #3hypercalcemia #4 acute kidney injury CRITICAL CARE Performed by: Orlie Dakin Total critical care time: 60 minutes Critical care time was exclusive of separately billable procedures and treating other patients. Critical care was necessary to treat or prevent imminent or life-threatening deterioration. Critical care was time spent personally by  me on the following activities: development of treatment plan with patient and/or surrogate as well  as nursing, discussions with consultants, evaluation of patient's response to treatment, examination of patient, obtaining history from patient or surrogate, ordering and performing treatments and interventions, ordering and review of laboratory studies, ordering and review of radiographic studies, pulse oximetry and re-evaluation of patient's condition.   Orlie Dakin, MD 07/14/15 1926  10:55 PM patient be transferred to [facility] admitting physician's request. Patient okay with transfer the patient resting comfortably presently he is alert, Glasgow coma score 15. Remains tachycardic  Orlie Dakin, MD 07/14/15 2256

## 2015-07-14 NOTE — Progress Notes (Signed)
Patient listed as having [employer] insurance without a pcp.  EDCM spoke to patient's wife at bedside, patient sleeping.  EDCM provided patient's wife with list of pcps who accept [employer] insurnace within a ten mile radius of patient's zip code [phone].  Patient's wife thankful for resources.  No further EDCM needs at this time.

## 2015-07-14 NOTE — ED Notes (Signed)
Pt comes from [place] [facility] today. Was here for radiation therapy. Nurse arrived to bathroom where patient was vomiting. Calcium 16.5. Unsteady upon standing. A&Ox4. Wife at bedside.

## 2015-07-14 NOTE — ED Notes (Signed)
Notified edp and nurse from istat lactic acid

## 2015-07-14 NOTE — H&P (Signed)
PULMONARY / CRITICAL CARE MEDICINE   Name: Mark [name] MRN: [id number] DOB: Sep 03, 1970    ADMISSION DATE:  07/14/2015 CONSULTATION DATE:  07/14/15  REFERRING MD:  EDP  CHIEF COMPLAINT:  Pain  HISTORY OF PRESENT ILLNESS: Mark [name] is a 44 y.o. 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 Marin Olp).  He has apparently not had any response with salvage chemotherapy and has been through all lines of chemotherapy available per Dr. Antonieta Pert H&P from pt's last admission 06/24/15 when he was admitted for hypercalcemia.  On 12/13, he presented to [facility] ED with pain similar to his typical pain from his known multiple myeloma.  His calcium was once again noted to be significantly elevated at > 15.  He has not had any fevers/chills/sweats, chest pain, SOB, cough, N/V/D, abd pain.  He does endorse weakness, fatigue, and bone pain in his legs.   He was given 344 units of calcitonin (4units/kg) as well as zoledronic acid (81m).  PCCM was called for admission.  After review of his H&P and discharge summary from last admission 06/24/15, Dr. EMarin Olphad extensive discussion with pt and his wife regarding his advanced stage / refractory multiple myeloma and the fact that he has recurrent hypercalcemia which is a poor prognostic indicator.  Due to the severity of his disease, decision was made to list code status as FULL DNR as pt stated that he would not want to rely on life support machines, undergo CPR with high likelihood of rib fractures, etc.  This was confirmed with pt and his wife on this admission.  Pt was adamant against life support.  His wife was emotional and was hesitant to agree with pt's wishes; however, she did state that at the end of the day, it is Mr. KGettisdecision and she will respect his wishes.   PAST MEDICAL HISTORY :  He  has a past medical history of History of radiation therapy (02/07/13- 02/26/13);  FH[place]; [place] Multiple myeloma (H[facility]) (dx'd 01/2013); GERD (gastroesophageal reflux disease); and Radiation (01/01/2015-01/14/2015).  PAST SURGICAL HISTORY: He  has past surgical history that includes Portacath placement; Bone marrow transplant (09/12/13); Kyphoplasty (05/20/14); Lumbar laminectomy/decompression microdiscectomy (N/A, 09/25/2014); and Esophagogastroduodenoscopy (N/A, 02/06/2015).  No Known Allergies  No current facility-administered medications on file prior to encounter.   Current Outpatient Prescriptions on File Prior to Encounter  Medication Sig  . bisacodyl (DULCOLAX) 5 MG EC tablet Take 5 mg by mouth daily as needed for moderate constipation (constipation).   . cyclobenzaprine (FLEXERIL) 10 MG tablet Take 1 tablet (10 mg total) by mouth 3 (three) times daily as needed for muscle spasms.  . famciclovir (FAMVIR) 500 MG tablet Take 1 tablet (500 mg total) by mouth daily.  . fentaNYL (DURAGESIC - DOSED MCG/HR) 100 MCG/HR Place 2 patches (200 mcg total) onto the skin every other day.  . gabapentin (NEURONTIN) 300 MG capsule Take 2 capsules (600 mg total) by mouth 3 (three) times daily.  .Marland Kitchenlidocaine-prilocaine (EMLA) cream Apply to affected area once  . LORazepam (ATIVAN) 0.5 MG tablet Take 1 tablet (0.5 mg total) by mouth every 6 (six) hours as needed (Nausea or vomiting).  . morphine (MSIR) 30 MG tablet Take 1/2-1 pill, IF NEEDED, every 4 hrs for pain.  .Marland Kitchenondansetron (ZOFRAN ODT) 8 MG disintegrating tablet Take 1 tablet (8 mg total) by mouth every 8 (eight) hours as needed for nausea or vomiting.  . pantoprazole (  PROTONIX) 40 MG tablet Take 1 tablet (40 mg total) by mouth daily. (Patient taking differently: Take 40 mg by mouth daily as needed (indigestion). )  . prochlorperazine (COMPAZINE) 10 MG tablet Take 1 tablet (10 mg total) by mouth every 6 (six) hours as needed (Nausea or vomiting).    FAMILY HISTORY:  His indicated that his mother is deceased. He indicated  that his father is deceased.   SOCIAL HISTORY: He  reports that he quit smoking about 12 years ago. His smoking use included Cigarettes. He started smoking about 27 years ago. He has a 15 pack-year smoking history. He has never used smokeless tobacco. He reports that he does not drink alcohol or use illicit drugs.  REVIEW OF SYSTEMS:   All negative; except for those that are bolded, which indicate positives.  Constitutional: weight loss, weight gain, night sweats, fevers, chills, fatigue, weakness.  HEENT: headaches, sore throat, sneezing, nasal congestion, post nasal drip, difficulty swallowing, tooth/dental problems, visual complaints, visual changes, ear aches. Neuro: difficulty with speech, weakness, numbness, ataxia. CV:  chest pain, orthopnea, PND, swelling in lower extremities, dizziness, palpitations, syncope.  Resp: cough, hemoptysis, dyspnea, wheezing. GI  heartburn, indigestion, abdominal pain, nausea, vomiting, diarrhea, constipation, change in bowel habits, loss of appetite, hematemesis, melena, hematochezia.  GU: dysuria, change in color of urine, urgency or frequency, flank pain, hematuria. MSK: joint pain or swelling, decreased range of motion, bone pain. Psych: change in mood or affect, depression, anxiety, suicidal ideations, homicidal ideations. Skin: rash, itching, bruising.   SUBJECTIVE: Complains of bone pain in his legs.  VITAL SIGNS: BP 133/77 mmHg  Pulse 135  Temp(Src) 100.4 F (38 C) (Rectal)  Resp 19  Wt 86.183 kg (190 lb)  SpO2 99%  HEMODYNAMICS:    VENTILATOR SETTINGS:    INTAKE / OUTPUT:     PHYSICAL EXAMINATION: General: Adult AA male, in NAD. Neuro: A&O x 3, non-focal.  HEENT: [place]/AT. PERRL, sclerae anicteric. Cardiovascular: Tachy, regular, no M/R/G.  Lungs: Respirations even and unlabored.  CTA bilaterally, No W/R/R. Abdomen: BS x 4, soft, NT/ND.  Musculoskeletal: No gross deformities, no edema.  Skin: Intact, warm, no  rashes.  LABS:  BMET  Recent Labs Lab 07/10/15 1433 07/13/15 1027 07/14/15 1250 07/14/15 1505  NA 136 136 138 137  K 3.8 4.1 4.2 3.8  CL 99 96*  --  99*  CO2 _0 BUN 20 23* 25.0 27*  CREATININE 1.6* 1.2 1.4* 1.32*  GLUCOSE 155* 231* 170* 140*    Electrolytes  Recent Labs Lab 07/13/15 1027 07/14/15 1250 07/14/15 1505  CALCIUM 15.7* 16.5* >15.0*    CBC  Recent Labs Lab 07/13/15 1027 07/14/15 1250 07/14/15 1505  WBC 5.9 4.2 4.6  HGB 10.1* 10.4* 10.1*  HCT 30.8* 31.1* 30.4*  PLT <6* 37* 39*    Coag's No results for input(s): APTT, INR in the last 168 hours.  Sepsis Markers  Recent Labs Lab 07/14/15 1530 07/14/15 1856  LATICACIDVEN 2.76* 5.48*    ABG No results for input(s): PHART, PCO2ART, PO2ART in the last 168 hours.  Liver Enzymes  Recent Labs Lab 07/13/15 1027 07/14/15 1250 07/14/15 1505  AST 33 28 38  ALT _1 ALKPHOS 74 86 85  BILITOT 1.30 0.92 1.3*  ALBUMIN 2.9* 3.1* 3.5    Cardiac Enzymes No results for input(s): TROPONINI, PROBNP in the last 168 hours.  Glucose No results for input(s): GLUCAP in the last 168 hours.  Imaging Dg Chest [facility]  1 View  07/14/2015  CLINICAL DATA:  Multiple myeloma. Hypercalcemia. Fatigue. Decreased p.o. intake. EXAM: PORTABLE CHEST - 1 VIEW COMPARISON:  One-view chest 06/24/2015 FINDINGS: The heart size is normal. The lung volumes are low. A right IJ Port-A-Cath is stable in position. A left lateral lung mass is stable. No other focal airspace disease is present. The lung volumes are low. The visualized soft tissues and bony thorax are unremarkable. IMPRESSION: 1. No acute abnormality or significant interval change. 2. Stable left lateral lung mass. 3. The Port-A-Cath is stable in position. Electronically Signed   By: San Morelle M.D.   On: 07/14/2015 16:01     STUDIES:  CXR 12/13 > No acute process.  Stable left lateral lung mass.  CULTURES: Blood 12/13 > Urine 12/13  >  ANTIBIOTICS: None.  SIGNIFICANT EVENTS: 12/13 > admitted with bone pain secondary to significant hypercalcemia.  LINES/TUBES: None.  DISCUSSION: Mr. Broz is a 44 y.o. M with PMH of refractory kappa light chain multiple myeloma s/p autologous stem cell transplant, and has failed salvage chemotherapy.  He is followed by Dr. Marin Olp and on his last admission 06/24/15, decision was made to change code status to FULL DNR given his advanced disease.  He has had multiple episodes of hypercalcemia and on 12/13, he was admitted once again with bone pain secondary to significant hypercalcemia (> 15).  ASSESSMENT / PLAN:  HEMATOLOGIC / ONCOLOGIC A:   Refractory kappa light chain myeloma s/p autologous stem cell transplant and has failed salvage chemotherapy - followed by Dr. Marin Olp. Pancytopenia - due to chemotherapy. VTE Prophylaxis. P:  Day team to please notify Dr. Marin Olp of admission. Transfuse for Hgb < 7. Monitor platelet counts. SCD's only. CBC in AM.  RENAL A:   Significant hypercalcemia. CKD. P:   NS @ 150. Calcitonin q12hrs. Consider repeat zoledronic acid in 1 week (received 22m in ED). Repeat BMP tonight and again in AM.  NEUROLOGIC A:   Bone pain - secondary to significant hypercalcemia from his underlying multiple myeloma. P:   Continue fentanyl patch, ativan, cyclobenzaprine, gabapentin. PRN fentanyl in lieu of outpatient morphine. Would recommend palliative care consult for goals of care.  PULMONARY A: Stable left lung mass. DNI STATUS. P:   CXR in AM.  CARDIOVASCULAR A:  Sinus tachycardia. DNR STATUS. P:  IVF resuscitation. Assess TSH. Trend lactate.  GASTROINTESTINAL A:   Nausea - due to underlying multiple myeloma and chemotherapy. GI prophylaxis. Nutrition. P:   Continue ondansetron, prochlorperazine. SUP: Pantoprazole. NPO.  INFECTIOUS A:   No indication of infection. P:   Assess PCT for completeness - if high then consider  empiric abx.  ENDOCRINE A:   No acute process.   P:   Monitor glucose on BMP.   Family updated: Wife at bedside.  Interdisciplinary Family Meeting v Palliative Care Meeting:  Due by: 12/19.   RMontey Hora PBendPulmonary & Critical Care Medicine Pager: [phone] or ([phone]/13/2016, 8:56 PM   Attending Note:  I have examined patient, reviewed labs, studies and notes. I have discussed the case with RJunius Roads and I agree with the data and plans as amended above. Pt with MM refractory to treatment, presents with severe hypercalcemia. He has received calcitonin and ZA in the ED. No significant exam findings besides tachycardia. He is complaining of leg pain. We will continue IVF, admit to SDU to follow his labs. He has responded to calcitonin in the past,  hopefully will do so today as well. Independent CC time 35 minutes.   Baltazar Apo, MD, PhD 07/14/2015, 9:58 PM [facility] [phone] or if no answer [phone]

## 2015-07-14 NOTE — ED Notes (Signed)
Critical lactic 3.0 per Amy in lab. RN made aware of same.

## 2015-07-14 NOTE — ED Notes (Addendum)
Pt currently being treated for multiple myeloma. Last chemo on 12/9. Was to get radiation, but started vomiting and sent to ED for elevated calcium level and fatigue. Pt had fever on Friday. Decreased PO intake per wife.

## 2015-07-14 NOTE — Patient Instructions (Signed)

## 2015-07-14 NOTE — ED Notes (Addendum)
Calcium > 15 Called by Bill in lab RN aware.

## 2015-07-14 NOTE — ED Notes (Signed)
Notified edp and nurse results from istat lactic acid 

## 2015-07-14 NOTE — Progress Notes (Signed)
ANTIBIOTIC CONSULT NOTE - INITIAL  Pharmacy Consult for Vancomycin & Zosyn Indication: Sepsis  No Known Allergies  Patient Measurements:     Vital Signs: Temp: 100.4 F (38 C) (12/13 1518) Temp Source: Rectal (12/13 1518) BP: 131/87 mmHg (12/13 1500) Pulse Rate: 132 (12/13 1500) Intake/Output from previous day:   Intake/Output from this shift:    Labs:  Recent Labs  07/13/15 1027 07/13/15 1027 07/14/15 1250 07/14/15 1250  WBC 5.9  --   --  4.2  HGB 10.1*  --   --  10.4*  PLT <6*  --   --  37*  CREATININE  --  1.2 1.4*  --    Estimated Creatinine Clearance: 72.5 mL/min (by C-G formula based on Cr of 1.4). No results for input(s): VANCOTROUGH, VANCOPEAK, VANCORANDOM, GENTTROUGH, GENTPEAK, GENTRANDOM, TOBRATROUGH, TOBRAPEAK, TOBRARND, AMIKACINPEAK, AMIKACINTROU, AMIKACIN in the last 72 hours.   Microbiology: Recent Results (from the past 720 hour(s))  Culture, blood (routine x 2)     Status: None   Collection Time: 06/24/15  5:24 PM  Result Value Ref Range Status   Specimen Description BLOOD PORTA CATH  Final   Special Requests BOTTLES DRAWN AEROBIC AND ANAEROBIC 5 ML  Final   Culture  Setup Time   Final    GRAM POSITIVE COCCI IN CLUSTERS IN BOTH AEROBIC AND ANAEROBIC BOTTLES CRITICAL RESULT CALLED TO, READ BACK BY AND VERIFIED WITH: D ALAVER 06/25/15 @ 1207 M VESTAL    Culture   Final    STAPHYLOCOCCUS SPECIES (COAGULASE NEGATIVE) THE SIGNIFICANCE OF ISOLATING THIS ORGANISM FROM A SINGLE SET OF BLOOD CULTURES WHEN MULTIPLE SETS ARE DRAWN IS UNCERTAIN. PLEASE NOTIFY THE MICROBIOLOGY DEPARTMENT WITHIN ONE WEEK IF SPECIATION AND SENSITIVITIES ARE REQUIRED. Performed at [facility]    Report Status 06/27/2015 FINAL  Final  Culture, blood (routine x 2)     Status: None   Collection Time: 06/24/15  6:15 PM  Result Value Ref Range Status   Specimen Description BLOOD RIGHT ANTECUBITAL  Final   Special Requests BOTTLES DRAWN AEROBIC AND ANAEROBIC 5 ML  Final    Culture   Final    NO GROWTH 5 DAYS Performed at [facility]    Report Status 06/29/2015 FINAL  Final  Urine culture     Status: None   Collection Time: 06/25/15 12:05 AM  Result Value Ref Range Status   Specimen Description URINE, RANDOM  Final   Special Requests NONE  Final   Culture   Final    NO GROWTH 1 DAY Performed at [facility]    Report Status 06/26/2015 FINAL  Final  Culture, blood (routine x 2)     Status: None   Collection Time: 06/28/15 11:41 AM  Result Value Ref Range Status   Specimen Description BLOOD RIGHT ANTECUBITAL  Final   Special Requests BOTTLES DRAWN AEROBIC AND ANAEROBIC 10CC  Final   Culture   Final    NO GROWTH 5 DAYS Performed at [facility]    Report Status 07/03/2015 FINAL  Final  Culture, blood (routine x 2)     Status: None   Collection Time: 06/28/15 11:51 AM  Result Value Ref Range Status   Specimen Description BLOOD LEFT ANTECUBITAL  Final   Special Requests BOTTLES DRAWN AEROBIC AND ANAEROBIC 10CC  Final   Culture   Final    NO GROWTH 5 DAYS Performed at [facility]    Report Status 07/03/2015 FINAL  Final  Medical History: Past Medical History  Diagnosis Date  . History of radiation therapy 02/07/13- 02/26/13    lower L spine, upper sacrum, 35 gray in 14 fractions  . FH: chemotherapy     Dr. Burney Gauze  . Cancer ([place])   . Multiple myeloma ([place]) dx'd 01/2013  . GERD (gastroesophageal reflux disease)   . Radiation 01/01/2015-01/14/2015    lower lumbar spine/upper sacrum area20 gray     Medications:  Scheduled:   Infusions:  . piperacillin-tazobactam    . sodium chloride    . vancomycin     Assessment:  44 yr male with h/o multiple myeloma, undergoing chemotherapy and radiation   Patient presents to ED due to vomiting, hypercalcemia, fatigue.  Patient febrile with increased heart rate.  Code Sepsis called and pharmacy requested to dose Vancomycin and Zosyn  Vancomycin 1gm and Zosyn  3.375gm IV x 1 dose each already ordered to be given in the ED  CrCl (n) = 69 ml/min  12/13 >>Vanc >> 12/13 >>Zosyn >>    12/13 blood: 12/13 urine:  Trough/Dose change info:   Goal of Therapy:  Vancomycin trough level 15-20 mcg/ml  Plan:  Measure antibiotic drug levels at steady state Follow up culture results  Zosyn 3.375gm IV q8h (each dose infused over 4 hrs) Vancomycin 1gm IV q12h  Odell Choung, Toribio Harbour, PharmD 07/14/2015,3:35 PM

## 2015-07-15 ENCOUNTER — Inpatient Hospital Stay (HOSPITAL_COMMUNITY): Payer: BLUE CROSS/BLUE SHIELD

## 2015-07-15 ENCOUNTER — Ambulatory Visit: Payer: BLUE CROSS/BLUE SHIELD

## 2015-07-15 DIAGNOSIS — C9000 Multiple myeloma not having achieved remission: Secondary | ICD-10-CM

## 2015-07-15 DIAGNOSIS — G9340 Encephalopathy, unspecified: Secondary | ICD-10-CM

## 2015-07-15 LAB — LACTIC ACID, PLASMA
LACTIC ACID, VENOUS: 2.3 mmol/L — AB (ref 0.5–2.0)
LACTIC ACID, VENOUS: 1.9 mmol/L (ref 0.5–2.0)

## 2015-07-15 LAB — CBC
HCT: 25.5 % — ABNORMAL LOW (ref 39.0–52.0)
HEMOGLOBIN: 8.2 g/dL — AB (ref 13.0–17.0)
MCH: 27.6 pg (ref 26.0–34.0)
MCHC: 32.2 g/dL (ref 30.0–36.0)
MCV: 85.9 fL (ref 78.0–100.0)
PLATELETS: 22 10*3/uL — AB (ref 150–400)
RBC: 2.97 MIL/uL — ABNORMAL LOW (ref 4.22–5.81)
RDW: 16.9 % — AB (ref 11.5–15.5)
WBC: 2.8 10*3/uL — AB (ref 4.0–10.5)

## 2015-07-15 LAB — GLUCOSE, CAPILLARY
GLUCOSE-CAPILLARY: 117 mg/dL — AB (ref 65–99)
Glucose-Capillary: 143 mg/dL — ABNORMAL HIGH (ref 65–99)
Glucose-Capillary: 124 mg/dL — ABNORMAL HIGH (ref 65–99)

## 2015-07-15 LAB — BASIC METABOLIC PANEL
Anion gap: 6 (ref 5–15)
BUN: 19 mg/dL (ref 6–20)
CALCIUM: 13.6 mg/dL — AB (ref 8.9–10.3)
CO2: 25 mmol/L (ref 22–32)
CREATININE: 1.21 mg/dL (ref 0.61–1.24)
Chloride: 111 mmol/L (ref 101–111)
GFR calc non Af Amer: >60 mL/min (ref >60)
Glucose, Bld: 177 mg/dL — ABNORMAL HIGH (ref 65–99)
Potassium: 3.9 mmol/L (ref 3.5–5.1)
SODIUM: 142 mmol/L (ref 135–145)

## 2015-07-15 LAB — PHOSPHORUS: PHOSPHORUS: 1.8 mg/dL — AB (ref 2.5–4.6)

## 2015-07-15 LAB — MAGNESIUM: Magnesium: 1.2 mg/dL — ABNORMAL LOW (ref 1.7–2.4)

## 2015-07-15 LAB — TSH: TSH: 1.873 u[IU]/mL (ref 0.350–4.500)

## 2015-07-15 LAB — MRSA PCR SCREENING: MRSA by PCR: NEGATIVE

## 2015-07-15 MED ORDER — MAGNESIUM SULFATE 2 GM/50ML IV SOLN
2.0000 g | Freq: Once | INTRAVENOUS | Status: AC
  Administered 2015-07-15: 2 g via INTRAVENOUS
  Filled 2015-07-15: qty 50

## 2015-07-15 MED ORDER — POTASSIUM PHOSPHATES 15 MMOLE/5ML IV SOLN
20.0000 meq | Freq: Once | INTRAVENOUS | Status: AC
  Administered 2015-07-15: 20 meq via INTRAVENOUS
  Filled 2015-07-15: qty 4.55

## 2015-07-15 MED ORDER — CALCITONIN (SALMON) 200 UNIT/ML IJ SOLN
640.0000 [IU] | Freq: Three times a day (TID) | INTRAMUSCULAR | Status: DC
  Administered 2015-07-15 – 2015-07-17 (×6): 640 [IU] via INTRAMUSCULAR
  Filled 2015-07-15 (×9): qty 3.2

## 2015-07-15 MED ORDER — GABAPENTIN 600 MG PO TABS
600.0000 mg | ORAL_TABLET | Freq: Three times a day (TID) | ORAL | Status: DC
  Administered 2015-07-15 – 2015-07-17 (×4): 600 mg via ORAL
  Filled 2015-07-15 (×9): qty 1

## 2015-07-15 MED ORDER — FILGRASTIM 480 MCG/1.6ML IJ SOLN
480.0000 ug | Freq: Once | INTRAMUSCULAR | Status: AC
  Administered 2015-07-15: 480 ug via SUBCUTANEOUS
  Filled 2015-07-15: qty 1.6

## 2015-07-15 NOTE — Progress Notes (Signed)
CRITICAL VALUE ALERT  Critical value received:  Platelets 28  Date of notification:  07/15/2015   Time of notification:  6:12 AM   Critical value read back:Yes.    Nurse who received alert:  Reap, Jon Gills   MD notified (1st page):  TRH  Time of first page:  6:12 AM   MD notified (2nd page):  Time of second page:  Responding MD:    Time MD responded:

## 2015-07-15 NOTE — Progress Notes (Signed)
CRITICAL VALUE ALERT  Critical value received:  Ca 13.6   Date of notification:  07/15/2015   Time of notification:  0500  Critical value read back:Yes.    Nurse who received alert:  Reap, Jon Gills   MD notified (1st page):  TRH  Time of first page:  5:01 AM   MD notified (2nd page):  Time of second page:  Responding MD:    Time MD responded:

## 2015-07-15 NOTE — Progress Notes (Signed)
PULMONARY / CRITICAL CARE MEDICINE   Name: Mark [name] MRN: [id number] DOB: Mar 16, 1971    ADMISSION DATE:  07/14/2015 CONSULTATION DATE:  07/14/15  REFERRING MD:  EDP  CHIEF COMPLAINT:  Pain  HISTORY OF PRESENT ILLNESS:  BRIEF Hx:  44yo male with hx refractory light chain myeloma.  Admitted with recurrent hypercalcemia 12/13.   SUBJECTIVE: Complains of confusion, weakness.  Denies dyspnea, chest pain. Bone pain improved.   VITAL SIGNS: BP 113/76 mmHg  Pulse 124  Temp(Src) 97.7 F (36.5 C) (Oral)  Resp 12  Ht _0  (1.753 m)  Wt 178 lb 9.2 oz (81 kg)  BMI 26.36 kg/m2  SpO2 99%  HEMODYNAMICS:    VENTILATOR SETTINGS:    INTAKE / OUTPUT: I/O last 3 completed shifts: In: 4317.5 [I.V.:4317.5] Out: 475 [Urine:475]   PHYSICAL EXAMINATION: General: pleasant young male, in NAD. Neuro: A&O x 3, non-focal, slow to respond at times  HEENT: Mark [name]/AT. PERRL, mm moist, pale Cardiovascular: Tachy, regular, no M/R/G. R chest PAC c/d  Lungs: Respirations even and unlabored on RA.  CTA bilaterally, No W/R/R. Abdomen: BS x 4, soft, NT/ND.  Musculoskeletal: No gross deformities, no edema.  Skin: Intact, warm, no rashes.  LABS:  BMET  Recent Labs Lab 07/14/15 1505 07/14/15 2218 07/15/15 0410  NA 137 142 142  K 3.8 4.1 3.9  CL 99* 109 111  CO2 _1 BUN 27* 24* 19  CREATININE 1.32* 1.27* 1.21  GLUCOSE 140* 163* 177*    Electrolytes  Recent Labs Lab 07/14/15 1505 07/14/15 2218 07/15/15 0410  CALCIUM >15.0* 14.6* 13.6*  MG  --   --  1.2*  PHOS  --   --  1.8*    CBC  Recent Labs Lab 07/14/15 1250 07/14/15 1505 07/15/15 0410  WBC 4.2 4.6 2.7*  HGB 10.4* 10.1* 8.0*  HCT 31.1* 30.4* 24.5*  PLT 37* 39* 28*    Coag's No results for input(s): APTT, INR in the last 168 hours.  Sepsis Markers  Recent Labs Lab 07/14/15 2218 07/15/15 0105 07/15/15 0410  LATICACIDVEN 3.0* 2.3* 1.9  PROCALCITON 1.00  --   --     ABG No results for input(s):  PHART, PCO2ART, PO2ART in the last 168 hours.  Liver Enzymes  Recent Labs Lab 07/13/15 1027 07/14/15 1250 07/14/15 1505  AST 33 28 38  ALT _2 ALKPHOS 74 86 85  BILITOT 1.30 0.92 1.3*  ALBUMIN 2.9* 3.1* 3.5    Cardiac Enzymes No results for input(s): TROPONINI, PROBNP in the last 168 hours.  Glucose  Recent Labs Lab 07/15/15 0831 07/15/15 1254  GLUCAP 143* 117*    Imaging Dg Chest Port 1 View  07/15/2015  CLINICAL DATA:  Respiratory failure shortness of breath, multiple myeloma EXAM: PORTABLE CHEST 1 VIEW COMPARISON:  Portable chest x-ray of July 14, 2015 FINDINGS: The lungs are reasonably well inflated. There is a stable pleural based mass in the left lower hemi thorax. The heart is top-normal in size. The pulmonary vascularity is normal. The power port appliance tip projects at junction of the SVC with the right atrium. The bony thorax exhibits no acute abnormality. There are degenerative changes of the left AC joint. IMPRESSION: Stable appearance of the chest since yesterday's study. Persistent pleural based mass in the left lower hemi thorax. Electronically Signed   By: David  Martinique M.D.   On: 07/15/2015 07:05   Dg Chest Port 1 View  07/14/2015  CLINICAL DATA:  Multiple myeloma. Hypercalcemia.  Fatigue. Decreased p.o. intake. EXAM: PORTABLE CHEST - 1 VIEW COMPARISON:  One-view chest 06/24/2015 FINDINGS: The heart size is normal. The lung volumes are low. A right IJ Port-A-Cath is stable in position. A left lateral lung mass is stable. No other focal airspace disease is present. The lung volumes are low. The visualized soft tissues and bony thorax are unremarkable. IMPRESSION: 1. No acute abnormality or significant interval change. 2. Stable left lateral lung mass. 3. The Port-A-Cath is stable in position. Electronically Signed   By: San Morelle M.D.   On: 07/14/2015 16:01     STUDIES:  CXR 12/13 > No acute process.  Stable left lateral lung  mass.  CULTURES: Blood 12/13 > Urine 12/13 >  ANTIBIOTICS: None.  SIGNIFICANT EVENTS: 12/13 > admitted with bone pain secondary to significant hypercalcemia.  LINES/TUBES: None.  DISCUSSION: Mark [name] is a 44 y.o. M with PMH of refractory lamda light chain multiple myeloma s/p autologous stem cell transplant, and has failed salvage chemotherapy.  He is followed by Dr. Marin Olp and on his last admission 06/24/15, decision was made to change code status to FULL DNR given his advanced disease.  He has had multiple episodes of hypercalcemia and on 12/13, he was admitted once again with bone pain secondary to significant hypercalcemia (> 15).  ASSESSMENT / PLAN:  HEMATOLOGIC / ONCOLOGIC A:   Refractory light chain myeloma s/p autologous stem cell transplant and has failed salvage chemotherapy - followed by Dr. Marin Olp. Pancytopenia - due to chemotherapy. VTE Prophylaxis. P:  D/w Dr Marin Olp, he will see pt  Neupogen per Dr. Antonieta Pert recs  Repeat CBC this pm  Transfuse for Hgb < 7. Monitor platelet counts. SCD's only. CBC in AM.  RENAL A:   Significant hypercalcemia. CKD. Hypophosphatemia  hypoMg  P:   Continue gentle volume  Replete Mg, Phos  Calcitonin q8hrs -- increased dose per Dr. Antonieta Pert recs to 8units/kg q8 hrs  Consider repeat zoledronic acid in 1 week (received 58m in ED). Repeat BMP tonight and again in AM.  NEUROLOGIC A:   Bone pain - secondary to significant hypercalcemia from his underlying multiple myeloma. P:   Continue fentanyl patch, ativan, cyclobenzaprine, gabapentin. PRN fentanyl in lieu of outpatient morphine. Would recommend palliative care consult for goals of care.  PULMONARY A: Stable left lung mass. DNI STATUS. P:   Intermittent f/u CXR   CARDIOVASCULAR A:  Sinus tachycardia. DNR STATUS. P:  Gentle IVF resuscitation. Correct electrolyte disturbances   GASTROINTESTINAL A:   Nausea - due to underlying multiple myeloma and  chemotherapy. GI prophylaxis. Nutrition. P:   Continue ondansetron, prochlorperazine. SUP: Pantoprazole. NPO.  INFECTIOUS A:   No indication of infection. P:   Assess PCT for completeness - if high then consider empiric abx.  ENDOCRINE A:   No acute process.   P:   Monitor glucose on BMP.   Family updated: Wife went home to rest, she will call at later time for update.   Interdisciplinary Family Meeting v Palliative Care Meeting:  Due by: 12/19.   Will ask [facility] to assume care in am 12/15.    KNickolas Madrid NP 07/15/2015  1:58 PM Pager: [phone] ([phone] Attending Note:  44year old male with MM who presents to the hospital with pain due to severe hypercalcemia.  Admitted to SDU and treated.  I reviewed CXR myself no evidence of acute disease.  Discussed with PCCM-NP and TRH-MD.  On exam, patient is confused but following  command.  Confusion: due to hypercalcemia.  - Treatment as below.  - Avoid sedating medications.  Hypercalcemia due to MM.  - Aldronate.  - Calcitonin.  - F/U labs.  Hypomag:  - Replace.  MM:  - H/O consult called.  - Steroids.  - Further recommendations per H/o.  Transfer to [facility] with PCCM off 12/15.  Patient seen and examined, agree with above note.  I dictated the care and orders written for this patient under my direction.  Rush Farmer, MD [phone]

## 2015-07-15 NOTE — Progress Notes (Signed)
CRITICAL VALUE ALERT  Critical value received:  Lactic Acid 2.3  Date of notification:  07/15/2015   Time of notification:  0207  Critical value read back:Yes.    Nurse who received alert:  Reap, Jon Gills   MD notified (1st page):  TRH  Time of first page:  [facility]  MD notified (2nd page):  Time of second page:  Responding MD:    Time MD responded:

## 2015-07-16 ENCOUNTER — Ambulatory Visit
Admit: 2015-07-16 | Discharge: 2015-07-16 | Disposition: A | Discharge status: Home / Self Care | Payer: BLUE CROSS/BLUE SHIELD | Attending: Radiation Oncology | Admitting: Radiation Oncology

## 2015-07-16 ENCOUNTER — Other Ambulatory Visit: Payer: BLUE CROSS/BLUE SHIELD

## 2015-07-16 ENCOUNTER — Ambulatory Visit: Payer: BLUE CROSS/BLUE SHIELD | Admitting: Family

## 2015-07-16 ENCOUNTER — Ambulatory Visit: Payer: BLUE CROSS/BLUE SHIELD

## 2015-07-16 ENCOUNTER — Ambulatory Visit: Payer: BLUE CROSS/BLUE SHIELD | Admitting: Hematology & Oncology

## 2015-07-16 ENCOUNTER — Encounter: Payer: Self-pay | Admitting: Hematology & Oncology

## 2015-07-16 DIAGNOSIS — M899 Disorder of bone, unspecified: Secondary | ICD-10-CM

## 2015-07-16 DIAGNOSIS — N189 Chronic kidney disease, unspecified: Secondary | ICD-10-CM

## 2015-07-16 DIAGNOSIS — I272 Other secondary pulmonary hypertension: Secondary | ICD-10-CM

## 2015-07-16 DIAGNOSIS — J984 Other disorders of lung: Secondary | ICD-10-CM

## 2015-07-16 DIAGNOSIS — N179 Acute kidney failure, unspecified: Secondary | ICD-10-CM | POA: Diagnosis present

## 2015-07-16 DIAGNOSIS — M898X9 Other specified disorders of bone, unspecified site: Secondary | ICD-10-CM | POA: Diagnosis present

## 2015-07-16 DIAGNOSIS — R918 Other nonspecific abnormal finding of lung field: Secondary | ICD-10-CM | POA: Diagnosis present

## 2015-07-16 DIAGNOSIS — R000 Tachycardia, unspecified: Secondary | ICD-10-CM | POA: Diagnosis present

## 2015-07-16 DIAGNOSIS — C9002 Multiple myeloma in relapse: Secondary | ICD-10-CM | POA: Diagnosis present

## 2015-07-16 LAB — COMPREHENSIVE METABOLIC PANEL
ALBUMIN: 2.5 g/dL — AB (ref 3.5–5.0)
ALK PHOS: 65 U/L (ref 38–126)
ALT: 16 U/L — ABNORMAL LOW (ref 17–63)
ANION GAP: 5 (ref 5–15)
AST: 30 U/L (ref 15–41)
BUN: 17 mg/dL (ref 6–20)
CALCIUM: 13.6 mg/dL — AB (ref 8.9–10.3)
CO2: 27 mmol/L (ref 22–32)
Chloride: 113 mmol/L — ABNORMAL HIGH (ref 101–111)
Creatinine, Ser: 1.3 mg/dL — ABNORMAL HIGH (ref 0.61–1.24)
GFR calc non Af Amer: >60 mL/min (ref >60)
Glucose, Bld: 123 mg/dL — ABNORMAL HIGH (ref 65–99)
Potassium: 4.1 mmol/L (ref 3.5–5.1)
Sodium: 145 mmol/L (ref 135–145)
Total Bilirubin: 1.1 mg/dL (ref 0.3–1.2)
Total Protein: 5.5 g/dL — ABNORMAL LOW (ref 6.5–8.1)

## 2015-07-16 LAB — BASIC METABOLIC PANEL
Anion gap: 4 — ABNORMAL LOW (ref 5–15)
BUN: 18 mg/dL (ref 6–20)
CHLORIDE: 117 mmol/L — AB (ref 101–111)
CO2: 27 mmol/L (ref 22–32)
Calcium: 13.9 mg/dL (ref 8.9–10.3)
Creatinine, Ser: 1.36 mg/dL — ABNORMAL HIGH (ref 0.61–1.24)
GFR calc non Af Amer: >60 mL/min (ref >60)
Glucose, Bld: 123 mg/dL — ABNORMAL HIGH (ref 65–99)
POTASSIUM: 4.4 mmol/L (ref 3.5–5.1)
SODIUM: 148 mmol/L — AB (ref 135–145)

## 2015-07-16 LAB — URINE CULTURE: Culture: 8000

## 2015-07-16 LAB — CBC
HCT: 24.5 % — ABNORMAL LOW (ref 39.0–52.0)
Hemoglobin: 8 g/dL — ABNORMAL LOW (ref 13.0–17.0)
MCH: 27.6 pg (ref 26.0–34.0)
MCHC: 32.7 g/dL (ref 30.0–36.0)
MCV: 84.5 fL (ref 78.0–100.0)
PLATELETS: 28 10*3/uL — AB (ref 150–400)
RBC: 2.9 MIL/uL — AB (ref 4.22–5.81)
RDW: 16.9 % — AB (ref 11.5–15.5)
WBC: 2.7 10*3/uL — AB (ref 4.0–10.5)

## 2015-07-16 NOTE — Consult Note (Signed)
Referral MD  Reason for Referral: refractory myeloma/hypercalcemia  Chief Complaint  Patient presents with  . Hypercalcemia   : The patient cannot give any history  HPI: Mr. Beber is well-known to me. He is a very nice but unfortunate 44 year old African-American male with refractory kappa light chain myeloma. He has been through some cell transplantation. He relapsed within a year after this. He's been on salvage therapy in hopes of getting an allogeneic transplant. Unfortunately, his disease has not responded to any salvage regimens.  He has been admitted frequently because of rapidly progressive hypercalcemia. He was seen in radiation oncology 2 days ago. He is getting radiation therapy to his lower cervical spine because of a pathologic fracture. His calcium was 16.5. He has been interoffice previously getting therapy to help with hypercalcemia. It is apparent that or interventions are no longer working.  He was admitted to the cardiac stepdown unit at [facility]. He is pancytopenic from myeloma involvement of his marrow. He is beginning calcitonin. We have given him Zometa in the office. He is on IV fluids. His calcium is coming down a little bit but his overall status is worsening.  I saw him this morning, he really cannot talk much. He is not eating. He not had any pain medication throughout the night. I think it is quite apparent that he is end stage and that his myeloma is rapidly progressing and possibly even invading into the CNS.  I had a very long talk with his wife. She understands that he just is not going to make it. She understands that any further therapy for him is when not going to improve his quality of life and that any improvement in his calcium will be short-lived.  I have talked to Mr. Litt in the past about CODE STATUS and he does not wish to be kept alive heroically as this would not affect his outcome.  As such, he is a DO NOT RESUSCITATE.    Past Medical History   Diagnosis Date  . History of radiation therapy 02/07/13- 02/26/13    lower L spine, upper sacrum, 35 gray in 14 fractions  . FH: chemotherapy     Dr. Burney Gauze  . Cancer ([place])   . Multiple myeloma ([place]) dx'd 01/2013  . GERD (gastroesophageal reflux disease)   . Radiation 01/01/2015-01/14/2015    lower lumbar spine/upper sacrum area20 gray   :  Past Surgical History  Procedure Laterality Date  . Portacath placement    . Bone marrow transplant  09/12/13    [facility]  . Kyphoplasty  05/20/14  . Lumbar laminectomy/decompression microdiscectomy N/A 09/25/2014    Procedure: LUMBAR LAMINECTOMY/DECOMPRESSION MICRODISCECTOMY;  Surgeon: Sinclair Ship, MD;  Location: [facility];  Service: Orthopedics;  Laterality: N/A;  Lumbar 4-5, L5-S1  decompression  . Esophagogastroduodenoscopy N/A 02/06/2015    Procedure: ESOPHAGOGASTRODUODENOSCOPY (EGD);  Surgeon: Teena Irani, MD;  Location: [facility];  Service: Endoscopy;  Laterality: N/A;  :   Current facility-administered medications:  .  0.9 %  sodium chloride infusion, , Intravenous, Continuous, [name], MD, Last Rate: 150 mL/hr at 07/16/15 0255 .  0.9 %  sodium chloride infusion, 250 mL, Intravenous, PRN, Rahul P Desai, PA-C .  bisacodyl (DULCOLAX) EC tablet 5 mg, 5 mg, Oral, Daily PRN, Rahul P Desai, PA-C .  calcitonin (MIACALCIN) injection 640 Units, 640 Units, Intramuscular, 3 times per day, Marijean Heath, NP, 640 Units at 07/16/15 0536 .  cyclobenzaprine (FLEXERIL) tablet 10 mg,  10 mg, Oral, TID PRN, Rahul P Desai, PA-C .  fentaNYL (DURAGESIC - dosed mcg/hr) 200 mcg, 200 mcg, Transdermal, Q48H, Rahul P Desai, PA-C, 200 mcg at 07/14/15 2214 .  fentaNYL (SUBLIMAZE) injection 25-50 mcg, 25-50 mcg, Intravenous, Q1H PRN, Rahul P Desai, PA-C .  gabapentin (NEURONTIN) tablet 600 mg, 600 mg, Oral, TID, Collene Gobble, MD, 600 mg at 07/15/15 2049 .  LORazepam (ATIVAN) tablet 0.5 mg, 0.5 mg, Oral, Q6H PRN, Rahul P Desai, PA-C, 0.5  mg at 07/15/15 2102 .  ondansetron (ZOFRAN-ODT) disintegrating tablet 8 mg, 8 mg, Oral, Q8H PRN, Rahul P Desai, PA-C, 8 mg at 07/15/15 2049 .  pantoprazole (PROTONIX) EC tablet 40 mg, 40 mg, Oral, Daily, Rahul P Desai, PA-C, 40 mg at 07/15/15 0918 .  prochlorperazine (COMPAZINE) tablet 10 mg, 10 mg, Oral, Q6H PRN, Rahul P Desai, PA-C:  . calcitonin  640 Units Intramuscular 3 times per day  . fentaNYL  200 mcg Transdermal Q48H  . gabapentin  600 mg Oral TID  . pantoprazole  40 mg Oral Daily  :  No Known Allergies:  Family History  Problem Relation Age of Onset  . Diabetic kidney disease Mother   . Hypertension Mother   . Heart attack Mother   . Kidney failure Mother   . Coronary artery disease Mother   . HIV Father   :  Social History   Social History  . Marital Status: Married    Spouse Name: N/A  . Number of Children: N/A  . Years of Education: N/A   Occupational History  . Not on file.   Social History Main Topics  . Smoking status: Former Smoker -- 1.00 packs/day for 15 years    Types: Cigarettes    Start date: 06/29/1988    Quit date: 06/29/2003  . Smokeless tobacco: Never Used     Comment: quit smoking 10 years ago  . Alcohol Use: No  . Drug Use: No  . Sexual Activity: No   Other Topics Concern  . Not on file   Social History Narrative  :  Pertinent items are noted in HPI.  Exam: Patient Vitals for the past 24 hrs:  BP Temp Temp src Pulse Resp SpO2 Weight  07/16/15 0604 121/71 mmHg 99.1 F (37.3 C) Oral - - - -  07/16/15 0449 117/67 mmHg - - - - - -  07/16/15 0054 - 98 F (36.7 C) Oral - - - -  07/16/15 0032 - 97.8 F (36.6 C) Oral - - - -  07/15/15 1947 125/82 mmHg 97.7 F (36.5 C) Oral (!) 129 16 96 % -  07/15/15 1702 110/72 mmHg 98.2 F (36.8 C) Oral (!) 121 11 98 % -  07/15/15 1245 113/76 mmHg 97.7 F (36.5 C) Oral (!) 124 12 99 % -  07/15/15 0831 121/80 mmHg 97.7 F (36.5 C) Oral (!) 123 16 90 % -    as above    Recent Labs   07/15/15 2038 07/16/15 0600  WBC 2.8* 3.9*  HGB 8.2* 7.9*  HCT 25.5* 24.9*  PLT 22* PENDING    Recent Labs  07/15/15 0410 07/16/15 0600  NA 142 145  K 3.9 4.1  CL 111 113*  CO2 25 27  GLUCOSE 177* 123*  BUN 19 17  CREATININE 1.21 1.30*  CALCIUM 13.6* 13.6*    Blood smear review:  none   Pathology: None     Assessment and Plan:Mr. Brandt is a 44 year old African [place] male with refractory  Kappa Lightchain myeloma. He has recurrent and more recalcitrant hypercalcemia.  He looks much different this morning and will see him before. I really believe that this is going to be the final stage for him.  I think his prognosis is easily less than 1 week.  I talked to his wife at length. I told her that we really need to begin to adjust his care for comfort at this point. I think any further treatment for the hypercalcemia would not affect his outcome. He is in no condition to take any more chemotherapy. It is apparent that we've done so far has just not been effective.  I really think that we have to get him over to [facility]. I called them this morning. I think there is a bed opening. I just do not think that keeping him in the hospital is going to affect his outcome. I does want him to have respect and dignity. Getting him over to [facility] will definitely improve his quality of life for the short time that he has left. His wife is in agreement.  This is incredibly difficult for her. There is 81 year old son. He is very much aware of the fact that his father is not going to survive this. I just feel bad that this is happening during the holiday season.  Mr. Steenson has a very strong faith. His wife is very strong faith. He is not afraid to pass on because he knows where he is going he knows who he will be with.  He has been a strong inspiration for [place]. He is followed very hard. He is done everything we have asked him to do and then some. His disease has just been incredibly  aggressive and much more difficult to treat than anyone had anticipated.  Hopefully, he will be able to get over to [facility] today.  I appreciate everybody's help in trying to make his life better.  Lum Keas  2 Timothy 4:17-18

## 2015-07-16 NOTE — Progress Notes (Signed)
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 He relapsed within a year after this. He's been on salvage therapy in hopes of getting an allogeneic transplant. Unfortunately, his disease has not responded to any salvage regimens.  He has been admitted frequently because of rapidly progressive hypercalcemia. He was seen in radiation oncology 2 days ago. He is getting radiation therapy to his lower cervical spine because of a pathologic fracture. His calcium was 16.5. He has been interoffice previously getting therapy to help with hypercalcemia. It is apparent that or interventions are no longer working.  He was admitted to the cardiac stepdown unit at [facility]. He is pancytopenic from myeloma involvement of his marrow. He is beginning calcitonin. We have given him Zometa in the office. He is on IV fluids. His calcium is coming down a little bit but his overall status is worsening.  I saw him this morning, he really cannot talk much. He is not eating. He not had any pain medication throughout the night. I think it is quite apparent that he is end stage and that his myeloma is rapidly progressing and possibly even invading into the CNS.  I had a very long talk with his wife. She understands that he just is not going to make it. She understands that any further therapy for him is when not going to improve his quality of life and that any improvement in his calcium will be short-lived.  I have talked to Mark [name] in the past about CODE STATUS and he does not wish to be kept alive heroically as this would not affect his outcome  HPI/Subjective: 12/15 sleepy/lethargic arousable with painful stimuli A/O 0, mumbles  unintelligibly.  Assessment/Plan: Refractory light chain myeloma s/p autologous stem cell transplant and has failed salvage chemotherapy  -Per Dr. Antonieta Pert oncology note.Pancytopenia - due to chemotherapy. - followed by Dr. Marin Olp oncology note no further treatment options available. Feels that the best plan for patient would be to get him to [facility] today. Per note family is in agreement with plan -Neupogen per Dr. Antonieta Pert recs  -Transfuse for Hgb < 7. -Monitor platelet counts. Extremely low but no sign of overt bleeding  Sinus tachycardia/Pulmonary HTN -Gentle hydration  Left Lung mass -Stable  Acute on CKD. (Baseline Cr ~1) -Continue normal saline 39m/hr  Hypercalcemia. -Consistent with multiple myeloma -Calcitonin q8hrs -- increased dose per Dr. EAntonieta Pertrecs to 8units/kg q8 hrs  -Consider repeat zoledronic acid in 1 week (received 476min ED).  Hypophosphatemia  -Replete PRN  Hypomagnesemia   -Replete PRN  Bone pain - secondary to significant hypercalcemia from his underlying multiple myeloma. -Continue fentanyl patch 200 g -Continue Ativan 0.5 mg QID PRN . -Continue Flexeril 10 mgTID PRN -Continue Neurontin 600 mg TID     Code Status: DO NOT RESUSCITATE Family Communication: no family present at time of exam Disposition Plan: [facility]    Consultants: Dr. EnMarin Olpncology Dr.Wesam G Kathryne SharperC CM  Procedure/Significant Events: 11/28 echocardiogram;- Left ventricle: mild concentric hypertrophy. -LVEF= 60% to 65%.  -Pulmonary arteries: PA peak pressure: 50 mm Hg (S).   Culture NA  Antibiotics: NA  DVT prophylaxis: SCD's only.   Devices NA   LINES / TUBES:  NA    Continuous Infusions: .  sodium chloride 1,000 mL (07/16/15 1015)    Objective: VITAL SIGNS: Temp: 98.3 F (36.8 C) (12/15 1148) Temp Source: Axillary (12/15 1148) BP: 119/74 mmHg (12/15 1148) Pulse Rate: 128 (12/15 1148) SPO2; FIO2:   Intake/Output  Summary (Last 24 hours) at 07/16/15 1313 Last data filed at 07/16/15 0900  Gross per 24 hour  Intake   1184 ml  Output   2350 ml  Net  -1166 ml     Exam: General: sleepy/lethargic arousable with painful stimuli A/O 0, mumbles unintelligibly, No acute respiratory distress Eyes: Negative headache, eye pain, double vision,negative scleral hemorrhage ENT: Negative Runny nose, negative ear pain, negative gingival bleeding, Neck:  Negative scars, masses, torticollis, lymphadenopathy, JVD Lungs: Clear to auscultation bilaterally without wheezes or crackles Cardiovascular: Regular rate and rhythm without murmur gallop or rub normal S1 and S2 Abdomen:negative abdominal pain, nondistended, positive soft, bowel sounds, no rebound, no ascites, no appreciable mass Extremities: No significant cyanosis, clubbing, or edema bilateral lower extremities Psychiatric:  Negative depression, negative anxiety, negative fatigue, negative mania  Neurologic:  Unable to assess secondary to patient's altered mental status   Data Reviewed: Basic Metabolic Panel:  Recent Labs Lab 07/13/15 1027 07/14/15 1250 07/14/15 1505 07/14/15 2218 07/15/15 0410 07/16/15 0600  NA 136 138 137 142 142 145  K 4.1 4.2 3.8 4.1 3.9 4.1  CL 96*  --  99* 109 111 113*  CO2 '26 27 28 26 25 27  ' GLUCOSE 231* 170* 140* 163* 177* 123*  BUN 23* 25.0 27* 24* 19 17  CREATININE 1.2 1.4* 1.32* 1.27* 1.21 1.30*  CALCIUM 15.7* 16.5* >15.0* 14.6* 13.6* 13.6*  MG  --   --   --   --  1.2*  --   PHOS  --   --   --   --  1.8*  --    Liver Function Tests:  Recent Labs Lab 07/10/15 1433 07/13/15 1027 07/14/15 1250 07/14/15 1505 07/16/15 0600  AST 33 33 28 38 30  ALT '25 23 20 23 ' 16*  ALKPHOS 60 74 86 85 65  BILITOT 1.00 1.30 0.92 1.3* 1.1  PROT 6.0* 6.6 6.9 7.1 5.5*  ALBUMIN 3.0* 2.9* 3.1* 3.5 2.5*    Recent Labs Lab 07/14/15 1505  LIPASE 19   No results for input(s): AMMONIA in the last 168 hours. CBC:  Recent Labs Lab  07/10/15 1433 07/13/15 1027 07/14/15 1250 07/14/15 1505 07/15/15 0410 07/15/15 2038 07/16/15 0600  WBC 4.0 5.9 4.2 4.6 2.7* 2.8* 3.9*  NEUTROABS 2.4 4.0 2.6  --   --   --   --   HGB 9.5* 10.1* 10.4* 10.1* 8.0* 8.2* 7.9*  HCT 28.5* 30.8* 31.1* 30.4* 24.5* 25.5* 24.9*  MCV 85 84 83.6 83.5 84.5 85.9 86.2  PLT 7 Platelet count confirmed by slide estimate* <6* 37* 39* 28* 22* 18*   Cardiac Enzymes: No results for input(s): CKTOTAL, CKMB, CKMBINDEX, TROPONINI in the last 168 hours. BNP (last 3 results) No results for input(s): BNP in the last 8760 hours.  ProBNP (last 3 results) No results for input(s): PROBNP in the last 8760 hours.  CBG:  Recent Labs Lab 07/15/15 0831 07/15/15 1254 07/15/15 1701  GLUCAP 143* 117* 124*    Recent Results (from the past 240 hour(s))  Blood Culture (routine x 2)     Status: None (Preliminary result)   Collection Time: 07/14/15  3:25 PM  Result Value Ref Range Status   Specimen Description BLOOD PORTA CATH  Final  Special Requests BOTTLES DRAWN AEROBIC AND ANAEROBIC 5 ML  Final   Culture   Final    NO GROWTH < 24 HOURS Performed at [facility]    Report Status PENDING  Incomplete  Blood Culture (routine x 2)     Status: None (Preliminary result)   Collection Time: 07/14/15  3:50 PM  Result Value Ref Range Status   Specimen Description BLOOD RIGHT ANTECUBITAL  Final   Special Requests BOTTLES DRAWN AEROBIC AND ANAEROBIC 5 CC EA  Final   Culture   Final    NO GROWTH < 24 HOURS Performed at [facility]    Report Status PENDING  Incomplete  Urine culture     Status: None   Collection Time: 07/14/15  4:33 PM  Result Value Ref Range Status   Specimen Description URINE, RANDOM  Final   Special Requests NONE  Final   Culture   Final    8,000 COLONIES/mL INSIGNIFICANT GROWTH Performed at [facility]    Report Status 07/16/2015 FINAL  Final  MRSA PCR Screening     Status: None   Collection Time: 07/15/15  3:09 AM   Result Value Ref Range Status   MRSA by PCR NEGATIVE NEGATIVE Final    Comment:        The GeneXpert MRSA Assay (FDA approved for NASAL specimens only), is one component of a comprehensive MRSA colonization surveillance program. It is not intended to diagnose MRSA infection nor to guide or monitor treatment for MRSA infections.      Studies:  Recent x-ray studies have been reviewed in detail by the Attending Physician  Scheduled Meds:  Scheduled Meds: . calcitonin  640 Units Intramuscular 3 times per day  . fentaNYL  200 mcg Transdermal Q48H  . gabapentin  600 mg Oral TID  . pantoprazole  40 mg Oral Daily    Time spent on care of this patient: 40 mins   Aranza Geddes, Geraldo Docker , MD  [facility] Office  [phone] Pager [phone]  On-Call/Text Page:      [email or web address]      password [email or web address]  If 7PM-7AM, please contact night-coverage [email or web address] Password [email or web address] 07/16/2015, 1:13 PM   LOS: 2 days   Care during the described time interval was provided by me .  I have reviewed this patient's available data, including medical history, events of note, physical examination, and all test results as part of my evaluation. I have personally reviewed and interpreted all radiology studies.   Dia Crawford, MD [phone] Pager

## 2015-07-16 NOTE — Consult Note (Addendum)
[place] Liaison: Received request from [place] for family interest in [facility]. Also, Dr. Marin Olp called [facility] this morning. Chart reviewed. Unfortunately no room available today. [facility] can offer next available and will update CSW when room becomes available. Will follow up with family later this afternoon. Dr. Marin Olp aware.   [facility] room is available for [name] tomorrow morning. Spouse is aware and agreeable. Plan to meet her in the room at 10:00 am to complete paper work. CSW Mark [name] aware.   RN please call report to [phone] before transfer.   Please fax DC summary fax [phone].  Thank you.  Mark [name], [place]

## 2015-07-16 NOTE — Progress Notes (Signed)
CRITICAL VALUE ALERT  Critical value received:  Calcium 13.9  Date of notification:  07/16/2015   Time of notification:  9:10 PM   Critical value read back:Yes.    Nurse who received alert:  Reap, Jon Gills   MD notified (1st page):  TRH  Time of first page:  9:11 PM   MD notified (2nd page):  Time of second page:  Responding MD:    Time MD responded:

## 2015-07-16 NOTE — Progress Notes (Addendum)
CRITICAL VALUE ALERT  Critical value received:  Ca 13.6  Date of notification:  07/16/15  Time of notification:  0651  Critical value read back: Yes  MD notified (1st page):  TRH  Time of first page:  [phone]  MD notified (2nd page):  Time of second page:  Responding MD:    Time MD responded:

## 2015-07-16 NOTE — Progress Notes (Signed)
CSW informed of oncologist referral to [facility].  CSW confirmed with primary MD that hospice was an appropriate plan.  CSW spoke with pt wife concerning hospice facility options- [facility] is her [facility] choice.  CSW made referral to [facility]- bed availability for today pending.  CSW will continue to follow  Domenica Reamer, [place] Social Worker [phone]

## 2015-07-17 ENCOUNTER — Ambulatory Visit: Payer: BLUE CROSS/BLUE SHIELD

## 2015-07-17 LAB — COMPREHENSIVE METABOLIC PANEL
ALBUMIN: 2.6 g/dL — AB (ref 3.5–5.0)
ALT: 15 U/L — ABNORMAL LOW (ref 17–63)
ANION GAP: 5 (ref 5–15)
AST: 36 U/L (ref 15–41)
Alkaline Phosphatase: 78 U/L (ref 38–126)
BILIRUBIN TOTAL: 1.3 mg/dL — AB (ref 0.3–1.2)
BUN: 18 mg/dL (ref 6–20)
CALCIUM: 14 mg/dL — AB (ref 8.9–10.3)
CO2: 27 mmol/L (ref 22–32)
Chloride: 115 mmol/L — ABNORMAL HIGH (ref 101–111)
Creatinine, Ser: 1.3 mg/dL — ABNORMAL HIGH (ref 0.61–1.24)
GLUCOSE: 127 mg/dL — AB (ref 65–99)
POTASSIUM: 4.2 mmol/L (ref 3.5–5.1)
Sodium: 147 mmol/L — ABNORMAL HIGH (ref 135–145)
TOTAL PROTEIN: 5.6 g/dL — AB (ref 6.5–8.1)

## 2015-07-17 LAB — PHOSPHORUS: PHOSPHORUS: 2.8 mg/dL (ref 2.5–4.6)

## 2015-07-17 LAB — CBC
HCT: 24.9 % — ABNORMAL LOW (ref 39.0–52.0)
HCT: 25.1 % — ABNORMAL LOW (ref 39.0–52.0)
HEMOGLOBIN: 7.9 g/dL — AB (ref 13.0–17.0)
Hemoglobin: 8.1 g/dL — ABNORMAL LOW (ref 13.0–17.0)
MCH: 27.3 pg (ref 26.0–34.0)
MCH: 28 pg (ref 26.0–34.0)
MCHC: 31.7 g/dL (ref 30.0–36.0)
MCHC: 32.3 g/dL (ref 30.0–36.0)
MCV: 86.2 fL (ref 78.0–100.0)
MCV: 86.9 fL (ref 78.0–100.0)
PLATELETS: 12 10*3/uL — AB (ref 150–400)
Platelets: 18 10*3/uL — CL (ref 150–400)
RBC: 2.89 MIL/uL — AB (ref 4.22–5.81)
RBC: 2.89 MIL/uL — AB (ref 4.22–5.81)
RDW: 17.1 % — ABNORMAL HIGH (ref 11.5–15.5)
RDW: 17.3 % — AB (ref 11.5–15.5)
WBC: 3.9 10*3/uL — ABNORMAL LOW (ref 4.0–10.5)
WBC: 5.7 10*3/uL (ref 4.0–10.5)

## 2015-07-17 LAB — MAGNESIUM: MAGNESIUM: 1.8 mg/dL (ref 1.7–2.4)

## 2015-07-17 MED ORDER — DEXTROSE 5 % IV SOLN: 75.0000 mL | INTRAVENOUS | Status: AC

## 2015-07-17 MED ORDER — FENTANYL CITRATE (PF) 100 MCG/2ML IJ SOLN: 25.0000 ug | INTRAMUSCULAR | Status: AC | PRN

## 2015-07-17 MED ORDER — DEXTROSE 5 % IV SOLN
INTRAVENOUS | Status: DC
  Administered 2015-07-17: 10:00:00 via INTRAVENOUS

## 2015-07-17 MED ORDER — CALCITONIN (SALMON) 200 UNIT/ML IJ SOLN: 640.0000 [IU] | Freq: Three times a day (TID) | INTRAMUSCULAR | Status: AC

## 2015-07-17 NOTE — Progress Notes (Signed)
[facility] spoke to Maudry Diego the receiving RN, she said it is okay to leave the porta cath accessed, porta cath was flushed ,VSS.

## 2015-07-17 NOTE — Discharge Summary (Signed)
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 Antonieta Pert oncology note.Pancytopenia - due to chemotherapy. - followed by Dr. Marin Olp oncology note no further treatment options available. Feels that the best plan for patient would be to get him to [facility] today. Per note family is in agreement with plan -Neupogen per Dr. Antonieta Pert recs  -Transfuse for Hgb < 7. -Monitor platelet counts. Extremely low but no sign of overt bleeding -Follow-up with Dr. Marin Olp Oncology 7-10 days for any additional comfort care measures for refractory light chain multiple myeloma  Sinus tachycardia/Pulmonary HTN -Gentle hydration; monitor for fluid overload -See hypernatremia  Left Lung mass -Stable  Acute on CKD. (Baseline Cr ~1) -Continue D5W at 75 ml/hr  Hypernatremia -Start D5W at sign 5 ml/hr  Hypercalcemia. -Consistent with multiple myeloma -Calcitonin q8hrs -- increased dose per Dr. Antonieta Pert recs to 8units/kg q8 hrs  -Consider repeat zoledronic acid in 1 week (received 30m in ED).  Hypophosphatemia  -Replete PRN  Hypomagnesemia  -Replete PRN  Bone pain - secondary to significant hypercalcemia from his underlying multiple myeloma. -Continue fentanyl patch 200 g -Continue Ativan 0.5 mg QID PRN . -Continue Flexeril 10 mgTID PRN -Continue Neurontin 600 mg TID   Discharge Diagnoses:  Active Problems:   Hypercalcemia of malignancy   Multiple myeloma in relapse ([facility]   Sinus tachycardia (HCC)   Pulmonary hypertension (HCC)   Mass of lower lobe of left lung   Acute on chronic renal failure (HCC)   Hypophosphatemia   Hypomagnesemia   Bone pain   Discharge Condition:  Guarded  Diet recommendation: Regular when when patient awake enough to safely consume meal.   Filed Weights   07/15/15 0415 07/16/15 1921 07/17/15 0500  Weight: 81 kg (178 lb 9.2 oz) 83.2 kg (183 lb 6.8 oz) 82.3 kg (181 lb 7 oz)    History of present illness:  44year old BM PMHx with refractory kappa light chain myeloma S/P autologous stem cell transplant, and has failed salvage chemotherapy. He relapsed within a year after this. He's been on salvage therapy in hopes of getting an allogeneic transplant. Unfortunately, his disease has not responded to any salvage regimens.  He has been admitted frequently because of rapidly progressive hypercalcemia. He was seen in radiation oncology 2 days ago. He is getting radiation therapy to his lower cervical spine because of a pathologic fracture. His calcium was 16.5. He has been interoffice previously getting therapy to help with hypercalcemia. It is apparent that or interventions are no longer working.  [facility] He is pancytopenic from myeloma involvement of his marrow. He is beginning calcitonin. We have given him Zometa in the office. He is on IV fluids. His calcium is coming down a little bit but his overall status is worsening.  I saw him this morning, he really cannot talk much. He is not eating. He not had any pain medication throughout the night. I think it is quite apparent that he is end stage and that his myeloma is rapidly progressing and possibly even invading into the CNS.  I had a very long talk with his wife. She understands that he just is not going to make it. She understands that any further therapy for  him is when not going to improve his quality of life and that any improvement in his calcium will be short-lived.  I have talked to [name] in the past about CODE STATUS and he does not wish to be kept alive heroically as this would not affect his outcome Currently patient A/O 2  (does not know where, why), having hallucinations (seeing people in the room that are not there).    Consultants: Dr. Marin Olp oncology Dr.Wesam Kathryne Sharper [facility] CM  Procedure/Significant Events: 11/28 echocardiogram;- Left ventricle: mild concentric hypertrophy. -LVEF= 60% to 65%.  -Pulmonary arteries: PA peak pressure: 50 mm Hg (S).   Culture NA  Antibiotics: NA  DVT prophylaxis: SCD's only.    Discharge Exam: Filed Vitals:   07/17/15 0001 07/17/15 0400 07/17/15 0500 07/17/15 0739  BP: 103/86 121/72  130/81  Pulse: 129 117  119  Temp: 98.2 F (36.8 C) 98.4 F (36.9 C)  97.8 F (36.6 C)  TempSrc: Oral Oral  Oral  Resp: _0 Height:      Weight:   82.3 kg (181 lb 7 oz)   SpO2: 99% 99%  100%    General:  A/O 2 (does not know where, why), having hallucinations (seeing people in the room that are not there). Negative acute respiratory distress. Eyes: Negative headache, eye pain, double vision,negative scleral hemorrhage ENT: Negative Runny nose, negative ear pain, negative gingival bleeding, Neck: Negative scars, masses, torticollis, lymphadenopathy, JVD Lungs: Clear to auscultation bilaterally without wheezes or crackles Cardiovascular: Tachycardic, Regular rhythm without murmur gallop or rub normal S1 and S2   Discharge Instructions     Medication List    ASK your doctor about these medications        bisacodyl 5 MG EC tablet  Commonly known as:  DULCOLAX  Take 5 mg by mouth daily as needed for moderate constipation (constipation).     cyclobenzaprine 10 MG tablet  Commonly known as:  FLEXERIL  Take 1 tablet (10 mg total) by mouth 3 (three) times daily as needed for muscle spasms.     famciclovir 500 MG tablet  Commonly known as:  FAMVIR  Take 1 tablet (500 mg total) by mouth daily.     fentaNYL 100 MCG/HR  Commonly known as:  DURAGESIC - dosed mcg/hr  Place 2 patches (200 mcg total) onto the skin every other day.     gabapentin 300 MG capsule   Commonly known as:  NEURONTIN  Take 2 capsules (600 mg total) by mouth 3 (three) times daily.     lidocaine-prilocaine cream  Commonly known as:  EMLA  Apply to affected area once     LORazepam 0.5 MG tablet  Commonly known as:  ATIVAN  Take 1 tablet (0.5 mg total) by mouth every 6 (six) hours as needed (Nausea or vomiting).     montelukast 10 MG tablet  Commonly known as:  SINGULAIR  Take 10 mg by mouth at bedtime.     morphine 30 MG tablet  Commonly known as:  MSIR  Take 1/2-1 pill, IF NEEDED, every 4 hrs for pain.     ondansetron 8 MG disintegrating tablet  Commonly known as:  ZOFRAN ODT  Take 1 tablet (8 mg total) by mouth every 8 (eight) hours as needed for nausea or vomiting.     pantoprazole 40 MG tablet  Commonly known as:  PROTONIX  Take 1 tablet (40 mg total) by mouth daily.     prochlorperazine 10 MG  tablet  Commonly known as:  COMPAZINE  Take 1 tablet (10 mg total) by mouth every 6 (six) hours as needed (Nausea or vomiting).       No Known Allergies     Follow-up Information    Follow up with [name], MD.   Specialty:  Oncology   Contact information:   [address], [address] [place] [address] [phone]        The results of significant diagnostics from this hospitalization (including imaging, microbiology, ancillary and laboratory) are listed below for reference.    Significant Diagnostic Studies: Dg Chest Port 1 View  07/15/2015  CLINICAL DATA:  Respiratory failure shortness of breath, multiple myeloma EXAM: PORTABLE CHEST 1 VIEW COMPARISON:  Portable chest x-ray of July 14, 2015 FINDINGS: The lungs are reasonably well inflated. There is a stable pleural based mass in the left lower hemi thorax. The heart is top-normal in size. The pulmonary vascularity is normal. The power port appliance tip projects at junction of the SVC with the right atrium. The bony thorax exhibits no acute abnormality. There are degenerative changes  of the left AC joint. IMPRESSION: Stable appearance of the chest since yesterday's study. Persistent pleural based mass in the left lower hemi thorax. Electronically Signed   By: David  Martinique M.D.   On: 07/15/2015 07:05   Dg Chest Port 1 View  07/14/2015  CLINICAL DATA:  Multiple myeloma. Hypercalcemia. Fatigue. Decreased p.o. intake. EXAM: PORTABLE CHEST - 1 VIEW COMPARISON:  One-view chest 06/24/2015 FINDINGS: The heart size is normal. The lung volumes are low. A right IJ Port-A-Cath is stable in position. A left lateral lung mass is stable. No other focal airspace disease is present. The lung volumes are low. The visualized soft tissues and bony thorax are unremarkable. IMPRESSION: 1. No acute abnormality or significant interval change. 2. Stable left lateral lung mass. 3. The Port-A-Cath is stable in position. Electronically Signed   By: San Morelle M.D.   On: 07/14/2015 16:01   Dg Chest Port 1 View  06/24/2015  CLINICAL DATA:  Stage IV multiple myeloma.  Fever for 5 days EXAM: PORTABLE CHEST 1 VIEW COMPARISON:  June 10, 2015 FINDINGS: There is again noted a mass arising from the pleura on the left, currently measuring approximately 4.9 x 4.0 cm. The lungs elsewhere are clear. No edema or consolidation. Heart size and pulmonary vascularity are normal. Lucencies in each lateral clavicle most likely are secondary to the known multiple myeloma. A lesion in the lateral right seventh rib is also likely due to multiple myeloma. Port-A-Cath tip is at the cavoatrial junction. No pneumothorax. Previous left-sided catheter is no longer appreciable. IMPRESSION: Mass in the periphery of the left lung again noted consistent with the known multiple myeloma. Bony changes of multiple myeloma are noted as well. No lung edema or consolidation. No change in cardiac silhouette. Electronically Signed   By: Lowella Grip III M.D.   On: 06/24/2015 18:34    Microbiology: Recent Results (from the past 240  hour(s))  Blood Culture (routine x 2)     Status: None (Preliminary result)   Collection Time: 07/14/15  3:25 PM  Result Value Ref Range Status   Specimen Description BLOOD PORTA CATH  Final   Special Requests BOTTLES DRAWN AEROBIC AND ANAEROBIC 5 ML  Final   Culture   Final    NO GROWTH 2 DAYS Performed at [facility]    Report Status PENDING  Incomplete  Blood Culture (  routine x 2)     Status: None (Preliminary result)   Collection Time: 07/14/15  3:50 PM  Result Value Ref Range Status   Specimen Description BLOOD RIGHT ANTECUBITAL  Final   Special Requests BOTTLES DRAWN AEROBIC AND ANAEROBIC 5 CC EA  Final   Culture   Final    NO GROWTH 2 DAYS Performed at [facility]    Report Status PENDING  Incomplete  Urine culture     Status: None   Collection Time: 07/14/15  4:33 PM  Result Value Ref Range Status   Specimen Description URINE, RANDOM  Final   Special Requests NONE  Final   Culture   Final    8,000 COLONIES/mL INSIGNIFICANT GROWTH Performed at [facility]    Report Status 07/16/2015 FINAL  Final  MRSA PCR Screening     Status: None   Collection Time: 07/15/15  3:09 AM  Result Value Ref Range Status   MRSA by PCR NEGATIVE NEGATIVE Final    Comment:        The GeneXpert MRSA Assay (FDA approved for NASAL specimens only), is one component of a comprehensive MRSA colonization surveillance program. It is not intended to diagnose MRSA infection nor to guide or monitor treatment for MRSA infections.      Labs: Basic Metabolic Panel:  Recent Labs Lab 07/14/15 2218 07/15/15 0410 07/16/15 0600 07/16/15 2010 07/17/15 0445  NA 142 142 145 148* 147*  K 4.1 3.9 4.1 4.4 4.2  CL 109 111 113* 117* 115*  CO2 _0 GLUCOSE 163* 177* 123* 123* 127*  BUN 24* _1 CREATININE 1.27* 1.21 1.30* 1.36* 1.30*  CALCIUM 14.6* 13.6* 13.6* 13.9* 14.0*  MG  --  1.2*  --   --  1.8  PHOS  --  1.8*  --   --  2.8   Liver Function  Tests:  Recent Labs Lab 07/13/15 1027 07/14/15 1250 07/14/15 1505 07/16/15 0600 07/17/15 0445  AST 33 28 38 30 36  ALT _2 16* 15*  ALKPHOS 74 86 85 65 78  BILITOT 1.30 0.92 1.3* 1.1 1.3*  PROT 6.6 6.9 7.1 5.5* 5.6*  ALBUMIN 2.9* 3.1* 3.5 2.5* 2.6*    Recent Labs Lab 07/14/15 1505  LIPASE 19   No results for input(s): AMMONIA in the last 168 hours. CBC:  Recent Labs Lab 07/10/15 1433 07/13/15 1027 07/14/15 1250 07/14/15 1505 07/15/15 0410 07/15/15 2038 07/16/15 0600  WBC 4.0 5.9 4.2 4.6 2.7* 2.8* 3.9*  NEUTROABS 2.4 4.0 2.6  --   --   --   --   HGB 9.5* 10.1* 10.4* 10.1* 8.0* 8.2* 7.9*  HCT 28.5* 30.8* 31.1* 30.4* 24.5* 25.5* 24.9*  MCV 85 84 83.6 83.5 84.5 85.9 86.2  PLT 7 Platelet count confirmed by slide estimate* <6* 37* 39* 28* 22* 18*   Cardiac Enzymes: No results for input(s): CKTOTAL, CKMB, CKMBINDEX, TROPONINI in the last 168 hours. BNP: BNP (last 3 results) No results for input(s): BNP in the last 8760 hours.  ProBNP (last 3 results) No results for input(s): PROBNP in the last 8760 hours.  CBG:  Recent Labs Lab 07/15/15 0831 07/15/15 1254 07/15/15 1701  GLUCAP 143* 117* 124*       Signed:  Dia Crawford, MD [facility] [phone] pager

## 2015-07-17 NOTE — Progress Notes (Signed)
[place] per MD for d/c of patient today to [facility] via EMS.  DC summary has been faxed to [facility] by LCSW Liaison. She will sign admit papers with daughter this morning and then EMS can be arranged. Nursing notified to call report.  No further CSW needs required once EMS is called.  Patient is oriented to person and place and will be notified of d/c.  CSW will sign off once above tasks are completed.  Lorie Phenix. Pauline Good, [place]

## 2015-07-17 NOTE — Care Management Note (Signed)
Case Management Note  Patient Details  Name: Madelyn Leet MRN: [id number] Date of Birth: 13-Apr-1971  Subjective/Objective:           Discharged to [facility] 12-16         Action/Plan:   Expected Discharge Date:   (unknown)               Expected Discharge Plan:  Home w Hospice Care  In-House Referral:  Hospice / Palliative Care, Clinical Social Work  Discharge planning Services  CM Consult  Post Acute Care Choice:    Choice offered to:     DME Arranged:    DME Agency:     HH Arranged:    [place] Agency:     Status of Service:  Completed, signed off  Medicare Important Message Given:    Date Medicare IM Given:    Medicare IM give by:    Date Additional Medicare IM Given:    Additional Medicare Important Message give by:     If discussed at [place] of Stay Meetings, dates discussed:    Additional Comments:  Vergie Living, RN 07/17/2015, 1:53 PM

## 2015-07-17 NOTE — Progress Notes (Signed)
CRITICAL VALUE ALERT  Critical value received:  Calcium 14  Date of notification:  07/17/2015   Time of notification:  0612  Critical value read back:Yes.    Nurse who received alert:  Reap, Jon Gills   MD notified (1st page):  TRH  Time of first page:  6:16 AM   MD notified (2nd page):  Time of second page:  Responding MD:    Time MD responded:

## 2015-07-19 LAB — CULTURE, BLOOD (ROUTINE X 2)
CULTURE: NO GROWTH
CULTURE: NO GROWTH

## 2015-07-19 NOTE — Progress Notes (Signed)
  Radiation Oncology         [phone] ________________________________  Name: Mark [name] MRN: [id number]  Date: 07/10/2015  DOB: 29-Oct-1970  End of Treatment Note   ICD-9-CM ICD-10-CM    1. 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   His treatments were stopped early in light of systemic progression and severe hypercalcemia.  Plan: the patient will be transferred to [facility] place for end of life measures.  -----------------------------------  Blair Promise, PhD, MD

## 2015-07-20 ENCOUNTER — Ambulatory Visit: Payer: BLUE CROSS/BLUE SHIELD

## 2015-07-20 ENCOUNTER — Other Ambulatory Visit: Payer: BLUE CROSS/BLUE SHIELD

## 2015-07-20 ENCOUNTER — Ambulatory Visit: Payer: BLUE CROSS/BLUE SHIELD | Admitting: Hematology & Oncology

## 2015-07-21 ENCOUNTER — Ambulatory Visit: Payer: BLUE CROSS/BLUE SHIELD

## 2015-07-21 ENCOUNTER — Telehealth: Payer: Self-pay | Admitting: *Deleted

## 2015-07-22 ENCOUNTER — Ambulatory Visit: Payer: BLUE CROSS/BLUE SHIELD

## 2015-07-23 ENCOUNTER — Other Ambulatory Visit: Payer: BLUE CROSS/BLUE SHIELD

## 2015-07-23 ENCOUNTER — Ambulatory Visit: Payer: BLUE CROSS/BLUE SHIELD | Admitting: Family

## 2015-07-23 ENCOUNTER — Ambulatory Visit: Payer: BLUE CROSS/BLUE SHIELD

## 2015-07-30 ENCOUNTER — Ambulatory Visit: Payer: BLUE CROSS/BLUE SHIELD

## 2015-07-30 ENCOUNTER — Ambulatory Visit: Payer: BLUE CROSS/BLUE SHIELD | Admitting: Family

## 2015-07-30 ENCOUNTER — Other Ambulatory Visit: Payer: BLUE CROSS/BLUE SHIELD

## 2015-08-02 NOTE — Telephone Encounter (Signed)
Received notification from [facility] that patient passed away today at 1304. Dr Marin Olp notified.

## 2015-08-02 DEATH — deceased

## 2015-08-04 ENCOUNTER — Other Ambulatory Visit: Payer: Self-pay | Admitting: Oncology

## 2015-08-06 ENCOUNTER — Ambulatory Visit: Payer: BLUE CROSS/BLUE SHIELD | Admitting: Family

## 2015-08-06 ENCOUNTER — Other Ambulatory Visit: Payer: BLUE CROSS/BLUE SHIELD

## 2015-08-06 ENCOUNTER — Ambulatory Visit: Payer: BLUE CROSS/BLUE SHIELD

## 2015-08-10 ENCOUNTER — Encounter: Payer: Self-pay | Admitting: Radiation Oncology

## 2015-08-10 NOTE — Progress Notes (Signed)
  Radiation Oncology         [phone] ________________________________  Name: Mark [name] MRN: [id number]  Date: 08/10/2015  DOB: Oct 25, 1970  End of Treatment Note  Diagnosis:   Multiple myeloma in relapse     Indication for treatment:  Painful cervical spine involvement       Radiation treatment dates:   07/01/2015 through 07/10/2015  Site/dose:   Cervical spine area at 12.5 gray in 5 fractions  Beams/energy:   Lateral fields, 10 x beams  Narrative: The patient tolerated radiation treatment relatively well.   He completed an abbreviated course of radiation therapy. The patient did require admission to the hospital for progressive myeloma with severe hypercalcemia. In light of progressive disease and lack of options patient's radiation therapy was stopped early.   Plan: The patient has completed radiation treatment. The patient will return to radiation oncology clinic for routine followup in one month. I advised them to call or return sooner if they have any questions or concerns related to their recovery or treatment.  -----------------------------------  Blair Promise, PhD, MD

## 2015-08-13 ENCOUNTER — Ambulatory Visit: Payer: BLUE CROSS/BLUE SHIELD | Admitting: Hematology & Oncology

## 2015-08-13 ENCOUNTER — Ambulatory Visit: Payer: BLUE CROSS/BLUE SHIELD

## 2015-08-13 ENCOUNTER — Other Ambulatory Visit: Payer: BLUE CROSS/BLUE SHIELD

## 2016-01-20 ENCOUNTER — Other Ambulatory Visit: Payer: Self-pay | Admitting: Nurse Practitioner

## 2016-03-22 IMAGING — CR DG CHEST 2V
2 series · 2 of 2 positions shown · non-contrast
Comparison: Two-view chest x-ray 03/06/2015

CLINICAL DATA: Shortness breath.  Chest pain.  Multiple myeloma.

EXAM:
CHEST - 2 VIEW

[w chest pa]
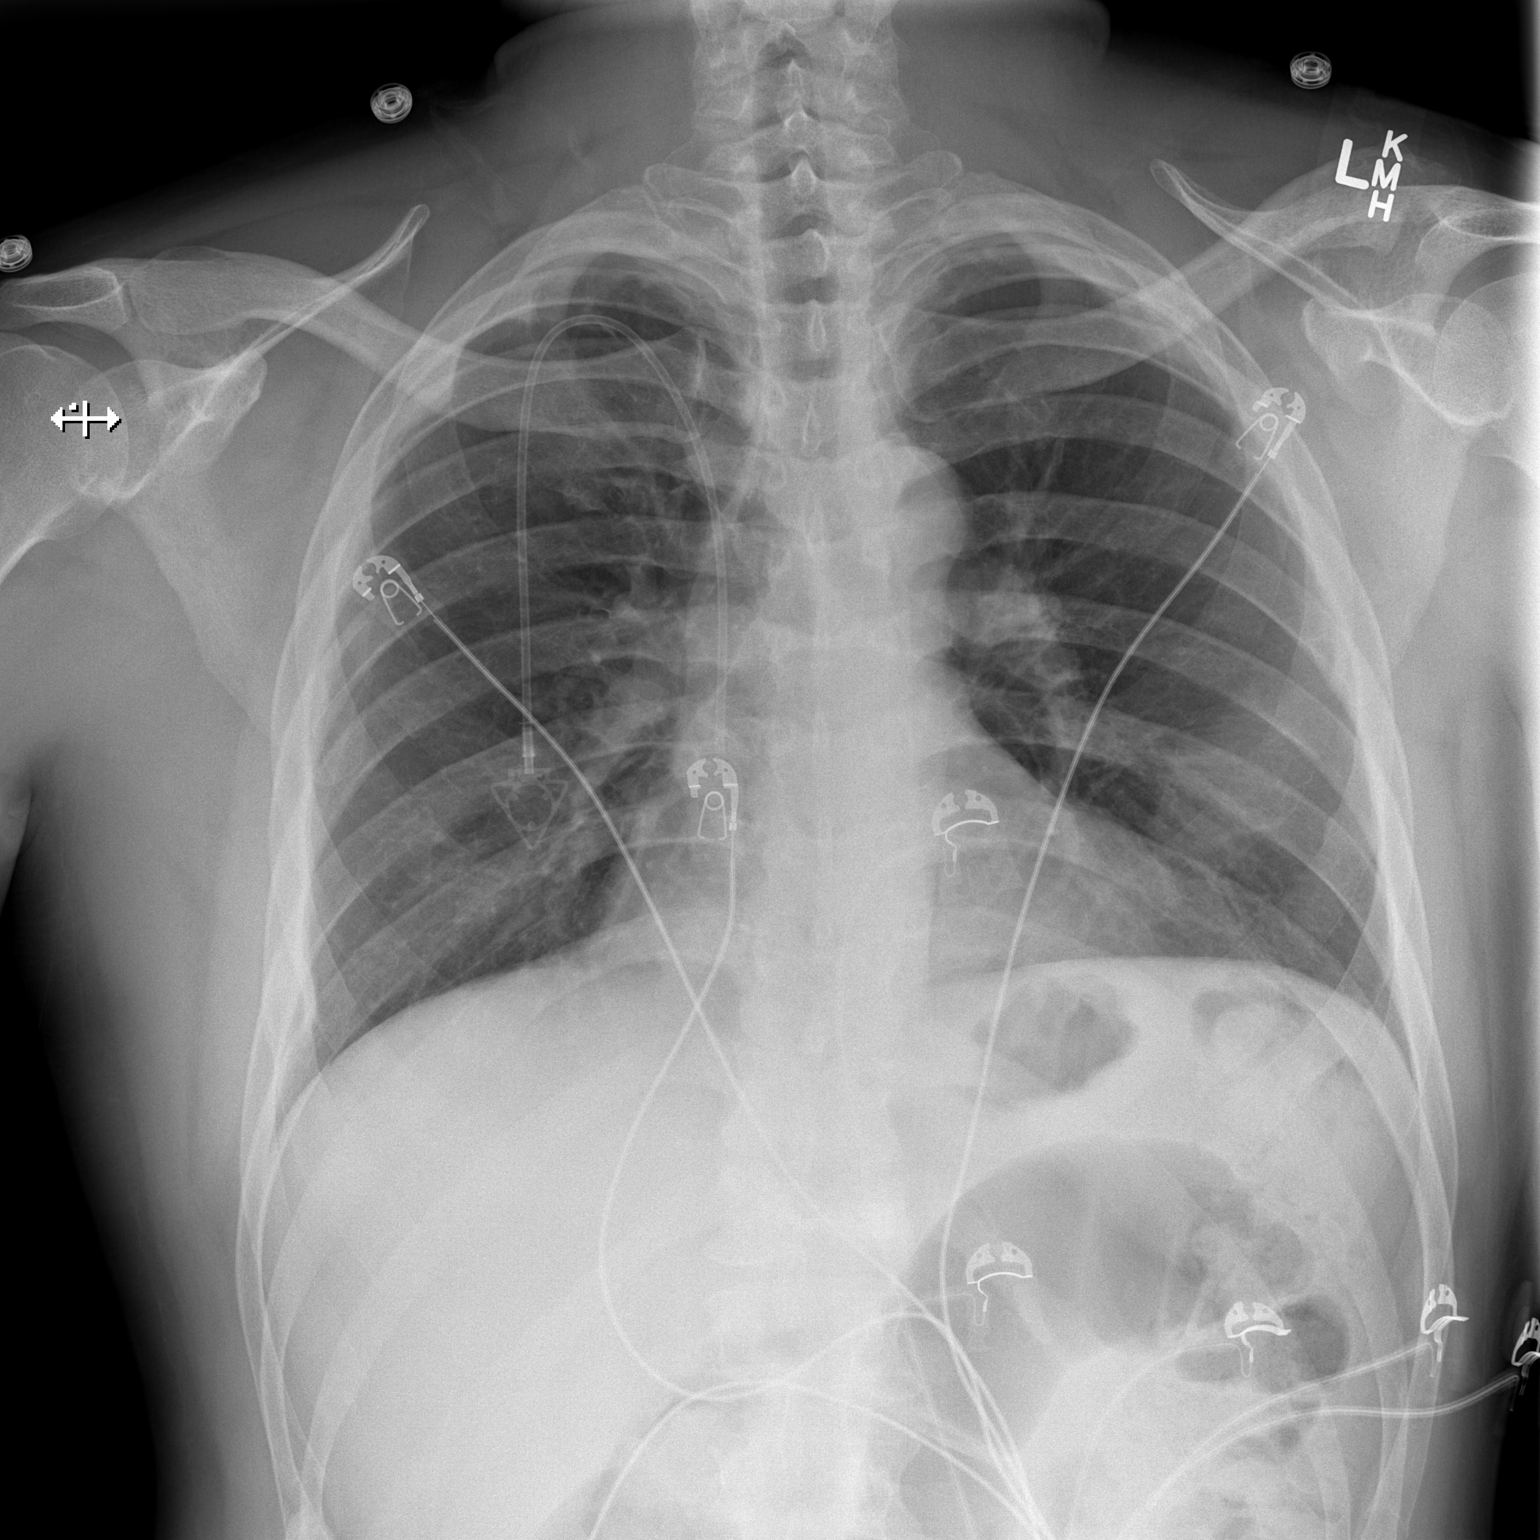

[w chest lat]
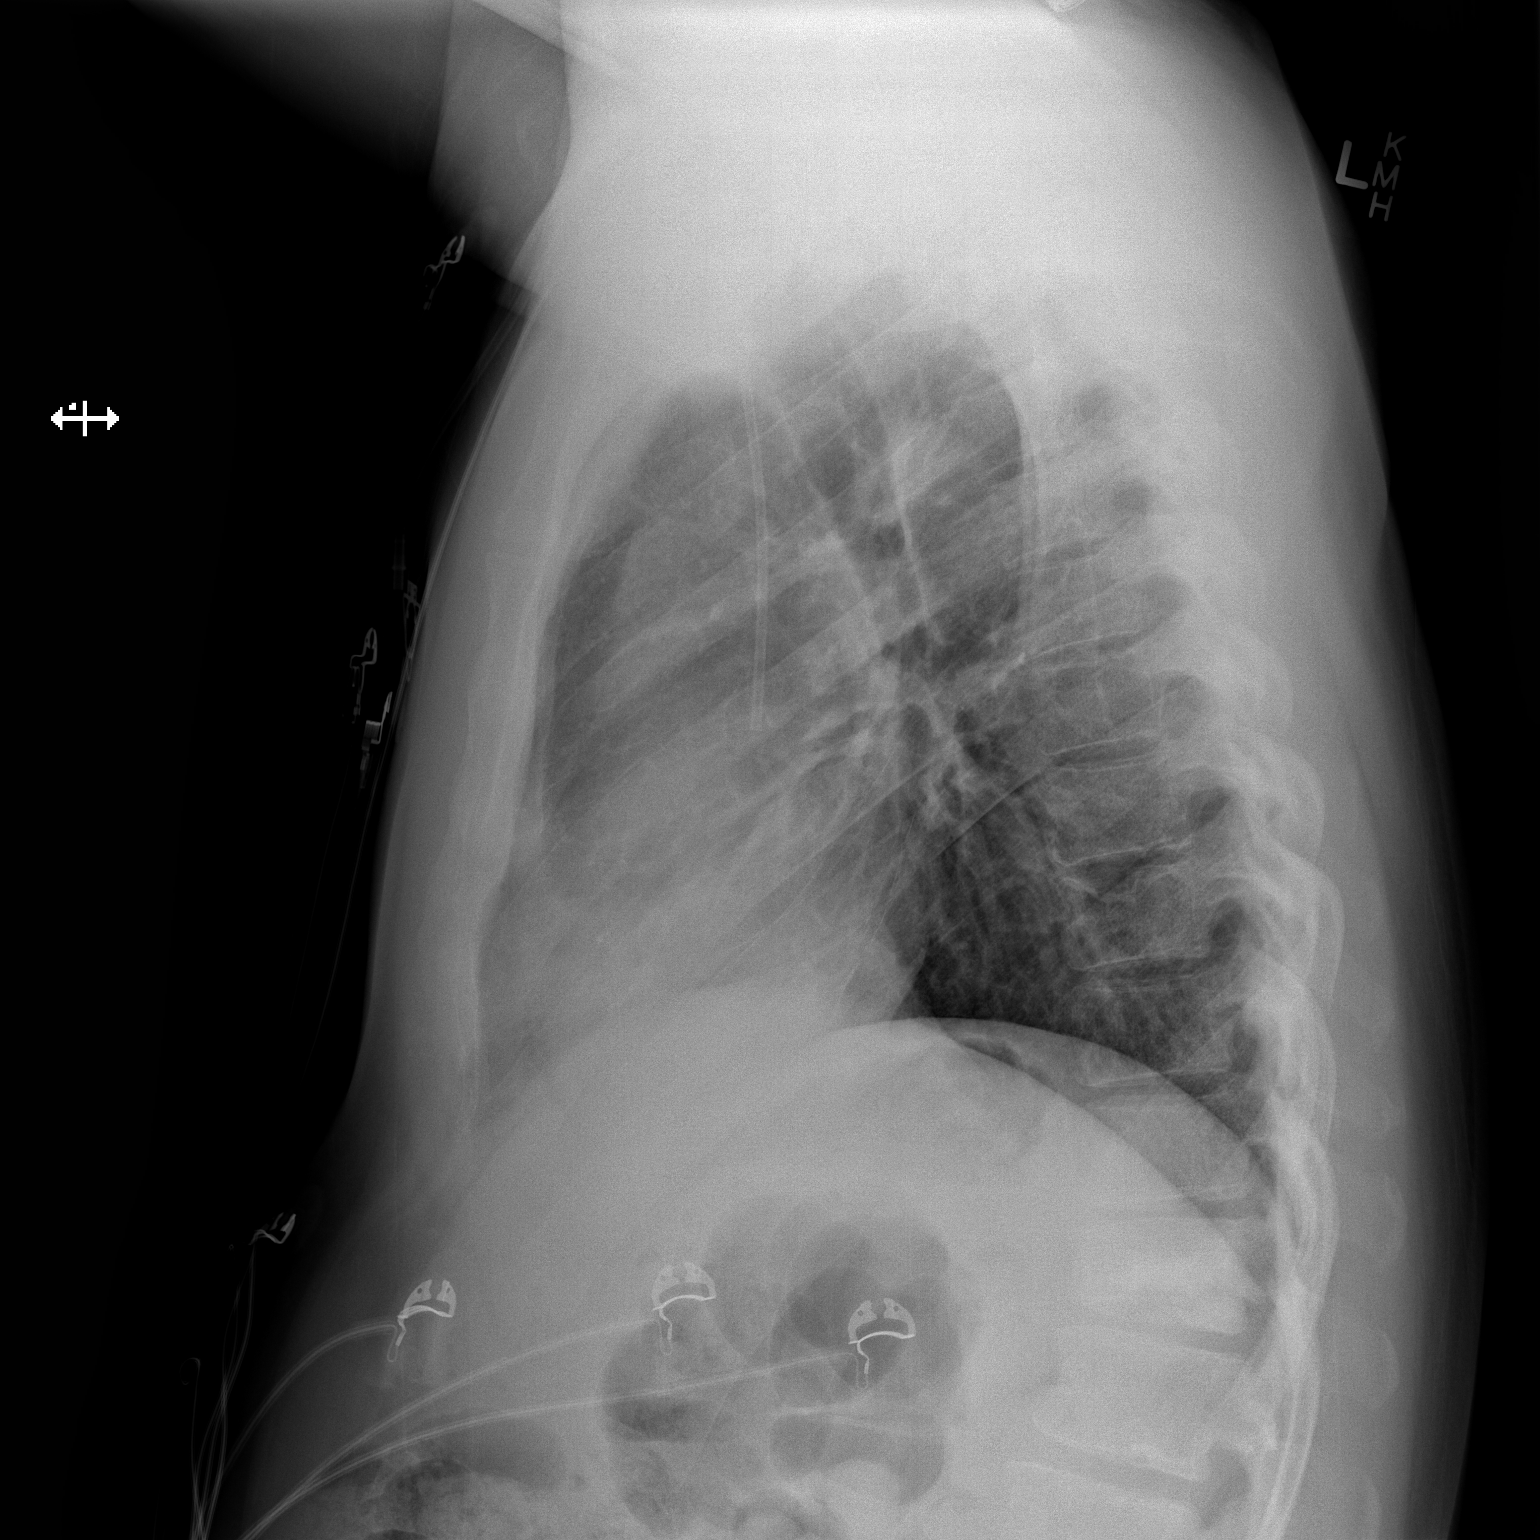

[2 of 2 positions shown; findings below may reference images not displayed]

FINDINGS: Heart size is normal. A right IJ Port-A-Cath is stable. The lung
volumes are somewhat low. No focal airspace disease is present.
There is no edema or effusion to suggest failure. The visualized
soft tissues are within normal limits. Focal kyphosis in the lower
thoracic spine is stable.
IMPRESSION: 1. No acute cardiopulmonary disease or significant interval change.
2. Persistent low lung volumes.
3. Stable right IJ Port-A-Cath.

## 2016-05-20 IMAGING — DX DG CHEST 2V
2 series · 2 of 2 positions shown · non-contrast
Comparison: Chest x-ray of 05/02/2015

CLINICAL DATA: Cough, shortness of breath, history of multiple
myeloma

EXAM:
CHEST  2 VIEW

[chest pa]
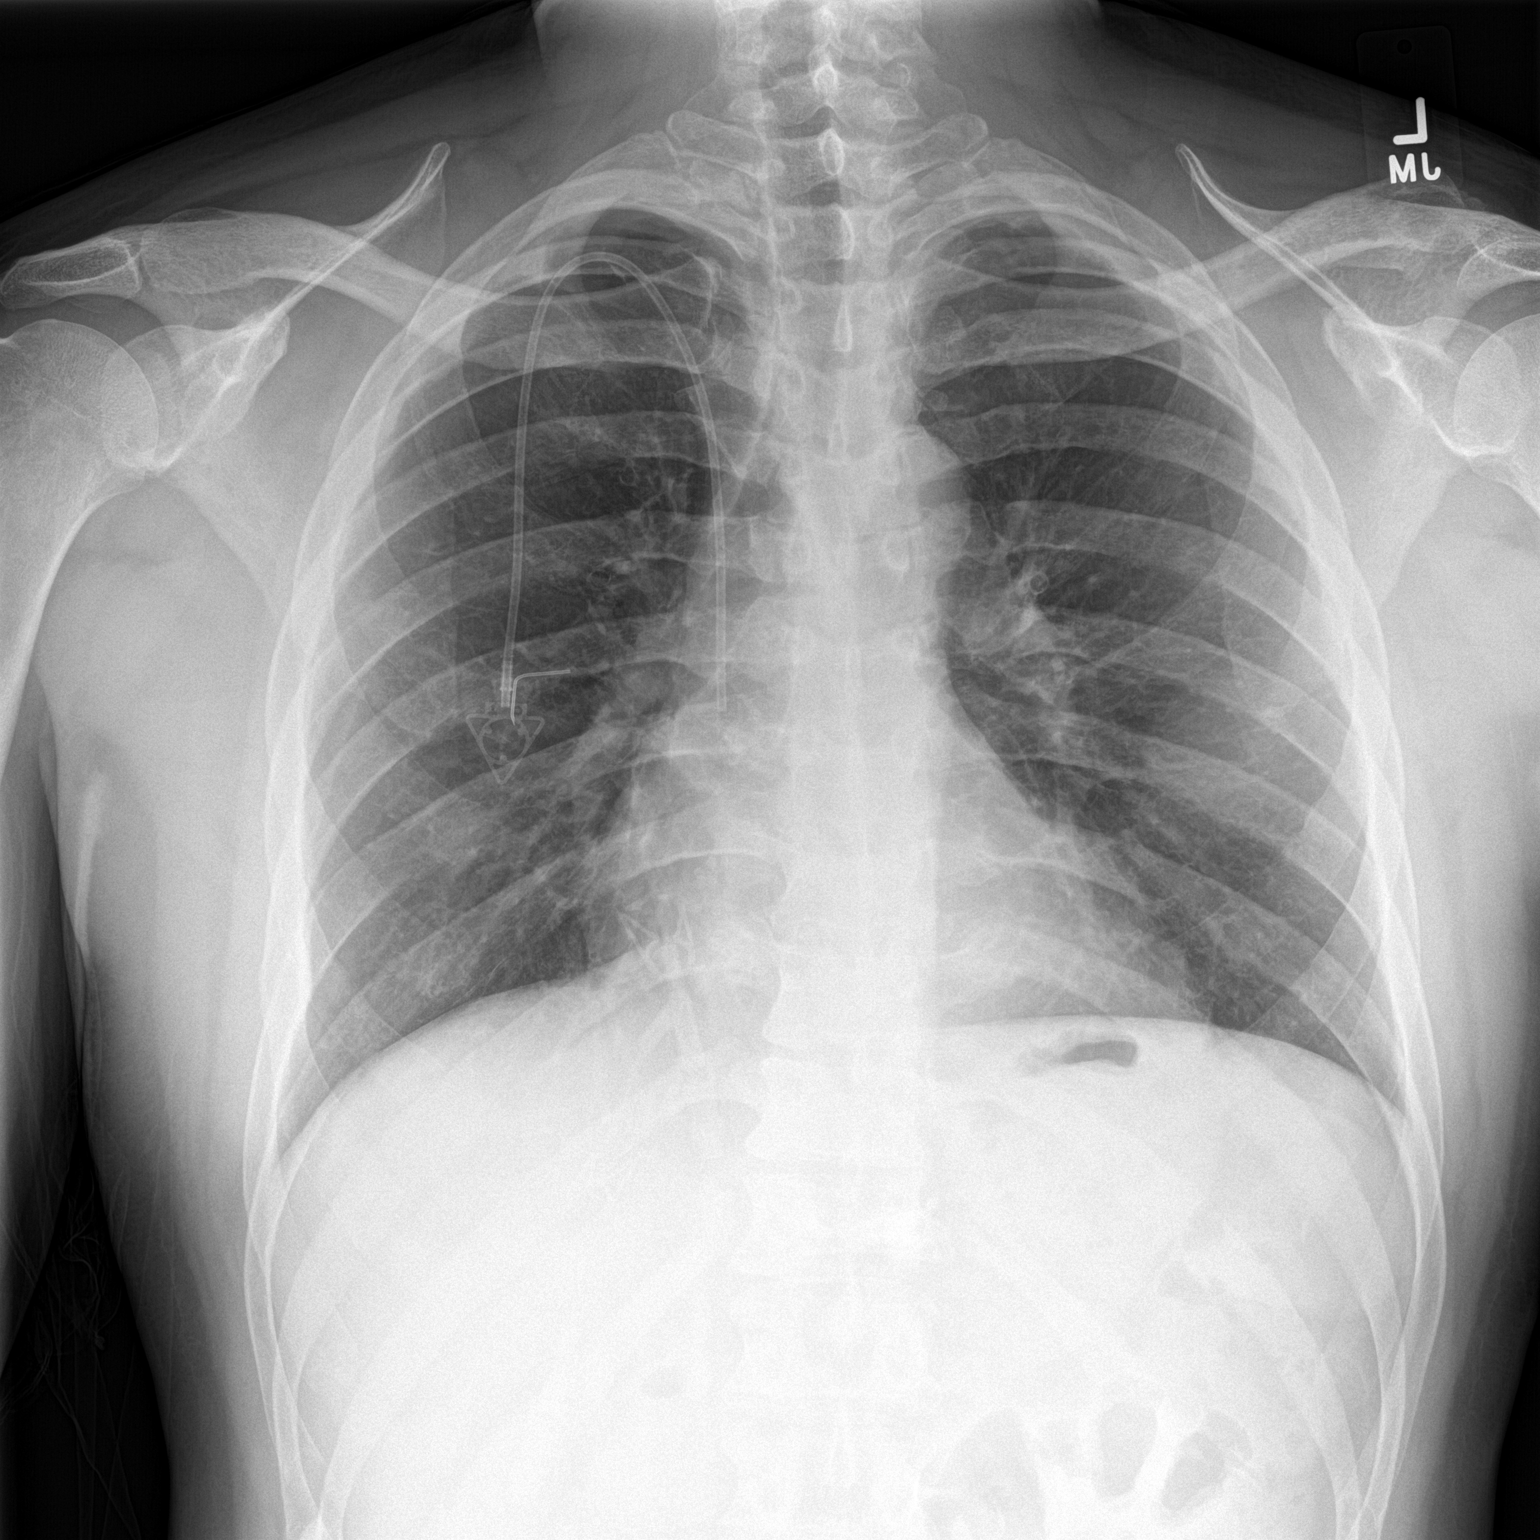

[chest lat]
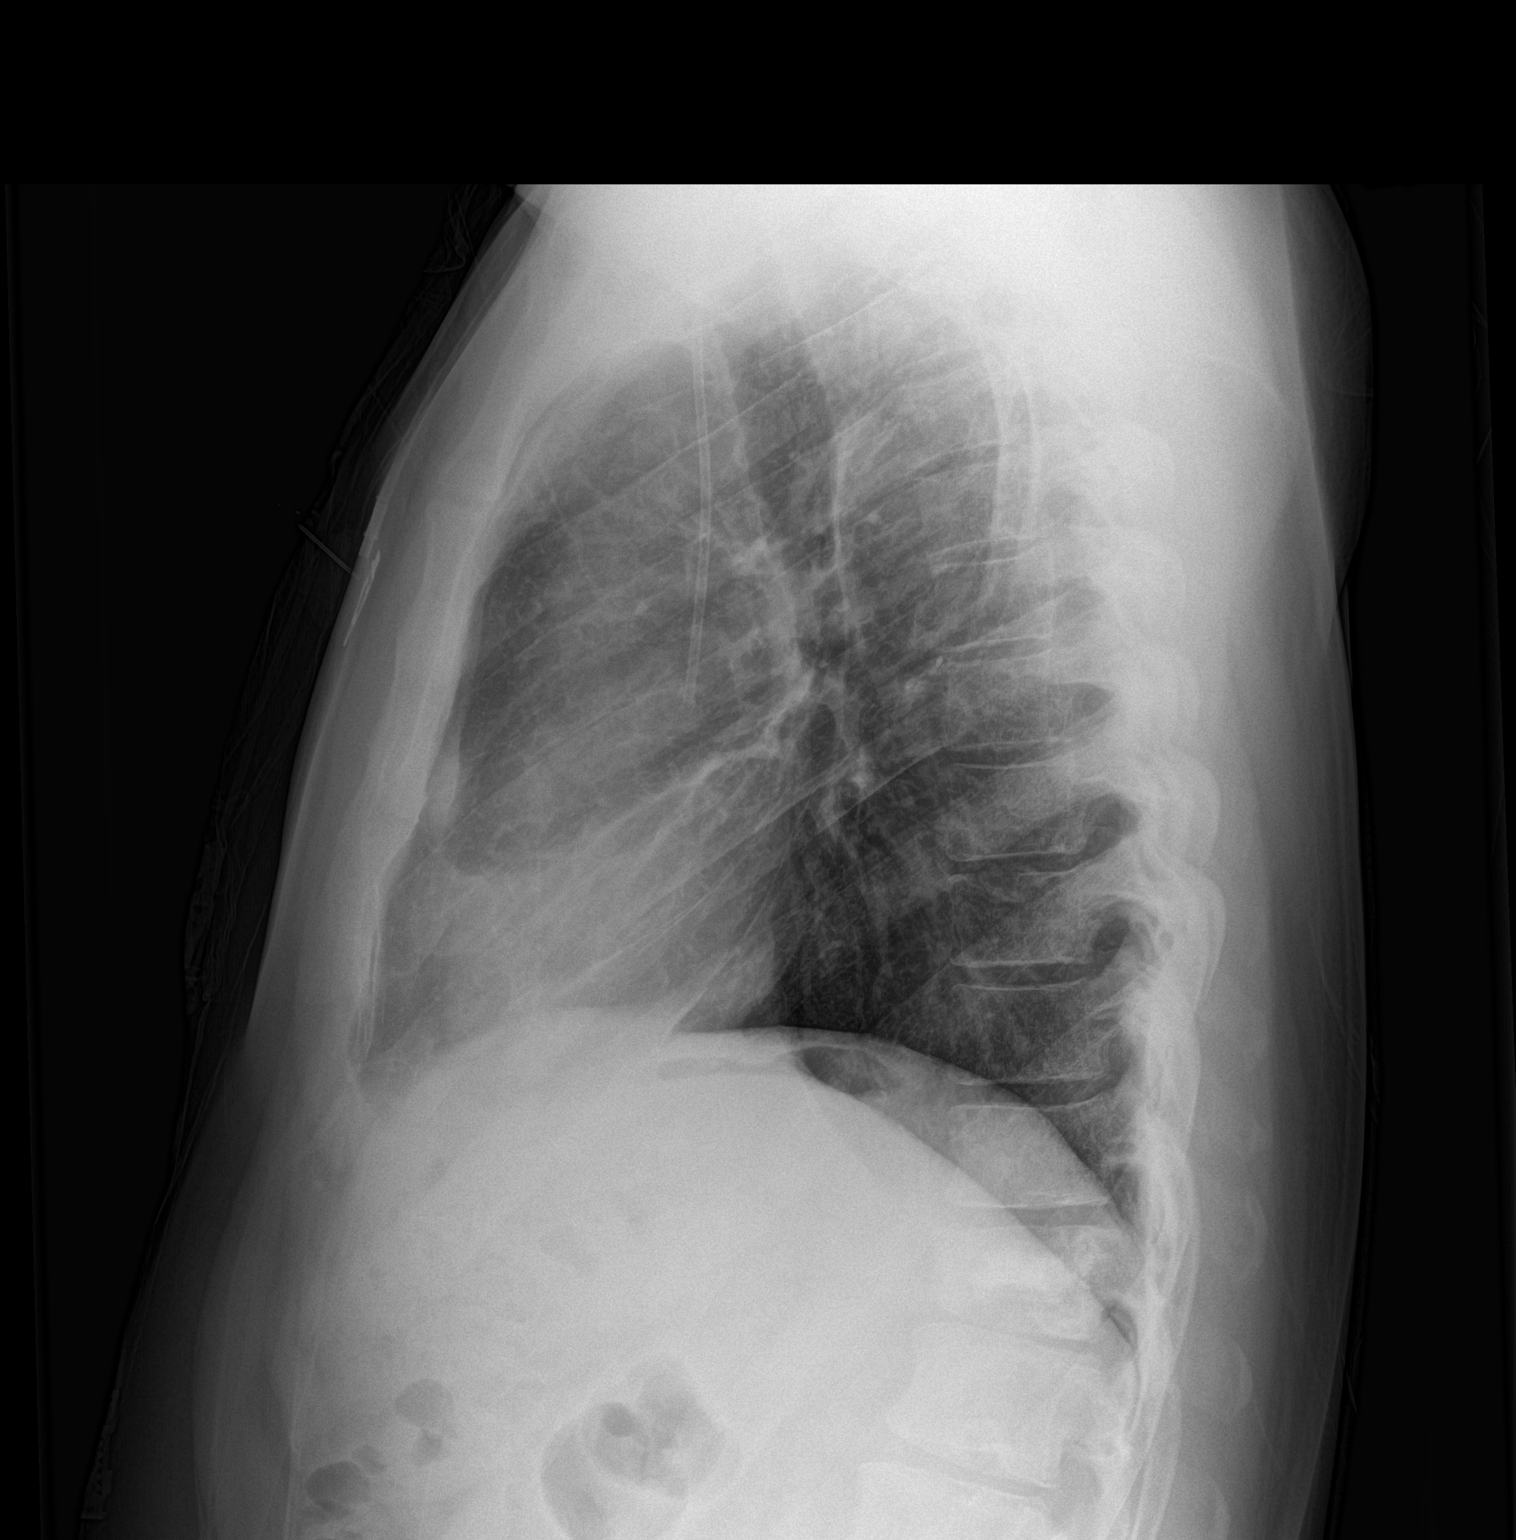

[2 of 2 positions shown; findings below may reference images not displayed]

FINDINGS: There is little change in the previously described pleural lesion at
the level of the anterior left fourth rib. No infiltrate or effusion
is seen. Mediastinal and hilar contours are stable. A right-sided
Port-A-Cath remains with the tip in the lower SVC. The heart is
unchanged in size. No bony abnormality is seen. Apparent
vertebroplasty of T12 is unchanged.
IMPRESSION: 1. No active infiltrate or effusion.
2. Stable appearance of pleural soft tissue mass at the level of the
anterior left fourth rib previously described.
3. Right-sided Port-A-Cath tip is noted within the lower SVC,
unchanged in position.

## 2016-07-02 IMAGING — CR DG CHEST 1V PORT
1 series · 1 of 1 positions shown · non-contrast
Comparison: June 10, 2015

CLINICAL DATA: Stage IV multiple myeloma.  Fever for 5 days

EXAM:
PORTABLE CHEST 1 VIEW

[AP]
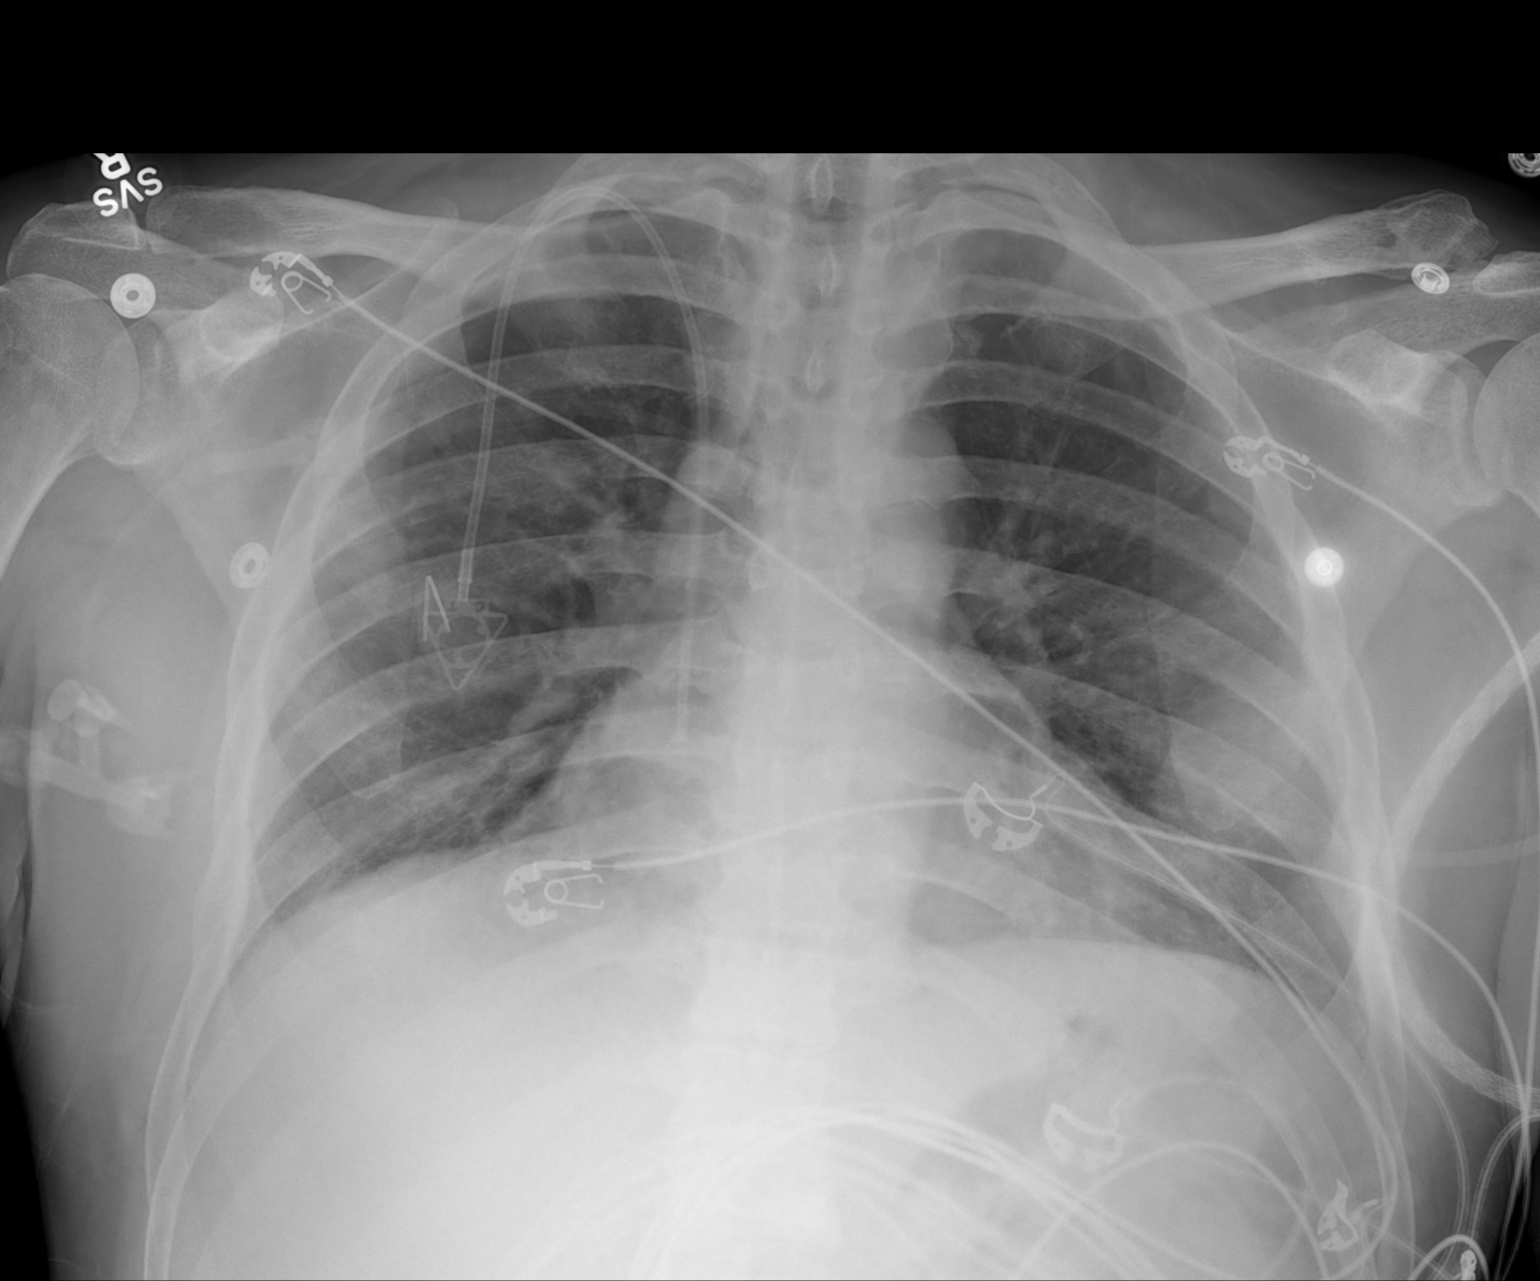

[1 of 1 positions shown; findings below may reference images not displayed]

FINDINGS: There is again noted a mass arising from the pleura on the left,
currently measuring approximately 4.9 x 4.0 cm. The lungs elsewhere
are clear. No edema or consolidation. Heart size and pulmonary
vascularity are normal. Lucencies in each lateral clavicle most
likely are secondary to the known multiple myeloma. A lesion in the
lateral right seventh rib is also likely due to multiple myeloma.
Port-A-Cath tip is at the cavoatrial junction. No pneumothorax.
Previous left-sided catheter is no longer appreciable.
IMPRESSION: Mass in the periphery of the left lung again noted consistent with
the known multiple myeloma. Bony changes of multiple myeloma are
noted as well. No lung edema or consolidation. No change in cardiac
silhouette.
